# Patient Record
Sex: Male | Born: 1935 | Race: White | Hispanic: No | Smoking: Former smoker
Health system: Southern US, Community
[De-identification: ages and names within clinical notes are randomized; demographics above are authoritative.]

## PROBLEM LIST (undated history)

## (undated) DIAGNOSIS — R310 Gross hematuria: Secondary | ICD-10-CM

## (undated) DIAGNOSIS — G7000 Myasthenia gravis without (acute) exacerbation: Secondary | ICD-10-CM

## (undated) DIAGNOSIS — E7800 Pure hypercholesterolemia, unspecified: Secondary | ICD-10-CM

## (undated) DIAGNOSIS — R42 Dizziness and giddiness: Secondary | ICD-10-CM

## (undated) DIAGNOSIS — R9389 Abnormal findings on diagnostic imaging of other specified body structures: Secondary | ICD-10-CM

## (undated) DIAGNOSIS — I10 Essential (primary) hypertension: Secondary | ICD-10-CM

## (undated) DIAGNOSIS — B019 Varicella without complication: Secondary | ICD-10-CM

## (undated) DIAGNOSIS — C679 Malignant neoplasm of bladder, unspecified: Secondary | ICD-10-CM

## (undated) DIAGNOSIS — I719 Aortic aneurysm of unspecified site, without rupture: Secondary | ICD-10-CM

## (undated) DIAGNOSIS — B029 Zoster without complications: Secondary | ICD-10-CM

## (undated) DIAGNOSIS — G7080 Lambert-Eaton syndrome, unspecified: Secondary | ICD-10-CM

## (undated) DIAGNOSIS — I959 Hypotension, unspecified: Secondary | ICD-10-CM

## (undated) DIAGNOSIS — I340 Nonrheumatic mitral (valve) insufficiency: Secondary | ICD-10-CM

## (undated) DIAGNOSIS — N411 Chronic prostatitis: Secondary | ICD-10-CM

## (undated) DIAGNOSIS — I509 Heart failure, unspecified: Secondary | ICD-10-CM

## (undated) DIAGNOSIS — I071 Rheumatic tricuspid insufficiency: Secondary | ICD-10-CM

## (undated) DIAGNOSIS — I6523 Occlusion and stenosis of bilateral carotid arteries: Secondary | ICD-10-CM

## (undated) DIAGNOSIS — D1722 Benign lipomatous neoplasm of skin and subcutaneous tissue of left arm: Secondary | ICD-10-CM

## (undated) DIAGNOSIS — T7840XA Allergy, unspecified, initial encounter: Secondary | ICD-10-CM

## (undated) DIAGNOSIS — I209 Angina pectoris, unspecified: Secondary | ICD-10-CM

## (undated) DIAGNOSIS — K635 Polyp of colon: Secondary | ICD-10-CM

## (undated) DIAGNOSIS — R972 Elevated prostate specific antigen [PSA]: Secondary | ICD-10-CM

## (undated) DIAGNOSIS — C801 Malignant (primary) neoplasm, unspecified: Secondary | ICD-10-CM

## (undated) DIAGNOSIS — I2510 Atherosclerotic heart disease of native coronary artery without angina pectoris: Secondary | ICD-10-CM

## (undated) DIAGNOSIS — D649 Anemia, unspecified: Secondary | ICD-10-CM

## (undated) HISTORY — DX: Aortic aneurysm of unspecified site, without rupture: I71.9

## (undated) HISTORY — DX: Angina pectoris, unspecified: I20.9

## (undated) HISTORY — DX: Zoster without complications: B02.9

## (undated) HISTORY — DX: Myasthenia gravis without (acute) exacerbation: G70.00

## (undated) HISTORY — DX: Malignant (primary) neoplasm, unspecified: C80.1

## (undated) HISTORY — DX: Malignant neoplasm of bladder, unspecified: C67.9

## (undated) HISTORY — DX: Allergy, unspecified, initial encounter: T78.40XA

## (undated) HISTORY — DX: Elevated prostate specific antigen (PSA): R97.20

## (undated) HISTORY — DX: Varicella without complication: B01.9

## (undated) HISTORY — DX: Polyp of colon: K63.5

## (undated) HISTORY — DX: Essential (primary) hypertension: I10

## (undated) HISTORY — DX: Chronic prostatitis: N41.1

## (undated) HISTORY — DX: Lambert-Eaton syndrome, unspecified: G70.80

## (undated) HISTORY — DX: Pure hypercholesterolemia, unspecified: E78.00

## (undated) HISTORY — DX: Benign lipomatous neoplasm of skin and subcutaneous tissue of left arm: D17.22

## (undated) HISTORY — DX: Hypotension, unspecified: I95.9

## (undated) HISTORY — DX: Rheumatic tricuspid insufficiency: I07.1

## (undated) HISTORY — DX: Dizziness and giddiness: R42

## (undated) HISTORY — DX: Occlusion and stenosis of bilateral carotid arteries: I65.23

## (undated) HISTORY — DX: Atherosclerotic heart disease of native coronary artery without angina pectoris: I25.10

## (undated) HISTORY — DX: Nonrheumatic mitral (valve) insufficiency: I34.0

## (undated) HISTORY — DX: Anemia, unspecified: D64.9

## (undated) HISTORY — DX: Heart failure, unspecified: I50.9

## (undated) HISTORY — DX: Gross hematuria: R31.0

## (undated) HISTORY — DX: Abnormal findings on diagnostic imaging of other specified body structures: R93.89

---
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
## 1968-09-29 HISTORY — PX: HERNIA REPAIR: SHX51

## 1977-09-29 HISTORY — PX: CHOLECYSTECTOMY: SHX55

## 2004-12-10 ENCOUNTER — Ambulatory Visit: Payer: Self-pay | Admitting: Internal Medicine

## 2005-06-13 ENCOUNTER — Ambulatory Visit: Payer: Self-pay | Admitting: Internal Medicine

## 2006-04-02 ENCOUNTER — Ambulatory Visit: Payer: Self-pay | Admitting: Internal Medicine

## 2006-04-06 ENCOUNTER — Ambulatory Visit: Payer: Self-pay | Admitting: Gastroenterology

## 2011-03-31 DIAGNOSIS — G7000 Myasthenia gravis without (acute) exacerbation: Secondary | ICD-10-CM

## 2011-03-31 DIAGNOSIS — G7080 Lambert-Eaton syndrome, unspecified: Secondary | ICD-10-CM | POA: Insufficient documentation

## 2011-03-31 HISTORY — DX: Myasthenia gravis without (acute) exacerbation: G70.00

## 2011-04-02 DIAGNOSIS — I209 Angina pectoris, unspecified: Secondary | ICD-10-CM

## 2011-04-02 DIAGNOSIS — I10 Essential (primary) hypertension: Secondary | ICD-10-CM | POA: Insufficient documentation

## 2011-04-02 HISTORY — DX: Angina pectoris, unspecified: I20.9

## 2011-04-02 HISTORY — DX: Essential (primary) hypertension: I10

## 2011-09-30 DIAGNOSIS — B029 Zoster without complications: Secondary | ICD-10-CM

## 2011-09-30 HISTORY — DX: Zoster without complications: B02.9

## 2011-11-04 ENCOUNTER — Ambulatory Visit: Payer: Self-pay | Admitting: Gastroenterology

## 2011-11-05 ENCOUNTER — Ambulatory Visit: Payer: Self-pay | Admitting: Gastroenterology

## 2011-11-28 DIAGNOSIS — B029 Zoster without complications: Secondary | ICD-10-CM

## 2011-11-28 HISTORY — DX: Zoster without complications: B02.9

## 2012-06-22 LAB — LIPID PANEL: Triglycerides: 153 mg/dL (ref 40–160)

## 2012-08-30 ENCOUNTER — Encounter: Payer: Self-pay | Admitting: Internal Medicine

## 2012-08-30 ENCOUNTER — Ambulatory Visit: Payer: Self-pay | Admitting: Internal Medicine

## 2012-08-30 ENCOUNTER — Ambulatory Visit (INDEPENDENT_AMBULATORY_CARE_PROVIDER_SITE_OTHER): Payer: Medicare Other | Admitting: Internal Medicine

## 2012-08-30 VITALS — BP 120/72 | HR 71 | Temp 98.0°F | Ht 68.5 in | Wt 199.8 lb

## 2012-08-30 DIAGNOSIS — R5381 Other malaise: Secondary | ICD-10-CM

## 2012-08-30 DIAGNOSIS — R300 Dysuria: Secondary | ICD-10-CM

## 2012-08-30 DIAGNOSIS — R5383 Other fatigue: Secondary | ICD-10-CM

## 2012-08-30 DIAGNOSIS — D649 Anemia, unspecified: Secondary | ICD-10-CM | POA: Insufficient documentation

## 2012-08-30 DIAGNOSIS — G7080 Lambert-Eaton syndrome, unspecified: Secondary | ICD-10-CM

## 2012-08-30 DIAGNOSIS — E7800 Pure hypercholesterolemia, unspecified: Secondary | ICD-10-CM | POA: Insufficient documentation

## 2012-08-30 HISTORY — DX: Lambert-Eaton syndrome, unspecified: G70.80

## 2012-08-30 LAB — COMPREHENSIVE METABOLIC PANEL
AST: 19 U/L (ref 0–37)
Albumin: 3.9 g/dL (ref 3.5–5.2)
BUN: 16 mg/dL (ref 6–23)
Calcium: 8.9 mg/dL (ref 8.4–10.5)
Chloride: 101 mEq/L (ref 96–112)
Creatinine, Ser: 0.9 mg/dL (ref 0.4–1.5)
GFR: 84.37 mL/min (ref >60.00)
Glucose, Bld: 79 mg/dL (ref 70–99)
Potassium: 4 mEq/L (ref 3.5–5.1)

## 2012-08-30 LAB — POCT URINALYSIS DIPSTICK
Bilirubin, UA: NEGATIVE
Glucose, UA: NEGATIVE
Nitrite, UA: NEGATIVE
Spec Grav, UA: 1.025

## 2012-08-30 LAB — CBC WITH DIFFERENTIAL/PLATELET
Basophils Relative: 0.8 % (ref 0.0–3.0)
Eosinophils Absolute: 0.1 10*3/uL (ref 0.0–0.7)
Eosinophils Relative: 1.5 % (ref 0.0–5.0)
Hemoglobin: 14 g/dL (ref 13.0–17.0)
Lymphocytes Relative: 15.1 % (ref 12.0–46.0)
MCHC: 32.3 g/dL (ref 30.0–36.0)
MCV: 90.8 fl (ref 78.0–100.0)
Neutro Abs: 4.3 10*3/uL (ref 1.4–7.7)
RBC: 4.76 Mil/uL (ref 4.22–5.81)
WBC: 5.7 10*3/uL (ref 4.5–10.5)

## 2012-08-30 MED ORDER — CIPROFLOXACIN HCL 500 MG PO TABS: 500.0000 mg | ORAL_TABLET | Freq: Two times a day (BID) | ORAL | Status: DC

## 2012-08-30 NOTE — Assessment & Plan Note (Signed)
Low cholesterol diet and exercise.  Not on cholesterol medication now.  Follow.     

## 2012-08-30 NOTE — Assessment & Plan Note (Signed)
Sees Dr Howard.  Continue Mestinon and Cellcept.  Follow cbc.  Currently stable.   

## 2012-08-30 NOTE — Progress Notes (Signed)
Subjective:    Patient ID: [name], male    DOB: March 14, 1926, 78 y.o.   MRN: [id number]  HPI 78 year old male with past history of hypercholesterolemia, Eaton-Lamberts with Myasthenia Gravis and anemia who comes in today to follow up on these issues as well as for a complete physical exam.  He states he is doing relatively well.  Still seeing Dr Dorcas Mcmurray.  Using prednisone eye drops.  Had shingles involving his eye.  Is gradually improving.  Has follow up with him 10/05/12.  States that over the last 2-3 weeks, he has noticed some lower abdominal discomfort and burning with urination.  No fever or chills.  No nausea or vomiting.  No bowel change.  The discomfort is better, but he is still having the dysuria.    Past Medical History  Diagnosis Date  . Hypercholesterolemia   . Anemia   . Essential hypertension, benign   . Eaton-Lambert syndrome   . Myasthenia gravis   . Chicken pox   . Allergy   . Arthritis   . Urine incontinence   . Colon polyp     Review of Systems Patient denies any headache, lightheadedness or dizziness.  No significant sinus or allergy symptoms.  No chest pain, tightness or palpitations.  No increased shortness of breath, cough or congestion.  No nausea or vomiting.  No significant abdominal pain or cramping currently.  No bowel change, such as diarrhea, constipation, BRBPR or melana.  Does report dysuria as outlined.  No hematuria.  No urinary hesitancy.          Objective:   Physical Exam Filed Vitals:   08/30/12 0908  BP: 120/72  Pulse: 71  Temp: 98 F (55.28 C)   78 year old male in no acute distress.  HEENT:  Nares - clear.  Oropharynx - without lesions. NECK:  Supple.  Nontender.  No audible carotid bruit.  HEART:  Appears to be regular.   LUNGS:  No crackles or wheezing audible.  Respirations even and unlabored.   RADIAL PULSE:  Equal bilaterally.  ABDOMEN:  Soft.  Nontender.  Bowel sounds present and normal.  No audible abdominal bruit.     RECTAL:  Could not appreciate any palpable prostate nodules.  Prostate enlarged, minimal tenderness.  Heme negative.   EXTREMITIES:  No increased edema present.  DP pulses palpable and equal bilaterally.          Assessment & Plan:  GU.  Sees Dr Achilles Dunk.  Had the previous episode of hematuria.  Occurred on one occasion.  Sees Dr Achilles Dunk regularly.  Last u/a negative.  Dr Achilles Dunk following PSA and prostate checks.  With the dysuria and discomfort and exam findings - concerned about possible prostatitis.  Will check u/a.  Treat with cipro 500mg  bid x 10 days.  Follow closely.  Probiotic while on the abx.    CARDIOVASCULAR.  Stress test 01/18/09 revealed no evidence of ischemia.  EF 48%.  Frequent PVCs.  ECHO 01/18/09 revealed EF 50% with mild biatrial enlargement and mitral and tricuspid insufficiency.  Currently asymptomatic.    PULMONARY.  01/09/09 CXR revealed no acute infiltrate.  Scar at the left base.  Breathing has been stable.  Have discussed w/up for sleep apnea.  He declines.  Discussed with him regarding follow up CXR.  He wants to hold at this point.  Follow.    OPTHALMOLOGY.  Had shingles that involved his eye.  Seeing Dr Dorcas Mcmurray.  Using steroid eye drops.  Has  improved.  Continue follow up with Dr Dorcas Mcmurray.   HEALTH MAINTENANCE.  Physical today.  GU and prostate checks are done through Dr Cope's office.  Colonoscopy 11/05/11 revealed two hyperplastic rectal polyps and diverticulosis.

## 2012-08-30 NOTE — Assessment & Plan Note (Signed)
Check cbc to confirm normal/stable.

## 2012-08-30 NOTE — Patient Instructions (Signed)
It was nice seeing you today.  I am going to check your urine and some labs today - given the increased fatigue.  I am also going to start an antibiotic (cipro) - take twice a day - for prostate infection.  Let me know if you have persistent problems.

## 2012-09-15 ENCOUNTER — Telehealth: Payer: Self-pay | Admitting: Internal Medicine

## 2012-09-15 NOTE — Telephone Encounter (Signed)
Patient Information:  Caller Name: Jerrye Beavers  Phone: [phone]  Patient: [name], [name]  Gender: Male  DOB: 1926/08/12  Age: 78 Years  PCP: Dale Braden  Office Follow Up:  Does the office need to follow up with this patient?: Yes  Instructions For The Office: wants to talk to MD  RN Note:  Denies any trauma.  He is outside working today.  Just concerned that after every urination he has a bloody spot on his underwear.  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  Symptoms  Reason For Call & Symptoms: Has blood in his underwear after urinating only.  None seen during or after urination or a bm.  Has fatigue.  He completed medication for a UTI and completed antibiotics for same.  Reviewed Health History In EMR: Yes  Reviewed Medications In EMR: Yes  Reviewed Allergies In EMR: Yes  Reviewed Surgeries / Procedures: Yes  Date of Onset of Symptoms: 09/13/2012  Guideline(s) Used:  Urination Pain - Male  Disposition Per Guideline:   See Today in Office  Reason For Disposition Reached:   All other males with painful urination, or patient wants to be seen  Advice Given:  Fluids  : Drink extra fluids (Reason: to produce a dilute, nonirritating urine).  Call Back If:  You become worse.  Patient Refused Recommendation:  Patient Will Follow Up With Office Later  Wants to speak with Dr. Lorin Picket personally.

## 2012-09-15 NOTE — Telephone Encounter (Signed)
Still having problems with prostate and also having pain with blood in underpants. Finished antibiotics last week. Willing to see urinologist. Told patient we would call he back.

## 2012-09-16 ENCOUNTER — Encounter: Payer: Self-pay | Admitting: Internal Medicine

## 2012-09-16 ENCOUNTER — Ambulatory Visit (INDEPENDENT_AMBULATORY_CARE_PROVIDER_SITE_OTHER): Payer: Medicare Other | Admitting: Internal Medicine

## 2012-09-16 VITALS — BP 130/70 | HR 57 | Ht 68.5 in | Wt 197.5 lb

## 2012-09-16 DIAGNOSIS — R109 Unspecified abdominal pain: Secondary | ICD-10-CM

## 2012-09-16 DIAGNOSIS — R319 Hematuria, unspecified: Secondary | ICD-10-CM

## 2012-09-16 LAB — URINALYSIS, ROUTINE W REFLEX MICROSCOPIC
Bilirubin Urine: NEGATIVE
Ketones, ur: NEGATIVE
Nitrite: NEGATIVE
Total Protein, Urine: NEGATIVE

## 2012-09-16 LAB — CBC WITH DIFFERENTIAL/PLATELET
Basophils Relative: 0.6 % (ref 0.0–3.0)
Eosinophils Absolute: 0.1 10*3/uL (ref 0.0–0.7)
Hemoglobin: 14.2 g/dL (ref 13.0–17.0)
Lymphocytes Relative: 23.8 % (ref 12.0–46.0)
MCHC: 32.3 g/dL (ref 30.0–36.0)
MCV: 89.6 fl (ref 78.0–100.0)
Monocytes Absolute: 0.4 10*3/uL (ref 0.1–1.0)
Neutro Abs: 2.8 10*3/uL (ref 1.4–7.7)
RBC: 4.9 Mil/uL (ref 4.22–5.81)

## 2012-09-16 LAB — BASIC METABOLIC PANEL
BUN: 15 mg/dL (ref 6–23)
Chloride: 102 mEq/L (ref 96–112)
Creatinine, Ser: 0.9 mg/dL (ref 0.4–1.5)
Glucose, Bld: 113 mg/dL — ABNORMAL HIGH (ref 70–99)

## 2012-09-16 MED ORDER — CIPROFLOXACIN HCL 500 MG PO TABS: 500.0000 mg | ORAL_TABLET | Freq: Two times a day (BID) | ORAL | Status: DC

## 2012-09-16 NOTE — Telephone Encounter (Signed)
See if [name] can come today at 9:30

## 2012-09-18 LAB — CULTURE, URINE COMPREHENSIVE
Colony Count: NO GROWTH
Organism ID, Bacteria: NO GROWTH

## 2012-09-19 ENCOUNTER — Other Ambulatory Visit: Payer: Self-pay | Admitting: Internal Medicine

## 2012-09-19 ENCOUNTER — Encounter: Payer: Self-pay | Admitting: Internal Medicine

## 2012-09-19 ENCOUNTER — Telehealth: Payer: Self-pay | Admitting: Internal Medicine

## 2012-09-19 DIAGNOSIS — D696 Thrombocytopenia, unspecified: Secondary | ICD-10-CM

## 2012-09-19 NOTE — Telephone Encounter (Signed)
Pt notified of labs and need for repeat cbc.  Is planning to come in 09/24/12 at 11:00.  Pt aware.  Please put on schedule.

## 2012-09-19 NOTE — Progress Notes (Signed)
Follow up cbc ordered.

## 2012-09-19 NOTE — Progress Notes (Signed)
Subjective:    Patient ID: [name], male    DOB: September 21, 1926, 78 y.o.   MRN: [id number]  HPI 78 year old male with past history of hypercholesterolemia, Eaton-Lamberts with Myasthenia Gravis and anemia who comes in today for a scheduled follow up.  Last visit, he was having some dysuria.  Some concerns regarding possible prostatitis.  He still describes some lower abdominal discomfort and increased urinary frequency.  Two days ago - noticed some blood in his underwear (3-4 spots) - in the am. No blood today.  No further dysuria.  Bowels ok.  No constipation or diarrhea.  No blood in the stool. Was on cipro.  Noticed some improvement on the cipro.   Past Medical History  Diagnosis Date  . Hypercholesterolemia   . Anemia   . Essential hypertension, benign   . Eaton-Lambert syndrome   . Myasthenia gravis   . Chicken pox   . Allergy   . Colon polyp     Current Outpatient Prescriptions on File Prior to Visit  Medication Sig Dispense Refill  . aspirin 81 MG tablet Take 81 mg by mouth daily.      . finasteride (PROSCAR) 5 MG tablet Take 5 mg by mouth daily.      . mycophenolate (CELLCEPT) 250 MG capsule Take 250 mg by mouth.      . pyridostigmine (MESTINON) 60 MG tablet Take 120 mg by mouth daily.        Review of Systems Patient denies any headache, lightheadedness or dizziness.  No significant sinus or allergy symptoms.  No chest pain, tightness or palpitations.  No increased shortness of breath, cough or congestion.  No nausea or vomiting.  No significant abdominal pain or cramping currently.  Does report the lower abdominal discomfort as outlined.  Eating and drinking normally.   No bowel change, such as diarrhea, constipation, BRBPR or melana.  No dysuria.  No urinary hesitancy.  Does report the blood as outlined.         Objective:   Physical Exam  Filed Vitals:   09/16/12 0943  BP: 130/70  Pulse: 9   78 year old male in no acute distress.  HEENT:  Nares - clear.   Oropharynx - without lesions. NECK:  Supple.  Nontender.  HEART:  Appears to be regular.   LUNGS:  No crackles or wheezing audible.  Respirations even and unlabored.   RADIAL PULSE:  Equal bilaterally.  ABDOMEN:  Soft.  Nontender.  Bowel sounds present and normal.  No audible abdominal bruit. GU: normal descended testicles.  Non tender.  No palpable nodules.  No penile lesions.    RECTAL:  Could not appreciate any palpable prostate nodules.  Prostate enlarged, minimal tenderness.  Heme negative.   EXTREMITIES:  No increased edema present.  DP pulses palpable and equal bilaterally.          Assessment & Plan:  GU.  Sees Dr Achilles Dunk.  Had the previous episode of hematuria.  Occurred on one occasion.  Sees Dr Achilles Dunk regularly.  Dr Achilles Dunk has been following PSA and prostate checks.  With the lower abdominal discomfort and now blood in his underwear, will recheck u/a and culture.  Extend out the Cipro.  Schedule a follow up appt with Dr Achilles Dunk for hematuria and lower abdominal discomfort.    CARDIOVASCULAR.  Stress test 01/18/09 revealed no evidence of ischemia.  EF 48%.  Frequent PVCs.  ECHO 01/18/09 revealed EF 50% with mild biatrial enlargement and mitral and tricuspid  insufficiency.  Currently asymptomatic.    PULMONARY.  01/09/09 CXR revealed no acute infiltrate.  Scar at the left base.  Breathing has been stable.  Have discussed w/up for sleep apnea.  He declines.  Discussed with him regarding follow up CXR.  He wants to hold at this point.  Follow.    OPTHALMOLOGY.  Had shingles that involved his eye.  Seeing Dr Dorcas Mcmurray.  Using steroid eye drops.  Has improved.  Continue follow up with Dr Dorcas Mcmurray.   HEALTH MAINTENANCE.  Physical last visit.  GU and prostate checks are done through Dr Cope's office.  Colonoscopy 11/05/11 revealed two hyperplastic rectal polyps and diverticulosis.

## 2012-09-20 ENCOUNTER — Telehealth: Payer: Self-pay | Admitting: Internal Medicine

## 2012-09-20 NOTE — Telephone Encounter (Signed)
scheduled

## 2012-09-20 NOTE — Telephone Encounter (Signed)
Pt called and wanted to let you know that he reschedule appointment with dr cope to 1/3 @ 9:15 in [place] office.  Dr cope office cancel appointment for today in the [facility] office

## 2012-09-24 ENCOUNTER — Other Ambulatory Visit (INDEPENDENT_AMBULATORY_CARE_PROVIDER_SITE_OTHER): Payer: Medicare Other

## 2012-09-24 DIAGNOSIS — D696 Thrombocytopenia, unspecified: Secondary | ICD-10-CM

## 2012-09-24 LAB — CBC WITH DIFFERENTIAL/PLATELET
Basophils Absolute: 0 10*3/uL (ref 0.0–0.1)
HCT: 44.1 % (ref 39.0–52.0)
Lymphs Abs: 0.9 10*3/uL (ref 0.7–4.0)
Monocytes Absolute: 0.4 10*3/uL (ref 0.1–1.0)
Monocytes Relative: 9.4 % (ref 3.0–12.0)
Neutrophils Relative %: 66.8 % (ref 43.0–77.0)
Platelets: 147 10*3/uL — ABNORMAL LOW (ref 150.0–400.0)
RDW: 14.6 % (ref 11.5–14.6)

## 2012-09-25 ENCOUNTER — Other Ambulatory Visit: Payer: Self-pay | Admitting: Internal Medicine

## 2012-09-25 DIAGNOSIS — D696 Thrombocytopenia, unspecified: Secondary | ICD-10-CM

## 2012-09-25 NOTE — Progress Notes (Signed)
Order placed for follow up labs 

## 2012-09-30 DIAGNOSIS — Z8042 Family history of malignant neoplasm of prostate: Secondary | ICD-10-CM | POA: Insufficient documentation

## 2012-09-30 DIAGNOSIS — N411 Chronic prostatitis: Secondary | ICD-10-CM | POA: Insufficient documentation

## 2012-09-30 DIAGNOSIS — R972 Elevated prostate specific antigen [PSA]: Secondary | ICD-10-CM | POA: Insufficient documentation

## 2012-09-30 DIAGNOSIS — R310 Gross hematuria: Secondary | ICD-10-CM | POA: Insufficient documentation

## 2012-09-30 DIAGNOSIS — R339 Retention of urine, unspecified: Secondary | ICD-10-CM | POA: Insufficient documentation

## 2012-09-30 HISTORY — DX: Chronic prostatitis: N41.1

## 2012-09-30 HISTORY — DX: Elevated prostate specific antigen (PSA): R97.20

## 2012-09-30 HISTORY — DX: Gross hematuria: R31.0

## 2012-10-21 ENCOUNTER — Telehealth: Payer: Self-pay | Admitting: Internal Medicine

## 2012-10-21 NOTE — Telephone Encounter (Signed)
Called and spoke to [employer].  He updated me on work up.

## 2012-10-21 NOTE — Telephone Encounter (Signed)
Pt went to see dr cop   Pre op 1/27   biopsy 11/03/11 Pt spouse stated dr cop thought [name] has bladder cancer psa very elevated

## 2012-10-25 ENCOUNTER — Ambulatory Visit: Payer: Self-pay | Admitting: Urology

## 2012-10-25 DIAGNOSIS — I10 Essential (primary) hypertension: Secondary | ICD-10-CM

## 2012-10-29 ENCOUNTER — Other Ambulatory Visit: Payer: Medicare Other

## 2012-11-02 ENCOUNTER — Ambulatory Visit: Payer: Self-pay | Admitting: Urology

## 2012-11-03 LAB — PATHOLOGY REPORT

## 2012-11-09 ENCOUNTER — Other Ambulatory Visit: Payer: Medicare Other

## 2012-11-18 ENCOUNTER — Telehealth: Payer: Self-pay | Admitting: Internal Medicine

## 2012-11-18 NOTE — Telephone Encounter (Signed)
What blood test is he wanting.  Does he mean the usual labs we do - (cbc) or is there something specific that Dr Dimas Aguas wants.

## 2012-11-18 NOTE — Telephone Encounter (Signed)
Dr. Achilles Dunk is treating pt for a Bladder Cancer. Dr. Dimas Aguas at [facility] in Neuro has had pt on a medication that counter acts with the treatments that pt will be getting. Pt wants to know if he could get a lab test sometime Tuesday or Wednesday to see how his blood is doing without being on Cellcept???

## 2012-11-19 NOTE — Telephone Encounter (Signed)
Left message for patient to return call.

## 2012-11-22 NOTE — Telephone Encounter (Signed)
Appointment scheduled. Patient notified.

## 2012-11-22 NOTE — Telephone Encounter (Signed)
The cbc is already scheduled and he can come in tomorrow to get drawn.  Please schedule him an appt time.  Let me know if he needs anything more.

## 2012-11-22 NOTE — Telephone Encounter (Signed)
Spoke to patient about labs. Patient stated that he wants the labs that he has missed the last to times. Patient would like to get them done tomorrow.

## 2012-11-23 ENCOUNTER — Other Ambulatory Visit (INDEPENDENT_AMBULATORY_CARE_PROVIDER_SITE_OTHER): Payer: Medicare Other

## 2012-11-23 ENCOUNTER — Encounter: Payer: Self-pay | Admitting: Internal Medicine

## 2012-11-23 DIAGNOSIS — D696 Thrombocytopenia, unspecified: Secondary | ICD-10-CM

## 2012-11-23 LAB — CBC WITH DIFFERENTIAL/PLATELET
Basophils Relative: 0.7 % (ref 0.0–3.0)
Eosinophils Relative: 1.9 % (ref 0.0–5.0)
HCT: 43 % (ref 39.0–52.0)
Monocytes Relative: 8.4 % (ref 3.0–12.0)
Neutrophils Relative %: 67.9 % (ref 43.0–77.0)
Platelets: 196 10*3/uL (ref 150.0–400.0)
RBC: 4.8 Mil/uL (ref 4.22–5.81)
WBC: 4.8 10*3/uL (ref 4.5–10.5)

## 2012-11-24 DIAGNOSIS — C801 Malignant (primary) neoplasm, unspecified: Secondary | ICD-10-CM

## 2012-11-24 HISTORY — DX: Malignant (primary) neoplasm, unspecified: C80.1

## 2012-12-27 ENCOUNTER — Encounter: Payer: Self-pay | Admitting: Internal Medicine

## 2013-03-01 ENCOUNTER — Encounter: Payer: Self-pay | Admitting: Internal Medicine

## 2013-03-01 ENCOUNTER — Ambulatory Visit (INDEPENDENT_AMBULATORY_CARE_PROVIDER_SITE_OTHER): Payer: Medicare Other | Admitting: Internal Medicine

## 2013-03-01 VITALS — BP 120/64 | HR 98 | Temp 97.9°F | Ht 68.5 in | Wt 192.0 lb

## 2013-03-01 DIAGNOSIS — E7800 Pure hypercholesterolemia, unspecified: Secondary | ICD-10-CM

## 2013-03-01 DIAGNOSIS — G7080 Lambert-Eaton syndrome, unspecified: Secondary | ICD-10-CM

## 2013-03-01 DIAGNOSIS — D649 Anemia, unspecified: Secondary | ICD-10-CM

## 2013-03-01 DIAGNOSIS — C679 Malignant neoplasm of bladder, unspecified: Secondary | ICD-10-CM

## 2013-03-05 ENCOUNTER — Encounter: Payer: Self-pay | Admitting: Internal Medicine

## 2013-03-05 DIAGNOSIS — C679 Malignant neoplasm of bladder, unspecified: Secondary | ICD-10-CM

## 2013-03-05 DIAGNOSIS — Z8551 Personal history of malignant neoplasm of bladder: Secondary | ICD-10-CM | POA: Insufficient documentation

## 2013-03-05 HISTORY — DX: Malignant neoplasm of bladder, unspecified: C67.9

## 2013-03-05 NOTE — Assessment & Plan Note (Signed)
Seeing Dr Achilles Dunk.  Treated.  Just had f/u cystoscopy.  Scar tissue.  Has f/u planned 9/14.

## 2013-03-05 NOTE — Assessment & Plan Note (Signed)
Follow cbc to confirm normal/stable.   

## 2013-03-05 NOTE — Progress Notes (Signed)
Subjective:    Patient ID: [name], male    DOB: 14-Jul-1926, 79 y.o.   MRN: [id number]  HPI 79 year old male with past history of hypercholesterolemia, Eaton-Lamberts with Myasthenia Gravis and anemia who comes in today for a scheduled follow up. Has been recently diagnosed with bladder cancer.  S/P treatment.  Had to be of his cellcept when he was undergoing treatment for his bladder.  Back on his regular meds now.  Doing better.  Seeing Dr Achilles Dunk.  Had a follow up cystoscopy last week.  Revealed some scar tissue.  PSA also obtained - 1.1.  Has follow up with Dr Achilles Dunk 9/14.  Saw Dr Dimas Aguas 3/14.  Doing better.  Breathing stable.  No swallowing problems.  Eating and drinking well.  Bowels stable.     Past Medical History  Diagnosis Date  . Hypercholesterolemia   . Anemia   . Essential hypertension, benign   . Eaton-Lambert syndrome   . Myasthenia gravis   . Chicken pox   . Allergy   . Colon polyp     Current Outpatient Prescriptions on File Prior to Visit  Medication Sig Dispense Refill  . aspirin 81 MG tablet Take 81 mg by mouth daily.      . finasteride (PROSCAR) 5 MG tablet Take 5 mg by mouth daily.      . mycophenolate (CELLCEPT) 250 MG capsule Take 250 mg by mouth.      . pyridostigmine (MESTINON) 60 MG tablet Take 120 mg by mouth daily.       No current facility-administered medications on file prior to visit.    Review of Systems Patient denies any headache, lightheadedness or dizziness.  No significant sinus or allergy symptoms.  No chest pain, tightness or palpitations.  No increased shortness of breath, cough or congestion.  Feels his breathing is stable.  No nausea or vomiting.  No significant abdominal pain or cramping.  Eating and drinking normally.   No bowel change, such as diarrhea, constipation, BRBPR or melana.  No dysuria.  No urinary hesitancy.  Off abx.  Back on his regular meds.        Objective:   Physical Exam  Filed Vitals:   03/01/13 1347  BP: 120/64   Pulse: 98  Temp: 97.9 F (65.22 C)   79 year old male in no acute distress.  HEENT:  Nares - clear.  Oropharynx - without lesions. NECK:  Supple.  Nontender.  HEART:  Appears to be regular.   LUNGS:  No crackles or wheezing audible.  Respirations even and unlabored.   RADIAL PULSE:  Equal bilaterally.  ABDOMEN:  Soft.  Nontender.  Bowel sounds present and normal.  No audible abdominal bruit.   EXTREMITIES:  No increased edema present.  DP pulses palpable and equal bilaterally.          Assessment & Plan:  GU.  Sees Dr Achilles Dunk.  Diagnosed with bladder cancer.  Treated.  Just had f/u cystoscopy.  Scar tissue present.  Due f/u 9/14.  Currently doing well.    CARDIOVASCULAR.  Stress test 01/18/09 revealed no evidence of ischemia.  EF 48%.  Frequent PVCs.  ECHO 01/18/09 revealed EF 50% with mild biatrial enlargement and mitral and tricuspid insufficiency.  Currently asymptomatic.    PULMONARY.  01/09/09 CXR revealed no acute infiltrate.  Scar at the left base.  Breathing has been stable.  Have discussed w/up for sleep apnea.  He declines.  Discussed with him regarding follow up CXR.  He wants to hold at this point.  Follow.    OPTHALMOLOGY.  Had shingles that involved his eye.  Seeing Dr Dorcas Mcmurray.  Using steroid eye drops.  Has improved.  Continue follow up with Dr Dorcas Mcmurray.   HEALTH MAINTENANCE.  Physical 08/30/12.  GU and prostate checks are done through Dr Cope's office.  Colonoscopy 11/05/11 revealed two hyperplastic rectal polyps and diverticulosis.

## 2013-03-05 NOTE — Assessment & Plan Note (Signed)
Sees Dr Dimas Aguas.  Continue Mestinon and Cellcept.  Follow cbc.  Currently stable.

## 2013-03-05 NOTE — Assessment & Plan Note (Signed)
Low cholesterol diet and exercise.  Not on cholesterol medication now.  Follow.

## 2013-06-20 ENCOUNTER — Telehealth: Payer: Self-pay | Admitting: *Deleted

## 2013-06-20 DIAGNOSIS — C679 Malignant neoplasm of bladder, unspecified: Secondary | ICD-10-CM

## 2013-06-20 DIAGNOSIS — G7080 Lambert-Eaton syndrome, unspecified: Secondary | ICD-10-CM

## 2013-06-20 DIAGNOSIS — D649 Anemia, unspecified: Secondary | ICD-10-CM

## 2013-06-20 DIAGNOSIS — E7800 Pure hypercholesterolemia, unspecified: Secondary | ICD-10-CM

## 2013-06-20 NOTE — Telephone Encounter (Signed)
Pt coming in for labs tomorrow. Needs order placed for: Free T4, TSH, & B12 (Send copy of results to Dr. Clarisa Kindred @ [facility])

## 2013-06-20 NOTE — Telephone Encounter (Signed)
Orders placed for labs

## 2013-06-21 ENCOUNTER — Other Ambulatory Visit (INDEPENDENT_AMBULATORY_CARE_PROVIDER_SITE_OTHER): Payer: Medicare Other

## 2013-06-21 DIAGNOSIS — D649 Anemia, unspecified: Secondary | ICD-10-CM

## 2013-06-21 DIAGNOSIS — G7080 Lambert-Eaton syndrome, unspecified: Secondary | ICD-10-CM

## 2013-06-21 LAB — CBC WITH DIFFERENTIAL/PLATELET
Basophils Absolute: 0 10*3/uL (ref 0.0–0.1)
Eosinophils Absolute: 0.1 10*3/uL (ref 0.0–0.7)
Hemoglobin: 13.7 g/dL (ref 13.0–17.0)
Lymphocytes Relative: 24.8 % (ref 12.0–46.0)
MCHC: 32.4 g/dL (ref 30.0–36.0)
MCV: 90.2 fl (ref 78.0–100.0)
Monocytes Absolute: 0.3 10*3/uL (ref 0.1–1.0)
Monocytes Relative: 7 % (ref 3.0–12.0)
Neutro Abs: 2.8 10*3/uL (ref 1.4–7.7)
Neutrophils Relative %: 66.5 % (ref 43.0–77.0)
Platelets: 157 10*3/uL (ref 150.0–400.0)
RBC: 4.67 Mil/uL (ref 4.22–5.81)
RDW: 15.2 % — ABNORMAL HIGH (ref 11.5–14.6)

## 2013-06-21 LAB — TSH: TSH: 1.81 u[IU]/mL (ref 0.35–5.50)

## 2013-06-21 LAB — T3, FREE: T3, Free: 2.8 pg/mL (ref 2.3–4.2)

## 2013-06-21 LAB — T4, FREE: Free T4: 0.87 ng/dL (ref 0.60–1.60)

## 2013-06-21 LAB — VITAMIN B12: Vitamin B-12: 424 pg/mL (ref 211–911)

## 2013-06-22 ENCOUNTER — Encounter: Payer: Self-pay | Admitting: *Deleted

## 2013-08-24 ENCOUNTER — Telehealth: Payer: Self-pay | Admitting: *Deleted

## 2013-08-24 DIAGNOSIS — D649 Anemia, unspecified: Secondary | ICD-10-CM

## 2013-08-24 DIAGNOSIS — E7800 Pure hypercholesterolemia, unspecified: Secondary | ICD-10-CM

## 2013-08-24 DIAGNOSIS — C679 Malignant neoplasm of bladder, unspecified: Secondary | ICD-10-CM

## 2013-08-24 NOTE — Telephone Encounter (Signed)
Order placed for labs.  Thanks   

## 2013-08-24 NOTE — Telephone Encounter (Signed)
Pt is coming in for labs Friday what labs and dx? 

## 2013-08-26 ENCOUNTER — Other Ambulatory Visit (INDEPENDENT_AMBULATORY_CARE_PROVIDER_SITE_OTHER): Payer: Medicare Other

## 2013-08-26 DIAGNOSIS — C679 Malignant neoplasm of bladder, unspecified: Secondary | ICD-10-CM

## 2013-08-26 DIAGNOSIS — D649 Anemia, unspecified: Secondary | ICD-10-CM

## 2013-08-26 DIAGNOSIS — E7800 Pure hypercholesterolemia, unspecified: Secondary | ICD-10-CM

## 2013-08-26 LAB — CBC WITH DIFFERENTIAL/PLATELET
Basophils Relative: 0.6 % (ref 0.0–3.0)
Eosinophils Absolute: 0.1 10*3/uL (ref 0.0–0.7)
Eosinophils Relative: 1.2 % (ref 0.0–5.0)
HCT: 42.8 % (ref 39.0–52.0)
Hemoglobin: 14.1 g/dL (ref 13.0–17.0)
MCHC: 33 g/dL (ref 30.0–36.0)
MCV: 90.1 fl (ref 78.0–100.0)
Monocytes Absolute: 0.4 10*3/uL (ref 0.1–1.0)
Neutro Abs: 3.6 10*3/uL (ref 1.4–7.7)
Neutrophils Relative %: 65.3 % (ref 43.0–77.0)
Platelets: 168 10*3/uL (ref 150.0–400.0)
RBC: 4.76 Mil/uL (ref 4.22–5.81)
WBC: 5.5 10*3/uL (ref 4.5–10.5)

## 2013-08-26 LAB — COMPREHENSIVE METABOLIC PANEL
AST: 14 U/L (ref 0–37)
Albumin: 3.9 g/dL (ref 3.5–5.2)
Alkaline Phosphatase: 55 U/L (ref 39–117)
BUN: 16 mg/dL (ref 6–23)
CO2: 32 mEq/L (ref 19–32)
Calcium: 9.1 mg/dL (ref 8.4–10.5)
GFR: 69.82 mL/min (ref >60.00)
Glucose, Bld: 94 mg/dL (ref 70–99)
Potassium: 4.5 mEq/L (ref 3.5–5.1)
Total Protein: 6.6 g/dL (ref 6.0–8.3)

## 2013-08-26 LAB — LDL CHOLESTEROL, DIRECT: Direct LDL: 143.5 mg/dL

## 2013-08-26 LAB — LIPID PANEL
Cholesterol: 213 mg/dL — ABNORMAL HIGH (ref 0–200)
HDL: 53.3 mg/dL (ref >39.00)

## 2013-08-31 ENCOUNTER — Encounter: Payer: Self-pay | Admitting: Internal Medicine

## 2013-08-31 ENCOUNTER — Ambulatory Visit (INDEPENDENT_AMBULATORY_CARE_PROVIDER_SITE_OTHER): Payer: Medicare Other | Admitting: Internal Medicine

## 2013-08-31 VITALS — BP 130/60 | HR 65 | Temp 97.7°F | Ht 68.5 in | Wt 197.2 lb

## 2013-08-31 DIAGNOSIS — Z8619 Personal history of other infectious and parasitic diseases: Secondary | ICD-10-CM

## 2013-08-31 DIAGNOSIS — D649 Anemia, unspecified: Secondary | ICD-10-CM

## 2013-08-31 DIAGNOSIS — L989 Disorder of the skin and subcutaneous tissue, unspecified: Secondary | ICD-10-CM

## 2013-08-31 DIAGNOSIS — G7080 Lambert-Eaton syndrome, unspecified: Secondary | ICD-10-CM

## 2013-08-31 DIAGNOSIS — E7800 Pure hypercholesterolemia, unspecified: Secondary | ICD-10-CM

## 2013-08-31 DIAGNOSIS — C679 Malignant neoplasm of bladder, unspecified: Secondary | ICD-10-CM

## 2013-08-31 NOTE — Progress Notes (Signed)
Pre-visit discussion using our clinic review tool. No additional management support is needed unless otherwise documented below in the visit note.  

## 2013-08-31 NOTE — Progress Notes (Signed)
Subjective:    Patient ID: [name], male    DOB: 12/27/1925, 79 y.o.   MRN: [id number]  HPI 79 year old male with past history of hypercholesterolemia, Eaton-Lamberts with Myasthenia Gravis and anemia who comes in today to follow up on these issues as well as for a complete physical exam.   Has bladder cancer.  S/P treatment.  Had to be off his cellcept when he was undergoing treatment for his bladder.  Back on his regular meds now.  Doing better.  Seeing Dr Achilles Dunk.  Had a follow up cystoscopy.  Revealed some scar tissue.  PSA also obtained - 1.1.  Last follow up with Dr Achilles Dunk 9/14.  Sees Dr Dimas Aguas.  Doing better.  Breathing stable.  No swallowing problems.  Eating and drinking well.  Bowels stable.  Still seeing opthalmology for his eye.  Had shingles.  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  Using drops.     Past Medical History  Diagnosis Date  . Hypercholesterolemia   . Anemia   . Essential hypertension, benign   . Eaton-Lambert syndrome   . Myasthenia gravis   . Chicken pox   . Allergy   . Colon polyp     Current Outpatient Prescriptions on File Prior to Visit  Medication Sig Dispense Refill  . aspirin 81 MG tablet Take 81 mg by mouth daily.      . finasteride (PROSCAR) 5 MG tablet Take 5 mg by mouth daily.      . mycophenolate (CELLCEPT) 250 MG capsule Take 250 mg by mouth.      . prednisoLONE acetate (PRED FORTE) 1 % ophthalmic suspension Place 1 drop into the right eye every other day.      . pyridostigmine (MESTINON) 60 MG tablet Take 120 mg by mouth daily.      . tamsulosin (FLOMAX) 0.4 MG CAPS Take 0.4 mg by mouth daily.       No current facility-administered medications on file prior to visit.    Review of Systems Patient denies any headache, lightheadedness or dizziness.  No significant sinus or allergy symptoms.  No chest pain, tightness or palpitations.  No increased shortness of breath, cough or congestion.  Feels his breathing is stable.  No nausea or vomiting.  No  significant abdominal pain or cramping.  Eating and drinking normally.   No bowel change, such as diarrhea, constipation, BRBPR or melana.  No dysuria.  No urinary hesitancy.  Has noticed a raised lesion left arm.  Non tender.  Not growing.       Objective:   Physical Exam  Filed Vitals:   08/31/13 0959  BP: 130/60  Pulse: 65  Temp: 97.7 F (87.30 C)   79 year old male in no acute distress.  HEENT:  Nares - clear.  Oropharynx - without lesions. NECK:  Supple.  Nontender.  No audible carotid bruit.  HEART:  Appears to be regular.   LUNGS:  No crackles or wheezing audible.  Respirations even and unlabored.   RADIAL PULSE:  Equal bilaterally.  ABDOMEN:  Soft.  Nontender.  Bowel sounds present and normal.  No audible abdominal bruit.  GU:  Performed by Dr Achilles Dunk.    EXTREMITIES:  No increased edema present.  DP pulses palpable and equal bilaterally.         Assessment & Plan:  GU.  Sees Dr Achilles Dunk.  Diagnosed with bladder cancer.  Treated.  Had f/u cystoscopy.  Scar tissue present.   Currently doing  well.    CARDIOVASCULAR.  Stress test 01/18/09 revealed no evidence of ischemia.  EF 48%.  Frequent PVCs.  ECHO 01/18/09 revealed EF 50% with mild biatrial enlargement and mitral and tricuspid insufficiency.  Currently asymptomatic.    PULMONARY.  01/09/09 CXR revealed no acute infiltrate.  Scar at the left base.  Breathing has been stable.  Have discussed w/up for sleep apnea.  He declines.  Discussed with him regarding follow up CXR.  He wants to hold at this point.  States Dr Dimas Aguas has performed cxr's previously.   Wants to discuss the need for cxr with Dr Dimas Aguas.    OPTHALMOLOGY.  Had shingles that involved his eye.  Seeing Dr [name].  Using steroid eye drops.  Has improved.  Continue follow up with Dr [name].   HEALTH MAINTENANCE.  Physical today.  GU and prostate checks are done through Dr Cope's office.  Colonoscopy 11/05/11 revealed two hyperplastic rectal polyps and diverticulosis.

## 2013-09-04 ENCOUNTER — Encounter: Payer: Self-pay | Admitting: Internal Medicine

## 2013-09-04 DIAGNOSIS — L989 Disorder of the skin and subcutaneous tissue, unspecified: Secondary | ICD-10-CM | POA: Insufficient documentation

## 2013-09-04 DIAGNOSIS — Z8619 Personal history of other infectious and parasitic diseases: Secondary | ICD-10-CM | POA: Insufficient documentation

## 2013-09-04 NOTE — Assessment & Plan Note (Signed)
Had eye involvement.  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  Seeing Dr Dingledein.       

## 2013-09-04 NOTE — Assessment & Plan Note (Signed)
Sees Dr Dimas Aguas.  Continue Mestinon and Cellcept.  Follow cbc.  Currently stable.

## 2013-09-04 NOTE — Assessment & Plan Note (Addendum)
Raised soft tissue lesion.  Superficial.  Mobile.  Non tender.  Offered further evaluation with possible biopsy to determine the etiology.  He agreed.  Schedule appt with general surgery for further evaluation.

## 2013-09-04 NOTE — Assessment & Plan Note (Signed)
Seeing Dr Achilles Dunk.  Treated.  Had f/u cystoscopy.  Scar tissue.  Stable.

## 2013-09-04 NOTE — Assessment & Plan Note (Signed)
Low cholesterol diet and exercise.  Not on cholesterol medication now.  Follow.

## 2013-09-04 NOTE — Assessment & Plan Note (Signed)
Follow cbc to confirm normal/stable.   

## 2013-10-04 ENCOUNTER — Ambulatory Visit (INDEPENDENT_AMBULATORY_CARE_PROVIDER_SITE_OTHER): Payer: Medicare Other | Admitting: General Surgery

## 2013-10-04 ENCOUNTER — Encounter: Payer: Self-pay | Admitting: General Surgery

## 2013-10-04 VITALS — BP 138/74 | HR 62 | Resp 16 | Ht 69.0 in | Wt 202.0 lb

## 2013-10-04 DIAGNOSIS — D1739 Benign lipomatous neoplasm of skin and subcutaneous tissue of other sites: Secondary | ICD-10-CM

## 2013-10-04 DIAGNOSIS — D1722 Benign lipomatous neoplasm of skin and subcutaneous tissue of left arm: Secondary | ICD-10-CM | POA: Insufficient documentation

## 2013-10-04 DIAGNOSIS — M799 Soft tissue disorder, unspecified: Secondary | ICD-10-CM

## 2013-10-04 DIAGNOSIS — M7989 Other specified soft tissue disorders: Secondary | ICD-10-CM

## 2013-10-04 HISTORY — DX: Benign lipomatous neoplasm of skin and subcutaneous tissue of left arm: D17.22

## 2013-10-04 NOTE — Progress Notes (Signed)
Patient ID: [name], male   DOB: [birth year], 80 y.o.   MRN: [id number]  Chief Complaint  Patient presents with  . Other    left arm mass    HPI [name] is a 80 y.o. male.  Here today for evaluation of left arm mass. States it has been there for about 2 months. States it has not changed in size and there is no pain. Left upper arm posterior side.  HPI  Past Medical History  Diagnosis Date  . Hypercholesterolemia   . Anemia   . Essential hypertension, benign   . Eaton-Lambert syndrome   . Myasthenia gravis   . Chicken pox   . Allergy   . Colon polyp   . Shingles 2013  . Cancer 11-24-12    bladder. BCG treatments by Dr Jacqlyn Larsen.    Past Surgical History  Procedure Laterality Date  . Appendectomy  1958  . Cholecystectomy  1979  . Tonsillectomy  1958  . Hernia repair  1970    Family History  Problem Relation Age of Onset  . Lung cancer Sister   . Prostate cancer Brother   . Colon cancer Neg Hx   . Lung cancer Brother   . Arthritis      parent    Social History History  Substance Use Topics  . Smoking status: Former Smoker    Quit date: 09/29/1958  . Smokeless tobacco: Never Used  . Alcohol Use: Yes     Comment: occasionally    Allergies  Allergen Reactions  . Sulfa Antibiotics Rash    Current Outpatient Prescriptions  Medication Sig Dispense Refill  . aspirin 81 MG tablet Take 81 mg by mouth daily.      . finasteride (PROSCAR) 5 MG tablet Take 5 mg by mouth daily.      . mycophenolate (CELLCEPT) 250 MG capsule Take 250 mg by mouth.      . pyridostigmine (MESTINON) 60 MG tablet Take 120 mg by mouth daily.      . tamsulosin (FLOMAX) 0.4 MG CAPS Take 0.4 mg by mouth daily.       No current facility-administered medications for this visit.    Review of Systems Review of Systems  Constitutional: Negative.   Respiratory: Negative.   Cardiovascular: Negative.     Blood pressure 138/74, pulse 62, resp. rate 16, height 5\' 9"  (1.753 m), weight  202 lb (91.627 kg).  Physical Exam Physical Exam  Constitutional: He is oriented to person, place, and time. He appears well-developed and well-nourished.  Musculoskeletal:       Arms: Neurological: He is alert and oriented to person, place, and time.  Skin: Skin is warm, dry and intact.  2.5 cm mobile soft tissue mass posterior medial left arm    Data Reviewed PCP Notes.  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 No fixation of the overall line skin or underlying muscle. No tenderness, erythema or induration.     Plan    Unless the area becomes symptomatic, enlarges or as otherwise troublesome, observation alone is warranted at this time.         Robert Bellow 10/04/2013, 9:30 PM

## 2013-10-04 NOTE — Patient Instructions (Signed)
The patient is aware to call back for any questions or concerns with changes in the left arm mass.

## 2014-01-20 ENCOUNTER — Telehealth: Payer: Self-pay | Admitting: Internal Medicine

## 2014-01-20 ENCOUNTER — Encounter: Payer: Self-pay | Admitting: Internal Medicine

## 2014-01-20 NOTE — Telephone Encounter (Signed)
Reviewed

## 2014-01-20 NOTE — Telephone Encounter (Signed)
Pt came in to office, dropped off records from [facility].  Placed in Dr. Bary Leriche box.   Pt also asked that previous labs be sent to Dr. Irven Easterly. Nadara Mustard at [facility].  Pt signed med release; labs faxed [phone]

## 2014-01-20 NOTE — Telephone Encounter (Signed)
Records placed in folder & copy of labs sent via epic to Dr. Nadara Mustard @ [facility]

## 2014-03-03 ENCOUNTER — Ambulatory Visit: Payer: Medicare Other | Admitting: Internal Medicine

## 2014-03-09 ENCOUNTER — Encounter: Payer: Self-pay | Admitting: Internal Medicine

## 2014-03-09 ENCOUNTER — Ambulatory Visit (INDEPENDENT_AMBULATORY_CARE_PROVIDER_SITE_OTHER): Payer: Medicare Other | Admitting: Internal Medicine

## 2014-03-09 VITALS — BP 120/58 | HR 69 | Temp 98.3°F | Ht 68.5 in | Wt 201.5 lb

## 2014-03-09 DIAGNOSIS — E7800 Pure hypercholesterolemia, unspecified: Secondary | ICD-10-CM

## 2014-03-09 DIAGNOSIS — D649 Anemia, unspecified: Secondary | ICD-10-CM

## 2014-03-09 DIAGNOSIS — C679 Malignant neoplasm of bladder, unspecified: Secondary | ICD-10-CM

## 2014-03-09 DIAGNOSIS — R9431 Abnormal electrocardiogram [ECG] [EKG]: Secondary | ICD-10-CM

## 2014-03-09 DIAGNOSIS — G7080 Lambert-Eaton syndrome, unspecified: Secondary | ICD-10-CM

## 2014-03-09 DIAGNOSIS — R0602 Shortness of breath: Secondary | ICD-10-CM

## 2014-03-09 NOTE — Progress Notes (Signed)
Pre visit review using our clinic review tool, if applicable. No additional management support is needed unless otherwise documented below in the visit note. 

## 2014-03-10 ENCOUNTER — Encounter: Payer: Self-pay | Admitting: Internal Medicine

## 2014-03-10 DIAGNOSIS — R9431 Abnormal electrocardiogram [ECG] [EKG]: Secondary | ICD-10-CM | POA: Insufficient documentation

## 2014-03-10 DIAGNOSIS — R0602 Shortness of breath: Secondary | ICD-10-CM | POA: Insufficient documentation

## 2014-03-10 DIAGNOSIS — E782 Mixed hyperlipidemia: Secondary | ICD-10-CM | POA: Insufficient documentation

## 2014-03-10 NOTE — Assessment & Plan Note (Signed)
Seeing Dr Jacqlyn Larsen.  Treated.

## 2014-03-10 NOTE — Progress Notes (Signed)
Subjective:    Patient ID: [name], male    DOB: 1925-10-14, 80 y.o.   MRN: [id number]  Breathing Problem He complains of difficulty breathing.  80 year old male with past history of hypercholesterolemia, Eaton-Lamberts with Myasthenia Gravis and anemia who comes in today as a work in with concerns regarding a change in his breathing.  Has bladder cancer.  S/P treatment.  Had to be off his cellcept when he was undergoing treatment for his bladder.  Back on his regular meds now.  States that for the past two weeks he has been waking up at night, feeling as if he can't get a good breath.  States that last week, he was on a fishing trip and did not sleep well - secondary to his breathing.  Reports his problem is mostly at night.  If he sleeps in a chair, symptoms improve.  He has been sleeping on two pillows and this helps.  No chest congestion or increased cough.  No increased nasal congestion.  No acid reflux.  Eating and drinking ok.  Bowels stable.  No chest pain or tightness with increased activity or exertion.  Some gradual worsening dyspnea on exertion.  Feels better this week then he did last week.     Past Medical History  Diagnosis Date  . Hypercholesterolemia   . Anemia   . Essential hypertension, benign   . Eaton-Lambert syndrome   . Myasthenia gravis   . Chicken pox   . Allergy   . Colon polyp   . Shingles 2013  . Cancer 11-24-12    bladder. BCG treatments by Dr Jacqlyn Larsen.    Current Outpatient Prescriptions on File Prior to Visit  Medication Sig Dispense Refill  . aspirin 81 MG tablet Take 81 mg by mouth daily.      . finasteride (PROSCAR) 5 MG tablet Take 5 mg by mouth daily.      . mycophenolate (CELLCEPT) 250 MG capsule Take 250 mg by mouth.      . pyridostigmine (MESTINON) 60 MG tablet Take 120 mg by mouth daily.      . tamsulosin (FLOMAX) 0.4 MG CAPS Take 0.4 mg by mouth daily.       No current facility-administered medications on file prior to visit.    Review of  Systems Patient denies any headache, lightheadedness or dizziness.  No significant sinus or allergy symptoms.  No chest pain, tightness or palpitations.  No increased cough or congestion.  Some gradual worsening dyspnea on exertion.  No nausea or vomiting.  No acid reflux.  Eating and drinking normally.   No bowel change, such as diarrhea, constipation, BRBPR or melana.  No dysuria.  No urinary hesitancy.  Trouble sleeping as outlined.   He is accompanied by his wife and history obtained from both of them.       Objective:   Physical Exam  Filed Vitals:   03/09/14 1502  BP: 120/58  Pulse: 69  Temp: 98.3 F (22.12 C)   80 year old male in no acute distress.  HEENT:  Nares - clear.  Oropharynx - without lesions. NECK:  Supple.  Nontender.  No audible carotid bruit.  HEART:  Appears to be regular.   LUNGS:  No crackles or wheezing audible.  Respirations even and unlabored.   RADIAL PULSE:  Equal bilaterally.  ABDOMEN:  Soft.  Nontender.  Bowel sounds present and normal.  No audible abdominal bruit.   EXTREMITIES:  No increased edema present.  Stable.  Assessment & Plan:  GU.  Sees Dr Jacqlyn Larsen.  Diagnosed with bladder cancer.    CARDIOVASCULAR.  Stress test 01/18/09 revealed no evidence of ischemia.  EF 48%.  Frequent PVCs.  ECHO 01/18/09 revealed EF 50% with mild biatrial enlargement and mitral and tricuspid insufficiency.  Symptoms as outlined.  Workup planned as outlined.     PULMONARY.  01/09/09 CXR revealed no acute infiltrate.  Scar at the left base.   Have previously discussed w/up for sleep apnea.  He previously declined.  Also previously discussed with him regarding follow up CXR.  He had declined.  Given symptoms, I do feel repeat cxr is warranted.  Also, needs sleep study.  Will arrange.    OPTHALMOLOGY.  Had shingles that involved his eye.  Seeing Dr Thomasene Ripple.  Using steroid eye drops.  Has improved.  Continue follow up with Dr Thomasene Ripple.   HEALTH MAINTENANCE.  Physical  08/31/13.  GU and prostate checks are done through Dr Cope's office.  Colonoscopy 11/05/11 revealed two hyperplastic rectal polyps and diverticulosis.    I spent 40 minutes with the patient and more than 50% of the time was spent in consultation regarding the above (specifically discussing the sob and doe and plan for further evaluation and w/up).

## 2014-03-10 NOTE — Assessment & Plan Note (Signed)
Follow cbc to confirm normal/stable.

## 2014-03-10 NOTE — Assessment & Plan Note (Signed)
See above.  Cardiac w/up as outlined.

## 2014-03-10 NOTE — Assessment & Plan Note (Signed)
Symptoms as outlined.  Symptoms appear to occur at night.  Better with sitting in his chair or sleeping on two pillows.  Some gradual worsening doe.  This has been over a long period of time.  No chest pain or tightness.  Discussed further w/up.  EKG revealed SR with TWI in I and aVL and in v2-v5 with flattening in v6.  I discussed these change with the patient and his wife.  We discussed ER evaluation this pm.  He reports feeling better this week when compared to last week.  Prefers not to go to ER this pm.  No acute symptoms currently.  Appears to be comfortable and in no acute distress.  Called cardiology.  Will see Dr Nehemiah Massed tomorrow.  Pt in agreement.  Will be evaluated this pm if any symptom change or problem.  Will obtain split night sleep study.  Avoid sedating medications.  Needs f/u cxr.

## 2014-03-10 NOTE — Assessment & Plan Note (Signed)
Sees Dr Nadara Mustard.  Continue Mestinon and Cellcept.  Follow cbc.  Unsure if breathing related.  Need to pursue sleep study and cardiac w/up as outlined.

## 2014-03-10 NOTE — Assessment & Plan Note (Signed)
Low cholesterol diet and exercise.  Not on cholesterol medication now.  He had previously declined to restart.  Follow.

## 2014-03-28 ENCOUNTER — Encounter: Payer: Self-pay | Admitting: Internal Medicine

## 2014-03-28 ENCOUNTER — Ambulatory Visit (INDEPENDENT_AMBULATORY_CARE_PROVIDER_SITE_OTHER): Payer: Medicare Other | Admitting: Internal Medicine

## 2014-03-28 VITALS — BP 110/60 | HR 67 | Temp 97.7°F | Ht 68.5 in | Wt 198.5 lb

## 2014-03-28 DIAGNOSIS — E7800 Pure hypercholesterolemia, unspecified: Secondary | ICD-10-CM

## 2014-03-28 DIAGNOSIS — Z8619 Personal history of other infectious and parasitic diseases: Secondary | ICD-10-CM

## 2014-03-28 DIAGNOSIS — G7080 Lambert-Eaton syndrome, unspecified: Secondary | ICD-10-CM

## 2014-03-28 DIAGNOSIS — D649 Anemia, unspecified: Secondary | ICD-10-CM

## 2014-03-28 DIAGNOSIS — C679 Malignant neoplasm of bladder, unspecified: Secondary | ICD-10-CM

## 2014-03-28 DIAGNOSIS — R0602 Shortness of breath: Secondary | ICD-10-CM

## 2014-04-01 ENCOUNTER — Encounter: Payer: Self-pay | Admitting: Internal Medicine

## 2014-04-01 NOTE — Assessment & Plan Note (Signed)
Had eye involvement.  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  Seeing Dr Thomasene Ripple.  Due f/u 04/06/14.

## 2014-04-01 NOTE — Assessment & Plan Note (Signed)
Low cholesterol diet and exercise.  Not on cholesterol medication now.  He had previously declined to restart.  Follow.

## 2014-04-01 NOTE — Progress Notes (Signed)
Subjective:    Patient ID: [name], male    DOB: May 28, 1926, 80 y.o.   MRN: [id number]  HPI 80 year old male with past history of hypercholesterolemia, Eaton-Lamberts with Myasthenia Gravis and anemia who comes in today for a scheduled follow up.  Has bladder cancer.  S/P treatment.  Had to be off his cellcept when he was undergoing treatment for his bladder.  Back on his regular meds now.  Seeing Dr Jacqlyn Larsen.  Last visit, noticed increased trouble breathing.  See last note for details.  Saw cardiology.  Was placed on lasix and isosorbide. States breathing is better now.  Due to f/u soon with cardiology.  No swallowing problems.  Eating and drinking well.  Bowels stable.  Still seeing opthalmology for his eye.  Had shingles.  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  Using drops.  Due to f/u with Dr Thomasene Ripple 04/06/14.     Past Medical History  Diagnosis Date  . Hypercholesterolemia   . Anemia   . Essential hypertension, benign   . Eaton-Lambert syndrome   . Myasthenia gravis   . Chicken pox   . Allergy   . Colon polyp   . Shingles 2013  . Cancer 11-24-12    bladder. BCG treatments by Dr Jacqlyn Larsen.    Current Outpatient Prescriptions on File Prior to Visit  Medication Sig Dispense Refill  . aspirin 81 MG tablet Take 81 mg by mouth daily.      . finasteride (PROSCAR) 5 MG tablet Take 5 mg by mouth daily.      . mycophenolate (CELLCEPT) 250 MG capsule Take 250 mg by mouth.      . pyridostigmine (MESTINON) 60 MG tablet Take 120 mg by mouth daily.      . tamsulosin (FLOMAX) 0.4 MG CAPS Take 0.4 mg by mouth daily.       No current facility-administered medications on file prior to visit.    Review of Systems Patient denies any headache, lightheadedness or dizziness.  No significant sinus or allergy symptoms.  No chest pain, tightness or palpitations.  Breathing is better.  No increased cough or congestion.   No nausea or vomiting.  No abdominal pain or cramping.  Eating and drinking normally.    No bowel change, such as diarrhea, constipation, BRBPR or melana.  No dysuria.  No urinary hesitancy.        Objective:   Physical Exam  Filed Vitals:   03/28/14 1508  BP: 110/60  Pulse: 67  Temp: 97.7 F (60.3 C)   80 year old male in no acute distress.  HEENT:  Nares - clear.  Oropharynx - without lesions. NECK:  Supple.  Nontender.  No audible carotid bruit.  HEART:  Appears to be regular.   LUNGS:  No wheezing audible.  Respirations even and unlabored.  Some dry crackles present.  RADIAL PULSE:  Equal bilaterally.  ABDOMEN:  Soft.  Nontender.  Bowel sounds present and normal.  No audible abdominal bruit.    EXTREMITIES:  No increased edema present.  DP pulses palpable and equal bilaterally.         Assessment & Plan:  GU.  Sees Dr Jacqlyn Larsen.  Diagnosed with bladder cancer.  Treated.  Had f/u cystoscopy.  Scar tissue present.     CARDIOVASCULAR.  Stress test 01/18/09 revealed no evidence of ischemia.  EF 48%.  Frequent PVCs.  ECHO 01/18/09 revealed EF 50% with mild biatrial enlargement and mitral and tricuspid insufficiency.  Just reevaluated  by cardiology.  Obtain most recent test results.  Was placed on lasix and isosorbide.    PULMONARY.  01/09/09 CXR revealed no acute infiltrate.  Scar at the left base.  Have discussed f/u cxr.  He has previously declined.  Agreed today.  Due to see cardiology tomorrow.  Will have them repeat cxr.     OPTHALMOLOGY.  Had shingles that involved his eye.  Seeing Dr Thomasene Ripple.  Using steroid eye drops.  Has improved.  Continue follow up with Dr Thomasene Ripple.   HEALTH MAINTENANCE.  Physical 08/31/13.  GU and prostate checks are done through Dr Cope's office.  Colonoscopy 11/05/11 revealed two hyperplastic rectal polyps and diverticulosis.

## 2014-04-01 NOTE — Assessment & Plan Note (Signed)
Following with Dr Jacqlyn Larsen.

## 2014-04-01 NOTE — Assessment & Plan Note (Signed)
Follow cbc to confirm normal/stable.

## 2014-04-01 NOTE — Assessment & Plan Note (Signed)
Sees Dr Nadara Mustard.  Continue Mestinon and Cellcept.  Follow cbc.

## 2014-04-01 NOTE — Assessment & Plan Note (Addendum)
Just recently evaluated by cardiology.  Had stress testing and echo.  Due to f/u tomorrow.  Will obtain results then.  On lasix and isosorbide.  Breathing better.  Follow.  Will have cardiology check cxr tomorrow.

## 2014-04-06 ENCOUNTER — Ambulatory Visit: Payer: Self-pay | Admitting: Internal Medicine

## 2014-04-20 DIAGNOSIS — I509 Heart failure, unspecified: Secondary | ICD-10-CM | POA: Insufficient documentation

## 2014-04-20 HISTORY — DX: Heart failure, unspecified: I50.9

## 2014-06-16 ENCOUNTER — Ambulatory Visit (INDEPENDENT_AMBULATORY_CARE_PROVIDER_SITE_OTHER): Payer: Medicare Other | Admitting: Internal Medicine

## 2014-06-16 ENCOUNTER — Ambulatory Visit: Payer: Self-pay | Admitting: Internal Medicine

## 2014-06-16 ENCOUNTER — Encounter: Payer: Self-pay | Admitting: Internal Medicine

## 2014-06-16 VITALS — BP 110/70 | HR 67 | Resp 18 | Ht 68.5 in | Wt 198.5 lb

## 2014-06-16 DIAGNOSIS — I2510 Atherosclerotic heart disease of native coronary artery without angina pectoris: Secondary | ICD-10-CM

## 2014-06-16 DIAGNOSIS — Z8619 Personal history of other infectious and parasitic diseases: Secondary | ICD-10-CM

## 2014-06-16 DIAGNOSIS — G7080 Lambert-Eaton syndrome, unspecified: Secondary | ICD-10-CM

## 2014-06-16 DIAGNOSIS — I255 Ischemic cardiomyopathy: Secondary | ICD-10-CM

## 2014-06-16 DIAGNOSIS — I2589 Other forms of chronic ischemic heart disease: Secondary | ICD-10-CM

## 2014-06-16 DIAGNOSIS — E7800 Pure hypercholesterolemia, unspecified: Secondary | ICD-10-CM

## 2014-06-16 DIAGNOSIS — D649 Anemia, unspecified: Secondary | ICD-10-CM

## 2014-06-16 DIAGNOSIS — R0602 Shortness of breath: Secondary | ICD-10-CM

## 2014-06-16 DIAGNOSIS — C679 Malignant neoplasm of bladder, unspecified: Secondary | ICD-10-CM

## 2014-06-16 LAB — HEPATIC FUNCTION PANEL
ALK PHOS: 57 U/L (ref 39–117)
ALT: 11 U/L (ref 0–53)
AST: 18 U/L (ref 0–37)
Albumin: 4 g/dL (ref 3.5–5.2)
BILIRUBIN DIRECT: 0.1 mg/dL (ref 0.0–0.3)
BILIRUBIN TOTAL: 0.7 mg/dL (ref 0.2–1.2)
Total Protein: 6.9 g/dL (ref 6.0–8.3)

## 2014-06-16 LAB — CBC WITH DIFFERENTIAL/PLATELET
Basophils Absolute: 0 10*3/uL (ref 0.0–0.1)
Basophils Relative: 0.6 % (ref 0.0–3.0)
EOS ABS: 0.1 10*3/uL (ref 0.0–0.7)
EOS PCT: 1.1 % (ref 0.0–5.0)
HEMATOCRIT: 38.4 % — AB (ref 39.0–52.0)
Hemoglobin: 12.7 g/dL — ABNORMAL LOW (ref 13.0–17.0)
LYMPHS ABS: 0.9 10*3/uL (ref 0.7–4.0)
Lymphocytes Relative: 18.6 % (ref 12.0–46.0)
MCHC: 33.1 g/dL (ref 30.0–36.0)
MCV: 89.7 fl (ref 78.0–100.0)
MONO ABS: 0.5 10*3/uL (ref 0.1–1.0)
Monocytes Relative: 9 % (ref 3.0–12.0)
Neutro Abs: 3.6 10*3/uL (ref 1.4–7.7)
Neutrophils Relative %: 70.7 % (ref 43.0–77.0)
PLATELETS: 168 10*3/uL (ref 150.0–400.0)
RBC: 4.28 Mil/uL (ref 4.22–5.81)
RDW: 14.7 % (ref 11.5–15.5)
WBC: 5 10*3/uL (ref 4.0–10.5)

## 2014-06-16 LAB — BASIC METABOLIC PANEL
BUN: 20 mg/dL (ref 6–23)
CHLORIDE: 102 meq/L (ref 96–112)
CO2: 30 mEq/L (ref 19–32)
Calcium: 9.2 mg/dL (ref 8.4–10.5)
Creatinine, Ser: 1.1 mg/dL (ref 0.4–1.5)
GFR: 68.21 mL/min (ref >60.00)
Glucose, Bld: 97 mg/dL (ref 70–99)
Potassium: 4.6 mEq/L (ref 3.5–5.1)
SODIUM: 140 meq/L (ref 135–145)

## 2014-06-16 LAB — TSH: TSH: 0.88 u[IU]/mL (ref 0.35–4.50)

## 2014-06-16 NOTE — Progress Notes (Signed)
Pre visit review using our clinic review tool, if applicable. No additional management support is needed unless otherwise documented below in the visit note. 

## 2014-06-16 NOTE — Progress Notes (Signed)
Subjective:    Patient ID: [name], male    DOB: 1926-01-18, 80 y.o.   MRN: [id number]  HPI 80 year old male with past history of hypercholesterolemia, Eaton-Lamberts with Myasthenia Gravis and anemia who comes in today for a scheduled follow up.  Has bladder cancer.  S/P treatment.  Had to be off his cellcept when he was undergoing treatment for his bladder.  Back on his regular meds now.  Seeing Dr Jacqlyn Larsen.  Previous visit, noticed increased trouble breathing.  See previous notes for details.  Saw cardiology.  Had heart catheterization.  Found to have coronary artery disease and cardiomyopathy.    Being medically managed.  On metoprolol.  Feels better.  No swallowing problems.  Eating and drinking well.  Bowels stable.  Still seeing opthalmology for his eye.  Had shingles.  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  Using drops.     Past Medical History  Diagnosis Date  . Hypercholesterolemia   . Anemia   . Essential hypertension, benign   . Eaton-Lambert syndrome   . Myasthenia gravis   . Chicken pox   . Allergy   . Colon polyp   . Shingles 2013  . Cancer 11-24-12    bladder. BCG treatments by Dr Jacqlyn Larsen.    Current Outpatient Prescriptions on File Prior to Visit  Medication Sig Dispense Refill  . aspirin 81 MG tablet Take 81 mg by mouth daily.      . finasteride (PROSCAR) 5 MG tablet Take 5 mg by mouth daily.      . furosemide (LASIX) 20 MG tablet Take 20 mg by mouth daily.      . isosorbide mononitrate (IMDUR) 30 MG 24 hr tablet Take 30 mg by mouth daily.      . mycophenolate (CELLCEPT) 250 MG capsule Take 250 mg by mouth.      . pyridostigmine (MESTINON) 60 MG tablet Take 120 mg by mouth daily.      . tamsulosin (FLOMAX) 0.4 MG CAPS Take 0.4 mg by mouth daily.       No current facility-administered medications on file prior to visit.    Review of Systems Patient denies any headache, lightheadedness or dizziness.  No significant sinus or allergy symptoms.  No chest pain, tightness  or palpitations.  Breathing is better.  No increased cough or congestion.   No nausea or vomiting.  No abdominal pain or cramping.  Eating and drinking normally.   No bowel change, such as diarrhea, constipation, BRBPR or melana.  No dysuria.  No urinary hesitancy.   Overall feels better.       Objective:   Physical Exam  Filed Vitals:   06/16/14 1010  BP: 110/70  Pulse: 67  Resp: 38   80 year old male in no acute distress.  HEENT:  Nares - clear.  Oropharynx - without lesions. NECK:  Supple.  Nontender.  No audible carotid bruit.  HEART:  Appears to be regular.   LUNGS:  No wheezing audible.  Respirations even and unlabored.  Some dry crackles present.  RADIAL PULSE:  Equal bilaterally.  ABDOMEN:  Soft.  Nontender.  Bowel sounds present and normal.  No audible abdominal bruit.    EXTREMITIES:  No increased edema present.  DP pulses palpable and equal bilaterally.         Assessment & Plan:  GU.  Sees Dr Jacqlyn Larsen.  Diagnosed with bladder cancer.  Treated.  Had f/u cystoscopy.  Scar tissue present.  PULMONARY.  01/09/09 CXR revealed no acute infiltrate.  Scar at the left base.  Have discussed f/u cxr.  He has previously declined.  Agreed today. Check cxr in the hospital.    OPTHALMOLOGY.  Had shingles that involved his eye.  Seeing Dr Thomasene Ripple.  Using steroid eye drops.  Has improved.  Continue follow up with Dr Thomasene Ripple.   HEALTH MAINTENANCE.  GU and prostate checks are done through Dr Cope's office.  Colonoscopy 11/05/11 revealed two hyperplastic rectal polyps and diverticulosis.

## 2014-06-18 ENCOUNTER — Encounter: Payer: Self-pay | Admitting: Internal Medicine

## 2014-06-18 DIAGNOSIS — I2510 Atherosclerotic heart disease of native coronary artery without angina pectoris: Secondary | ICD-10-CM | POA: Insufficient documentation

## 2014-06-18 HISTORY — DX: Atherosclerotic heart disease of native coronary artery without angina pectoris: I25.10

## 2014-06-18 NOTE — Assessment & Plan Note (Signed)
Had eye involvement.  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  Seeing Dr Thomasene Ripple.

## 2014-06-18 NOTE — Assessment & Plan Note (Signed)
Had recent heart catheterization.  Found to have known cad and cardiomyopathy.  Elected medication management.  Doing better.  Breathing better.  Follow.

## 2014-06-18 NOTE — Assessment & Plan Note (Signed)
Low cholesterol diet and exercise.  Back on cholesterol medication now.  Follow.

## 2014-06-18 NOTE — Assessment & Plan Note (Signed)
Sees Dr Nadara Mustard.  Continue Mestinon and Cellcept.  Follow cbc.

## 2014-06-18 NOTE — Assessment & Plan Note (Signed)
Breathing better now on current med regimen.  Follow.

## 2014-06-18 NOTE — Assessment & Plan Note (Signed)
Follow cbc to confirm normal/stable.

## 2014-06-18 NOTE — Assessment & Plan Note (Signed)
Following with Dr Jacqlyn Larsen.

## 2014-06-19 ENCOUNTER — Other Ambulatory Visit: Payer: Self-pay | Admitting: Internal Medicine

## 2014-06-19 DIAGNOSIS — D649 Anemia, unspecified: Secondary | ICD-10-CM

## 2014-06-19 NOTE — Progress Notes (Signed)
Orders placed for f/u labs.  

## 2014-06-20 ENCOUNTER — Telehealth: Payer: Self-pay | Admitting: Internal Medicine

## 2014-06-20 NOTE — Telephone Encounter (Signed)
Pt.notified

## 2014-06-20 NOTE — Telephone Encounter (Signed)
Notify pt that cxr reveals no acute abnormality.  Stable.

## 2014-06-26 ENCOUNTER — Other Ambulatory Visit (INDEPENDENT_AMBULATORY_CARE_PROVIDER_SITE_OTHER): Payer: Medicare Other

## 2014-06-26 DIAGNOSIS — D649 Anemia, unspecified: Secondary | ICD-10-CM

## 2014-06-26 LAB — CBC WITH DIFFERENTIAL/PLATELET
Basophils Absolute: 0 K/uL (ref 0.0–0.1)
Basophils Relative: 0.5 % (ref 0.0–3.0)
Eosinophils Absolute: 0.1 K/uL (ref 0.0–0.7)
Eosinophils Relative: 1.4 % (ref 0.0–5.0)
HCT: 39.6 % (ref 39.0–52.0)
Hemoglobin: 12.8 g/dL — ABNORMAL LOW (ref 13.0–17.0)
Lymphocytes Relative: 22.4 % (ref 12.0–46.0)
Lymphs Abs: 1.2 K/uL (ref 0.7–4.0)
MCHC: 32.3 g/dL (ref 30.0–36.0)
MCV: 92.3 fl (ref 78.0–100.0)
Monocytes Absolute: 0.5 K/uL (ref 0.1–1.0)
Monocytes Relative: 10.3 % (ref 3.0–12.0)
Neutro Abs: 3.5 K/uL (ref 1.4–7.7)
Neutrophils Relative %: 65.4 % (ref 43.0–77.0)
Platelets: 166 K/uL (ref 150.0–400.0)
RBC: 4.29 Mil/uL (ref 4.22–5.81)
RDW: 14.7 % (ref 11.5–15.5)
WBC: 5.3 K/uL (ref 4.0–10.5)

## 2014-06-26 LAB — FERRITIN: Ferritin: 147.2 ng/mL (ref 22.0–322.0)

## 2014-06-26 LAB — IBC PANEL
Iron: 82 ug/dL (ref 42–165)
Saturation Ratios: 25.3 % (ref 20.0–50.0)
Transferrin: 231.1 mg/dL (ref 212.0–360.0)

## 2014-06-26 LAB — VITAMIN B12: Vitamin B-12: 469 pg/mL (ref 211–911)

## 2014-06-27 ENCOUNTER — Other Ambulatory Visit: Payer: Self-pay | Admitting: Internal Medicine

## 2014-06-27 ENCOUNTER — Encounter: Payer: Self-pay | Admitting: *Deleted

## 2014-06-27 DIAGNOSIS — E7800 Pure hypercholesterolemia, unspecified: Secondary | ICD-10-CM

## 2014-06-27 DIAGNOSIS — D649 Anemia, unspecified: Secondary | ICD-10-CM

## 2014-06-27 NOTE — Progress Notes (Signed)
Orders placed for labs

## 2014-07-17 DIAGNOSIS — I6523 Occlusion and stenosis of bilateral carotid arteries: Secondary | ICD-10-CM

## 2014-07-17 DIAGNOSIS — I5022 Chronic systolic (congestive) heart failure: Secondary | ICD-10-CM | POA: Insufficient documentation

## 2014-07-17 HISTORY — DX: Occlusion and stenosis of bilateral carotid arteries: I65.23

## 2014-07-26 ENCOUNTER — Other Ambulatory Visit (INDEPENDENT_AMBULATORY_CARE_PROVIDER_SITE_OTHER): Payer: Medicare Other

## 2014-07-26 ENCOUNTER — Ambulatory Visit (INDEPENDENT_AMBULATORY_CARE_PROVIDER_SITE_OTHER): Payer: Medicare Other

## 2014-07-26 DIAGNOSIS — E7800 Pure hypercholesterolemia, unspecified: Secondary | ICD-10-CM

## 2014-07-26 DIAGNOSIS — Z23 Encounter for immunization: Secondary | ICD-10-CM

## 2014-07-26 DIAGNOSIS — D649 Anemia, unspecified: Secondary | ICD-10-CM

## 2014-07-26 LAB — CBC WITH DIFFERENTIAL/PLATELET
Basophils Absolute: 0 10*3/uL (ref 0.0–0.1)
Basophils Relative: 0.4 % (ref 0.0–3.0)
Eosinophils Absolute: 0.1 10*3/uL (ref 0.0–0.7)
Eosinophils Relative: 0.9 % (ref 0.0–5.0)
HCT: 42.5 % (ref 39.0–52.0)
Hemoglobin: 13.6 g/dL (ref 13.0–17.0)
Lymphocytes Relative: 22 % (ref 12.0–46.0)
Lymphs Abs: 1.2 10*3/uL (ref 0.7–4.0)
MCHC: 32 g/dL (ref 30.0–36.0)
MCV: 93 fl (ref 78.0–100.0)
Monocytes Absolute: 0.5 10*3/uL (ref 0.1–1.0)
Monocytes Relative: 9.2 % (ref 3.0–12.0)
Neutro Abs: 3.8 10*3/uL (ref 1.4–7.7)
Neutrophils Relative %: 67.5 % (ref 43.0–77.0)
Platelets: 168 10*3/uL (ref 150.0–400.0)
RBC: 4.57 Mil/uL (ref 4.22–5.81)
RDW: 14.9 % (ref 11.5–15.5)
WBC: 5.6 10*3/uL (ref 4.0–10.5)

## 2014-07-26 LAB — LIPID PANEL
Cholesterol: 174 mg/dL (ref 0–200)
HDL: 54.4 mg/dL
LDL Cholesterol: 106 mg/dL — ABNORMAL HIGH (ref 0–99)
NonHDL: 119.6
Total CHOL/HDL Ratio: 3
Triglycerides: 66 mg/dL (ref 0.0–149.0)
VLDL: 13.2 mg/dL (ref 0.0–40.0)

## 2014-07-27 ENCOUNTER — Other Ambulatory Visit: Payer: Self-pay | Admitting: *Deleted

## 2014-07-27 MED ORDER — PRAVASTATIN SODIUM 20 MG PO TABS: 20.0000 mg | ORAL_TABLET | Freq: Every day | ORAL | Status: DC

## 2014-08-11 ENCOUNTER — Encounter: Payer: Self-pay | Admitting: Internal Medicine

## 2014-08-29 ENCOUNTER — Ambulatory Visit (INDEPENDENT_AMBULATORY_CARE_PROVIDER_SITE_OTHER): Payer: Medicare Other | Admitting: Internal Medicine

## 2014-08-29 ENCOUNTER — Encounter: Payer: Self-pay | Admitting: Internal Medicine

## 2014-08-29 VITALS — BP 110/60 | HR 61 | Temp 98.3°F | Ht 68.5 in | Wt 201.2 lb

## 2014-08-29 DIAGNOSIS — J019 Acute sinusitis, unspecified: Secondary | ICD-10-CM

## 2014-08-29 MED ORDER — AMOXICILLIN 875 MG PO TABS: 875.0000 mg | ORAL_TABLET | Freq: Two times a day (BID) | ORAL | Status: DC

## 2014-08-29 NOTE — Patient Instructions (Signed)
Saline nasal spray - flush nose at least 2-3x/day  nasacort nasal spray - 2 sprays each nostril one time per day.  Do this in the evening.    Robitussin DM twice a day.

## 2014-08-29 NOTE — Progress Notes (Signed)
Pre visit review using our clinic review tool, if applicable. No additional management support is needed unless otherwise documented below in the visit note. 

## 2014-08-30 ENCOUNTER — Encounter: Payer: Self-pay | Admitting: Internal Medicine

## 2014-08-30 DIAGNOSIS — I6523 Occlusion and stenosis of bilateral carotid arteries: Secondary | ICD-10-CM | POA: Insufficient documentation

## 2014-08-30 DIAGNOSIS — J329 Chronic sinusitis, unspecified: Secondary | ICD-10-CM | POA: Insufficient documentation

## 2014-08-30 HISTORY — DX: Occlusion and stenosis of bilateral carotid arteries: I65.23

## 2014-08-30 NOTE — Progress Notes (Signed)
  Subjective:    Patient ID: [name], male    DOB: [birth year], 80 y.o.   MRN: [id number]  Sinusitis  80 year old male with past history of hypercholesterolemia, Eaton-Lamberts with Myasthenia Gravis and anemia who comes in today as a work in with concerns regarding a possible sinus infection.   States symptoms started one week ago.  Describes increased sinus pressure and nasal congestion.  Increased drainage.  Increased cough.  No chest congestion.  No chest tightness or increased sob.  Some colored mucus production.  Eating and drinking well.   No nausea or vomiting.  Bowels stable.      Past Medical History  Diagnosis Date  . Hypercholesterolemia   . Anemia   . Essential hypertension, benign   . Eaton-Lambert syndrome   . Myasthenia gravis   . Chicken pox   . Allergy   . Colon polyp   . Shingles 2013  . Cancer 11-24-12    bladder. BCG treatments by Dr Jacqlyn Larsen.    Current Outpatient Prescriptions on File Prior to Visit  Medication Sig Dispense Refill  . aspirin 81 MG tablet Take 81 mg by mouth daily.    . finasteride (PROSCAR) 5 MG tablet Take 5 mg by mouth daily.    . furosemide (LASIX) 20 MG tablet Take 20 mg by mouth daily.    . isosorbide mononitrate (IMDUR) 30 MG 24 hr tablet Take 30 mg by mouth daily.    Marland Kitchen lisinopril (PRINIVIL,ZESTRIL) 5 MG tablet Take 5 mg by mouth daily.    . metoprolol tartrate (LOPRESSOR) 25 MG tablet Take 12.5 mg by mouth 2 (two) times daily.    . mycophenolate (CELLCEPT) 250 MG capsule Take 250 mg by mouth.    . pravastatin (PRAVACHOL) 20 MG tablet Take 1 tablet (20 mg total) by mouth daily. 90 tablet 1  . pyridostigmine (MESTINON) 60 MG tablet Take 120 mg by mouth daily.    . tamsulosin (FLOMAX) 0.4 MG CAPS Take 0.4 mg by mouth daily.     No current facility-administered medications on file prior to visit.    Review of Systems Patient denies any headache, lightheadedness or dizziness.  Increased sinus pressure and nasal congestion.  No sore  throat.  Increased cough.  No chest congestion.   No chest pain or tightness.  No sob.  Breathing is better.   No nausea or vomiting.   Eating and drinking normally.   No bowel change, such as diarrhea.       Objective:   Physical Exam  Filed Vitals:   08/29/14 1550  BP: 110/60  Pulse: 61  Temp: 98.3 F (59.50 C)   80 year old male in no acute distress.  HEENT:  Nares - slightly erythematous turbinates.  Oropharynx - without lesions.  Minimal tenderness to palpation over the sinuses.   NECK:  Supple.  Nontender.   HEART:  Appears to be regular.   LUNGS:  No wheezing audible.  Respirations even and unlabored.  Some dry crackles present - stable.        Assessment & Plan:  Acute sinusitis, recurrence not specified, unspecified location Symptoms as outlined.  Treat with amoxicillin 875mg  bid x 10 days.  Saline nasal spray and nasacort nasal spray as outlined.  Robitussin DM as directed.  Rest.  Fluids.  Explained to him if symptoms change, worsen or do not resolve - needs to be reevaluated.

## 2014-09-08 ENCOUNTER — Encounter: Payer: Self-pay | Admitting: Internal Medicine

## 2014-09-08 ENCOUNTER — Ambulatory Visit (INDEPENDENT_AMBULATORY_CARE_PROVIDER_SITE_OTHER): Payer: Medicare Other | Admitting: Internal Medicine

## 2014-09-08 VITALS — BP 110/60 | HR 61 | Temp 98.2°F | Ht 68.5 in | Wt 199.5 lb

## 2014-09-08 DIAGNOSIS — E7800 Pure hypercholesterolemia, unspecified: Secondary | ICD-10-CM

## 2014-09-08 DIAGNOSIS — C679 Malignant neoplasm of bladder, unspecified: Secondary | ICD-10-CM

## 2014-09-08 DIAGNOSIS — G7080 Lambert-Eaton syndrome, unspecified: Secondary | ICD-10-CM

## 2014-09-08 DIAGNOSIS — I2510 Atherosclerotic heart disease of native coronary artery without angina pectoris: Secondary | ICD-10-CM

## 2014-09-08 DIAGNOSIS — D649 Anemia, unspecified: Secondary | ICD-10-CM

## 2014-09-08 NOTE — Progress Notes (Signed)
Subjective:    Patient ID: [name], male    DOB: 16-Feb-1926, 80 y.o.   MRN: [id number]  HPI 80 year old male with past history of hypercholesterolemia, Eaton-Lamberts with Myasthenia Gravis and anemia who comes in today to follow up on these issues as well as for a complete physical exam.  Has bladder cancer.  S/P treatment.  Had to be off his cellcept when he was undergoing treatment for his bladder.  Back on his regular meds now.  Seeing Dr Jacqlyn Larsen.  Previous visit, noticed increased trouble breathing.  See previous notes for details.  Saw cardiology.  Had heart catheterization.  Found to have coronary artery disease and cardiomyopathy.    Being medically managed.  On metoprolol.  Feels better.  Feels breathing is stable.  No increased sob.  No swallowing problems.  Eating and drinking well.  Bowels stable.  Still seeing opthalmology for his eye.  Had shingles.  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  Using drops.  Due to f/u with Dr Jacqlyn Larsen in 12/2014.  Just recently evaluated for a sinus infection.  Treated.  Better.     Past Medical History  Diagnosis Date  . Hypercholesterolemia   . Anemia   . Essential hypertension, benign   . Eaton-Lambert syndrome   . Myasthenia gravis   . Chicken pox   . Allergy   . Colon polyp   . Shingles 2013  . Cancer 11-24-12    bladder. BCG treatments by Dr Jacqlyn Larsen.    Current Outpatient Prescriptions on File Prior to Visit  Medication Sig Dispense Refill  . amoxicillin (AMOXIL) 875 MG tablet Take 1 tablet (875 mg total) by mouth 2 (two) times daily. 20 tablet 0  . aspirin 81 MG tablet Take 81 mg by mouth daily.    . bimatoprost (LUMIGAN) 0.01 % SOLN Place 1 drop into the right eye at bedtime.    Marland Kitchen dextromethorphan (DELSYM) 30 MG/5ML liquid Take by mouth as needed for cough.    . finasteride (PROSCAR) 5 MG tablet Take 5 mg by mouth daily.    . furosemide (LASIX) 20 MG tablet Take 20 mg by mouth daily.    . isosorbide mononitrate (IMDUR) 30 MG 24 hr tablet Take  30 mg by mouth daily.    Marland Kitchen lisinopril (PRINIVIL,ZESTRIL) 5 MG tablet Take 5 mg by mouth daily.    . metoprolol tartrate (LOPRESSOR) 25 MG tablet Take 12.5 mg by mouth 2 (two) times daily.    . mycophenolate (CELLCEPT) 250 MG capsule Take 250 mg by mouth.    . pravastatin (PRAVACHOL) 20 MG tablet Take 1 tablet (20 mg total) by mouth daily. 90 tablet 1  . pyridostigmine (MESTINON) 60 MG tablet Take 120 mg by mouth daily.    . tamsulosin (FLOMAX) 0.4 MG CAPS Take 0.4 mg by mouth daily.     No current facility-administered medications on file prior to visit.    Review of Systems Patient denies any headache, lightheadedness or dizziness.  No significant sinus or allergy symptoms now.  Better.   No chest pain, tightness or palpitations.  Breathing is stable.  No increased cough or congestion.   No nausea or vomiting.  No abdominal pain or cramping.  Eating and drinking normally.   No bowel change, such as diarrhea, constipation, BRBPR or melana.  No dysuria.  No urinary hesitancy.   Overall feels better.       Objective:   Physical Exam  Filed Vitals:   09/08/14  0828  BP: 110/60  Pulse: 61  Temp: 98.2 F (59.34 C)   80 year old male in no acute distress.  HEENT:  Nares - clear.  Oropharynx - without lesions. NECK:  Supple.  Nontender.  No audible carotid bruit.  HEART:  Appears to be regular.   LUNGS:  No wheezing audible.  Respirations even and unlabored.  Some dry crackles present.  RADIAL PULSE:  Equal bilaterally.  ABDOMEN:  Soft.  Nontender.  Bowel sounds present and normal.  No audible abdominal bruit.   GU:  Performed by urology.    EXTREMITIES:  No increased edema present.  DP pulses palpable and equal bilaterally.         Assessment & Plan:  Coronary artery disease involving native coronary artery of native heart without angina pectoris Had heart cath.  Found to have known CAD and cardiomyopathy.  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  Doing better.  Breathing better.  Continue f/u with  cardiology.   Eaton-Lambert myasthenic syndrome Following with Dr Nadara Mustard.  On mestinon and cellcept.    Malignant neoplasm of urinary bladder, unspecified site Following with Dr Jacqlyn Larsen.  Due f/u in 12/2014.  States stable.    Anemia, unspecified anemia type Last hgb wnl.  Follow.    Hypercholesterolemia Low cholesterol diet and exercise.  On pravastatin.  Follow lipid panel and liver function tests.    GU.  Sees Dr Jacqlyn Larsen.  Diagnosed with bladder cancer.  Treated.  Had f/u cystoscopy.  Scar tissue present.     PULMONARY.  01/09/09 CXR revealed no acute infiltrate.  Scar at the left base.  CXR 06/16/14 - stable chronic bibasilar atelectasis.      OPTHALMOLOGY.  Had shingles that involved his eye.  Seeing Dr Thomasene Ripple.  Using steroid eye drops.  Has improved.  Continue follow up with Dr Thomasene Ripple.   HEALTH MAINTENANCE.  GU and prostate checks are done through Dr Cope's office.  Colonoscopy 11/05/11 revealed two hyperplastic rectal polyps and diverticulosis.

## 2014-09-08 NOTE — Progress Notes (Signed)
Pre visit review using our clinic review tool, if applicable. No additional management support is needed unless otherwise documented below in the visit note. 

## 2014-09-10 ENCOUNTER — Encounter: Payer: Self-pay | Admitting: Internal Medicine

## 2014-11-10 ENCOUNTER — Other Ambulatory Visit (INDEPENDENT_AMBULATORY_CARE_PROVIDER_SITE_OTHER): Payer: Medicare Other

## 2014-11-10 DIAGNOSIS — C679 Malignant neoplasm of bladder, unspecified: Secondary | ICD-10-CM

## 2014-11-10 DIAGNOSIS — E7800 Pure hypercholesterolemia, unspecified: Secondary | ICD-10-CM

## 2014-11-10 DIAGNOSIS — G7080 Lambert-Eaton syndrome, unspecified: Secondary | ICD-10-CM

## 2014-11-10 LAB — CBC WITH DIFFERENTIAL/PLATELET
BASOS PCT: 0.5 % (ref 0.0–3.0)
Basophils Absolute: 0 10*3/uL (ref 0.0–0.1)
EOS ABS: 0.1 10*3/uL (ref 0.0–0.7)
Eosinophils Relative: 1.5 % (ref 0.0–5.0)
HCT: 41.8 % (ref 39.0–52.0)
HEMOGLOBIN: 13.6 g/dL (ref 13.0–17.0)
LYMPHS PCT: 23.9 % (ref 12.0–46.0)
Lymphs Abs: 1.2 10*3/uL (ref 0.7–4.0)
MCHC: 32.5 g/dL (ref 30.0–36.0)
MCV: 90.1 fl (ref 78.0–100.0)
MONO ABS: 0.4 10*3/uL (ref 0.1–1.0)
Monocytes Relative: 8.7 % (ref 3.0–12.0)
Neutro Abs: 3.3 10*3/uL (ref 1.4–7.7)
Neutrophils Relative %: 65.4 % (ref 43.0–77.0)
Platelets: 161 10*3/uL (ref 150.0–400.0)
RBC: 4.64 Mil/uL (ref 4.22–5.81)
RDW: 15.1 % (ref 11.5–15.5)
WBC: 5.1 10*3/uL (ref 4.0–10.5)

## 2014-11-10 LAB — LIPID PANEL
Cholesterol: 147 mg/dL (ref 0–200)
HDL: 51 mg/dL (ref >39.00)
LDL CALC: 78 mg/dL (ref 0–99)
NonHDL: 96
Total CHOL/HDL Ratio: 3
Triglycerides: 89 mg/dL (ref 0.0–149.0)
VLDL: 17.8 mg/dL (ref 0.0–40.0)

## 2014-11-10 LAB — BASIC METABOLIC PANEL
BUN: 20 mg/dL (ref 6–23)
CHLORIDE: 104 meq/L (ref 96–112)
CO2: 32 mEq/L (ref 19–32)
CREATININE: 1.1 mg/dL (ref 0.40–1.50)
Calcium: 9.1 mg/dL (ref 8.4–10.5)
GFR: 67.43 mL/min (ref >60.00)
Glucose, Bld: 104 mg/dL — ABNORMAL HIGH (ref 70–99)
Potassium: 4.6 mEq/L (ref 3.5–5.1)
Sodium: 142 mEq/L (ref 135–145)

## 2014-11-10 LAB — HEPATIC FUNCTION PANEL
ALT: 11 U/L (ref 0–53)
AST: 15 U/L (ref 0–37)
Albumin: 4.1 g/dL (ref 3.5–5.2)
Alkaline Phosphatase: 51 U/L (ref 39–117)
BILIRUBIN DIRECT: 0.1 mg/dL (ref 0.0–0.3)
Total Bilirubin: 0.7 mg/dL (ref 0.2–1.2)
Total Protein: 6.7 g/dL (ref 6.0–8.3)

## 2014-11-13 ENCOUNTER — Encounter: Payer: Self-pay | Admitting: *Deleted

## 2014-12-18 ENCOUNTER — Ambulatory Visit: Payer: Self-pay | Admitting: Ophthalmology

## 2014-12-18 DIAGNOSIS — I2510 Atherosclerotic heart disease of native coronary artery without angina pectoris: Secondary | ICD-10-CM

## 2014-12-25 ENCOUNTER — Ambulatory Visit: Payer: Self-pay | Admitting: Ophthalmology

## 2015-01-19 NOTE — Op Note (Signed)
PATIENT NAME:  [name], SYME MR#:  [id number] DATE OF BIRTH:  02/22/1926  DATE OF PROCEDURE:  11/02/2012  PREOPERATIVE DIAGNOSIS: Bladder tumor.   POSTOPERATIVE DIAGNOSIS: Bladder tumor.  PROCEDURES: Cystoscopy, bladder biopsy, mitomycin bladder instillation.   SURGEON: Nicholaus Bloom, M.D.   ANESTHESIA: Laryngeal mask airway anesthesia.   INDICATION: The patient is an 81 year old gentleman who recently presented for evaluation of voiding difficulty. Cystoscopy demonstrated a large median lobe prostate as well as an approximate 2.5 to 3 cm area of raised erythematous irregular mucosa, with early papillary findings, on the left lateral wall. He presents for bladder biopsy.   DESCRIPTION OF PROCEDURE: After informed consent was obtained, the patient was taken to the operating room and placed in the dorsal lithotomy position. Under laryngeal mask airway anesthesia, he was then prepped and draped in the usual standard fashion. Cystoscopy was performed with the 22 French rigid cystoscope. No significant urethral abnormalities were noted. Upon entering the prostatic fossa, prominent bilobar hypertrophy was noted with complete visual obstruction. Upon entering the bladder neck complete visual obstruction of the bladder neck was noted from a large median lobe portion of the prostate with significant hypervascularity and inflammatory changes. The scope was advanced into the urinary bladder. The mucosa was then inspected in its entirety. An approximate 2.5 to 3 cm area of raised erythematous irregular mucosa was noted in the left lateral bladder wall with early papillary features. Bilateral ureteral orifices were well visualized with no lesions noted. No other appreciable lesions were present within the bladder. Cold cup biopsy forceps were utilized to biopsy multiple areas with the irregular tissue changes and papillary features. The bulk of the tumor was removed with the large biopsy forceps. The area was then  extensively cauterized utilizing a Bugbee electrode. No significant bleeding was encountered. The bladder was reinspected utilizing the MBI feature with no additional abnormalities noted. The bladder was drained. The cystoscope was removed. An 65 French red rubber catheter was then inserted without difficulty. 20 mg of mitomycin in 50 mL of water was instilled into the urinary bladder utilizing standard technique. Chemotherapy protocol and precautions were followed. The catheter was then removed. The patient was returned to the supine position and awakened from laryngeal mask airway anesthesia. He was taken to the recovery room in stable condition. There were no problems or complications. The patient tolerated the procedure well. ____________________________ Denice Bors. Jacqlyn Larsen, MD bsc:sb D: 11/02/2012 21:17:30 ET T: 11/03/2012 07:04:53 ET JOB#: [id number]  cc: Denice Bors. Jacqlyn Larsen, MD, <Dictator> Denice Bors Epic Tribbett MD ELECTRONICALLY SIGNED 11/04/2012 7:44

## 2015-01-28 NOTE — Op Note (Signed)
PATIENT NAME:  [name], [name] MR#:  [id number] DATE OF BIRTH:  1926/02/15  DATE OF PROCEDURE:  12/25/2014  PREOPERATIVE DIAGNOSIS:  Nuclear sclerotic cataract, right eye.     POSTOPERATIVE DIAGNOSIS:  Nuclear sclerotic cataract, right eye.    PROCEDURE PERFORMED:  Extracapsular cataract extraction using phacoemulsification with placement of an Alcon SN60WS, 22.0-diopter posterior chamber lens, serial number [id number].    SURGEON:  Loura Back. Anniemae Haberkorn, MD  ASSISTANT:  None.  ANESTHESIA:  4% lidocaine and 0.75% Marcaine in a 50/50 mixture with 10 units per mL of Hylenex added, given as a peribulbar.  ANESTHESIOLOGIST:  Dr. Marcello Moores.    COMPLICATIONS:  None.  ESTIMATED BLOOD LOSS:  Less than 1 ml.  DESCRIPTION OF PROCEDURE:  The patient was brought to the operating room and given a peribulbar block.  The patient was then prepped and draped in the usual fashion.  The vertical rectus muscles were imbricated using 5-0 silk sutures.  These sutures were then clamped to the sterile drapes as bridle sutures.  A limbal peritomy was performed extending two clock hours and hemostasis was obtained with cautery.  A partial thickness scleral groove was made at the surgical limbus and dissected anteriorly in a lamellar dissection using an Alcon crescent knife.  The anterior chamber was entered superonasally with a Superblade and through the lamellar dissection with a 2.6 mm keratome.  DisCoVisc was used to replace the aqueous and a continuous tear capsulorrhexis was carried out.  Hydrodissection and hydrodelineation were carried out with balanced salt and a 27 gauge canula.  The nucleus was rotated to confirm the effectiveness of the hydrodissection.  Phacoemulsification was carried out using a divide-and-conquer technique.  Total ultrasound time was 2 minutes and 20 seconds with an average power of 25.7 percent.  CDE of 66.08.   Irrigation/aspiration was used to remove the residual cortex.  DisCoVisc  was used to inflate the capsule and the internal incision was enlarged to 3 mm with the crescent knife.  The intraocular lens was folded and inserted into the capsular bag using an AcrySert delivery system.  Irrigation/aspiration was used to remove the residual DisCoVisc.  Miostat was injected into the anterior chamber through the paracentesis track to inflate the anterior chamber and induce miosis.  0.1 mL of cefuroxime containing 1 mg of drug was injected via the paracentesis track.  The wound was checked for leaks and none were found. The conjunctiva was closed with cautery and the bridle sutures were removed.  Two drops of 0.3% Vigamox were placed on the eye.   An eye shield was placed on the eye.  The patient was discharged to the recovery room in good condition.   ____________________________ Loura Back Alekzander Cardell, MD sad:bu D: 12/25/2014 11:02:14 ET T: 12/25/2014 20:12:58 ET JOB#: [id number]  cc: Remo Lipps A. Leevi Cullars, MD, <Dictator> Martie Lee MD ELECTRONICALLY SIGNED 01/01/2015 9:49

## 2015-03-09 ENCOUNTER — Ambulatory Visit (INDEPENDENT_AMBULATORY_CARE_PROVIDER_SITE_OTHER): Payer: Medicare Other | Admitting: Internal Medicine

## 2015-03-09 ENCOUNTER — Encounter: Payer: Self-pay | Admitting: Internal Medicine

## 2015-03-09 VITALS — BP 100/58 | HR 61 | Temp 97.7°F | Resp 12 | Ht 69.0 in | Wt 200.0 lb

## 2015-03-09 DIAGNOSIS — C679 Malignant neoplasm of bladder, unspecified: Secondary | ICD-10-CM

## 2015-03-09 DIAGNOSIS — D649 Anemia, unspecified: Secondary | ICD-10-CM | POA: Diagnosis not present

## 2015-03-09 DIAGNOSIS — G7080 Lambert-Eaton syndrome, unspecified: Secondary | ICD-10-CM | POA: Diagnosis not present

## 2015-03-09 DIAGNOSIS — E7800 Pure hypercholesterolemia, unspecified: Secondary | ICD-10-CM

## 2015-03-09 DIAGNOSIS — Z0000 Encounter for general adult medical examination without abnormal findings: Secondary | ICD-10-CM

## 2015-03-09 DIAGNOSIS — I2510 Atherosclerotic heart disease of native coronary artery without angina pectoris: Secondary | ICD-10-CM | POA: Diagnosis not present

## 2015-03-09 DIAGNOSIS — R21 Rash and other nonspecific skin eruption: Secondary | ICD-10-CM

## 2015-03-09 LAB — CBC WITH DIFFERENTIAL/PLATELET
BASOS ABS: 0 10*3/uL (ref 0.0–0.1)
Basophils Relative: 0.5 % (ref 0.0–3.0)
EOS PCT: 1.1 % (ref 0.0–5.0)
Eosinophils Absolute: 0.1 10*3/uL (ref 0.0–0.7)
HCT: 42.5 % (ref 39.0–52.0)
Hemoglobin: 14.1 g/dL (ref 13.0–17.0)
LYMPHS PCT: 21.3 % (ref 12.0–46.0)
Lymphs Abs: 1.1 10*3/uL (ref 0.7–4.0)
MCHC: 33 g/dL (ref 30.0–36.0)
MCV: 90.2 fl (ref 78.0–100.0)
Monocytes Absolute: 0.4 10*3/uL (ref 0.1–1.0)
Monocytes Relative: 8.5 % (ref 3.0–12.0)
NEUTROS ABS: 3.4 10*3/uL (ref 1.4–7.7)
NEUTROS PCT: 68.6 % (ref 43.0–77.0)
Platelets: 156 10*3/uL (ref 150.0–400.0)
RBC: 4.72 Mil/uL (ref 4.22–5.81)
RDW: 14.4 % (ref 11.5–15.5)
WBC: 5 10*3/uL (ref 4.0–10.5)

## 2015-03-09 LAB — LIPID PANEL
Cholesterol: 157 mg/dL (ref 0–200)
HDL: 45.9 mg/dL (ref >39.00)
LDL CALC: 91 mg/dL (ref 0–99)
NonHDL: 111.1
Total CHOL/HDL Ratio: 3
Triglycerides: 102 mg/dL (ref 0.0–149.0)
VLDL: 20.4 mg/dL (ref 0.0–40.0)

## 2015-03-09 LAB — BASIC METABOLIC PANEL
BUN: 21 mg/dL (ref 6–23)
CHLORIDE: 102 meq/L (ref 96–112)
CO2: 29 meq/L (ref 19–32)
CREATININE: 1.09 mg/dL (ref 0.40–1.50)
Calcium: 9.4 mg/dL (ref 8.4–10.5)
GFR: 68.09 mL/min (ref >60.00)
GLUCOSE: 99 mg/dL (ref 70–99)
POTASSIUM: 4.7 meq/L (ref 3.5–5.1)
Sodium: 139 mEq/L (ref 135–145)

## 2015-03-09 LAB — HEPATIC FUNCTION PANEL
ALK PHOS: 55 U/L (ref 39–117)
ALT: 10 U/L (ref 0–53)
AST: 16 U/L (ref 0–37)
Albumin: 4.3 g/dL (ref 3.5–5.2)
BILIRUBIN TOTAL: 0.8 mg/dL (ref 0.2–1.2)
Bilirubin, Direct: 0.1 mg/dL (ref 0.0–0.3)
Total Protein: 6.7 g/dL (ref 6.0–8.3)

## 2015-03-09 MED ORDER — NYSTATIN 100000 UNIT/GM EX CREA: 1.0000 "application " | TOPICAL_CREAM | Freq: Two times a day (BID) | CUTANEOUS | Status: DC

## 2015-03-09 NOTE — Progress Notes (Signed)
Pre-visit discussion using our clinic review tool. No additional management support is needed unless otherwise documented below in the visit note.  

## 2015-03-09 NOTE — Progress Notes (Signed)
Patient ID: [name], male   DOB: 06-04-1926, 81 y.o.   MRN: [id number]   Subjective:    Patient ID: [name], male    DOB: Feb 19, 1926, 81 y.o.   MRN: [id number]  HPI  Patient here for a scheduled follow up.  Reports he has noticed a rash under his left arm (left axilla).  Previous itching.  Used some cortisone cream x 1.  States this made it sting more.  Has stopped his deodorant.  States his breathing is stable.  No increased cough or congestion.  No increased cough.  Bowels stable.  No cardiac symptoms with increased activity or exertion.    Past Medical History  Diagnosis Date  . Hypercholesterolemia   . Anemia   . Essential hypertension, benign   . Eaton-Lambert syndrome   . Myasthenia gravis   . Chicken pox   . Allergy   . Colon polyp   . Shingles 2013  . Cancer 11-24-12    bladder. BCG treatments by Dr Jacqlyn Larsen.    Current Outpatient Prescriptions on File Prior to Visit  Medication Sig Dispense Refill  . aspirin 81 MG tablet Take 81 mg by mouth daily.    . finasteride (PROSCAR) 5 MG tablet Take 5 mg by mouth daily.    . furosemide (LASIX) 20 MG tablet Take 20 mg by mouth daily.    . isosorbide mononitrate (IMDUR) 30 MG 24 hr tablet Take 30 mg by mouth daily.    Marland Kitchen lisinopril (PRINIVIL,ZESTRIL) 5 MG tablet Take 5 mg by mouth daily.    . metoprolol tartrate (LOPRESSOR) 25 MG tablet Take 12.5 mg by mouth 2 (two) times daily.    . mycophenolate (CELLCEPT) 250 MG capsule Take 250 mg by mouth.    . pravastatin (PRAVACHOL) 20 MG tablet Take 1 tablet (20 mg total) by mouth daily. 90 tablet 1  . pyridostigmine (MESTINON) 60 MG tablet Take 120 mg by mouth daily.    . tamsulosin (FLOMAX) 0.4 MG CAPS Take 0.4 mg by mouth daily.    Marland Kitchen amoxicillin (AMOXIL) 875 MG tablet Take 1 tablet (875 mg total) by mouth 2 (two) times daily. (Patient not taking: Reported on 03/09/2015) 20 tablet 0  . bimatoprost (LUMIGAN) 0.01 % SOLN Place 1 drop into the right eye at bedtime.    Marland Kitchen  dextromethorphan (DELSYM) 30 MG/5ML liquid Take by mouth as needed for cough.     No current facility-administered medications on file prior to visit.    Review of Systems  Constitutional: Negative for appetite change and unexpected weight change.  HENT: Negative for congestion and sinus pressure.   Respiratory: Negative for cough, chest tightness and shortness of breath.   Cardiovascular: Negative for chest pain, palpitations and leg swelling.  Gastrointestinal: Negative for nausea, vomiting, abdominal pain and diarrhea.  Skin: Positive for rash (localized left axilla).  Neurological: Negative for dizziness, light-headedness and headaches.  Psychiatric/Behavioral: Negative for dysphoric mood and agitation.       Objective:     Blood pressure recheck:  118/68, pulse 64  Physical Exam  Constitutional: He appears well-developed and well-nourished. No distress.  HENT:  Nose: Nose normal.  Mouth/Throat: Oropharynx is clear and moist.  Neck: Neck supple.  Cardiovascular: Normal rate and regular rhythm.   Pulmonary/Chest: Effort normal and breath sounds normal. No respiratory distress.  Dry crackles at base - unchanged/stable   Abdominal: Soft. Bowel sounds are normal. There is no tenderness.  Musculoskeletal: He exhibits no edema or tenderness.  Lymphadenopathy:    He has no cervical adenopathy.  Skin: Rash noted. There is erythema.  Psychiatric: He has a normal mood and affect. His behavior is normal.    BP 100/58 mmHg  Pulse 61  Temp(Src) 97.7 F (36.5 C) (Oral)  Resp 12  Ht 5\' 9"  (1.753 m)  Wt 200 lb (90.719 kg)  BMI 29.52 kg/m2  SpO2 95% Wt Readings from Last 3 Encounters:  03/09/15 200 lb (90.719 kg)  09/08/14 199 lb 8 oz (90.493 kg)  08/29/14 201 lb 4 oz (91.286 kg)     Lab Results  Component Value Date   WBC 5.0 03/09/2015   HGB 14.1 03/09/2015   HCT 42.5 03/09/2015   PLT 156.0 03/09/2015   GLUCOSE 99 03/09/2015   CHOL 157 03/09/2015   TRIG 102.0  03/09/2015   HDL 45.90 03/09/2015   LDLDIRECT 143.5 08/26/2013   LDLCALC 91 03/09/2015   ALT 10 03/09/2015   AST 16 03/09/2015   NA 139 03/09/2015   K 4.7 03/09/2015   CL 102 03/09/2015   CREATININE 1.09 03/09/2015   BUN 21 03/09/2015   CO2 29 03/09/2015   TSH 0.88 06/16/2014       Assessment & Plan:   Problem List Items Addressed This Visit    Anemia    Follow cbc.  Recheck today.       Relevant Orders   CBC with Differential/Platelet (Completed)   Bladder cancer    Seeing Dr Jacqlyn Larsen.  Just evaluated.  See his note for details.  No evidence of recurrent tumor.  Recommended f/u in one year.       CAD (coronary artery disease)    Recent heart cath revealed known CAD and cardiomyopathy.  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  Currently doing well.  Followed by cardiology.       Eaton-Lambert myasthenic syndrome    Continue mestinon and cellcept.  Check cbc today.  Continue f/u with Dr Nadara Mustard.       Relevant Orders   Basic metabolic panel (Completed)   Health care maintenance    Sees Dr Jacqlyn Larsen for his prostate checks.  10/2011 - colonoscopy - two hyperplastic rectal polyps and diverticulosis.        Hypercholesterolemia - Primary    Low cholesterol diet and exercise.  Follow lipid panel and liver function tests.  On pravastatin.       Relevant Orders   Lipid panel (Completed)   Hepatic function panel (Completed)   Rash    Erythematous rash under left axilla.  Try nystatin cream as directed.  Follow.            Joel Pheasant, MD

## 2015-03-10 ENCOUNTER — Encounter: Payer: Self-pay | Admitting: Internal Medicine

## 2015-03-10 DIAGNOSIS — R21 Rash and other nonspecific skin eruption: Secondary | ICD-10-CM | POA: Insufficient documentation

## 2015-03-10 DIAGNOSIS — Z0000 Encounter for general adult medical examination without abnormal findings: Secondary | ICD-10-CM | POA: Insufficient documentation

## 2015-03-10 NOTE — Assessment & Plan Note (Signed)
Seeing Dr Jacqlyn Larsen.  Just evaluated.  See his note for details.  No evidence of recurrent tumor.  Recommended f/u in one year.

## 2015-03-10 NOTE — Assessment & Plan Note (Signed)
Follow cbc.  Recheck today.   

## 2015-03-10 NOTE — Assessment & Plan Note (Signed)
Recent heart cath revealed known CAD and cardiomyopathy.  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  Currently doing well.  Followed by cardiology.

## 2015-03-10 NOTE — Assessment & Plan Note (Signed)
Erythematous rash under left axilla.  Try nystatin cream as directed.  Follow.

## 2015-03-10 NOTE — Assessment & Plan Note (Signed)
Sees Dr Jacqlyn Larsen for his prostate checks.  10/2011 - colonoscopy - two hyperplastic rectal polyps and diverticulosis.

## 2015-03-10 NOTE — Assessment & Plan Note (Signed)
Continue mestinon and cellcept.  Check cbc today.  Continue f/u with Dr Nadara Mustard.

## 2015-03-10 NOTE — Assessment & Plan Note (Signed)
Low cholesterol diet and exercise.  Follow lipid panel and liver function tests.  On pravastatin.   

## 2015-03-12 ENCOUNTER — Encounter: Payer: Self-pay | Admitting: *Deleted

## 2015-04-25 DIAGNOSIS — I10 Essential (primary) hypertension: Secondary | ICD-10-CM | POA: Insufficient documentation

## 2015-05-18 DIAGNOSIS — I071 Rheumatic tricuspid insufficiency: Secondary | ICD-10-CM

## 2015-05-18 DIAGNOSIS — I340 Nonrheumatic mitral (valve) insufficiency: Secondary | ICD-10-CM | POA: Insufficient documentation

## 2015-05-18 HISTORY — DX: Nonrheumatic mitral (valve) insufficiency: I34.0

## 2015-05-18 HISTORY — DX: Rheumatic tricuspid insufficiency: I07.1

## 2015-09-10 ENCOUNTER — Encounter: Payer: Self-pay | Admitting: Internal Medicine

## 2015-09-10 ENCOUNTER — Ambulatory Visit (INDEPENDENT_AMBULATORY_CARE_PROVIDER_SITE_OTHER): Payer: Medicare Other | Admitting: Internal Medicine

## 2015-09-10 VITALS — BP 120/60 | HR 61 | Temp 97.7°F | Resp 18 | Ht 68.0 in | Wt 207.5 lb

## 2015-09-10 DIAGNOSIS — Z0000 Encounter for general adult medical examination without abnormal findings: Secondary | ICD-10-CM | POA: Diagnosis not present

## 2015-09-10 DIAGNOSIS — L989 Disorder of the skin and subcutaneous tissue, unspecified: Secondary | ICD-10-CM

## 2015-09-10 DIAGNOSIS — G7080 Lambert-Eaton syndrome, unspecified: Secondary | ICD-10-CM

## 2015-09-10 DIAGNOSIS — E7800 Pure hypercholesterolemia, unspecified: Secondary | ICD-10-CM

## 2015-09-10 DIAGNOSIS — I2510 Atherosclerotic heart disease of native coronary artery without angina pectoris: Secondary | ICD-10-CM | POA: Diagnosis not present

## 2015-09-10 DIAGNOSIS — C679 Malignant neoplasm of bladder, unspecified: Secondary | ICD-10-CM

## 2015-09-10 DIAGNOSIS — D649 Anemia, unspecified: Secondary | ICD-10-CM

## 2015-09-10 NOTE — Progress Notes (Signed)
Patient ID: [name], male   DOB: 1926-03-13, 81 y.o.   MRN: [id number]   Subjective:    Patient ID: [name], male    DOB: 01-22-1926, 81 y.o.   MRN: [id number]  HPI  Patient with past history of hypercholesterolemia, anemia, hypertension, Eaton-Lamber Syndrome and myastenia gravis and bladder cancer.  He comes in today to follow up on these issues as well as for a complete physical exam.  He states he feels he is doing well.  No chest pain or tightness.  Breathing stable.  Saw Dr Nehemiah Massed.  Stable.  Has f/u 09/21/15.  Seeing dermatology.  Has a new lesion on his left cheek.  No nausea or vomiting.  Bowels stable.  Tries to stay active.  Discussed diet and staying active.     Past Medical History  Diagnosis Date  . Hypercholesterolemia   . Anemia   . Essential hypertension, benign   . Eaton-Lambert syndrome ([place])   . Myasthenia gravis ([place])   . Chicken pox   . Allergy   . Colon polyp   . Shingles 2013  . Cancer ([place]) 11-24-12    bladder. BCG treatments by Dr Jacqlyn Larsen.   Past Surgical History  Procedure Laterality Date  . Appendectomy  1958  . Cholecystectomy  1979  . Tonsillectomy  1958  . Hernia repair  1970   Family History  Problem Relation Age of Onset  . Lung cancer Sister   . Prostate cancer Brother   . Colon cancer Neg Hx   . Lung cancer Brother   . Arthritis      parent   Social History   Social History  . Marital Status: Married    Spouse Name: N/A  . Number of Children: N/A  . Years of Education: N/A   Social History Main Topics  . Smoking status: Former Smoker    Quit date: 09/29/1958  . Smokeless tobacco: Never Used  . Alcohol Use: 0.0 oz/week    0 Standard drinks or equivalent per week     Comment: occasionally  . Drug Use: No  . Sexual Activity: No   Other Topics Concern  . None   Social History Narrative   Pt is married with 2 children - 1 son, 1 daughter. Previously self-employed in [employer]    Outpatient Encounter  Prescriptions as of 09/10/2015  Medication Sig  . aspirin 81 MG tablet Take 81 mg by mouth daily.  . bimatoprost (LUMIGAN) 0.01 % SOLN Place 1 drop into the right eye every other day.   Marland Kitchen dextromethorphan (DELSYM) 30 MG/5ML liquid Take by mouth as needed for cough.  . finasteride (PROSCAR) 5 MG tablet Take 5 mg by mouth daily.  . furosemide (LASIX) 20 MG tablet Take 20 mg by mouth daily.  . isosorbide mononitrate (IMDUR) 30 MG 24 hr tablet Take 30 mg by mouth daily.  Marland Kitchen lisinopril (PRINIVIL,ZESTRIL) 5 MG tablet Take 5 mg by mouth daily.  . metoprolol tartrate (LOPRESSOR) 25 MG tablet Take 12.5 mg by mouth 2 (two) times daily.  . mycophenolate (CELLCEPT) 250 MG capsule Take 1,000 mg by mouth 2 (two) times daily.   . pravastatin (PRAVACHOL) 20 MG tablet Take 1 tablet (20 mg total) by mouth daily.  Marland Kitchen pyridostigmine (MESTINON) 60 MG tablet Take 120 mg by mouth daily.  . tamsulosin (FLOMAX) 0.4 MG CAPS Take 0.4 mg by mouth daily.  . [DISCONTINUED] amoxicillin (AMOXIL) 875 MG tablet Take 1 tablet (875 mg total) by  mouth 2 (two) times daily. (Patient not taking: Reported on 03/09/2015)  . [DISCONTINUED] nystatin cream (MYCOSTATIN) Apply 1 application topically 2 (two) times daily.   No facility-administered encounter medications on file as of 09/10/2015.    Review of Systems  Constitutional: Negative for appetite change and unexpected weight change.  HENT: Negative for congestion and sinus pressure.   Eyes: Negative for pain and visual disturbance.  Respiratory: Negative for cough, chest tightness and shortness of breath.   Cardiovascular: Negative for chest pain, palpitations and leg swelling.  Gastrointestinal: Negative for nausea, vomiting, abdominal pain and diarrhea.  Genitourinary: Negative for dysuria and difficulty urinating.       Occasionally will noticed some increased urination, but this is intermittent.  Not a significant issue for him.   Musculoskeletal: Negative for back pain and  joint swelling.  Skin: Negative for color change and rash.  Neurological: Negative for dizziness, light-headedness and headaches.  Hematological: Negative for adenopathy. Does not bruise/bleed easily.  Psychiatric/Behavioral: Negative for dysphoric mood and agitation.       Objective:     Blood pressure rechecked by me:  132/72  Physical Exam  Constitutional: He is oriented to person, place, and time. He appears well-developed and well-nourished. No distress.  HENT:  Head: Normocephalic and atraumatic.  Nose: Nose normal.  Mouth/Throat: Oropharynx is clear and moist. No oropharyngeal exudate.  Eyes: Conjunctivae are normal. Right eye exhibits no discharge. Left eye exhibits no discharge.  Neck: Neck supple. No thyromegaly present.  Cardiovascular: Normal rate and regular rhythm.   Pulmonary/Chest: Breath sounds normal. No respiratory distress. He has no wheezes.  Abdominal: Soft. Bowel sounds are normal. There is no tenderness.  Genitourinary:  Followed by Dr Jacqlyn Larsen  Musculoskeletal: He exhibits no edema or tenderness.  Lymphadenopathy:    He has no cervical adenopathy.  Neurological: He is alert and oriented to person, place, and time.  Skin: Skin is warm and dry. No rash noted.  Psychiatric: He has a normal mood and affect. His behavior is normal.    BP 120/60 mmHg  Pulse 61  Temp(Src) 97.7 F (36.5 C) (Oral)  Resp 18  Ht 5\' 8"  (1.727 m)  Wt 207 lb 8 oz (94.121 kg)  BMI 31.56 kg/m2  SpO2 97% Wt Readings from Last 3 Encounters:  09/10/15 207 lb 8 oz (94.121 kg)  03/09/15 200 lb (90.719 kg)  09/08/14 199 lb 8 oz (90.493 kg)     Lab Results  Component Value Date   WBC 5.0 03/09/2015   HGB 14.1 03/09/2015   HCT 42.5 03/09/2015   PLT 156.0 03/09/2015   GLUCOSE 99 03/09/2015   CHOL 157 03/09/2015   TRIG 102.0 03/09/2015   HDL 45.90 03/09/2015   LDLDIRECT 143.5 08/26/2013   LDLCALC 91 03/09/2015   ALT 10 03/09/2015   AST 16 03/09/2015   NA 139 03/09/2015   K  4.7 03/09/2015   CL 102 03/09/2015   CREATININE 1.09 03/09/2015   BUN 21 03/09/2015   CO2 29 03/09/2015   TSH 0.88 06/16/2014       Assessment & Plan:   Problem List Items Addressed This Visit    Anemia    Follow cbc.       Bladder cancer [facility])    Seeing Dr Jacqlyn Larsen.  Stable.       CAD (coronary artery disease)    Elected medical management.  Followed by cardiology.  Stable.       Relevant Orders   TSH  Basic metabolic panel   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[facility])    On mestinon and cellcept.  Follow cbc.  Followed by Dr Nadara Mustard.  Stable.        Relevant Orders   CBC with Differential/Platelet   Health care maintenance    Followed by Dr Jacqlyn Larsen for his prostate checks.  Physical today 09/10/15.  Colonoscopy 10/2011 - two hyperplastic rectal polyps and diverticulosis.        Hypercholesterolemia    Low cholesterol diet and exercise.  Follow lipid panel and liver function tests.  On pravastatin.        Relevant Orders   Lipid panel   Hepatic function panel   Skin lesion of cheek    Present for a couple of months now.  Refer back to Dr Nehemiah Massed.         Other Visit Diagnoses    Facial skin lesion    -  Primary    Relevant Orders    Ambulatory referral to Dermatology        [name], MD

## 2015-09-10 NOTE — Progress Notes (Addendum)
Subjective:   [name] is a 81 y.o. male who presents for an Initial Medicare Annual Wellness Visit.  Review of Systems  No ROS.  Medicare Wellness Visit. Cardiac Risk Factors include: advanced age (>37men, >37 women);male gender    Objective:    Today's Vitals   09/10/15 1039  BP: 120/60  Pulse: 61  Temp: 97.7 F (36.5 C)  TempSrc: Oral  Resp: 18  Height: 5\' 8"  (1.727 m)  Weight: 207 lb 8 oz (94.121 kg)  SpO2: 97%    Current Medications (verified) Outpatient Encounter Prescriptions as of 09/10/2015  Medication Sig  . aspirin 81 MG tablet Take 81 mg by mouth daily.  . bimatoprost (LUMIGAN) 0.01 % SOLN Place 1 drop into the right eye every other day.   Marland Kitchen dextromethorphan (DELSYM) 30 MG/5ML liquid Take by mouth as needed for cough.  . finasteride (PROSCAR) 5 MG tablet Take 5 mg by mouth daily.  . furosemide (LASIX) 20 MG tablet Take 20 mg by mouth daily.  . isosorbide mononitrate (IMDUR) 30 MG 24 hr tablet Take 30 mg by mouth daily.  Marland Kitchen lisinopril (PRINIVIL,ZESTRIL) 5 MG tablet Take 5 mg by mouth daily.  . metoprolol tartrate (LOPRESSOR) 25 MG tablet Take 12.5 mg by mouth 2 (two) times daily.  . mycophenolate (CELLCEPT) 250 MG capsule Take 1,000 mg by mouth 2 (two) times daily.   . pravastatin (PRAVACHOL) 20 MG tablet Take 1 tablet (20 mg total) by mouth daily.  Marland Kitchen pyridostigmine (MESTINON) 60 MG tablet Take 120 mg by mouth daily.  . tamsulosin (FLOMAX) 0.4 MG CAPS Take 0.4 mg by mouth daily.  . [DISCONTINUED] amoxicillin (AMOXIL) 875 MG tablet Take 1 tablet (875 mg total) by mouth 2 (two) times daily. (Patient not taking: Reported on 03/09/2015)  . [DISCONTINUED] nystatin cream (MYCOSTATIN) Apply 1 application topically 2 (two) times daily.   No facility-administered encounter medications on file as of 09/10/2015.    Allergies (verified) Other; Ciprofloxacin; and Sulfa antibiotics   History: Past Medical History  Diagnosis Date  . Hypercholesterolemia   .  Anemia   . Essential hypertension, benign   . 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 Myasthenia gravis ([place])   . Chicken pox   . Allergy   . Colon polyp   . Shingles 2013  . 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 BCG treatments by Dr Jacqlyn Larsen.   Past Surgical History  Procedure Laterality Date  . Appendectomy  1958  . Cholecystectomy  1979  . Tonsillectomy  1958  . Hernia repair  1970   Family History  Problem Relation Age of Onset  . Lung cancer Sister   . Prostate cancer Brother   . Colon cancer Neg Hx   . Lung cancer Brother   . Arthritis      parent   Social History   Occupational History  . Not on file.   Social History Main Topics  . Smoking status: Former Smoker    Quit date: 09/29/1958  . Smokeless tobacco: Never Used  . Alcohol Use: 0.0 oz/week    0 Standard drinks or equivalent per week     Comment: occasionally  . Drug Use: No  . Sexual Activity: No   Tobacco Counseling Counseling given: Not Answered   Activities of Daily Living In your present state of health, do you have any difficulty performing the following activities: 09/10/2015  Hearing? N  Vision? Y  Difficulty concentrating or making decisions? N  Walking or climbing stairs?  N  Dressing or bathing? N  Doing errands, shopping? N  Preparing Food and eating ? N  Using the Toilet? N  In the past six months, have you accidently leaked urine? Y  Do you have problems with loss of bowel control? N  Managing your Medications? N  Managing your Finances? N  Housekeeping or managing your Housekeeping? N    Immunizations and Health Maintenance Immunization History  Administered Date(s) Administered  . Influenza Split 06/29/2013  . Influenza,inj,Quad PF,36+ Mos 07/26/2014  . Influenza-Unspecified 07/06/2015   There are no preventive care reminders to display for this patient.  Patient Care Team: [name], MD as PCP - General (Internal Medicine) [name], MD (Internal Medicine) Robert Bellow, MD (General Surgery)  Indicate any recent Medical Services you may have received from other than [facility] providers in the past year (date may be approximate).    Assessment:   This is a routine wellness examination for [place]. The goal of the wellness visit is to assist the patient how to close the gaps in care and create a preventative care plan for the patient.   Osteoporosis risk reviewed.  Medications reviewed; taking without issues or barriers.  Safety issues reviewed; smoke detectors in the home. No firearms in the home. Wears seatbelts when driving or riding with others. No violence in the home.  No identified risk were noted; The patient was oriented x 3; appropriate in dress and manner and no objective failures at ADL's or IADL's.   TDAP postponed per patient request.  Not currently a candidate for Zoster vaccination.  ZOSTAVAX postponed.  Patient Concerns: None at this time.  Follow up as needed.   Hearing/Vision screen Hearing Screening Comments: Passes the whisper test Vision Screening Comments: Followed by [facility], Dr. Sandra Cockayne Frequent office visits due to Shingles in the R eye R cataract removed Wears glasses  Dietary issues and exercise activities discussed: Current Exercise Habits:: Home exercise routine (Cuts wood and does yard work.), Intensity: Mild  Goals    . Increase physical activity     Incorporate chair exercises as demonstrated, as tolerated.  Education provided.      Depression Screen PHQ 2/9 Scores 09/10/2015 09/10/2015 03/28/2014 03/05/2013  PHQ - 2 Score 0 0 0 0    Fall Risk Fall Risk  09/10/2015 09/10/2015 03/28/2014 03/05/2013 08/30/2012  Falls in the past year? No No No No No    Cognitive Function: MMSE - Mini Mental State Exam 09/10/2015  Orientation to time 5  Orientation to Place 5  Registration 3  Attention/ Calculation 5  Recall 3  Language- name 2 objects 2  Language- repeat 1  Language- follow 3 step  command 3  Language- read & follow direction 1  Write a sentence 1  Copy design 1  Total score 30    Screening Tests Health Maintenance  Topic Date Due  . PNA vac Low Risk Adult (1 of 2 - PCV13) 11/11/2015 (Originally 09/04/1993)  . TETANUS/TDAP  03/10/2016 (Originally 09[birth year]05/1947)  . ZOSTAVAX  09/09/2016 (Originally 09/04/1988)  . INFLUENZA VACCINE  04/29/2016        Plan:   End of life planning; Advance aging; Advanced directives discussed. HCPOA complete. Copy requested.   During the course of the visit Jeramy was educated and counseled about the following appropriate screening and preventive services:   Vaccines to include Pneumoccal, Influenza, Hepatitis B, Td, Zostavax, HCV  Electrocardiogram  Colorectal cancer screening  Cardiovascular disease screening  Diabetes screening  Glaucoma screening  Nutrition counseling  Prostate cancer screening  Smoking cessation counseling  Patient Instructions (the written plan) were given to the patient.   OBrien-Blaney, Shandon Burlingame L, LPN   [id number]     Pt not a candidate for shingles vaccine.  Pt notified.  Reviewed above information.    Dr Nicki Reaper

## 2015-09-10 NOTE — Progress Notes (Signed)
Pre-visit discussion using our clinic review tool. No additional management support is needed unless otherwise documented below in the visit note.  

## 2015-09-10 NOTE — Patient Instructions (Addendum)
[name],  Thank you for taking time to come for your Medicare Wellness Visit.  I appreciate your ongoing commitment to your health goals. Please review the following plan we discussed and let me know if I can assist you in the future.  Happy Holidays!   Health Maintenance, Male A healthy lifestyle and preventative care can promote health and wellness.  Maintain regular health, dental, and eye exams.  Eat a healthy diet. Foods like vegetables, fruits, whole grains, low-fat dairy products, and lean protein foods contain the nutrients you need and are low in calories. Decrease your intake of foods high in solid fats, added sugars, and salt. Get information about a proper diet from your health care provider, if necessary.  Regular physical exercise is one of the most important things you can do for your health. Most adults should get at least 150 minutes of moderate-intensity exercise (any activity that increases your heart rate and causes you to sweat) each week. In addition, most adults need muscle-strengthening exercises on 2 or more days a week.   Maintain a healthy weight. The body mass index (BMI) is a screening tool to identify possible weight problems. It provides an estimate of body fat based on height and weight. Your health care provider can find your BMI and can help you achieve or maintain a healthy weight. For males 20 years and older:  A BMI below 18.5 is considered underweight.  A BMI of 18.5 to 24.9 is normal.  A BMI of 25 to 29.9 is considered overweight.  A BMI of 30 and above is considered obese.  Maintain normal blood lipids and cholesterol by exercising and minimizing your intake of saturated fat. Eat a balanced diet with plenty of fruits and vegetables. Blood tests for lipids and cholesterol should begin at age 26 and be repeated every 5 years. If your lipid or cholesterol levels are high, you are over age 13, or you are at high risk for heart disease, you may need  your cholesterol levels checked more frequently.Ongoing high lipid and cholesterol levels should be treated with medicines if diet and exercise are not working.  If you smoke, find out from your health care provider how to quit. If you do not use tobacco, do not start.  Lung cancer screening is recommended for adults aged 8-80 years who are at high risk for developing lung cancer because of a history of smoking. A yearly low-dose CT scan of the lungs is recommended for people who have at least a 30-pack-year history of smoking and are current smokers or have quit within the past 15 years. A pack year of smoking is smoking an average of 1 pack of cigarettes a day for 1 year (for example, a 30-pack-year history of smoking could mean smoking 1 pack a day for 30 years or 2 packs a day for 15 years). Yearly screening should continue until the smoker has stopped smoking for at least 15 years. Yearly screening should be stopped for people who develop a health problem that would prevent them from having lung cancer treatment.  If you choose to drink alcohol, do not have more than 2 drinks per day. One drink is considered to be 12 oz (360 mL) of beer, 5 oz (150 mL) of wine, or 1.5 oz (45 mL) of liquor.  Avoid the use of street drugs. Do not share needles with anyone. Ask for help if you need support or instructions about stopping the use of drugs.  High blood  pressure causes heart disease and increases the risk of stroke. High blood pressure is more likely to develop in:  People who have blood pressure in the end of the normal range (100-139/85-89 mm Hg).  People who are overweight or obese.  People who are African American.  If you are 56-9 years of age, have your blood pressure checked every 3-5 years. If you are 89 years of age or older, have your blood pressure checked every year. You should have your blood pressure measured twice--once when you are at a hospital or clinic, and once when you are not  at a hospital or clinic. Record the average of the two measurements. To check your blood pressure when you are not at a hospital or clinic, you can use:  An automated blood pressure machine at a pharmacy.  A home blood pressure monitor.  If you are 74-60 years old, ask your health care provider if you should take aspirin to prevent heart disease.  Diabetes screening involves taking a blood sample to check your fasting blood sugar level. This should be done once every 3 years after age 34 if you are at a normal weight and without risk factors for diabetes. Testing should be considered at a younger age or be carried out more frequently if you are overweight and have at least 1 risk factor for diabetes.  Colorectal cancer can be detected and often prevented. Most routine colorectal cancer screening begins at the age of 15 and continues through age 32. However, your health care provider may recommend screening at an earlier age if you have risk factors for colon cancer. On a yearly basis, your health care provider may provide home test kits to check for hidden blood in the stool. A small camera at the end of a tube may be used to directly examine the colon (sigmoidoscopy or colonoscopy) to detect the earliest forms of colorectal cancer. Talk to your health care provider about this at age 66 when routine screening begins. A direct exam of the colon should be repeated every 5-10 years through age 14, unless early forms of precancerous polyps or small growths are found.  People who are at an increased risk for hepatitis B should be screened for this virus. You are considered at high risk for hepatitis B if:  You were born in a country where hepatitis B occurs often. Talk with your health care provider about which countries are considered high risk.  Your parents were born in a high-risk country and you have not received a shot to protect against hepatitis B (hepatitis B vaccine).  You have HIV or  AIDS.  You use needles to inject street drugs.  You live with, or have sex with, someone who has hepatitis B.  You are a man who has sex with other men (MSM).  You get hemodialysis treatment.  You take certain medicines for conditions like cancer, organ transplantation, and autoimmune conditions.  Hepatitis C blood testing is recommended for all people born from [birth year] through 1963 and any individual with known risk factors for hepatitis C.  Healthy men should no longer receive prostate-specific antigen (PSA) blood tests as part of routine cancer screening. Talk to your health care provider about prostate cancer screening.  Testicular cancer screening is not recommended for adolescents or adult males who have no symptoms. Screening includes self-exam, a health care provider exam, and other screening tests. Consult with your health care provider about any symptoms you have or any concerns you  have about testicular cancer.  Practice safe sex. Use condoms and avoid high-risk sexual practices to reduce the spread of sexually transmitted infections (STIs).  You should be screened for STIs, including gonorrhea and chlamydia if:  You are sexually active and are younger than 24 years.  You are older than 24 years, and your health care provider tells you that you are at risk for this type of infection.  Your sexual activity has changed since you were last screened, and you are at an increased risk for chlamydia or gonorrhea. Ask your health care provider if you are at risk.  If you are at risk of being infected with HIV, it is recommended that you take a prescription medicine daily to prevent HIV infection. This is called pre-exposure prophylaxis (PrEP). You are considered at risk if:  You are a man who has sex with other men (MSM).  You are a heterosexual man who is sexually active with multiple partners.  You take drugs by injection.  You are sexually active with a partner who has  HIV.  Talk with your health care provider about whether you are at high risk of being infected with HIV. If you choose to begin PrEP, you should first be tested for HIV. You should then be tested every 3 months for as long as you are taking PrEP.  Use sunscreen. Apply sunscreen liberally and repeatedly throughout the day. You should seek shade when your shadow is shorter than you. Protect yourself by wearing long sleeves, pants, a wide-brimmed hat, and sunglasses year round whenever you are outdoors.  Tell your health care provider of new moles or changes in moles, especially if there is a change in shape or color. Also, tell your health care provider if a mole is larger than the size of a pencil eraser.  A one-time screening for abdominal aortic aneurysm (AAA) and surgical repair of large AAAs by ultrasound is recommended for men aged 80-75 years who are current or former smokers.  Stay current with your vaccines (immunizations).   This information is not intended to replace advice given to you by your health care provider. Make sure you discuss any questions you have with your health care provider.   Document Released: 03/13/2008 Document Revised: 10/06/2014 Document Reviewed: 02/10/2011 Elsevier Interactive Patient Education [employer].

## 2015-09-11 ENCOUNTER — Encounter: Payer: Self-pay | Admitting: Internal Medicine

## 2015-09-11 DIAGNOSIS — L989 Disorder of the skin and subcutaneous tissue, unspecified: Secondary | ICD-10-CM | POA: Insufficient documentation

## 2015-09-11 NOTE — Assessment & Plan Note (Signed)
Present for a couple of months now.  Refer back to Dr Nehemiah Massed.

## 2015-09-11 NOTE — Assessment & Plan Note (Signed)
Followed by Dr Jacqlyn Larsen for his prostate checks.  Physical today 09/10/15.  Colonoscopy 10/2011 - two hyperplastic rectal polyps and diverticulosis.

## 2015-09-11 NOTE — Assessment & Plan Note (Signed)
On mestinon and cellcept.  Follow cbc.  Followed by Dr Nadara Mustard.  Stable.

## 2015-09-11 NOTE — Assessment & Plan Note (Signed)
Low cholesterol diet and exercise.  Follow lipid panel and liver function tests.  On pravastatin.   

## 2015-09-11 NOTE — Assessment & Plan Note (Signed)
Seeing Dr Cope.  Stable.  

## 2015-09-11 NOTE — Assessment & Plan Note (Signed)
Follow cbc.  

## 2015-09-11 NOTE — Assessment & Plan Note (Signed)
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  Followed by cardiology.  Stable.

## 2015-09-12 ENCOUNTER — Telehealth: Payer: Self-pay | Admitting: *Deleted

## 2015-09-12 NOTE — Telephone Encounter (Signed)
Pt states that he checked with his urologist & he is okay to have the pneumonia vaccine.

## 2015-09-12 NOTE — Telephone Encounter (Signed)
Please make sure he cleared it with Dr Nadara Mustard also.  He is the one treating his Myasthenia Gravis and his [name].  Will need to be cleared by him as well.  (not just Dr Jacqlyn Larsen)  Thanks

## 2015-09-12 NOTE — Telephone Encounter (Signed)
Left patient a detailed voicemail.

## 2015-09-14 ENCOUNTER — Other Ambulatory Visit: Payer: Self-pay | Admitting: *Deleted

## 2015-09-14 MED ORDER — NYSTATIN 100000 UNIT/GM EX CREA: 1.0000 "application " | TOPICAL_CREAM | Freq: Two times a day (BID) | CUTANEOUS | Status: DC

## 2015-09-28 ENCOUNTER — Other Ambulatory Visit (INDEPENDENT_AMBULATORY_CARE_PROVIDER_SITE_OTHER): Payer: Medicare Other

## 2015-09-28 DIAGNOSIS — E7800 Pure hypercholesterolemia, unspecified: Secondary | ICD-10-CM | POA: Diagnosis not present

## 2015-09-28 DIAGNOSIS — G7080 Lambert-Eaton syndrome, unspecified: Secondary | ICD-10-CM | POA: Diagnosis not present

## 2015-09-28 DIAGNOSIS — I2510 Atherosclerotic heart disease of native coronary artery without angina pectoris: Secondary | ICD-10-CM

## 2015-09-28 LAB — CBC WITH DIFFERENTIAL/PLATELET
BASOS ABS: 0 10*3/uL (ref 0.0–0.1)
Basophils Relative: 0.6 % (ref 0.0–3.0)
EOS ABS: 0.1 10*3/uL (ref 0.0–0.7)
Eosinophils Relative: 1.3 % (ref 0.0–5.0)
HCT: 42.7 % (ref 39.0–52.0)
Hemoglobin: 13.6 g/dL (ref 13.0–17.0)
LYMPHS ABS: 1 10*3/uL (ref 0.7–4.0)
Lymphocytes Relative: 21.7 % (ref 12.0–46.0)
MCHC: 31.9 g/dL (ref 30.0–36.0)
MCV: 90.8 fl (ref 78.0–100.0)
MONO ABS: 0.4 10*3/uL (ref 0.1–1.0)
Monocytes Relative: 9.5 % (ref 3.0–12.0)
NEUTROS PCT: 66.9 % (ref 43.0–77.0)
Neutro Abs: 3.1 10*3/uL (ref 1.4–7.7)
Platelets: 163 10*3/uL (ref 150.0–400.0)
RBC: 4.7 Mil/uL (ref 4.22–5.81)
RDW: 14.3 % (ref 11.5–15.5)
WBC: 4.7 10*3/uL (ref 4.0–10.5)

## 2015-09-28 LAB — LIPID PANEL
CHOLESTEROL: 147 mg/dL (ref 0–200)
HDL: 43.4 mg/dL (ref >39.00)
LDL Cholesterol: 89 mg/dL (ref 0–99)
NonHDL: 103.75
TRIGLYCERIDES: 76 mg/dL (ref 0.0–149.0)
Total CHOL/HDL Ratio: 3
VLDL: 15.2 mg/dL (ref 0.0–40.0)

## 2015-09-28 LAB — BASIC METABOLIC PANEL
BUN: 19 mg/dL (ref 6–23)
CALCIUM: 9.2 mg/dL (ref 8.4–10.5)
CO2: 32 meq/L (ref 19–32)
CREATININE: 0.99 mg/dL (ref 0.40–1.50)
Chloride: 104 mEq/L (ref 96–112)
GFR: 75.99 mL/min (ref >60.00)
GLUCOSE: 106 mg/dL — AB (ref 70–99)
Potassium: 5 mEq/L (ref 3.5–5.1)
Sodium: 140 mEq/L (ref 135–145)

## 2015-09-28 LAB — TSH: TSH: 1.84 u[IU]/mL (ref 0.35–4.50)

## 2015-09-28 LAB — HEPATIC FUNCTION PANEL
ALBUMIN: 3.9 g/dL (ref 3.5–5.2)
ALK PHOS: 56 U/L (ref 39–117)
ALT: 10 U/L (ref 0–53)
AST: 13 U/L (ref 0–37)
Bilirubin, Direct: 0.1 mg/dL (ref 0.0–0.3)
Total Bilirubin: 0.5 mg/dL (ref 0.2–1.2)
Total Protein: 6.2 g/dL (ref 6.0–8.3)

## 2015-10-02 ENCOUNTER — Encounter: Payer: Self-pay | Admitting: *Deleted

## 2015-10-02 ENCOUNTER — Ambulatory Visit (INDEPENDENT_AMBULATORY_CARE_PROVIDER_SITE_OTHER): Payer: Medicare Other | Admitting: *Deleted

## 2015-10-02 DIAGNOSIS — Z23 Encounter for immunization: Secondary | ICD-10-CM

## 2015-11-08 DIAGNOSIS — B0233 Zoster keratitis: Secondary | ICD-10-CM | POA: Diagnosis not present

## 2015-11-09 DIAGNOSIS — Z79899 Other long term (current) drug therapy: Secondary | ICD-10-CM | POA: Diagnosis not present

## 2015-11-09 DIAGNOSIS — Z5181 Encounter for therapeutic drug level monitoring: Secondary | ICD-10-CM | POA: Diagnosis not present

## 2015-11-09 DIAGNOSIS — G7000 Myasthenia gravis without (acute) exacerbation: Secondary | ICD-10-CM | POA: Diagnosis not present

## 2015-11-22 DIAGNOSIS — C44329 Squamous cell carcinoma of skin of other parts of face: Secondary | ICD-10-CM | POA: Diagnosis not present

## 2015-11-22 DIAGNOSIS — L578 Other skin changes due to chronic exposure to nonionizing radiation: Secondary | ICD-10-CM | POA: Diagnosis not present

## 2015-11-22 DIAGNOSIS — L821 Other seborrheic keratosis: Secondary | ICD-10-CM | POA: Diagnosis not present

## 2015-11-22 DIAGNOSIS — C4492 Squamous cell carcinoma of skin, unspecified: Secondary | ICD-10-CM

## 2015-11-22 DIAGNOSIS — L820 Inflamed seborrheic keratosis: Secondary | ICD-10-CM | POA: Diagnosis not present

## 2015-11-22 DIAGNOSIS — D485 Neoplasm of uncertain behavior of skin: Secondary | ICD-10-CM | POA: Diagnosis not present

## 2015-11-22 HISTORY — DX: Squamous cell carcinoma of skin, unspecified: C44.92

## 2016-01-14 DIAGNOSIS — L578 Other skin changes due to chronic exposure to nonionizing radiation: Secondary | ICD-10-CM | POA: Diagnosis not present

## 2016-01-14 DIAGNOSIS — C44329 Squamous cell carcinoma of skin of other parts of face: Secondary | ICD-10-CM | POA: Diagnosis not present

## 2016-01-14 DIAGNOSIS — L821 Other seborrheic keratosis: Secondary | ICD-10-CM | POA: Diagnosis not present

## 2016-01-14 DIAGNOSIS — L820 Inflamed seborrheic keratosis: Secondary | ICD-10-CM | POA: Diagnosis not present

## 2016-01-14 DIAGNOSIS — D485 Neoplasm of uncertain behavior of skin: Secondary | ICD-10-CM | POA: Diagnosis not present

## 2016-01-29 DIAGNOSIS — N3941 Urge incontinence: Secondary | ICD-10-CM | POA: Diagnosis not present

## 2016-01-29 DIAGNOSIS — N401 Enlarged prostate with lower urinary tract symptoms: Secondary | ICD-10-CM | POA: Diagnosis not present

## 2016-01-29 DIAGNOSIS — C672 Malignant neoplasm of lateral wall of bladder: Secondary | ICD-10-CM | POA: Diagnosis not present

## 2016-02-05 DIAGNOSIS — C44329 Squamous cell carcinoma of skin of other parts of face: Secondary | ICD-10-CM | POA: Diagnosis not present

## 2016-02-07 DIAGNOSIS — B0233 Zoster keratitis: Secondary | ICD-10-CM | POA: Diagnosis not present

## 2016-02-11 DIAGNOSIS — I5022 Chronic systolic (congestive) heart failure: Secondary | ICD-10-CM | POA: Diagnosis not present

## 2016-02-11 DIAGNOSIS — R001 Bradycardia, unspecified: Secondary | ICD-10-CM | POA: Diagnosis not present

## 2016-02-11 DIAGNOSIS — I2510 Atherosclerotic heart disease of native coronary artery without angina pectoris: Secondary | ICD-10-CM | POA: Diagnosis not present

## 2016-02-11 DIAGNOSIS — I6523 Occlusion and stenosis of bilateral carotid arteries: Secondary | ICD-10-CM | POA: Diagnosis not present

## 2016-02-11 DIAGNOSIS — R0602 Shortness of breath: Secondary | ICD-10-CM | POA: Diagnosis not present

## 2016-02-18 DIAGNOSIS — R0602 Shortness of breath: Secondary | ICD-10-CM | POA: Diagnosis not present

## 2016-02-18 DIAGNOSIS — I5022 Chronic systolic (congestive) heart failure: Secondary | ICD-10-CM | POA: Diagnosis not present

## 2016-02-26 DIAGNOSIS — D494 Neoplasm of unspecified behavior of bladder: Secondary | ICD-10-CM | POA: Diagnosis not present

## 2016-02-26 DIAGNOSIS — C672 Malignant neoplasm of lateral wall of bladder: Secondary | ICD-10-CM | POA: Diagnosis not present

## 2016-02-28 DIAGNOSIS — I2510 Atherosclerotic heart disease of native coronary artery without angina pectoris: Secondary | ICD-10-CM | POA: Diagnosis not present

## 2016-02-28 DIAGNOSIS — R001 Bradycardia, unspecified: Secondary | ICD-10-CM | POA: Diagnosis not present

## 2016-02-28 DIAGNOSIS — I5022 Chronic systolic (congestive) heart failure: Secondary | ICD-10-CM | POA: Diagnosis not present

## 2016-03-10 ENCOUNTER — Encounter: Payer: Self-pay | Admitting: Internal Medicine

## 2016-03-10 ENCOUNTER — Ambulatory Visit (INDEPENDENT_AMBULATORY_CARE_PROVIDER_SITE_OTHER): Payer: Medicare Other | Admitting: Internal Medicine

## 2016-03-10 VITALS — BP 110/60 | HR 66 | Temp 97.8°F | Resp 18 | Ht 68.0 in | Wt 205.0 lb

## 2016-03-10 DIAGNOSIS — E7800 Pure hypercholesterolemia, unspecified: Secondary | ICD-10-CM | POA: Diagnosis not present

## 2016-03-10 DIAGNOSIS — C679 Malignant neoplasm of bladder, unspecified: Secondary | ICD-10-CM

## 2016-03-10 DIAGNOSIS — R29898 Other symptoms and signs involving the musculoskeletal system: Secondary | ICD-10-CM

## 2016-03-10 DIAGNOSIS — G7080 Lambert-Eaton syndrome, unspecified: Secondary | ICD-10-CM | POA: Diagnosis not present

## 2016-03-10 DIAGNOSIS — I2510 Atherosclerotic heart disease of native coronary artery without angina pectoris: Secondary | ICD-10-CM

## 2016-03-10 NOTE — Progress Notes (Signed)
Pre-visit discussion using our clinic review tool. No additional management support is needed unless otherwise documented below in the visit note.  

## 2016-03-10 NOTE — Progress Notes (Signed)
Patient ID: [name], male   DOB: [birth year], 82 y.o.   MRN: [id number]   Subjective:    Patient ID: [name], male    DOB: Aug 02, 1926, 82 y.o.   MRN: [id number]  HPI  Patient here for a scheduled follow up.  Recently evaluated by cardiology.  Recent lexiscan - negative for ischemia, EF 37%.  He stopped beta blocker.  States was having problems with leg weakness.  He would walk and felt his legs would give out.  No pain.  Just weakness.  Discussed with cardiology.  Had negative stress test as outlined.  Beta blocker stopped.  Feels better.  Weakness - improved.  No chest pain.  Feels breathing stable.  Swallowing ok.  Due to see Dr Nadara Mustard in 04/2016.  No nausea or vomiting.  Bowels stable.    Past Medical History  Diagnosis Date  . Hypercholesterolemia   . Anemia   . Essential hypertension, benign   . 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 Myasthenia gravis ([place])   . Chicken pox   . Allergy   . Colon polyp   . Shingles 2013  . 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 BCG treatments by Dr Jacqlyn Larsen.   Past Surgical History  Procedure Laterality Date  . Appendectomy  1958  . Cholecystectomy  1979  . Tonsillectomy  1958  . Hernia repair  1970   Family History  Problem Relation Age of Onset  . Lung cancer Sister   . Prostate cancer Brother   . Colon cancer Neg Hx   . Lung cancer Brother   . Arthritis      parent   Social History   Social History  . Marital Status: Married    Spouse Name: N/A  . Number of Children: N/A  . Years of Education: N/A   Social History Main Topics  . Smoking status: Former Smoker    Quit date: 09/29/1958  . Smokeless tobacco: Never Used  . Alcohol Use: 0.0 oz/week    0 Standard drinks or equivalent per week     Comment: occasionally  . Drug Use: No  . Sexual Activity: No   Other Topics Concern  . None   Social History Narrative   Pt is married with 2 children - 1 son, 1 daughter. Previously self-employed in [employer]    Outpatient  Encounter Prescriptions as of 03/10/2016  Medication Sig  . aspirin 81 MG tablet Take 81 mg by mouth daily.  . bimatoprost (LUMIGAN) 0.01 % SOLN Place 1 drop into the right eye every other day.   Marland Kitchen dextromethorphan (DELSYM) 30 MG/5ML liquid Take by mouth as needed for cough.  . finasteride (PROSCAR) 5 MG tablet Take 5 mg by mouth daily.  . furosemide (LASIX) 20 MG tablet Take 20 mg by mouth daily.  . isosorbide mononitrate (IMDUR) 30 MG 24 hr tablet Take 30 mg by mouth daily.  Marland Kitchen lisinopril (PRINIVIL,ZESTRIL) 5 MG tablet Take 5 mg by mouth daily.  . mycophenolate (CELLCEPT) 250 MG capsule Take 1,000 mg by mouth 2 (two) times daily.   Marland Kitchen nystatin cream (MYCOSTATIN) Apply 1 application topically 2 (two) times daily.  . pravastatin (PRAVACHOL) 20 MG tablet Take 1 tablet (20 mg total) by mouth daily.  Marland Kitchen pyridostigmine (MESTINON) 60 MG tablet Take 120 mg by mouth daily.  . tamsulosin (FLOMAX) 0.4 MG CAPS Take 0.4 mg by mouth daily.  . [DISCONTINUED] metoprolol tartrate (LOPRESSOR) 25 MG tablet Take 12.5 mg by mouth  2 (two) times daily.   No facility-administered encounter medications on file as of 03/10/2016.    Review of Systems  Constitutional: Negative for appetite change and unexpected weight change.  HENT: Negative for congestion and sinus pressure.   Respiratory: Negative for cough, chest tightness and shortness of breath.   Cardiovascular: Negative for chest pain, palpitations and leg swelling.  Gastrointestinal: Negative for nausea, vomiting, abdominal pain and diarrhea.  Genitourinary: Negative for dysuria and difficulty urinating.  Musculoskeletal: Negative for myalgias and joint swelling.       Leg weakness improved as outlined.   Skin: Negative for color change and rash.  Neurological: Negative for dizziness, light-headedness and headaches.  Psychiatric/Behavioral: Negative for dysphoric mood and agitation.       Objective:    Physical Exam  Constitutional: He appears  well-developed and well-nourished. No distress.  HENT:  Nose: Nose normal.  Mouth/Throat: Oropharynx is clear and moist.  Neck: Neck supple. No thyromegaly present.  Cardiovascular: Normal rate and regular rhythm.   Pulmonary/Chest: Effort normal and breath sounds normal. No respiratory distress.  Abdominal: Soft. Bowel sounds are normal. There is no tenderness.  Musculoskeletal: He exhibits no edema or tenderness.  Lymphadenopathy:    He has no cervical adenopathy.  Skin: No rash noted. No erythema.  Psychiatric: He has a normal mood and affect. His behavior is normal.    BP 110/60 mmHg  Pulse 66  Temp(Src) 97.8 F (36.6 C) (Oral)  Resp 18  Ht 5\' 8"  (1.727 m)  Wt 205 lb (92.987 kg)  BMI 31.18 kg/m2  SpO2 93% Wt Readings from Last 3 Encounters:  03/10/16 205 lb (92.987 kg)  09/10/15 207 lb 8 oz (94.121 kg)  03/09/15 200 lb (90.719 kg)     Lab Results  Component Value Date   WBC 4.7 09/28/2015   HGB 13.6 09/28/2015   HCT 42.7 09/28/2015   PLT 163.0 09/28/2015   GLUCOSE 106* 09/28/2015   CHOL 147 09/28/2015   TRIG 76.0 09/28/2015   HDL 43.40 09/28/2015   LDLDIRECT 143.5 08/26/2013   LDLCALC 89 09/28/2015   ALT 10 09/28/2015   AST 13 09/28/2015   NA 140 09/28/2015   K 5.0 09/28/2015   CL 104 09/28/2015   CREATININE 0.99 09/28/2015   BUN 19 09/28/2015   CO2 32 09/28/2015   TSH 1.84 09/28/2015       Assessment & Plan:   Problem List Items Addressed This Visit    Bladder cancer ([place])    Seeing Dr Jacqlyn Larsen.  Just evaluated.  Planning for f/u cystoscopy next visit.       Relevant Orders   Basic metabolic panel   CAD (coronary artery disease)    Followed by cardiology.  Recent lexi-scan - negative.  See cardiology note.  Continue medical management.        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  Recheck cbc.  Followed by Dr Nadara Mustard.  Stable.       Relevant Orders   CBC with Differential/Platelet   Hypercholesterolemia     On pravastatin.  Low cholesterol diet and exercise.  Follow lipid panel and liver function tests.       Relevant Orders   Lipid panel   Hepatic function panel   Leg weakness    Better since stopping beta blocker.            [name], MD

## 2016-03-11 ENCOUNTER — Encounter: Payer: Self-pay | Admitting: Internal Medicine

## 2016-03-11 DIAGNOSIS — R29898 Other symptoms and signs involving the musculoskeletal system: Secondary | ICD-10-CM | POA: Insufficient documentation

## 2016-03-11 NOTE — Assessment & Plan Note (Signed)
On pravastatin.  Low cholesterol diet and exercise.  Follow lipid panel and liver function tests.   

## 2016-03-11 NOTE — Assessment & Plan Note (Signed)
On mestinon and cellcept.  Recheck cbc.  Followed by Dr Nadara Mustard.  Stable.

## 2016-03-11 NOTE — Assessment & Plan Note (Signed)
Better since stopping beta blocker.

## 2016-03-11 NOTE — Assessment & Plan Note (Signed)
Seeing Dr Jacqlyn Larsen.  Just evaluated.  Planning for f/u cystoscopy next visit.

## 2016-03-11 NOTE — Assessment & Plan Note (Signed)
Followed by cardiology.  Recent lexi-scan - negative.  See cardiology note.  Continue medical management.

## 2016-03-25 ENCOUNTER — Other Ambulatory Visit: Payer: Self-pay | Admitting: Internal Medicine

## 2016-03-25 ENCOUNTER — Other Ambulatory Visit (INDEPENDENT_AMBULATORY_CARE_PROVIDER_SITE_OTHER): Payer: Medicare Other

## 2016-03-25 DIAGNOSIS — R739 Hyperglycemia, unspecified: Secondary | ICD-10-CM

## 2016-03-25 DIAGNOSIS — E7800 Pure hypercholesterolemia, unspecified: Secondary | ICD-10-CM

## 2016-03-25 DIAGNOSIS — C679 Malignant neoplasm of bladder, unspecified: Secondary | ICD-10-CM | POA: Diagnosis not present

## 2016-03-25 DIAGNOSIS — G7080 Lambert-Eaton syndrome, unspecified: Secondary | ICD-10-CM | POA: Diagnosis not present

## 2016-03-25 LAB — HEPATIC FUNCTION PANEL
ALBUMIN: 4.1 g/dL (ref 3.5–5.2)
ALT: 9 U/L (ref 0–53)
AST: 13 U/L (ref 0–37)
Alkaline Phosphatase: 56 U/L (ref 39–117)
Bilirubin, Direct: 0.1 mg/dL (ref 0.0–0.3)
TOTAL PROTEIN: 6.5 g/dL (ref 6.0–8.3)
Total Bilirubin: 0.7 mg/dL (ref 0.2–1.2)

## 2016-03-25 LAB — BASIC METABOLIC PANEL
BUN: 16 mg/dL (ref 6–23)
CALCIUM: 9.2 mg/dL (ref 8.4–10.5)
CO2: 33 meq/L — AB (ref 19–32)
CREATININE: 0.99 mg/dL (ref 0.40–1.50)
Chloride: 104 mEq/L (ref 96–112)
GFR: 75.9 mL/min (ref >60.00)
Glucose, Bld: 106 mg/dL — ABNORMAL HIGH (ref 70–99)
Potassium: 4.7 mEq/L (ref 3.5–5.1)
SODIUM: 140 meq/L (ref 135–145)

## 2016-03-25 LAB — CBC WITH DIFFERENTIAL/PLATELET
BASOS ABS: 0 10*3/uL (ref 0.0–0.1)
Basophils Relative: 0.6 % (ref 0.0–3.0)
EOS ABS: 0.1 10*3/uL (ref 0.0–0.7)
EOS PCT: 1.6 % (ref 0.0–5.0)
HCT: 41.8 % (ref 39.0–52.0)
Hemoglobin: 13.6 g/dL (ref 13.0–17.0)
LYMPHS ABS: 1.2 10*3/uL (ref 0.7–4.0)
Lymphocytes Relative: 22.6 % (ref 12.0–46.0)
MCHC: 32.5 g/dL (ref 30.0–36.0)
MCV: 89.8 fl (ref 78.0–100.0)
Monocytes Absolute: 0.4 10*3/uL (ref 0.1–1.0)
Monocytes Relative: 7.4 % (ref 3.0–12.0)
NEUTROS PCT: 67.8 % (ref 43.0–77.0)
Neutro Abs: 3.5 10*3/uL (ref 1.4–7.7)
Platelets: 169 10*3/uL (ref 150.0–400.0)
RBC: 4.66 Mil/uL (ref 4.22–5.81)
RDW: 14.7 % (ref 11.5–15.5)
WBC: 5.1 10*3/uL (ref 4.0–10.5)

## 2016-03-25 LAB — LIPID PANEL
Cholesterol: 142 mg/dL (ref 0–200)
HDL: 47.8 mg/dL (ref >39.00)
LDL Cholesterol: 79 mg/dL (ref 0–99)
NONHDL: 94.08
TRIGLYCERIDES: 75 mg/dL (ref 0.0–149.0)
Total CHOL/HDL Ratio: 3
VLDL: 15 mg/dL (ref 0.0–40.0)

## 2016-03-25 NOTE — Progress Notes (Signed)
Order placed for f/u labs.  

## 2016-03-27 ENCOUNTER — Encounter: Payer: Self-pay | Admitting: *Deleted

## 2016-03-27 DIAGNOSIS — I2510 Atherosclerotic heart disease of native coronary artery without angina pectoris: Secondary | ICD-10-CM | POA: Diagnosis not present

## 2016-03-27 DIAGNOSIS — R001 Bradycardia, unspecified: Secondary | ICD-10-CM | POA: Diagnosis not present

## 2016-04-24 ENCOUNTER — Other Ambulatory Visit (INDEPENDENT_AMBULATORY_CARE_PROVIDER_SITE_OTHER): Payer: Medicare Other

## 2016-04-24 DIAGNOSIS — R739 Hyperglycemia, unspecified: Secondary | ICD-10-CM | POA: Diagnosis not present

## 2016-04-24 LAB — HEMOGLOBIN A1C: HEMOGLOBIN A1C: 6 % (ref 4.6–6.5)

## 2016-04-25 ENCOUNTER — Encounter: Payer: Self-pay | Admitting: *Deleted

## 2016-04-26 LAB — GLUCOSE, FASTING: GLUCOSE, FASTING: 104 mg/dL — AB (ref 65–99)

## 2016-05-15 DIAGNOSIS — L812 Freckles: Secondary | ICD-10-CM | POA: Diagnosis not present

## 2016-05-15 DIAGNOSIS — L570 Actinic keratosis: Secondary | ICD-10-CM | POA: Diagnosis not present

## 2016-05-15 DIAGNOSIS — L820 Inflamed seborrheic keratosis: Secondary | ICD-10-CM | POA: Diagnosis not present

## 2016-05-15 DIAGNOSIS — D485 Neoplasm of uncertain behavior of skin: Secondary | ICD-10-CM | POA: Diagnosis not present

## 2016-05-15 DIAGNOSIS — B079 Viral wart, unspecified: Secondary | ICD-10-CM | POA: Diagnosis not present

## 2016-05-15 DIAGNOSIS — D229 Melanocytic nevi, unspecified: Secondary | ICD-10-CM | POA: Diagnosis not present

## 2016-05-15 DIAGNOSIS — Z85828 Personal history of other malignant neoplasm of skin: Secondary | ICD-10-CM | POA: Diagnosis not present

## 2016-05-15 DIAGNOSIS — D1800 Hemangioma unspecified site: Secondary | ICD-10-CM | POA: Diagnosis not present

## 2016-05-15 DIAGNOSIS — L821 Other seborrheic keratosis: Secondary | ICD-10-CM | POA: Diagnosis not present

## 2016-05-15 DIAGNOSIS — Z1283 Encounter for screening for malignant neoplasm of skin: Secondary | ICD-10-CM | POA: Diagnosis not present

## 2016-05-22 DIAGNOSIS — N3941 Urge incontinence: Secondary | ICD-10-CM | POA: Diagnosis not present

## 2016-05-22 DIAGNOSIS — G7000 Myasthenia gravis without (acute) exacerbation: Secondary | ICD-10-CM | POA: Diagnosis not present

## 2016-05-22 DIAGNOSIS — N138 Other obstructive and reflux uropathy: Secondary | ICD-10-CM | POA: Diagnosis not present

## 2016-05-22 DIAGNOSIS — C672 Malignant neoplasm of lateral wall of bladder: Secondary | ICD-10-CM | POA: Diagnosis not present

## 2016-05-22 DIAGNOSIS — N401 Enlarged prostate with lower urinary tract symptoms: Secondary | ICD-10-CM | POA: Diagnosis not present

## 2016-05-22 DIAGNOSIS — Z8551 Personal history of malignant neoplasm of bladder: Secondary | ICD-10-CM | POA: Diagnosis not present

## 2016-05-26 DIAGNOSIS — Z79899 Other long term (current) drug therapy: Secondary | ICD-10-CM | POA: Diagnosis not present

## 2016-05-26 DIAGNOSIS — Z5181 Encounter for therapeutic drug level monitoring: Secondary | ICD-10-CM | POA: Diagnosis not present

## 2016-05-26 DIAGNOSIS — G7080 Lambert-Eaton syndrome, unspecified: Secondary | ICD-10-CM | POA: Diagnosis not present

## 2016-05-26 DIAGNOSIS — G7000 Myasthenia gravis without (acute) exacerbation: Secondary | ICD-10-CM | POA: Diagnosis not present

## 2016-06-09 DIAGNOSIS — B0233 Zoster keratitis: Secondary | ICD-10-CM | POA: Diagnosis not present

## 2016-06-24 ENCOUNTER — Ambulatory Visit (INDEPENDENT_AMBULATORY_CARE_PROVIDER_SITE_OTHER): Payer: Medicare Other

## 2016-06-24 DIAGNOSIS — Z23 Encounter for immunization: Secondary | ICD-10-CM | POA: Diagnosis not present

## 2016-06-24 NOTE — Progress Notes (Signed)
Patient received flu shot 

## 2016-07-28 DIAGNOSIS — I340 Nonrheumatic mitral (valve) insufficiency: Secondary | ICD-10-CM | POA: Diagnosis not present

## 2016-07-28 DIAGNOSIS — I6523 Occlusion and stenosis of bilateral carotid arteries: Secondary | ICD-10-CM | POA: Diagnosis not present

## 2016-07-28 DIAGNOSIS — I10 Essential (primary) hypertension: Secondary | ICD-10-CM | POA: Diagnosis not present

## 2016-07-28 DIAGNOSIS — I2510 Atherosclerotic heart disease of native coronary artery without angina pectoris: Secondary | ICD-10-CM | POA: Diagnosis not present

## 2016-07-28 DIAGNOSIS — E782 Mixed hyperlipidemia: Secondary | ICD-10-CM | POA: Diagnosis not present

## 2016-07-28 DIAGNOSIS — I071 Rheumatic tricuspid insufficiency: Secondary | ICD-10-CM | POA: Diagnosis not present

## 2016-07-28 DIAGNOSIS — I5022 Chronic systolic (congestive) heart failure: Secondary | ICD-10-CM | POA: Diagnosis not present

## 2016-09-09 ENCOUNTER — Encounter: Payer: Self-pay | Admitting: Internal Medicine

## 2016-09-09 ENCOUNTER — Ambulatory Visit (INDEPENDENT_AMBULATORY_CARE_PROVIDER_SITE_OTHER): Payer: Medicare Other | Admitting: Internal Medicine

## 2016-09-09 VITALS — BP 130/80 | HR 62 | Ht 68.0 in | Wt 202.0 lb

## 2016-09-09 DIAGNOSIS — R739 Hyperglycemia, unspecified: Secondary | ICD-10-CM | POA: Diagnosis not present

## 2016-09-09 DIAGNOSIS — I2510 Atherosclerotic heart disease of native coronary artery without angina pectoris: Secondary | ICD-10-CM

## 2016-09-09 DIAGNOSIS — G7080 Lambert-Eaton syndrome, unspecified: Secondary | ICD-10-CM | POA: Diagnosis not present

## 2016-09-09 DIAGNOSIS — Z0000 Encounter for general adult medical examination without abnormal findings: Secondary | ICD-10-CM | POA: Diagnosis not present

## 2016-09-09 DIAGNOSIS — E7800 Pure hypercholesterolemia, unspecified: Secondary | ICD-10-CM

## 2016-09-09 DIAGNOSIS — C679 Malignant neoplasm of bladder, unspecified: Secondary | ICD-10-CM

## 2016-09-09 LAB — LIPID PANEL
Cholesterol: 177 mg/dL (ref 0–200)
HDL: 56.1 mg/dL (ref >39.00)
LDL Cholesterol: 98 mg/dL (ref 0–99)
NONHDL: 121.04
Total CHOL/HDL Ratio: 3
Triglycerides: 116 mg/dL (ref 0.0–149.0)
VLDL: 23.2 mg/dL (ref 0.0–40.0)

## 2016-09-09 LAB — BASIC METABOLIC PANEL
BUN: 17 mg/dL (ref 6–23)
CO2: 32 mEq/L (ref 19–32)
CREATININE: 1.02 mg/dL (ref 0.40–1.50)
Calcium: 9.7 mg/dL (ref 8.4–10.5)
Chloride: 101 mEq/L (ref 96–112)
GFR: 73.26 mL/min (ref >60.00)
Glucose, Bld: 109 mg/dL — ABNORMAL HIGH (ref 70–99)
POTASSIUM: 5.1 meq/L (ref 3.5–5.1)
Sodium: 141 mEq/L (ref 135–145)

## 2016-09-09 LAB — HEPATIC FUNCTION PANEL
ALBUMIN: 4.5 g/dL (ref 3.5–5.2)
ALK PHOS: 57 U/L (ref 39–117)
ALT: 10 U/L (ref 0–53)
AST: 13 U/L (ref 0–37)
Bilirubin, Direct: 0.2 mg/dL (ref 0.0–0.3)
Total Bilirubin: 0.8 mg/dL (ref 0.2–1.2)
Total Protein: 6.7 g/dL (ref 6.0–8.3)

## 2016-09-09 LAB — CBC WITH DIFFERENTIAL/PLATELET
BASOS ABS: 0 10*3/uL (ref 0.0–0.1)
Basophils Relative: 0.6 % (ref 0.0–3.0)
Eosinophils Absolute: 0.1 10*3/uL (ref 0.0–0.7)
Eosinophils Relative: 1.1 % (ref 0.0–5.0)
HEMATOCRIT: 43.8 % (ref 39.0–52.0)
HEMOGLOBIN: 14.3 g/dL (ref 13.0–17.0)
LYMPHS ABS: 1.2 10*3/uL (ref 0.7–4.0)
LYMPHS PCT: 22.4 % (ref 12.0–46.0)
MCHC: 32.5 g/dL (ref 30.0–36.0)
MCV: 89.7 fl (ref 78.0–100.0)
MONOS PCT: 9 % (ref 3.0–12.0)
Monocytes Absolute: 0.5 10*3/uL (ref 0.1–1.0)
NEUTROS PCT: 66.9 % (ref 43.0–77.0)
Neutro Abs: 3.5 10*3/uL (ref 1.4–7.7)
Platelets: 168 10*3/uL (ref 150.0–400.0)
RBC: 4.88 Mil/uL (ref 4.22–5.81)
RDW: 14.8 % (ref 11.5–15.5)
WBC: 5.2 10*3/uL (ref 4.0–10.5)

## 2016-09-09 LAB — TSH: TSH: 2.26 u[IU]/mL (ref 0.35–4.50)

## 2016-09-09 LAB — HEMOGLOBIN A1C: Hgb A1c MFr Bld: 5.9 % (ref 4.6–6.5)

## 2016-09-09 NOTE — Progress Notes (Signed)
Patient ID: [name], male   DOB: 02-02-1926, 82 y.o.   MRN: [id number]   Subjective:    Patient ID: [name], male    DOB: 1925/10/22, 82 y.o.   MRN: [id number]  HPI  Patient here for his physical.  He has a history of myasteni gravis/eaton lambert.  Followed by Dr Nadara Mustard.  Overall doing well.  Just evaluated by Dr Nadara Mustard 06/2016. Doing better off beta blocker.  Just saw cardiology 07/28/16.  Stable.   He tries to stay active.  No chest pain.  No sob.  No acid reflux.  No abdominal pain or cramping.  Bowels stable.  Has f/u in 10/2016 for hearing aids.  Saw eye doctor 04/2016.  Also being followed by Dr Jacqlyn Larsen for history of bladder cancer.  Felt stable and recommended f/u in 6 months.     Past Medical History:  Diagnosis Date  . Allergy   . Anemia   . Cancer ([place]) 11-24-12   bladder. BCG treatments by Dr Jacqlyn Larsen.  . Chicken pox   . Colon polyp   . 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 Essential hypertension, benign   . Hypercholesterolemia   . Myasthenia gravis ([place])   . Shingles 2013   Past Surgical History:  Procedure Laterality Date  . APPENDECTOMY  1958  . CHOLECYSTECTOMY  1979  . HERNIA REPAIR  1970  . TONSILLECTOMY  1958   Family History  Problem Relation Age of Onset  . Lung cancer Sister   . Prostate cancer Brother   . Lung cancer Brother   . Arthritis      parent  . Colon cancer Neg Hx    Social History   Social History  . Marital status: Married    Spouse name: N/A  . Number of children: N/A  . Years of education: N/A   Social History Main Topics  . Smoking status: Former Smoker    Quit date: 09/29/1958  . Smokeless tobacco: Never Used  . Alcohol use 0.0 oz/week     Comment: occasionally  . Drug use: No  . Sexual activity: No   Other Topics Concern  . None   Social History Narrative   Pt is married with 2 children - 1 son, 1 daughter. Previously self-employed in [employer]    Outpatient Encounter Prescriptions as of 09/09/2016    Medication Sig  . aspirin 81 MG tablet Take 81 mg by mouth daily.  . bimatoprost (LUMIGAN) 0.01 % SOLN Place 1 drop into the right eye every other day.   Marland Kitchen dextromethorphan (DELSYM) 30 MG/5ML liquid Take by mouth as needed for cough.  . finasteride (PROSCAR) 5 MG tablet Take 5 mg by mouth daily.  . furosemide (LASIX) 20 MG tablet Take 20 mg by mouth daily.  . isosorbide mononitrate (IMDUR) 30 MG 24 hr tablet Take 30 mg by mouth daily.  Marland Kitchen lisinopril (PRINIVIL,ZESTRIL) 5 MG tablet Take 5 mg by mouth daily.  . mycophenolate (CELLCEPT) 250 MG capsule Take 1,000 mg by mouth 2 (two) times daily.   Marland Kitchen nystatin cream (MYCOSTATIN) Apply 1 application topically 2 (two) times daily.  . pravastatin (PRAVACHOL) 20 MG tablet Take 1 tablet (20 mg total) by mouth daily.  Marland Kitchen pyridostigmine (MESTINON) 60 MG tablet Take 120 mg by mouth daily.  . tamsulosin (FLOMAX) 0.4 MG CAPS Take 0.4 mg by mouth daily.   No facility-administered encounter medications on file as of 09/09/2016.     Review of Systems  Constitutional:  Negative for appetite change and unexpected weight change.  HENT: Negative for congestion and sinus pressure.   Eyes: Negative for pain and visual disturbance.  Respiratory: Negative for cough, chest tightness and shortness of breath.   Cardiovascular: Negative for chest pain, palpitations and leg swelling.  Gastrointestinal: Negative for abdominal pain, diarrhea, nausea and vomiting.  Genitourinary: Negative for difficulty urinating and dysuria.  Musculoskeletal: Negative for back pain and joint swelling.  Skin: Negative for color change and rash.  Neurological: Negative for dizziness, light-headedness and headaches.  Hematological: Negative for adenopathy. Does not bruise/bleed easily.  Psychiatric/Behavioral: Negative for agitation and dysphoric mood.       Objective:    Physical Exam  Constitutional: He is oriented to person, place, and time. He appears well-developed and  well-nourished. No distress.  HENT:  Head: Normocephalic and atraumatic.  Nose: Nose normal.  Mouth/Throat: Oropharynx is clear and moist. No oropharyngeal exudate.  Eyes: Conjunctivae are normal. Right eye exhibits no discharge. Left eye exhibits no discharge.  Neck: Neck supple. No thyromegaly present.  Cardiovascular: Normal rate and regular rhythm.   Pulmonary/Chest: Breath sounds normal. No respiratory distress. He has no wheezes.  Abdominal: Soft. Bowel sounds are normal. There is no tenderness.  Genitourinary:  Genitourinary Comments: Followed by urology.   Musculoskeletal: He exhibits no edema or tenderness.  Lymphadenopathy:    He has no cervical adenopathy.  Neurological: He is alert and oriented to person, place, and time.  Skin: Skin is warm and dry. No rash noted. No erythema.  Psychiatric: He has a normal mood and affect. His behavior is normal.    BP 130/80   Pulse 62   Ht 5\' 8"  (1.727 m)   Wt 202 lb (91.6 kg)   SpO2 92%   BMI 30.71 kg/m  Wt Readings from Last 3 Encounters:  09/09/16 202 lb (91.6 kg)  03/10/16 205 lb (93 kg)  09/10/15 207 lb 8 oz (94.1 kg)     Lab Results  Component Value Date   WBC 5.2 09/09/2016   HGB 14.3 09/09/2016   HCT 43.8 09/09/2016   PLT 168.0 09/09/2016   GLUCOSE 109 (H) 09/09/2016   CHOL 177 09/09/2016   TRIG 116.0 09/09/2016   HDL 56.10 09/09/2016   LDLDIRECT 143.5 08/26/2013   LDLCALC 98 09/09/2016   ALT 10 09/09/2016   AST 13 09/09/2016   NA 141 09/09/2016   K 5.1 09/09/2016   CL 101 09/09/2016   CREATININE 1.02 09/09/2016   BUN 17 09/09/2016   CO2 32 09/09/2016   TSH 2.26 09/09/2016   HGBA1C 5.9 09/09/2016       Assessment & Plan:   Problem List Items Addressed This Visit    Bladder cancer ([place])    Followed by Dr Jacqlyn Larsen.  Last check stable.  Recommended f/u 6 moths.  Note reviewed.        CAD (coronary artery disease)    Sees cardiology.  Stable..  Just evaluated.  Off beta blocker.        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  Stable.  See note.       Relevant Orders   CBC with Differential/Platelet (Completed)   TSH (Completed)   Health care maintenance    Followed by Dr Jacqlyn Larsen for his prostate checks.  Physical today 09/09/16.  Colonoscopy 10/2011.        Hypercholesterolemia - Primary  On pravastatin.  Low cholesterol diet and exercise.  Follow lipid panel and liver function tests.        Relevant Orders   Hepatic function panel (Completed)   Lipid panel (Completed)    Other Visit Diagnoses    Hyperglycemia       Relevant Orders   Hemoglobin A1c (Completed)   Medicare annual wellness visit, subsequent           [name], MD

## 2016-09-09 NOTE — Patient Instructions (Addendum)
  Mr. Sheeley , Thank you for taking time to come for your Medicare Wellness Visit. I appreciate your ongoing commitment to your health goals. Please review the following plan we discussed and let me know if I can assist you in the future.   Follow up with Dr. Nicki Reaper as needed.  These are the goals we discussed: Goals    . Increase physical activity          Stay active doing yard work, cutting and hauling wood. Incorporate chair exercises as demonstrated, as tolerated.  Education provided.       This is a list of the screening recommended for you and due dates:  Health Maintenance  Topic Date Due  . Shingles Vaccine  09/09/2016*  . Tetanus Vaccine  02/27/2017*  . Pneumonia vaccines (2 of 2 - PPSV23) 10/01/2016  . Flu Shot  Completed  *Topic was postponed. The date shown is not the original due date.

## 2016-09-09 NOTE — Progress Notes (Addendum)
Subjective:   [name] is a 82 y.o. male who presents for Medicare Annual/Subsequent preventive examination.  Review of Systems:  No ROS.  Medicare Wellness Visit.  Cardiac Risk Factors include: advanced age (>35men, >70 women);male gender;hypertension     Objective:    Vitals: BP 130/80   Pulse 62   Ht 5\' 8"  (1.727 m)   Wt 202 lb (91.6 kg)   SpO2 92%   BMI 30.71 kg/m   Body mass index is 30.71 kg/m.  Tobacco History  Smoking Status  . Former Smoker  . Quit date: 09/29/1958  Smokeless Tobacco  . Never Used     Counseling given: Not Answered   Past Medical History:  Diagnosis Date  . Allergy   . Anemia   . Cancer ([place]) 11-24-12   bladder. BCG treatments by Dr Jacqlyn Larsen.  . Chicken pox   . Colon polyp   . 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 Essential hypertension, benign   . Hypercholesterolemia   . Myasthenia gravis ([place])   . Shingles 2013   Past Surgical History:  Procedure Laterality Date  . APPENDECTOMY  1958  . CHOLECYSTECTOMY  1979  . HERNIA REPAIR  1970  . TONSILLECTOMY  1958   Family History  Problem Relation Age of Onset  . Lung cancer Sister   . Prostate cancer Brother   . Lung cancer Brother   . Arthritis      parent  . Colon cancer Neg Hx    History  Sexual Activity  . Sexual activity: No    Outpatient Encounter Prescriptions as of 09/09/2016  Medication Sig  . aspirin 81 MG tablet Take 81 mg by mouth daily.  . bimatoprost (LUMIGAN) 0.01 % SOLN Place 1 drop into the right eye every other day.   Marland Kitchen dextromethorphan (DELSYM) 30 MG/5ML liquid Take by mouth as needed for cough.  . finasteride (PROSCAR) 5 MG tablet Take 5 mg by mouth daily.  . furosemide (LASIX) 20 MG tablet Take 20 mg by mouth daily.  . isosorbide mononitrate (IMDUR) 30 MG 24 hr tablet Take 30 mg by mouth daily.  Marland Kitchen lisinopril (PRINIVIL,ZESTRIL) 5 MG tablet Take 5 mg by mouth daily.  . mycophenolate (CELLCEPT) 250 MG capsule Take 1,000 mg by mouth 2 (two) times daily.     Marland Kitchen nystatin cream (MYCOSTATIN) Apply 1 application topically 2 (two) times daily.  . pravastatin (PRAVACHOL) 20 MG tablet Take 1 tablet (20 mg total) by mouth daily.  Marland Kitchen pyridostigmine (MESTINON) 60 MG tablet Take 120 mg by mouth daily.  . tamsulosin (FLOMAX) 0.4 MG CAPS Take 0.4 mg by mouth daily.   No facility-administered encounter medications on file as of 09/09/2016.     Activities of Daily Living In your present state of health, do you have any difficulty performing the following activities: 09/09/2016 09/10/2015  Hearing? Y N  Vision? N Y  Difficulty concentrating or making decisions? N N  Walking or climbing stairs? N N  Dressing or bathing? N N  Doing errands, shopping? N N  Preparing Food and eating ? N N  Using the Toilet? N N  In the past six months, have you accidently leaked urine? Y Y  Do you have problems with loss of bowel control? N N  Managing your Medications? N N  Managing your Finances? N N  Housekeeping or managing your Housekeeping? N N  Some recent data might be hidden    Patient Care Team: [name], MD as PCP -  General (Internal Medicine) [name], MD (Internal Medicine) Robert Bellow, MD (General Surgery)   Assessment:    This is a routine wellness examination for [place]. The goal of the wellness visit is to assist the patient how to close the gaps in care and create a preventative care plan for the patient.   Osteoporosis reviewed.  Medications reviewed; taking without issues or barriers.  Safety issues reviewed; lives with wife.  Smoke detectors in the home. No firearms in the home. Wears seatbelts when driving or riding with others. No violence in the home.  No identified risk were noted; The patient was oriented x 3; appropriate in dress and manner and no objective failures at ADL's or IADL's.   BMI; discussed the importance of a healthy diet, water intake and exercise. Educational material provided.  TDAP postponed  per patient request.  Educational material provided.   Health maintenance gaps; closed.  Patient Concerns: None at this time. Follow up with PCP as needed.  Exercise Activities and Dietary recommendations Current Exercise Habits: Home exercise routine (Yard work), Time (Minutes): 20, Frequency (Times/Week): 4, Weekly Exercise (Minutes/Week): 80, Intensity: Mild  Goals    . Increase physical activity          Stay active doing yard work, cutting and hauling wood. Incorporate chair exercises as demonstrated, as tolerated.  Education provided.      Fall Risk Fall Risk  09/09/2016 09/10/2015 09/10/2015 03/28/2014 03/05/2013  Falls in the past year? No No No No No   Depression Screen PHQ 2/9 Scores 09/09/2016 09/10/2015 09/10/2015 03/28/2014  PHQ - 2 Score 0 0 0 0    Cognitive Function MMSE - Mini Mental State Exam 09/09/2016 09/10/2015  Orientation to time 5 5  Orientation to Place 5 5  Registration 3 3  Attention/ Calculation 5 5  Recall 3 3  Language- name 2 objects 2 2  Language- repeat 1 1  Language- follow 3 step command 3 3  Language- read & follow direction 1 1  Write a sentence 1 1  Copy design 1 1  Total score 30 30        Immunization History  Administered Date(s) Administered  . Influenza Split 06/29/2013  . Influenza, High Dose Seasonal PF 06/24/2016  . Influenza,inj,Quad PF,36+ Mos 07/26/2014  . Influenza-Unspecified 07/06/2015  . Pneumococcal Conjugate-13 10/02/2015   Screening Tests Health Maintenance  Topic Date Due  . ZOSTAVAX  09/09/2016 (Originally 09/04/1988)  . TETANUS/TDAP  02/27/2017 (Originally 09/05/1945)  . PNA vac Low Risk Adult (2 of 2 - PPSV23) 10/01/2016  . INFLUENZA VACCINE  Completed      Plan:    End of life planning; Advance aging; Advanced directives discussed. He is currently in the process of completing HCPOA/Living Will forms.  Copy requested upon completion.  Medicare Attestation I have personally reviewed: The  patient's medical and social history Their use of alcohol, tobacco or illicit drugs Their current medications and supplements The patient's functional ability including ADLs,fall risks, home safety risks, cognitive, and hearing and visual impairment Diet and physical activities Evidence for depression   The patient's weight, height, BMI, and visual acuity have been recorded in the chart.  I have made referrals and provided education to the patient based on review of the above and I have provided the patient with a written personalized care plan for preventive services.    During the course of the visit the patient was educated and counseled about the following appropriate screening and preventive services:  Vaccines to include Pneumoccal, Influenza, Hepatitis B, Td, Zostavax, HCV  Electrocardiogram  Cardiovascular Disease  Colorectal cancer screening  Diabetes screening  Prostate Cancer Screening  Glaucoma screening  Nutrition counseling   Smoking cessation counseling  Patient Instructions (the written plan) was given to the patient.    Varney Biles, LPN  [id number]   Reviewed above information.  Agree with plan.  Dr Nicki Reaper

## 2016-09-09 NOTE — Assessment & Plan Note (Signed)
Followed by Dr Jacqlyn Larsen for his prostate checks.  Physical today 09/09/16.  Colonoscopy 10/2011.

## 2016-09-11 ENCOUNTER — Encounter: Payer: Self-pay | Admitting: Internal Medicine

## 2016-09-11 NOTE — Assessment & Plan Note (Signed)
Followed by Dr Jacqlyn Larsen.  Last check stable.  Recommended f/u 6 moths.  Note reviewed.

## 2016-09-11 NOTE — Assessment & Plan Note (Signed)
Followed by Dr Nadara Mustard.  Stable.  See note.

## 2016-09-11 NOTE — Assessment & Plan Note (Signed)
On pravastatin.  Low cholesterol diet and exercise.  Follow lipid panel and liver function tests.   

## 2016-09-11 NOTE — Assessment & Plan Note (Signed)
Sees cardiology.  Stable..  Just evaluated.  Off beta blocker.

## 2016-11-05 DIAGNOSIS — B0233 Zoster keratitis: Secondary | ICD-10-CM | POA: Diagnosis not present

## 2016-11-20 DIAGNOSIS — N3080 Other cystitis without hematuria: Secondary | ICD-10-CM | POA: Diagnosis not present

## 2016-11-20 DIAGNOSIS — Z126 Encounter for screening for malignant neoplasm of bladder: Secondary | ICD-10-CM | POA: Diagnosis not present

## 2016-11-20 DIAGNOSIS — D414 Neoplasm of uncertain behavior of bladder: Secondary | ICD-10-CM | POA: Diagnosis not present

## 2016-11-20 DIAGNOSIS — C672 Malignant neoplasm of lateral wall of bladder: Secondary | ICD-10-CM | POA: Diagnosis not present

## 2016-11-26 DIAGNOSIS — I10 Essential (primary) hypertension: Secondary | ICD-10-CM | POA: Diagnosis not present

## 2016-11-26 DIAGNOSIS — I5022 Chronic systolic (congestive) heart failure: Secondary | ICD-10-CM | POA: Diagnosis not present

## 2016-11-26 DIAGNOSIS — R0602 Shortness of breath: Secondary | ICD-10-CM | POA: Diagnosis not present

## 2016-11-26 DIAGNOSIS — I2510 Atherosclerotic heart disease of native coronary artery without angina pectoris: Secondary | ICD-10-CM | POA: Diagnosis not present

## 2016-12-18 DIAGNOSIS — L578 Other skin changes due to chronic exposure to nonionizing radiation: Secondary | ICD-10-CM | POA: Diagnosis not present

## 2016-12-18 DIAGNOSIS — L821 Other seborrheic keratosis: Secondary | ICD-10-CM | POA: Diagnosis not present

## 2016-12-18 DIAGNOSIS — L570 Actinic keratosis: Secondary | ICD-10-CM | POA: Diagnosis not present

## 2016-12-18 DIAGNOSIS — L820 Inflamed seborrheic keratosis: Secondary | ICD-10-CM | POA: Diagnosis not present

## 2016-12-22 DIAGNOSIS — I2510 Atherosclerotic heart disease of native coronary artery without angina pectoris: Secondary | ICD-10-CM | POA: Diagnosis not present

## 2016-12-22 DIAGNOSIS — I5022 Chronic systolic (congestive) heart failure: Secondary | ICD-10-CM | POA: Diagnosis not present

## 2016-12-22 DIAGNOSIS — R0602 Shortness of breath: Secondary | ICD-10-CM | POA: Diagnosis not present

## 2016-12-22 DIAGNOSIS — R42 Dizziness and giddiness: Secondary | ICD-10-CM

## 2016-12-22 DIAGNOSIS — Z79899 Other long term (current) drug therapy: Secondary | ICD-10-CM | POA: Diagnosis not present

## 2016-12-22 DIAGNOSIS — G7080 Lambert-Eaton syndrome, unspecified: Secondary | ICD-10-CM | POA: Diagnosis not present

## 2016-12-22 DIAGNOSIS — Z5181 Encounter for therapeutic drug level monitoring: Secondary | ICD-10-CM | POA: Diagnosis not present

## 2016-12-22 DIAGNOSIS — G7000 Myasthenia gravis without (acute) exacerbation: Secondary | ICD-10-CM | POA: Diagnosis not present

## 2016-12-22 HISTORY — DX: Dizziness and giddiness: R42

## 2016-12-24 ENCOUNTER — Telehealth: Payer: Self-pay | Admitting: Internal Medicine

## 2016-12-24 NOTE — Telephone Encounter (Signed)
Pt called and c/o sinus infection. He is c/o head congestion, coughing, post nasal drip. He has had this since Saturday. Can Dr. Nicki Reaper send something in for him? Please advise, thank you!  Call pt @ [phone]

## 2016-12-24 NOTE — Telephone Encounter (Signed)
With his history and already using otc medication, do feel needs to be evaluated.  I am not here this pm and have a meeting tomorrow, so unable to work in.  Office not open on Friday.  I can work him in on Monday at 12:00, but sounds like he may need to be seen prior to that.

## 2016-12-24 NOTE — Telephone Encounter (Signed)
Patient advised of below and verbalized an understanding he will go to urgent care for evaluation

## 2016-12-24 NOTE — Telephone Encounter (Signed)
  Symptoms:coughing some dry,  keep awake at night,  sinus , head congestion, no fever  Duration since Saturday  Medications:  Afrin, nasal saline ,tussin dm  Please advise Urged to go to walk in clinic

## 2016-12-27 ENCOUNTER — Emergency Department: Payer: Medicare Other

## 2016-12-27 ENCOUNTER — Inpatient Hospital Stay
Admission: EM | Admit: 2016-12-27 | Discharge: 2017-01-02 | DRG: 208 | Disposition: A | Discharge status: Home / Self Care | Payer: Medicare Other | Attending: Internal Medicine | Admitting: Internal Medicine

## 2016-12-27 ENCOUNTER — Encounter: Payer: Self-pay | Admitting: Emergency Medicine

## 2016-12-27 DIAGNOSIS — J810 Acute pulmonary edema: Secondary | ICD-10-CM

## 2016-12-27 DIAGNOSIS — I959 Hypotension, unspecified: Secondary | ICD-10-CM | POA: Insufficient documentation

## 2016-12-27 DIAGNOSIS — Z79899 Other long term (current) drug therapy: Secondary | ICD-10-CM | POA: Diagnosis not present

## 2016-12-27 DIAGNOSIS — E7800 Pure hypercholesterolemia, unspecified: Secondary | ICD-10-CM | POA: Diagnosis present

## 2016-12-27 DIAGNOSIS — J181 Lobar pneumonia, unspecified organism: Secondary | ICD-10-CM

## 2016-12-27 DIAGNOSIS — R778 Other specified abnormalities of plasma proteins: Secondary | ICD-10-CM

## 2016-12-27 DIAGNOSIS — I248 Other forms of acute ischemic heart disease: Secondary | ICD-10-CM | POA: Diagnosis present

## 2016-12-27 DIAGNOSIS — J9601 Acute respiratory failure with hypoxia: Secondary | ICD-10-CM | POA: Diagnosis not present

## 2016-12-27 DIAGNOSIS — J9690 Respiratory failure, unspecified, unspecified whether with hypoxia or hypercapnia: Secondary | ICD-10-CM | POA: Diagnosis not present

## 2016-12-27 DIAGNOSIS — Z4682 Encounter for fitting and adjustment of non-vascular catheter: Secondary | ICD-10-CM | POA: Diagnosis not present

## 2016-12-27 DIAGNOSIS — Z8551 Personal history of malignant neoplasm of bladder: Secondary | ICD-10-CM

## 2016-12-27 DIAGNOSIS — Z87891 Personal history of nicotine dependence: Secondary | ICD-10-CM | POA: Diagnosis not present

## 2016-12-27 DIAGNOSIS — Z7982 Long term (current) use of aspirin: Secondary | ICD-10-CM | POA: Diagnosis not present

## 2016-12-27 DIAGNOSIS — I472 Ventricular tachycardia: Secondary | ICD-10-CM | POA: Diagnosis present

## 2016-12-27 DIAGNOSIS — R748 Abnormal levels of other serum enzymes: Secondary | ICD-10-CM | POA: Diagnosis not present

## 2016-12-27 DIAGNOSIS — R938 Abnormal findings on diagnostic imaging of other specified body structures: Secondary | ICD-10-CM | POA: Diagnosis not present

## 2016-12-27 DIAGNOSIS — G4700 Insomnia, unspecified: Secondary | ICD-10-CM | POA: Diagnosis present

## 2016-12-27 DIAGNOSIS — I10 Essential (primary) hypertension: Secondary | ICD-10-CM | POA: Diagnosis present

## 2016-12-27 DIAGNOSIS — R42 Dizziness and giddiness: Secondary | ICD-10-CM | POA: Diagnosis not present

## 2016-12-27 DIAGNOSIS — J9801 Acute bronchospasm: Secondary | ICD-10-CM | POA: Diagnosis present

## 2016-12-27 DIAGNOSIS — G702 Congenital and developmental myasthenia: Secondary | ICD-10-CM | POA: Diagnosis not present

## 2016-12-27 DIAGNOSIS — R531 Weakness: Secondary | ICD-10-CM | POA: Diagnosis not present

## 2016-12-27 DIAGNOSIS — I712 Thoracic aortic aneurysm, without rupture, unspecified: Secondary | ICD-10-CM

## 2016-12-27 DIAGNOSIS — R7989 Other specified abnormal findings of blood chemistry: Secondary | ICD-10-CM

## 2016-12-27 DIAGNOSIS — J9621 Acute and chronic respiratory failure with hypoxia: Secondary | ICD-10-CM | POA: Diagnosis present

## 2016-12-27 DIAGNOSIS — G7080 Lambert-Eaton syndrome, unspecified: Secondary | ICD-10-CM | POA: Diagnosis present

## 2016-12-27 DIAGNOSIS — R0902 Hypoxemia: Secondary | ICD-10-CM

## 2016-12-27 DIAGNOSIS — J9691 Respiratory failure, unspecified with hypoxia: Secondary | ICD-10-CM | POA: Diagnosis not present

## 2016-12-27 DIAGNOSIS — R404 Transient alteration of awareness: Secondary | ICD-10-CM | POA: Diagnosis not present

## 2016-12-27 DIAGNOSIS — R451 Restlessness and agitation: Secondary | ICD-10-CM | POA: Diagnosis not present

## 2016-12-27 DIAGNOSIS — R410 Disorientation, unspecified: Secondary | ICD-10-CM | POA: Diagnosis present

## 2016-12-27 DIAGNOSIS — G7000 Myasthenia gravis without (acute) exacerbation: Secondary | ICD-10-CM

## 2016-12-27 DIAGNOSIS — J9600 Acute respiratory failure, unspecified whether with hypoxia or hypercapnia: Secondary | ICD-10-CM | POA: Diagnosis present

## 2016-12-27 DIAGNOSIS — R05 Cough: Secondary | ICD-10-CM | POA: Diagnosis not present

## 2016-12-27 DIAGNOSIS — R0602 Shortness of breath: Secondary | ICD-10-CM | POA: Diagnosis present

## 2016-12-27 DIAGNOSIS — H409 Unspecified glaucoma: Secondary | ICD-10-CM | POA: Diagnosis present

## 2016-12-27 DIAGNOSIS — J9811 Atelectasis: Secondary | ICD-10-CM | POA: Diagnosis present

## 2016-12-27 DIAGNOSIS — J189 Pneumonia, unspecified organism: Secondary | ICD-10-CM

## 2016-12-27 DIAGNOSIS — I2510 Atherosclerotic heart disease of native coronary artery without angina pectoris: Secondary | ICD-10-CM | POA: Diagnosis present

## 2016-12-27 DIAGNOSIS — R739 Hyperglycemia, unspecified: Secondary | ICD-10-CM | POA: Diagnosis present

## 2016-12-27 DIAGNOSIS — J90 Pleural effusion, not elsewhere classified: Secondary | ICD-10-CM | POA: Diagnosis not present

## 2016-12-27 LAB — CBC WITH DIFFERENTIAL/PLATELET
BASOS ABS: 0 10*3/uL (ref 0–0.1)
BASOS PCT: 0 %
EOS PCT: 0 %
Eosinophils Absolute: 0 10*3/uL (ref 0–0.7)
HCT: 41.5 % (ref 40.0–52.0)
Hemoglobin: 13.4 g/dL (ref 13.0–18.0)
Lymphocytes Relative: 7 %
Lymphs Abs: 0.5 10*3/uL — ABNORMAL LOW (ref 1.0–3.6)
MCH: 29.9 pg (ref 26.0–34.0)
MCHC: 32.4 g/dL (ref 32.0–36.0)
MCV: 92.4 fL (ref 80.0–100.0)
MONO ABS: 0.6 10*3/uL (ref 0.2–1.0)
Monocytes Relative: 8 %
Neutro Abs: 6.1 10*3/uL (ref 1.4–6.5)
Neutrophils Relative %: 85 %
PLATELETS: 157 10*3/uL (ref 150–440)
RBC: 4.49 MIL/uL (ref 4.40–5.90)
RDW: 14.2 % (ref 11.5–14.5)
WBC: 7.2 10*3/uL (ref 3.8–10.6)

## 2016-12-27 LAB — URINALYSIS, COMPLETE (UACMP) WITH MICROSCOPIC
BILIRUBIN URINE: NEGATIVE
Glucose, UA: NEGATIVE mg/dL
Ketones, ur: NEGATIVE mg/dL
LEUKOCYTES UA: NEGATIVE
Nitrite: NEGATIVE
PH: 5 (ref 5.0–8.0)
Protein, ur: NEGATIVE mg/dL
SPECIFIC GRAVITY, URINE: 1.016 (ref 1.005–1.030)
SQUAMOUS EPITHELIAL / LPF: NONE SEEN

## 2016-12-27 LAB — TROPONIN I
TROPONIN I: 0.56 ng/mL — AB (ref <0.03)
TROPONIN I: 0.56 ng/mL — AB (ref <0.03)
Troponin I: 0.26 ng/mL (ref <0.03)

## 2016-12-27 LAB — COMPREHENSIVE METABOLIC PANEL
ALBUMIN: 3.9 g/dL (ref 3.5–5.0)
ALK PHOS: 54 U/L (ref 38–126)
ALT: 22 U/L (ref 17–63)
ANION GAP: 6 (ref 5–15)
AST: 27 U/L (ref 15–41)
BUN: 27 mg/dL — ABNORMAL HIGH (ref 6–20)
CALCIUM: 8.7 mg/dL — AB (ref 8.9–10.3)
CO2: 32 mmol/L (ref 22–32)
Chloride: 98 mmol/L — ABNORMAL LOW (ref 101–111)
Creatinine, Ser: 1.11 mg/dL (ref 0.61–1.24)
GFR calc non Af Amer: 57 mL/min — ABNORMAL LOW (ref >60)
Glucose, Bld: 138 mg/dL — ABNORMAL HIGH (ref 65–99)
Potassium: 4.5 mmol/L (ref 3.5–5.1)
SODIUM: 136 mmol/L (ref 135–145)
TOTAL PROTEIN: 7 g/dL (ref 6.5–8.1)
Total Bilirubin: 0.8 mg/dL (ref 0.3–1.2)

## 2016-12-27 LAB — MRSA PCR SCREENING: MRSA BY PCR: NEGATIVE

## 2016-12-27 LAB — GLUCOSE, CAPILLARY
GLUCOSE-CAPILLARY: 200 mg/dL — AB (ref 65–99)
GLUCOSE-CAPILLARY: 216 mg/dL — AB (ref 65–99)

## 2016-12-27 LAB — TRIGLYCERIDES: TRIGLYCERIDES: 62 mg/dL (ref <150)

## 2016-12-27 LAB — BRAIN NATRIURETIC PEPTIDE: B NATRIURETIC PEPTIDE 5: 301 pg/mL — AB (ref 0.0–100.0)

## 2016-12-27 LAB — STREP PNEUMONIAE URINARY ANTIGEN: STREP PNEUMO URINARY ANTIGEN: NEGATIVE

## 2016-12-27 LAB — PROCALCITONIN

## 2016-12-27 LAB — LACTIC ACID, PLASMA: LACTIC ACID, VENOUS: 1.1 mmol/L (ref 0.5–1.9)

## 2016-12-27 MED ORDER — VANCOMYCIN HCL IN DEXTROSE 1-5 GM/200ML-% IV SOLN
1000.0000 mg | Freq: Once | INTRAVENOUS | Status: AC
  Administered 2016-12-27: 1000 mg via INTRAVENOUS
  Filled 2016-12-27: qty 200

## 2016-12-27 MED ORDER — PROPOFOL 1000 MG/100ML IV EMUL
5.0000 ug/kg/min | Freq: Once | INTRAVENOUS | Status: AC
  Administered 2016-12-27: 10 ug/kg/min via INTRAVENOUS

## 2016-12-27 MED ORDER — SENNOSIDES 8.8 MG/5ML PO SYRP: 5.0000 mL | ORAL_SOLUTION | Freq: Two times a day (BID) | ORAL | Status: DC | PRN

## 2016-12-27 MED ORDER — SODIUM CHLORIDE 0.9 % IV BOLUS (SEPSIS)
1000.0000 mL | Freq: Once | INTRAVENOUS | Status: AC
  Administered 2016-12-27: 1000 mL via INTRAVENOUS

## 2016-12-27 MED ORDER — FENTANYL CITRATE (PF) 100 MCG/2ML IJ SOLN
50.0000 ug | INTRAMUSCULAR | Status: DC | PRN
Start: 2016-12-27 — End: 2016-12-28
  Administered 2016-12-27: 50 ug via INTRAVENOUS

## 2016-12-27 MED ORDER — FENTANYL CITRATE (PF) 100 MCG/2ML IJ SOLN
INTRAMUSCULAR | Status: AC | PRN
  Administered 2016-12-27: 100 ug via INTRAVENOUS

## 2016-12-27 MED ORDER — NOREPINEPHRINE BITARTRATE 1 MG/ML IV SOLN
0.0000 ug/min | INTRAVENOUS | Status: DC
  Filled 2016-12-27: qty 4

## 2016-12-27 MED ORDER — DEXTROSE 5 % IV SOLN
2.0000 g | Freq: Two times a day (BID) | INTRAVENOUS | Status: DC
  Administered 2016-12-27 – 2016-12-28 (×3): 2 g via INTRAVENOUS
  Filled 2016-12-27 (×5): qty 2

## 2016-12-27 MED ORDER — PYRIDOSTIGMINE BROMIDE 60 MG PO TABS
30.0000 mg | ORAL_TABLET | Freq: Every day | ORAL | Status: DC
  Administered 2016-12-27 – 2016-12-30 (×17): 30 mg
  Filled 2016-12-27: qty 2
  Filled 2016-12-27 (×16): qty 1

## 2016-12-27 MED ORDER — DEXTROSE 5 % IV SOLN
500.0000 mg | Freq: Once | INTRAVENOUS | Status: AC
  Administered 2016-12-27: 500 mg via INTRAVENOUS
  Filled 2016-12-27: qty 500

## 2016-12-27 MED ORDER — IPRATROPIUM-ALBUTEROL 0.5-2.5 (3) MG/3ML IN SOLN
3.0000 mL | Freq: Once | RESPIRATORY_TRACT | Status: AC
  Administered 2016-12-27: 3 mL via RESPIRATORY_TRACT

## 2016-12-27 MED ORDER — FUROSEMIDE 10 MG/ML IJ SOLN
20.0000 mg | Freq: Once | INTRAMUSCULAR | Status: AC
  Administered 2016-12-27: 20 mg via INTRAVENOUS
  Filled 2016-12-27: qty 4

## 2016-12-27 MED ORDER — ALBUTEROL SULFATE (2.5 MG/3ML) 0.083% IN NEBU: 2.5000 mg | INHALATION_SOLUTION | RESPIRATORY_TRACT | Status: DC | PRN

## 2016-12-27 MED ORDER — DEXTROSE 5 % IV SOLN
500.0000 mg | INTRAVENOUS | Status: AC
  Administered 2016-12-28 – 2016-12-31 (×4): 500 mg via INTRAVENOUS
  Filled 2016-12-27 (×5): qty 500

## 2016-12-27 MED ORDER — IPRATROPIUM-ALBUTEROL 0.5-2.5 (3) MG/3ML IN SOLN
RESPIRATORY_TRACT | Status: AC
  Administered 2016-12-27: 3 mL via RESPIRATORY_TRACT
  Filled 2016-12-27: qty 3

## 2016-12-27 MED ORDER — FENTANYL CITRATE (PF) 100 MCG/2ML IJ SOLN
50.0000 ug | Freq: Once | INTRAMUSCULAR | Status: AC
  Administered 2016-12-27: 50 ug via INTRAVENOUS

## 2016-12-27 MED ORDER — BISACODYL 10 MG RE SUPP: 10.0000 mg | Freq: Every day | RECTAL | Status: DC | PRN

## 2016-12-27 MED ORDER — BUDESONIDE 0.25 MG/2ML IN SUSP: 0.2500 mg | Freq: Four times a day (QID) | RESPIRATORY_TRACT | Status: DC

## 2016-12-27 MED ORDER — ACETAMINOPHEN 325 MG PO TABS: 650.0000 mg | ORAL_TABLET | ORAL | Status: DC | PRN

## 2016-12-27 MED ORDER — VECURONIUM BROMIDE 10 MG IV SOLR
10.0000 mg | Freq: Once | INTRAVENOUS | Status: AC
  Administered 2016-12-27: 10 mg via INTRAVENOUS
  Filled 2016-12-27: qty 10

## 2016-12-27 MED ORDER — SUCCINYLCHOLINE CHLORIDE 20 MG/ML IJ SOLN
INTRAMUSCULAR | Status: AC | PRN
  Administered 2016-12-27: 120 mg via INTRAVENOUS

## 2016-12-27 MED ORDER — ASPIRIN 81 MG PO CHEW
324.0000 mg | CHEWABLE_TABLET | Freq: Once | ORAL | Status: AC
  Administered 2016-12-27: 324 mg via ORAL
  Filled 2016-12-27: qty 4

## 2016-12-27 MED ORDER — PRAVASTATIN SODIUM 20 MG PO TABS
20.0000 mg | ORAL_TABLET | Freq: Every day | ORAL | Status: DC
  Administered 2016-12-28 – 2016-12-29 (×2): 20 mg
  Filled 2016-12-27 (×2): qty 1

## 2016-12-27 MED ORDER — SODIUM CHLORIDE 0.9 % IV SOLN
INTRAVENOUS | Status: DC
  Administered 2016-12-27: 10 mL/h via INTRAVENOUS
  Administered 2016-12-30: 05:00:00 via INTRAVENOUS

## 2016-12-27 MED ORDER — DEXTROSE 5 % IV SOLN: 1.0000 g | Freq: Once | INTRAVENOUS | Status: DC

## 2016-12-27 MED ORDER — SODIUM CHLORIDE 0.9 % IV SOLN: 250.0000 mL | INTRAVENOUS | Status: DC | PRN

## 2016-12-27 MED ORDER — FENTANYL CITRATE (PF) 100 MCG/2ML IJ SOLN
50.0000 ug | INTRAMUSCULAR | Status: DC | PRN
  Administered 2016-12-27 – 2016-12-28 (×4): 50 ug via INTRAVENOUS
  Filled 2016-12-27 (×5): qty 2

## 2016-12-27 MED ORDER — BUDESONIDE 0.25 MG/2ML IN SUSP
0.2500 mg | Freq: Four times a day (QID) | RESPIRATORY_TRACT | Status: DC
  Administered 2016-12-27 – 2016-12-28 (×3): 0.25 mg via RESPIRATORY_TRACT
  Filled 2016-12-27 (×3): qty 2

## 2016-12-27 MED ORDER — VANCOMYCIN HCL IN DEXTROSE 1-5 GM/200ML-% IV SOLN
1000.0000 mg | INTRAVENOUS | Status: DC
  Administered 2016-12-27: 1000 mg via INTRAVENOUS
  Filled 2016-12-27 (×2): qty 200

## 2016-12-27 MED ORDER — ETOMIDATE 2 MG/ML IV SOLN
INTRAVENOUS | Status: AC | PRN
  Administered 2016-12-27: 10 mg via INTRAVENOUS

## 2016-12-27 MED ORDER — MIDAZOLAM HCL 2 MG/2ML IJ SOLN
4.0000 mg | Freq: Once | INTRAMUSCULAR | Status: AC
  Administered 2016-12-27: 4 mg via INTRAVENOUS

## 2016-12-27 MED ORDER — METHYLPREDNISOLONE SODIUM SUCC 125 MG IJ SOLR
125.0000 mg | Freq: Once | INTRAMUSCULAR | Status: AC
  Administered 2016-12-27: 125 mg via INTRAVENOUS
  Filled 2016-12-27: qty 2

## 2016-12-27 MED ORDER — ENOXAPARIN SODIUM 40 MG/0.4ML ~~LOC~~ SOLN: 40.0000 mg | SUBCUTANEOUS | Status: DC

## 2016-12-27 MED ORDER — CEFTRIAXONE SODIUM-DEXTROSE 1-3.74 GM-% IV SOLR
1.0000 g | Freq: Once | INTRAVENOUS | Status: AC
  Administered 2016-12-27: 1 g via INTRAVENOUS
  Filled 2016-12-27: qty 50

## 2016-12-27 MED ORDER — MIDAZOLAM HCL 5 MG/5ML IJ SOLN
INTRAMUSCULAR | Status: AC | PRN
  Administered 2016-12-27: 2 mg via INTRAVENOUS

## 2016-12-27 MED ORDER — LATANOPROST 0.005 % OP SOLN
1.0000 [drp] | Freq: Every day | OPHTHALMIC | Status: DC
Start: 2016-12-27 — End: 2017-01-02
  Administered 2016-12-27 – 2017-01-01 (×6): 1 [drp] via OPHTHALMIC
  Filled 2016-12-27: qty 2.5

## 2016-12-27 MED ORDER — NOREPINEPHRINE BITARTRATE 1 MG/ML IV SOLN
0.0000 ug/min | INTRAVENOUS | Status: DC
  Administered 2016-12-27: 5 ug/min via INTRAVENOUS
  Filled 2016-12-27: qty 16

## 2016-12-27 MED ORDER — IOPAMIDOL (ISOVUE-300) INJECTION 61%
75.0000 mL | Freq: Once | INTRAVENOUS | Status: DC | PRN
Start: 2016-12-27 — End: 2016-12-27

## 2016-12-27 MED ORDER — PROPOFOL 1000 MG/100ML IV EMUL
0.0000 ug/kg/min | INTRAVENOUS | Status: DC
  Administered 2016-12-27: 5 ug/kg/min via INTRAVENOUS
  Administered 2016-12-28: 30 ug/kg/min via INTRAVENOUS
  Administered 2016-12-28: 20 ug/kg/min via INTRAVENOUS
  Filled 2016-12-27 (×3): qty 100

## 2016-12-27 MED ORDER — PANTOPRAZOLE SODIUM 40 MG IV SOLR
40.0000 mg | Freq: Every day | INTRAVENOUS | Status: DC
  Administered 2016-12-27: 40 mg via INTRAVENOUS
  Filled 2016-12-27: qty 40

## 2016-12-27 MED ORDER — IPRATROPIUM-ALBUTEROL 0.5-2.5 (3) MG/3ML IN SOLN
3.0000 mL | Freq: Four times a day (QID) | RESPIRATORY_TRACT | Status: DC
  Administered 2016-12-27 – 2016-12-28 (×3): 3 mL via RESPIRATORY_TRACT
  Filled 2016-12-27 (×3): qty 3

## 2016-12-27 MED ORDER — IOPAMIDOL (ISOVUE-370) INJECTION 76%
75.0000 mL | Freq: Once | INTRAVENOUS | Status: AC | PRN
Start: 2016-12-27 — End: 2016-12-27
  Administered 2016-12-27: 75 mL via INTRAVENOUS

## 2016-12-27 MED ORDER — ONDANSETRON HCL 4 MG/2ML IJ SOLN: 4.0000 mg | Freq: Four times a day (QID) | INTRAMUSCULAR | Status: DC | PRN

## 2016-12-27 MED ORDER — ORAL CARE MOUTH RINSE
15.0000 mL | Freq: Four times a day (QID) | OROMUCOSAL | Status: DC
  Administered 2016-12-27 – 2016-12-29 (×5): 15 mL via OROMUCOSAL

## 2016-12-27 MED ORDER — PROPOFOL 1000 MG/100ML IV EMUL
INTRAVENOUS | Status: AC
  Administered 2016-12-27: 10 ug/kg/min via INTRAVENOUS
  Filled 2016-12-27: qty 100

## 2016-12-27 MED ORDER — CHLORHEXIDINE GLUCONATE 0.12% ORAL RINSE (MEDLINE KIT)
15.0000 mL | Freq: Two times a day (BID) | OROMUCOSAL | Status: DC
  Administered 2016-12-27 – 2017-01-01 (×7): 15 mL via OROMUCOSAL

## 2016-12-27 MED ORDER — INSULIN ASPART 100 UNIT/ML ~~LOC~~ SOLN
0.0000 [IU] | SUBCUTANEOUS | Status: DC
  Administered 2016-12-27: 3 [IU] via SUBCUTANEOUS
  Administered 2016-12-27: 5 [IU] via SUBCUTANEOUS
  Administered 2016-12-28: 3 [IU] via SUBCUTANEOUS
  Administered 2016-12-28: 2 [IU] via SUBCUTANEOUS
  Administered 2016-12-28: 3 [IU] via SUBCUTANEOUS
  Administered 2016-12-29: 2 [IU] via SUBCUTANEOUS
  Administered 2016-12-29: 5 [IU] via SUBCUTANEOUS
  Administered 2016-12-30 (×3): 3 [IU] via SUBCUTANEOUS
  Administered 2016-12-30: 2 [IU] via SUBCUTANEOUS
  Administered 2016-12-30 – 2016-12-31 (×2): 3 [IU] via SUBCUTANEOUS
  Administered 2016-12-31: 2 [IU] via SUBCUTANEOUS
  Administered 2016-12-31: 5 [IU] via SUBCUTANEOUS
  Administered 2016-12-31: 2 [IU] via SUBCUTANEOUS
  Administered 2017-01-01 (×2): 3 [IU] via SUBCUTANEOUS
  Administered 2017-01-01: 2 [IU] via SUBCUTANEOUS
  Filled 2016-12-27 (×2): qty 2
  Filled 2016-12-27: qty 3
  Filled 2016-12-27: qty 5
  Filled 2016-12-27 (×3): qty 3
  Filled 2016-12-27: qty 5
  Filled 2016-12-27: qty 2
  Filled 2016-12-27: qty 3
  Filled 2016-12-27: qty 2
  Filled 2016-12-27: qty 3
  Filled 2016-12-27: qty 5
  Filled 2016-12-27: qty 3
  Filled 2016-12-27 (×2): qty 2
  Filled 2016-12-27 (×2): qty 3
  Filled 2016-12-27: qty 2

## 2016-12-27 MED ORDER — MYCOPHENOLATE 200 MG/ML ORAL SUSPENSION
1000.0000 mg | Freq: Two times a day (BID) | ORAL | Status: DC
  Administered 2016-12-27 – 2016-12-28 (×3): 1000 mg
  Filled 2016-12-27 (×5): qty 20

## 2016-12-27 NOTE — ED Notes (Signed)
All of pt belongings have been given to family to go home. 2 bags of clothes, pt billfold and watch.

## 2016-12-27 NOTE — Procedures (Signed)
PROCEDURE NOTE: CVL PLACEMENT  INDICATION:    Monitoring of central venous pressures and/or administration of medications optimally administered in central vein  CONSENT:   Risks of procedure as well as the alternatives were explained to the patient or surrogate. Consent for procedure obtained. A time out was performed.   PROCEDURE  Sterile technique was used including antiseptics, cap, gloves, gown, hand hygiene, mask and full body sheet.  Skin prep: Chlorhexidine; local anesthetic administered  A triple lumen catheter was placed in the L [place] vein using the Seldinger technique.   EVALUATION:  Blood flow good  Complications: No apparent complications  Patient tolerated the procedure well.  Chest X-ray ordered to verify placement and is pending   Merton Border, MD PCCM service Mobile [phone] 12/27/2016

## 2016-12-27 NOTE — Code Documentation (Signed)
Per Dr. Reita Cliche do not start Levophed, give full 1 liter normal saline instead of 500 cc that was previously ordered. CCM doc coming down to evaluate pt.

## 2016-12-27 NOTE — ED Triage Notes (Signed)
Arrives with one week history of congested, non productive cough, shortness of breath, and intermittent periods of confusion.

## 2016-12-27 NOTE — ED Notes (Signed)
Pt returned from CT °

## 2016-12-27 NOTE — ED Notes (Signed)
Dr. Alva Garnet at bedside inserting central line

## 2016-12-27 NOTE — ED Notes (Signed)
Pt wife states that pt has been more confused since last night. Pt has been sick with cough, congestion, and shortness of breath for the past 2 weeks. Pt reports that 3 nights this week he slept in the recliner due to shortness of breath.  Pt A & O x3 at this time, color WNL, Pt currently on 4 liter via [place]

## 2016-12-27 NOTE — Progress Notes (Signed)
Pharmacy Antibiotic Note  [name] is a 83 y.o. male admitted on 12/27/2016 with sepsis.  Pharmacy has been consulted for vancomycin dosing.  Plan: Vancomycin 1000mg  IV x1 given. Will order vancomycin 1000mg  IV Q18hrs. Goal trough 15-69mcg/mL. Will order trough prior to the fifth dose.   Stacked 6hrs  Height: 5\' 10"  (177.8 cm) Weight: 209 lb 10.5 oz (95.1 kg) IBW/kg (Calculated) : 73  Temp (24hrs), Avg:98 F (36.7 C), Min:98 F (36.7 C), Max:98 F (36.7 C)   Recent Labs Lab 12/27/16 1053  WBC 7.2  CREATININE 1.11  LATICACIDVEN 1.1    Estimated Creatinine Clearance: 53.2 mL/min (by C-G formula based on SCr of 1.11 mg/dL).    Allergies  Allergen Reactions  . 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 Ciprofloxacin Other (See Comments)    Last resort due to [place].   . Sulfa Antibiotics Rash    Antimicrobials this admission: Azithromycin 3/31 >> Ceftriaxone 3/31 >> 3/31 Cefepime 3/31 >> Vancomycin 3/31 >>  Microbiology results: 3/31 BCx: sent  3/31 Sputum: sent   Thank you for allowing pharmacy to be a part of this patient's care.  Loree Fee, PharmD 12/27/2016 3:31 PM

## 2016-12-27 NOTE — ED Provider Notes (Addendum)
[facility] Emergency Department Provider Note ____________________________________________   I have reviewed the triage vital signs and the triage nursing note.  HISTORY  Chief Complaint Shortness of Breath and Cough   Historian Patient, wife and sister-in-law  HPI RIPKEN REKOWSKI is a 83 y.o. male with a history of myasthenia gravis, coronary artery disease, presents with several days of cough, and then confused this morning. Denies fevers, nausea, vomiting, or diarrhea. No abdominal pain. No headache. No focal weakness or numbness. Symptoms moderate to severe. Wife and sister-in-law were concerned that he be evaluated today. He is barely able walk without dyspnea. No history of COPD or asthma or congestive heart failure.  In review of epic notes, lasix listed as a home med.   Past Medical History:  Diagnosis Date  . Allergy   . Anemia   . Cancer ([place]) 11-24-12   bladder. BCG treatments by Dr Jacqlyn Larsen.  . Chicken pox   . Colon polyp   . 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 Essential hypertension, benign   . Hypercholesterolemia   . Myasthenia gravis ([place])   . Shingles 2013    Patient Active Problem List   Diagnosis Date Noted  . Leg weakness 03/11/2016  . Skin lesion of cheek 09/11/2015  . Rash 03/10/2015  . Health care maintenance 03/10/2015  . Sinusitis 08/30/2014  . CAD (coronary artery disease) 06/18/2014  . SOB (shortness of breath) 03/10/2014  . Abnormal EKG 03/10/2014  . Benign lipomatous neoplasm of skin and subcutaneous tissue of left arm 10/04/2013  . Arm skin lesion, left 09/04/2013  . History of shingles 09/04/2013  . Bladder cancer ([place]) 03/05/2013  . Anemia 08/30/2012  . 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 Hypercholesterolemia 08/30/2012    Past Surgical History:  Procedure Laterality Date  . APPENDECTOMY  1958  . CHOLECYSTECTOMY  1979  . HERNIA REPAIR  1970  . TONSILLECTOMY  1958    Prior to Admission  medications   Medication Sig Start Date End Date Taking? Authorizing Provider  aspirin 81 MG tablet Take 81 mg by mouth daily.   Yes Historical Provider, MD  bimatoprost (LUMIGAN) 0.01 % SOLN Place 1 drop into the right eye every other day.    Yes Historical Provider, MD  dextromethorphan (DELSYM) 30 MG/5ML liquid Take by mouth as needed for cough.   Yes Historical Provider, MD  finasteride (PROSCAR) 5 MG tablet Take 5 mg by mouth daily.   Yes Historical Provider, MD  furosemide (LASIX) 20 MG tablet Take 20 mg by mouth daily.   Yes Corey Skains, MD  isosorbide mononitrate (IMDUR) 30 MG 24 hr tablet Take 30 mg by mouth daily.   Yes Corey Skains, MD  lisinopril (PRINIVIL,ZESTRIL) 5 MG tablet Take 5 mg by mouth daily.   Yes Historical Provider, MD  mycophenolate (CELLCEPT) 250 MG capsule Take 1,000 mg by mouth 2 (two) times daily.    Yes Historical Provider, MD  nystatin cream (MYCOSTATIN) Apply 1 application topically 2 (two) times daily. 09/14/15  Yes [name], MD  pravastatin (PRAVACHOL) 20 MG tablet Take 1 tablet (20 mg total) by mouth daily. 07/27/14  Yes [name], MD  pyridostigmine (MESTINON) 60 MG tablet Take 120 mg by mouth daily.   Yes Historical Provider, MD  tamsulosin (FLOMAX) 0.4 MG CAPS Take 0.4 mg by mouth daily.   Yes Historical Provider, MD    Allergies  Allergen Reactions  . Other Other (See Comments)    Calcium Channel  Blocker - pre-synaptic blockade will precipitate acute weakness in Lambert-Eaton Syndrome Beta Blocker - pre- and post- synaptic blockade will precipitate acute weakness in Lambert-Eaton Syndrome and Myasthenia Gravis SEE [email or web address]  . Ciprofloxacin Other (See Comments)    Last resort due to [place].   . Sulfa Antibiotics Rash    Family History  Problem Relation Age of Onset  . Lung cancer Sister   . Prostate cancer Brother   . Lung cancer Brother   . Arthritis      parent  . Colon cancer Neg Hx     Social  History Social History  Substance Use Topics  . Smoking status: Former Smoker    Quit date: 09/29/1958  . Smokeless tobacco: Never Used  . Alcohol use 0.0 oz/week     Comment: occasionally    Review of Systems  Constitutional: Negative for fever. Eyes: Negative for visual changes. ENT: Negative for sore throat. Cardiovascular: Negative for chest pain. Respiratory: Positive for shortness of breath. Gastrointestinal: Negative for abdominal pain, vomiting and diarrhea. Genitourinary: Negative for dysuria. Musculoskeletal: Negative for back pain. Skin: Negative for rash. Neurological: Negative for headache. 10 point Review of Systems otherwise negative ____________________________________________   PHYSICAL EXAM:  VITAL SIGNS: ED Triage Vitals  Enc Vitals Group     BP 12/27/16 1043 (!) 140/3     Pulse Rate 12/27/16 1043 81     Resp 12/27/16 1043 (!) 28     Temp 12/27/16 1043 98 F (36.7 C)     Temp Source 12/27/16 1043 Oral     SpO2 12/27/16 1043 (!) 40 %     Weight 12/27/16 1046 205 lb (93 kg)     Height 12/27/16 1046 5\' 10"  (1.778 m)     Head Circumference --      Peak Flow --      Pain Score --      Pain Loc --      Pain Edu? --      Excl. in [place]? --      Constitutional: Alert and cooperative, somewhat poor historian. Well appearing overall and in no distress. HEENT   Head: Normocephalic and atraumatic.      Eyes: Conjunctivae are normal. PERRL. Normal extraocular movements.      Ears:         Nose: No congestion/rhinnorhea.   Mouth/Throat: Mucous membranes are moist.   Neck: No stridor. Cardiovascular/Chest: Normal rate, regular rhythm.  No murmurs, rubs, or gallops. Respiratory: Normal respiratory effort without tachypnea nor retractions. Breath sounds are clear and equal bilaterally. Some decreased air movement.  Severe ronchi, especially left posterior.  Mild wheezing. Gastrointestinal: Soft. No distention, no guarding, no rebound. Nontender.    Genitourinary/rectal:Deferred Musculoskeletal: Nontender with normal range of motion in all extremities. No joint effusions.  No lower extremity tenderness.  No edema. Neurologic: No facial droop. Normal speech and language. No gross or focal neurologic deficits are appreciated. Skin:  Skin is warm, dry and intact. No rash noted. Psychiatric: Mood and affect are normal. Speech and behavior are normal. Patient exhibits appropriate insight and judgment.   ____________________________________________  LABS (pertinent positives/negatives)  Labs Reviewed  COMPREHENSIVE METABOLIC PANEL - Abnormal; Notable for the following:       Result Value   Chloride 98 (*)    Glucose, Bld 138 (*)    BUN 27 (*)    Calcium 8.7 (*)    GFR calc non Af Amer 57 (*)    All other components within normal  limits  BRAIN NATRIURETIC PEPTIDE - Abnormal; Notable for the following:    B Natriuretic Peptide 301.0 (*)    All other components within normal limits  TROPONIN I - Abnormal; Notable for the following:    Troponin I 0.56 (*)    All other components within normal limits  CBC WITH DIFFERENTIAL/PLATELET - Abnormal; Notable for the following:    Lymphs Abs 0.5 (*)    All other components within normal limits  CULTURE, BLOOD (ROUTINE X 2)  CULTURE, BLOOD (ROUTINE X 2)  LACTIC ACID, PLASMA  LACTIC ACID, PLASMA  URINALYSIS, COMPLETE (UACMP) WITH MICROSCOPIC    ____________________________________________    EKG I, Lisa Roca, MD, the attending physician have personally viewed and interpreted all ECGs.  80 bpm. normal sinus rhythm. Normal axis. Nonspecific ST and T-wave ____________________________________________  RADIOLOGY All Xrays were viewed by me. Imaging interpreted by Radiologist.  CXR:  FINDINGS: Stable borderline enlarged cardiac silhouette. Aortic arch calcifications. Mildly elevated left hemidiaphragm. Resolved bibasilar atelectasis with interval minimal linear density laterally.  Minimally prominent interstitial markings. Diffuse osteopenia.  IMPRESSION: 1. Minimal interstitial pneumonitis or edema. 2. Minimal left basilar linear atelectasis or scarring. 3. Aortic atherosclerosis.  CT chest with contrast for pe:  IMPRESSION: No evidence of pulmonary embolism.  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  Coronary arterial calcification.  Scattered atelectasis in both lungs with minimal interstitial prominence which could represent minimal edema or infiltrate.  Recommend semi-annual imaging of the thoracic aorta followup by chest CTA or MRA and referral to cardiothoracic surgery if not already obtained. This recommendation follows 2010 ACCF/AHA/AATS/ACR/ASA/SCA/SCAI/SIR/STS/SVM Guidelines for the Diagnosis and Management of Patients With Thoracic Aortic Disease. Circulation. 2010; 121: e266-e36 __________________________________________  PROCEDURES  Procedure(s) performed: INTUBATION Performed by: Lisa Roca  Required items: required blood products, implants, devices, and special equipment available Patient identity confirmed: provided demographic data and hospital-assigned identification number Time out: Immediately prior to procedure a "time out" was called to verify the correct patient, procedure, equipment, support staff and site/side marked as required.  Indications: hypoxic respiratory failure  Intubation method: Glidescope Laryngoscopy   Preoxygenation: BVM  Sedatives: Etomidate Paralytic: Succinylcholine  Tube Size: 8.0 cuffed  Post-procedure assessment: chest rise and ETCO2 monitor Breath sounds: equal and absent over the epigastrium Tube secured with: ETT holder Chest x-ray interpreted by radiologist and me.  Chest x-ray findings: endotracheal tube in appropriate position  Patient tolerated the procedure well with no immediate  complications.     Critical Care performed: CRITICAL CARE Performed by: Lisa Roca   Total critical care time: 60 minutes  Critical care time was exclusive of separately billable procedures and treating other patients.  Critical care was necessary to treat or prevent imminent or life-threatening deterioration.  Critical care was time spent personally by me on the following activities: development of treatment plan with patient and/or surrogate as well as nursing, discussions with consultants, evaluation of patient's response to treatment, examination of patient, obtaining history from patient or surrogate, ordering and performing treatments and interventions, ordering and review of laboratory studies, ordering and review of radiographic studies, pulse oximetry and re-evaluation of patient's condition.   ____________________________________________   ED COURSE / ASSESSMENT AND PLAN  Pertinent labs & imaging results that were available during my care of the patient were reviewed by me and considered in my medical decision making (see chart for details).   Patient arrived private vehicle, we will do in by wheelchair and room air  O2 sat was 40%. Patient was placed on 4 L nasal cannula O2 sat up to 87%, on 5 L nasal cannula O2 sat is 90%.  Somewhat difficult historians, but it sounds like he is denying a history of known asthma, COPD, or CHF.  On his medication list, Lasix was listed as a home medication.  On exam, significant rhonchi asymmetrically on the left. Patient's spitting planing about a cough and does most concerning for clinical pneumonia.  Chest x-ray with left lower lobe infiltrate, read by radiologist as atelectasis versus scarring and some interstitial edema versus pneumonitis. Clinically with the coughing and I am going to cover him for community acquired pneumonia with Rocephin and azithromycin. Alternatively, he has not had a fever and his white cell count is  normal. Given the interstitial edema and the fact he is on history of Lasix in the past, I am going to treat with a dose of Lasix.  History troponin came back elevated 0.5. I'm giving him aspirin. I am most suspicious of ischemic strain especially in setting of significant hypoxia.  Patient became more wheezy, and was given DuoNeb treatment here in the emergency department as well as Solu-Medrol for bronchospasm contribution.   CONSULTATIONS:   Hospitalist, Dr. Judeen Hammans for admission.   Patient / Family / Caregiver informed of clinical course, medical decision-making process, and agree with plan.   Addended: I was called into patients room by nurse for good wave form, 02 sat 80, down to 70 on 4l [place].  No respiratory distress.  No falling asleep.  Not exerting himself.  O2 up to 5l [place] and patient breathed deeply and o2 sat back to 96%.  I am giving the lasix now.  I am also adding ct chest for further differentiation and ro pe.  CT without pe, no lobar pneumonia, possible edema vs. Interstitial pneumonia.  I was called to bedside for patient with acutely altered mental status, trouble following commands, and O2 sat dropped to 70s on 5 L nasal cannula. Patient moved to a nonrebreather, O2 sat back up to 90s, but no recovery of mental status.  Initially patient did not present with chronic ongoing confusion, I was not concerned initially for hypercarbia. At this point, given patient's rapid decline, and it is not quite back to baseline after improvement hypoxia, he does not really appear to be breathing adequately, tachypnea and small breaths, I discussed with family about intubation.  I am still clinically concerned about chf vs. Pneumonia.  I don't think bipap will ultimately prevent intubation.  Chose to proceed with family.   Intubated without complication.  O2 sat 96% on 40%.    Started to have trouble with low bp after propofol sedation started.  Propofol was stopped, blood pressure  remained 73-22 systolic.  Given 500 cc bolus. Discussed with intensivist Dr. Leonidas Romberg, who came to the bedside. Patient getting fentanyl and Versed intermittently for sedation. Intensivist recommended vecuronium.  Intensivist planning for central line placement.  ___________________________________________   FINAL CLINICAL IMPRESSION(S) / ED DIAGNOSES   Final diagnoses:  Hypoxia  Cough  Troponin I above reference range  Community acquired pneumonia of left lower lobe of lung ([place])  Acute pulmonary edema (HCC)  Bronchospasm, acute              Note: This dictation was prepared with Dragon dictation. Any transcriptional errors that result from this process are unintentional    Lisa Roca, MD 12/27/16 [place], MD 12/27/16 1501

## 2016-12-27 NOTE — H&P (Signed)
PULMONARY / CRITICAL CARE MEDICINE   Name: [name] MRN: [id number] DOB: 14-Sep-1926    ADMISSION DATE:  12/27/2016   PT PROFILE: 81M with decades long history of myasthenia gravis, well controlled for many years admitted via ED to ICU/PCCM service with 5 days of nasal/sinus congestion and 1-2 days of abnormal behavior and progressive dyspnea. In ED was episodically severe hypoxemia. Also, hypotension.   MAJOR EVENTS/TEST RESULTS: 03/31 Admission as above 03/31 CT chest: No PE. Mild bilateral atelectasis. Mild interstitial prominence c/w pneumonitis vs edema  INDWELLING DEVICES:: ETT 03/31 >>  L [place] CVL 03/31 >>   MICRO DATA: MRSA PCR 03/31 >>  Urine 03/31 >>  Resp 03/31 >>  Blood 03/31  >>  Strep AG 03/31 >>  Legionella Ag 03/31 >>   PCT algorithm 03/31-04/02:    ANTIMICROBIALS:  Vanc 03/31  >>  Azithromycin 03/31 >> Cefepime 03/31 >>    HISTORY OF PRESENT ILLNESS:   As above. History provided by daughter. She indicates that he is highly functional @ baseline. He is followed by Neurology @ [facility] for myasthenia gravis (and Eaton-Lambert?!). He is maintained on pyridostigmine and mycophenolate. He was last seen on 03/26 and was in [place]. He is also followed by [facility] for CAD - last seen 03/26 and again noted to be in [place].   5 days PTA he developed nasal and sinus congestion. Over 1-2 days prior to admission he developed mild, intermittent alteration in cognition and progressive dyspnea. He was brought to ED by family and was found to have episodes of profound hypoxemia (SpO2 as low as mid 40% initially). He ultimately required intubation for hypoxemia and dyspnea. He received a dose of furosemide, then subsequently developed hypotension and received NS bolus. At the time of my evaluation, he was somewhat agitated and biting on ETT. He was not able to follow commands and was dyssynchronous with vent requiring sedation and a dose of vecuronium.   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  PAST SURGICAL HISTORY: He  has a past surgical history that includes Appendectomy (1958); Cholecystectomy (1979); Tonsillectomy (1958); and Hernia repair (1970).  Allergies  Allergen Reactions  . 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 Ciprofloxacin Other (See Comments)    Last resort due to [place].   . Sulfa Antibiotics Rash    No current facility-administered medications on file prior to encounter.    Current Outpatient Prescriptions on File Prior to Encounter  Medication Sig  . aspirin 81 MG tablet Take 81 mg by mouth daily.  . bimatoprost (LUMIGAN) 0.01 % SOLN Place 1 drop into the right eye every other day.   Marland Kitchen dextromethorphan (DELSYM) 30 MG/5ML liquid Take by mouth as needed for cough.  . finasteride (PROSCAR) 5 MG tablet Take 5 mg by mouth daily.  . furosemide (LASIX) 20 MG tablet Take 20 mg by mouth daily.  . isosorbide mononitrate (IMDUR) 30 MG 24 hr tablet Take 30 mg by mouth daily.  Marland Kitchen lisinopril (PRINIVIL,ZESTRIL) 5 MG tablet Take 5 mg by mouth daily.  . mycophenolate (CELLCEPT) 250 MG capsule Take 1,000 mg by mouth 2 (  two) times daily.   Marland Kitchen nystatin cream (MYCOSTATIN) Apply 1 application topically 2 (two) times daily.  . pravastatin (PRAVACHOL) 20 MG tablet Take 1 tablet (20 mg total) by mouth daily.  Marland Kitchen pyridostigmine (MESTINON) 60 MG tablet Take 30 mg by mouth 6 (six) times daily.   . tamsulosin (FLOMAX) 0.4 MG CAPS Take 0.4 mg by mouth daily.    FAMILY HISTORY:  His @FAMSTP (<SUBSCRIPT>  error)@  SOCIAL HISTORY: He  reports that he quit smoking about 58 years ago. He has never used smokeless tobacco. He reports that he drinks alcohol. He reports that he does not use drugs.  REVIEW OF SYSTEMS:   Level 5 caveat  SUBJECTIVE:    VITAL SIGNS: BP 131/72   Pulse (!) 59   Temp 98 F (36.7 C) (Oral)   Resp 20   Ht 5\' 10"  (1.778 m)   Wt 209 lb 10.5 oz (95.1 kg)   SpO2 100%   BMI 30.08 kg/m   HEMODYNAMICS:    VENTILATOR SETTINGS: Vent Mode: AC FiO2 (%):  [40 %] 40 % Set Rate:  [16 bmp] 16 bmp Vt Set:  [500 mL] 500 mL PEEP:  [5 cmH20] 5 cmH20  INTAKE / OUTPUT: No intake/output data recorded.  PHYSICAL EXAMINATION: General: WDWN, appears younger than true age, intubated and sedated Neuro: CNs intact, MAEs with normal appearing strength, DTRs symmetric HEENT: NCAT, sclerae white Cardiovascular: regular, no M noted Lungs: diffuse rhonchi, wheezes Abdomen: soft, mildly distended, + BS Ext: normal muscle bulk, no edema, warm Skin: no lesions noted  LABS:  BMET  Recent Labs Lab 12/27/16 1053  NA 136  K 4.5  CL 98*  CO2 32  BUN 27*  CREATININE 1.11  GLUCOSE 138*    Electrolytes  Recent Labs Lab 12/27/16 1053  CALCIUM 8.7*    CBC  Recent Labs Lab 12/27/16 1053  WBC 7.2  HGB 13.4  HCT 41.5  PLT 157    Coag's No results for input(s): APTT, INR in the last 168 hours.  Sepsis Markers  Recent Labs Lab 12/27/16 1053  LATICACIDVEN 1.1    ABG  Recent Labs Lab 12/27/16 1340  PHART 7.31*  PCO2ART 60*  PO2ART 88    Liver Enzymes  Recent Labs Lab 12/27/16 1053  AST 27  ALT 22  ALKPHOS 54  BILITOT 0.8  ALBUMIN 3.9    Cardiac Enzymes  Recent Labs Lab 12/27/16 1053  TROPONINI 0.56*    Glucose No results for input(s): GLUCAP in the last 168 hours.  CXR: diffuse interstitial prominence, LLL ATX/effusion/infiltrate, prominent pulmonary arteries   ASSESSMENT / PLAN:  PULMONARY A: Acute hypoxic respiratory  failure Former (remote) smoker - no prior history of COPD Wheezing Pulmonary infiltrates - pneumonitis vs edema LLL Atx Hypoxemia out of proportion to radiographic findings P:   Vent settings established Vent bundle implemented Daily SBT as indicated Scheduled nebulized steroids/bronchodilators  CARDIOVASCULAR A:  H/O CAD Hypotension - unclear etiology P:  Norepinephrine to maintain MAP goal > 65 mmHg Cycle cardiac markers Echocardiogram with bubble study  RENAL A:   No acute issues P:   Monitor BMET intermittently Monitor I/Os Correct electrolytes as indicated  GASTROINTESTINAL A:   No issues P:   SUP: IV pantoprazole Consider TFs 04/01  HEMATOLOGIC A:   No acute issues P:  DVT px: enoxaparin Monitor CBC intermittently Transfuse per usual guidelines   INFECTIOUS A:   Possible PNA, NOS Immunocompromised ([place]) P:   Monitor temp, WBC count Micro and abx  as above  ENDOCRINE A:   Mild hyperglycemia without history of DM P:   Monitor glu on chem panels Consider SSI for glu > 180  NEUROLOGIC A:   Myasthenia Acute delirium ICU/associated discomfort P:   RASS goal: -2, -3 PAD protocol - propofol, PRN fentnayl   FAMILY  - Updates: Daughter updated @ bedside and care plan discussed   CCM time: 45 mins The above time includes time spent in consultation with patient and/or family members and reviewing care plan on multidisciplinary rounds  Merton Border, MD PCCM service Mobile [phone] Pager [phone] 12/27/2016, 3:52 PM

## 2016-12-27 NOTE — ED Notes (Addendum)
During breathing treatment pt with involuntary muscle twitching of the lower extremities. Pt having trouble holding nebulizer and Pt had episode of unresponsiveness, Dr. Reita Cliche paged to bedside.  Pt O2 sats dropped to upper 80s.  Dr. Reita Cliche at bedside preparing to intube Pt placed on zoll pads

## 2016-12-27 NOTE — Code Documentation (Signed)
Dr. Reita Cliche aware of blood pressure, verbal order to start Levophed

## 2016-12-27 NOTE — ED Notes (Signed)
RN in room with pt, pt o2 sat dropped to 76% on 4 liters with good wave form, RN instructed pt to take deep breaths, pts O2 sats increased to 85%, Pt O2 increased to 5 liters. Dr. Reita Cliche paged to bedside. Pt O2 sat improved to 95% on 5 liters.

## 2016-12-27 NOTE — Code Documentation (Signed)
Verbal order given for 500 ml bolus

## 2016-12-27 NOTE — ED Notes (Signed)
Pt O2 sat dropped to 89% on 5 liters, Pt with rhonchi and expiratory wheezing, verbal order given from Dr. Reita Cliche to give Duo neb treatment.

## 2016-12-28 ENCOUNTER — Inpatient Hospital Stay: Payer: Medicare Other

## 2016-12-28 DIAGNOSIS — R938 Abnormal findings on diagnostic imaging of other specified body structures: Secondary | ICD-10-CM

## 2016-12-28 DIAGNOSIS — G7080 Lambert-Eaton syndrome, unspecified: Secondary | ICD-10-CM

## 2016-12-28 DIAGNOSIS — R404 Transient alteration of awareness: Secondary | ICD-10-CM

## 2016-12-28 DIAGNOSIS — G702 Congenital and developmental myasthenia: Secondary | ICD-10-CM

## 2016-12-28 LAB — BASIC METABOLIC PANEL
Anion gap: 7 (ref 5–15)
BUN: 28 mg/dL — ABNORMAL HIGH (ref 6–20)
CHLORIDE: 101 mmol/L (ref 101–111)
CO2: 29 mmol/L (ref 22–32)
CREATININE: 1.01 mg/dL (ref 0.61–1.24)
Calcium: 8.3 mg/dL — ABNORMAL LOW (ref 8.9–10.3)
GFR calc Af Amer: >60 mL/min (ref >60)
GFR calc non Af Amer: >60 mL/min (ref >60)
Glucose, Bld: 152 mg/dL — ABNORMAL HIGH (ref 65–99)
Potassium: 4.4 mmol/L (ref 3.5–5.1)
Sodium: 137 mmol/L (ref 135–145)

## 2016-12-28 LAB — CBC WITH DIFFERENTIAL/PLATELET
BASOS ABS: 0 10*3/uL (ref 0–0.1)
BASOS PCT: 0 %
EOS ABS: 0 10*3/uL (ref 0–0.7)
EOS PCT: 0 %
HCT: 38.4 % — ABNORMAL LOW (ref 40.0–52.0)
HEMOGLOBIN: 12.5 g/dL — AB (ref 13.0–18.0)
Lymphocytes Relative: 5 %
Lymphs Abs: 0.5 10*3/uL — ABNORMAL LOW (ref 1.0–3.6)
MCH: 29.4 pg (ref 26.0–34.0)
MCHC: 32.7 g/dL (ref 32.0–36.0)
MCV: 90 fL (ref 80.0–100.0)
Monocytes Absolute: 1.1 10*3/uL — ABNORMAL HIGH (ref 0.2–1.0)
Monocytes Relative: 10 %
NEUTROS PCT: 85 %
Neutro Abs: 8.8 10*3/uL — ABNORMAL HIGH (ref 1.4–6.5)
PLATELETS: 190 10*3/uL (ref 150–440)
RBC: 4.26 MIL/uL — AB (ref 4.40–5.90)
RDW: 14.3 % (ref 11.5–14.5)
WBC: 10.5 10*3/uL (ref 3.8–10.6)

## 2016-12-28 LAB — GLUCOSE, CAPILLARY
GLUCOSE-CAPILLARY: 107 mg/dL — AB (ref 65–99)
GLUCOSE-CAPILLARY: 139 mg/dL — AB (ref 65–99)
Glucose-Capillary: 157 mg/dL — ABNORMAL HIGH (ref 65–99)
Glucose-Capillary: 88 mg/dL (ref 65–99)
Glucose-Capillary: 116 mg/dL — ABNORMAL HIGH (ref 65–99)
Glucose-Capillary: 100 mg/dL — ABNORMAL HIGH (ref 65–99)

## 2016-12-28 LAB — PROCALCITONIN

## 2016-12-28 LAB — TROPONIN I: Troponin I: 0.55 ng/mL (ref <0.03)

## 2016-12-28 MED ORDER — ENOXAPARIN SODIUM 40 MG/0.4ML ~~LOC~~ SOLN
40.0000 mg | SUBCUTANEOUS | Status: DC
  Administered 2016-12-28 – 2017-01-01 (×5): 40 mg via SUBCUTANEOUS
  Filled 2016-12-28 (×5): qty 0.4

## 2016-12-28 MED ORDER — IPRATROPIUM-ALBUTEROL 0.5-2.5 (3) MG/3ML IN SOLN
3.0000 mL | RESPIRATORY_TRACT | Status: DC | PRN
  Administered 2016-12-28: 3 mL via RESPIRATORY_TRACT
  Filled 2016-12-28: qty 3

## 2016-12-28 MED ORDER — FENTANYL CITRATE (PF) 100 MCG/2ML IJ SOLN: 12.5000 ug | INTRAMUSCULAR | Status: DC | PRN

## 2016-12-28 MED ORDER — METOPROLOL TARTRATE 5 MG/5ML IV SOLN: 2.5000 mg | INTRAVENOUS | Status: DC | PRN

## 2016-12-28 MED ORDER — HYDRALAZINE HCL 20 MG/ML IJ SOLN: 10.0000 mg | INTRAMUSCULAR | Status: DC | PRN

## 2016-12-28 MED ORDER — ALBUTEROL SULFATE (2.5 MG/3ML) 0.083% IN NEBU
2.5000 mg | INHALATION_SOLUTION | RESPIRATORY_TRACT | Status: DC | PRN
  Administered 2016-12-29: 2.5 mg via RESPIRATORY_TRACT
  Filled 2016-12-28: qty 3

## 2016-12-28 MED ORDER — FUROSEMIDE 10 MG/ML IJ SOLN
40.0000 mg | Freq: Once | INTRAMUSCULAR | Status: AC
  Administered 2016-12-28: 40 mg via INTRAVENOUS
  Filled 2016-12-28: qty 4

## 2016-12-28 NOTE — Progress Notes (Addendum)
PULMONARY / CRITICAL CARE MEDICINE   Name: [name] MRN: [id number] DOB: Oct 07, 1925    ADMISSION DATE:  12/27/2016   PT PROFILE: 20M with decades long history of myasthenia gravis, well controlled for many years admitted via ED to ICU/PCCM service with 5 days of nasal/sinus congestion and 1-2 days of abnormal behavior and progressive dyspnea. In ED was episodically severe hypoxemia. Also, hypotension.   MAJOR EVENTS/TEST RESULTS: 03/31 Admission as above 03/31 CT chest: No PE. Mild bilateral atelectasis. Mild interstitial prominence c/w pneumonitis vs edema 04/01 Neurology consultation: illness not deemed to be due to myasthenia. No changes in therapy planned 04/01 Cardiology consultation: likely demand ischemia. No intervention planned. Echocardiogram ordered 04/02 Echocardiogram:   INDWELLING DEVICES:: ETT 03/31 >> 04/01 L [place] CVL 03/31 >>   MICRO DATA: MRSA PCR 03/31 >> NEG Urine 03/31 >> UA negative Resp 03/31 >>  Blood 03/31  >>  Strep AG 03/31 >> NEG Legionella Ag 03/31 >>   PCT algorithm 03/31-04/02:  < 0.10, < 0.10,   ANTIMICROBIALS:  Vanc 03/31 >> 04/01 Azithromycin 03/31 >> Cefepime 03/31 >>   SUBJECTIVE:  Passed SBT, + F/C. Extubated and tolerating. Strong cough  VITAL SIGNS: BP 109/61   Pulse 71   Temp 98.6 F (37 C) (Axillary)   Resp 19   Ht 5\' 11"  (1.803 m)   Wt 211 lb 3.2 oz (95.8 kg)   SpO2 94%   BMI 29.46 kg/m   HEMODYNAMICS:    VENTILATOR SETTINGS: Vent Mode: PSV;CPAP FiO2 (%):  [35 %-50 %] 35 % Set Rate:  [16 bmp] 16 bmp Vt Set:  [500 mL] 500 mL PEEP:  [5 cmH20] 5 cmH20 Pressure Support:  [5 cmH20] 5 cmH20 Plateau Pressure:  [16 cmH20] 16 cmH20  INTAKE / OUTPUT: I/O last 3 completed shifts: In: 110 [I.V.:110] Out: 1900 [Urine:1900]  PHYSICAL EXAMINATION: General: WDWN, appears younger than true age, NAD after extubation Neuro: CNs intact, MAEs, normal strength, patellar reflexes 0/4 symmetrically HEENT: NCAT, sclerae  white Cardiovascular: regular, no M noted Lungs: diffuse rhonchi, wheezes Abdomen: soft, mildly distended, + BS Ext: normal muscle bulk, no edema, warm  LABS:  BMET  Recent Labs Lab 12/27/16 1053 12/28/16 0559  NA 136 137  K 4.5 4.4  CL 98* 101  CO2 32 29  BUN 27* 28*  CREATININE 1.11 1.01  GLUCOSE 138* 152*    Electrolytes  Recent Labs Lab 12/27/16 1053 12/28/16 0559  CALCIUM 8.7* 8.3*    CBC  Recent Labs Lab 12/27/16 1053 12/28/16 0559  WBC 7.2 10.5  HGB 13.4 12.5*  HCT 41.5 38.4*  PLT 157 190    Coag's No results for input(s): APTT, INR in the last 168 hours.  Sepsis Markers  Recent Labs Lab 12/27/16 1053 12/27/16 1510 12/28/16 0559  LATICACIDVEN 1.1  --   --   PROCALCITON  --  <0.10 <0.10    ABG  Recent Labs Lab 12/27/16 1340  PHART 7.31*  PCO2ART 60*  PO2ART 88    Liver Enzymes  Recent Labs Lab 12/27/16 1053  AST 27  ALT 22  ALKPHOS 54  BILITOT 0.8  ALBUMIN 3.9    Cardiac Enzymes  Recent Labs Lab 12/27/16 1510 12/27/16 2042 12/28/16 0559  TROPONINI 0.56* 0.26* 0.55*    Glucose  Recent Labs Lab 12/27/16 1958 12/27/16 2348 12/28/16 0415 12/28/16 0723  GLUCAP 216* 200* 157* 139*    CXR: NSC   ASSESSMENT / PLAN:  PULMONARY A: Acute hypoxic respiratory failure -  etiology remains unclear Former (remote) smoker - no prior history of COPD Wheezing, resolved Minimal pulmonary infiltrates - pneumonitis vs edema LLL Atx Hypoxemia out of proportion to radiographic findings P:   Monitor in ICU post extubation Supplemental O2 to maintain SpO2 > 90% Change bronchodilators to PRN only Furosemide X 1 04/01  CARDIOVASCULAR A:  H/O CAD Hypotension, resolved - off vasopressors Mildly elevated trop I - deemed demand ischemia P:  Recheck trop I in AM 04/02 Echocardiogram with bubble study ordered - will be performed 04/02  RENAL A:   No acute issues P:   Monitor BMET intermittently Monitor  I/Os Correct electrolytes as indicated  GASTROINTESTINAL A:   No issues P:   SUP: IV pantoprazole NPO post extubation Consider diet later in day today NGT to remain in place for meds until taking POs  HEMATOLOGIC A:   No acute issues P:  DVT px: enoxaparin Monitor CBC intermittently Transfuse per usual guidelines  INFECTIOUS A:   Possible PNA - little evidence to support  Immunocompromised (Cellcept) P:   Monitor temp, WBC count Micro and abx as above Consider DC of abx 04/02 if PCT remains normal  ENDOCRINE A:   Mild hyperglycemia without history of DM P:   Monitor glu on chem panels Consider SSI for glu > 180  NEUROLOGIC A:   Myasthenia Acute delirium ICU/associated discomfort P:   RASS goal: 0 Avoid sedating meds   FAMILY  Updates: Daughter and son updated @ bedside and care plan discussed   CCM time: 35 mins The above time includes time spent in consultation with patient and/or family members and rechecks post extubation  Merton Border, MD PCCM service Mobile [phone] Pager [phone] 12/28/2016, 12:01 PM

## 2016-12-28 NOTE — Consult Note (Signed)
[facility] Cardiology  CARDIOLOGY CONSULT NOTE  Patient ID: ANAV LAMMERT MRN: [id number] DOB/AGE: [birth year] 80 y.o.  Admit date: 12/27/2016 Referring Physician Shawna Orleans Primary Physician C Scott Primary Cardiologist Nehemiah Massed Reason for Consultation Elevated troponin  HPI: 83 year old gentleman referred for evaluation of elevated troponin. Patient has known history of myasthenia gravis, since with 5 day history of increased mental status, progressive exertional dyspnea with severe hypoxemia. She was admitted to the ICU with respiratory failure and hypotension. Patient denies chest pain. ECG revealed sinus rhythm without acute ischemic ST-T wave changes. Admission labs were notable for borderline elevated troponin of 0.55.  Review of systems complete and found to be negative unless listed above     Past Medical History:  Diagnosis Date  . Allergy   . Anemia   . 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 BCG treatments by Dr Jacqlyn Larsen.  . Chicken pox   . Colon polyp   . 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 Essential hypertension, benign   . Hypercholesterolemia   . Myasthenia gravis ([place])   . Shingles 2013    Past Surgical History:  Procedure Laterality Date  . APPENDECTOMY  1958  . CHOLECYSTECTOMY  1979  . HERNIA REPAIR  1970  . TONSILLECTOMY  1958    Prescriptions Prior to Admission  Medication Sig Dispense Refill Last Dose  . aspirin 81 MG tablet Take 81 mg by mouth daily.   unknown at unknown  . bimatoprost (LUMIGAN) 0.01 % SOLN Place 1 drop into the right eye every other day.    unknown at unknown  . dextromethorphan (DELSYM) 30 MG/5ML liquid Take by mouth as needed for cough.   unknown at unknown  . finasteride (PROSCAR) 5 MG tablet Take 5 mg by mouth daily.   unknown at unknown  . furosemide (LASIX) 20 MG tablet Take 20 mg by mouth daily.   unknown at unknown  . isosorbide mononitrate (IMDUR) 30 MG 24 hr tablet Take 30 mg by mouth daily.   unknown at unknown  . lisinopril (PRINIVIL,ZESTRIL) 5  MG tablet Take 5 mg by mouth daily.   unknown at unknown  . mycophenolate (CELLCEPT) 250 MG capsule Take 1,000 mg by mouth 2 (two) times daily.    unknown at unknown  . nystatin cream (MYCOSTATIN) Apply 1 application topically 2 (two) times daily. 30 g 0 unknown at unknown  . pravastatin (PRAVACHOL) 20 MG tablet Take 1 tablet (20 mg total) by mouth daily. 90 tablet 1 unknown at unknown  . pyridostigmine (MESTINON) 60 MG tablet Take 30 mg by mouth 6 (six) times daily.    unknown at unknown  . tamsulosin (FLOMAX) 0.4 MG CAPS Take 0.4 mg by mouth daily.   unknown at unknown   Social History   Social History  . Marital status: Married    Spouse name: N/A  . Number of children: N/A  . Years of education: N/A   Occupational History  . Not on file.   Social History Main Topics  . Smoking status: Former Smoker    Quit date: 09/29/1958  . Smokeless tobacco: Never Used  . Alcohol use 0.0 oz/week     Comment: occasionally  . Drug use: No  . Sexual activity: No   Other Topics Concern  . Not on file   Social History Narrative   Pt is married with 2 children - 1 son, 1 daughter. Previously self-employed in [employer]    Family History  Problem Relation Age of Onset  .  Lung cancer Sister   . Prostate cancer Brother   . Lung cancer Brother   . Arthritis      parent  . Colon cancer Neg Hx       Review of systems complete and found to be negative unless listed above      PHYSICAL EXAM  General: Well developed, well nourished, in no acute distress HEENT:  Normocephalic and atramatic Neck:  No JVD.  Lungs: Clear bilaterally to auscultation and percussion. Heart: HRRR . Normal S1 and S2 without gallops or murmurs.  Abdomen: Bowel sounds are positive, abdomen soft and non-tender  Msk:  Back normal, normal gait. Normal strength and tone for age. Extremities: No clubbing, cyanosis or edema.   Neuro: Alert and oriented X 3. Psych:  Good affect, responds appropriately  Labs:    Lab Results  Component Value Date   WBC 10.5 12/28/2016   HGB 12.5 (L) 12/28/2016   HCT 38.4 (L) 12/28/2016   MCV 90.0 12/28/2016   PLT 190 12/28/2016    Recent Labs Lab 12/27/16 1053 12/28/16 0559  NA 136 137  K 4.5 4.4  CL 98* 101  CO2 32 29  BUN 27* 28*  CREATININE 1.11 1.01  CALCIUM 8.7* 8.3*  PROT 7.0  --   BILITOT 0.8  --   ALKPHOS 54  --   ALT 22  --   AST 27  --   GLUCOSE 138* 152*   Lab Results  Component Value Date   TROPONINI 0.55 (HH) 12/28/2016    Lab Results  Component Value Date   CHOL 177 09/09/2016   CHOL 142 03/25/2016   CHOL 147 09/28/2015   Lab Results  Component Value Date   HDL 56.10 09/09/2016   HDL 47.80 03/25/2016   HDL 43.40 09/28/2015   Lab Results  Component Value Date   LDLCALC 98 09/09/2016   LDLCALC 79 03/25/2016   LDLCALC 89 09/28/2015   Lab Results  Component Value Date   TRIG 62 12/27/2016   TRIG 116.0 09/09/2016   TRIG 75.0 03/25/2016   Lab Results  Component Value Date   CHOLHDL 3 09/09/2016   CHOLHDL 3 03/25/2016   CHOLHDL 3 09/28/2015   Lab Results  Component Value Date   LDLDIRECT 143.5 08/26/2013      Radiology: Dg Abdomen 1 View  Result Date: 12/27/2016 CLINICAL DATA:  NG tube placement. EXAM: ABDOMEN - 1 VIEW COMPARISON:  None. FINDINGS: Enteric tube terminates in the left upper abdomen, likely in the proximal to mid gastric body with side hole projecting over the distal esophagus. Gas is visualized in nondilated colon in the upper abdomen. The lower abdomen was not imaged. Lumbar spondylosis is noted. The lung bases are more fully evaluated on concurrent chest radiograph. IMPRESSION: Enteric tube tip in the stomach with side hole in the distal esophagus. Electronically Signed   By: Logan Bores M.D.   On: 12/27/2016 14:18   Ct Angio Chest Pe W/cm &/or Wo Cm  Result Date: 12/27/2016 CLINICAL DATA:  Dizziness and increased shortness of breath over last 3 weeks EXAM: CT ANGIOGRAPHY CHEST WITH CONTRAST  TECHNIQUE: Multidetector CT imaging of the chest was performed using the standard protocol during bolus administration of intravenous contrast. Multiplanar CT image reconstructions and MIPs were obtained to evaluate the vascular anatomy. CONTRAST:  75 cc Isovue 370 IV COMPARISON:  CT chest 06/13/2016 FINDINGS: Cardiovascular: Atherosclerotic calcifications aorta, great vessels and coronary arteries. Aneurysmal dilatation of the distal aortic arch 4.2 cm transverse.  Ascending aorta measures up to 3.6 cm transverse. No evidence of aortic dissection. Pulmonary arteries well opacified and patent. No evidence of pulmonary embolism. Mediastinum/Nodes: Esophagus unremarkable. Base of cervical region normal appearance. Few normal sized mediastinal and RIGHT hilar nodes without definite thoracic adenopathy. Lungs/Pleura: Scattered atelectasis in both lungs with consolidation in LEFT lower lobe. Minimal scattered interstitial prominence which could represent minimal edema or infiltrate. No pleural effusion or pneumothorax. Upper Abdomen: Unremarkable Musculoskeletal: Mild superior endplate compression deformity of a mid thoracic vertebra appears old. Views osseous demineralization. Schmorl's node at superior endplate of W25. Review of the MIP images confirms the above findings. IMPRESSION: No evidence of pulmonary embolism. 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 Coronary arterial calcification. Scattered atelectasis in both lungs with minimal interstitial prominence which could represent minimal edema or infiltrate. Recommend semi-annual imaging of the thoracic aorta followup by chest CTA or MRA and referral to cardiothoracic surgery if not already obtained. This recommendation follows 2010 ACCF/AHA/AATS/ACR/ASA/SCA/SCAI/SIR/STS/SVM Guidelines for the Diagnosis and Management of Patients With Thoracic Aortic Disease. Circulation. 2010; 121: E527-P82  Electronically Signed   By: Lavonia Dana M.D.   On: 12/27/2016 13:19   Dg Chest Port 1 View  Result Date: 12/28/2016 CLINICAL DATA:  Respiratory failure. EXAM: PORTABLE CHEST 1 VIEW COMPARISON:  Yesterday. FINDINGS: Endotracheal tube in satisfactory position. Nasogastric tube tip in the mid stomach. The side hole is not well visualized, most likely in the proximal stomach. Left subclavian catheter tip most likely at the lateral aspect of the origin of the superior vena cava. Stable enlarged cardiac silhouette. No significant change in left lower lobe airspace opacity. Small amount of linear density in the right lower lung zone. Stable mild prominence of the interstitial markings. Aortic arch calcification. IMPRESSION: 1. Stable left lower lobe atelectasis or pneumonia. 2. Minimal right basilar atelectasis. 3. Stable cardiomegaly, mild chronic interstitial lung disease and aortic atherosclerosis. Electronically Signed   By: Claudie Revering M.D.   On: 12/28/2016 07:16   Dg Chest Portable 1 View  Result Date: 12/27/2016 CLINICAL DATA:  Central line placement EXAM: PORTABLE CHEST 1 VIEW COMPARISON:  Portable exam 1457 hours compared to 1355 hours FINDINGS: Tip of endotracheal tube projects 6.3 cm above carina, tip directed toward RIGHT lateral wall. Nasogastric tube extends into stomach. LEFT subclavian central venous catheter tip projecting over SVC. External pacing leads present. Enlargement of cardiac silhouette. Atherosclerotic calcification aorta. Bibasilar atelectasis with questionable perihilar infiltrate versus edema. No pleural effusion or pneumothorax. IMPRESSION: No pneumothorax following central line placement. Bibasilar atelectasis with question mild perihilar infiltrate or edema. Electronically Signed   By: Lavonia Dana M.D.   On: 12/27/2016 15:15   Dg Chest Port 1 View  Result Date: 12/27/2016 CLINICAL DATA:  Status post intubation. EXAM: PORTABLE CHEST 1 VIEW COMPARISON:  12/27/2016 at 10:42 a.m.  FINDINGS: Endotracheal tube tip abuts the right lateral aspect of the thoracic trachea, 3 cm above the carina. Nasogastric tube passes below the diaphragm and into the stomach. Persistent lung base opacity is noted consistent with atelectasis. No new lung abnormalities. No pneumothorax. IMPRESSION: 1. Endotracheal tube tip projects 3 cm above the carina. 2. Nasogastric tube is well positioned passing below the diaphragm well into the stomach. Electronically Signed   By: Lajean Manes M.D.   On: 12/27/2016 14:21   Dg Chest Port 1 View  Result Date: 12/27/2016 CLINICAL DATA:  Chest congestion, nonproductive cough and shortness of breath. EXAM: PORTABLE CHEST 1  VIEW COMPARISON:  06/16/2014. FINDINGS: Stable borderline enlarged cardiac silhouette. Aortic arch calcifications. Mildly elevated left hemidiaphragm. Resolved bibasilar atelectasis with interval minimal linear density laterally. Minimally prominent interstitial markings. Diffuse osteopenia. IMPRESSION: 1. Minimal interstitial pneumonitis or edema. 2. Minimal left basilar linear atelectasis or scarring. 3. Aortic atherosclerosis. Electronically Signed   By: Claudie Revering M.D.   On: 12/27/2016 11:08    EKG: Normal sinus rhythm  ASSESSMENT AND PLAN:   1. Borderline elevated troponin, in the absence of chest pain, with normal ECG, in the setting of respiratory failure and hypotension, likely demand supply ischemia 2. Respiratory failure, pneumonitis, slowly improving on nebulized steroid/bronchodilators  Recommendations  1. Continue current medications 2. Defer full dose anticoagulation 3. Continue to cycle cardiac enzymes 4. Review 2-D echocardiogram   Signed: Isaias Cowman MD,PhD, [facility] 12/28/2016, 10:01 AM

## 2016-12-28 NOTE — Progress Notes (Signed)
Pt extubated without complications, no stridor, sats 95% on 2lpm [place], strong cough, respiratory rate 18/min

## 2016-12-28 NOTE — Consult Note (Signed)
Reason for Consult:hx of MG and lambert eaton syndrome.   Referring Physician: Dr. Alva Garnet  CC: hx of MG and lambert eaton syndrome  HPI: [name] is an 83 y.o. 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 He is maintained on pyridostigmine and mycophenolate. He was last seen on 03/26 and was in [facility] with no changes in medications.    Presented with nasal and sinus congestion. Over 1-2 days prior to admission he developed mild, intermittent alteration in cognition and progressive dyspnea. He was brought to ED by family and was found to have episodes of profound hypoxemia (SpO2 as low as mid 40% initially). He ultimately required intubation for hypoxemia and dyspnea.   This AM pt was extubated and following commands.  He was able to provide his history to me.    Past Medical History:  Diagnosis Date  . Allergy   . Anemia   . 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 BCG treatments by Dr Jacqlyn Larsen.  . Chicken pox   . Colon polyp   . 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 Essential hypertension, benign   . Hypercholesterolemia   . Myasthenia gravis ([place])   . Shingles 2013    Past Surgical History:  Procedure Laterality Date  . APPENDECTOMY  1958  . CHOLECYSTECTOMY  1979  . HERNIA REPAIR  1970  . TONSILLECTOMY  1958    Family History  Problem Relation Age of Onset  . Lung cancer Sister   . Prostate cancer Brother   . Lung cancer Brother   . Arthritis      parent  . Colon cancer Neg Hx     Social History:  reports that he quit smoking about 58 years ago. He has never used smokeless tobacco. He reports that he drinks alcohol. He reports that he does not use drugs.  Allergies  Allergen Reactions  . Other Other (See Comments)    Calcium Channel Blocker - pre-synaptic blockade will precipitate acute weakness in Lambert-Eaton Syndrome Beta Blocker - pre-  and post- synaptic blockade will precipitate acute weakness in Lambert-Eaton Syndrome and Myasthenia Gravis SEE [email or web address]  . Ciprofloxacin Other (See Comments)    Last resort due to [place].   . Sulfa Antibiotics Rash    Medications: I have reviewed the patient's current medications.  ROS: History obtained from chart review and the patient  General ROS: negative for - chills, fatigue, fever, night sweats, weight gain or weight loss Psychological ROS: negative for - behavioral disorder, hallucinations, memory difficulties, mood swings or suicidal ideation Ophthalmic ROS: negative for - blurry vision, double vision, eye pain or loss of vision ENT ROS: negative for -mucous and nasal discharge  Allergy and Immunology ROS: negative for - hives or itchy/watery eyes Hematological and Lymphatic ROS: negative for - bleeding problems, bruising or swollen lymph nodes Endocrine ROS: negative for - galactorrhea, hair pattern changes, polydipsia/polyuria or temperature intolerance Respiratory ROS: negative for - cough, hemoptysis, shortness of breath or wheezing Cardiovascular ROS: negative for - chest pain, dyspnea on exertion, edema or irregular heartbeat Gastrointestinal ROS: negative for - abdominal pain, diarrhea, hematemesis, nausea/vomiting or stool incontinence Genito-Urinary ROS: negative for - dysuria, hematuria, incontinence or urinary frequency/urgency Musculoskeletal ROS: negative for - joint swelling or muscular weakness Neurological ROS: as noted in HPI Dermatological ROS: negative for rash and skin lesion changes  Physical Examination: Blood pressure 109/61, pulse 71, temperature 98.6 F (37 C), temperature source Axillary, resp. rate 19, height 5\' 11"  (1.803 m), weight 95.8 kg (211 lb 3.2 oz), SpO2 94 %.  HEENT-  Normocephalic, no lesions, without obvious abnormality.  Normal external eye and conjunctiva.  Normal TM's bilaterally.  Normal auditory canals and external  ears. Normal external nose, mucus membranes and septum.  Normal pharynx. Cardiovascular- regular rate and rhythm, S1, S2 normal, no murmur, click, rub or gallop, pulses palpable throughout   Lungs- Heart exam - S1, S2 normal, no murmur, no gallop, rate regular Abdomen- normal findings: no masses palpable Extremities- no joint deformities, effusion, or inflammation Lymph-no adenopathy palpable Musculoskeletal-no joint tenderness, deformity or swelling, full range of motion without pain Skin-warm and dry, no hyperpigmentation, vitiligo, or suspicious lesions  Neurological Examination   Mental Status: Alert, oriented,to name and place  Cranial Nerves: II: Discs flat bilaterally; Visual fields grossly normal, pupils equal, round, reactive to light and accommodation III,IV, VI: ptosis not present, extra-ocular motions intact bilaterally V,VII: smile symmetric, facial light touch sensation normal bilaterally VIII: hearing normal bilaterally IX,X: gag reflex present XI: bilateral shoulder shrug XII: midline tongue extension Motor: Pt has strong neck flexion and extension Right : Upper extremity   4+/5    Left:     Upper extremity   4+/5  Lower extremity   4/5     Lower extremity   4/5 Tone and bulk:normal tone throughout; no atrophy noted Sensory: Pinprick and light touch intact throughout, bilaterally Deep Tendon Reflexes: 1+ and symmetric throughout Plantars: Right: downgoing   Left: downgoing Cerebellar: normal finger-to-nose, normal rapid alternating movements and normal heel-to-shin test Gait: not tested.        Laboratory Studies:   Basic Metabolic Panel:  Recent Labs Lab 12/27/16 1053 12/28/16 0559  NA 136 137  K 4.5 4.4  CL 98* 101  CO2 32 29  GLUCOSE 138* 152*  BUN 27* 28*  CREATININE 1.11 1.01  CALCIUM 8.7* 8.3*    Liver Function Tests:  Recent Labs Lab 12/27/16 1053  AST 27  ALT 22  ALKPHOS 54  BILITOT 0.8  PROT 7.0  ALBUMIN 3.9   No results for  input(s): LIPASE, AMYLASE in the last 168 hours. No results for input(s): AMMONIA in the last 168 hours.  CBC:  Recent Labs Lab 12/27/16 1053 12/28/16 0559  WBC 7.2 10.5  NEUTROABS 6.1 8.8*  HGB 13.4 12.5*  HCT 41.5 38.4*  MCV 92.4 90.0  PLT 157 190    Cardiac Enzymes:  Recent Labs Lab 12/27/16 1053 12/27/16 1510 12/27/16 2042 12/28/16 0559  TROPONINI 0.56* 0.56* 0.26* 0.55*    BNP: Invalid input(s): POCBNP  CBG:  Recent Labs Lab 12/27/16 1958 12/27/16 2348 12/28/16 0415 12/28/16 0723  GLUCAP 216* 200* 157* 139*    Microbiology: Results for orders placed or performed during the hospital encounter of 12/27/16  Culture, blood (routine x 2)     Status: None (Preliminary result)   Collection Time: 12/27/16 10:53 AM  Result Value Ref Range Status   Specimen Description BLOOD RIGHT FOREARM  Final   Special Requests BOTTLES DRAWN AEROBIC AND ANAEROBIC BCAV  Final   Culture NO GROWTH < 24 HOURS  Final   Report Status PENDING  Incomplete  Culture, blood (routine x 2)     Status: None (Preliminary result)   Collection Time: 12/27/16 10:53 AM  Result Value Ref Range Status   Specimen Description BLOOD RIGHT ARM  Final  Special Requests BOTTLES DRAWN AEROBIC AND ANAEROBIC [place]  Final   Culture NO GROWTH < 24 HOURS  Final   Report Status PENDING  Incomplete  MRSA PCR Screening     Status: None   Collection Time: 12/27/16  4:30 PM  Result Value Ref Range Status   MRSA by PCR NEGATIVE NEGATIVE Final    Comment:        The GeneXpert MRSA Assay (FDA approved for NASAL specimens only), is one component of a comprehensive MRSA colonization surveillance program. It is not intended to diagnose MRSA infection nor to guide or monitor treatment for MRSA infections.     Coagulation Studies: No results for input(s): LABPROT, INR in the last 72 hours.  Urinalysis:  Recent Labs Lab 12/27/16 1515  COLORURINE YELLOW*  LABSPEC 1.016  PHURINE 5.0  GLUCOSEU  NEGATIVE  HGBUR SMALL*  BILIRUBINUR NEGATIVE  KETONESUR NEGATIVE  PROTEINUR NEGATIVE  NITRITE NEGATIVE  LEUKOCYTESUR NEGATIVE    Lipid Panel:     Component Value Date/Time   CHOL 177 09/09/2016 1039   TRIG 62 12/27/2016 1510   HDL 56.10 09/09/2016 1039   CHOLHDL 3 09/09/2016 1039   VLDL 23.2 09/09/2016 1039   LDLCALC 98 09/09/2016 1039    HgbA1C:  Lab Results  Component Value Date   HGBA1C 5.9 09/09/2016    Urine Drug Screen:  No results found for: LABOPIA, COCAINSCRNUR, LABBENZ, AMPHETMU, THCU, LABBARB  Alcohol Level: No results for input(s): ETH in the last 168 hours.  Other results: EKG: normal EKG, normal sinus rhythm, unchanged from previous tracings.  Imaging: Dg Abdomen 1 View  Result Date: 12/27/2016 CLINICAL DATA:  NG tube placement. EXAM: ABDOMEN - 1 VIEW COMPARISON:  None. FINDINGS: Enteric tube terminates in the left upper abdomen, likely in the proximal to mid gastric body with side hole projecting over the distal esophagus. Gas is visualized in nondilated colon in the upper abdomen. The lower abdomen was not imaged. Lumbar spondylosis is noted. The lung bases are more fully evaluated on concurrent chest radiograph. IMPRESSION: Enteric tube tip in the stomach with side hole in the distal esophagus. Electronically Signed   By: Logan Bores M.D.   On: 12/27/2016 14:18   Ct Angio Chest Pe W/cm &/or Wo Cm  Result Date: 12/27/2016 CLINICAL DATA:  Dizziness and increased shortness of breath over last 3 weeks EXAM: CT ANGIOGRAPHY CHEST WITH CONTRAST TECHNIQUE: Multidetector CT imaging of the chest was performed using the standard protocol during bolus administration of intravenous contrast. Multiplanar CT image reconstructions and MIPs were obtained to evaluate the vascular anatomy. CONTRAST:  75 cc Isovue 370 IV COMPARISON:  CT chest 06/13/2016 FINDINGS: Cardiovascular: Atherosclerotic calcifications aorta, great vessels and coronary arteries. Aneurysmal dilatation of  the distal aortic arch 4.2 cm transverse. Ascending aorta measures up to 3.6 cm transverse. No evidence of aortic dissection. Pulmonary arteries well opacified and patent. No evidence of pulmonary embolism. Mediastinum/Nodes: Esophagus unremarkable. Base of cervical region normal appearance. Few normal sized mediastinal and RIGHT hilar nodes without definite thoracic adenopathy. Lungs/Pleura: Scattered atelectasis in both lungs with consolidation in LEFT lower lobe. Minimal scattered interstitial prominence which could represent minimal edema or infiltrate. No pleural effusion or pneumothorax. Upper Abdomen: Unremarkable Musculoskeletal: Mild superior endplate compression deformity of a mid thoracic vertebra appears old. Views osseous demineralization. Schmorl's node at superior endplate of W11. Review of the MIP images confirms the above findings. IMPRESSION: No evidence of pulmonary embolism. 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 Coronary arterial calcification. Scattered atelectasis in both lungs with minimal interstitial prominence which could represent minimal edema or infiltrate. Recommend semi-annual imaging of the thoracic aorta followup by chest CTA or MRA and referral to cardiothoracic surgery if not already obtained. This recommendation follows 2010 ACCF/AHA/AATS/ACR/ASA/SCA/SCAI/SIR/STS/SVM Guidelines for the Diagnosis and Management of Patients With Thoracic Aortic Disease. Circulation. 2010; 121: K599-J57 Electronically Signed   By: Lavonia Dana M.D.   On: 12/27/2016 13:19   Dg Chest Port 1 View  Result Date: 12/28/2016 CLINICAL DATA:  Respiratory failure. EXAM: PORTABLE CHEST 1 VIEW COMPARISON:  Yesterday. FINDINGS: Endotracheal tube in satisfactory position. Nasogastric tube tip in the mid stomach. The side hole is not well visualized, most likely in the proximal stomach. Left subclavian catheter tip most likely at the  lateral aspect of the origin of the superior vena cava. Stable enlarged cardiac silhouette. No significant change in left lower lobe airspace opacity. Small amount of linear density in the right lower lung zone. Stable mild prominence of the interstitial markings. Aortic arch calcification. IMPRESSION: 1. Stable left lower lobe atelectasis or pneumonia. 2. Minimal right basilar atelectasis. 3. Stable cardiomegaly, mild chronic interstitial lung disease and aortic atherosclerosis. Electronically Signed   By: Claudie Revering M.D.   On: 12/28/2016 07:16   Dg Chest Portable 1 View  Result Date: 12/27/2016 CLINICAL DATA:  Central line placement EXAM: PORTABLE CHEST 1 VIEW COMPARISON:  Portable exam 1457 hours compared to 1355 hours FINDINGS: Tip of endotracheal tube projects 6.3 cm above carina, tip directed toward RIGHT lateral wall. Nasogastric tube extends into stomach. LEFT subclavian central venous catheter tip projecting over SVC. External pacing leads present. Enlargement of cardiac silhouette. Atherosclerotic calcification aorta. Bibasilar atelectasis with questionable perihilar infiltrate versus edema. No pleural effusion or pneumothorax. IMPRESSION: No pneumothorax following central line placement. Bibasilar atelectasis with question mild perihilar infiltrate or edema. Electronically Signed   By: Lavonia Dana M.D.   On: 12/27/2016 15:15   Dg Chest Port 1 View  Result Date: 12/27/2016 CLINICAL DATA:  Status post intubation. EXAM: PORTABLE CHEST 1 VIEW COMPARISON:  12/27/2016 at 10:42 a.m. FINDINGS: Endotracheal tube tip abuts the right lateral aspect of the thoracic trachea, 3 cm above the carina. Nasogastric tube passes below the diaphragm and into the stomach. Persistent lung base opacity is noted consistent with atelectasis. No new lung abnormalities. No pneumothorax. IMPRESSION: 1. Endotracheal tube tip projects 3 cm above the carina. 2. Nasogastric tube is well positioned passing below the diaphragm  well into the stomach. Electronically Signed   By: Lajean Manes M.D.   On: 12/27/2016 14:21   Dg Chest Port 1 View  Result Date: 12/27/2016 CLINICAL DATA:  Chest congestion, nonproductive cough and shortness of breath. EXAM: PORTABLE CHEST 1 VIEW COMPARISON:  06/16/2014. FINDINGS: Stable borderline enlarged cardiac silhouette. Aortic arch calcifications. Mildly elevated left hemidiaphragm. Resolved bibasilar atelectasis with interval minimal linear density laterally. Minimally prominent interstitial markings. Diffuse osteopenia. IMPRESSION: 1. Minimal interstitial pneumonitis or edema. 2. Minimal left basilar linear atelectasis or scarring. 3. Aortic atherosclerosis. Electronically Signed   By: Claudie Revering M.D.   On: 12/27/2016 11:08     Assessment/Plan:  83 y.o. 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 He is maintained on pyridostigmine and mycophenolate. Presented with nasal and sinus congestion. Over 1-2 days prior to admission  he developed mild, intermittent alteration in cognition and progressive dyspnea.He ultimately required intubation for hypoxemia and dyspnea. s/p extubation this AM   - On exam pt has strong flexion and extension  - No history of prior exacerbations of MG or LEMG (lambert eaton). No hx of IVIG of plasmaheresis - Able to count to 15 on one breath.   Please obtain NIFF and VC Not convinced this is MG or LEMG exacerbation as very rapid and rapid recovery.  Con't cont dose of CellCept and mestinon  No further imaging from neuro [place].    12/28/2016, 12:25 PM

## 2016-12-29 ENCOUNTER — Inpatient Hospital Stay: Admit: 2016-12-29 | Payer: Medicare Other

## 2016-12-29 ENCOUNTER — Inpatient Hospital Stay: Payer: Medicare Other

## 2016-12-29 LAB — GLUCOSE, CAPILLARY
GLUCOSE-CAPILLARY: 107 mg/dL — AB (ref 65–99)
GLUCOSE-CAPILLARY: 98 mg/dL (ref 65–99)
GLUCOSE-CAPILLARY: 133 mg/dL — AB (ref 65–99)
GLUCOSE-CAPILLARY: 136 mg/dL — AB (ref 65–99)
Glucose-Capillary: 110 mg/dL — ABNORMAL HIGH (ref 65–99)
Glucose-Capillary: 157 mg/dL — ABNORMAL HIGH (ref 65–99)
Glucose-Capillary: 179 mg/dL — ABNORMAL HIGH (ref 65–99)
Glucose-Capillary: 226 mg/dL — ABNORMAL HIGH (ref 65–99)

## 2016-12-29 LAB — EXPECTORATED SPUTUM ASSESSMENT W REFEX TO RESP CULTURE

## 2016-12-29 LAB — CBC
HEMATOCRIT: 40.5 % (ref 40.0–52.0)
Hemoglobin: 13 g/dL (ref 13.0–18.0)
MCH: 29.6 pg (ref 26.0–34.0)
MCHC: 32.1 g/dL (ref 32.0–36.0)
MCV: 92.2 fL (ref 80.0–100.0)
Platelets: 175 10*3/uL (ref 150–440)
RBC: 4.4 MIL/uL (ref 4.40–5.90)
RDW: 14.3 % (ref 11.5–14.5)
WBC: 8 10*3/uL (ref 3.8–10.6)

## 2016-12-29 LAB — BASIC METABOLIC PANEL
Anion gap: 9 (ref 5–15)
BUN: 27 mg/dL — ABNORMAL HIGH (ref 6–20)
CALCIUM: 8.5 mg/dL — AB (ref 8.9–10.3)
CO2: 33 mmol/L — AB (ref 22–32)
Chloride: 99 mmol/L — ABNORMAL LOW (ref 101–111)
Creatinine, Ser: 1 mg/dL (ref 0.61–1.24)
GLUCOSE: 105 mg/dL — AB (ref 65–99)
POTASSIUM: 4 mmol/L (ref 3.5–5.1)
Sodium: 141 mmol/L (ref 135–145)

## 2016-12-29 LAB — EXPECTORATED SPUTUM ASSESSMENT W GRAM STAIN, RFLX TO RESP C

## 2016-12-29 LAB — PROCALCITONIN

## 2016-12-29 LAB — TROPONIN I: Troponin I: 0.43 ng/mL (ref <0.03)

## 2016-12-29 LAB — LEGIONELLA PNEUMOPHILA SEROGP 1 UR AG: L. pneumophila Serogp 1 Ur Ag: NEGATIVE

## 2016-12-29 MED ORDER — MYCOPHENOLATE MOFETIL 200 MG/ML PO SUSR
1000.0000 mg | Freq: Two times a day (BID) | ORAL | Status: DC
  Administered 2016-12-30 – 2017-01-02 (×7): 1000 mg via ORAL
  Filled 2016-12-29 (×7): qty 5

## 2016-12-29 MED ORDER — METHYLPREDNISOLONE SODIUM SUCC 125 MG IJ SOLR
60.0000 mg | Freq: Three times a day (TID) | INTRAMUSCULAR | Status: DC
  Administered 2016-12-29 – 2016-12-30 (×3): 60 mg via INTRAVENOUS
  Filled 2016-12-29 (×3): qty 2

## 2016-12-29 MED ORDER — IPRATROPIUM-ALBUTEROL 0.5-2.5 (3) MG/3ML IN SOLN
3.0000 mL | Freq: Four times a day (QID) | RESPIRATORY_TRACT | Status: DC
  Administered 2016-12-29 – 2017-01-02 (×15): 3 mL via RESPIRATORY_TRACT
  Filled 2016-12-29 (×15): qty 3

## 2016-12-29 MED ORDER — DEXTROSE 5 % IV SOLN
1.0000 g | INTRAVENOUS | Status: AC
  Administered 2016-12-29 – 2016-12-31 (×3): 1 g via INTRAVENOUS
  Filled 2016-12-29 (×3): qty 10

## 2016-12-29 MED ORDER — FUROSEMIDE 10 MG/ML IJ SOLN
40.0000 mg | Freq: Two times a day (BID) | INTRAMUSCULAR | Status: AC
  Administered 2016-12-29 – 2016-12-30 (×2): 40 mg via INTRAVENOUS
  Filled 2016-12-29 (×2): qty 4

## 2016-12-29 MED ORDER — DEXTROSE 5 % IV SOLN
2.0000 g | INTRAVENOUS | Status: DC
  Filled 2016-12-29: qty 2

## 2016-12-29 MED ORDER — DIPHENHYDRAMINE HCL 50 MG/ML IJ SOLN
25.0000 mg | Freq: Once | INTRAMUSCULAR | Status: AC
  Administered 2016-12-30: 25 mg via INTRAVENOUS
  Filled 2016-12-29: qty 1

## 2016-12-29 MED ORDER — GUAIFENESIN-CODEINE 100-10 MG/5ML PO SOLN
10.0000 mL | ORAL | Status: DC | PRN
  Administered 2016-12-29 – 2016-12-30 (×2): 10 mL via ORAL
  Filled 2016-12-29 (×2): qty 10

## 2016-12-29 MED ORDER — MYCOPHENOLATE MOFETIL 200 MG/ML PO SUSR: 1000.0000 mg | Freq: Two times a day (BID) | ORAL | Status: DC

## 2016-12-29 NOTE — Progress Notes (Signed)
Called to bedside x 3 as daughter wanted to discuss pt's status. I entered the room, pt noted to be in no distress on 2L [place] @95 %. Daughter not in room, 2 family members sleeping at bedside.  Lungs: scattered wheezing, similar to am.  CXR reviewed again, bibasilar atelectasis, mild pulm vasc congestion.   Daughter came in upset, she noted that I came out of previous room without foaming, I explained that I did foam when arriving in the room, she was not present.  She was very upset and wanted to discuss patient transfer and also patient's condition.  I explained that we would try to do better in terms of hand hygiene, and explained that the patient has some wheezing. I would try starting the patient on steroids, and diuresis to see if this helps with the wheezing. I explained that there are other things that we could try but those would likely be more invasive.  She expressed that she wanted patient to be tested to make sure that the patient had not "caught something" from the unit, I spoke with her that I would recheck a MRSA screen, and asked if there were other things that would make her feel more comfortable. She expressed agreement with the plan, and the necessary orders were placed.   Ashby Dawes, M.D.  12/29/2016

## 2016-12-29 NOTE — Care Management (Signed)
Presented from home with shortness of breath. Was intubated in the ED and extubated 4.1.  Hx of myasthenia gravis. Advancing diet

## 2016-12-29 NOTE — Progress Notes (Signed)
PULMONARY / CRITICAL CARE MEDICINE   Name: [name] MRN: [id number] DOB: [birth year]    ADMISSION DATE:  12/27/2016   PT PROFILE: 3M with decades long history of myasthenia gravis, well controlled for many years admitted via ED to ICU/PCCM service with 5 days of nasal/sinus congestion and 1-2 days of abnormal behavior and progressive dyspnea. In ED was episodically severe hypoxemia. Also, hypotension.   MAJOR EVENTS/TEST RESULTS: 03/31 Admission as above 03/31 CT chest: No PE. Mild bilateral atelectasis. Mild interstitial prominence c/w pneumonitis vs edema 04/01 Neurology consultation: illness not deemed to be due to myasthenia. No changes in therapy planned 04/01 Cardiology consultation: likely demand ischemia. No intervention planned. Echocardiogram ordered; 04/02 Echocardiogram: Results pending  INDWELLING DEVICES:: ETT 03/31 >> 04/01 [place] CVL 03/31 >>   MICRO DATA: MRSA PCR 03/31 >> NEG Urine 03/31 >> UA negative Resp 03/31 >>  Blood 03/31  >>  Strep AG 03/31 >> NEG Legionella Ag 03/31 >>   PCT algorithm 03/31-04/02:  < 0.10, < 0.10,   ANTIMICROBIALS:  Vanc 03/31 >> 04/01 Azithromycin 03/31 >> Cefepime 03/31 >>   SUBJECTIVE:  Patient extubated yesterday, appears to be doing well, no new complaints today.  VITAL SIGNS: BP 137/69   Pulse 71   Temp 98.3 F (36.8 C) (Oral)   Resp (!) 21   Ht 5\' 11"  (1.803 m)   Wt 193 lb 9 oz (87.8 kg)   SpO2 93%   BMI 27.00 kg/m   HEMODYNAMICS:    VENTILATOR SETTINGS: Vent Mode: PSV;CPAP FiO2 (%):  [35 %] 35 % PEEP:  [5 cmH20] 5 cmH20 Pressure Support:  [5 cmH20] 5 cmH20  INTAKE / OUTPUT: I/O last 3 completed shifts: In: 2616.5 [I.V.:596.5; Other:1550; NG/GT:120; IV Piggyback:350] Out: 3800 [Urine:3800]  PHYSICAL EXAMINATION: General: WDWN, appears younger than true age, NAD after extubation Neuro: CNs intact, MAEs, normal strength, patellar reflexes 0/4 symmetrically HEENT: NCAT, sclerae  white Cardiovascular: regular, no M noted Lungs:Bilateral wheezing noted.  Abdomen: soft, mildly distended, + BS Ext: normal muscle bulk, no edema, warm  LABS:  BMET  Recent Labs Lab 12/27/16 1053 12/28/16 0559 12/29/16 0510  NA 136 137 141  K 4.5 4.4 4.0  CL 98* 101 99*  CO2 32 29 33*  BUN 27* 28* 27*  CREATININE 1.11 1.01 1.00  GLUCOSE 138* 152* 105*    Electrolytes  Recent Labs Lab 12/27/16 1053 12/28/16 0559 12/29/16 0510  CALCIUM 8.7* 8.3* 8.5*    CBC  Recent Labs Lab 12/27/16 1053 12/28/16 0559 12/29/16 0510  WBC 7.2 10.5 8.0  HGB 13.4 12.5* 13.0  HCT 41.5 38.4* 40.5  PLT 157 190 175    Coag's No results for input(s): APTT, INR in the last 168 hours.  Sepsis Markers  Recent Labs Lab 12/27/16 1053 12/27/16 1510 12/28/16 0559 12/29/16 0510  LATICACIDVEN 1.1  --   --   --   PROCALCITON  --  <0.10 <0.10 <0.10    ABG  Recent Labs Lab 12/27/16 1340  PHART 7.31*  PCO2ART 60*  PO2ART 88    Liver Enzymes  Recent Labs Lab 12/27/16 1053  AST 27  ALT 22  ALKPHOS 54  BILITOT 0.8  ALBUMIN 3.9    Cardiac Enzymes  Recent Labs Lab 12/27/16 2042 12/28/16 0559 12/29/16 0510  TROPONINI 0.26* 0.55* 0.43*    Glucose  Recent Labs Lab 12/28/16 1255 12/28/16 1602 12/28/16 1955 12/28/16 2352 12/29/16 0356 12/29/16 0713  GLUCAP 88 116* 100* 107* 107* 98  CXR: [place]   ASSESSMENT / PLAN:  PULMONARY A: Acute hypoxic respiratory failure - etiology remains unclear Former (remote) smoker - no prior history of COPD Wheezing, resolved Minimal pulmonary infiltrates - pneumonitis vs edema. Chest x-ray 4/2 reviewed, mild bibasilar atelectasis, otherwise unremarkable, minimal changes from previous. LLL Atx Hypoxemia out of proportion to radiographic findings P:   Monitor in ICU post extubation Supplemental O2 to maintain SpO2 > 90% Change tobronchodilators scheduled Furosemide X 1 04/01  CARDIOVASCULAR A:  H/O  CAD Hypotension, resolved - off vasopressors Mildly elevated trop I - deemed demand ischemia P:  Recheck trop I in AM 04/02 Echocardiogram with bubble study ordered - will be performed 04/02  RENAL A:   No acute issues P:   Monitor BMET intermittently Monitor I/Os Correct electrolytes as indicated  GASTROINTESTINAL A:   No issues P:   SUP: IV pantoprazole Consider diet later in day today NGT to remain in place for meds until taking POs  HEMATOLOGIC A:   No acute issues P:  DVT px: enoxaparin Monitor CBC intermittently Transfuse per usual guidelines  INFECTIOUS A:   Possible PNA - little evidence to support  Immunocompromised (Cellcept) P:   Monitor temp, WBC count Micro and abx as above Consider DC of abx 04/02 if PCT remains normal  ENDOCRINE A:   Mild hyperglycemia without history of DM P:   Monitor glu on chem panels Consider SSI for glu > 180  NEUROLOGIC A:   Myasthenia Acute delirium ICU/associated discomfort P:   RASS goal: 0 Avoid sedating meds   Marda Stalker, MD PCCM service Pager [phone] 12/29/2016, 8:34 AM

## 2016-12-29 NOTE — Evaluation (Signed)
Clinical/Bedside Swallow Evaluation Patient Details  Name: [name] MRN: [id number] Date of Birth: 03-16-1926  Today's Date: 12/29/2016 Time: SLP Start Time (ACUTE ONLY): 8144 SLP Stop Time (ACUTE ONLY): 1305 SLP Time Calculation (min) (ACUTE ONLY): 30 min  Past Medical History:  Past Medical History:  Diagnosis Date  . Allergy   . Anemia   . 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 BCG treatments by Dr Jacqlyn Larsen.  . Chicken pox   . Colon polyp   . 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 Essential hypertension, benign   . Hypercholesterolemia   . Myasthenia gravis ([place])   . Shingles 2013   Past Surgical History:  Past Surgical History:  Procedure Laterality Date  . APPENDECTOMY  1958  . CHOLECYSTECTOMY  1979  . HERNIA REPAIR  1970  . TONSILLECTOMY  1958   HPI:      Assessment / Plan / Recommendation Clinical Impression  pt presents with a moderate oral pharyngeal dysphagia characterized by increased a - p transit time, impaired mastication and bolus fomation and immediate cough with thin liquids. Daughter present in room for evaluation and was educated on findings. Daughter concerned for pt safety with foods. SLP educated on ability to swallow and cough related to pna vs cough for swallow safety. SLP will continue to monitor for safety with current recommended diet.  SLP Visit Diagnosis: Dysphagia, oropharyngeal phase (R13.12)    Aspiration Risk  Moderate aspiration risk    Diet Recommendation Dysphagia 2 (Fine chop);Nectar-thick liquid   Liquid Administration via: Cup;No straw Medication Administration: Crushed with puree Supervision: Patient able to self feed Compensations: Slow rate;Small sips/bites;Follow solids with liquid Postural Changes: Seated upright at 90 degrees    Other  Recommendations Other Recommendations: Remove water pitcher   Follow up Recommendations        Frequency and Duration min 3x week  2 weeks       Prognosis Prognosis for Safe Diet Advancement:  Good      Swallow Study   General Date of Onset: 12/29/16 Type of Study: Bedside Swallow Evaluation Diet Prior to this Study: NPO Temperature Spikes Noted: No Respiratory Status: Non-rebreather History of Recent Intubation: Yes Behavior/Cognition: Alert;Cooperative;Pleasant mood Oral Cavity Assessment: Within Functional Limits Oral Care Completed by SLP: No Oral Cavity - Dentition: Adequate natural dentition Vision: Functional for self-feeding Self-Feeding Abilities: Able to feed self Patient Positioning: Upright in bed Baseline Vocal Quality: Normal Volitional Cough: Strong Volitional Swallow: Able to elicit    Oral/Motor/Sensory Function Overall Oral Motor/Sensory Function: Within functional limits   Ice Chips Ice chips: Within functional limits Presentation: Spoon   Thin Liquid Thin Liquid: Impaired Presentation: Cup;Spoon Oral Phase Functional Implications: Right anterior spillage;Prolonged oral transit Pharyngeal  Phase Impairments: Suspected delayed Swallow;Throat Clearing - Immediate;Cough - Immediate    Nectar Thick Nectar Thick Liquid: Within functional limits Presentation: Spoon;Cup   Honey Thick Honey Thick Liquid: Not tested   Puree Puree: Within functional limits Presentation: Self Fed;Spoon   Solid   GO   Solid: Impaired Oral Phase Impairments: Impaired mastication Oral Phase Functional Implications: Oral residue;Prolonged oral transit Pharyngeal Phase Impairments: Cough - Immediate;Throat Clearing - Immediate;Wet Vocal Quality;Suspected delayed Swallow        [place] Sauber 12/29/2016,2:34 PM

## 2016-12-29 NOTE — Progress Notes (Deleted)
D5 infusion is being paused until Lactated Ringer with Potassium is finished due to concerns of fluid volume.

## 2016-12-29 NOTE — Progress Notes (Signed)
Mr Levitz daughter expressed that she wanted to speak with Dr Rockne Menghini regarding patients treatment plan. I called Dr Rockne Menghini and he stated that he would be up here to speak with her between 2 pm to 3 pm.

## 2016-12-29 NOTE — Progress Notes (Signed)
Patients daughter stated that she did not want Mr Obst to be tested with MRSA swab at this point. She stated she would let us know otherwise.

## 2016-12-30 ENCOUNTER — Inpatient Hospital Stay: Payer: Medicare Other

## 2016-12-30 LAB — BASIC METABOLIC PANEL
ANION GAP: 9 (ref 5–15)
BUN: 29 mg/dL — ABNORMAL HIGH (ref 6–20)
CALCIUM: 8.7 mg/dL — AB (ref 8.9–10.3)
CO2: 35 mmol/L — AB (ref 22–32)
CREATININE: 0.94 mg/dL (ref 0.61–1.24)
Chloride: 95 mmol/L — ABNORMAL LOW (ref 101–111)
GFR calc non Af Amer: >60 mL/min (ref >60)
GLUCOSE: 167 mg/dL — AB (ref 65–99)
Potassium: 4.1 mmol/L (ref 3.5–5.1)
Sodium: 139 mmol/L (ref 135–145)

## 2016-12-30 LAB — CBC WITH DIFFERENTIAL/PLATELET
Basophils Absolute: 0 10*3/uL (ref 0–0.1)
Basophils Relative: 0 %
EOS ABS: 0 10*3/uL (ref 0–0.7)
EOS PCT: 0 %
HCT: 43.1 % (ref 40.0–52.0)
Hemoglobin: 14.2 g/dL (ref 13.0–18.0)
LYMPHS ABS: 0.2 10*3/uL — AB (ref 1.0–3.6)
Lymphocytes Relative: 3 %
MCH: 29.4 pg (ref 26.0–34.0)
MCHC: 32.9 g/dL (ref 32.0–36.0)
MCV: 89.5 fL (ref 80.0–100.0)
MONOS PCT: 2 %
Monocytes Absolute: 0.1 10*3/uL — ABNORMAL LOW (ref 0.2–1.0)
Neutro Abs: 6.4 10*3/uL (ref 1.4–6.5)
Neutrophils Relative %: 95 %
PLATELETS: 207 10*3/uL (ref 150–440)
RBC: 4.82 MIL/uL (ref 4.40–5.90)
RDW: 14.3 % (ref 11.5–14.5)
WBC: 6.8 10*3/uL (ref 3.8–10.6)

## 2016-12-30 LAB — EXPECTORATED SPUTUM ASSESSMENT W REFEX TO RESP CULTURE

## 2016-12-30 LAB — GLUCOSE, CAPILLARY
GLUCOSE-CAPILLARY: 156 mg/dL — AB (ref 65–99)
GLUCOSE-CAPILLARY: 159 mg/dL — AB (ref 65–99)
Glucose-Capillary: 176 mg/dL — ABNORMAL HIGH (ref 65–99)
Glucose-Capillary: 120 mg/dL — ABNORMAL HIGH (ref 65–99)
Glucose-Capillary: 132 mg/dL — ABNORMAL HIGH (ref 65–99)
Glucose-Capillary: 125 mg/dL — ABNORMAL HIGH (ref 65–99)

## 2016-12-30 LAB — EXPECTORATED SPUTUM ASSESSMENT W GRAM STAIN, RFLX TO RESP C

## 2016-12-30 LAB — MAGNESIUM: Magnesium: 2.3 mg/dL (ref 1.7–2.4)

## 2016-12-30 MED ORDER — ACETAMINOPHEN 325 MG PO TABS: 650.0000 mg | ORAL_TABLET | ORAL | Status: DC | PRN

## 2016-12-30 MED ORDER — METHYLPREDNISOLONE SODIUM SUCC 40 MG IJ SOLR
40.0000 mg | INTRAMUSCULAR | Status: DC
  Administered 2016-12-31 – 2017-01-01 (×2): 40 mg via INTRAVENOUS
  Filled 2016-12-30 (×2): qty 1

## 2016-12-30 MED ORDER — SENNOSIDES 8.8 MG/5ML PO SYRP
5.0000 mL | ORAL_SOLUTION | Freq: Two times a day (BID) | ORAL | Status: DC | PRN
  Filled 2016-12-30: qty 5

## 2016-12-30 MED ORDER — PRAVASTATIN SODIUM 20 MG PO TABS
20.0000 mg | ORAL_TABLET | Freq: Every day | ORAL | Status: DC
  Administered 2016-12-30 – 2017-01-01 (×3): 20 mg via ORAL
  Filled 2016-12-30 (×5): qty 1

## 2016-12-30 MED ORDER — PYRIDOSTIGMINE BROMIDE 60 MG PO TABS
30.0000 mg | ORAL_TABLET | Freq: Every day | ORAL | Status: DC
  Administered 2016-12-30 – 2017-01-02 (×17): 30 mg via ORAL
  Filled 2016-12-30: qty 0.5
  Filled 2016-12-30 (×3): qty 1
  Filled 2016-12-30: qty 0.5
  Filled 2016-12-30 (×3): qty 1
  Filled 2016-12-30: qty 0.5
  Filled 2016-12-30 (×3): qty 1
  Filled 2016-12-30: qty 2
  Filled 2016-12-30: qty 0.5
  Filled 2016-12-30 (×5): qty 1

## 2016-12-30 NOTE — Care Management Note (Signed)
Case Management Note  Patient Details  Name: [name] MRN: [id number] Date of Birth: 05-19-1926  Subjective/Objective:                  Spoke with patient's daughter [name]. He lives with his wife and has no stairs to enter home. He has a walk-in shower.  His PCP is Dr. [name]. He is independent with mobility and drives (his wife does not drive). His daughter [name] will provide transportation to follow up appointment which she states she will arrange herself. She states he has access to a walker if needed. She does not feel he will need home health PT at discharge but will monitor and then decide based on patient request. Patient states he can afford his medications.   Action/Plan:  Currently no CM needs.   Expected Discharge Date:                  Expected Discharge Plan:     In-House Referral:     Discharge planning Services     Post Acute Care Choice:  Durable Medical Equipment, Home Health Choice offered to:  Adult Children  DME Arranged:    DME Agency:     HH Arranged:    HH Agency:     Status of Service:  In process, will continue to follow  If discussed at Long Length of Stay Meetings, dates discussed:    Additional Comments:  Marshell Garfinkel, RN 12/30/2016, 2:01 PM

## 2016-12-30 NOTE — Progress Notes (Signed)
Patient has chronic history of myasthenia gravis and admitted to intensive the service for acute hypoxic respiratory failure secondary to healthcare associated pneumonia. Intubated and extubated on April 1. Still wheezing secondary to  pneumonitis and volume overload. On Lasix and steroids

## 2016-12-30 NOTE — Evaluation (Signed)
Physical Therapy Evaluation Patient Details Name: [name] MRN: [id number] DOB: [birth year] Today's Date: 12/30/2016   History of Present Illness  Patient is an 83 yo M with a chronic history of myasthenia gravis and admitted to intensive care for acute hypoxic respiratory failure secondary to healthcare associated pneumonia. Intubated and extubated on April 1. Still wheezing secondary to pneumonitis and volume overload.    Clinical Impression  Pt presents with min deficits in strength, transfers, mobility, gait, balance, and activity tolerance.  Pt was SBA with bed mobility with extra time and effort required for tasks.  Pt required CGA with transfers with min instability upon initial stand but improved quickly during the session.  Pt able to ambulate around 10' at EOB with RW and CGA with SpO2 dropping to 88% briefly but mostly remaining 90-91% during the session. Pt tolerated session well with no c/o SOB or other symptoms.  Pt will benefit from PT services to address above deficits for decreased caregiver assistance upon discharge and will benefit from HHPT upon discharge to continue to progress towards return to prior level of function.       Follow Up Recommendations Home health PT    Equipment Recommendations  Rolling walker with 5" wheels (Depending on progress)    Recommendations for Other Services       Precautions / Restrictions Precautions Precautions: Fall Restrictions Weight Bearing Restrictions: No      Mobility  Bed Mobility Overal bed mobility: Needs Assistance Bed Mobility: Sit to Supine;Supine to Sit     Supine to sit: Supervision Sit to supine: Supervision   General bed mobility comments: Extra time and effort with bed mobility tasks but no physical assistance required  Transfers Overall transfer level: Needs assistance Equipment used: Rolling walker (2 wheeled) Transfers: Sit to/from Stand Sit to Stand: Min guard         General transfer  comment: Min instability upon initial stand but improved during session  Ambulation/Gait Ambulation/Gait assistance: Min guard Ambulation Distance (Feet): 10 Feet Assistive device: Rolling walker (2 wheeled)     Gait velocity interpretation: Below normal speed for age/gender General Gait Details: Ambulation at EOB forward/backwards and L/R, steady without LOB with SpO2 dropping to 88% at the lowest but mostly remaining 90-91% during session.  Stairs Stairs:  (Deferred)          Wheelchair Mobility    Modified Rankin (Stroke Patients Only)       Balance Overall balance assessment: Needs assistance Sitting-balance support: No upper extremity supported Sitting balance-Leahy Scale: Good     Standing balance support: No upper extremity supported Standing balance-Leahy Scale: Fair                               Pertinent Vitals/Pain Pain Assessment: No/denies pain    Home Living Family/patient expects to be discharged to:: Private residence Living Arrangements: Spouse/significant other Available Help at Discharge: Family;Available 24 hours/day Type of Home: House Home Access: Stairs to enter Entrance Stairs-Rails: Right;Left (Can not reach both) Entrance Stairs-Number of Steps: 4 Home Layout: One level Home Equipment: None      Prior Function Level of Independence: Independent         Comments: Pt Ind with amb community distances without AD with no fall history, pt active outdoors and continues to fish and work in the yard, Ind with ADLs     Hand Dominance   Dominant Hand: Right  Extremity/Trunk Assessment        Lower Extremity Assessment Lower Extremity Assessment: Generalized weakness       Communication   Communication: No difficulties  Cognition Arousal/Alertness: Awake/alert Behavior During Therapy: WFL for tasks assessed/performed Overall Cognitive Status: Within Functional Limits for tasks assessed                                         General Comments      Exercises Total Joint Exercises Ankle Circles/Pumps: AROM;Both;5 reps;10 reps Quad Sets: Strengthening;Both;5 reps;10 reps Gluteal Sets: Strengthening;Both;10 reps;5 reps Hip ABduction/ADduction: AROM;Both;5 reps Straight Leg Raises: AROM;Both;5 reps Long Arc Quad: AROM;Both;10 reps Knee Flexion: AROM;Both;10 reps Other Exercises Other Exercises: HEP education with pt and family with BLE APs, QS, and GS x 10 each 5-10x/day   Assessment/Plan    PT Assessment Patient needs continued PT services  PT Problem List Decreased strength;Decreased activity tolerance;Decreased balance;Decreased knowledge of use of DME       PT Treatment Interventions DME instruction;Gait training;Stair training;Functional mobility training;Neuromuscular re-education;Balance training;Therapeutic exercise;Therapeutic activities;Patient/family education    PT Goals (Current goals can be found in the Care Plan section)  Acute Rehab PT Goals Patient Stated Goal: "To get my strength back" PT Goal Formulation: With patient Time For Goal Achievement: 01/12/17 Potential to Achieve Goals: Good    Frequency Min 2X/week   Barriers to discharge        Co-evaluation               End of Session Equipment Utilized During Treatment: Gait belt;Oxygen Activity Tolerance: Patient tolerated treatment well Patient left: in bed;with call bell/phone within reach;with family/visitor present (Per pt/family SCDs recently removed by nursing) Nurse Communication: Mobility status PT Visit Diagnosis: Difficulty in walking, not elsewhere classified (R26.2);Muscle weakness (generalized) (M62.81)    Time: 9381-0175 PT Time Calculation (min) (ACUTE ONLY): 33 min   Charges:   PT Evaluation $PT Eval Low Complexity: 1 Procedure PT Treatments $Therapeutic Exercise: 8-22 mins   PT G Codes:        DRoyetta Asal PT, DPT 12/30/16, 1:14 PM

## 2016-12-30 NOTE — Progress Notes (Signed)
Instructed pt on how to use Flutter valve and encouraged pt to use 10 x's q1-q2 while awake. Pt had good effort.   12/30/16 0032  Chest Physiotherapy Tx  CPT Delivery Source Flutter valve  CPT Chest Site Full range  Post-Treatment Respirations 20  Cough Productive;Strong;Congested  Sputum Amount Small  Sputum Color Clear  Sputum Consistency Thick

## 2016-12-30 NOTE — Progress Notes (Signed)
[facility] Cardiology  SUBJECTIVE: Patient states he feels somewhat better this morning, breathing better, denies chest pain.   Vitals:   12/30/16 0448 12/30/16 0500 12/30/16 0700 12/30/16 0759  BP:  (!) 121/92 (!) 146/78   Pulse:  70 74   Resp:  (!) 23 (!) 27   Temp:   97.5 F (36.4 C)   TempSrc:      SpO2:  93% 91% 91%  Weight: 84.4 kg (186 lb 1.1 oz)     Height:         Intake/Output Summary (Last 24 hours) at 12/30/16 7939 Last data filed at 12/30/16 0500  Gross per 24 hour  Intake              750 ml  Output             2575 ml  Net            -1825 ml      PHYSICAL EXAM  General: Well developed, well nourished, in no acute distress HEENT:  Normocephalic and atramatic Neck:  No JVD.  Lungs: Normal effort of breathing, nebulizer treatment Heart: HRRR . Normal S1 and S2 without gallops or murmurs.  Abdomen: Bowel sounds are positive, abdomen soft and non-tender  Msk:  Patient lying in bed Extremities: No clubbing, cyanosis or edema.   Neuro: Alert and oriented X 3. Psych:  Good affect, responds appropriately   LABS: Basic Metabolic Panel:  Recent Labs  12/29/16 0510 12/30/16 0420  NA 141 139  K 4.0 4.1  CL 99* 95*  CO2 33* 35*  GLUCOSE 105* 167*  BUN 27* 29*  CREATININE 1.00 0.94  CALCIUM 8.5* 8.7*  MG  --  2.3   Liver Function Tests:  Recent Labs  12/27/16 1053  AST 27  ALT 22  ALKPHOS 54  BILITOT 0.8  PROT 7.0  ALBUMIN 3.9   No results for input(s): LIPASE, AMYLASE in the last 72 hours. CBC:  Recent Labs  12/28/16 0559 12/29/16 0510 12/30/16 0420  WBC 10.5 8.0 6.8  NEUTROABS 8.8*  --  6.4  HGB 12.5* 13.0 14.2  HCT 38.4* 40.5 43.1  MCV 90.0 92.2 89.5  PLT 190 175 207   Cardiac Enzymes:  Recent Labs  12/27/16 2042 12/28/16 0559 12/29/16 0510  TROPONINI 0.26* 0.55* 0.43*   BNP: Invalid input(s): POCBNP D-Dimer: No results for input(s): DDIMER in the last 72 hours. Hemoglobin A1C: No results for input(s): HGBA1C in the last  72 hours. Fasting Lipid Panel:  Recent Labs  12/27/16 1510  TRIG 62   Thyroid Function Tests: No results for input(s): TSH, T4TOTAL, T3FREE, THYROIDAB in the last 72 hours.  Invalid input(s): FREET3 Anemia Panel: No results for input(s): VITAMINB12, FOLATE, FERRITIN, TIBC, IRON, RETICCTPCT in the last 72 hours.  Dg Chest Port 1 View  Result Date: 12/29/2016 CLINICAL DATA:  Respiratory failure. EXAM: PORTABLE CHEST 1 VIEW COMPARISON:  12/28/2016 . FINDINGS: Interim extubation. Left subclavian line stable position. NG tube in stable position. Persistent bibasilar atelectasis. Heart size stable. Mild infiltrate right lung base cannot be excluded. No pleural effusion or pneumothorax . IMPRESSION: 1. Interim extubation. Left subclavian line and NG tube in stable position. 2. Persistent bibasilar atelectasis. Mild infiltrate right lung base cannot be excluded . Electronically Signed   By: Marcello Moores  Register   On: 12/29/2016 06:35     Echo: EF 50%, mild AS, mild AR, mild MR  TELEMETRY: NSR, 67 bpm  ASSESSMENT AND PLAN:  Active  Problems:   Acute respiratory failure ([place])    1. Borderline elevated troponin, trending down to 0.43, in the absence of chest pain, ECG without acute ST-T changes, in the setting of respiratory failure, likely demand ischemia 2. Respiratory failure with pneumonitis, slowly improving on current therapy  Recommendations: 1. Agree with current therapy 2. Defer full dose anticoagulation 2. No further cardiac diagnostics at this time.   Clabe Seal, PA-C 12/30/2016 8:29 AM

## 2016-12-30 NOTE — Progress Notes (Signed)
PULMONARY / CRITICAL CARE MEDICINE   Name: [name] MRN: [id number] DOB: Sep 25, 1926    ADMISSION DATE:  12/27/2016   PT PROFILE: 27M with decades long history of myasthenia gravis, well controlled for many years admitted via ED to ICU/PCCM service with 5 days of nasal/sinus congestion and 1-2 days of abnormal behavior and progressive dyspnea. In ED was episodically severe hypoxemia. Also, hypotension.   MAJOR EVENTS/TEST RESULTS: 03/31 Admission as above 03/31 CT chest: No PE. Mild bilateral atelectasis. Mild interstitial prominence c/w pneumonitis vs edema 04/01 Neurology consultation: illness not deemed to be due to myasthenia. No changes in therapy planned 04/01 Cardiology consultation: likely demand ischemia. No intervention planned. Echocardiogram ordered; extubated.  04/02 Echocardiogram: Results pending; Mild bilateral wheezing, started on steroids.   INDWELLING DEVICES:: ETT 03/31 >> 04/01 L [place] CVL 03/31 >>   MICRO DATA: MRSA PCR 03/31 >> NEG Urine 03/31 >> UA negative Resp 03/31 >>  Blood 03/31  >>  Strep AG 03/31 >> NEG Legionella Ag 03/31 >>  Resp 4/2>> negative.   PCT algorithm 03/31-04/02:  < 0.10, < 0.10,   ANTIMICROBIALS:  Vanc 03/31 >> 04/01 Azithromycin 03/31 >> Cefepime 03/31 >>   SUBJECTIVE:  Patient looking and feeling better today.   VITAL SIGNS: BP (!) 146/78   Pulse 74   Temp 97.5 F (36.4 C)   Resp (!) 27   Ht 5\' 11"  (1.803 m)   Wt 186 lb 1.1 oz (84.4 kg)   SpO2 91%   BMI 25.95 kg/m   HEMODYNAMICS:    VENTILATOR SETTINGS:    INTAKE / OUTPUT: I/O last 3 completed shifts: In: 1010 [P.O.:240; I.V.:350; NG/GT:120; IV Piggyback:300] Out: 3850 [Urine:3850]  PHYSICAL EXAMINATION: General: WDWN, appears younger than true age, NAD after extubation Neuro: CNs intact, MAEs, normal strength, patellar reflexes 0/4 symmetrically HEENT: NCAT, sclerae white Cardiovascular: regular, no M noted Lungs:CTA bilaterally.  Abdomen: soft,  mildly distended, + BS Ext: normal muscle bulk, no edema, warm  LABS:  BMET  Recent Labs Lab 12/28/16 0559 12/29/16 0510 12/30/16 0420  NA 137 141 139  K 4.4 4.0 4.1  CL 101 99* 95*  CO2 29 33* 35*  BUN 28* 27* 29*  CREATININE 1.01 1.00 0.94  GLUCOSE 152* 105* 167*    Electrolytes  Recent Labs Lab 12/28/16 0559 12/29/16 0510 12/30/16 0420  CALCIUM 8.3* 8.5* 8.7*  MG  --   --  2.3    CBC  Recent Labs Lab 12/28/16 0559 12/29/16 0510 12/30/16 0420  WBC 10.5 8.0 6.8  HGB 12.5* 13.0 14.2  HCT 38.4* 40.5 43.1  PLT 190 175 207    Coag's No results for input(s): APTT, INR in the last 168 hours.  Sepsis Markers  Recent Labs Lab 12/27/16 1053 12/27/16 1510 12/28/16 0559 12/29/16 0510  LATICACIDVEN 1.1  --   --   --   PROCALCITON  --  <0.10 <0.10 <0.10    ABG  Recent Labs Lab 12/27/16 1340  PHART 7.31*  PCO2ART 60*  PO2ART 88    Liver Enzymes  Recent Labs Lab 12/27/16 1053  AST 27  ALT 22  ALKPHOS 54  BILITOT 0.8  ALBUMIN 3.9    Cardiac Enzymes  Recent Labs Lab 12/27/16 2042 12/28/16 0559 12/29/16 0510  TROPONINI 0.26* 0.55* 0.43*    Glucose  Recent Labs Lab 12/29/16 1158 12/29/16 1608 12/29/16 1931 12/29/16 2355 12/30/16 0352 12/30/16 0723  GLUCAP 110* 136* 226* 179* 156* 176*    CXR: NSC  ASSESSMENT / PLAN:  PULMONARY A: Acute hypoxic respiratory failure - likely some degree of aspiration pneumonitis, now doing better.  Wheezing, resolved Pt had significant wheezing on 4/2 Started on steroids and lasix 4/2 with resolution of wheezing.   P:   Wean down steroids.  Supplemental O2 to maintain SpO2 > 90%   CARDIOVASCULAR A:  H/O CAD Hypotension, resolved - off vasopressors Mildly elevated trop I - deemed demand ischemia P:  Echocardiogram with bubble study ordered - results pending.   RENAL A:   No acute issues P:   Monitor BMET intermittently Monitor I/Os Correct electrolytes as  indicated  GASTROINTESTINAL A:   No issues P:   SUP: IV pantoprazole Consider diet later in day today NGT to remain in place for meds until taking POs  HEMATOLOGIC A:   No acute issues P:  DVT px: enoxaparin Monitor CBC intermittently   INFECTIOUS A:   Suspected PNA - little evidence to support  Immunocompromised ([place]) P:   Monitor temp, WBC count Micro and abx as above   ENDOCRINE A:   Mild hyperglycemia without history of DM P:   Monitor glu on chem panels Consider SSI for glu > 180  NEUROLOGIC A:   Myasthenia Acute delirium ICU/associated discomfort P:   RASS goal: 0 Avoid sedating meds   Marda Stalker, MD PCCM service Pager [phone] 12/30/2016, 9:17 AM

## 2016-12-30 NOTE — Progress Notes (Signed)
Made aware by ccmd patient had 3 run vtach, upon entering room, patient receiving chest pt. No distress per patient

## 2016-12-30 NOTE — Progress Notes (Signed)
Patient alert, no complaints of pain or shortness of breath. Patient is on 2l oxygenating 90%. Tolerating diet, foley removed and condom catheter placed. Worked with physical therapy. Family at bedside. Patient being tx.

## 2016-12-30 NOTE — Progress Notes (Signed)
  Speech Language Pathology Treatment: Dysphagia  Patient Details Name: [name] MRN: [id number] DOB: 31-Jan-1926 Today's Date: 12/30/2016 Time: 1224-8250 SLP Time Calculation (min) (ACUTE ONLY): 50 min  Assessment / Plan / Recommendation Clinical Impression  Pt appeared to adequately tolerate trials of thin liquids VIA CUP using general aspiration precautions and only 1-2 small sips at a time when drinking liquids. No immediate, overt s/s of aspiration noted w/ trials; one congested, mild cough noted while talking w/ pt b/t trials but this did not appear related to the po trials. Pt was recently extubated post ~2 days of oral intubation and has some increased tracheal phlegm/congestion. Pt does have a baseline of MG and Eaton-Lambert and does fatigue easily but has managed well w/ swallowing over the years per Daughter. She reports she has only witnessed inconsistent throat clearing; pt has not had a dx of pneumonia per Daughter.  Education given on aspiration precautions; strategies to support swallowing during meals; awareness of food/liquid consistencies. Recommend upgrading diet to Dysphagia level 3 w/ thin liquids - NO Straws. ST services will f/u w/ toleration of diet next 1-3 days. Pt/Daughter, wife agreed.   HPI HPI: Pt is an 83 y/o male who had nasal and sinus congestion over 1-2 days prior to admission. Then he developed mild, intermittent alteration in cognition and progressive dyspnea. He was brought to ED by family and was found to have episodes of profound hypoxemia (SpO2 as low as mid 40% initially). He ultimately required intubation for ~2 days for hypoxemia and dyspnea. Pt is highly functional at baseline on his own and is followed by [facility]rology at [facility] for myasthenia gravis since early 2000s and Eaton-Lambert for about 30 yrs as per family. He is maintained on pyridostigmine and mycophenolate. Currently, pt has been tolerating the dysphagia diet as ordered but would like to upgrade  his diet if able. Pt A/O x3; verbally conversive and following commands.       SLP Plan  Continue with current plan of care; NSG updated       Recommendations  Diet recommendations: Dysphagia 3 (mechanical soft);Thin liquid Liquids provided via: Cup;No straw Medication Administration: Whole meds with puree Supervision: Patient able to self feed;Intermittent supervision to cue for compensatory strategies (tray setup as needed) Compensations: Minimize environmental distractions;Slow rate;Small sips/bites;Lingual sweep for clearance of pocketing;Multiple dry swallows after each bite/sip;Follow solids with liquid (rest breaks; no talking when eating/drinking) Postural Changes and/or Swallow Maneuvers: Seated upright 90 degrees;Upright 30-60 min after meal                General recommendations:  (none at this time) Oral Care Recommendations: Oral care BID;Staff/trained caregiver to provide oral care;Patient independent with oral care Follow up Recommendations: None (TBD) SLP Visit Diagnosis: Dysphagia, oropharyngeal phase (R13.12) Plan: Continue with current plan of care       [place], [place], CCC-SLP Watson,Katherine 12/30/2016, 4:55 PM

## 2016-12-31 LAB — GLUCOSE, CAPILLARY
GLUCOSE-CAPILLARY: 144 mg/dL — AB (ref 65–99)
GLUCOSE-CAPILLARY: 78 mg/dL (ref 65–99)
Glucose-Capillary: 117 mg/dL — ABNORMAL HIGH (ref 65–99)
Glucose-Capillary: 158 mg/dL — ABNORMAL HIGH (ref 65–99)
Glucose-Capillary: 227 mg/dL — ABNORMAL HIGH (ref 65–99)

## 2016-12-31 LAB — CBC
HEMATOCRIT: 41.7 % (ref 40.0–52.0)
HEMOGLOBIN: 13.8 g/dL (ref 13.0–18.0)
MCH: 29.6 pg (ref 26.0–34.0)
MCHC: 33.1 g/dL (ref 32.0–36.0)
MCV: 89.3 fL (ref 80.0–100.0)
Platelets: 231 10*3/uL (ref 150–440)
RBC: 4.67 MIL/uL (ref 4.40–5.90)
RDW: 14.3 % (ref 11.5–14.5)
WBC: 10.1 10*3/uL (ref 3.8–10.6)

## 2016-12-31 LAB — BASIC METABOLIC PANEL
Anion gap: 7 (ref 5–15)
BUN: 39 mg/dL — AB (ref 6–20)
CHLORIDE: 96 mmol/L — AB (ref 101–111)
CO2: 38 mmol/L — AB (ref 22–32)
CREATININE: 0.86 mg/dL (ref 0.61–1.24)
Calcium: 8.7 mg/dL — ABNORMAL LOW (ref 8.9–10.3)
GFR calc non Af Amer: >60 mL/min (ref >60)
Glucose, Bld: 114 mg/dL — ABNORMAL HIGH (ref 65–99)
Potassium: 3.9 mmol/L (ref 3.5–5.1)
Sodium: 141 mmol/L (ref 135–145)

## 2016-12-31 LAB — MRSA PCR SCREENING: MRSA BY PCR: NEGATIVE

## 2016-12-31 MED ORDER — ZOLPIDEM TARTRATE 5 MG PO TABS
5.0000 mg | ORAL_TABLET | Freq: Every evening | ORAL | Status: DC | PRN
  Administered 2016-12-31 – 2017-01-02 (×2): 5 mg via ORAL
  Filled 2016-12-31 (×2): qty 1

## 2016-12-31 NOTE — Care Management (Signed)
Patient continues to require supplemental 02 and have discussed having patient assessed for home oxygen.  Spoke with patient and his daughter who is at bedside. She  verbalizes it is not possible to determine what patient may or may not need.  he has been intubated and in bed for 5 day and has not been out of the room to walk. CM spoke with PT and asked that patient be ambulated outside his room today if it is not contraindicated. Again discussed home health disciplines.  He is not sureDiscussed if he chose to have the service after discharge, he could call his doctor to get order.  Daughter says was informed that CM will set patient up with his pcp post discharge.  patient has established pcp. Discussed if needed, the unit secretary can set up a follow up appt with pcp.

## 2016-12-31 NOTE — Care Management Important Message (Signed)
Important Message  Patient Details  Name: [name] MRN: [id number] Date of Birth: [birth year]   Medicare Important Message Given:  Yes Signed notice given   Katrina Stack, RN 12/31/2016, 4:11 PM

## 2016-12-31 NOTE — Progress Notes (Signed)
qPhysical Therapy Treatment Patient Details Name: [name] MRN: [id number] DOB: October 03, 1925 Today's Date: 12/31/2016    History of Present Illness Patient is an 83 yo M with a chronic history of myasthenia gravis and admitted to intensive care for acute hypoxic respiratory failure secondary to healthcare associated pneumonia. Intubated and extubated on April 1. Still wheezing secondary to pneumonitis and volume overload.    PT Comments    Pt in chair, ready to ambulate.  Stood for exercises as described below.  Pt was then able to ambulate around nursing unit x 1 with walker and O2 support on 2 lpm with min guard and wc follow for safety.  Gait generally steady.  Sats did dip to 87% at end of gait but increased to 92% upon seated rest.  Overall did well.  Pt's daughter requesting daily PT if able. HR remained stable.   Follow Up Recommendations  Home health PT;Supervision for mobility/OOB     Equipment Recommendations  Rolling walker with 5" wheels    Recommendations for Other Services       Precautions / Restrictions Precautions Precautions: Fall Restrictions Weight Bearing Restrictions: No    Mobility  Bed Mobility               General bed mobility comments: in chair upon arrival  Transfers Overall transfer level: Needs assistance Equipment used: Rolling walker (2 wheeled) Transfers: Sit to/from Stand Sit to Stand: Min guard         General transfer comment: Min instability upon initial stand but improved during session  Ambulation/Gait Ambulation/Gait assistance: Min guard Ambulation Distance (Feet): 200 Feet Assistive device: Rolling walker (2 wheeled) Gait Pattern/deviations: Step-through pattern   Gait velocity interpretation: Below normal speed for age/gender     Stairs            Wheelchair Mobility    Modified Rankin (Stroke Patients Only)       Balance Overall balance assessment: Needs assistance Sitting-balance support:  Feet supported Sitting balance-Leahy Scale: Good     Standing balance support: Bilateral upper extremity supported Standing balance-Leahy Scale: Fair                              Cognition Arousal/Alertness: Awake/alert Behavior During Therapy: WFL for tasks assessed/performed Overall Cognitive Status: Within Functional Limits for tasks assessed                                        Exercises Other Exercises Other Exercises: standing exercises with walker for toe raises, marches, and SLR x 10    General Comments        Pertinent Vitals/Pain Pain Assessment: No/denies pain    Home Living                      Prior Function            PT Goals (current goals can now be found in the care plan section) Progress towards PT goals: Progressing toward goals    Frequency    Min 2X/week      PT Plan Current plan remains appropriate    Co-evaluation             End of Session Equipment Utilized During Treatment: Gait belt;Oxygen Activity Tolerance: Patient tolerated treatment well Patient left: in chair;with chair alarm set;with  call bell/phone within reach;with family/visitor present Nurse Communication: Mobility status       Time: 1017-5102 PT Time Calculation (min) (ACUTE ONLY): 25 min  Charges:  $Gait Training: 8-22 mins $Therapeutic Exercise: 8-22 mins                    G Codes:       {Kamla Skilton, PTA 12/31/16, 12:26 PM

## 2016-12-31 NOTE — Progress Notes (Signed)
Picc line removed as per order, site dry and intact no active bleeding noted

## 2016-12-31 NOTE — Progress Notes (Signed)
[facility] [place] NAME: [name]    MR#:  [id number]  DATE OF BIRTH:  11/22/1925  SUBJECTIVE:  Patient is out of bed to chair breathing is better but still coughing. Daughter at bedside. Transferred from ICU Requesting sleep aid  REVIEW OF SYSTEMS:  CONSTITUTIONAL: No fever, fatigue or weakness.  EYES: No blurred or double vision.  EARS, NOSE, AND THROAT: No tinnitus or ear pain.  RESPIRATORY: Reporting some cough, denies shortness of breath, wheezing or hemoptysis.  CARDIOVASCULAR: No chest pain, orthopnea, edema.  GASTROINTESTINAL: No nausea, vomiting, diarrhea or abdominal pain.  GENITOURINARY: No dysuria, hematuria.  ENDOCRINE: No polyuria, nocturia,  HEMATOLOGY: No anemia, easy bruising or bleeding SKIN: No rash or lesion. MUSCULOSKELETAL: No joint pain or arthritis.   NEUROLOGIC: No tingling, numbness, weakness.  PSYCHIATRY: No anxiety or depression.   DRUG ALLERGIES:   Allergies  Allergen Reactions  . 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 Ciprofloxacin Other (See Comments)    Last resort due to [place].   . Sulfa Antibiotics Rash    VITALS:  Blood pressure 119/66, pulse 80, temperature 97.5 F (36.4 C), temperature source Oral, resp. rate 16, height 5\' 11"  (1.803 m), weight 86.4 kg (190 lb 7.6 oz), SpO2 96 %.  PHYSICAL EXAMINATION:  GENERAL:  83 y.o.-year-old patient lying in the bed with no acute distress.  EYES: Pupils equal, round, reactive to light and accommodation. No scleral icterus. Extraocular muscles intact.  HEENT: Head atraumatic, normocephalic. Oropharynx and nasopharynx clear.  NECK:  Supple, no jugular venous distention. No thyroid  enlargement, no tenderness.  LUNGS:Moderate breath sounds bilaterally, no wheezing, rales,rhonchi or crepitation. No use of accessory muscles of respiration.  CARDIOVASCULAR: S1, S2 normal. No murmurs, rubs, or gallops.  ABDOMEN: Soft, nontender, nondistended. Bowel sounds present. No organomegaly or mass.  EXTREMITIES: No pedal edema, cyanosis, or clubbing.  NEUROLOGIC: Cranial nerves II through XII are intact. Muscle strength 5/5 in all extremities. Sensation intact. Gait not checked.  PSYCHIATRIC: The patient is alert and oriented x 3.  SKIN: No obvious rash, lesion, or ulcer.    LABORATORY PANEL:   CBC  Recent Labs Lab 12/31/16 0500  WBC 10.1  HGB 13.8  HCT 41.7  PLT 231   ------------------------------------------------------------------------------------------------------------------  Chemistries   Recent Labs Lab 12/27/16 1053  12/30/16 0420 12/31/16 0500  NA 136  < > 139 141  K 4.5  < > 4.1 3.9  CL 98*  < > 95* 96*  CO2 32  < > 35* 38*  GLUCOSE 138*  < > 167* 114*  BUN 27*  < > 29* 39*  CREATININE 1.11  < > 0.94 0.86  CALCIUM 8.7*  < > 8.7* 8.7*  MG  --   --  2.3  --   AST 27  --   --   --   ALT 22  --   --   --   ALKPHOS 54  --   --   --   BILITOT 0.8  --   --   --   < > = values in this interval not displayed. ------------------------------------------------------------------------------------------------------------------  Cardiac Enzymes  Recent Labs Lab 12/29/16 0510  TROPONINI 0.43*   ------------------------------------------------------------------------------------------------------------------  RADIOLOGY:  Dg Chest Port 1  View  Result Date: 12/30/2016 CLINICAL DATA:  Respiratory failure EXAM: PORTABLE CHEST 1 VIEW COMPARISON:  December 29, 2016 FINDINGS: Central catheter tip is in the left innominate vein. Nasogastric tube has been removed. No pneumothorax. There is persistent atelectasis in the left base with minimal left pleural effusion.  Lungs elsewhere are clear. Heart is borderline enlarged with pulmonary vascularity within normal limits. No adenopathy. There is atherosclerotic calcification aorta. Bones are osteoporotic. There is calcification in the left carotid artery. IMPRESSION: No pneumothorax. Mild left base atelectasis with small left pleural effusion. Lungs elsewhere clear. Stable cardiac silhouette. There is aortic atherosclerosis. Bones are osteoporotic. There is left carotid artery calcification. Electronically Signed   By: Lowella Grip III M.D.   On: 12/30/2016 13:47    EKG:   Orders placed or performed during the hospital encounter of 12/27/16  . EKG 12-Lead  . EKG 12-Lead  . EKG 12-Lead  . EKG 12-Lead    ASSESSMENT AND PLAN:   Patient has chronic history of myasthenia gravis and admitted to intensive the service for acute hypoxic respiratory failure secondary to healthcare associated pneumonia. Intubated and extubated on April 1. Still wheezing secondary to  pneumonitis and volume overload. On Lasix and steroids   #Acute hypoxic respiratory failure secondary to healthcare associated pneumonia and fluid overload  Patient was intubated and extubated by intensivist on April 1  Transfer to floor on April 3  with chronic history of myasthenia gravis Continue oxygen via nasal cannula Follow up with pulmonology Clinically feeling better   #Healthcare associated pneumonia Antibiotics were changed to Rocephin and azithromycin by pulmonology Nebulizer treatments   #Pneumonitis on Lasix and taper steroids Follow up with pulmonology  #Myasthenia gravis continue pyridostigmine, CellCept  #Insomnia Ambien as needed  PT is recommending home health PT      All the records are reviewed and case discussed with Care Management/Social Workerr. Management plans discussed with the patient, daughter and they are in agreement.  CODE STATUS: fc   TOTAL TIME TAKING CARE OF THIS PATIENT: 36 minutes.    POSSIBLE D/C IN 2 DAYS, DEPENDING ON CLINICAL CONDITION.  Note: This dictation was prepared with Dragon dictation along with smaller phrase technology. Any transcriptional errors that result from this process are unintentional.   Nicholes Mango M.D on 12/31/2016 at 5:26 PM  Between 7am to 6pm - Pager - [phone] After 6pm go to [email or web address] - password [email or web address] [facility]  Office  [phone]  CC: Primary care physician; [name], MD

## 2017-01-01 LAB — CULTURE, BLOOD (ROUTINE X 2)
CULTURE: NO GROWTH
Culture: NO GROWTH

## 2017-01-01 LAB — CBC WITH DIFFERENTIAL/PLATELET
BASOS ABS: 0 10*3/uL (ref 0–0.1)
Basophils Relative: 0 %
Eosinophils Absolute: 0 10*3/uL (ref 0–0.7)
Eosinophils Relative: 0 %
HEMATOCRIT: 42.6 % (ref 40.0–52.0)
HEMOGLOBIN: 14 g/dL (ref 13.0–18.0)
Lymphocytes Relative: 14 %
Lymphs Abs: 1.1 10*3/uL (ref 1.0–3.6)
MCH: 29.8 pg (ref 26.0–34.0)
MCHC: 32.8 g/dL (ref 32.0–36.0)
MCV: 91 fL (ref 80.0–100.0)
Monocytes Absolute: 0.5 10*3/uL (ref 0.2–1.0)
Monocytes Relative: 6 %
NEUTROS ABS: 6.7 10*3/uL — AB (ref 1.4–6.5)
NEUTROS PCT: 80 %
Platelets: 227 10*3/uL (ref 150–440)
RBC: 4.68 MIL/uL (ref 4.40–5.90)
RDW: 14.3 % (ref 11.5–14.5)
WBC: 8.4 10*3/uL (ref 3.8–10.6)

## 2017-01-01 LAB — BASIC METABOLIC PANEL
ANION GAP: 4 — AB (ref 5–15)
BUN: 34 mg/dL — ABNORMAL HIGH (ref 6–20)
CALCIUM: 8.6 mg/dL — AB (ref 8.9–10.3)
CHLORIDE: 96 mmol/L — AB (ref 101–111)
CO2: 39 mmol/L — AB (ref 22–32)
Creatinine, Ser: 0.93 mg/dL (ref 0.61–1.24)
GFR calc non Af Amer: >60 mL/min (ref >60)
Glucose, Bld: 106 mg/dL — ABNORMAL HIGH (ref 65–99)
Potassium: 4.1 mmol/L (ref 3.5–5.1)
Sodium: 139 mmol/L (ref 135–145)

## 2017-01-01 LAB — GLUCOSE, CAPILLARY
GLUCOSE-CAPILLARY: 94 mg/dL (ref 65–99)
GLUCOSE-CAPILLARY: 172 mg/dL — AB (ref 65–99)
GLUCOSE-CAPILLARY: 111 mg/dL — AB (ref 65–99)
GLUCOSE-CAPILLARY: 193 mg/dL — AB (ref 65–99)
Glucose-Capillary: 105 mg/dL — ABNORMAL HIGH (ref 65–99)
Glucose-Capillary: 143 mg/dL — ABNORMAL HIGH (ref 65–99)

## 2017-01-01 LAB — CULTURE, RESPIRATORY

## 2017-01-01 LAB — CULTURE, RESPIRATORY W GRAM STAIN: Culture: NORMAL

## 2017-01-01 MED ORDER — MAGNESIUM SULFATE 2 GM/50ML IV SOLN
2.0000 g | Freq: Once | INTRAVENOUS | Status: AC
  Administered 2017-01-01: 2 g via INTRAVENOUS
  Filled 2017-01-01: qty 50

## 2017-01-01 MED ORDER — PREDNISONE 20 MG PO TABS
30.0000 mg | ORAL_TABLET | Freq: Every day | ORAL | Status: DC
  Administered 2017-01-02: 30 mg via ORAL
  Filled 2017-01-01: qty 1

## 2017-01-01 MED ORDER — DEXTROSE 5 % IV SOLN
1.0000 g | INTRAVENOUS | Status: DC
  Administered 2017-01-01: 1 g via INTRAVENOUS
  Filled 2017-01-01 (×2): qty 10

## 2017-01-01 NOTE — Progress Notes (Signed)
Speech Therapy Note: reviewed chart notes; consulted NSG and met w/ pt and Daughter. Pt/Daughter denied any swallowing issues stating he was eating/drinking well - no reports of overt s/s of aspiration (coughing, throat clearing) w/ oral intake by pt/staff. Recent CXR revealed no apparent decline. Pt stated he would like a "BLT" stating he could "chew it just fine". SLP will modify the diet consistency to regular; Daughter to monitor as needed. NSG and pt/family will reconsult ST services if any need for further assessment or education. ST will sign off at this time.   Orinda Kenner, [place], CCC-SLP

## 2017-01-01 NOTE — Progress Notes (Signed)
[facility] Cardiology  SUBJECTIVE: Patient denies chest pain, palpitations, worsening shortness of breath, or lightheadedness. 25 beats of idioventricular rhythm at a rate of about 100 bpm noted on telemetry.    Vitals:   01/01/17 0450 01/01/17 0722 01/01/17 0737 01/01/17 0739  BP: (!) 110/54  128/60   Pulse: 73  68 70  Resp: 16  16   Temp: 98.1 F (36.7 C)   98 F (36.7 C)  TempSrc: Oral   Oral  SpO2: 94% 94% 93% 90%  Weight: 87.1 kg (192 lb)     Height:         Intake/Output Summary (Last 24 hours) at 01/01/17 1327 Last data filed at 01/01/17 0957  Gross per 24 hour  Intake              480 ml  Output              845 ml  Net             -365 ml      PHYSICAL EXAM  General: Well developed, in no acute distress HEENT:  Normocephalic and atramatic Neck:  No JVD.  Lungs: Normal effort of breathing. No wheezing, rales.  Heart: HRRR . Normal S1 and S2 without gallops or murmurs.  Abdomen: Bowel sounds are positive, abdomen soft and non-tender  Msk:  Back normal, sitting upright in chair Extremities: No clubbing, cyanosis or edema.   Neuro: Alert and oriented X 3. Psych:  Good affect, responds appropriately   LABS: Basic Metabolic Panel:  Recent Labs  12/30/16 0420 12/31/16 0500 01/01/17 0520  NA 139 141 139  K 4.1 3.9 4.1  CL 95* 96* 96*  CO2 35* 38* 39*  GLUCOSE 167* 114* 106*  BUN 29* 39* 34*  CREATININE 0.94 0.86 0.93  CALCIUM 8.7* 8.7* 8.6*  MG 2.3  --   --    Liver Function Tests: No results for input(s): AST, ALT, ALKPHOS, BILITOT, PROT, ALBUMIN in the last 72 hours. No results for input(s): LIPASE, AMYLASE in the last 72 hours. CBC:  Recent Labs  12/30/16 0420 12/31/16 0500 01/01/17 0520  WBC 6.8 10.1 8.4  NEUTROABS 6.4  --  6.7*  HGB 14.2 13.8 14.0  HCT 43.1 41.7 42.6  MCV 89.5 89.3 91.0  PLT 207 231 227   Cardiac Enzymes: No results for input(s): CKTOTAL, CKMB, CKMBINDEX, TROPONINI in the last 72 hours. BNP: Invalid input(s):  POCBNP D-Dimer: No results for input(s): DDIMER in the last 72 hours. Hemoglobin A1C: No results for input(s): HGBA1C in the last 72 hours. Fasting Lipid Panel: No results for input(s): CHOL, HDL, LDLCALC, TRIG, CHOLHDL, LDLDIRECT in the last 72 hours. Thyroid Function Tests: No results for input(s): TSH, T4TOTAL, T3FREE, THYROIDAB in the last 72 hours.  Invalid input(s): FREET3 Anemia Panel: No results for input(s): VITAMINB12, FOLATE, FERRITIN, TIBC, IRON, RETICCTPCT in the last 72 hours.  No results found.   Echo: 12/22/16 EF 50%, moderate LVH  TELEMETRY: Sinus rhythm, rate 81 bpm  ASSESSMENT AND PLAN:  Active Problems:   Acute respiratory failure ([place])    1. Idioventricular rhythm at a rate of 408 bpm of uncertain clinical significance without hemodynamic instabliity in asymptomatic patient with normal LV function 2. Acute hypoxic respiratory failure secondary to healthcare associated pneumonia, clinically improving  Recommendations: 1. Defer antiarrhythmic at this time 2. Continue to observe 3. No further cardiac diagnostics at this time.  Clabe Seal, PA-C 01/01/2017 1:27 PM

## 2017-01-01 NOTE — Progress Notes (Signed)
Dr. Josefa Half paged to make aware of 25 beats of v. Tach on tele/ pt asymptomatic/ MD stated to monitor pt . Closely/ no further orders at this time

## 2017-01-01 NOTE — Progress Notes (Signed)
Patient ID: [name], male   DOB: 20-Jun-1926, 83 y.o.   MRN: [id number]  [employer] PROGRESS NOTE  BAO BAZEN XBL:[id number] DOB: 1926/01/13 DOA: 12/27/2016 PCP: [name], MD  HPI/Subjective: Patient feeling better. Still with a lot of nasal congestion and some cough. Cough is now clear. Hoping to go home soon.  Objective: Vitals:   01/01/17 0737 01/01/17 0739  BP: 128/60   Pulse: 68 70  Resp: 16   Temp:  98 F (36.7 C)    Filed Weights   12/30/16 0448 12/31/16 0411 01/01/17 0450  Weight: 84.4 kg (186 lb 1.1 oz) 86.4 kg (190 lb 7.6 oz) 87.1 kg (192 lb)    ROS: Review of Systems  Constitutional: Negative for chills and fever.  HENT: Positive for congestion.   Eyes: Negative for blurred vision.  Respiratory: Positive for cough. Negative for shortness of breath.   Cardiovascular: Negative for chest pain.  Gastrointestinal: Negative for abdominal pain, constipation, diarrhea, nausea and vomiting.  Genitourinary: Negative for dysuria.  Musculoskeletal: Negative for joint pain.  Neurological: Negative for dizziness and headaches.   Exam: Physical Exam  Constitutional: He is oriented to person, place, and time.  HENT:  Nose: No mucosal edema.  Mouth/Throat: No oropharyngeal exudate or posterior oropharyngeal edema.  Eyes: Conjunctivae, EOM and lids are normal. Pupils are equal, round, and reactive to light.  Neck: No JVD present. Carotid bruit is not present. No edema present. No thyroid mass and no thyromegaly present.  Cardiovascular: S1 normal and S2 normal.  Exam reveals no gallop.   No murmur heard. Pulses:      Dorsalis pedis pulses are 2+ on the right side, and 2+ on the left side.  Respiratory: No respiratory distress. He has decreased breath sounds in the right lower field and the left lower field. He has no wheezes. He has no rhonchi. He has no rales.  GI: Soft. Bowel sounds are normal. There is no tenderness.  Musculoskeletal:       Right  ankle: He exhibits swelling.       Left ankle: He exhibits swelling.  Lymphadenopathy:    He has no cervical adenopathy.  Neurological: He is alert and oriented to person, place, and time. No cranial nerve deficit.  Skin: Skin is warm. No rash noted. Nails show no clubbing.  Psychiatric: He has a normal mood and affect.      Data Reviewed: Basic Metabolic Panel:  Recent Labs Lab 12/28/16 0559 12/29/16 0510 12/30/16 0420 12/31/16 0500 01/01/17 0520  NA 137 141 139 141 139  K 4.4 4.0 4.1 3.9 4.1  CL 101 99* 95* 96* 96*  CO2 29 33* 35* 38* 39*  GLUCOSE 152* 105* 167* 114* 106*  BUN 28* 27* 29* 39* 34*  CREATININE 1.01 1.00 0.94 0.86 0.93  CALCIUM 8.3* 8.5* 8.7* 8.7* 8.6*  MG  --   --  2.3  --   --    Liver Function Tests:  Recent Labs Lab 12/27/16 1053  AST 27  ALT 22  ALKPHOS 54  BILITOT 0.8  PROT 7.0  ALBUMIN 3.9   CBC:  Recent Labs Lab 12/27/16 1053 12/28/16 0559 12/29/16 0510 12/30/16 0420 12/31/16 0500 01/01/17 0520  WBC 7.2 10.5 8.0 6.8 10.1 8.4  NEUTROABS 6.1 8.8*  --  6.4  --  6.7*  HGB 13.4 12.5* 13.0 14.2 13.8 14.0  HCT 41.5 38.4* 40.5 43.1 41.7 42.6  MCV 92.4 90.0 92.2 89.5 89.3 91.0  PLT 157 190  175 207 231 227   Cardiac Enzymes:  Recent Labs Lab 12/27/16 1053 12/27/16 1510 12/27/16 2042 12/28/16 0559 12/29/16 0510  TROPONINI 0.56* 0.56* 0.26* 0.55* 0.43*   BNP (last 3 results)  Recent Labs  12/27/16 1053  BNP 301.0*     CBG:  Recent Labs Lab 12/31/16 2104 01/01/17 0024 01/01/17 0413 01/01/17 0907 01/01/17 1159  GLUCAP 78 105* 94 172* 143*    Recent Results (from the past 240 hour(s))  Culture, blood (routine x 2)     Status: None   Collection Time: 12/27/16 10:53 AM  Result Value Ref Range Status   Specimen Description BLOOD RIGHT FOREARM  Final   Special Requests BOTTLES DRAWN AEROBIC AND ANAEROBIC BCAV  Final   Culture NO GROWTH 5 DAYS  Final   Report Status 01/01/2017 FINAL  Final  Culture, blood (routine  x 2)     Status: None   Collection Time: 12/27/16 10:53 AM  Result Value Ref Range Status   Specimen Description BLOOD RIGHT ARM  Final   Special Requests BOTTLES DRAWN AEROBIC AND ANAEROBIC BCHV  Final   Culture NO GROWTH 5 DAYS  Final   Report Status 01/01/2017 FINAL  Final  MRSA PCR Screening     Status: None   Collection Time: 12/27/16  4:30 PM  Result Value Ref Range Status   MRSA by PCR NEGATIVE NEGATIVE Final    Comment:        The GeneXpert MRSA Assay (FDA approved for NASAL specimens only), is one component of a comprehensive MRSA colonization surveillance program. It is not intended to diagnose MRSA infection nor to guide or monitor treatment for MRSA infections.   Culture, expectorated sputum-assessment     Status: None   Collection Time: 12/29/16  6:39 AM  Result Value Ref Range Status   Specimen Description EXPECTORATED SPUTUM  Final   Special Requests NONE  Final   Sputum evaluation   Final    Sputum specimen not acceptable for testing.  Please recollect.     Report Status 12/29/2016 FINAL  Final  Culture, expectorated sputum-assessment     Status: None   Collection Time: 12/29/16  1:55 PM  Result Value Ref Range Status   Specimen Description EXPECTORATED SPUTUM  Final   Special Requests NONE  Final   Sputum evaluation   Final    Sputum specimen not acceptable for testing.  Please recollect.   SPOKE TO NICK PEARSE 12/29/16 @ 1502  MLK    Report Status 12/29/2016 FINAL  Final  Culture, expectorated sputum-assessment     Status: None   Collection Time: 12/29/16  5:46 PM  Result Value Ref Range Status   Specimen Description EXPECTORATED SPUTUM  Final   Special Requests NONE  Final   Sputum evaluation THIS SPECIMEN IS ACCEPTABLE FOR SPUTUM CULTURE  Final   Report Status 12/30/2016 FINAL  Final  Culture, respiratory (NON-Expectorated)     Status: None   Collection Time: 12/29/16  5:46 PM  Result Value Ref Range Status   Specimen Description EXPECTORATED SPUTUM   Final   Special Requests NONE Reflexed from [id number]  Final   Gram Stain   Final    RARE WBC PRESENT,BOTH PMN AND MONONUCLEAR RARE GRAM POSITIVE COCCI IN PAIRS    Culture   Final    Consistent with normal respiratory flora. Performed at [facility], [address] [place], [place] [address]    Report Status 01/01/2017 FINAL  Final  MRSA PCR Screening  Status: None   Collection Time: 12/31/16 10:12 AM  Result Value Ref Range Status   MRSA by PCR NEGATIVE NEGATIVE Final    Comment:        The GeneXpert MRSA Assay (FDA approved for NASAL specimens only), is one component of a comprehensive MRSA colonization surveillance program. It is not intended to diagnose MRSA infection nor to guide or monitor treatment for MRSA infections.       Scheduled Meds: . cefTRIAXone (ROCEPHIN)  IV  1 g Intravenous Q24H  . chlorhexidine gluconate (MEDLINE KIT)  15 mL Mouth Rinse BID  . enoxaparin (LOVENOX) injection  40 mg Subcutaneous Q24H  . insulin aspart  0-15 Units Subcutaneous Q4H  . ipratropium-albuterol  3 mL Nebulization Q6H  . latanoprost  1 drop Right Eye QHS  . mycophenolate  1,000 mg Oral BID  . pravastatin  20 mg Oral Daily  . [START ON 01/02/2017] predniSONE  30 mg Oral Q breakfast  . pyridostigmine  30 mg Oral 6 X Daily    Assessment/Plan:  1. Acute hypoxic respiratory failure. Patient now on room air at rest. Will check pulse ox with ambulation tomorrow morning. 2. Healthcare associated pneumonia. Patient was initially on aggressive antibiotics which over to Rocephin and Zithromax by pulmonology. We'll give 2 more days of Rocephin. Taper steroids to prednisone daily. 3. Elevated troponin secondary to acute hypoxic respiratory failure 4. History of myasthenia gravis and Eaton-Lambert syndrome. Patient on CellCept and pyridostigmine 5. Hyperlipidemia unspecified on pravastatin 6. Glock, unspecified on latanoprost 7. Called with 25 beats of ventricular tachycardia. I  gave IV magnesium. Last echocardiogram showed an EF of 50%. Appreciate cardiology reconsult.  Code Status:     Code Status Orders        Start     Ordered   12/27/16 1510  Full code  Continuous     12/27/16 1511    Code Status History    Date Active Date Inactive Code Status Order ID Comments User Context   This patient has a current code status but no historical code status.     Family Communication: Daughter [name] at the bedside Disposition Plan: Potentially home tomorrow  Consultants:  Critical care specialist  Cardiology  Antibiotics:  Rocephin  Time spent: 28 minutes  [place], [place]

## 2017-01-01 NOTE — Progress Notes (Signed)
Dr. Earleen Newport made aware of 25 beats of v.tach / pt asymptomatic

## 2017-01-01 NOTE — Progress Notes (Signed)
qPhysical Therapy Treatment Patient Details Name: [name] MRN: [id number] DOB: 09/03/1926 Today's Date: 01/01/2017    History of Present Illness Patient is an 83 yo M with a chronic history of myasthenia gravis and admitted to intensive care for acute hypoxic respiratory failure secondary to healthcare associated pneumonia. Intubated and extubated on April 1. Still wheezing secondary to pneumonitis and volume overload.    PT Comments    Pt with varied O2 sats on room air at rest 87-94% with cues for proper breathing.  Participated in exercises as described below.  After standing exercises Sats 94%.  Pt was able to ambulate 4' with walker and min guard.  Sats 86% on room air.  Pt sat and O2 support at 2 lpm was given to complete ambulation around nursing unit.  Sats remained in mid 90's during gait.  Returned to low 90's at rest once O2 was removed.    Pt overall does well but continues to need O2 support for mobility skills to maintain O2 sats.  Discussed with nursing and care manager.     Follow Up Recommendations  Home health PT;Supervision for mobility/OOB     Equipment Recommendations  Rolling walker with 5" wheels    Recommendations for Other Services       Precautions / Restrictions Precautions Precautions: Fall Restrictions Weight Bearing Restrictions: No    Mobility  Bed Mobility                  Transfers Overall transfer level: Needs assistance Equipment used: Rolling walker (2 wheeled) Transfers: Sit to/from Stand Sit to Stand: Min guard            Ambulation/Gait Ambulation/Gait assistance: Min guard Ambulation Distance (Feet): 200 Feet Assistive device: Rolling walker (2 wheeled) Gait Pattern/deviations: Step-through pattern   Gait velocity interpretation: Below normal speed for age/gender General Gait Details: 61' on room air , 140' with O2 at 2 lpm   Stairs            Wheelchair Mobility    Modified Rankin (Stroke  Patients Only)       Balance Overall balance assessment: Needs assistance Sitting-balance support: Feet supported Sitting balance-Leahy Scale: Good     Standing balance support: Bilateral upper extremity supported Standing balance-Leahy Scale: Fair                              Cognition Arousal/Alertness: Awake/alert Behavior During Therapy: WFL for tasks assessed/performed Overall Cognitive Status: Within Functional Limits for tasks assessed                                        Exercises Other Exercises Other Exercises: standing exercises with walker for toe raises, marches, and SLR x 10    General Comments        Pertinent Vitals/Pain Pain Assessment: No/denies pain    Home Living                      Prior Function            PT Goals (current goals can now be found in the care plan section) Progress towards PT goals: Progressing toward goals    Frequency    Min 2X/week      PT Plan Current plan remains appropriate    Co-evaluation  End of Session Equipment Utilized During Treatment: Gait belt;Oxygen Activity Tolerance: Patient tolerated treatment well Patient left: in chair;with chair alarm set;with call bell/phone within reach;with family/visitor present         Time: 9741-6384 PT Time Calculation (min) (ACUTE ONLY): 23 min  Charges:  $Gait Training: 8-22 mins $Therapeutic Exercise: 8-22 mins                    G Codes:       Chesley Noon, PTA 01/01/17, 11:53 AM

## 2017-01-01 NOTE — Progress Notes (Signed)
SATURATION QUALIFICATIONS: (This note is used to comply with regulatory documentation for home oxygen)  Patient Saturations on Room Air at Rest = 92%  Patient Saturations on Room Air while Ambulating =86%  Patient Saturations on 2 Liters of oxygen while Ambulating = 91%  Please briefly explain why patient needs home oxygen:

## 2017-01-01 NOTE — Care Management (Signed)
Discussed the need for home 02 assessment during progression.  Spoke with patient's daughter about anticipated need for home 02 and home health.  Provided her with written list of agency for agency choice

## 2017-01-02 LAB — GLUCOSE, CAPILLARY
GLUCOSE-CAPILLARY: 98 mg/dL (ref 65–99)
Glucose-Capillary: 102 mg/dL — ABNORMAL HIGH (ref 65–99)
Glucose-Capillary: 89 mg/dL (ref 65–99)

## 2017-01-02 LAB — POTASSIUM: Potassium: 4.1 mmol/L (ref 3.5–5.1)

## 2017-01-02 LAB — MAGNESIUM: Magnesium: 2.6 mg/dL — ABNORMAL HIGH (ref 1.7–2.4)

## 2017-01-02 MED ORDER — PREDNISONE 5 MG PO TABS: ORAL_TABLET | ORAL | 0 refills | Status: DC

## 2017-01-02 MED ORDER — IPRATROPIUM BROMIDE 0.06 % NA SOLN: 2.0000 | Freq: Two times a day (BID) | NASAL | 0 refills | Status: DC

## 2017-01-02 MED ORDER — TRAZODONE HCL 50 MG PO TABS: 50.0000 mg | ORAL_TABLET | Freq: Every evening | ORAL | 0 refills | Status: DC | PRN

## 2017-01-02 MED ORDER — AMOXICILLIN-POT CLAVULANATE 875-125 MG PO TABS: 1.0000 | ORAL_TABLET | Freq: Two times a day (BID) | ORAL | 0 refills | Status: DC

## 2017-01-02 MED ORDER — ALBUTEROL SULFATE HFA 108 (90 BASE) MCG/ACT IN AERS: 2.0000 | INHALATION_SPRAY | Freq: Four times a day (QID) | RESPIRATORY_TRACT | 0 refills | Status: DC | PRN

## 2017-01-02 MED ORDER — IPRATROPIUM BROMIDE 0.06 % NA SOLN
2.0000 | Freq: Two times a day (BID) | NASAL | Status: DC
  Filled 2017-01-02: qty 15

## 2017-01-02 MED ORDER — AMOXICILLIN-POT CLAVULANATE 875-125 MG PO TABS: 1.0000 | ORAL_TABLET | Freq: Two times a day (BID) | ORAL | Status: DC

## 2017-01-02 MED ORDER — TRAZODONE HCL 50 MG PO TABS: 50.0000 mg | ORAL_TABLET | Freq: Every evening | ORAL | Status: DC | PRN

## 2017-01-02 NOTE — Progress Notes (Signed)
Dr. Leslye Peer aware of runs of SVT per CCMD. Patient asymptomatic, eating breakfast. No new orders.

## 2017-01-02 NOTE — Progress Notes (Signed)
SATURATION QUALIFICATIONS: (This note is used to comply with regulatory documentation for home oxygen)  Patient Saturations on Room Air at Rest = 96%  Patient Saturations on Room Air while Ambulating = 92%  Patient Saturations on n/a Liters of oxygen while Ambulating = n/a%  Please briefly explain why patient needs home oxygen: not needed

## 2017-01-02 NOTE — Discharge Summary (Signed)
[place] [facility] [place] NAME: [name]    MR#:  [id number]  DATE OF BIRTH:  04/16/1926  DATE OF ADMISSION:  12/27/2016 ADMITTING PHYSICIAN: Wilhelmina Mcardle, MD  DATE OF DISCHARGE: 01/02/2017 11:21 AM  PRIMARY CARE PHYSICIAN: [name], MD    ADMISSION DIAGNOSIS:  Acute pulmonary edema ([place]) [J81.0] Hypoxia [R09.02] Troponin I above reference range [R74.8] Bronchospasm, acute [J98.01] Thoracic aortic aneurysm without rupture ([place]) [I71.2] Community acquired pneumonia of left lower lobe of lung ([place]) [J18.1]  DISCHARGE DIAGNOSIS:  Active Problems:   Acute respiratory failure ([place])   SECONDARY DIAGNOSIS:   Past Medical History:  Diagnosis Date  . Allergy   . Anemia   . 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  . Chicken pox   . Colon polyp   . 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 Essential hypertension, benign   . Hypercholesterolemia   . Myasthenia gravis ([place])   . Shingles 2013    HOSPITAL COURSE:   1. Acute hypoxic respiratory failure. Patient was admitted on 12/27/16 by the critical care specialist. Patient was intubated at that time. Patient was extubated by the critical care service and transferred to the medicine service for follow-up on 12/31/2016. The patient was on nasal cannula at that time and doing better. On 01/02/2017 the patient was tapered off nasal cannula and maintain his saturations with ambulation. Patient is not a candidate for oxygen at home. 2. Pneumonia. The patient was initially started on aggressive antibiotics. Switched over to Rocephin and Zithromax. Since the patient likely also has a sinusitis I switched over to Augmentin for a longer course. Albuterol inhaler prescribed. Ipratropium nasal spray prescribed. Quick prednisone taper prescribed. 3. Elevated troponin secondary to acute hypoxic respiratory failure 4. History of myasthenia gravis and Eaton-Lambert syndrome. Patient on  CellCept and pyridostigmine 5. Hyperlipidemia unspecified on pravastatin 6. Glaucoma unspecified on latanoprost 7. Idioventricular rhythm 25 beats 2 days ago. Seen again by cardiology and no further workup. I did give IV magnesium  DISCHARGE CONDITIONS:   Satisfactory  CONSULTS OBTAINED:  Treatment Team:  Isaias Cowman, MD Leotis Pain, MD  DRUG ALLERGIES:   Allergies  Allergen Reactions  . 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 Ciprofloxacin Other (See Comments)    Last resort due to [place].   . Sulfa Antibiotics Rash    DISCHARGE MEDICATIONS:   Discharge Medication List as of 01/02/2017 10:44 AM    START taking these medications   Details  albuterol (PROVENTIL HFA;VENTOLIN HFA) 108 (90 Base) MCG/ACT inhaler Inhale 2 puffs into the lungs every 6 (six) hours as needed for wheezing or shortness of breath., Starting Fri 01/02/2017, Print    amoxicillin-clavulanate (AUGMENTIN) 875-125 MG tablet Take 1 tablet by mouth every 12 (twelve) hours., Starting Fri 01/02/2017, Print    ipratropium (ATROVENT) 0.06 % nasal spray Place 2 sprays into both nostrils 2 (two) times daily., Starting Fri 01/02/2017, Print    predniSONE (DELTASONE) 5 MG tablet 4 tabs po day1; 3 tabs po day2; 2 tabs po day3; 1 tab po day4, Print    traZODone (DESYREL) 50 MG tablet Take 1 tablet (50 mg total) by mouth at bedtime as needed for sleep., Starting Fri 01/02/2017, Print  CONTINUE these medications which have NOT CHANGED   Details  aspirin 81 MG tablet Take 81 mg by mouth daily., Historical Med    bimatoprost (LUMIGAN) 0.01 % SOLN Place 1 drop into the right eye every other day. , Historical Med    finasteride (PROSCAR) 5 MG tablet Take 5 mg  by mouth daily., Historical Med    furosemide (LASIX) 20 MG tablet Take 20 mg by mouth daily., Historical Med    isosorbide mononitrate (IMDUR) 30 MG 24 hr tablet Take 30 mg by mouth daily., Historical Med    lisinopril (PRINIVIL,ZESTRIL) 5 MG tablet Take 5 mg by mouth daily., Historical Med    mycophenolate (CELLCEPT) 250 MG capsule Take 1,000 mg by mouth 2 (two) times daily. , Historical Med    nystatin cream (MYCOSTATIN) Apply 1 application topically 2 (two) times daily., Starting Fri 09/14/2015, Normal    pravastatin (PRAVACHOL) 20 MG tablet Take 1 tablet (20 mg total) by mouth daily., Starting Thu 07/27/2014, Normal    pyridostigmine (MESTINON) 60 MG tablet Take 30 mg by mouth 6 (six) times daily. , Historical Med    tamsulosin (FLOMAX) 0.4 MG CAPS Take 0.4 mg by mouth daily., Historical Med      STOP taking these medications     dextromethorphan (DELSYM) 30 MG/5ML liquid          DISCHARGE INSTRUCTIONS:   Follow-up PMD one week  If you experience worsening of your admission symptoms, develop shortness of breath, life threatening emergency, suicidal or homicidal thoughts you must seek medical attention immediately by calling 911 or calling your MD immediately  if symptoms less severe.  You Must read complete instructions/literature along with all the possible adverse reactions/side effects for all the Medicines you take and that have been prescribed to you. Take any new Medicines after you have completely understood and accept all the possible adverse reactions/side effects.   Please note  You were cared for by a hospitalist during your hospital stay. If you have any questions about your discharge medications or the care you received while you were in the hospital after you are discharged, you can call the unit and asked to speak with the hospitalist on call if the hospitalist that took care of you is not available. Once you are discharged, your primary care physician will  handle any further medical issues. Please note that NO REFILLS for any discharge medications will be authorized once you are discharged, as it is imperative that you return to your primary care physician (or establish a relationship with a primary care physician if you do not have one) for your aftercare needs so that they can reassess your need for medications and monitor your lab values.    Today   CHIEF COMPLAINT:   Chief Complaint  Patient presents with  . Shortness of Breath  . Cough    HISTORY OF PRESENT ILLNESS:  Enos Muhl  is a 83 y.o. male presented with shortness of breath cough and hypoxia   VITAL SIGNS:  Blood pressure (!) 141/60, pulse 78, temperature 97.5 F (36.4 C), resp. rate 18, height 5\' 11"  (1.803 m), weight 87.6 kg (193 lb 1.6 oz), SpO2 92 %.    PHYSICAL EXAMINATION:  GENERAL:  83 y.o.-year-old patient lying in the bed with no acute distress.  EYES: Pupils equal, round, reactive to light and accommodation. No scleral icterus. Extraocular muscles intact.  HEENT: Head atraumatic, normocephalic. Oropharynx and nasopharynx clear.  NECK:  Supple, no jugular venous distention.  No thyroid enlargement, no tenderness.  LUNGS: Decreased breath sounds bilaterally, no wheezing, rales,rhonchi or crepitation. No use of accessory muscles of respiration.  CARDIOVASCULAR: S1, S2 normal. No murmurs, rubs, or gallops.  ABDOMEN: Soft, non-tender, non-distended. Bowel sounds present. No organomegaly or mass.  EXTREMITIES: No pedal edema, cyanosis, or clubbing.  NEUROLOGIC: Cranial nerves II through XII are intact. Muscle strength 5/5 in all extremities. Sensation intact. Gait not checked.  PSYCHIATRIC: The patient is alert and oriented x 3.  SKIN: No obvious rash, lesion, or ulcer.   DATA REVIEW:   CBC  Recent Labs Lab 01/01/17 0520  WBC 8.4  HGB 14.0  HCT 42.6  PLT 227    Chemistries   Recent Labs Lab 12/27/16 1053  01/01/17 0520 01/02/17 0339  NA 136  <  > 139  --   K 4.5  < > 4.1 4.1  CL 98*  < > 96*  --   CO2 32  < > 39*  --   GLUCOSE 138*  < > 106*  --   BUN 27*  < > 34*  --   CREATININE 1.11  < > 0.93  --   CALCIUM 8.7*  < > 8.6*  --   MG  --   < >  --  2.6*  AST 27  --   --   --   ALT 22  --   --   --   ALKPHOS 54  --   --   --   BILITOT 0.8  --   --   --   < > = values in this interval not displayed.  Cardiac Enzymes  Recent Labs Lab 12/29/16 0510  TROPONINI 0.43*    Microbiology Results  Results for orders placed or performed during the hospital encounter of 12/27/16  Culture, blood (routine x 2)     Status: None   Collection Time: 12/27/16 10:53 AM  Result Value Ref Range Status   Specimen Description BLOOD RIGHT FOREARM  Final   Special Requests BOTTLES DRAWN AEROBIC AND ANAEROBIC BCAV  Final   Culture NO GROWTH 5 DAYS  Final   Report Status 01/01/2017 FINAL  Final  Culture, blood (routine x 2)     Status: None   Collection Time: 12/27/16 10:53 AM  Result Value Ref Range Status   Specimen Description BLOOD RIGHT ARM  Final   Special Requests BOTTLES DRAWN AEROBIC AND ANAEROBIC [place]  Final   Culture NO GROWTH 5 DAYS  Final   Report Status 01/01/2017 FINAL  Final  MRSA PCR Screening     Status: None   Collection Time: 12/27/16  4:30 PM  Result Value Ref Range Status   MRSA by PCR NEGATIVE NEGATIVE Final    Comment:        The GeneXpert MRSA Assay (FDA approved for NASAL specimens only), is one component of a comprehensive MRSA colonization surveillance program. It is not intended to diagnose MRSA infection nor to guide or monitor treatment for MRSA infections.   Culture, expectorated sputum-assessment     Status: None   Collection Time: 12/29/16  6:39 AM  Result Value Ref Range Status   Specimen Description EXPECTORATED SPUTUM  Final   Special Requests NONE  Final   Sputum evaluation   Final    Sputum specimen not acceptable for testing.  Please recollect.     Report Status 12/29/2016 FINAL  Final   Culture, expectorated sputum-assessment     Status: None   Collection  Time: 12/29/16  1:55 PM  Result Value Ref Range Status   Specimen Description EXPECTORATED SPUTUM  Final   Special Requests NONE  Final   Sputum evaluation   Final    Sputum specimen not acceptable for testing.  Please recollect.   SPOKE TO NICK PEARSE 12/29/16 @ 1502  [place]    Report Status 12/29/2016 FINAL  Final  Culture, expectorated sputum-assessment     Status: None   Collection Time: 12/29/16  5:46 PM  Result Value Ref Range Status   Specimen Description EXPECTORATED SPUTUM  Final   Special Requests NONE  Final   Sputum evaluation THIS SPECIMEN IS ACCEPTABLE FOR SPUTUM CULTURE  Final   Report Status 12/30/2016 FINAL  Final  Culture, respiratory (NON-Expectorated)     Status: None   Collection Time: 12/29/16  5:46 PM  Result Value Ref Range Status   Specimen Description EXPECTORATED SPUTUM  Final   Special Requests NONE Reflexed from [id number]  Final   Gram Stain   Final    RARE WBC PRESENT,BOTH PMN AND MONONUCLEAR RARE GRAM POSITIVE COCCI IN PAIRS    Culture   Final    Consistent with normal respiratory flora. Performed at [facility], [place] [address], [place], [place] [address]    Report Status 01/01/2017 FINAL  Final  MRSA PCR Screening     Status: None   Collection Time: 12/31/16 10:12 AM  Result Value Ref Range Status   MRSA by PCR NEGATIVE NEGATIVE Final    Comment:        The GeneXpert MRSA Assay (FDA approved for NASAL specimens only), is one component of a comprehensive MRSA colonization surveillance program. It is not intended to diagnose MRSA infection nor to guide or monitor treatment for MRSA infections.     Management plans discussed with the patient, family and they are in agreement.  CODE STATUS:  Code Status History    Date Active Date Inactive Code Status Order ID Comments User Context   12/27/2016  3:12 PM 01/02/2017  2:27 PM Full Code [id number]  Wilhelmina Mcardle, MD ED       TOTAL TIME TAKING CARE OF THIS PATIENT: 35 minutes.    Loletha Grayer M.D on 01/02/2017 at 5:55 PM  Between 7am to 6pm - Pager - [phone]  After 6pm go to [email or web address] - password [email or web address] [facility]  [phone]  CC: Primary care physician; [name], MD

## 2017-01-02 NOTE — Progress Notes (Signed)
Patient given discharge teaching and paperwork regarding medications, diet, follow-up appointments and activity. Patient understanding verbalized. No complaints at this time. IV and telemetry discontinued prior to leaving. Skin assessment as previously charted and vitals are stable; on room air - did not qualify for home O. Patient being discharged to home. Caregiver/family present during discharge teaching. No further needs by Care Management -patient refused home health. Prescriptions handed to pateint

## 2017-01-02 NOTE — Care Management (Signed)
Attending informed caremanager that today patient did not qualify for home 02 and patient/family very happy.  Also informed that family and patient declined home health nurse and physical therapy.

## 2017-01-05 ENCOUNTER — Telehealth: Payer: Self-pay | Admitting: *Deleted

## 2017-01-05 NOTE — Telephone Encounter (Signed)
Need HFU patient transportation is limited to Friday and Thursdays , please advise.

## 2017-01-05 NOTE — Telephone Encounter (Addendum)
Pt discharged from [facility] on 01/02/17 Pt will need a HFU scheduled. Thursdays or Fridays are best for daughter . Please contact [name] [phone]

## 2017-01-06 LAB — BLOOD GAS, ARTERIAL
Acid-Base Excess: 2.5 mmol/L — ABNORMAL HIGH (ref 0.0–2.0)
Bicarbonate: 30.2 mmol/L — ABNORMAL HIGH (ref 20.0–28.0)
FIO2: 0.5
LHR: 16 {breaths}/min
MECHVT: 500 mL
O2 SAT: 95.8 %
PATIENT TEMPERATURE: 37
PCO2 ART: 60 mmHg — AB (ref 32.0–48.0)
PEEP/CPAP: 5 cmH2O
PH ART: 7.31 — AB (ref 7.350–7.450)
PO2 ART: 88 mmHg (ref 83.0–108.0)

## 2017-01-06 NOTE — Telephone Encounter (Signed)
I can see him at 3:30 on 01/15/17.  I assume this will not be a TCM since discharged 01/02/17.  Just let me know.  Thanks

## 2017-01-06 NOTE — Telephone Encounter (Signed)
This is a TCM due to D/C on Friday I have Monday and Tuesday to reach and schedule patient.

## 2017-01-06 NOTE — Telephone Encounter (Signed)
Pt daughter called back looking for a status update. Daughter is available until 10 am and then from 12-1. Please advise, thank you!  Call Lisa @ [phone] Call pt @ [phone].

## 2017-01-06 NOTE — Telephone Encounter (Signed)
Transition Care Management Follow-up Telephone Call  How have you been since you were released from the hospital? Patient stated she feels much better but still has congestion.   Do you understand why you were in the hospital? Weakness and Dizziness due to Hypoxia. On arrival to ER patient 02 sat @ 40% on room air.   Do you understand the discharge instrcutions? Yes  Items Reviewed:  Medications reviewed:yes  Allergies reviewed: Yes  Dietary changes reviewed: Yes, regular diet.  Referrals reviewed: Yes,    Functional Questionnaire:   Activities of Daily Living (ADLs):   He states they are independent in the following:   Patient is independent in all ADL's States they require assistance with the following:   No assist at this time.   Any transportation issues/concerns?: No   Any patient concerns? NO    Confirmed importance and date/time of follow-up visits scheduled: Yes   Confirmed with patient if condition begins to worsen call PCP or go to the ER.  Patient was given the Call-a-Nurse line [phone]: Yes

## 2017-01-06 NOTE — Telephone Encounter (Signed)
Ok.  appt time given in previous message.  Let me know if I need to do anything.

## 2017-01-15 ENCOUNTER — Encounter: Payer: Self-pay | Admitting: Internal Medicine

## 2017-01-15 ENCOUNTER — Ambulatory Visit (INDEPENDENT_AMBULATORY_CARE_PROVIDER_SITE_OTHER): Payer: Medicare Other | Admitting: Internal Medicine

## 2017-01-15 DIAGNOSIS — J9601 Acute respiratory failure with hypoxia: Secondary | ICD-10-CM | POA: Diagnosis not present

## 2017-01-15 DIAGNOSIS — C679 Malignant neoplasm of bladder, unspecified: Secondary | ICD-10-CM | POA: Diagnosis not present

## 2017-01-15 DIAGNOSIS — G7080 Lambert-Eaton syndrome, unspecified: Secondary | ICD-10-CM

## 2017-01-15 DIAGNOSIS — I2510 Atherosclerotic heart disease of native coronary artery without angina pectoris: Secondary | ICD-10-CM

## 2017-01-15 NOTE — Progress Notes (Signed)
Pre-visit discussion using our clinic review tool. No additional management support is needed unless otherwise documented below in the visit note.  

## 2017-01-15 NOTE — Progress Notes (Signed)
Patient ID: [name], male   DOB: Jun 20, 1926, 83 y.o.   MRN: [id number]   Subjective:    Patient ID: [name], male    DOB: Feb 06, 1926, 83 y.o.   MRN: [id number]  HPI  Patient here for hospital follow up.  He is accompanied by his wife and daughter.  History obtained from all three of them.  He was admitted with acute hypoxic respiratory failure.  Was intubated.  Was able to be extubated and weaned off oxygen.  Was found to have pneumonia.  Treated with abx.  Also placed on prednisone taper.  Found to have elevated troponin.  This was felt to be related to his hypoxic respiratory failure.  Cardiology felt no further w/up warranted.  Since being home he feels better.  Breathing much better.  Eating.  No nausea or vomiting.  Bowels stable.     Past Medical History:  Diagnosis Date  . Allergy   . Anemia   . Cancer ([place]) 11-24-12   bladder. BCG treatments by Dr Jacqlyn Larsen.  . Chicken pox   . Colon polyp   . 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 Essential hypertension, benign   . Hypercholesterolemia   . Myasthenia gravis ([place])   . Shingles 2013   Past Surgical History:  Procedure Laterality Date  . APPENDECTOMY  1958  . CHOLECYSTECTOMY  1979  . HERNIA REPAIR  1970  . TONSILLECTOMY  1958   Family History  Problem Relation Age of Onset  . Lung cancer Sister   . Prostate cancer Brother   . Lung cancer Brother   . Arthritis      parent  . Colon cancer Neg Hx    Social History   Social History  . Marital status: Married    Spouse name: N/A  . Number of children: N/A  . Years of education: N/A   Social History Main Topics  . Smoking status: Former Smoker    Quit date: 09/29/1958  . Smokeless tobacco: Never Used  . Alcohol use 0.0 oz/week     Comment: occasionally  . Drug use: No  . Sexual activity: No   Other Topics Concern  . None   Social History Narrative   Pt is married with 2 children - 1 son, 1 daughter. Previously self-employed in [employer]     Outpatient Encounter Prescriptions as of 01/15/2017  Medication Sig  . acetaminophen (TYLENOL) 500 MG tablet Take 500 mg by mouth every 6 (six) hours as needed.  Marland Kitchen albuterol (PROVENTIL HFA;VENTOLIN HFA) 108 (90 Base) MCG/ACT inhaler Inhale 2 puffs into the lungs every 6 (six) hours as needed for wheezing or shortness of breath.  Marland Kitchen aspirin 81 MG tablet Take 81 mg by mouth daily.  . finasteride (PROSCAR) 5 MG tablet Take 5 mg by mouth daily.  . furosemide (LASIX) 20 MG tablet Take 20 mg by mouth daily.  . hydroxypropyl methylcellulose / hypromellose (ISOPTO TEARS / GONIOVISC) 2.5 % ophthalmic solution Place 1 drop into both eyes daily.  Marland Kitchen ipratropium (ATROVENT) 0.06 % nasal spray Place 2 sprays into both nostrils 2 (two) times daily.  . isosorbide mononitrate (IMDUR) 30 MG 24 hr tablet Take 30 mg by mouth daily.  Marland Kitchen lisinopril (PRINIVIL,ZESTRIL) 5 MG tablet Take 5 mg by mouth daily.  . mycophenolate (CELLCEPT) 250 MG capsule Take 1,000 mg by mouth 2 (two) times daily.   . pravastatin (PRAVACHOL) 20 MG tablet Take 1 tablet (20 mg total) by mouth daily.  Marland Kitchen  prednisoLONE acetate (PRED FORTE) 1 % ophthalmic suspension Place 1 drop into the right eye 4 (four) times daily.  Marland Kitchen pyridostigmine (MESTINON) 60 MG tablet Take 30 mg by mouth 6 (six) times daily.   . tamsulosin (FLOMAX) 0.4 MG CAPS Take 0.4 mg by mouth daily.  . traZODone (DESYREL) 50 MG tablet Take 1 tablet (50 mg total) by mouth at bedtime as needed for sleep.  . [DISCONTINUED] amoxicillin-clavulanate (AUGMENTIN) 875-125 MG tablet Take 1 tablet by mouth every 12 (twelve) hours. (Patient not taking: Reported on 01/15/2017)  . [DISCONTINUED] bimatoprost (LUMIGAN) 0.01 % SOLN Place 1 drop into the right eye every other day.   . [DISCONTINUED] nystatin cream (MYCOSTATIN) Apply 1 application topically 2 (two) times daily. (Patient not taking: Reported on 01/15/2017)  . [DISCONTINUED] predniSONE (DELTASONE) 5 MG tablet 4 tabs po day1; 3 tabs po  day2; 2 tabs po day3; 1 tab po day4 (Patient not taking: Reported on 01/15/2017)   No facility-administered encounter medications on file as of 01/15/2017.     Review of Systems  Constitutional: Positive for fatigue. Negative for appetite change and unexpected weight change.  HENT: Negative for congestion and sinus pressure.   Respiratory: Negative for cough and chest tightness.        Breathing much improved.    Cardiovascular: Negative for chest pain, palpitations and leg swelling.  Gastrointestinal: Negative for abdominal pain, diarrhea, nausea and vomiting.  Genitourinary: Negative for difficulty urinating and dysuria.  Musculoskeletal: Negative for back pain and joint swelling.  Skin: Negative for color change and rash.  Neurological: Negative for dizziness, light-headedness and headaches.  Psychiatric/Behavioral: Negative for agitation and dysphoric mood.       Objective:     Blood pressure rechecked by me:  120/62  Physical Exam  Constitutional: He appears well-developed and well-nourished. No distress.  HENT:  Nose: Nose normal.  Mouth/Throat: Oropharynx is clear and moist.  Neck: Neck supple. No thyromegaly present.  Cardiovascular: Normal rate and regular rhythm.   Pulmonary/Chest: Effort normal and breath sounds normal. No respiratory distress.  Abdominal: Soft. Bowel sounds are normal. There is no tenderness.  Musculoskeletal: He exhibits no edema or tenderness.  Lymphadenopathy:    He has no cervical adenopathy.  Skin: No rash noted. No erythema.  Psychiatric: He has a normal mood and affect. His behavior is normal.    BP 126/84 (BP Location: Left Arm, Patient Position: Sitting, Cuff Size: Normal)   Pulse 67   Temp 98.6 F (37 C) (Oral)   Resp 14   Ht 5\' 11"  (1.803 m)   Wt 196 lb 6.4 oz (89.1 kg)   SpO2 95%   BMI 27.39 kg/m  Wt Readings from Last 3 Encounters:  01/15/17 196 lb 6.4 oz (89.1 kg)  01/02/17 193 lb 1.6 oz (87.6 kg)  09/09/16 202 lb (91.6 kg)      Lab Results  Component Value Date   WBC 8.4 01/01/2017   HGB 14.0 01/01/2017   HCT 42.6 01/01/2017   PLT 227 01/01/2017   GLUCOSE 106 (H) 01/01/2017   CHOL 177 09/09/2016   TRIG 62 12/27/2016   HDL 56.10 09/09/2016   LDLDIRECT 143.5 08/26/2013   LDLCALC 98 09/09/2016   ALT 22 12/27/2016   AST 27 12/27/2016   NA 139 01/01/2017   K 4.1 01/02/2017   CL 96 (L) 01/01/2017   CREATININE 0.93 01/01/2017   BUN 34 (H) 01/01/2017   CO2 39 (H) 01/01/2017   TSH 2.26 09/09/2016   HGBA1C  5.9 09/09/2016    Dg Abdomen 1 View  Result Date: 12/27/2016 CLINICAL DATA:  NG tube placement. EXAM: ABDOMEN - 1 VIEW COMPARISON:  None. FINDINGS: Enteric tube terminates in the left upper abdomen, likely in the proximal to mid gastric body with side hole projecting over the distal esophagus. Gas is visualized in nondilated colon in the upper abdomen. The lower abdomen was not imaged. Lumbar spondylosis is noted. The lung bases are more fully evaluated on concurrent chest radiograph. IMPRESSION: Enteric tube tip in the stomach with side hole in the distal esophagus. Electronically Signed   By: Logan Bores M.D.   On: 12/27/2016 14:18   Ct Angio Chest Pe W/cm &/or Wo Cm  Result Date: 12/27/2016 CLINICAL DATA:  Dizziness and increased shortness of breath over last 3 weeks EXAM: CT ANGIOGRAPHY CHEST WITH CONTRAST TECHNIQUE: Multidetector CT imaging of the chest was performed using the standard protocol during bolus administration of intravenous contrast. Multiplanar CT image reconstructions and MIPs were obtained to evaluate the vascular anatomy. CONTRAST:  75 cc Isovue 370 IV COMPARISON:  CT chest 06/13/2016 FINDINGS: Cardiovascular: Atherosclerotic calcifications aorta, great vessels and coronary arteries. Aneurysmal dilatation of the distal aortic arch 4.2 cm transverse. Ascending aorta measures up to 3.6 cm transverse. No evidence of aortic dissection. Pulmonary arteries well opacified and patent. No  evidence of pulmonary embolism. Mediastinum/Nodes: Esophagus unremarkable. Base of cervical region normal appearance. Few normal sized mediastinal and RIGHT hilar nodes without definite thoracic adenopathy. Lungs/Pleura: Scattered atelectasis in both lungs with consolidation in LEFT lower lobe. Minimal scattered interstitial prominence which could represent minimal edema or infiltrate. No pleural effusion or pneumothorax. Upper Abdomen: Unremarkable Musculoskeletal: Mild superior endplate compression deformity of a mid thoracic vertebra appears old. Views osseous demineralization. Schmorl's node at superior endplate of W10. Review of the MIP images confirms the above findings. IMPRESSION: No evidence of pulmonary embolism. 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 Coronary arterial calcification. Scattered atelectasis in both lungs with minimal interstitial prominence which could represent minimal edema or infiltrate. Recommend semi-annual imaging of the thoracic aorta followup by chest CTA or MRA and referral to cardiothoracic surgery if not already obtained. This recommendation follows 2010 ACCF/AHA/AATS/ACR/ASA/SCA/SCAI/SIR/STS/SVM Guidelines for the Diagnosis and Management of Patients With Thoracic Aortic Disease. Circulation. 2010; 121: X323-F57 Electronically Signed   By: Lavonia Dana M.D.   On: 12/27/2016 13:19   Dg Chest Port 1 View  Result Date: 12/28/2016 CLINICAL DATA:  Respiratory failure. EXAM: PORTABLE CHEST 1 VIEW COMPARISON:  Yesterday. FINDINGS: Endotracheal tube in satisfactory position. Nasogastric tube tip in the mid stomach. The side hole is not well visualized, most likely in the proximal stomach. Left subclavian catheter tip most likely at the lateral aspect of the origin of the superior vena cava. Stable enlarged cardiac silhouette. No significant change in left lower lobe airspace opacity. Small amount of linear  density in the right lower lung zone. Stable mild prominence of the interstitial markings. Aortic arch calcification. IMPRESSION: 1. Stable left lower lobe atelectasis or pneumonia. 2. Minimal right basilar atelectasis. 3. Stable cardiomegaly, mild chronic interstitial lung disease and aortic atherosclerosis. Electronically Signed   By: Claudie Revering M.D.   On: 12/28/2016 07:16   Dg Chest Portable 1 View  Result Date: 12/27/2016 CLINICAL DATA:  Central line placement EXAM: PORTABLE CHEST 1 VIEW COMPARISON:  Portable exam 1457 hours compared to 1355 hours FINDINGS: Tip of endotracheal tube projects 6.3 cm above carina, tip directed  toward RIGHT lateral wall. Nasogastric tube extends into stomach. LEFT subclavian central venous catheter tip projecting over SVC. External pacing leads present. Enlargement of cardiac silhouette. Atherosclerotic calcification aorta. Bibasilar atelectasis with questionable perihilar infiltrate versus edema. No pleural effusion or pneumothorax. IMPRESSION: No pneumothorax following central line placement. Bibasilar atelectasis with question mild perihilar infiltrate or edema. Electronically Signed   By: Lavonia Dana M.D.   On: 12/27/2016 15:15   Dg Chest Port 1 View  Result Date: 12/27/2016 CLINICAL DATA:  Status post intubation. EXAM: PORTABLE CHEST 1 VIEW COMPARISON:  12/27/2016 at 10:42 a.m. FINDINGS: Endotracheal tube tip abuts the right lateral aspect of the thoracic trachea, 3 cm above the carina. Nasogastric tube passes below the diaphragm and into the stomach. Persistent lung base opacity is noted consistent with atelectasis. No new lung abnormalities. No pneumothorax. IMPRESSION: 1. Endotracheal tube tip projects 3 cm above the carina. 2. Nasogastric tube is well positioned passing below the diaphragm well into the stomach. Electronically Signed   By: Lajean Manes M.D.   On: 12/27/2016 14:21   Dg Chest Port 1 View  Result Date: 12/27/2016 CLINICAL DATA:  Chest  congestion, nonproductive cough and shortness of breath. EXAM: PORTABLE CHEST 1 VIEW COMPARISON:  06/16/2014. FINDINGS: Stable borderline enlarged cardiac silhouette. Aortic arch calcifications. Mildly elevated left hemidiaphragm. Resolved bibasilar atelectasis with interval minimal linear density laterally. Minimally prominent interstitial markings. Diffuse osteopenia. IMPRESSION: 1. Minimal interstitial pneumonitis or edema. 2. Minimal left basilar linear atelectasis or scarring. 3. Aortic atherosclerosis. Electronically Signed   By: Claudie Revering M.D.   On: 12/27/2016 11:08       Assessment & Plan:   Problem List Items Addressed This Visit    Acute respiratory failure ([place])    Admitted with acute hypoxic respiratory failure.  Weaned off oxygen.  Breathing better.  Admitted with pneumonia.  Will need f/u cxr to confirm clearance.        Bladder cancer ([place])    Followed by Dr Jacqlyn Larsen.        CAD (coronary artery disease)    Followed by cardiology.  Cardiology evaluated in the hospital.  Felt no further w/up warranted.        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  Stable.            [name], MD

## 2017-01-26 ENCOUNTER — Encounter: Payer: Self-pay | Admitting: Internal Medicine

## 2017-01-26 NOTE — Assessment & Plan Note (Signed)
Admitted with acute hypoxic respiratory failure.  Weaned off oxygen.  Breathing better.  Admitted with pneumonia.  Will need f/u cxr to confirm clearance.

## 2017-01-26 NOTE — Assessment & Plan Note (Signed)
Followed by Dr Cope.  

## 2017-01-26 NOTE — Assessment & Plan Note (Signed)
Followed by Dr Nadara Mustard.  Stable.

## 2017-01-26 NOTE — Assessment & Plan Note (Signed)
Followed by cardiology.  Cardiology evaluated in the hospital.  Felt no further w/up warranted.

## 2017-01-29 DIAGNOSIS — L570 Actinic keratosis: Secondary | ICD-10-CM | POA: Diagnosis not present

## 2017-01-29 DIAGNOSIS — L578 Other skin changes due to chronic exposure to nonionizing radiation: Secondary | ICD-10-CM | POA: Diagnosis not present

## 2017-01-29 DIAGNOSIS — L219 Seborrheic dermatitis, unspecified: Secondary | ICD-10-CM | POA: Diagnosis not present

## 2017-01-29 DIAGNOSIS — L820 Inflamed seborrheic keratosis: Secondary | ICD-10-CM | POA: Diagnosis not present

## 2017-03-25 ENCOUNTER — Encounter: Payer: Self-pay | Admitting: Internal Medicine

## 2017-03-25 ENCOUNTER — Ambulatory Visit (INDEPENDENT_AMBULATORY_CARE_PROVIDER_SITE_OTHER): Payer: Medicare Other | Admitting: Internal Medicine

## 2017-03-25 VITALS — BP 108/52 | HR 62 | Temp 98.2°F | Resp 14 | Ht 71.0 in | Wt 200.4 lb

## 2017-03-25 DIAGNOSIS — C679 Malignant neoplasm of bladder, unspecified: Secondary | ICD-10-CM

## 2017-03-25 DIAGNOSIS — E7800 Pure hypercholesterolemia, unspecified: Secondary | ICD-10-CM | POA: Diagnosis not present

## 2017-03-25 DIAGNOSIS — I712 Thoracic aortic aneurysm, without rupture, unspecified: Secondary | ICD-10-CM

## 2017-03-25 DIAGNOSIS — I2510 Atherosclerotic heart disease of native coronary artery without angina pectoris: Secondary | ICD-10-CM

## 2017-03-25 DIAGNOSIS — G7080 Lambert-Eaton syndrome, unspecified: Secondary | ICD-10-CM

## 2017-03-25 DIAGNOSIS — R9389 Abnormal findings on diagnostic imaging of other specified body structures: Secondary | ICD-10-CM

## 2017-03-25 DIAGNOSIS — R938 Abnormal findings on diagnostic imaging of other specified body structures: Secondary | ICD-10-CM

## 2017-03-25 DIAGNOSIS — R739 Hyperglycemia, unspecified: Secondary | ICD-10-CM | POA: Diagnosis not present

## 2017-03-25 NOTE — Progress Notes (Signed)
Pre-visit discussion using our clinic review tool. No additional management support is needed unless otherwise documented below in the visit note.  

## 2017-03-25 NOTE — Progress Notes (Signed)
Patient ID: NAFTALI CARCHI, male   DOB: Jul 27, 1926, 83 y.o.   MRN: [id number]   Subjective:    Patient ID: PENIEL HASS, male    DOB: Mar 15, 1926, 83 y.o.   MRN: [id number]  HPI  Patient here for a scheduled follow up.  He is accompanied by his wife.  History obtained from both of them.  Seeing Dr Jacqlyn Larsen for bladder cancer.  S/p cystoscopy 10/2016.  Also sees Dr Nadara Mustard for myasthenia gravis.  Stable.  Last evaluated 11/2016.  Recommended f/u in 6-8 months.  Recommended to monitor tremor.  Note reviewed.  He was admitted 12/27/16 with acute hypoxic respiratory failure.  Diagnosed with pneumonia.  Treated with abx and prednisone.  See last note for details.  Since being home has done well.  Breathing at his baseline.  Denies any sob.  No increased cough or congestion.  No acid reflux.  No chest pain.  Tries to stay active.  No abdominal pain.  Bowels moving.  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  Discussed today.  Discussed referral for f/u.  He is in agreement.     Past Medical History:  Diagnosis Date  . Allergy   . Anemia   . Cancer ([place]) 11-24-12   bladder. BCG treatments by Dr Jacqlyn Larsen.  . Chicken pox   . Colon polyp   . 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 Essential hypertension, benign   . Hypercholesterolemia   . Myasthenia gravis ([place])   . Shingles 2013   Past Surgical History:  Procedure Laterality Date  . APPENDECTOMY  1958  . CHOLECYSTECTOMY  1979  . HERNIA REPAIR  1970  . TONSILLECTOMY  1958   Family History  Problem Relation Age of Onset  . Lung cancer Sister   . Prostate cancer Brother   . Lung cancer Brother   . Arthritis Unknown        parent  . Colon cancer Neg Hx    Social History   Social History  . Marital status: Married    Spouse name: N/A  . Number of children: N/A  . Years of education: N/A   Social History Main Topics  . Smoking status: Former Smoker    Quit date: 09/29/1958  . Smokeless tobacco: Never  Used  . Alcohol use 0.0 oz/week     Comment: occasionally  . Drug use: No  . Sexual activity: No   Other Topics Concern  . None   Social History Narrative   Pt is married with 2 children - 1 son, 1 daughter. Previously self-employed in [employer]    Outpatient Encounter Prescriptions as of 03/25/2017  Medication Sig  . acetaminophen (TYLENOL) 500 MG tablet Take 500 mg by mouth every 6 (six) hours as needed.  Marland Kitchen albuterol (PROVENTIL HFA;VENTOLIN HFA) 108 (90 Base) MCG/ACT inhaler Inhale 2 puffs into the lungs every 6 (six) hours as needed for wheezing or shortness of breath.  Marland Kitchen aspirin 81 MG tablet Take 81 mg by mouth daily.  . finasteride (PROSCAR) 5 MG tablet Take 5 mg by mouth daily.  . furosemide (LASIX) 20 MG tablet Take 20 mg by mouth daily.  . hydroxypropyl methylcellulose / hypromellose (ISOPTO TEARS / GONIOVISC) 2.5 % ophthalmic solution Place 1 drop into both eyes daily.  Marland Kitchen ipratropium (ATROVENT) 0.06 % nasal spray Place 2 sprays into both nostrils 2 (two) times daily.  . isosorbide mononitrate (IMDUR) 30 MG 24 hr tablet  Take 30 mg by mouth daily.  Marland Kitchen lisinopril (PRINIVIL,ZESTRIL) 5 MG tablet Take 5 mg by mouth daily.  . mycophenolate (CELLCEPT) 250 MG capsule Take 1,000 mg by mouth 2 (two) times daily.   . pravastatin (PRAVACHOL) 20 MG tablet Take 1 tablet (20 mg total) by mouth daily.  . prednisoLONE acetate (PRED FORTE) 1 % ophthalmic suspension Place 1 drop into the right eye 4 (four) times daily.  Marland Kitchen pyridostigmine (MESTINON) 60 MG tablet Take 30 mg by mouth 6 (six) times daily.   . tamsulosin (FLOMAX) 0.4 MG CAPS Take 0.4 mg by mouth daily.  . traZODone (DESYREL) 50 MG tablet Take 1 tablet (50 mg total) by mouth at bedtime as needed for sleep.   No facility-administered encounter medications on file as of 03/25/2017.     Review of Systems  Constitutional: Negative for appetite change and unexpected weight change.  HENT: Negative for congestion and sinus pressure.     Respiratory: Negative for cough, chest tightness and shortness of breath.   Cardiovascular: Negative for chest pain, palpitations and leg swelling.  Gastrointestinal: Negative for abdominal pain, diarrhea, nausea and vomiting.  Genitourinary: Negative for difficulty urinating and dysuria.  Musculoskeletal: Negative for back pain and joint swelling.  Skin: Negative for color change and rash.  Neurological: Negative for dizziness, light-headedness and headaches.  Psychiatric/Behavioral: Negative for agitation and dysphoric mood.       Objective:     Blood pressure rechecked by me:  122/78  Physical Exam  Constitutional: He appears well-developed and well-nourished. No distress.  HENT:  Nose: Nose normal.  Mouth/Throat: Oropharynx is clear and moist.  Neck: Neck supple. No thyromegaly present.  Cardiovascular: Normal rate and regular rhythm.   Pulmonary/Chest: Effort normal and breath sounds normal. No respiratory distress.  Abdominal: Soft. Bowel sounds are normal. There is no tenderness.  Musculoskeletal: He exhibits no edema or tenderness.  Lymphadenopathy:    He has no cervical adenopathy.  Skin: No rash noted. No erythema.  Psychiatric: He has a normal mood and affect. His behavior is normal.    BP (!) 108/52 (BP Location: Right Arm, Patient Position: Sitting, Cuff Size: Normal)   Pulse 62   Temp 98.2 F (36.8 C) (Oral)   Resp 14   Ht 5\' 11"  (1.803 m)   Wt 200 lb 6.4 oz (90.9 kg)   BMI 27.95 kg/m  Wt Readings from Last 3 Encounters:  03/25/17 200 lb 6.4 oz (90.9 kg)  01/15/17 196 lb 6.4 oz (89.1 kg)  01/02/17 193 lb 1.6 oz (87.6 kg)     Lab Results  Component Value Date   WBC 8.4 01/01/2017   HGB 14.0 01/01/2017   HCT 42.6 01/01/2017   PLT 227 01/01/2017   GLUCOSE 106 (H) 01/01/2017   CHOL 177 09/09/2016   TRIG 62 12/27/2016   HDL 56.10 09/09/2016   LDLDIRECT 143.5 08/26/2013   LDLCALC 98 09/09/2016   ALT 22 12/27/2016   AST 27 12/27/2016   NA 139  01/01/2017   K 4.1 01/02/2017   CL 96 (L) 01/01/2017   CREATININE 0.93 01/01/2017   BUN 34 (H) 01/01/2017   CO2 39 (H) 01/01/2017   TSH 2.26 09/09/2016   HGBA1C 5.9 09/09/2016    Dg Abdomen 1 View  Result Date: 12/27/2016 CLINICAL DATA:  NG tube placement. EXAM: ABDOMEN - 1 VIEW COMPARISON:  None. FINDINGS: Enteric tube terminates in the left upper abdomen, likely in the proximal to mid gastric body with side hole projecting over  the distal esophagus. Gas is visualized in nondilated colon in the upper abdomen. The lower abdomen was not imaged. Lumbar spondylosis is noted. The lung bases are more fully evaluated on concurrent chest radiograph. IMPRESSION: Enteric tube tip in the stomach with side hole in the distal esophagus. Electronically Signed   By: Logan Bores M.D.   On: 12/27/2016 14:18   Ct Angio Chest Pe W/cm &/or Wo Cm  Result Date: 12/27/2016 CLINICAL DATA:  Dizziness and increased shortness of breath over last 3 weeks EXAM: CT ANGIOGRAPHY CHEST WITH CONTRAST TECHNIQUE: Multidetector CT imaging of the chest was performed using the standard protocol during bolus administration of intravenous contrast. Multiplanar CT image reconstructions and MIPs were obtained to evaluate the vascular anatomy. CONTRAST:  75 cc Isovue 370 IV COMPARISON:  CT chest 06/13/2016 FINDINGS: Cardiovascular: Atherosclerotic calcifications aorta, great vessels and coronary arteries. Aneurysmal dilatation of the distal aortic arch 4.2 cm transverse. Ascending aorta measures up to 3.6 cm transverse. No evidence of aortic dissection. Pulmonary arteries well opacified and patent. No evidence of pulmonary embolism. Mediastinum/Nodes: Esophagus unremarkable. Base of cervical region normal appearance. Few normal sized mediastinal and RIGHT hilar nodes without definite thoracic adenopathy. Lungs/Pleura: Scattered atelectasis in both lungs with consolidation in LEFT lower lobe. Minimal scattered interstitial prominence which  could represent minimal edema or infiltrate. No pleural effusion or pneumothorax. Upper Abdomen: Unremarkable Musculoskeletal: Mild superior endplate compression deformity of a mid thoracic vertebra appears old. Views osseous demineralization. Schmorl's node at superior endplate of E99. Review of the MIP images confirms the above findings. IMPRESSION: No evidence of pulmonary embolism. 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 Coronary arterial calcification. Scattered atelectasis in both lungs with minimal interstitial prominence which could represent minimal edema or infiltrate. Recommend semi-annual imaging of the thoracic aorta followup by chest CTA or MRA and referral to cardiothoracic surgery if not already obtained. This recommendation follows 2010 ACCF/AHA/AATS/ACR/ASA/SCA/SCAI/SIR/STS/SVM Guidelines for the Diagnosis and Management of Patients With Thoracic Aortic Disease. Circulation. 2010; 121: B716-R67 Electronically Signed   By: Lavonia Dana M.D.   On: 12/27/2016 13:19   Dg Chest Port 1 View  Result Date: 12/28/2016 CLINICAL DATA:  Respiratory failure. EXAM: PORTABLE CHEST 1 VIEW COMPARISON:  Yesterday. FINDINGS: Endotracheal tube in satisfactory position. Nasogastric tube tip in the mid stomach. The side hole is not well visualized, most likely in the proximal stomach. Left subclavian catheter tip most likely at the lateral aspect of the origin of the superior vena cava. Stable enlarged cardiac silhouette. No significant change in left lower lobe airspace opacity. Small amount of linear density in the right lower lung zone. Stable mild prominence of the interstitial markings. Aortic arch calcification. IMPRESSION: 1. Stable left lower lobe atelectasis or pneumonia. 2. Minimal right basilar atelectasis. 3. Stable cardiomegaly, mild chronic interstitial lung disease and aortic atherosclerosis. Electronically Signed   By: Claudie Revering M.D.   On: 12/28/2016 07:16   Dg Chest Portable 1 View  Result Date: 12/27/2016 CLINICAL DATA:  Central line placement EXAM: PORTABLE CHEST 1 VIEW COMPARISON:  Portable exam 1457 hours compared to 1355 hours FINDINGS: Tip of endotracheal tube projects 6.3 cm above carina, tip directed toward RIGHT lateral wall. Nasogastric tube extends into stomach. LEFT subclavian central venous catheter tip projecting over SVC. External pacing leads present. Enlargement of cardiac silhouette. Atherosclerotic calcification aorta. Bibasilar atelectasis with questionable perihilar infiltrate versus edema. No pleural effusion or pneumothorax. IMPRESSION: No pneumothorax following central line placement.  Bibasilar atelectasis with question mild perihilar infiltrate or edema. Electronically Signed   By: Lavonia Dana M.D.   On: 12/27/2016 15:15   Dg Chest Port 1 View  Result Date: 12/27/2016 CLINICAL DATA:  Status post intubation. EXAM: PORTABLE CHEST 1 VIEW COMPARISON:  12/27/2016 at 10:42 a.m. FINDINGS: Endotracheal tube tip abuts the right lateral aspect of the thoracic trachea, 3 cm above the carina. Nasogastric tube passes below the diaphragm and into the stomach. Persistent lung base opacity is noted consistent with atelectasis. No new lung abnormalities. No pneumothorax. IMPRESSION: 1. Endotracheal tube tip projects 3 cm above the carina. 2. Nasogastric tube is well positioned passing below the diaphragm well into the stomach. Electronically Signed   By: Lajean Manes M.D.   On: 12/27/2016 14:21   Dg Chest Port 1 View  Result Date: 12/27/2016 CLINICAL DATA:  Chest congestion, nonproductive cough and shortness of breath. EXAM: PORTABLE CHEST 1 VIEW COMPARISON:  06/16/2014. FINDINGS: Stable borderline enlarged cardiac silhouette. Aortic arch calcifications. Mildly elevated left hemidiaphragm. Resolved bibasilar atelectasis with interval minimal linear density laterally. Minimally prominent interstitial markings.  Diffuse osteopenia. IMPRESSION: 1. Minimal interstitial pneumonitis or edema. 2. Minimal left basilar linear atelectasis or scarring. 3. Aortic atherosclerosis. Electronically Signed   By: Claudie Revering M.D.   On: 12/27/2016 11:08       Assessment & Plan:   Problem List Items Addressed This Visit    Abnormal chest CT    With abnormal chest CT as outlined.  Admitted and treated for pneumonia.  Found to have 4.2 cm distal aortic arch aneurysm.  Will refer to Dr Genevive Bi.  Will hold on f/u cxr secondary to need for f/u CT.  Refer as outlined.        Relevant Orders   Ambulatory referral to Cardiothoracic Surgery   Aortic aneurysm [facility])    Had CTA/MRA 11/2016.  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  Discussed results and plans for further f/u and evaluation.  Agreeable to referral to Dr Genevive Bi.        Relevant Orders   Ambulatory referral to Cardiothoracic Surgery   Bladder cancer [facility])    Followed by Dr Jacqlyn Larsen.  Stable.  Note reviewed.       CAD (coronary artery disease)    Followed by cardiology.  Stable.  Doing well.        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  Stable.  Last evaluated 11/2016.        Relevant Orders   CBC with Differential/Platelet   Hypercholesterolemia    On pravastatin.  Low cholesterol diet and exercise.  Follow lipid panel and liver function tests.        Relevant Orders   Hepatic function panel   Lipid panel    Other Visit Diagnoses    Hyperglycemia    -  Primary   Relevant Orders   Hemoglobin V9T   Basic metabolic panel       [name], MD

## 2017-03-28 DIAGNOSIS — I719 Aortic aneurysm of unspecified site, without rupture: Secondary | ICD-10-CM

## 2017-03-28 DIAGNOSIS — R9389 Abnormal findings on diagnostic imaging of other specified body structures: Secondary | ICD-10-CM | POA: Insufficient documentation

## 2017-03-28 HISTORY — DX: Aortic aneurysm of unspecified site, without rupture: I71.9

## 2017-03-28 HISTORY — DX: Abnormal findings on diagnostic imaging of other specified body structures: R93.89

## 2017-03-28 NOTE — Assessment & Plan Note (Signed)
Followed by Dr Nadara Mustard.  Stable.  Last evaluated 11/2016.

## 2017-03-28 NOTE — Assessment & Plan Note (Signed)
Had CTA/MRA 11/2016.  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  Discussed results and plans for further f/u and evaluation.  Agreeable to referral to Dr Genevive Bi.

## 2017-03-28 NOTE — Assessment & Plan Note (Signed)
Followed by Dr Jacqlyn Larsen.  Stable.  Note reviewed.

## 2017-03-28 NOTE — Assessment & Plan Note (Signed)
On pravastatin.  Low cholesterol diet and exercise.  Follow lipid panel and liver function tests.   

## 2017-03-28 NOTE — Assessment & Plan Note (Signed)
Followed by cardiology.  Stable.  Doing well.   

## 2017-03-28 NOTE — Assessment & Plan Note (Signed)
With abnormal chest CT as outlined.  Admitted and treated for pneumonia.  Found to have 4.2 cm distal aortic arch aneurysm.  Will refer to Dr Genevive Bi.  Will hold on f/u cxr secondary to need for f/u CT.  Refer as outlined.

## 2017-04-02 ENCOUNTER — Telehealth: Payer: Self-pay | Admitting: Cardiothoracic Surgery

## 2017-04-02 NOTE — Telephone Encounter (Signed)
I have called patient to make an appointment, per referral. No answer. I have left a message for patient to call back. 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

## 2017-04-14 ENCOUNTER — Other Ambulatory Visit: Payer: Self-pay

## 2017-04-14 DIAGNOSIS — R9389 Abnormal findings on diagnostic imaging of other specified body structures: Secondary | ICD-10-CM

## 2017-04-16 ENCOUNTER — Telehealth: Payer: Self-pay

## 2017-04-16 ENCOUNTER — Encounter: Payer: Self-pay | Admitting: Cardiothoracic Surgery

## 2017-04-16 ENCOUNTER — Ambulatory Visit (INDEPENDENT_AMBULATORY_CARE_PROVIDER_SITE_OTHER): Payer: Medicare Other | Admitting: Cardiothoracic Surgery

## 2017-04-16 VITALS — BP 134/69 | HR 62 | Temp 97.8°F | Resp 18 | Ht 71.0 in | Wt 200.0 lb

## 2017-04-16 DIAGNOSIS — I712 Thoracic aortic aneurysm, without rupture, unspecified: Secondary | ICD-10-CM

## 2017-04-16 NOTE — Progress Notes (Signed)
Patient ID: [name], male   DOB: 1926/07/29, 83 y.o.   MRN: [id number]  Chief Complaint  Patient presents with  . New Patient (Initial Visit)    aortic Arch Dilation    Referred By Dr. Nicki Reaper Reason for Referral Aortic Aneurysm  HPI Location, Quality, Duration, Severity, Timing, Context, Modifying Factors, Associated Signs and Symptoms.  [name] is a 83 y.o. male.  In late March or early April of this year he was admitted to [facility] with chief complaints of shortness of breath. At that time he underwent an extensive evaluation which included a CT scan of the chest to rule out pulmonary emboli. That scan did not reveal any evidence of pulmonary embolism but did show a distal aortic arch aneurysm measuring about 4.5 cm. After his therapy in the hospital he was discharged to home where he states he's made almost a complete recovery. Despite his advanced age of 72 years he continues to be very active and has no significant limitations regarding activities of daily living. He does have a prior smoking history but he quit 30 years ago. He has a history of prior myocardial infarction but he denied any recent history of atherosclerosis hypercholesterolemia or hypertension. However it is noted that he does take Prinivil and Pravachol. He denied any recent weight loss or weight gain. He denied any chest pain, shortness of breath or wheezing. He denied any hemoptysis. Of note is that he did have a chest CT scan performed in 2006 which revealed the aneurysm to be approximately 3.9 cm.  He does have a family history of aortic aneurysm. He states that his brother underwent replacement of his aortic aneurysm about 10 years ago at [facility] and is doing well with that.   Past Medical History:  Diagnosis Date  . Allergy   . Anemia   . Cancer ([place]) 11-24-12   bladder. BCG treatments by Dr Jacqlyn Larsen.  . Chicken pox   . Colon polyp   . 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 Essential  hypertension, benign   . Hypercholesterolemia   . Myasthenia gravis ([place])   . Shingles 2013    Past Surgical History:  Procedure Laterality Date  . APPENDECTOMY  1958  . CHOLECYSTECTOMY  1979  . HERNIA REPAIR  1970  . TONSILLECTOMY  1958    Family History  Problem Relation Age of Onset  . Lung cancer Sister   . Prostate cancer Brother   . Lung cancer Brother   . Arthritis Unknown        parent  . Colon cancer Neg Hx     Social History Social History  Substance Use Topics  . Smoking status: Former Smoker    Quit date: 09/29/1958  . Smokeless tobacco: Never Used  . Alcohol use 0.0 oz/week     Comment: occasionally    Allergies  Allergen Reactions  . 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 Ciprofloxacin Other (See Comments)    Last resort due to [place].   . Sulfa Antibiotics Rash    Current Outpatient Prescriptions  Medication Sig Dispense Refill  . acetaminophen (TYLENOL) 500 MG tablet Take 500 mg by mouth every 6 (six) hours as needed.    Marland Kitchen aspirin 81 MG tablet Take 81 mg by  mouth daily.    . finasteride (PROSCAR) 5 MG tablet Take 5 mg by mouth daily.    . furosemide (LASIX) 20 MG tablet Take 20 mg by mouth daily.    . hydroxypropyl methylcellulose / hypromellose (ISOPTO TEARS / GONIOVISC) 2.5 % ophthalmic solution Place 1 drop into both eyes daily.    Marland Kitchen ipratropium (ATROVENT) 0.06 % nasal spray Place 2 sprays into both nostrils 2 (two) times daily. 15 mL 0  . isosorbide mononitrate (IMDUR) 30 MG 24 hr tablet Take 30 mg by mouth daily.    Marland Kitchen lisinopril (PRINIVIL,ZESTRIL) 5 MG tablet Take 5 mg by mouth daily.    . mycophenolate (CELLCEPT) 250 MG capsule Take 1,000 mg by mouth 2 (two) times daily.     .  pravastatin (PRAVACHOL) 20 MG tablet Take 1 tablet (20 mg total) by mouth daily. 90 tablet 1  . prednisoLONE acetate (PRED FORTE) 1 % ophthalmic suspension Place 1 drop into the right eye 4 (four) times daily.    Marland Kitchen pyridostigmine (MESTINON) 60 MG tablet Take 30 mg by mouth 6 (six) times daily.     . tamsulosin (FLOMAX) 0.4 MG CAPS Take 0.4 mg by mouth daily.    . traZODone (DESYREL) 50 MG tablet Take 1 tablet (50 mg total) by mouth at bedtime as needed for sleep. 10 tablet 0   No current facility-administered medications for this visit.       Review of Systems A complete review of systems was asked and was negative except for the following positive findingsNone  Blood pressure 134/69, pulse 62, temperature 97.8 F (36.6 C), temperature source Oral, resp. rate 18, height 5\' 11"  (1.803 m), weight 200 lb (90.7 kg), SpO2 94 %.  Physical Exam CONSTITUTIONAL:  Pleasant, well-developed, well-nourished, and in no acute distress. EYES: Pupils equal and reactive to light, Sclera non-icteric EARS, NOSE, MOUTH AND THROAT:  The oropharynx was clear.  Dentition is Present and in good repair.  Oral mucosa pink and moist. LYMPH NODES:  Lymph nodes in the neck and axillae were normal RESPIRATORY:  Lungs were clear with the exception of some dry crackles at the left base..  Normal respiratory effort without pathologic use of accessory muscles of respiration CARDIOVASCULAR: Heart was regular with a short systolic murmur heard best at the upper left sternal border. There was no radiation into the carotids.  There were no carotid bruits. GI: The abdomen was soft, nontender, and nondistended. There were no palpable masses. There was no hepatosplenomegaly. There were normal bowel sounds in all quadrants. GU:  Rectal deferred.   MUSCULOSKELETAL:  Normal muscle strength and tone.  No clubbing or cyanosis.   SKIN:  There were no pathologic skin lesions.  There were no nodules on palpation. NEUROLOGIC:  Sensation  is normal.  Cranial nerves are grossly intact. PSYCH:  Oriented to person, place and time.  Mood and affect are normal.  Data Reviewed CT scans from 2018 in 2006  I have personally reviewed the patient's imaging, laboratory findings and medical records.    Assessment    Distal aortic arch aneurysm measuring less than 5 cm. The current recommendation is to undergo semiannual imaging. Despite his advanced age he would be a candidate for endovascular repair if needed.  He also has a history of myasthenia gravis and is on prednisone and CellCept. The interaction of prednisone with his aneurysm is unclear. He also takes Pravachol and lisinopril although he denies any history of hypercholesterolemia or hypertension.    Plan  I have asked him to come back in 3 months time with a non-infused chest CT. I discussed with him the option of performing an MRI as a way to follow this but that would be difficult for him to live for such a length of time. Therefore we will go ahead and get the CT scan without contrast. I will see him back again in 3 months.       Nestor Lewandowsky, MD 04/16/2017, 11:35 AM

## 2017-04-16 NOTE — Telephone Encounter (Signed)
Left message for patient to call office regarding appointments.  07/24/17 @ 8:45 am @ [facility]-CT without contrast 07/24/17 @ 10:00 am Dr.Oaks.  Appointment reminders mailed today.

## 2017-04-16 NOTE — Telephone Encounter (Signed)
Patient is returning [facility] phone call. I have relayed the information documented previously to the patient. He understood and had no further questions.

## 2017-04-16 NOTE — Patient Instructions (Signed)
I will schedule the CT chest scan and make a follow up appointment with Dr.Oaks. I will call you tomorrow with the appointment information.

## 2017-04-23 DIAGNOSIS — I071 Rheumatic tricuspid insufficiency: Secondary | ICD-10-CM | POA: Diagnosis not present

## 2017-04-23 DIAGNOSIS — I5022 Chronic systolic (congestive) heart failure: Secondary | ICD-10-CM | POA: Diagnosis not present

## 2017-04-23 DIAGNOSIS — I6523 Occlusion and stenosis of bilateral carotid arteries: Secondary | ICD-10-CM | POA: Diagnosis not present

## 2017-04-23 DIAGNOSIS — I2510 Atherosclerotic heart disease of native coronary artery without angina pectoris: Secondary | ICD-10-CM | POA: Diagnosis not present

## 2017-04-29 ENCOUNTER — Telehealth: Payer: Self-pay | Admitting: Internal Medicine

## 2017-04-29 DIAGNOSIS — M25552 Pain in left hip: Secondary | ICD-10-CM

## 2017-04-29 NOTE — Telephone Encounter (Signed)
If having persistent hip pain, needs to be reevaluated.  I can refer him to ortho for evaluation.  May save him two trips.

## 2017-04-29 NOTE — Telephone Encounter (Signed)
Patient called saying that he has left hip pain that started about a month ago. He said that he spoke with you about this at his appointment on 03/25/17 but chose to treat with tylenol. Tylenol is no longer helping. He is now having trouble walking and hip still hurts even when laying down. He is requesting something stronger for pain.

## 2017-04-29 NOTE — Telephone Encounter (Signed)
Pt called and stated that he is still having pain in his left hip. He is having trouble walking and even when laying down. He would like to know if Dr. Nicki Reaper could call in something for the pain. Please advise, thank you!  Call pt @ [phone] (may leave msg)

## 2017-04-29 NOTE — Telephone Encounter (Signed)
Spoke with patient and he would like a referral to ortho to be evaluated.

## 2017-04-29 NOTE — Telephone Encounter (Signed)
Order placed for ortho referral.   

## 2017-05-04 DIAGNOSIS — M7062 Trochanteric bursitis, left hip: Secondary | ICD-10-CM | POA: Diagnosis not present

## 2017-05-04 DIAGNOSIS — B0233 Zoster keratitis: Secondary | ICD-10-CM | POA: Diagnosis not present

## 2017-06-29 ENCOUNTER — Ambulatory Visit (INDEPENDENT_AMBULATORY_CARE_PROVIDER_SITE_OTHER): Payer: Medicare Other

## 2017-06-29 DIAGNOSIS — Z23 Encounter for immunization: Secondary | ICD-10-CM | POA: Diagnosis not present

## 2017-07-02 DIAGNOSIS — M7062 Trochanteric bursitis, left hip: Secondary | ICD-10-CM | POA: Diagnosis not present

## 2017-07-06 ENCOUNTER — Other Ambulatory Visit (INDEPENDENT_AMBULATORY_CARE_PROVIDER_SITE_OTHER): Payer: Medicare Other

## 2017-07-06 DIAGNOSIS — E7800 Pure hypercholesterolemia, unspecified: Secondary | ICD-10-CM | POA: Diagnosis not present

## 2017-07-06 DIAGNOSIS — G7080 Lambert-Eaton syndrome, unspecified: Secondary | ICD-10-CM

## 2017-07-06 DIAGNOSIS — R739 Hyperglycemia, unspecified: Secondary | ICD-10-CM | POA: Diagnosis not present

## 2017-07-06 LAB — CBC WITH DIFFERENTIAL/PLATELET
BASOS ABS: 0 10*3/uL (ref 0.0–0.1)
Basophils Relative: 0.3 % (ref 0.0–3.0)
EOS ABS: 0 10*3/uL (ref 0.0–0.7)
Eosinophils Relative: 0.4 % (ref 0.0–5.0)
HCT: 39.3 % (ref 39.0–52.0)
Hemoglobin: 12.7 g/dL — ABNORMAL LOW (ref 13.0–17.0)
LYMPHS PCT: 20.7 % (ref 12.0–46.0)
Lymphs Abs: 1.2 10*3/uL (ref 0.7–4.0)
MCHC: 32.2 g/dL (ref 30.0–36.0)
MCV: 94.1 fl (ref 78.0–100.0)
MONOS PCT: 10 % (ref 3.0–12.0)
Monocytes Absolute: 0.6 10*3/uL (ref 0.1–1.0)
NEUTROS ABS: 4 10*3/uL (ref 1.4–7.7)
NEUTROS PCT: 68.6 % (ref 43.0–77.0)
PLATELETS: 173 10*3/uL (ref 150.0–400.0)
RBC: 4.18 Mil/uL — ABNORMAL LOW (ref 4.22–5.81)
RDW: 14.5 % (ref 11.5–15.5)
WBC: 5.9 10*3/uL (ref 4.0–10.5)

## 2017-07-06 LAB — BASIC METABOLIC PANEL
BUN: 24 mg/dL — AB (ref 6–23)
CHLORIDE: 106 meq/L (ref 96–112)
CO2: 29 meq/L (ref 19–32)
Calcium: 9.1 mg/dL (ref 8.4–10.5)
Creatinine, Ser: 1.05 mg/dL (ref 0.40–1.50)
GFR: 70.71 mL/min (ref >60.00)
GLUCOSE: 102 mg/dL — AB (ref 70–99)
POTASSIUM: 4.4 meq/L (ref 3.5–5.1)
Sodium: 141 mEq/L (ref 135–145)

## 2017-07-06 LAB — HEPATIC FUNCTION PANEL
ALT: 12 U/L (ref 0–53)
AST: 10 U/L (ref 0–37)
Albumin: 4 g/dL (ref 3.5–5.2)
Alkaline Phosphatase: 42 U/L (ref 39–117)
BILIRUBIN DIRECT: 0.1 mg/dL (ref 0.0–0.3)
BILIRUBIN TOTAL: 0.5 mg/dL (ref 0.2–1.2)
TOTAL PROTEIN: 6.3 g/dL (ref 6.0–8.3)

## 2017-07-06 LAB — LIPID PANEL
CHOLESTEROL: 166 mg/dL (ref 0–200)
HDL: 53.5 mg/dL (ref >39.00)
LDL Cholesterol: 90 mg/dL (ref 0–99)
NonHDL: 112.33
TRIGLYCERIDES: 113 mg/dL (ref 0.0–149.0)
Total CHOL/HDL Ratio: 3
VLDL: 22.6 mg/dL (ref 0.0–40.0)

## 2017-07-06 LAB — HEMOGLOBIN A1C: Hgb A1c MFr Bld: 6.2 % (ref 4.6–6.5)

## 2017-07-07 ENCOUNTER — Other Ambulatory Visit: Payer: Self-pay | Admitting: Internal Medicine

## 2017-07-07 DIAGNOSIS — D649 Anemia, unspecified: Secondary | ICD-10-CM

## 2017-07-07 NOTE — Progress Notes (Signed)
Orders placed for add on labs.  

## 2017-07-08 ENCOUNTER — Ambulatory Visit (INDEPENDENT_AMBULATORY_CARE_PROVIDER_SITE_OTHER): Payer: Medicare Other | Admitting: Internal Medicine

## 2017-07-08 ENCOUNTER — Other Ambulatory Visit (INDEPENDENT_AMBULATORY_CARE_PROVIDER_SITE_OTHER): Payer: Medicare Other

## 2017-07-08 ENCOUNTER — Encounter: Payer: Self-pay | Admitting: Internal Medicine

## 2017-07-08 VITALS — BP 130/64 | HR 64 | Temp 98.3°F | Resp 14 | Ht 71.0 in | Wt 199.2 lb

## 2017-07-08 DIAGNOSIS — I509 Heart failure, unspecified: Secondary | ICD-10-CM

## 2017-07-08 DIAGNOSIS — I712 Thoracic aortic aneurysm, without rupture, unspecified: Secondary | ICD-10-CM

## 2017-07-08 DIAGNOSIS — I2510 Atherosclerotic heart disease of native coronary artery without angina pectoris: Secondary | ICD-10-CM | POA: Diagnosis not present

## 2017-07-08 DIAGNOSIS — G7000 Myasthenia gravis without (acute) exacerbation: Secondary | ICD-10-CM | POA: Diagnosis not present

## 2017-07-08 DIAGNOSIS — D649 Anemia, unspecified: Secondary | ICD-10-CM

## 2017-07-08 DIAGNOSIS — E7800 Pure hypercholesterolemia, unspecified: Secondary | ICD-10-CM | POA: Diagnosis not present

## 2017-07-08 DIAGNOSIS — Z23 Encounter for immunization: Secondary | ICD-10-CM

## 2017-07-08 DIAGNOSIS — C679 Malignant neoplasm of bladder, unspecified: Secondary | ICD-10-CM

## 2017-07-08 DIAGNOSIS — G7080 Lambert-Eaton syndrome, unspecified: Secondary | ICD-10-CM

## 2017-07-08 DIAGNOSIS — I10 Essential (primary) hypertension: Secondary | ICD-10-CM | POA: Diagnosis not present

## 2017-07-08 LAB — IBC PANEL
IRON: 73 ug/dL (ref 42–165)
Saturation Ratios: 21.9 % (ref 20.0–50.0)
TRANSFERRIN: 238 mg/dL (ref 212.0–360.0)

## 2017-07-08 LAB — FERRITIN: FERRITIN: 133.9 ng/mL (ref 22.0–322.0)

## 2017-07-08 NOTE — Progress Notes (Signed)
Patient ID: SISTO GRANILLO, male   DOB: September 10, 1926, 83 y.o.   MRN: [id number]   Subjective:    Patient ID: DAREON NUNZIATO, male    DOB: Jun 01, 1926, 83 y.o.   MRN: [id number]  HPI  Patient here for a scheduled follow up.  He reports he is doing relatively well.  Feels breathing is stable.  No increased sob.  No chest pain.  Saw Dr Genevive Bi 04/16/17 for distal aortic arch aneurysm.  Recommended f/u CT in 3 months.  He also saw cardiology 04/23/17.  Stable.  Has f/u planned 07/2017.  Also due to see Dr Nadara Mustard 08/28/17.  No acid reflux.  No abdominal pain.  Bowels moving.  S/p injection for trochanteric bursitis.  Helped.  Seeing ortho.     Past Medical History:  Diagnosis Date  . Allergy   . Anemia   . Cancer ([place]) 11-24-12   bladder. BCG treatments by Dr Jacqlyn Larsen.  . Chicken pox   . Colon polyp   . 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 Essential hypertension, benign   . Hypercholesterolemia   . Myasthenia gravis ([place])   . Shingles 2013   Past Surgical History:  Procedure Laterality Date  . APPENDECTOMY  1958  . CHOLECYSTECTOMY  1979  . HERNIA REPAIR  1970  . TONSILLECTOMY  1958   Family History  Problem Relation Age of Onset  . Lung cancer Sister   . Prostate cancer Brother   . Lung cancer Brother   . Arthritis Unknown        parent  . Colon cancer Neg Hx    Social History   Social History  . Marital status: Married    Spouse name: N/A  . Number of children: N/A  . Years of education: N/A   Social History Main Topics  . Smoking status: Former Smoker    Quit date: 09/29/1958  . Smokeless tobacco: Never Used  . Alcohol use 0.0 oz/week     Comment: occasionally  . Drug use: No  . Sexual activity: No   Other Topics Concern  . None   Social History Narrative   Pt is married with 2 children - 1 son, 1 daughter. Previously self-employed in [employer]    Outpatient Encounter Prescriptions as of 07/08/2017  Medication Sig  . acetaminophen (TYLENOL) 500 MG tablet Take 500 mg  by mouth every 6 (six) hours as needed.  Marland Kitchen aspirin 81 MG tablet Take 81 mg by mouth daily.  . finasteride (PROSCAR) 5 MG tablet Take 5 mg by mouth daily.  . furosemide (LASIX) 20 MG tablet Take 20 mg by mouth daily.  . hydroxypropyl methylcellulose / hypromellose (ISOPTO TEARS / GONIOVISC) 2.5 % ophthalmic solution Place 1 drop into both eyes daily.  . isosorbide mononitrate (IMDUR) 30 MG 24 hr tablet Take 30 mg by mouth daily.  Marland Kitchen lisinopril (PRINIVIL,ZESTRIL) 5 MG tablet Take 5 mg by mouth daily.  . mycophenolate (CELLCEPT) 250 MG capsule Take 1,000 mg by mouth 2 (two) times daily.   . pravastatin (PRAVACHOL) 20 MG tablet Take 1 tablet (20 mg total) by mouth daily.  . prednisoLONE acetate (PRED FORTE) 1 % ophthalmic suspension Place 1 drop into the right eye 4 (four) times daily.  Marland Kitchen pyridostigmine (MESTINON) 60 MG tablet Take 30 mg by mouth 6 (six) times daily.   . tamsulosin (FLOMAX) 0.4 MG CAPS Take 0.4 mg by mouth daily.  . [DISCONTINUED] ipratropium (ATROVENT) 0.06 % nasal spray Place 2 sprays into both  nostrils 2 (two) times daily.  . [DISCONTINUED] traZODone (DESYREL) 50 MG tablet Take 1 tablet (50 mg total) by mouth at bedtime as needed for sleep.   No facility-administered encounter medications on file as of 07/08/2017.     Review of Systems  Constitutional: Negative for appetite change and unexpected weight change.  HENT: Negative for congestion and sinus pressure.   Respiratory: Negative for cough, chest tightness and shortness of breath.   Cardiovascular: Negative for chest pain, palpitations and leg swelling.  Gastrointestinal: Negative for abdominal pain, diarrhea, nausea and vomiting.  Genitourinary: Negative for difficulty urinating and dysuria.  Musculoskeletal: Negative for joint swelling and myalgias.  Skin: Negative for color change and rash.  Neurological: Negative for dizziness, light-headedness and headaches.  Psychiatric/Behavioral: Negative for agitation and  dysphoric mood.       Objective:     Blood pressure rechecked by me:  132/72  Physical Exam  Constitutional: He appears well-developed and well-nourished. No distress.  HENT:  Nose: Nose normal.  Mouth/Throat: Oropharynx is clear and moist.  Neck: Neck supple. No thyromegaly present.  Cardiovascular: Normal rate and regular rhythm.   Pulmonary/Chest: Effort normal and breath sounds normal. No respiratory distress.  Abdominal: Soft. Bowel sounds are normal. There is no tenderness.  Musculoskeletal: He exhibits no edema.  Lymphadenopathy:    He has no cervical adenopathy.  Skin: No rash noted. No erythema.  Psychiatric: He has a normal mood and affect. His behavior is normal.    BP 130/64 (BP Location: Left Arm, Patient Position: Sitting, Cuff Size: Normal)   Pulse 64   Temp 98.3 F (36.8 C) (Oral)   Resp 14   Ht 5\' 11"  (1.803 m)   Wt 199 lb 3.2 oz (90.4 kg)   SpO2 95%   BMI 27.78 kg/m  Wt Readings from Last 3 Encounters:  07/08/17 199 lb 3.2 oz (90.4 kg)  04/16/17 200 lb (90.7 kg)  03/25/17 200 lb 6.4 oz (90.9 kg)     Lab Results  Component Value Date   WBC 5.9 07/06/2017   HGB 12.7 (L) 07/06/2017   HCT 39.3 07/06/2017   PLT 173.0 07/06/2017   GLUCOSE 102 (H) 07/06/2017   CHOL 166 07/06/2017   TRIG 113.0 07/06/2017   HDL 53.50 07/06/2017   LDLDIRECT 143.5 08/26/2013   LDLCALC 90 07/06/2017   ALT 12 07/06/2017   AST 10 07/06/2017   NA 141 07/06/2017   K 4.4 07/06/2017   CL 106 07/06/2017   CREATININE 1.05 07/06/2017   BUN 24 (H) 07/06/2017   CO2 29 07/06/2017   TSH 2.26 09/09/2016   HGBA1C 6.2 07/06/2017    Dg Abdomen 1 View  Result Date: 12/27/2016 CLINICAL DATA:  NG tube placement. EXAM: ABDOMEN - 1 VIEW COMPARISON:  None. FINDINGS: Enteric tube terminates in the left upper abdomen, likely in the proximal to mid gastric body with side hole projecting over the distal esophagus. Gas is visualized in nondilated colon in the upper abdomen. The lower  abdomen was not imaged. Lumbar spondylosis is noted. The lung bases are more fully evaluated on concurrent chest radiograph. IMPRESSION: Enteric tube tip in the stomach with side hole in the distal esophagus. Electronically Signed   By: Logan Bores M.D.   On: 12/27/2016 14:18   Ct Angio Chest Pe W/cm &/or Wo Cm  Result Date: 12/27/2016 CLINICAL DATA:  Dizziness and increased shortness of breath over last 3 weeks EXAM: CT ANGIOGRAPHY CHEST WITH CONTRAST TECHNIQUE: Multidetector CT imaging of the  chest was performed using the standard protocol during bolus administration of intravenous contrast. Multiplanar CT image reconstructions and MIPs were obtained to evaluate the vascular anatomy. CONTRAST:  75 cc Isovue 370 IV COMPARISON:  CT chest 06/13/2016 FINDINGS: Cardiovascular: Atherosclerotic calcifications aorta, great vessels and coronary arteries. Aneurysmal dilatation of the distal aortic arch 4.2 cm transverse. Ascending aorta measures up to 3.6 cm transverse. No evidence of aortic dissection. Pulmonary arteries well opacified and patent. No evidence of pulmonary embolism. Mediastinum/Nodes: Esophagus unremarkable. Base of cervical region normal appearance. Few normal sized mediastinal and RIGHT hilar nodes without definite thoracic adenopathy. Lungs/Pleura: Scattered atelectasis in both lungs with consolidation in LEFT lower lobe. Minimal scattered interstitial prominence which could represent minimal edema or infiltrate. No pleural effusion or pneumothorax. Upper Abdomen: Unremarkable Musculoskeletal: Mild superior endplate compression deformity of a mid thoracic vertebra appears old. Views osseous demineralization. Schmorl's node at superior endplate of Z61. Review of the MIP images confirms the above findings. IMPRESSION: No evidence of pulmonary embolism. 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 Coronary arterial calcification.  Scattered atelectasis in both lungs with minimal interstitial prominence which could represent minimal edema or infiltrate. Recommend semi-annual imaging of the thoracic aorta followup by chest CTA or MRA and referral to cardiothoracic surgery if not already obtained. This recommendation follows 2010 ACCF/AHA/AATS/ACR/ASA/SCA/SCAI/SIR/STS/SVM Guidelines for the Diagnosis and Management of Patients With Thoracic Aortic Disease. Circulation. 2010; 121: W960-A54 Electronically Signed   By: Lavonia Dana M.D.   On: 12/27/2016 13:19   Dg Chest Port 1 View  Result Date: 12/28/2016 CLINICAL DATA:  Respiratory failure. EXAM: PORTABLE CHEST 1 VIEW COMPARISON:  Yesterday. FINDINGS: Endotracheal tube in satisfactory position. Nasogastric tube tip in the mid stomach. The side hole is not well visualized, most likely in the proximal stomach. Left subclavian catheter tip most likely at the lateral aspect of the origin of the superior vena cava. Stable enlarged cardiac silhouette. No significant change in left lower lobe airspace opacity. Small amount of linear density in the right lower lung zone. Stable mild prominence of the interstitial markings. Aortic arch calcification. IMPRESSION: 1. Stable left lower lobe atelectasis or pneumonia. 2. Minimal right basilar atelectasis. 3. Stable cardiomegaly, mild chronic interstitial lung disease and aortic atherosclerosis. Electronically Signed   By: Claudie Revering M.D.   On: 12/28/2016 07:16   Dg Chest Portable 1 View  Result Date: 12/27/2016 CLINICAL DATA:  Central line placement EXAM: PORTABLE CHEST 1 VIEW COMPARISON:  Portable exam 1457 hours compared to 1355 hours FINDINGS: Tip of endotracheal tube projects 6.3 cm above carina, tip directed toward RIGHT lateral wall. Nasogastric tube extends into stomach. LEFT subclavian central venous catheter tip projecting over SVC. External pacing leads present. Enlargement of cardiac silhouette. Atherosclerotic calcification aorta. Bibasilar  atelectasis with questionable perihilar infiltrate versus edema. No pleural effusion or pneumothorax. IMPRESSION: No pneumothorax following central line placement. Bibasilar atelectasis with question mild perihilar infiltrate or edema. Electronically Signed   By: Lavonia Dana M.D.   On: 12/27/2016 15:15   Dg Chest Port 1 View  Result Date: 12/27/2016 CLINICAL DATA:  Status post intubation. EXAM: PORTABLE CHEST 1 VIEW COMPARISON:  12/27/2016 at 10:42 a.m. FINDINGS: Endotracheal tube tip abuts the right lateral aspect of the thoracic trachea, 3 cm above the carina. Nasogastric tube passes below the diaphragm and into the stomach. Persistent lung base opacity is noted consistent with atelectasis. No new lung abnormalities. No pneumothorax. IMPRESSION: 1. Endotracheal tube tip projects 3  cm above the carina. 2. Nasogastric tube is well positioned passing below the diaphragm well into the stomach. Electronically Signed   By: Lajean Manes M.D.   On: 12/27/2016 14:21   Dg Chest Port 1 View  Result Date: 12/27/2016 CLINICAL DATA:  Chest congestion, nonproductive cough and shortness of breath. EXAM: PORTABLE CHEST 1 VIEW COMPARISON:  06/16/2014. FINDINGS: Stable borderline enlarged cardiac silhouette. Aortic arch calcifications. Mildly elevated left hemidiaphragm. Resolved bibasilar atelectasis with interval minimal linear density laterally. Minimally prominent interstitial markings. Diffuse osteopenia. IMPRESSION: 1. Minimal interstitial pneumonitis or edema. 2. Minimal left basilar linear atelectasis or scarring. 3. Aortic atherosclerosis. Electronically Signed   By: Claudie Revering M.D.   On: 12/27/2016 11:08       Assessment & Plan:   Problem List Items Addressed This Visit    Anemia    Most recent cbc revealed slightly decreased hgb. Follow cbc.        Relevant Orders   CBC with Differential/Platelet   Vitamin B12   Aortic aneurysm (HCC)    Saw Dr Genevive Bi 04/16/17.  Recommended f/u CT scan in 3 months.          Bladder cancer ([place])    Followed by Dr Jacqlyn Larsen.  Stable.        CAD (coronary artery disease)    Continue risk factor modification.  Followed by cardiology.  Stable.        CHF (congestive heart failure) (HCC)    No evidence of volume overload on exam.  Same medication regimen.  Followed by cardiology.        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  Follow cbc.  Recent hgb slightly decreased.  Recheck over the next several weeks.  White count wnl.        Hypercholesterolemia    On pravastatin.  Low cholesterol diet and exercise.  Follow lipid panel and liver function tests.        Hypertension, benign    Blood pressure under good control.  Continue same medication regimen.  Follow pressures.  Follow metabolic panel.        Myasthenia gravis without exacerbation ([place])    Followed by Dr Nadara Mustard.  Stable.  Has f/u planned 08/28/17.         Other Visit Diagnoses    Need for pneumococcal vaccination    -  Primary       [name], MD

## 2017-07-11 ENCOUNTER — Encounter: Payer: Self-pay | Admitting: Internal Medicine

## 2017-07-11 NOTE — Assessment & Plan Note (Signed)
Followed by Dr Cope.  Stable.  

## 2017-07-11 NOTE — Assessment & Plan Note (Signed)
Saw Dr Genevive Bi 04/16/17.  Recommended f/u CT scan in 3 months.

## 2017-07-11 NOTE — Assessment & Plan Note (Signed)
Followed by Dr Nadara Mustard.  Follow cbc.  Recent hgb slightly decreased.  Recheck over the next several weeks.  White count wnl.

## 2017-07-11 NOTE — Assessment & Plan Note (Signed)
On pravastatin.  Low cholesterol diet and exercise.  Follow lipid panel and liver function tests.   

## 2017-07-11 NOTE — Assessment & Plan Note (Signed)
Blood pressure under good control.  Continue same medication regimen.  Follow pressures.  Follow metabolic panel.   

## 2017-07-11 NOTE — Assessment & Plan Note (Signed)
Followed by Dr Nadara Mustard.  Stable.  Has f/u planned 08/28/17.

## 2017-07-11 NOTE — Assessment & Plan Note (Signed)
Most recent cbc revealed slightly decreased hgb. Follow cbc.

## 2017-07-11 NOTE — Assessment & Plan Note (Signed)
No evidence of volume overload on exam.  Same medication regimen.  Followed by cardiology.

## 2017-07-11 NOTE — Assessment & Plan Note (Signed)
Continue risk factor modification.  Followed by cardiology.  Stable.   

## 2017-07-24 ENCOUNTER — Ambulatory Visit: Payer: Medicare Other | Admitting: Cardiothoracic Surgery

## 2017-07-24 ENCOUNTER — Ambulatory Visit: Payer: Medicare Other

## 2017-07-27 DIAGNOSIS — H34231 Retinal artery branch occlusion, right eye: Secondary | ICD-10-CM | POA: Diagnosis not present

## 2017-07-30 ENCOUNTER — Ambulatory Visit
Admission: RE | Admit: 2017-07-30 | Discharge: 2017-07-30 | Disposition: A | Discharge status: Home / Self Care | Payer: Medicare Other | Source: Ambulatory Visit | Attending: Cardiothoracic Surgery | Admitting: Cardiothoracic Surgery

## 2017-07-30 DIAGNOSIS — R911 Solitary pulmonary nodule: Secondary | ICD-10-CM | POA: Diagnosis not present

## 2017-07-30 DIAGNOSIS — I712 Thoracic aortic aneurysm, without rupture, unspecified: Secondary | ICD-10-CM

## 2017-07-30 DIAGNOSIS — I700 Atherosclerosis of aorta: Secondary | ICD-10-CM | POA: Insufficient documentation

## 2017-07-30 DIAGNOSIS — J479 Bronchiectasis, uncomplicated: Secondary | ICD-10-CM | POA: Insufficient documentation

## 2017-07-30 DIAGNOSIS — I2510 Atherosclerotic heart disease of native coronary artery without angina pectoris: Secondary | ICD-10-CM | POA: Insufficient documentation

## 2017-07-31 ENCOUNTER — Encounter: Payer: Self-pay | Admitting: Cardiothoracic Surgery

## 2017-07-31 ENCOUNTER — Ambulatory Visit (INDEPENDENT_AMBULATORY_CARE_PROVIDER_SITE_OTHER): Payer: Medicare Other | Admitting: Cardiothoracic Surgery

## 2017-07-31 VITALS — BP 118/61 | HR 61 | Temp 98.1°F

## 2017-07-31 DIAGNOSIS — R911 Solitary pulmonary nodule: Secondary | ICD-10-CM | POA: Diagnosis not present

## 2017-07-31 NOTE — Patient Instructions (Signed)
We will call you back in a year to make sure that you have your CT Scan Chest done and then follow up with Dr. Genevive Bi.

## 2017-07-31 NOTE — Progress Notes (Signed)
  Patient ID: [name], male   DOB: 06/25/1926, 83 y.o.   MRN: [id number]  HISTORY: He returns today in follow-up.  On his last CT he had a small pulmonary nodule as well as his dilated aorta.  He has no complaints today.  He denies any fevers or chills.  He denies any cough.  His only complaint is that he has some issues regarding the vision in his right eye and he is going to see someone for possible corneal transplant next week.   Vitals:   07/31/17 1015  BP: 118/61  Pulse: 61  Temp: 98.1 F (36.7 C)     EXAM:    Resp: Lungs are clear bilaterally.  No respiratory distress, normal effort. Heart: Irregular egular with loud systolic murmur Abd:  Abdomen is soft, non distended and non tender. No masses are palpable.  There is no rebound and no guarding.  Neurological: Alert and oriented to person, place, and time. Coordination normal.  Skin: Skin is warm and dry. No rash noted. No diaphoretic. No erythema. No pallor.  Psychiatric: Normal mood and affect. Normal behavior. Judgment and thought content normal.    ASSESSMENT: I have independently reviewed his chest CT.  There is no change in the aorta.  There is no change in the small pulmonary nodule.   PLAN:   I would like to see him back again in 1 year.  We will obtain a noncontrast chest CT at that time.    Nestor Lewandowsky, MD

## 2017-08-03 DIAGNOSIS — H18421 Band keratopathy, right eye: Secondary | ICD-10-CM | POA: Diagnosis not present

## 2017-08-05 ENCOUNTER — Other Ambulatory Visit (INDEPENDENT_AMBULATORY_CARE_PROVIDER_SITE_OTHER): Payer: Medicare Other

## 2017-08-05 DIAGNOSIS — D649 Anemia, unspecified: Secondary | ICD-10-CM | POA: Diagnosis not present

## 2017-08-05 LAB — CBC WITH DIFFERENTIAL/PLATELET
BASOS ABS: 0 10*3/uL (ref 0.0–0.1)
Basophils Relative: 0.4 % (ref 0.0–3.0)
EOS PCT: 1.7 % (ref 0.0–5.0)
Eosinophils Absolute: 0.1 10*3/uL (ref 0.0–0.7)
HEMATOCRIT: 40 % (ref 39.0–52.0)
Hemoglobin: 12.7 g/dL — ABNORMAL LOW (ref 13.0–17.0)
LYMPHS PCT: 19.7 % (ref 12.0–46.0)
Lymphs Abs: 1.1 10*3/uL (ref 0.7–4.0)
MCHC: 31.7 g/dL (ref 30.0–36.0)
MCV: 95.6 fl (ref 78.0–100.0)
MONOS PCT: 7.6 % (ref 3.0–12.0)
Monocytes Absolute: 0.4 10*3/uL (ref 0.1–1.0)
NEUTROS ABS: 3.8 10*3/uL (ref 1.4–7.7)
NEUTROS PCT: 70.6 % (ref 43.0–77.0)
PLATELETS: 200 10*3/uL (ref 150.0–400.0)
RBC: 4.19 Mil/uL — AB (ref 4.22–5.81)
RDW: 14.9 % (ref 11.5–15.5)
WBC: 5.4 10*3/uL (ref 4.0–10.5)

## 2017-08-05 LAB — VITAMIN B12: Vitamin B-12: 549 pg/mL (ref 211–911)

## 2017-08-10 ENCOUNTER — Other Ambulatory Visit: Payer: Self-pay

## 2017-08-10 ENCOUNTER — Telehealth: Payer: Self-pay | Admitting: Internal Medicine

## 2017-08-10 ENCOUNTER — Ambulatory Visit: Payer: Self-pay | Admitting: *Deleted

## 2017-08-10 ENCOUNTER — Emergency Department: Payer: Medicare Other

## 2017-08-10 ENCOUNTER — Emergency Department
Admission: EM | Admit: 2017-08-10 | Discharge: 2017-08-10 | Disposition: A | Discharge status: Home / Self Care | Payer: Medicare Other | Attending: Emergency Medicine | Admitting: Emergency Medicine

## 2017-08-10 DIAGNOSIS — I5022 Chronic systolic (congestive) heart failure: Secondary | ICD-10-CM | POA: Insufficient documentation

## 2017-08-10 DIAGNOSIS — N50811 Right testicular pain: Secondary | ICD-10-CM

## 2017-08-10 DIAGNOSIS — I2510 Atherosclerotic heart disease of native coronary artery without angina pectoris: Secondary | ICD-10-CM | POA: Insufficient documentation

## 2017-08-10 DIAGNOSIS — Z87891 Personal history of nicotine dependence: Secondary | ICD-10-CM | POA: Diagnosis not present

## 2017-08-10 DIAGNOSIS — Z7982 Long term (current) use of aspirin: Secondary | ICD-10-CM | POA: Insufficient documentation

## 2017-08-10 DIAGNOSIS — I110 Hypertensive heart disease with heart failure: Secondary | ICD-10-CM | POA: Insufficient documentation

## 2017-08-10 DIAGNOSIS — Z9049 Acquired absence of other specified parts of digestive tract: Secondary | ICD-10-CM | POA: Insufficient documentation

## 2017-08-10 DIAGNOSIS — N3001 Acute cystitis with hematuria: Secondary | ICD-10-CM | POA: Insufficient documentation

## 2017-08-10 DIAGNOSIS — Z8551 Personal history of malignant neoplasm of bladder: Secondary | ICD-10-CM | POA: Insufficient documentation

## 2017-08-10 DIAGNOSIS — Z79899 Other long term (current) drug therapy: Secondary | ICD-10-CM | POA: Insufficient documentation

## 2017-08-10 DIAGNOSIS — N433 Hydrocele, unspecified: Secondary | ICD-10-CM | POA: Diagnosis not present

## 2017-08-10 DIAGNOSIS — N492 Inflammatory disorders of scrotum: Secondary | ICD-10-CM | POA: Insufficient documentation

## 2017-08-10 LAB — URINALYSIS, COMPLETE (UACMP) WITH MICROSCOPIC
BILIRUBIN URINE: NEGATIVE
Glucose, UA: NEGATIVE mg/dL
Ketones, ur: 5 mg/dL — AB
Nitrite: POSITIVE — AB
PH: 5 (ref 5.0–8.0)
Protein, ur: 30 mg/dL — AB
SPECIFIC GRAVITY, URINE: 1.023 (ref 1.005–1.030)
SQUAMOUS EPITHELIAL / LPF: NONE SEEN

## 2017-08-10 LAB — BASIC METABOLIC PANEL
Anion gap: 12 (ref 5–15)
BUN: 20 mg/dL (ref 6–20)
CO2: 26 mmol/L (ref 22–32)
Calcium: 9.1 mg/dL (ref 8.9–10.3)
Chloride: 101 mmol/L (ref 101–111)
Creatinine, Ser: 0.91 mg/dL (ref 0.61–1.24)
GFR calc Af Amer: >60 mL/min (ref >60)
Glucose, Bld: 117 mg/dL — ABNORMAL HIGH (ref 65–99)
Potassium: 4.5 mmol/L (ref 3.5–5.1)
SODIUM: 139 mmol/L (ref 135–145)

## 2017-08-10 LAB — CBC
HCT: 39.8 % — ABNORMAL LOW (ref 40.0–52.0)
Hemoglobin: 13 g/dL (ref 13.0–18.0)
MCH: 30.5 pg (ref 26.0–34.0)
MCHC: 32.6 g/dL (ref 32.0–36.0)
MCV: 93.5 fL (ref 80.0–100.0)
PLATELETS: 181 10*3/uL (ref 150–440)
RBC: 4.25 MIL/uL — ABNORMAL LOW (ref 4.40–5.90)
RDW: 14.3 % (ref 11.5–14.5)
WBC: 11 10*3/uL — ABNORMAL HIGH (ref 3.8–10.6)

## 2017-08-10 MED ORDER — CEFTRIAXONE SODIUM IN DEXTROSE 20 MG/ML IV SOLN
INTRAVENOUS | Status: AC
  Filled 2017-08-10: qty 50

## 2017-08-10 MED ORDER — DEXTROSE 5 % IV SOLN
1.0000 g | Freq: Once | INTRAVENOUS | Status: AC
  Administered 2017-08-10: 1 g via INTRAVENOUS
  Filled 2017-08-10: qty 10

## 2017-08-10 MED ORDER — VANCOMYCIN HCL 10 G IV SOLR
1750.0000 mg | Freq: Once | INTRAVENOUS | Status: AC
  Administered 2017-08-10: 1750 mg via INTRAVENOUS
  Filled 2017-08-10: qty 1750

## 2017-08-10 MED ORDER — SODIUM CHLORIDE 0.9 % IV BOLUS (SEPSIS)
1000.0000 mL | Freq: Once | INTRAVENOUS | Status: AC
  Administered 2017-08-10: 1000 mL via INTRAVENOUS

## 2017-08-10 MED ORDER — CEPHALEXIN 500 MG PO CAPS: 500.0000 mg | ORAL_CAPSULE | Freq: Four times a day (QID) | ORAL | 0 refills | Status: AC

## 2017-08-10 MED ORDER — OXYCODONE HCL 5 MG PO TABS: 5.0000 mg | ORAL_TABLET | Freq: Four times a day (QID) | ORAL | 0 refills | Status: DC | PRN

## 2017-08-10 MED ORDER — FENTANYL CITRATE (PF) 100 MCG/2ML IJ SOLN
75.0000 ug | Freq: Once | INTRAMUSCULAR | Status: AC
  Administered 2017-08-10: 75 ug via INTRAVENOUS
  Filled 2017-08-10: qty 2

## 2017-08-10 NOTE — Telephone Encounter (Signed)
FYI

## 2017-08-10 NOTE — Discharge Instructions (Signed)
Please drink plenty of fluids stay well-hydrated and to help with your urine infection.  Please take the entire course of antibiotics, which should treat your urine infection and your skin infection, even if you are feeling better.  Please keep your appointment to see Dr. Jacqlyn Larsen tomorrow.  For mild to moderate pain, you may take Tylenol or Motrin.  For severe pain, you may take oxycodone.  Do not drive within 8 hours of taking oxycodone, and please be careful when you are walking around as this medication can make you sleepy and unstable.  Return to the emergency department if you develop fever, lightheadedness or fainting, severe pain, or any other symptoms concerning to you.

## 2017-08-10 NOTE — Telephone Encounter (Signed)
Have re faxed and l/m for beth

## 2017-08-10 NOTE — ED Notes (Signed)
ED Provider at bedside. 

## 2017-08-10 NOTE — Telephone Encounter (Signed)
Copied from [place] [phone] [id number]. Topic: Quick Communication - See Telephone Encounter >> Aug 10, 2017  1:03 PM Vernona Rieger wrote: CRM for notification. See Telephone encounter for:   Patient called back regarding the blood in his urine, his daughter got on the phone to see what the update was for him going to see the urologist bc his PCP was not in the office. The office called the urologist to see if they could get him & they told me to tell the patient that they would call him back with the answers & if not they needed to go to urgent care. Rip Harbour our nurse in our triage advised him of this if he couldn't be seen at urologist. Rip Harbour triaged him this am. See Triage in encounters 08/10/17.

## 2017-08-10 NOTE — ED Notes (Signed)
Skin pen used to mark erythema over pubic bone

## 2017-08-10 NOTE — Telephone Encounter (Signed)
Patient is going to [facility] clinic now and will keep appointment scheduled with dr. Jacqlyn Larsen for tomorrow morning at 10 am.

## 2017-08-10 NOTE — ED Triage Notes (Addendum)
Pt c/o right testicle pain that began last night. States pain and swelling to right testicle only. Pt alert and oriented X4, active, cooperative, pt in NAD. RR even and unlabored, color WNL.   Daughter notes pt complained of bloody urine last night, burning with urination and hx of bladder cancer.

## 2017-08-10 NOTE — Telephone Encounter (Signed)
  Reason for Disposition . [1] Unable to urinate (or only a few drops) > 4 hours AND [2] bladder feels very full (e.g., palpable bladder or strong urge to urinate)  Answer Assessment - Initial Assessment Questions 1. COLOR of URINE: "Describe the color of the urine."  (e.g., tea-colored, pink, red, blood clots, bloody)     Dark red with clots, today lighter colored with less blood 2. ONSET: "When did the bleeding start?"      yesterday 3. EPISODES: "How many times has there been blood in the urine?" or "How many times today?"     Urge to go every 30 minutes but has enlarged prostate 4. PAIN with URINATION: "Is there any pain with passing your urine?" If so, ask: "How bad is the pain?"  (Scale 1-10; or mild, moderate, severe)    - MILD - complains slightly about urination hurting    - MODERATE - interferes with normal activities      - SEVERE - excruciating, unwilling or unable to urinate because of the pain      No pain with passing urine . Rt testicle swollen and sore to touch 5. FEVER: "Do you have a fever?" If so, ask: "What is your temperature, how was it measured, and when did it start?"    no 6. ASSOCIATED SYMPTOMS: "Are you passing urine more frequently than usual?"     Yes  7. OTHER SYMPTOMS: "Do you have any other symptoms?" (e.g., back/flank pain, abdominal pain, vomiting)    Pain in the back below the area 8. PREGNANCY: "Is there any chance you are pregnant?" "When was your last menstrual period?"     na  Protocols used: URINE - BLOOD IN-A-AH

## 2017-08-10 NOTE — Telephone Encounter (Signed)
Copied from [place] [id number]. Topic: Quick Communication - See Telephone Encounter >> Aug 10, 2017  3:01 PM Cleaster Corin, [place] wrote: CRM for notification. See Telephone encounter for:   08/10/17. Beth from [facility] at Dr. Michel Bickers office called to let Dr. Nicki Reaper know that they did not receive fax with pts. Lab work  any questions please call Beth at [phone]

## 2017-08-10 NOTE — Telephone Encounter (Signed)
Called Dr. Jacqlyn Larsen Office was advised patient can be seen tomorrow at 10 am , but no physicians available today in office advised patient in pain unable to pass water needs to be evaluated today patient agreed and is going to [facility] and will keep appointment for tomorrow with Dr. Jacqlyn Larsen also.

## 2017-08-10 NOTE — ED Notes (Signed)

## 2017-08-10 NOTE — Telephone Encounter (Signed)
Agree.  Looks like patient went to ED per your instructions

## 2017-08-10 NOTE — ED Provider Notes (Signed)
[facility] Emergency Department Provider Note  ____________________________________________  Time seen: Approximately 5:18 PM  I have reviewed the triage vital signs and the nursing notes.   HISTORY  Chief Complaint Testicle Pain    HPI [name] is a 83 y.o. male a history of remote bladder cancer followed at [facility], multiple chronic illnesses, presenting w/ hematuria, dysuria, R testicular swelling and pain.  The patient reports that for the past 2-3 days, he has had hematuria, which was originally definitely bloody and has almost completely resolved at this point.  He has had associated dysuria, mild urinary dribbling, and urinary frequency without any abdominal pain, flank pain, nausea or vomiting, fever or chills.  The patient has also noted right testicular swelling and pain, without any penile discharge.  No reported trauma.  Past Medical History:  Diagnosis Date  . Abnormal chest CT 03/28/2017  . Allergy   . Anemia   . Angina pectoris ([place]) 04/02/2011  . Aortic aneurysm ([place]) 03/28/2017   Saw Dr Genevive Bi 04/16/17 - recommended f/u CT in 3 months.    . Atherosclerosis of both carotid arteries 07/17/2014  . Benign lipomatous neoplasm of skin and subcutaneous tissue of left arm 10/04/2013  . Bilateral carotid artery stenosis 08/30/2014   Overview:  Less than 50% 2015  . Bladder cancer ([place]) 03/05/2013  . CAD (coronary artery disease) 06/18/2014  . Cancer ([place]) 11-24-12   bladder. BCG treatments by Dr Jacqlyn Larsen.  . Chicken pox   . Chronic prostatitis 09/30/2012  . Colon polyp   . Congestive heart failure ([place]) 04/20/2014   Overview:  Overview:  With anterior mi and moderate lv dyfunction ef 35%  . Dizziness 12/22/2016  . 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 Elevated prostate specific antigen (PSA) 09/30/2012  . Essential hypertension, benign   . Gross hematuria 09/30/2012  . Herpes zoster 11/28/2011   Overview:  with  ocular involvement OD  . Hypercholesterolemia   . Hypertension, benign 04/02/2011  . Hypotension   . Moderate mitral insufficiency 05/18/2015  . Moderate tricuspid insufficiency 05/18/2015  . Myasthenia gravis ([place])   . Myasthenia gravis without exacerbation ([place]) 03/31/2011  . Shingles 2013    Patient Active Problem List   Diagnosis Date Noted  . Aortic aneurysm ([place]) 03/28/2017  . Abnormal chest CT 03/28/2017  . Hypotension   . Dizziness 12/22/2016  . Leg weakness 03/11/2016  . Skin lesion of cheek 09/11/2015  . Moderate mitral insufficiency 05/18/2015  . Moderate tricuspid insufficiency 05/18/2015  . Benign essential hypertension 04/25/2015  . Rash 03/10/2015  . Health care maintenance 03/10/2015  . Sinusitis 08/30/2014  . Bilateral carotid artery stenosis 08/30/2014  . Atherosclerosis of both carotid arteries 07/17/2014  . Chronic systolic CHF (congestive heart failure), NYHA class 3 ([place]) 07/17/2014  . CAD (coronary artery disease) 06/18/2014  . CHF (congestive heart failure) ([place]) 04/20/2014  . Congestive heart failure ([place]) 04/20/2014  . SOB (shortness of breath) 03/10/2014  . Abnormal EKG 03/10/2014  . Mixed hyperlipidemia 03/10/2014  . Benign lipomatous neoplasm of skin and subcutaneous tissue of left arm 10/04/2013  . Arm skin lesion, left 09/04/2013  . History of shingles 09/04/2013  . Bladder cancer ([place]) 03/05/2013  . Chronic prostatitis 09/30/2012  . Elevated prostate specific antigen (PSA) 09/30/2012  . Family history of malignant neoplasm of prostate 09/30/2012  . Gross hematuria 09/30/2012  . Incomplete emptying of bladder 09/30/2012  . Anemia 08/30/2012  . 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  [place]) 08/30/2012  . Hypercholesterolemia 08/30/2012  . Herpes zoster 11/28/2011  . Angina pectoris ([place]) 04/02/2011  . Hypertension, benign 04/02/2011  . Myasthenia gravis without exacerbation ([place]) 03/31/2011    Past Surgical History:  Procedure Laterality Date  .  APPENDECTOMY  1958  . CHOLECYSTECTOMY  1979  . HERNIA REPAIR  1970  . TONSILLECTOMY  1958    Current Outpatient Rx  . Order #: [id number] Class: Historical Med  . Order #: [id number] Class: Historical Med  . Order #: [id number] Class: Print  . Order #: [id number] Class: Historical Med  . Order #: [id number] Class: Historical Med  . Order #: [id number] Class: Historical Med  . Order #: [id number] Class: Historical Med  . Order #: [id number] Class: Historical Med  . Order #: [id number] Class: Historical Med  . Order #: [id number] Class: Print  . Order #: [id number] Class: Normal  . Order #: [id number] Class: Historical Med  . Order #: [id number] Class: Historical Med  . Order #: [id number] Class: Historical Med    Allergies Beta adrenergic blockers; Calcium channel blockers; Ciprofloxacin; and Sulfa antibiotics  Family History  Problem Relation Age of Onset  . Lung cancer Sister   . Prostate cancer Brother   . Lung cancer Brother   . Arthritis Unknown        parent  . Colon cancer Neg Hx     Social History Social History   Tobacco Use  . Smoking status: Former Smoker    Last attempt to quit: 09/29/1958    Years since quitting: 58.9  . Smokeless tobacco: Never Used  Substance Use Topics  . Alcohol use: Yes    Alcohol/week: 0.0 oz    Comment: occasionally  . Drug use: No    Review of Systems Constitutional: No fever/chills. No lightheadedness or syncope. ENT: No sore throat. No congestion or rhinorrhea. Cardiovascular:  Denies palpitations. Respiratory: Denies shortness of breath.  No cough. Gastrointestinal: No abdominal pain.  No nausea, no vomiting.  No diarrhea.  No constipation. Genitourinary: + for hematuria, dysuria, R testicular pain and swelling. Musculoskeletal: Negative for back pain. Skin: Negative for rash. Neurological: Negative for headaches. No focal numbness, tingling or weakness.     ____________________________________________   PHYSICAL EXAM:  VITAL SIGNS: ED Triage  Vitals  Enc Vitals Group     BP 08/10/17 1429 (!) 122/51     Pulse Rate 08/10/17 1429 79     Resp 08/10/17 1429 16     Temp 08/10/17 1429 98.7 F (37.1 C)     Temp Source 08/10/17 1429 Oral     SpO2 08/10/17 1429 96 %     Weight 08/10/17 1430 198 lb (89.8 kg)     Height 08/10/17 1430 5\' 10"  (1.778 m)     Head Circumference --      Peak Flow --      Pain Score 08/10/17 1429 8     Pain Loc --      Pain Edu? --      Excl. in [place]? --     Constitutional: Alert and oriented. Chronically ill appearing and in no acute distress. Answers questions appropriately. Eyes: Conjunctivae are normal.  EOMI. No scleral icterus. Head: Atraumatic. Nose: No congestion/rhinnorhea. Mouth/Throat: Mucous membranes are moist.  Neck: No stridor.  Supple.  No JVD. Cardiovascular: Normal rate, regular rhythm. No murmurs, rubs or gallops.  Respiratory: Normal respiratory effort.  No accessory muscle use or retractions. Lungs CTAB.  No wheezes, rales or ronchi. Gastrointestinal: Soft, nontender and nondistended.  No guarding or rebound.  No peritoneal signs.  GU: Normal appearing uncircumcised penis w/o any lesions or discharge.  The L testicle has a normal lie and is w/o palpable mass or tenderness to palpation.  The right testicle has a normal lie but is more full and mildly indurated w/o fluctuance; there is a palpable asymmetric mass posteriorly.  The pt has no L inguinal hernia but is unable to tolerate R inguinal hernia eval.  There is overlying erythema and warmth on the skin just above the base of the shaft of the penis, approximately 2cm surrounding area.  No crepitus. Musculoskeletal: No LE edema. No ttp in the calves or palpable cords.  Negative Homan's sign. Neurologic:  A&Ox3.  Speech is clear.  Face and smile are symmetric.  EOMI.  Moves all extremities well. Skin:  Skin is warm, dry. Psychiatric: Mood and affect are normal. Speech and behavior are normal.  Normal  judgement.  ____________________________________________   LABS (all labs ordered are listed, but only abnormal results are displayed)  Labs Reviewed  URINALYSIS, COMPLETE (UACMP) WITH MICROSCOPIC - Abnormal; Notable for the following components:      Result Value   Color, Urine YELLOW (*)    APPearance CLOUDY (*)    Hgb urine dipstick MODERATE (*)    Ketones, ur 5 (*)    Protein, ur 30 (*)    Nitrite POSITIVE (*)    Leukocytes, UA LARGE (*)    Bacteria, UA MANY (*)    All other components within normal limits  CBC - Abnormal; Notable for the following components:   WBC 11.0 (*)    RBC 4.25 (*)    HCT 39.8 (*)    All other components within normal limits  BASIC METABOLIC PANEL - Abnormal; Notable for the following components:   Glucose, Bld 117 (*)    All other components within normal limits  URINE CULTURE   ____________________________________________  EKG  Not indicated ____________________________________________  RADIOLOGY  US Scrotum  Result Date: 08/10/2017 CLINICAL DATA:  Right testicular pain for the past day. History of bladder malignancy EXAM: SCROTAL ULTRASOUND DOPPLER ULTRASOUND OF THE TESTICLES TECHNIQUE: Complete ultrasound examination of the testicles, epididymis, and other scrotal structures was performed. Color and spectral Doppler ultrasound were also utilized to evaluate blood flow to the testicles. COMPARISON:  None in PACs FINDINGS: Right testicle Measurements: 3.7 x 2.7 x 2.5 cm. No mass or microlithiasis visualized. Left testicle Measurements: 3.3 x 1.9 x 3.0 cm. No mass or microlithiasis visualized. Right epididymis: There is an epididymal cyst demonstrating internal echoes measuring 6 x 5 x 64mm. There are internal echoes within it. It is not hypervascular along its margins. Left epididymis: There is a simple appearing epididymal cyst measuring 8 x 5 x 7 mm. Hydrocele: There bilateral hydroceles. On the right the hydrocele is complex in appearance and  is larger than that on the left. Varicocele:  There is a probable varicocele on the right. Pulsed Doppler interrogation of both testes demonstrates normal low resistance arterial and venous waveforms bilaterally. IMPRESSION: There is no evidence of orchitis,testicular torsion, or acute epididymitis. There is a complex appearing epididymal cyst on the right. There are bilateral hydroceles greatest on the right. Internal echoes are present within the right-sided hydrocele. Probable varicocele on the right. Electronically Signed   By: David  Martinique M.D.   On: 08/10/2017 15:38   [place] Scrotom Doppler  Result Date: 08/10/2017 CLINICAL DATA:  Right testicular pain for the past day. History of bladder malignancy EXAM: SCROTAL ULTRASOUND DOPPLER ULTRASOUND OF THE TESTICLES  TECHNIQUE: Complete ultrasound examination of the testicles, epididymis, and other scrotal structures was performed. Color and spectral Doppler ultrasound were also utilized to evaluate blood flow to the testicles. COMPARISON:  None in PACs FINDINGS: Right testicle Measurements: 3.7 x 2.7 x 2.5 cm. No mass or microlithiasis visualized. Left testicle Measurements: 3.3 x 1.9 x 3.0 cm. No mass or microlithiasis visualized. Right epididymis: There is an epididymal cyst demonstrating internal echoes measuring 6 x 5 x 74mm. There are internal echoes within it. It is not hypervascular along its margins. Left epididymis: There is a simple appearing epididymal cyst measuring 8 x 5 x 7 mm. Hydrocele: There bilateral hydroceles. On the right the hydrocele is complex in appearance and is larger than that on the left. Varicocele:  There is a probable varicocele on the right. Pulsed Doppler interrogation of both testes demonstrates normal low resistance arterial and venous waveforms bilaterally. IMPRESSION: There is no evidence of orchitis,testicular torsion, or acute epididymitis. There is a complex appearing epididymal cyst on the right. There are bilateral  hydroceles greatest on the right. Internal echoes are present within the right-sided hydrocele. Probable varicocele on the right. Electronically Signed   By: David  Martinique M.D.   On: 08/10/2017 15:38    ____________________________________________   PROCEDURES  Procedure(s) performed: None  Procedures  Critical Care performed: No ____________________________________________   INITIAL IMPRESSION / ASSESSMENT AND PLAN / ED COURSE  Pertinent labs & imaging results that were available during my care of the patient were reviewed by me and considered in my medical decision making (see chart for details).  83 y.o. w/ hx of remote bladder ca presenting w/ hematuria, now improving, dysuria, mild urinary dribbling, and right testicular pain.  Overall, the patient is hemodynamically stable and afebrile.  His laboratory studies do show a UTI, with many bacteria, blood, white blood cells and nitrites that are positive.  We will plan to treat him intravenously with Rocephin and intravenous fluids.  His basic laboratory studies are pending at this time.  He has an ultrasound which shows: There is no evidence of orchitis,testicular torsion, or acute epididymitis. There is a complex appearing epididymal cyst on the right.  There are bilateral hydroceles greatest on the right. Internal echoes are present within the right-sided hydrocele.  Probable varicocele on the right.  The patient's cyst is palpable on examination, but I am also concerned about the induration of the scrotal sac and the erythema that I see above the base of the penis.  It is possible that he has cellulitis.  Necrotizing fasciitis is much less likely given he is afebrile, nontoxic.  I will plan to use an antibiotic that would cover UTI and skin infection; he will receive a dose of vancomycin and rocephin here and be discharged on oral antibiotics if the remainder of his labs and clinical course are reassuring.  I have attempted to  page Dr. Jacqlyn Larsen, the patient's primary urologist at [facility], and him awaiting a return call at this time.  ----------------------------------------- 6:58 PM on 08/10/2017 -----------------------------------------  The patient continues to improve clinically, and his pain is better controlled than upon arrival.  He is hemodynamically stable and remains afebrile.  I have been unable to talk with his primary urologist, Dr. Jacqlyn Larsen, but the patient has an appointment with him tomorrow.  The patient's laboratory studies are reassuring with a white blood cell count of 11, which is expected given his clinical course, and a reassuring creatinine of 0.91.  At this time, the patient  is stable for discharge.  Return precautions as well as follow-up instructions have been discussed. ____________________________________________  FINAL CLINICAL IMPRESSION(S) / ED DIAGNOSES  Final diagnoses:  Acute cystitis with hematuria  Cellulitis, scrotum         NEW MEDICATIONS STARTED DURING THIS VISIT:  This SmartLink is deprecated. Use AVSMEDLIST instead to display the medication list for a patient.    Eula Listen, MD 08/10/17 1859

## 2017-08-11 DIAGNOSIS — N401 Enlarged prostate with lower urinary tract symptoms: Secondary | ICD-10-CM | POA: Diagnosis not present

## 2017-08-11 DIAGNOSIS — N433 Hydrocele, unspecified: Secondary | ICD-10-CM | POA: Diagnosis not present

## 2017-08-11 DIAGNOSIS — N3001 Acute cystitis with hematuria: Secondary | ICD-10-CM | POA: Insufficient documentation

## 2017-08-11 DIAGNOSIS — R339 Retention of urine, unspecified: Secondary | ICD-10-CM | POA: Diagnosis not present

## 2017-08-12 LAB — URINE CULTURE

## 2017-08-13 DIAGNOSIS — Z961 Presence of intraocular lens: Secondary | ICD-10-CM | POA: Diagnosis not present

## 2017-08-24 DIAGNOSIS — H18421 Band keratopathy, right eye: Secondary | ICD-10-CM | POA: Diagnosis not present

## 2017-08-27 DIAGNOSIS — I5022 Chronic systolic (congestive) heart failure: Secondary | ICD-10-CM | POA: Diagnosis not present

## 2017-08-27 DIAGNOSIS — I2510 Atherosclerotic heart disease of native coronary artery without angina pectoris: Secondary | ICD-10-CM | POA: Diagnosis not present

## 2017-08-27 DIAGNOSIS — I6523 Occlusion and stenosis of bilateral carotid arteries: Secondary | ICD-10-CM | POA: Diagnosis not present

## 2017-08-27 DIAGNOSIS — I10 Essential (primary) hypertension: Secondary | ICD-10-CM | POA: Diagnosis not present

## 2017-08-28 DIAGNOSIS — R339 Retention of urine, unspecified: Secondary | ICD-10-CM | POA: Diagnosis not present

## 2017-08-28 DIAGNOSIS — N3001 Acute cystitis with hematuria: Secondary | ICD-10-CM | POA: Diagnosis not present

## 2017-08-28 DIAGNOSIS — N453 Epididymo-orchitis: Secondary | ICD-10-CM | POA: Diagnosis not present

## 2017-08-28 DIAGNOSIS — Z5181 Encounter for therapeutic drug level monitoring: Secondary | ICD-10-CM | POA: Diagnosis not present

## 2017-08-28 DIAGNOSIS — G7000 Myasthenia gravis without (acute) exacerbation: Secondary | ICD-10-CM | POA: Diagnosis not present

## 2017-08-28 DIAGNOSIS — N433 Hydrocele, unspecified: Secondary | ICD-10-CM | POA: Diagnosis not present

## 2017-08-28 DIAGNOSIS — Z79899 Other long term (current) drug therapy: Secondary | ICD-10-CM | POA: Diagnosis not present

## 2017-08-28 DIAGNOSIS — G7080 Lambert-Eaton syndrome, unspecified: Secondary | ICD-10-CM | POA: Diagnosis not present

## 2017-09-02 DIAGNOSIS — N453 Epididymo-orchitis: Secondary | ICD-10-CM | POA: Insufficient documentation

## 2017-09-10 ENCOUNTER — Encounter: Payer: Medicare Other | Admitting: Internal Medicine

## 2017-09-14 DIAGNOSIS — H18421 Band keratopathy, right eye: Secondary | ICD-10-CM | POA: Diagnosis not present

## 2017-09-23 DIAGNOSIS — H18421 Band keratopathy, right eye: Secondary | ICD-10-CM | POA: Diagnosis not present

## 2017-10-28 DIAGNOSIS — N401 Enlarged prostate with lower urinary tract symptoms: Secondary | ICD-10-CM | POA: Diagnosis not present

## 2017-10-28 DIAGNOSIS — Z8551 Personal history of malignant neoplasm of bladder: Secondary | ICD-10-CM | POA: Diagnosis not present

## 2017-10-28 DIAGNOSIS — N139 Obstructive and reflux uropathy, unspecified: Secondary | ICD-10-CM | POA: Diagnosis not present

## 2017-10-28 DIAGNOSIS — N400 Enlarged prostate without lower urinary tract symptoms: Secondary | ICD-10-CM | POA: Diagnosis not present

## 2017-10-28 DIAGNOSIS — N138 Other obstructive and reflux uropathy: Secondary | ICD-10-CM | POA: Diagnosis not present

## 2017-11-02 DIAGNOSIS — H18421 Band keratopathy, right eye: Secondary | ICD-10-CM | POA: Diagnosis not present

## 2017-11-19 ENCOUNTER — Encounter: Payer: Self-pay | Admitting: Internal Medicine

## 2017-11-19 ENCOUNTER — Ambulatory Visit (INDEPENDENT_AMBULATORY_CARE_PROVIDER_SITE_OTHER): Payer: Medicare Other | Admitting: Internal Medicine

## 2017-11-19 VITALS — BP 132/72 | HR 65 | Temp 97.5°F | Resp 18 | Wt 199.0 lb

## 2017-11-19 DIAGNOSIS — G7000 Myasthenia gravis without (acute) exacerbation: Secondary | ICD-10-CM

## 2017-11-19 DIAGNOSIS — I509 Heart failure, unspecified: Secondary | ICD-10-CM | POA: Diagnosis not present

## 2017-11-19 DIAGNOSIS — I712 Thoracic aortic aneurysm, without rupture, unspecified: Secondary | ICD-10-CM

## 2017-11-19 DIAGNOSIS — E7800 Pure hypercholesterolemia, unspecified: Secondary | ICD-10-CM

## 2017-11-19 DIAGNOSIS — Z0000 Encounter for general adult medical examination without abnormal findings: Secondary | ICD-10-CM | POA: Diagnosis not present

## 2017-11-19 DIAGNOSIS — S81811A Laceration without foreign body, right lower leg, initial encounter: Secondary | ICD-10-CM

## 2017-11-19 DIAGNOSIS — I10 Essential (primary) hypertension: Secondary | ICD-10-CM

## 2017-11-19 DIAGNOSIS — D649 Anemia, unspecified: Secondary | ICD-10-CM

## 2017-11-19 DIAGNOSIS — Z8551 Personal history of malignant neoplasm of bladder: Secondary | ICD-10-CM

## 2017-11-19 DIAGNOSIS — G7080 Lambert-Eaton syndrome, unspecified: Secondary | ICD-10-CM | POA: Diagnosis not present

## 2017-11-19 DIAGNOSIS — I2510 Atherosclerotic heart disease of native coronary artery without angina pectoris: Secondary | ICD-10-CM

## 2017-11-19 NOTE — Assessment & Plan Note (Signed)
Physical today 11/19/17.  Followed by Dr Jacqlyn Larsen for prostate checks.  colonosocpy 10/2011

## 2017-11-19 NOTE — Progress Notes (Signed)
Patient ID: [name], male   DOB: 1925-12-06, 84 y.o.   MRN: [id number]   Subjective:    Patient ID: [name], male    DOB: March 18, 1926, 84 y.o.   MRN: [id number]  HPI  Patient here for his physical exam.  He was recently evaluated by urology 10/28/17.  Cystoscopy.  No recurrence of his bladder cancer.  Recommended f/u in one year.  Problems with left hip bursitis.  S/p injection.  Doing better.  If followed by Dr Nadara Mustard for his myasthenia and [name].  Last evaluated 07/2017.  Stable.  States overall he feels he is doing relatively well.  No chest pain.  Breathing stable.  No acid reflux.  No abdominal pain.  Bowels moving.  No urine change.  A piece of wood hit his leg.  Residual laceration.  No increased warmth or redness.     Past Medical History:  Diagnosis Date  . Abnormal chest CT 03/28/2017  . Allergy   . Anemia   . Angina pectoris ([place]) 04/02/2011  . Aortic aneurysm ([place]) 03/28/2017   Saw Dr Genevive Bi 04/16/17 - recommended f/u CT in 3 months.    . Atherosclerosis of both carotid arteries 07/17/2014  . Benign lipomatous neoplasm of skin and subcutaneous tissue of left arm 10/04/2013  . Bilateral carotid artery stenosis 08/30/2014   Overview:  Less than 50% 2015  . Bladder cancer ([place]) 03/05/2013  . CAD (coronary artery disease) 06/18/2014  . Cancer ([place]) 11-24-12   bladder. BCG treatments by Dr Jacqlyn Larsen.  . Chicken pox   . Chronic prostatitis 09/30/2012  . Colon polyp   . Congestive heart failure ([place]) 04/20/2014   Overview:  Overview:  With anterior mi and moderate lv dyfunction ef 35%  . Dizziness 12/22/2016  . 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 Eaton-Lambert syndrome ([place])   . Elevated prostate specific antigen (PSA) 09/30/2012  . Essential hypertension, benign   . Gross hematuria 09/30/2012  . Herpes zoster 11/28/2011   Overview:  with ocular involvement OD  . Hypercholesterolemia   . Hypertension, benign 04/02/2011  . Hypotension   . Moderate mitral  insufficiency 05/18/2015  . Moderate tricuspid insufficiency 05/18/2015  . Myasthenia gravis ([place])   . Myasthenia gravis without exacerbation ([place]) 03/31/2011  . Shingles 2013   Past Surgical History:  Procedure Laterality Date  . APPENDECTOMY  1958  . CHOLECYSTECTOMY  1979  . HERNIA REPAIR  1970  . TONSILLECTOMY  1958   Family History  Problem Relation Age of Onset  . Lung cancer Sister   . Prostate cancer Brother   . Lung cancer Brother   . Arthritis Unknown        parent  . Colon cancer Neg Hx    Social History   Socioeconomic History  . Marital status: Married    Spouse name: None  . Number of children: None  . Years of education: None  . Highest education level: None  Social Needs  . Financial resource strain: None  . Food insecurity - worry: None  . Food insecurity - inability: None  . Transportation needs - medical: None  . Transportation needs - non-medical: None  Occupational History  . None  Tobacco Use  . Smoking status: Former Smoker    Last attempt to quit: 09/29/1958    Years since quitting: 59.1  . Smokeless tobacco: Never Used  Substance and Sexual Activity  . Alcohol use: Yes    Alcohol/week: 0.0 oz  Comment: occasionally  . Drug use: No  . Sexual activity: No  Other Topics Concern  . None  Social History Narrative   Pt is married with 2 children - 1 son, 1 daughter. Previously self-employed in [employer]    Outpatient Encounter Medications as of 11/19/2017  Medication Sig  . acetaminophen (TYLENOL) 500 MG tablet Take 500 mg by mouth every 6 (six) hours as needed.  Marland Kitchen aspirin 81 MG tablet Take 81 mg by mouth daily.  . finasteride (PROSCAR) 5 MG tablet Take 5 mg by mouth daily.  . furosemide (LASIX) 20 MG tablet Take 20 mg by mouth daily.  . hydroxypropyl methylcellulose / hypromellose (ISOPTO TEARS / GONIOVISC) 2.5 % ophthalmic solution Place 1 drop into both eyes daily.  . isosorbide mononitrate (IMDUR) 30 MG 24 hr tablet Take 30 mg by  mouth daily.  Marland Kitchen lisinopril (PRINIVIL,ZESTRIL) 5 MG tablet Take 5 mg by mouth daily.  . mycophenolate (CELLCEPT) 250 MG capsule Take 1,000 mg by mouth 2 (two) times daily.   Marland Kitchen oxyCODONE (ROXICODONE) 5 MG immediate release tablet Take 1 tablet (5 mg total) every 6 (six) hours as needed by mouth for severe pain.  . pravastatin (PRAVACHOL) 20 MG tablet Take 1 tablet (20 mg total) by mouth daily.  . prednisoLONE acetate (PRED FORTE) 1 % ophthalmic suspension Place 1 drop into the right eye 4 (four) times daily.  Marland Kitchen pyridostigmine (MESTINON) 60 MG tablet Take 30 mg by mouth 6 (six) times daily.   . tamsulosin (FLOMAX) 0.4 MG CAPS Take 0.4 mg by mouth daily.   No facility-administered encounter medications on file as of 11/19/2017.     Review of Systems  Constitutional: Negative for appetite change and unexpected weight change.  HENT: Negative for congestion and sinus pressure.   Eyes: Negative for pain and visual disturbance.  Respiratory: Negative for cough, chest tightness and shortness of breath.   Cardiovascular: Negative for chest pain, palpitations and leg swelling.  Gastrointestinal: Negative for abdominal pain, diarrhea, nausea and vomiting.  Genitourinary: Negative for difficulty urinating and dysuria.  Musculoskeletal: Negative for joint swelling and myalgias.  Skin: Negative for color change and rash.       Skin laceration - leg.    Neurological: Negative for dizziness, light-headedness and headaches.  Hematological: Negative for adenopathy. Does not bruise/bleed easily.  Psychiatric/Behavioral: Negative for agitation and dysphoric mood.       Objective:     Blood pressure rechecked by me:  132/72  Physical Exam  Constitutional: He is oriented to person, place, and time. He appears well-developed and well-nourished. No distress.  HENT:  Head: Normocephalic and atraumatic.  Nose: Nose normal.  Mouth/Throat: Oropharynx is clear and moist. No oropharyngeal exudate.  Eyes:  Conjunctivae are normal. Right eye exhibits no discharge. Left eye exhibits no discharge.  Neck: Neck supple. No thyromegaly present.  Cardiovascular: Normal rate and regular rhythm.  Pulmonary/Chest: Breath sounds normal. No respiratory distress. He has no wheezes.  Abdominal: Soft. Bowel sounds are normal. There is no tenderness.  Genitourinary:  Genitourinary Comments: Not performed.    Musculoskeletal: He exhibits no edema or tenderness.  Lymphadenopathy:    He has no cervical adenopathy.  Neurological: He is alert and oriented to person, place, and time.  Skin: Skin is warm and dry. No rash noted.  Right leg laceration.  No increased erythema.  No increased warmth.  No sign of infection.   Psychiatric: He has a normal mood and affect. His behavior is  normal.    BP 132/72   Pulse 65   Temp (!) 97.5 F (36.4 C) (Oral)   Resp 18   Wt 199 lb (90.3 kg)   SpO2 97%   BMI 28.55 kg/m  Wt Readings from Last 3 Encounters:  11/19/17 199 lb (90.3 kg)  08/10/17 198 lb (89.8 kg)  07/08/17 199 lb 3.2 oz (90.4 kg)     Lab Results  Component Value Date   WBC 11.0 (H) 08/10/2017   HGB 13.0 08/10/2017   HCT 39.8 (L) 08/10/2017   PLT 181 08/10/2017   GLUCOSE 117 (H) 08/10/2017   CHOL 166 07/06/2017   TRIG 113.0 07/06/2017   HDL 53.50 07/06/2017   LDLDIRECT 143.5 08/26/2013   LDLCALC 90 07/06/2017   ALT 12 07/06/2017   AST 10 07/06/2017   NA 139 08/10/2017   K 4.5 08/10/2017   CL 101 08/10/2017   CREATININE 0.91 08/10/2017   BUN 20 08/10/2017   CO2 26 08/10/2017   TSH 2.26 09/09/2016   HGBA1C 6.2 07/06/2017    US Scrotum  Result Date: 08/10/2017 CLINICAL DATA:  Right testicular pain for the past day. History of bladder malignancy EXAM: SCROTAL ULTRASOUND DOPPLER ULTRASOUND OF THE TESTICLES TECHNIQUE: Complete ultrasound examination of the testicles, epididymis, and other scrotal structures was performed. Color and spectral Doppler ultrasound were also utilized to evaluate  blood flow to the testicles. COMPARISON:  None in PACs FINDINGS: Right testicle Measurements: 3.7 x 2.7 x 2.5 cm. No mass or microlithiasis visualized. Left testicle Measurements: 3.3 x 1.9 x 3.0 cm. No mass or microlithiasis visualized. Right epididymis: There is an epididymal cyst demonstrating internal echoes measuring 6 x 5 x 49mm. There are internal echoes within it. It is not hypervascular along its margins. Left epididymis: There is a simple appearing epididymal cyst measuring 8 x 5 x 7 mm. Hydrocele: There bilateral hydroceles. On the right the hydrocele is complex in appearance and is larger than that on the left. Varicocele:  There is a probable varicocele on the right. Pulsed Doppler interrogation of both testes demonstrates normal low resistance arterial and venous waveforms bilaterally. IMPRESSION: There is no evidence of orchitis,testicular torsion, or acute epididymitis. There is a complex appearing epididymal cyst on the right. There are bilateral hydroceles greatest on the right. Internal echoes are present within the right-sided hydrocele. Probable varicocele on the right. Electronically Signed   By: David  Martinique M.D.   On: 08/10/2017 15:38   [place] Scrotom Doppler  Result Date: 08/10/2017 CLINICAL DATA:  Right testicular pain for the past day. History of bladder malignancy EXAM: SCROTAL ULTRASOUND DOPPLER ULTRASOUND OF THE TESTICLES TECHNIQUE: Complete ultrasound examination of the testicles, epididymis, and other scrotal structures was performed. Color and spectral Doppler ultrasound were also utilized to evaluate blood flow to the testicles. COMPARISON:  None in PACs FINDINGS: Right testicle Measurements: 3.7 x 2.7 x 2.5 cm. No mass or microlithiasis visualized. Left testicle Measurements: 3.3 x 1.9 x 3.0 cm. No mass or microlithiasis visualized. Right epididymis: There is an epididymal cyst demonstrating internal echoes measuring 6 x 5 x 59mm. There are internal echoes within it. It is not  hypervascular along its margins. Left epididymis: There is a simple appearing epididymal cyst measuring 8 x 5 x 7 mm. Hydrocele: There bilateral hydroceles. On the right the hydrocele is complex in appearance and is larger than that on the left. Varicocele:  There is a probable varicocele on the right. Pulsed Doppler interrogation of both testes demonstrates normal  low resistance arterial and venous waveforms bilaterally. IMPRESSION: There is no evidence of orchitis,testicular torsion, or acute epididymitis. There is a complex appearing epididymal cyst on the right. There are bilateral hydroceles greatest on the right. Internal echoes are present within the right-sided hydrocele. Probable varicocele on the right. Electronically Signed   By: David  Martinique M.D.   On: 08/10/2017 15:38       Assessment & Plan:   Problem List Items Addressed This Visit    Anemia    Follow cbc.       Relevant Orders   CBC with Differential/Platelet   Aortic aneurysm (HCC)    Saw Dr Genevive Bi 04/16/17.  Recommended f/u CT scan in 3 months.  Had f/u in 07/2017.  Stable.  Per note, recommended f/u in one year.        CAD (coronary artery disease)    Followed by cardiology.  Continue risk factor modification.        CHF (congestive heart failure) ([place])    Followed by cardiology. No evidence of volume overload on exam.  Same medication regimen.  Follow.       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  Last evaluated 07/2017. Stable.       Health care maintenance    Physical today 11/19/17.  Followed by Dr Jacqlyn Larsen for prostate checks.  colonosocpy 10/2011      History of bladder cancer    Followed by Dr Jacqlyn Larsen.  Just had cystoscopy.  No recurrence.        Hypercholesterolemia    On pravastatin.  Low cholesterol diet and exercise.  Follow lipid panel and liver function tests.        Relevant Orders   Lipid panel   Hepatic function panel   Hypertension, benign    Blood pressure under good  control.  Continue same medication regimen.  Follow pressures.  Follow metabolic panel.        Relevant Orders   TSH   Basic metabolic panel   Myasthenia gravis without exacerbation ([place])    Followed by Dr Nadara Mustard.  Stable.  Last evaluated 07/2017.        Other Visit Diagnoses    Routine general medical examination at a health care facility    -  Primary   Leg laceration, right, initial encounter       no evidence of infection.  healing. keep clean.  follow.        [name], MD

## 2017-11-22 ENCOUNTER — Encounter: Payer: Self-pay | Admitting: Internal Medicine

## 2017-11-22 NOTE — Assessment & Plan Note (Signed)
Blood pressure under good control.  Continue same medication regimen.  Follow pressures.  Follow metabolic panel.   

## 2017-11-22 NOTE — Assessment & Plan Note (Signed)
Followed by cardiology. No evidence of volume overload on exam.  Same medication regimen.  Follow.

## 2017-11-22 NOTE — Assessment & Plan Note (Signed)
Followed by cardiology.  Continue risk factor modification.   

## 2017-11-22 NOTE — Assessment & Plan Note (Signed)
Followed by Dr Nadara Mustard.  Stable.  Last evaluated 07/2017.

## 2017-11-22 NOTE — Assessment & Plan Note (Signed)
Followed by Dr Nadara Mustard.  Last evaluated 07/2017. Stable.

## 2017-11-22 NOTE — Assessment & Plan Note (Signed)
Saw Dr Genevive Bi 04/16/17.  Recommended f/u CT scan in 3 months.  Had f/u in 07/2017.  Stable.  Per note, recommended f/u in one year.

## 2017-11-22 NOTE — Assessment & Plan Note (Signed)
On pravastatin.  Low cholesterol diet and exercise.  Follow lipid panel and liver function tests.   

## 2017-11-22 NOTE — Assessment & Plan Note (Signed)
Follow cbc.  

## 2017-11-22 NOTE — Assessment & Plan Note (Signed)
Followed by Dr Jacqlyn Larsen.  Just had cystoscopy.  No recurrence.

## 2017-12-03 DIAGNOSIS — I350 Nonrheumatic aortic (valve) stenosis: Secondary | ICD-10-CM | POA: Insufficient documentation

## 2017-12-03 DIAGNOSIS — E782 Mixed hyperlipidemia: Secondary | ICD-10-CM | POA: Diagnosis not present

## 2017-12-03 DIAGNOSIS — I6523 Occlusion and stenosis of bilateral carotid arteries: Secondary | ICD-10-CM | POA: Diagnosis not present

## 2017-12-03 DIAGNOSIS — I2510 Atherosclerotic heart disease of native coronary artery without angina pectoris: Secondary | ICD-10-CM | POA: Diagnosis not present

## 2017-12-03 DIAGNOSIS — I10 Essential (primary) hypertension: Secondary | ICD-10-CM | POA: Diagnosis not present

## 2017-12-14 DIAGNOSIS — H209 Unspecified iridocyclitis: Secondary | ICD-10-CM | POA: Diagnosis not present

## 2017-12-15 DIAGNOSIS — H43391 Other vitreous opacities, right eye: Secondary | ICD-10-CM | POA: Diagnosis not present

## 2017-12-15 DIAGNOSIS — H18421 Band keratopathy, right eye: Secondary | ICD-10-CM | POA: Diagnosis not present

## 2017-12-18 DIAGNOSIS — H209 Unspecified iridocyclitis: Secondary | ICD-10-CM | POA: Diagnosis not present

## 2017-12-25 ENCOUNTER — Other Ambulatory Visit
Admission: RE | Admit: 2017-12-25 | Discharge: 2017-12-25 | Disposition: A | Discharge status: Home / Self Care | Payer: Medicare Other | Source: Ambulatory Visit | Attending: Ophthalmology | Admitting: Ophthalmology

## 2017-12-25 DIAGNOSIS — H16001 Unspecified corneal ulcer, right eye: Secondary | ICD-10-CM | POA: Insufficient documentation

## 2017-12-26 DIAGNOSIS — H16001 Unspecified corneal ulcer, right eye: Secondary | ICD-10-CM | POA: Diagnosis not present

## 2017-12-29 DIAGNOSIS — H16001 Unspecified corneal ulcer, right eye: Secondary | ICD-10-CM | POA: Diagnosis not present

## 2017-12-30 LAB — EYE CULTURE

## 2017-12-31 DIAGNOSIS — H16001 Unspecified corneal ulcer, right eye: Secondary | ICD-10-CM | POA: Diagnosis not present

## 2018-01-06 DIAGNOSIS — H16001 Unspecified corneal ulcer, right eye: Secondary | ICD-10-CM | POA: Diagnosis not present

## 2018-01-14 DIAGNOSIS — H16001 Unspecified corneal ulcer, right eye: Secondary | ICD-10-CM | POA: Diagnosis not present

## 2018-03-22 DIAGNOSIS — G7080 Lambert-Eaton syndrome, unspecified: Secondary | ICD-10-CM | POA: Diagnosis not present

## 2018-03-22 DIAGNOSIS — G7000 Myasthenia gravis without (acute) exacerbation: Secondary | ICD-10-CM | POA: Diagnosis not present

## 2018-03-22 DIAGNOSIS — Z5181 Encounter for therapeutic drug level monitoring: Secondary | ICD-10-CM | POA: Diagnosis not present

## 2018-03-22 DIAGNOSIS — Z79899 Other long term (current) drug therapy: Secondary | ICD-10-CM | POA: Diagnosis not present

## 2018-03-29 ENCOUNTER — Other Ambulatory Visit: Payer: Self-pay | Admitting: Internal Medicine

## 2018-03-29 ENCOUNTER — Telehealth: Payer: Self-pay | Admitting: Internal Medicine

## 2018-03-29 DIAGNOSIS — Z5181 Encounter for therapeutic drug level monitoring: Secondary | ICD-10-CM

## 2018-03-29 NOTE — Telephone Encounter (Signed)
Gave paper for labs to you.

## 2018-03-29 NOTE — Telephone Encounter (Signed)
Pt dropped off lab order from [facility].. Pt would like labs done here.. Placed order in Dr.Scotts color folder upfront

## 2018-03-29 NOTE — Telephone Encounter (Signed)
Orders placed for labs.  He can come in at his convenience for fasting labs.  Make sure lab knows that I want the ones previously ordered and the ones I added today.

## 2018-03-30 NOTE — Telephone Encounter (Signed)
Patient aware and scheduled for labs tomorrow morning

## 2018-03-31 ENCOUNTER — Other Ambulatory Visit (INDEPENDENT_AMBULATORY_CARE_PROVIDER_SITE_OTHER): Payer: Medicare Other

## 2018-03-31 DIAGNOSIS — D649 Anemia, unspecified: Secondary | ICD-10-CM

## 2018-03-31 DIAGNOSIS — Z5181 Encounter for therapeutic drug level monitoring: Secondary | ICD-10-CM | POA: Diagnosis not present

## 2018-03-31 DIAGNOSIS — E7800 Pure hypercholesterolemia, unspecified: Secondary | ICD-10-CM

## 2018-03-31 DIAGNOSIS — I10 Essential (primary) hypertension: Secondary | ICD-10-CM | POA: Diagnosis not present

## 2018-03-31 LAB — CBC WITH DIFFERENTIAL/PLATELET
BASOS PCT: 0.8 % (ref 0.0–3.0)
Basophils Absolute: 0 10*3/uL (ref 0.0–0.1)
EOS PCT: 1.9 % (ref 0.0–5.0)
Eosinophils Absolute: 0.1 10*3/uL (ref 0.0–0.7)
HEMATOCRIT: 39.8 % (ref 39.0–52.0)
Hemoglobin: 13 g/dL (ref 13.0–17.0)
LYMPHS PCT: 26.5 % (ref 12.0–46.0)
Lymphs Abs: 1 10*3/uL (ref 0.7–4.0)
MCHC: 32.7 g/dL (ref 30.0–36.0)
MCV: 91.9 fl (ref 78.0–100.0)
Monocytes Absolute: 0.4 10*3/uL (ref 0.1–1.0)
Monocytes Relative: 9.2 % (ref 3.0–12.0)
Neutro Abs: 2.4 10*3/uL (ref 1.4–7.7)
Neutrophils Relative %: 61.6 % (ref 43.0–77.0)
Platelets: 151 10*3/uL (ref 150.0–400.0)
RBC: 4.33 Mil/uL (ref 4.22–5.81)
RDW: 14.4 % (ref 11.5–15.5)
WBC: 4 10*3/uL (ref 4.0–10.5)

## 2018-03-31 LAB — HEPATIC FUNCTION PANEL
ALK PHOS: 50 U/L (ref 39–117)
ALT: 8 U/L (ref 0–53)
AST: 11 U/L (ref 0–37)
Albumin: 4.1 g/dL (ref 3.5–5.2)
BILIRUBIN DIRECT: 0.1 mg/dL (ref 0.0–0.3)
BILIRUBIN TOTAL: 0.7 mg/dL (ref 0.2–1.2)
Total Protein: 6.3 g/dL (ref 6.0–8.3)

## 2018-03-31 LAB — T4, FREE: FREE T4: 0.79 ng/dL (ref 0.60–1.60)

## 2018-03-31 LAB — LIPID PANEL
CHOL/HDL RATIO: 3
Cholesterol: 149 mg/dL (ref 0–200)
HDL: 51 mg/dL (ref >39.00)
LDL CALC: 81 mg/dL (ref 0–99)
NONHDL: 98.11
TRIGLYCERIDES: 88 mg/dL (ref 0.0–149.0)
VLDL: 17.6 mg/dL (ref 0.0–40.0)

## 2018-03-31 LAB — BASIC METABOLIC PANEL
BUN: 23 mg/dL (ref 6–23)
CHLORIDE: 105 meq/L (ref 96–112)
CO2: 30 mEq/L (ref 19–32)
Calcium: 9.3 mg/dL (ref 8.4–10.5)
Creatinine, Ser: 0.99 mg/dL (ref 0.40–1.50)
GFR: 75.55 mL/min (ref >60.00)
Glucose, Bld: 108 mg/dL — ABNORMAL HIGH (ref 70–99)
POTASSIUM: 4.6 meq/L (ref 3.5–5.1)
SODIUM: 142 meq/L (ref 135–145)

## 2018-03-31 LAB — VITAMIN B12: Vitamin B-12: 417 pg/mL (ref 211–911)

## 2018-03-31 LAB — TSH: TSH: 2.55 u[IU]/mL (ref 0.35–4.50)

## 2018-04-07 DIAGNOSIS — I5022 Chronic systolic (congestive) heart failure: Secondary | ICD-10-CM | POA: Diagnosis not present

## 2018-04-07 DIAGNOSIS — I2510 Atherosclerotic heart disease of native coronary artery without angina pectoris: Secondary | ICD-10-CM | POA: Diagnosis not present

## 2018-04-07 DIAGNOSIS — I6523 Occlusion and stenosis of bilateral carotid arteries: Secondary | ICD-10-CM | POA: Diagnosis not present

## 2018-04-07 DIAGNOSIS — I10 Essential (primary) hypertension: Secondary | ICD-10-CM | POA: Diagnosis not present

## 2018-05-05 DIAGNOSIS — H18421 Band keratopathy, right eye: Secondary | ICD-10-CM | POA: Diagnosis not present

## 2018-05-26 ENCOUNTER — Ambulatory Visit: Payer: Medicare Other | Admitting: Internal Medicine

## 2018-05-26 ENCOUNTER — Encounter: Payer: Self-pay | Admitting: Internal Medicine

## 2018-05-26 DIAGNOSIS — I10 Essential (primary) hypertension: Secondary | ICD-10-CM

## 2018-05-26 DIAGNOSIS — I712 Thoracic aortic aneurysm, without rupture, unspecified: Secondary | ICD-10-CM

## 2018-05-26 DIAGNOSIS — Z8619 Personal history of other infectious and parasitic diseases: Secondary | ICD-10-CM

## 2018-05-26 DIAGNOSIS — D649 Anemia, unspecified: Secondary | ICD-10-CM | POA: Diagnosis not present

## 2018-05-26 DIAGNOSIS — H5711 Ocular pain, right eye: Secondary | ICD-10-CM

## 2018-05-26 DIAGNOSIS — R9389 Abnormal findings on diagnostic imaging of other specified body structures: Secondary | ICD-10-CM | POA: Diagnosis not present

## 2018-05-26 DIAGNOSIS — G7080 Lambert-Eaton syndrome, unspecified: Secondary | ICD-10-CM

## 2018-05-26 DIAGNOSIS — E7800 Pure hypercholesterolemia, unspecified: Secondary | ICD-10-CM

## 2018-05-26 DIAGNOSIS — I2510 Atherosclerotic heart disease of native coronary artery without angina pectoris: Secondary | ICD-10-CM

## 2018-05-26 DIAGNOSIS — H16001 Unspecified corneal ulcer, right eye: Secondary | ICD-10-CM | POA: Diagnosis not present

## 2018-05-26 DIAGNOSIS — Z8551 Personal history of malignant neoplasm of bladder: Secondary | ICD-10-CM

## 2018-05-26 NOTE — Progress Notes (Signed)
Patient ID: [name], male   DOB: Mar 31, 1926, 84 y.o.   MRN: [id number]   Subjective:    Patient ID: [name], male    DOB: 31-May-1926, 84 y.o.   MRN: [id number]  HPI  Patient here for a scheduled follow up.  Has a history of shingles (occular).  Saw ophthalmology.  States that 6 months ago, he developed aching - eye.  Saw Dr Thomasene Ripple.  Was given abx and steroid eye drops.  Pain improved.  Unable to see out of right eye.  States was stable until two days ago and developed pain again.  Persistent pain.  Using steroid drops, but has decreased to one drop in the eye.  No headache.  No chest pain.  Breathing stable.  No acid reflux.  No abdominal pain.  Bowels moving.     Past Medical History:  Diagnosis Date  . Abnormal chest CT 03/28/2017  . Allergy   . Anemia   . Angina pectoris ([place]) 04/02/2011  . Aortic aneurysm ([place]) 03/28/2017   Saw Dr Genevive Bi 04/16/17 - recommended f/u CT in 3 months.    . Atherosclerosis of both carotid arteries 07/17/2014  . Benign lipomatous neoplasm of skin and subcutaneous tissue of left arm 10/04/2013  . Bilateral carotid artery stenosis 08/30/2014   Overview:  Less than 50% 2015  . Bladder cancer ([place]) 03/05/2013  . CAD (coronary artery disease) 06/18/2014  . Cancer ([place]) 11-24-12   bladder. BCG treatments by Dr Jacqlyn Larsen.  . Chicken pox   . Chronic prostatitis 09/30/2012  . Colon polyp   . Congestive heart failure ([place]) 04/20/2014   Overview:  Overview:  With anterior mi and moderate lv dyfunction ef 35%  . Dizziness 12/22/2016  . 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 Eaton-Lambert syndrome ([place])   . Elevated prostate specific antigen (PSA) 09/30/2012  . Essential hypertension, benign   . Gross hematuria 09/30/2012  . Herpes zoster 11/28/2011   Overview:  with ocular involvement OD  . Hypercholesterolemia   . Hypertension, benign 04/02/2011  . Hypotension   . Moderate mitral insufficiency 05/18/2015  . Moderate tricuspid insufficiency 05/18/2015  .  Myasthenia gravis ([place])   . Myasthenia gravis without exacerbation ([place]) 03/31/2011  . Shingles 2013   Past Surgical History:  Procedure Laterality Date  . APPENDECTOMY  1958  . CHOLECYSTECTOMY  1979  . HERNIA REPAIR  1970  . TONSILLECTOMY  1958   Family History  Problem Relation Age of Onset  . Lung cancer Sister   . Prostate cancer Brother   . Lung cancer Brother   . Arthritis Unknown        parent  . Colon cancer Neg Hx    Social History   Socioeconomic History  . Marital status: Married    Spouse name: Not on file  . Number of children: Not on file  . Years of education: Not on file  . Highest education level: Not on file  Occupational History  . Not on file  Social Needs  . Financial resource strain: Not on file  . Food insecurity:    Worry: Not on file    Inability: Not on file  . Transportation needs:    Medical: Not on file    Non-medical: Not on file  Tobacco Use  . Smoking status: Former Smoker    Last attempt to quit: 09/29/1958    Years since quitting: 59.7  . Smokeless tobacco: Never Used  Substance and Sexual Activity  .  Alcohol use: Yes    Alcohol/week: 0.0 standard drinks    Comment: occasionally  . Drug use: No  . Sexual activity: Never  Lifestyle  . Physical activity:    Days per week: Not on file    Minutes per session: Not on file  . Stress: Not on file  Relationships  . Social connections:    Talks on phone: Not on file    Gets together: Not on file    Attends religious service: Not on file    Active member of club or organization: Not on file    Attends meetings of clubs or organizations: Not on file    Relationship status: Not on file  Other Topics Concern  . Not on file  Social History Narrative   Pt is married with 2 children - 1 son, 1 daughter. Previously self-employed in [employer]    Outpatient Encounter Medications as of 05/26/2018  Medication Sig  . acetaminophen (TYLENOL) 500 MG tablet Take 500 mg by mouth every 6  (six) hours as needed.  Marland Kitchen aspirin 81 MG tablet Take 81 mg by mouth daily.  . finasteride (PROSCAR) 5 MG tablet Take 5 mg by mouth daily.  . furosemide (LASIX) 20 MG tablet Take 20 mg by mouth daily.  . hydroxypropyl methylcellulose / hypromellose (ISOPTO TEARS / GONIOVISC) 2.5 % ophthalmic solution Place 1 drop into both eyes daily.  . isosorbide mononitrate (IMDUR) 30 MG 24 hr tablet Take 30 mg by mouth daily.  Marland Kitchen lisinopril (PRINIVIL,ZESTRIL) 5 MG tablet Take 5 mg by mouth daily.  . mycophenolate (CELLCEPT) 250 MG capsule Take 1,000 mg by mouth 2 (two) times daily.   . pravastatin (PRAVACHOL) 20 MG tablet Take 1 tablet (20 mg total) by mouth daily.  . prednisoLONE acetate (PRED FORTE) 1 % ophthalmic suspension Place 1 drop into the right eye 4 (four) times daily.  Marland Kitchen pyridostigmine (MESTINON) 60 MG tablet Take 30 mg by mouth 6 (six) times daily.   . tamsulosin (FLOMAX) 0.4 MG CAPS Take 0.4 mg by mouth daily.  . [DISCONTINUED] oxyCODONE (ROXICODONE) 5 MG immediate release tablet Take 1 tablet (5 mg total) every 6 (six) hours as needed by mouth for severe pain.   No facility-administered encounter medications on file as of 05/26/2018.     Review of Systems  Constitutional: Negative for appetite change and unexpected weight change.  HENT: Negative for congestion and sinus pressure.   Eyes: Negative for visual disturbance.       Eye pain.   Respiratory: Negative for cough and chest tightness.        Breathing stable.    Cardiovascular: Negative for chest pain and palpitations.  Gastrointestinal: Negative for abdominal pain, diarrhea, nausea and vomiting.  Genitourinary: Negative for difficulty urinating and dysuria.  Musculoskeletal: Negative for joint swelling and myalgias.  Skin: Negative for color change and rash.  Neurological: Negative for dizziness, light-headedness and headaches.  Psychiatric/Behavioral: Negative for agitation and dysphoric mood.       Objective:    Physical  Exam  Constitutional: He appears well-developed and well-nourished. No distress.  HENT:  Nose: Nose normal.  Mouth/Throat: Oropharynx is clear and moist.  Eyes:  Erythema - right eye.  Unchanged.    Neck: Neck supple. No thyromegaly present.  Cardiovascular: Normal rate and regular rhythm.  Pulmonary/Chest: Effort normal and breath sounds normal. No respiratory distress.  Abdominal: Soft. Bowel sounds are normal. There is no tenderness.  Musculoskeletal: He exhibits no edema or tenderness.  Lymphadenopathy:    He has no cervical adenopathy.  Skin: No rash noted. No erythema.  Psychiatric: He has a normal mood and affect. His behavior is normal.    BP 130/64 (BP Location: Left Arm, Patient Position: Sitting, Cuff Size: Normal)   Pulse (!) 59   Temp 97.9 F (36.6 C) (Oral)   Resp 18   Wt 195 lb 12.8 oz (88.8 kg)   SpO2 96%   BMI 28.09 kg/m  Wt Readings from Last 3 Encounters:  05/26/18 195 lb 12.8 oz (88.8 kg)  11/19/17 199 lb (90.3 kg)  08/10/17 198 lb (89.8 kg)     Lab Results  Component Value Date   WBC 4.0 03/31/2018   HGB 13.0 03/31/2018   HCT 39.8 03/31/2018   PLT 151.0 03/31/2018   GLUCOSE 108 (H) 03/31/2018   CHOL 149 03/31/2018   TRIG 88.0 03/31/2018   HDL 51.00 03/31/2018   LDLDIRECT 143.5 08/26/2013   LDLCALC 81 03/31/2018   ALT 8 03/31/2018   AST 11 03/31/2018   NA 142 03/31/2018   K 4.6 03/31/2018   CL 105 03/31/2018   CREATININE 0.99 03/31/2018   BUN 23 03/31/2018   CO2 30 03/31/2018   TSH 2.55 03/31/2018   HGBA1C 6.2 07/06/2017       Assessment & Plan:   Problem List Items Addressed This Visit    Abnormal chest CT    Saw Dr Genevive Bi.  Had CT chest - 07/2017.  Recommended f/u CT chest in one year.        Anemia    Follow cbc.       Aortic aneurysm (HCC)    Saw Dr Genevive Bi.  Last CT - 07/2017.  Stable.  Recommended f/u in one year.        CAD (coronary artery disease)    Followed by cardiology.  Continue risk factor modification.         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  Stable.  Continue current treatment regimen.        Relevant Orders   CBC with Differential/Platelet   Eye pain    Eye pain as outlined.  Previous flare 6 moths ago.  Treated with abx eye drops and steroid drops.  Now with pain over the last two days.  Has decreased dose of steroid drop. May need to increase.  Ophthalmology notified.  Pt to be seen today for further treatment and evaluation.        History of bladder cancer    Followed by urology.       History of shingles    Had eye involvement.  Saw ophthalmology.        Hypercholesterolemia    On pravastatin.  Low cholesterol diet and exercise.  Follow lipid panel and liver function tests.        Relevant Orders   Hepatic function panel   Lipid panel   Hypertension, benign    Blood pressure under good control.  Continue same medication regimen.  Follow pressures.  Follow metabolic panel.        Relevant Orders   Basic metabolic panel       [name], MD

## 2018-05-27 DIAGNOSIS — H16001 Unspecified corneal ulcer, right eye: Secondary | ICD-10-CM | POA: Diagnosis not present

## 2018-05-28 DIAGNOSIS — H16001 Unspecified corneal ulcer, right eye: Secondary | ICD-10-CM | POA: Diagnosis not present

## 2018-05-31 ENCOUNTER — Encounter: Payer: Self-pay | Admitting: Internal Medicine

## 2018-05-31 DIAGNOSIS — H5710 Ocular pain, unspecified eye: Secondary | ICD-10-CM | POA: Insufficient documentation

## 2018-05-31 NOTE — Assessment & Plan Note (Signed)
Saw Dr Genevive Bi.  Had CT chest - 07/2017.  Recommended f/u CT chest in one year.

## 2018-05-31 NOTE — Assessment & Plan Note (Signed)
Followed by cardiology.  Continue risk factor modification.   

## 2018-05-31 NOTE — Assessment & Plan Note (Signed)
Followed by Dr Nadara Mustard.  Stable.  Continue current treatment regimen.

## 2018-05-31 NOTE — Assessment & Plan Note (Signed)
Had eye involvement.  Saw ophthalmology.

## 2018-05-31 NOTE — Assessment & Plan Note (Signed)
Blood pressure under good control.  Continue same medication regimen.  Follow pressures.  Follow metabolic panel.   

## 2018-05-31 NOTE — Assessment & Plan Note (Signed)
Followed by urology.   

## 2018-05-31 NOTE — Assessment & Plan Note (Signed)
Saw Dr Genevive Bi.  Last CT - 07/2017.  Stable.  Recommended f/u in one year.

## 2018-05-31 NOTE — Assessment & Plan Note (Signed)
Eye pain as outlined.  Previous flare 6 moths ago.  Treated with abx eye drops and steroid drops.  Now with pain over the last two days.  Has decreased dose of steroid drop. May need to increase.  Ophthalmology notified.  Pt to be seen today for further treatment and evaluation.

## 2018-05-31 NOTE — Assessment & Plan Note (Signed)
On pravastatin.  Low cholesterol diet and exercise.  Follow lipid panel and liver function tests.   

## 2018-05-31 NOTE — Assessment & Plan Note (Signed)
Follow cbc.  

## 2018-06-01 DIAGNOSIS — H16001 Unspecified corneal ulcer, right eye: Secondary | ICD-10-CM | POA: Diagnosis not present

## 2018-06-02 DIAGNOSIS — H16001 Unspecified corneal ulcer, right eye: Secondary | ICD-10-CM | POA: Diagnosis not present

## 2018-06-03 DIAGNOSIS — H16001 Unspecified corneal ulcer, right eye: Secondary | ICD-10-CM | POA: Diagnosis not present

## 2018-06-07 DIAGNOSIS — H16001 Unspecified corneal ulcer, right eye: Secondary | ICD-10-CM | POA: Diagnosis not present

## 2018-06-14 DIAGNOSIS — H16001 Unspecified corneal ulcer, right eye: Secondary | ICD-10-CM | POA: Diagnosis not present

## 2018-06-28 DIAGNOSIS — H16001 Unspecified corneal ulcer, right eye: Secondary | ICD-10-CM | POA: Diagnosis not present

## 2018-07-02 ENCOUNTER — Other Ambulatory Visit: Payer: Self-pay

## 2018-07-02 ENCOUNTER — Telehealth: Payer: Self-pay

## 2018-07-02 DIAGNOSIS — R911 Solitary pulmonary nodule: Secondary | ICD-10-CM

## 2018-07-02 NOTE — Telephone Encounter (Signed)
Left message for the below appointments.  CT scan 08/02/18 @ 11 am @ [address]- 08/09/18 @ 11 am

## 2018-07-07 ENCOUNTER — Ambulatory Visit (INDEPENDENT_AMBULATORY_CARE_PROVIDER_SITE_OTHER): Payer: Medicare Other

## 2018-07-07 DIAGNOSIS — Z23 Encounter for immunization: Secondary | ICD-10-CM | POA: Diagnosis not present

## 2018-07-16 DIAGNOSIS — H18891 Other specified disorders of cornea, right eye: Secondary | ICD-10-CM | POA: Diagnosis not present

## 2018-07-16 DIAGNOSIS — Z961 Presence of intraocular lens: Secondary | ICD-10-CM | POA: Diagnosis not present

## 2018-07-16 DIAGNOSIS — H2512 Age-related nuclear cataract, left eye: Secondary | ICD-10-CM | POA: Diagnosis not present

## 2018-07-17 DIAGNOSIS — H18891 Other specified disorders of cornea, right eye: Secondary | ICD-10-CM | POA: Insufficient documentation

## 2018-08-02 ENCOUNTER — Ambulatory Visit
Admission: RE | Admit: 2018-08-02 | Discharge: 2018-08-02 | Disposition: A | Discharge status: Home / Self Care | Payer: Medicare Other | Source: Ambulatory Visit | Attending: Cardiothoracic Surgery | Admitting: Cardiothoracic Surgery

## 2018-08-02 DIAGNOSIS — I77810 Thoracic aortic ectasia: Secondary | ICD-10-CM | POA: Diagnosis not present

## 2018-08-02 DIAGNOSIS — J479 Bronchiectasis, uncomplicated: Secondary | ICD-10-CM | POA: Diagnosis not present

## 2018-08-02 DIAGNOSIS — R918 Other nonspecific abnormal finding of lung field: Secondary | ICD-10-CM | POA: Diagnosis not present

## 2018-08-02 DIAGNOSIS — I2510 Atherosclerotic heart disease of native coronary artery without angina pectoris: Secondary | ICD-10-CM | POA: Insufficient documentation

## 2018-08-02 DIAGNOSIS — R911 Solitary pulmonary nodule: Secondary | ICD-10-CM | POA: Diagnosis not present

## 2018-08-02 DIAGNOSIS — I700 Atherosclerosis of aorta: Secondary | ICD-10-CM | POA: Insufficient documentation

## 2018-08-09 ENCOUNTER — Other Ambulatory Visit: Payer: Self-pay

## 2018-08-09 ENCOUNTER — Encounter: Payer: Self-pay | Admitting: Cardiothoracic Surgery

## 2018-08-09 ENCOUNTER — Ambulatory Visit: Payer: Medicare Other | Admitting: Cardiothoracic Surgery

## 2018-08-09 VITALS — BP 124/68 | HR 64 | Temp 97.8°F | Ht 70.0 in | Wt 195.0 lb

## 2018-08-09 DIAGNOSIS — R911 Solitary pulmonary nodule: Secondary | ICD-10-CM

## 2018-08-09 NOTE — Patient Instructions (Signed)
Return as needed.The patient is aware to call back for any questions or concerns.  

## 2018-08-09 NOTE — Progress Notes (Signed)
  Patient ID: [name], male   DOB: November 24, 1925, 84 y.o.   MRN: [id number]  HISTORY: He returns today in follow-up.  He has had no chest pain or shortness of breath.  He has had no fevers or chills.  He states that he is getting along very well and has no medical complaints today.   Vitals:   08/09/18 1052  BP: 124/68  Pulse: 64  Temp: 97.8 F (36.6 C)  SpO2: 97%     EXAM:    Resp: Lungs are clear bilaterally.  No respiratory distress, normal effort. Heart:  Regular with loud systolic murmur Abd:  Abdomen is soft, non distended and non tender. No masses are palpable.  There is no rebound and no guarding.  Neurological: Alert and oriented to person, place, and time. Coordination normal.  Skin: Skin is warm and dry. No rash noted. No diaphoretic. No erythema. No pallor.  Psychiatric: Normal mood and affect. Normal behavior. Judgment and thought content normal.    ASSESSMENT: I have independently reviewed his CT scan.  The small pulmonary nodules are either stable or regressed.  The aorta is unchanged.   PLAN:   The official reading suggests that these are benign etiology and no further follow-up is required.  Therefore we will see him back on an as-needed basis.    Nestor Lewandowsky, MD

## 2018-08-19 DIAGNOSIS — H18891 Other specified disorders of cornea, right eye: Secondary | ICD-10-CM | POA: Diagnosis not present

## 2018-09-13 DIAGNOSIS — Z961 Presence of intraocular lens: Secondary | ICD-10-CM | POA: Diagnosis not present

## 2018-09-13 DIAGNOSIS — H18891 Other specified disorders of cornea, right eye: Secondary | ICD-10-CM | POA: Diagnosis not present

## 2018-09-13 DIAGNOSIS — H18421 Band keratopathy, right eye: Secondary | ICD-10-CM | POA: Diagnosis not present

## 2018-09-13 DIAGNOSIS — H2512 Age-related nuclear cataract, left eye: Secondary | ICD-10-CM | POA: Diagnosis not present

## 2018-10-05 ENCOUNTER — Other Ambulatory Visit (INDEPENDENT_AMBULATORY_CARE_PROVIDER_SITE_OTHER): Payer: Medicare Other

## 2018-10-05 DIAGNOSIS — G7080 Lambert-Eaton syndrome, unspecified: Secondary | ICD-10-CM | POA: Diagnosis not present

## 2018-10-05 DIAGNOSIS — I10 Essential (primary) hypertension: Secondary | ICD-10-CM

## 2018-10-05 DIAGNOSIS — E7800 Pure hypercholesterolemia, unspecified: Secondary | ICD-10-CM | POA: Diagnosis not present

## 2018-10-05 LAB — CBC WITH DIFFERENTIAL/PLATELET
BASOS ABS: 0 10*3/uL (ref 0.0–0.1)
Basophils Relative: 1 % (ref 0.0–3.0)
Eosinophils Absolute: 0.1 10*3/uL (ref 0.0–0.7)
Eosinophils Relative: 2 % (ref 0.0–5.0)
HCT: 39.9 % (ref 39.0–52.0)
Hemoglobin: 12.8 g/dL — ABNORMAL LOW (ref 13.0–17.0)
LYMPHS ABS: 1.1 10*3/uL (ref 0.7–4.0)
Lymphocytes Relative: 25.3 % (ref 12.0–46.0)
MCHC: 32 g/dL (ref 30.0–36.0)
MCV: 92.5 fl (ref 78.0–100.0)
MONO ABS: 0.5 10*3/uL (ref 0.1–1.0)
Monocytes Relative: 12.2 % — ABNORMAL HIGH (ref 3.0–12.0)
Neutro Abs: 2.6 10*3/uL (ref 1.4–7.7)
Neutrophils Relative %: 59.5 % (ref 43.0–77.0)
PLATELETS: 160 10*3/uL (ref 150.0–400.0)
RBC: 4.31 Mil/uL (ref 4.22–5.81)
RDW: 14.7 % (ref 11.5–15.5)
WBC: 4.4 10*3/uL (ref 4.0–10.5)

## 2018-10-05 LAB — BASIC METABOLIC PANEL
BUN: 20 mg/dL (ref 6–23)
CALCIUM: 9.4 mg/dL (ref 8.4–10.5)
CO2: 31 mEq/L (ref 19–32)
Chloride: 103 mEq/L (ref 96–112)
Creatinine, Ser: 1.13 mg/dL (ref 0.40–1.50)
GFR: 64.78 mL/min (ref >60.00)
Glucose, Bld: 102 mg/dL — ABNORMAL HIGH (ref 70–99)
Potassium: 5 mEq/L (ref 3.5–5.1)
Sodium: 139 mEq/L (ref 135–145)

## 2018-10-05 LAB — LIPID PANEL
CHOL/HDL RATIO: 3
Cholesterol: 138 mg/dL (ref 0–200)
HDL: 52.9 mg/dL (ref >39.00)
LDL Cholesterol: 70 mg/dL (ref 0–99)
NONHDL: 85.44
Triglycerides: 79 mg/dL (ref 0.0–149.0)
VLDL: 15.8 mg/dL (ref 0.0–40.0)

## 2018-10-05 LAB — HEPATIC FUNCTION PANEL
ALBUMIN: 4.1 g/dL (ref 3.5–5.2)
ALK PHOS: 55 U/L (ref 39–117)
ALT: 7 U/L (ref 0–53)
AST: 11 U/L (ref 0–37)
Bilirubin, Direct: 0.1 mg/dL (ref 0.0–0.3)
Total Bilirubin: 0.7 mg/dL (ref 0.2–1.2)
Total Protein: 6.3 g/dL (ref 6.0–8.3)

## 2018-10-06 ENCOUNTER — Other Ambulatory Visit: Payer: Self-pay | Admitting: Internal Medicine

## 2018-10-06 ENCOUNTER — Other Ambulatory Visit (INDEPENDENT_AMBULATORY_CARE_PROVIDER_SITE_OTHER): Payer: Medicare Other

## 2018-10-06 DIAGNOSIS — D649 Anemia, unspecified: Secondary | ICD-10-CM | POA: Diagnosis not present

## 2018-10-06 LAB — FERRITIN: Ferritin: 93.9 ng/mL (ref 22.0–322.0)

## 2018-10-06 LAB — IBC PANEL
IRON: 75 ug/dL (ref 42–165)
Saturation Ratios: 23 % (ref 20.0–50.0)
TRANSFERRIN: 233 mg/dL (ref 212.0–360.0)

## 2018-10-06 NOTE — Progress Notes (Signed)
Order placed for add on labs (iron studies)

## 2018-10-07 ENCOUNTER — Encounter: Payer: Self-pay | Admitting: Internal Medicine

## 2018-10-07 ENCOUNTER — Ambulatory Visit: Payer: Medicare Other | Admitting: Internal Medicine

## 2018-10-07 DIAGNOSIS — D649 Anemia, unspecified: Secondary | ICD-10-CM

## 2018-10-07 DIAGNOSIS — G7000 Myasthenia gravis without (acute) exacerbation: Secondary | ICD-10-CM

## 2018-10-07 DIAGNOSIS — I509 Heart failure, unspecified: Secondary | ICD-10-CM

## 2018-10-07 DIAGNOSIS — I712 Thoracic aortic aneurysm, without rupture, unspecified: Secondary | ICD-10-CM

## 2018-10-07 DIAGNOSIS — R9389 Abnormal findings on diagnostic imaging of other specified body structures: Secondary | ICD-10-CM

## 2018-10-07 DIAGNOSIS — E875 Hyperkalemia: Secondary | ICD-10-CM | POA: Diagnosis not present

## 2018-10-07 DIAGNOSIS — G7080 Lambert-Eaton syndrome, unspecified: Secondary | ICD-10-CM

## 2018-10-07 DIAGNOSIS — R600 Localized edema: Secondary | ICD-10-CM

## 2018-10-07 DIAGNOSIS — I10 Essential (primary) hypertension: Secondary | ICD-10-CM

## 2018-10-07 DIAGNOSIS — E7800 Pure hypercholesterolemia, unspecified: Secondary | ICD-10-CM

## 2018-10-07 DIAGNOSIS — R0989 Other specified symptoms and signs involving the circulatory and respiratory systems: Secondary | ICD-10-CM

## 2018-10-07 DIAGNOSIS — I2510 Atherosclerotic heart disease of native coronary artery without angina pectoris: Secondary | ICD-10-CM

## 2018-10-07 DIAGNOSIS — Z8551 Personal history of malignant neoplasm of bladder: Secondary | ICD-10-CM

## 2018-10-07 LAB — POTASSIUM: Potassium: 4.6 mEq/L (ref 3.5–5.1)

## 2018-10-07 NOTE — Progress Notes (Signed)
Patient ID: [name], male   DOB: 17-Feb-1926, 85 y.o.   MRN: [id number]   Subjective:    Patient ID: [name], male    DOB: 10/13/1925, 85 y.o.   MRN: [id number]  HPI  Patient here for a scheduled follow up.  He reports he is doing relatively well.  Wife having a lot of medical issues.  He feels he is handling things well.  Saw Dr Genevive Bi 08/09/18 for f/u pulmonary nodules.  CT chest - stable or regressed.  No further w/up warranted.  Has noticed increased bilateral lower extremity swelling.  Worsened recently.  A little more sedentary now.  No significant change with diet.  No chest pain.  Breathing stable.  No acid reflux.  No abdominal pain.  Bowels moving.  Due to f/u with urology at the end of this month.      Past Medical History:  Diagnosis Date  . Abnormal chest CT 03/28/2017  . Allergy   . Anemia   . Angina pectoris ([place]) 04/02/2011  . Aortic aneurysm ([place]) 03/28/2017   Saw Dr Genevive Bi 04/16/17 - recommended f/u CT in 3 months.    . Atherosclerosis of both carotid arteries 07/17/2014  . Benign lipomatous neoplasm of skin and subcutaneous tissue of left arm 10/04/2013  . Bilateral carotid artery stenosis 08/30/2014   Overview:  Less than 50% 2015  . Bladder cancer ([place]) 03/05/2013  . CAD (coronary artery disease) 06/18/2014  . Cancer ([place]) 11-24-12   bladder. BCG treatments by Dr Jacqlyn Larsen.  . Chicken pox   . Chronic prostatitis 09/30/2012  . Colon polyp   . Congestive heart failure ([place]) 04/20/2014   Overview:  Overview:  With anterior mi and moderate lv dyfunction ef 35%  . Dizziness 12/22/2016  . 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 Eaton-Lambert syndrome ([place])   . Elevated prostate specific antigen (PSA) 09/30/2012  . Essential hypertension, benign   . Gross hematuria 09/30/2012  . Herpes zoster 11/28/2011   Overview:  with ocular involvement OD  . Hypercholesterolemia   . Hypertension, benign 04/02/2011  . Hypotension   . Moderate mitral insufficiency 05/18/2015  .  Moderate tricuspid insufficiency 05/18/2015  . Myasthenia gravis ([place])   . Myasthenia gravis without exacerbation ([place]) 03/31/2011  . Shingles 2013   Past Surgical History:  Procedure Laterality Date  . APPENDECTOMY  1958  . CHOLECYSTECTOMY  1979  . HERNIA REPAIR  1970  . TONSILLECTOMY  1958   Family History  Problem Relation Age of Onset  . Lung cancer Sister   . Prostate cancer Brother   . Lung cancer Brother   . Arthritis Other        parent  . Colon cancer Neg Hx    Social History   Socioeconomic History  . Marital status: Married    Spouse name: Not on file  . Number of children: Not on file  . Years of education: Not on file  . Highest education level: Not on file  Occupational History  . Not on file  Social Needs  . Financial resource strain: Not on file  . Food insecurity:    Worry: Not on file    Inability: Not on file  . Transportation needs:    Medical: Not on file    Non-medical: Not on file  Tobacco Use  . Smoking status: Former Smoker    Last attempt to quit: 09/29/1958    Years since quitting: 60.0  . Smokeless tobacco: Never Used  Substance and Sexual Activity  . Alcohol use: Yes    Alcohol/week: 0.0 standard drinks    Comment: occasionally  . Drug use: No  . Sexual activity: Never  Lifestyle  . Physical activity:    Days per week: Not on file    Minutes per session: Not on file  . Stress: Not on file  Relationships  . Social connections:    Talks on phone: Not on file    Gets together: Not on file    Attends religious service: Not on file    Active member of club or organization: Not on file    Attends meetings of clubs or organizations: Not on file    Relationship status: Not on file  Other Topics Concern  . Not on file  Social History Narrative   Pt is married with 2 children - 1 son, 1 daughter. Previously self-employed in [employer]    Outpatient Encounter Medications as of 10/07/2018  Medication Sig  . acetaminophen (TYLENOL)  500 MG tablet Take 500 mg by mouth every 6 (six) hours as needed.  Marland Kitchen aspirin 81 MG tablet Take 81 mg by mouth daily.  . finasteride (PROSCAR) 5 MG tablet Take 5 mg by mouth daily.  . furosemide (LASIX) 20 MG tablet Take 20 mg by mouth daily.  . hydroxypropyl methylcellulose / hypromellose (ISOPTO TEARS / GONIOVISC) 2.5 % ophthalmic solution Place 1 drop into both eyes daily.  . isosorbide mononitrate (IMDUR) 30 MG 24 hr tablet Take 30 mg by mouth daily.  Marland Kitchen lisinopril (PRINIVIL,ZESTRIL) 5 MG tablet Take 5 mg by mouth daily.  . mycophenolate (CELLCEPT) 250 MG capsule Take 1,000 mg by mouth 2 (two) times daily.   . pravastatin (PRAVACHOL) 20 MG tablet Take 1 tablet (20 mg total) by mouth daily.  . prednisoLONE acetate (PRED FORTE) 1 % ophthalmic suspension Place 1 drop into the right eye 4 (four) times daily.  Marland Kitchen pyridostigmine (MESTINON) 60 MG tablet Take 30 mg by mouth 6 (six) times daily.   . tamsulosin (FLOMAX) 0.4 MG CAPS Take 0.4 mg by mouth daily.   No facility-administered encounter medications on file as of 10/07/2018.     Review of Systems  Constitutional: Negative for appetite change and unexpected weight change.  HENT: Negative for congestion and sinus pressure.   Respiratory: Negative for cough and chest tightness.        Breathing stable.    Cardiovascular: Positive for leg swelling. Negative for chest pain and palpitations.  Gastrointestinal: Negative for abdominal pain, diarrhea, nausea and vomiting.  Genitourinary: Negative for difficulty urinating and dysuria.  Musculoskeletal: Negative for joint swelling and myalgias.  Skin: Negative for color change.       Some erythema noted lower extremity - appears to be c/w stasis dermatitis.    Neurological: Negative for dizziness, light-headedness and headaches.  Psychiatric/Behavioral: Negative for agitation and dysphoric mood.       Objective:    Physical Exam Constitutional:      General: He is not in acute distress.     Appearance: Normal appearance. He is well-developed.  HENT:     Nose: Nose normal. No congestion.     Mouth/Throat:     Pharynx: No oropharyngeal exudate or posterior oropharyngeal erythema.  Cardiovascular:     Rate and Rhythm: Normal rate and regular rhythm.  Pulmonary:     Effort: Pulmonary effort is normal. No respiratory distress.     Breath sounds: Normal breath sounds.  Abdominal:  General: Bowel sounds are normal.     Palpations: Abdomen is soft.     Tenderness: There is no abdominal tenderness.  Musculoskeletal:        General: Swelling present.     Comments: Some pitting edema.  Unable to appreciate good DP pulses on exam.    Skin:    Comments: Erythema - lower extremities - appears to be c/w stasis changes.    Neurological:     Mental Status: He is alert.  Psychiatric:        Mood and Affect: Mood normal.        Behavior: Behavior normal.     BP 112/60 (BP Location: Left Arm, Patient Position: Sitting, Cuff Size: Normal)   Pulse 67   Temp 97.8 F (36.6 C) (Oral)   Resp 18   Wt 203 lb (92.1 kg)   SpO2 96%   BMI 29.13 kg/m  Wt Readings from Last 3 Encounters:  10/07/18 203 lb (92.1 kg)  08/09/18 195 lb (88.5 kg)  05/26/18 195 lb 12.8 oz (88.8 kg)     Lab Results  Component Value Date   WBC 4.4 10/05/2018   HGB 12.8 (L) 10/05/2018   HCT 39.9 10/05/2018   PLT 160.0 10/05/2018   GLUCOSE 102 (H) 10/05/2018   CHOL 138 10/05/2018   TRIG 79.0 10/05/2018   HDL 52.90 10/05/2018   LDLDIRECT 143.5 08/26/2013   LDLCALC 70 10/05/2018   ALT 7 10/05/2018   AST 11 10/05/2018   NA 139 10/05/2018   K 4.6 10/07/2018   CL 103 10/05/2018   CREATININE 1.13 10/05/2018   BUN 20 10/05/2018   CO2 31 10/05/2018   TSH 2.55 03/31/2018   HGBA1C 6.2 07/06/2017    Ct Chest Wo Contrast  Result Date: 08/02/2018 CLINICAL DATA:  85 year old male former smoker (quit 40 years ago). History of bladder cancer. Follow-up evaluation for lung nodule. EXAM: CT CHEST WITHOUT  CONTRAST TECHNIQUE: Multidetector CT imaging of the chest was performed following the standard protocol without IV contrast. COMPARISON:  Chest CT 07/30/2017. FINDINGS: Cardiovascular: Heart size is normal. There is no significant pericardial fluid, thickening or pericardial calcification. There is aortic atherosclerosis, as well as atherosclerosis of the great vessels of the mediastinum and the coronary arteries, including calcified atherosclerotic plaque in the left main, left anterior descending, left circumflex and right coronary arteries. Calcifications of the aortic valve. Ectasia of the isthmus of the thoracic aorta which measures up to 4.2 cm in diameter. Mediastinum/Nodes: No pathologically enlarged mediastinal or hilar lymph nodes. Please note that accurate exclusion of hilar adenopathy is limited on noncontrast CT scans. Esophagus is unremarkable in appearance. No axillary lymphadenopathy. Lungs/Pleura: Previously noted 4 mm ground-glass attenuation nodule in the left upper lobe has resolved, indicative of a benign lesion. There are other 2-4 mm pulmonary nodules in lungs bilaterally, stable compared to the prior study in retrospect, with the largest of these measuring 4 mm in the right lower lobe. No other larger more suspicious appearing pulmonary nodules or masses are noted. Scattered areas of mild cylindrical bronchiectasis, most evident in the left lower lobe and right upper lobe. No acute consolidative airspace disease. No pleural effusions. Upper Abdomen: Incompletely imaged exophytic high attenuation lesion measuring at least 1 cm in the upper pole of the left kidney anteriorly is not characterized on today's noncontrast CT examination, but is similar to prior studies, presumably a small proteinaceous/hemorrhagic cyst. Aortic atherosclerosis. Musculoskeletal: There are no aggressive appearing lytic or blastic lesions noted in  the visualized portions of the skeleton. IMPRESSION: 1. Tiny pulmonary  nodules have either resolved or are stable compared to the prior study, as above, measuring 4 mm or less in size, considered definitively benign requiring no future imaging follow-up. 2. Scattered areas of mild cylindrical bronchiectasis again noted, likely secondary to prior infections. 3. Aortic atherosclerosis, in addition to left main and 3 vessel coronary artery disease. 4. Ectasia of the isthmus of the thoracic aorta measuring up to 4.2 cm in diameter, unchanged compared to prior studies. 5. There are calcifications of the aortic valve. Echocardiographic correlation for evaluation of potential valvular dysfunction may be warranted if clinically indicated. Aortic Atherosclerosis (ICD10-I70.0). Electronically Signed   By: Vinnie Langton M.D.   On: 08/02/2018 12:47       Assessment & Plan:   Problem List Items Addressed This Visit    Abnormal chest CT    Saw Dr Genevive Bi 08/09/18 for f/u pulmonary nodules.  Stable or regressed.  No further f/u warranted.        Absent pulse in lower extremity    Unable to appreciate DP pulses on exam.  Will have vascular surgery evaluate.        Relevant Orders   Ambulatory referral to Vascular Surgery   Anemia    Follow cbc.        Aortic aneurysm [facility])    Just saw Dr Genevive Bi.        Bilateral lower extremity edema    Lower extremity swelling as outlined.  Discussed elevating legs.  Discussed compression hose. Unable to appreciate DP pulses.  Will have vascular surgery evaluate.        Relevant Orders   Ambulatory referral to Vascular Surgery   CAD (coronary artery disease)    Followed by cardiology.  Continue risk factor modification.        CHF (congestive heart failure) ([place])    Followed by cardiology.  Lower extremity swelling. No increased sob.  Feel lower extremity swelling more related to dependent edema and venostasis.  Hold on adding more diuretic.  Elevate legs.  Discussed compression stockings.  Have vascular evaluate.         Eaton-Lambert myasthenic syndrome [facility])    Has been followed by Dr Nadara Mustard.  Stable.  Continue current treatment regimen.        History of bladder cancer    Due to f/u with urology at the end of this month.  Planning to see dr Tamala Fothergill.        Hypercholesterolemia    On pravastatin.  Low cholesterol diet and exercise.  Follow lipid panel and liver function tests.        Hypertension, benign    Blood pressure under good control.   Follow pressures.  Follow metabolic panel.        Myasthenia gravis without exacerbation ([place])    Has been followed by Dr Nadara Mustard.  Stable.         Other Visit Diagnoses    Hyperkalemia       Recheck potassium.     Relevant Orders   Potassium (Completed)       [name], MD

## 2018-10-10 ENCOUNTER — Encounter: Payer: Self-pay | Admitting: Internal Medicine

## 2018-10-10 DIAGNOSIS — R600 Localized edema: Secondary | ICD-10-CM | POA: Insufficient documentation

## 2018-10-10 DIAGNOSIS — R0989 Other specified symptoms and signs involving the circulatory and respiratory systems: Secondary | ICD-10-CM | POA: Insufficient documentation

## 2018-10-10 NOTE — Assessment & Plan Note (Signed)
Blood pressure under good control.  Follow pressures.  Follow metabolic panel.   

## 2018-10-10 NOTE — Assessment & Plan Note (Signed)
Followed by cardiology.  Continue risk factor modification.   

## 2018-10-10 NOTE — Assessment & Plan Note (Signed)
Has been followed by Dr Nadara Mustard.  Stable.  Continue current treatment regimen.

## 2018-10-10 NOTE — Assessment & Plan Note (Signed)
Lower extremity swelling as outlined.  Discussed elevating legs.  Discussed compression hose. Unable to appreciate DP pulses.  Will have vascular surgery evaluate.

## 2018-10-10 NOTE — Assessment & Plan Note (Signed)
Just saw Dr Genevive Bi.

## 2018-10-10 NOTE — Assessment & Plan Note (Signed)
Followed by cardiology.  Lower extremity swelling. No increased sob.  Feel lower extremity swelling more related to dependent edema and venostasis.  Hold on adding more diuretic.  Elevate legs.  Discussed compression stockings.  Have vascular evaluate.

## 2018-10-10 NOTE — Assessment & Plan Note (Signed)
Saw Dr Genevive Bi 08/09/18 for f/u pulmonary nodules.  Stable or regressed.  No further f/u warranted.

## 2018-10-10 NOTE — Assessment & Plan Note (Signed)
On pravastatin.  Low cholesterol diet and exercise.  Follow lipid panel and liver function tests.   

## 2018-10-10 NOTE — Assessment & Plan Note (Signed)
Due to f/u with urology at the end of this month.  Planning to see dr Tamala Fothergill.

## 2018-10-10 NOTE — Assessment & Plan Note (Signed)
Unable to appreciate DP pulses on exam.  Will have vascular surgery evaluate.

## 2018-10-10 NOTE — Assessment & Plan Note (Signed)
Follow cbc.  

## 2018-10-10 NOTE — Assessment & Plan Note (Signed)
Has been followed by Dr Nadara Mustard.  Stable.

## 2018-10-11 ENCOUNTER — Other Ambulatory Visit (INDEPENDENT_AMBULATORY_CARE_PROVIDER_SITE_OTHER): Payer: Self-pay | Admitting: Vascular Surgery

## 2018-10-11 DIAGNOSIS — R0989 Other specified symptoms and signs involving the circulatory and respiratory systems: Secondary | ICD-10-CM

## 2018-10-12 ENCOUNTER — Ambulatory Visit (INDEPENDENT_AMBULATORY_CARE_PROVIDER_SITE_OTHER): Payer: Medicare Other

## 2018-10-12 ENCOUNTER — Encounter (INDEPENDENT_AMBULATORY_CARE_PROVIDER_SITE_OTHER): Payer: Self-pay | Admitting: Vascular Surgery

## 2018-10-12 ENCOUNTER — Ambulatory Visit (INDEPENDENT_AMBULATORY_CARE_PROVIDER_SITE_OTHER): Payer: Medicare Other | Admitting: Vascular Surgery

## 2018-10-12 VITALS — BP 130/65 | HR 64 | Resp 16 | Ht 70.0 in | Wt 199.6 lb

## 2018-10-12 DIAGNOSIS — I10 Essential (primary) hypertension: Secondary | ICD-10-CM | POA: Diagnosis not present

## 2018-10-12 DIAGNOSIS — E7800 Pure hypercholesterolemia, unspecified: Secondary | ICD-10-CM | POA: Diagnosis not present

## 2018-10-12 DIAGNOSIS — R0989 Other specified symptoms and signs involving the circulatory and respiratory systems: Secondary | ICD-10-CM

## 2018-10-12 DIAGNOSIS — M7989 Other specified soft tissue disorders: Secondary | ICD-10-CM | POA: Diagnosis not present

## 2018-10-12 NOTE — Assessment & Plan Note (Signed)
The patient swelling is improving but it is still present.  I have discussed that we can perform a venous reflux study at his convenience to evaluate the swelling. I would recommend elevation and compression stockings.  He is doing well with avoiding salt.  He would like to hold on the venous duplex at this time which is certainly reasonable as long as he is improving.  I have asked him to give it 1 month of conservative therapy and if it has not basically resolved at that point to call us and we will set him up for a venous duplex.

## 2018-10-12 NOTE — Assessment & Plan Note (Signed)
ABIs were performed today which were 1.2 bilaterally with triphasic waveforms consistent with no arterial insufficiency.  Lack of pulses were likely secondary to significant swelling but he does not appear to have worrisome arterial insufficiency.

## 2018-10-12 NOTE — Progress Notes (Signed)
Patient ID: [name], male   DOB: [birth year], 85 y.o.   MRN: [id number]  Chief Complaint  Patient presents with  . Establish Care    HPI [name] is a 85 y.o. male.  I am asked to see the patient by Dr. Nicki Reaper for evaluation of decreased pulses in the lower extremities.  The patient reports no significant pain in the legs but he had had some heaviness and leg swelling over the past several months.  He says the swelling is actually gone down after seeing Dr. Nicki Reaper.  He has been trying to avoid salt and elevate his legs.  He was recommended to start using compression stockings but had not yet started using those.  The left leg is the more swollen of the 2 legs.  No ulceration or infection.  His primary care physician was not able to feel adequate pulses and given his age and comorbidities concern for PAD was present.  ABIs were performed today which were 1.2 bilaterally with triphasic waveforms consistent with no arterial insufficiency.   Past Medical History:  Diagnosis Date  . Abnormal chest CT 03/28/2017  . Allergy   . Anemia   . Angina pectoris ([place]) 04/02/2011  . Aortic aneurysm ([place]) 03/28/2017   Saw Dr Genevive Bi 04/16/17 - recommended f/u CT in 3 months.    . Atherosclerosis of both carotid arteries 07/17/2014  . Benign lipomatous neoplasm of skin and subcutaneous tissue of left arm 10/04/2013  . Bilateral carotid artery stenosis 08/30/2014   Overview:  Less than 50% 2015  . Bladder cancer ([place]) 03/05/2013  . CAD (coronary artery disease) 06/18/2014  . Cancer ([place]) 11-24-12   bladder. BCG treatments by Dr Jacqlyn Larsen.  . Chicken pox   . Chronic prostatitis 09/30/2012  . Colon polyp   . Congestive heart failure ([place]) 04/20/2014   Overview:  Overview:  With anterior mi and moderate lv dyfunction ef 35%  . Dizziness 12/22/2016  . 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 Elevated prostate specific antigen (PSA) 09/30/2012  . Essential hypertension,  benign   . Gross hematuria 09/30/2012  . Herpes zoster 11/28/2011   Overview:  with ocular involvement OD  . Hypercholesterolemia   . Hypertension, benign 04/02/2011  . Hypotension   . Moderate mitral insufficiency 05/18/2015  . Moderate tricuspid insufficiency 05/18/2015  . Myasthenia gravis ([place])   . Myasthenia gravis without exacerbation ([place]) 03/31/2011  . Shingles 2013    Past Surgical History:  Procedure Laterality Date  . APPENDECTOMY  1958  . CHOLECYSTECTOMY  1979  . HERNIA REPAIR  1970  . TONSILLECTOMY  1958    Family History  Problem Relation Age of Onset  . Lung cancer Sister   . Prostate cancer Brother   . Lung cancer Brother   . Arthritis Other        parent  . Colon cancer Neg Hx     Social History Social History   Tobacco Use  . Smoking status: Former Smoker    Last attempt to quit: 09/29/1958    Years since quitting: 60.0  . Smokeless tobacco: Never Used  Substance Use Topics  . Alcohol use: Yes    Alcohol/week: 0.0 standard drinks    Comment: occasionally  . Drug use: No    Allergies  Allergen Reactions  . Beta Adrenergic Blockers Other (See Comments)    Beta Blocker will precipitate acute weakness in Lambert-Eaton Syndrome or Myasthenia Gravis  .  Calcium Channel Blockers Other (See Comments)    Calcium Channel Blocker- will precipitate acute weakness in Lambert-Eaton Syndrome.  . Ciprofloxacin Other (See Comments)    Last resort due to Myasthia Gravis  . Sulfa Antibiotics Rash    Current Outpatient Medications  Medication Sig Dispense Refill  . acetaminophen (TYLENOL) 500 MG tablet Take 500 mg by mouth every 6 (six) hours as needed.    Marland Kitchen aspirin 81 MG tablet Take 81 mg by mouth daily.    . finasteride (PROSCAR) 5 MG tablet Take 5 mg by mouth daily.    . furosemide (LASIX) 20 MG tablet Take 20 mg by mouth daily.    . hydroxypropyl methylcellulose / hypromellose (ISOPTO TEARS / GONIOVISC) 2.5 % ophthalmic solution Place 1 drop into both eyes daily.     . isosorbide mononitrate (IMDUR) 30 MG 24 hr tablet Take 30 mg by mouth daily.    Marland Kitchen lisinopril (PRINIVIL,ZESTRIL) 5 MG tablet Take 5 mg by mouth daily.    . mycophenolate (CELLCEPT) 250 MG capsule Take 1,000 mg by mouth 2 (two) times daily.     . pravastatin (PRAVACHOL) 20 MG tablet Take 1 tablet (20 mg total) by mouth daily. 90 tablet 1  . prednisoLONE acetate (PRED FORTE) 1 % ophthalmic suspension Place 1 drop into the right eye 4 (four) times daily.    Marland Kitchen pyridostigmine (MESTINON) 60 MG tablet Take 30 mg by mouth 6 (six) times daily.     . tamsulosin (FLOMAX) 0.4 MG CAPS Take 0.4 mg by mouth daily.     No current facility-administered medications for this visit.       REVIEW OF SYSTEMS (Negative unless checked)  Constitutional: [] Weight loss  [] Fever  [] Chills Cardiac: [] Chest pain   [] Chest pressure   [] Palpitations   [] Shortness of breath when laying flat   [] Shortness of breath at rest   [] Shortness of breath with exertion. Vascular:  [] Pain in legs with walking   [] Pain in legs at rest   [] Pain in legs when laying flat   [] Claudication   [] Pain in feet when walking  [] Pain in feet at rest  [] Pain in feet when laying flat   [] History of DVT   [] Phlebitis   [x] Swelling in legs   [] Varicose veins   [] Non-healing ulcers Pulmonary:   [] Uses home oxygen   [] Productive cough   [] Hemoptysis   [] Wheeze  [] COPD   [] Asthma Neurologic:  [] Dizziness  [] Blackouts   [] Seizures   [] History of stroke   [] History of TIA  [] Aphasia   [] Temporary blindness   [] Dysphagia   [] Weakness or numbness in arms   [] Weakness or numbness in legs Musculoskeletal:  [x] Arthritis   [] Joint swelling   [] Joint pain   [] Low back pain Hematologic:  [] Easy bruising  [] Easy bleeding   [] Hypercoagulable state   [x] Anemic  [] Hepatitis Gastrointestinal:  [] Blood in stool   [] Vomiting blood  [] Gastroesophageal reflux/heartburn   [] Abdominal pain Genitourinary:  [] Chronic kidney disease   [] Difficult urination  [] Frequent  urination  [] Burning with urination   [x] Hematuria Skin:  [] Rashes   [] Ulcers   [] Wounds Psychological:  [] History of anxiety   []  History of major depression.    Physical Exam BP 130/65 (BP Location: Left Arm, Patient Position: Sitting, Cuff Size: Normal)   Pulse 64   Resp 16   Ht 5\' 10"  (1.778 m)   Wt 199 lb 9.6 oz (90.5 kg)   BMI 28.64 kg/m  Gen:  WD/WN, NAD. Appears younger than stated age.  Head: [place]/AT, No temporalis wasting Ear/Nose/Throat: Hearing grossly intact, nares w/o erythema or drainage, oropharynx w/o Erythema/Exudate Eyes: Conjunctiva clear, sclera non-icteric  Neck: trachea midline.  Pulmonary:  Good air movement, respirations not labored, no use of accessory muscles Cardiac: RRR, no JVD Vascular:  Vessel Right Left  Radial Palpable Palpable                          PT Trace Palpable Not Palpable  DP 1+ Palpable 1+ Palpable   Gastrointestinal: soft, non-tender/non-distended.  Musculoskeletal: M/S 5/5 throughout.  Extremities without ischemic changes.  No deformity or atrophy. 1+ RLE edema, 1-2+ LLE edema. Neurologic: Sensation grossly intact in extremities.  Symmetrical.  Speech is fluent. Motor exam as listed above. Psychiatric: Judgment intact, Mood & affect appropriate for pt's clinical situation. Dermatologic: No rashes or ulcers noted.  No cellulitis or open wounds.   Radiology No results found.  Labs Recent Results (from the past 2160 hour(s))  Basic metabolic panel     Status: Abnormal   Collection Time: 10/05/18  8:31 AM  Result Value Ref Range   Sodium 139 135 - 145 mEq/L   Potassium 5.0 3.5 - 5.1 mEq/L   Chloride 103 96 - 112 mEq/L   CO2 31 19 - 32 mEq/L   Glucose, Bld 102 (H) 70 - 99 mg/dL   BUN 20 6 - 23 mg/dL   Creatinine, Ser 1.13 0.40 - 1.50 mg/dL   Calcium 9.4 8.4 - 10.5 mg/dL   GFR 64.78 >60.00 mL/min  CBC with Differential/Platelet     Status: Abnormal   Collection Time: 10/05/18  8:31 AM  Result Value Ref Range   WBC 4.4  4.0 - 10.5 K/uL   RBC 4.31 4.22 - 5.81 Mil/uL   Hemoglobin 12.8 (L) 13.0 - 17.0 g/dL   HCT 39.9 39.0 - 52.0 %   MCV 92.5 78.0 - 100.0 fl   MCHC 32.0 30.0 - 36.0 g/dL   RDW 14.7 11.5 - 15.5 %   Platelets 160.0 150.0 - 400.0 K/uL   Neutrophils Relative % 59.5 43.0 - 77.0 %   Lymphocytes Relative 25.3 12.0 - 46.0 %   Monocytes Relative 12.2 (H) 3.0 - 12.0 %   Eosinophils Relative 2.0 0.0 - 5.0 %   Basophils Relative 1.0 0.0 - 3.0 %   Neutro Abs 2.6 1.4 - 7.7 K/uL   Lymphs Abs 1.1 0.7 - 4.0 K/uL   Monocytes Absolute 0.5 0.1 - 1.0 K/uL   Eosinophils Absolute 0.1 0.0 - 0.7 K/uL   Basophils Absolute 0.0 0.0 - 0.1 K/uL  Hepatic function panel     Status: None   Collection Time: 10/05/18  8:31 AM  Result Value Ref Range   Total Bilirubin 0.7 0.2 - 1.2 mg/dL   Bilirubin, Direct 0.1 0.0 - 0.3 mg/dL   Alkaline Phosphatase 55 39 - 117 U/L   AST 11 0 - 37 U/L   ALT 7 0 - 53 U/L   Total Protein 6.3 6.0 - 8.3 g/dL   Albumin 4.1 3.5 - 5.2 g/dL  Lipid panel     Status: None   Collection Time: 10/05/18  8:31 AM  Result Value Ref Range   Cholesterol 138 0 - 200 mg/dL    Comment: ATP III Classification       Desirable:  < 200 mg/dL               Borderline High:  200 - 239 mg/dL  High:  > = 240 mg/dL   Triglycerides 79.0 0.0 - 149.0 mg/dL    Comment: Normal:  <150 mg/dLBorderline High:  150 - 199 mg/dL   HDL 52.90 >39.00 mg/dL   VLDL 15.8 0.0 - 40.0 mg/dL   LDL Cholesterol 70 0 - 99 mg/dL   Total CHOL/HDL Ratio 3     Comment:                Men          Women1/2 Average Risk     3.4          3.3Average Risk          5.0          4.42X Average Risk          9.6          7.13X Average Risk          15.0          11.0                       NonHDL 85.44     Comment: NOTE:  Non-HDL goal should be 30 mg/dL higher than patient's LDL goal (i.e. LDL goal of < 70 mg/dL, would have non-HDL goal of < 100 mg/dL)  IBC panel     Status: None   Collection Time: 10/06/18  1:06 PM  Result Value Ref  Range   Iron 75 42 - 165 ug/dL   Transferrin 233.0 212.0 - 360.0 mg/dL   Saturation Ratios 23.0 20.0 - 50.0 %  Ferritin     Status: None   Collection Time: 10/06/18  1:06 PM  Result Value Ref Range   Ferritin 93.9 22.0 - 322.0 ng/mL  Potassium     Status: None   Collection Time: 10/07/18 11:29 AM  Result Value Ref Range   Potassium 4.6 3.5 - 5.1 mEq/L    Assessment/Plan:  Hypercholesterolemia lipid control important in reducing the progression of atherosclerotic disease. Continue statin therapy   Benign essential hypertension blood pressure control important in reducing the progression of atherosclerotic disease. On appropriate oral medications.   Absent pulse in lower extremity  ABIs were performed today which were 1.2 bilaterally with triphasic waveforms consistent with no arterial insufficiency.  Lack of pulses were likely secondary to significant swelling but he does not appear to have worrisome arterial insufficiency.  Swelling of limb The patient swelling is improving but it is still present.  I have discussed that we can perform a venous reflux study at his convenience to evaluate the swelling. I would recommend elevation and compression stockings.  He is doing well with avoiding salt.  He would like to hold on the venous duplex at this time which is certainly reasonable as long as he is improving.  I have asked him to give it 1 month of conservative therapy and if it has not basically resolved at that point to call us and we will set him up for a venous duplex.      Leotis Pain 10/12/2018, 3:45 PM   This note was created with Dragon medical transcription system.  Any errors from dictation are unintentional.

## 2018-10-12 NOTE — Patient Instructions (Signed)

## 2018-10-12 NOTE — Assessment & Plan Note (Signed)
blood pressure control important in reducing the progression of atherosclerotic disease. On appropriate oral medications.  

## 2018-10-12 NOTE — Assessment & Plan Note (Signed)
lipid control important in reducing the progression of atherosclerotic disease. Continue statin therapy  

## 2018-10-13 DIAGNOSIS — I2510 Atherosclerotic heart disease of native coronary artery without angina pectoris: Secondary | ICD-10-CM | POA: Diagnosis not present

## 2018-10-13 DIAGNOSIS — I10 Essential (primary) hypertension: Secondary | ICD-10-CM | POA: Diagnosis not present

## 2018-10-13 DIAGNOSIS — I5022 Chronic systolic (congestive) heart failure: Secondary | ICD-10-CM | POA: Diagnosis not present

## 2018-10-13 DIAGNOSIS — E782 Mixed hyperlipidemia: Secondary | ICD-10-CM | POA: Diagnosis not present

## 2018-10-13 DIAGNOSIS — I872 Venous insufficiency (chronic) (peripheral): Secondary | ICD-10-CM | POA: Insufficient documentation

## 2018-10-26 DIAGNOSIS — I5022 Chronic systolic (congestive) heart failure: Secondary | ICD-10-CM | POA: Diagnosis not present

## 2018-10-26 DIAGNOSIS — I2510 Atherosclerotic heart disease of native coronary artery without angina pectoris: Secondary | ICD-10-CM | POA: Diagnosis not present

## 2018-10-28 DIAGNOSIS — Z8551 Personal history of malignant neoplasm of bladder: Secondary | ICD-10-CM | POA: Diagnosis not present

## 2018-11-03 DIAGNOSIS — L820 Inflamed seborrheic keratosis: Secondary | ICD-10-CM | POA: Diagnosis not present

## 2018-11-03 DIAGNOSIS — L821 Other seborrheic keratosis: Secondary | ICD-10-CM | POA: Diagnosis not present

## 2018-11-03 DIAGNOSIS — L578 Other skin changes due to chronic exposure to nonionizing radiation: Secondary | ICD-10-CM | POA: Diagnosis not present

## 2018-11-03 DIAGNOSIS — C44329 Squamous cell carcinoma of skin of other parts of face: Secondary | ICD-10-CM | POA: Diagnosis not present

## 2018-11-05 DIAGNOSIS — I10 Essential (primary) hypertension: Secondary | ICD-10-CM | POA: Diagnosis not present

## 2018-11-05 DIAGNOSIS — I340 Nonrheumatic mitral (valve) insufficiency: Secondary | ICD-10-CM | POA: Diagnosis not present

## 2018-11-05 DIAGNOSIS — I2510 Atherosclerotic heart disease of native coronary artery without angina pectoris: Secondary | ICD-10-CM | POA: Diagnosis not present

## 2018-11-05 DIAGNOSIS — I5022 Chronic systolic (congestive) heart failure: Secondary | ICD-10-CM | POA: Diagnosis not present

## 2018-11-11 DIAGNOSIS — E785 Hyperlipidemia, unspecified: Secondary | ICD-10-CM | POA: Diagnosis not present

## 2018-11-11 DIAGNOSIS — G7000 Myasthenia gravis without (acute) exacerbation: Secondary | ICD-10-CM | POA: Diagnosis not present

## 2018-11-11 DIAGNOSIS — I509 Heart failure, unspecified: Secondary | ICD-10-CM | POA: Diagnosis not present

## 2018-11-11 DIAGNOSIS — I10 Essential (primary) hypertension: Secondary | ICD-10-CM | POA: Diagnosis not present

## 2018-11-25 DIAGNOSIS — Z87891 Personal history of nicotine dependence: Secondary | ICD-10-CM | POA: Diagnosis not present

## 2018-11-25 DIAGNOSIS — I2510 Atherosclerotic heart disease of native coronary artery without angina pectoris: Secondary | ICD-10-CM | POA: Diagnosis not present

## 2018-11-25 DIAGNOSIS — I252 Old myocardial infarction: Secondary | ICD-10-CM | POA: Diagnosis not present

## 2018-11-25 DIAGNOSIS — Z7982 Long term (current) use of aspirin: Secondary | ICD-10-CM | POA: Diagnosis not present

## 2018-11-25 DIAGNOSIS — I10 Essential (primary) hypertension: Secondary | ICD-10-CM | POA: Diagnosis not present

## 2018-11-25 DIAGNOSIS — H5703 Miosis: Secondary | ICD-10-CM | POA: Diagnosis not present

## 2018-11-25 DIAGNOSIS — H2512 Age-related nuclear cataract, left eye: Secondary | ICD-10-CM | POA: Diagnosis not present

## 2018-11-25 DIAGNOSIS — Z882 Allergy status to sulfonamides status: Secondary | ICD-10-CM | POA: Diagnosis not present

## 2018-11-25 DIAGNOSIS — E785 Hyperlipidemia, unspecified: Secondary | ICD-10-CM | POA: Diagnosis not present

## 2018-11-26 DIAGNOSIS — Z961 Presence of intraocular lens: Secondary | ICD-10-CM | POA: Insufficient documentation

## 2019-01-18 ENCOUNTER — Telehealth: Payer: Self-pay | Admitting: *Deleted

## 2019-01-18 DIAGNOSIS — R739 Hyperglycemia, unspecified: Secondary | ICD-10-CM

## 2019-01-18 DIAGNOSIS — D649 Anemia, unspecified: Secondary | ICD-10-CM

## 2019-01-18 DIAGNOSIS — I10 Essential (primary) hypertension: Secondary | ICD-10-CM

## 2019-01-18 DIAGNOSIS — E782 Mixed hyperlipidemia: Secondary | ICD-10-CM

## 2019-01-18 DIAGNOSIS — E7800 Pure hypercholesterolemia, unspecified: Secondary | ICD-10-CM

## 2019-01-18 NOTE — Telephone Encounter (Signed)
Pt has lab appt on 4/22. Please place future orders.

## 2019-01-18 NOTE — Telephone Encounter (Signed)
Orders placed for f/u labs.  

## 2019-01-19 ENCOUNTER — Other Ambulatory Visit: Payer: Self-pay

## 2019-01-19 ENCOUNTER — Other Ambulatory Visit (INDEPENDENT_AMBULATORY_CARE_PROVIDER_SITE_OTHER): Payer: Medicare Other

## 2019-01-19 DIAGNOSIS — R739 Hyperglycemia, unspecified: Secondary | ICD-10-CM

## 2019-01-19 DIAGNOSIS — E7800 Pure hypercholesterolemia, unspecified: Secondary | ICD-10-CM

## 2019-01-19 DIAGNOSIS — I10 Essential (primary) hypertension: Secondary | ICD-10-CM | POA: Diagnosis not present

## 2019-01-19 DIAGNOSIS — D649 Anemia, unspecified: Secondary | ICD-10-CM | POA: Diagnosis not present

## 2019-01-19 LAB — LIPID PANEL
Cholesterol: 139 mg/dL (ref 0–200)
HDL: 52.6 mg/dL (ref >39.00)
LDL Cholesterol: 72 mg/dL (ref 0–99)
NonHDL: 86.86
Total CHOL/HDL Ratio: 3
Triglycerides: 74 mg/dL (ref 0.0–149.0)
VLDL: 14.8 mg/dL (ref 0.0–40.0)

## 2019-01-19 LAB — CBC WITH DIFFERENTIAL/PLATELET
Basophils Absolute: 0 10*3/uL (ref 0.0–0.1)
Basophils Relative: 0.7 % (ref 0.0–3.0)
Eosinophils Absolute: 0.1 10*3/uL (ref 0.0–0.7)
Eosinophils Relative: 1.7 % (ref 0.0–5.0)
HCT: 40.1 % (ref 39.0–52.0)
Hemoglobin: 12.9 g/dL — ABNORMAL LOW (ref 13.0–17.0)
Lymphocytes Relative: 18.5 % (ref 12.0–46.0)
Lymphs Abs: 1.1 10*3/uL (ref 0.7–4.0)
MCHC: 32.2 g/dL (ref 30.0–36.0)
MCV: 91.2 fl (ref 78.0–100.0)
Monocytes Absolute: 0.6 10*3/uL (ref 0.1–1.0)
Monocytes Relative: 9.8 % (ref 3.0–12.0)
Neutro Abs: 4.1 10*3/uL (ref 1.4–7.7)
Neutrophils Relative %: 69.3 % (ref 43.0–77.0)
Platelets: 155 10*3/uL (ref 150.0–400.0)
RBC: 4.4 Mil/uL (ref 4.22–5.81)
RDW: 14.4 % (ref 11.5–15.5)
WBC: 6 10*3/uL (ref 4.0–10.5)

## 2019-01-19 LAB — HEPATIC FUNCTION PANEL
ALT: 8 U/L (ref 0–53)
AST: 10 U/L (ref 0–37)
Albumin: 4.2 g/dL (ref 3.5–5.2)
Alkaline Phosphatase: 56 U/L (ref 39–117)
Bilirubin, Direct: 0.2 mg/dL (ref 0.0–0.3)
Total Bilirubin: 0.8 mg/dL (ref 0.2–1.2)
Total Protein: 6.2 g/dL (ref 6.0–8.3)

## 2019-01-19 LAB — HEMOGLOBIN A1C: Hgb A1c MFr Bld: 6.1 % (ref 4.6–6.5)

## 2019-01-19 LAB — BASIC METABOLIC PANEL
BUN: 26 mg/dL — ABNORMAL HIGH (ref 6–23)
CO2: 30 mEq/L (ref 19–32)
Calcium: 9.1 mg/dL (ref 8.4–10.5)
Chloride: 103 mEq/L (ref 96–112)
Creatinine, Ser: 1.07 mg/dL (ref 0.40–1.50)
GFR: 64.87 mL/min (ref >60.00)
Glucose, Bld: 96 mg/dL (ref 70–99)
Potassium: 4.9 mEq/L (ref 3.5–5.1)
Sodium: 140 mEq/L (ref 135–145)

## 2019-01-19 LAB — FERRITIN: Ferritin: 78.3 ng/mL (ref 22.0–322.0)

## 2019-01-20 ENCOUNTER — Ambulatory Visit (INDEPENDENT_AMBULATORY_CARE_PROVIDER_SITE_OTHER): Payer: Medicare Other | Admitting: Internal Medicine

## 2019-01-20 ENCOUNTER — Encounter: Payer: Self-pay | Admitting: Internal Medicine

## 2019-01-20 DIAGNOSIS — I10 Essential (primary) hypertension: Secondary | ICD-10-CM | POA: Diagnosis not present

## 2019-01-20 DIAGNOSIS — R600 Localized edema: Secondary | ICD-10-CM | POA: Diagnosis not present

## 2019-01-20 DIAGNOSIS — D649 Anemia, unspecified: Secondary | ICD-10-CM | POA: Diagnosis not present

## 2019-01-20 DIAGNOSIS — E7800 Pure hypercholesterolemia, unspecified: Secondary | ICD-10-CM

## 2019-01-20 DIAGNOSIS — I712 Thoracic aortic aneurysm, without rupture, unspecified: Secondary | ICD-10-CM

## 2019-01-20 DIAGNOSIS — Z8551 Personal history of malignant neoplasm of bladder: Secondary | ICD-10-CM

## 2019-01-20 DIAGNOSIS — G7080 Lambert-Eaton syndrome, unspecified: Secondary | ICD-10-CM

## 2019-01-20 DIAGNOSIS — I2510 Atherosclerotic heart disease of native coronary artery without angina pectoris: Secondary | ICD-10-CM

## 2019-01-20 NOTE — Progress Notes (Addendum)
Patient ID: [name], male   DOB: November 30, 1925, 85 y.o.   MRN: [id number] Virtual Visit via Telephone Note  This visit type was conducted due to national recommendations for restrictions regarding the COVID-19 pandemic (e.g. social distancing).  This format is felt to be most appropriate for this patient at this time.  All issues noted in this document were discussed and addressed.  No physical exam was performed (except for noted visual exam findings with Video Visits).   I connected with Debera Lat by telephone and verified that I am speaking with the correct person using two identifiers. Location patient: home Location provider: work  Persons participating in the telephone visit: patient, provider  I discussed the limitations, risks, security and privacy concerns of performing an evaluation and management service by telephone and the availability of in person appointments. The patient expressed understanding and agreed to proceed.   Reason for visit: scheduled follow up.    HPI: He reports he is doing well.  Breathing stable. No sob.  Trying to stay in.  No cough or congestion.  No chest pain.  No sob.  No known COVID exposure.  Saw cardiology 10/2018.  Felt stable.  No acid reflux.  Eating well.  No abdominal pain.  Bowels moving.  No urine change.  Blood pressure has been ok.  Discussed lab results.  Discussed continued diet and exercise.  Legs are better.  No significant swelling.  Handling stress.     ROS: See pertinent positives and negatives per HPI.  Past Medical History:  Diagnosis Date  . Abnormal chest CT 03/28/2017  . Allergy   . Anemia   . Angina pectoris ([place]) 04/02/2011  . Aortic aneurysm ([place]) 03/28/2017   Saw Dr Genevive Bi 04/16/17 - recommended f/u CT in 3 months.    . Atherosclerosis of both carotid arteries 07/17/2014  . Benign lipomatous neoplasm of skin and subcutaneous tissue of left arm 10/04/2013  . Bilateral carotid artery stenosis 08/30/2014   Overview:  Less than  50% 2015  . Bladder cancer ([place]) 03/05/2013  . CAD (coronary artery disease) 06/18/2014  . Cancer ([place]) 11-24-12   bladder. BCG treatments by Dr Jacqlyn Larsen.  . Chicken pox   . Chronic prostatitis 09/30/2012  . Colon polyp   . Congestive heart failure ([place]) 04/20/2014   Overview:  Overview:  With anterior mi and moderate lv dyfunction ef 35%  . Dizziness 12/22/2016  . 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 Eaton-Lambert syndrome ([place])   . Elevated prostate specific antigen (PSA) 09/30/2012  . Essential hypertension, benign   . Gross hematuria 09/30/2012  . Herpes zoster 11/28/2011   Overview:  with ocular involvement OD  . Hypercholesterolemia   . Hypertension, benign 04/02/2011  . Hypotension   . Moderate mitral insufficiency 05/18/2015  . Moderate tricuspid insufficiency 05/18/2015  . Myasthenia gravis ([place])   . Myasthenia gravis without exacerbation ([place]) 03/31/2011  . Shingles 2013    Past Surgical History:  Procedure Laterality Date  . APPENDECTOMY  1958  . CHOLECYSTECTOMY  1979  . HERNIA REPAIR  1970  . TONSILLECTOMY  1958    Family History  Problem Relation Age of Onset  . Lung cancer Sister   . Prostate cancer Brother   . Lung cancer Brother   . Arthritis Other        parent  . Colon cancer Neg Hx     SOCIAL HX: reviewed.    Current Outpatient Medications:  .  acetaminophen (TYLENOL) 500 MG  tablet, Take 500 mg by mouth every 6 (six) hours as needed., Disp: , Rfl:  .  aspirin 81 MG tablet, Take 81 mg by mouth daily., Disp: , Rfl:  .  finasteride (PROSCAR) 5 MG tablet, Take 5 mg by mouth daily., Disp: , Rfl:  .  furosemide (LASIX) 20 MG tablet, Take 20 mg by mouth daily., Disp: , Rfl:  .  hydroxypropyl methylcellulose / hypromellose (ISOPTO TEARS / GONIOVISC) 2.5 % ophthalmic solution, Place 1 drop into both eyes daily., Disp: , Rfl:  .  isosorbide mononitrate (IMDUR) 30 MG 24 hr tablet, Take 30 mg by mouth daily., Disp: , Rfl:  .  lisinopril (PRINIVIL,ZESTRIL) 5 MG  tablet, Take 5 mg by mouth daily., Disp: , Rfl:  .  mycophenolate (CELLCEPT) 250 MG capsule, Take 1,000 mg by mouth 2 (two) times daily. , Disp: , Rfl:  .  pravastatin (PRAVACHOL) 20 MG tablet, Take 1 tablet (20 mg total) by mouth daily., Disp: 90 tablet, Rfl: 1 .  prednisoLONE acetate (PRED FORTE) 1 % ophthalmic suspension, Place 1 drop into the right eye 4 (four) times daily., Disp: , Rfl:  .  pyridostigmine (MESTINON) 60 MG tablet, Take 30 mg by mouth 6 (six) times daily. , Disp: , Rfl:  .  tamsulosin (FLOMAX) 0.4 MG CAPS, Take 0.4 mg by mouth daily., Disp: , Rfl:   EXAM:  GENERAL: alert.  Answering questions appropriately.  Sounds to be in no acute distress.    PSYCH/NEURO: pleasant and cooperative, no obvious depression or anxiety, speech and thought processing grossly intact  ASSESSMENT AND PLAN:  Discussed the following assessment and plan:  Anemia, unspecified type  Thoracic aortic aneurysm without rupture (HCC)  Benign essential hypertension  Bilateral lower extremity edema  Coronary artery disease involving native coronary artery of native heart without angina pectoris  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HCC)  History of bladder cancer  Hypercholesterolemia  Hypertension, benign  Anemia hgb just checked 01/19/19 - 12.9.  Stable.  Follow.    Aortic aneurysm ([place]) Evaluated by Dr Genevive Bi.    Benign essential hypertension Blood pressure has been doing well.  Continue current medication regimen.  Follow pressures.  Follow metabolic panel.    Bilateral lower extremity edema Saw [facility].  Swelling better. Not a significant issue now.  Follow.    CAD (coronary artery disease) Saw cardiology 10/2018.  Stable.  Continue risk factor modification.    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  Stable.  Continue current medication regimen.  Follow cbc.   History of bladder cancer Followed by urology.    Hypercholesterolemia On pravastatin.  Low cholesterol  diet and exercise.  Follow lipid panel and liver function tests.    Hypertension, benign Blood pressure has been doing well.  Same medication regimen.  Follow pressures.  Follow metabolic panel.     I discussed the assessment and treatment plan with the patient. The patient was provided an opportunity to ask questions and all were answered. The patient agreed with the plan and demonstrated an understanding of the instructions.   The patient was advised to call back or seek an in-person evaluation if the symptoms worsen or if the condition fails to improve as anticipated.  I provided 15 minutes of non-face-to-face time during this encounter.   [name], MD

## 2019-01-23 ENCOUNTER — Encounter: Payer: Self-pay | Admitting: Internal Medicine

## 2019-01-23 NOTE — Assessment & Plan Note (Signed)
hgb just checked 01/19/19 - 12.9.  Stable.  Follow.

## 2019-01-23 NOTE — Assessment & Plan Note (Signed)
Followed by neurology.  Stable.  Continue current medication regimen.  Follow cbc.

## 2019-01-23 NOTE — Assessment & Plan Note (Signed)
Blood pressure has been doing well.  Same medication regimen.  Follow pressures.  Follow metabolic panel.   

## 2019-01-23 NOTE — Assessment & Plan Note (Signed)
Evaluated by Dr Genevive Bi.

## 2019-01-23 NOTE — Assessment & Plan Note (Signed)
Saw cardiology 10/2018.  Stable.  Continue risk factor modification.

## 2019-01-23 NOTE — Assessment & Plan Note (Signed)
On pravastatin.  Low cholesterol diet and exercise.  Follow lipid panel and liver function tests.   

## 2019-01-23 NOTE — Assessment & Plan Note (Signed)
Followed by urology.   

## 2019-01-23 NOTE — Assessment & Plan Note (Signed)
Saw [facility].  Swelling better. Not a significant issue now.  Follow.

## 2019-01-23 NOTE — Assessment & Plan Note (Signed)
Blood pressure has been doing well.  Continue current medication regimen.  Follow pressures.  Follow metabolic panel.  

## 2019-02-02 DIAGNOSIS — L578 Other skin changes due to chronic exposure to nonionizing radiation: Secondary | ICD-10-CM | POA: Diagnosis not present

## 2019-02-02 DIAGNOSIS — L570 Actinic keratosis: Secondary | ICD-10-CM | POA: Diagnosis not present

## 2019-02-02 DIAGNOSIS — L905 Scar conditions and fibrosis of skin: Secondary | ICD-10-CM | POA: Diagnosis not present

## 2019-02-02 DIAGNOSIS — L820 Inflamed seborrheic keratosis: Secondary | ICD-10-CM | POA: Diagnosis not present

## 2019-02-02 DIAGNOSIS — C44622 Squamous cell carcinoma of skin of right upper limb, including shoulder: Secondary | ICD-10-CM | POA: Diagnosis not present

## 2019-03-17 DIAGNOSIS — B028 Zoster with other complications: Secondary | ICD-10-CM | POA: Diagnosis not present

## 2019-03-22 DIAGNOSIS — S0501XA Injury of conjunctiva and corneal abrasion without foreign body, right eye, initial encounter: Secondary | ICD-10-CM | POA: Diagnosis not present

## 2019-03-23 DIAGNOSIS — S0501XD Injury of conjunctiva and corneal abrasion without foreign body, right eye, subsequent encounter: Secondary | ICD-10-CM | POA: Diagnosis not present

## 2019-03-24 DIAGNOSIS — H18421 Band keratopathy, right eye: Secondary | ICD-10-CM | POA: Diagnosis not present

## 2019-03-24 DIAGNOSIS — Z961 Presence of intraocular lens: Secondary | ICD-10-CM | POA: Diagnosis not present

## 2019-03-28 DIAGNOSIS — H18421 Band keratopathy, right eye: Secondary | ICD-10-CM | POA: Diagnosis not present

## 2019-04-06 DIAGNOSIS — H18421 Band keratopathy, right eye: Secondary | ICD-10-CM | POA: Diagnosis not present

## 2019-04-07 DIAGNOSIS — B028 Zoster with other complications: Secondary | ICD-10-CM | POA: Diagnosis not present

## 2019-04-12 DIAGNOSIS — H18421 Band keratopathy, right eye: Secondary | ICD-10-CM | POA: Diagnosis not present

## 2019-04-19 DIAGNOSIS — B028 Zoster with other complications: Secondary | ICD-10-CM | POA: Diagnosis not present

## 2019-05-04 DIAGNOSIS — B028 Zoster with other complications: Secondary | ICD-10-CM | POA: Diagnosis not present

## 2019-05-10 ENCOUNTER — Ambulatory Visit (INDEPENDENT_AMBULATORY_CARE_PROVIDER_SITE_OTHER): Payer: Medicare Other

## 2019-05-10 ENCOUNTER — Other Ambulatory Visit: Payer: Self-pay

## 2019-05-10 DIAGNOSIS — Z0000 Encounter for general adult medical examination without abnormal findings: Secondary | ICD-10-CM | POA: Diagnosis not present

## 2019-05-10 NOTE — Patient Instructions (Addendum)
  [name] , Thank you for taking time to come for your Medicare Wellness Visit. I appreciate your ongoing commitment to your health goals. Please review the following plan we discussed and let me know if I can assist you in the future.   These are the goals we discussed: Goals    . Follow up with Primary Care Provider     As needed       This is a list of the screening recommended for you and due dates:  Health Maintenance  Topic Date Due  . Tetanus Vaccine  09[birth year]05/1947  . Flu Shot  04/30/2019  . Pneumonia vaccines  Completed

## 2019-05-10 NOTE — Progress Notes (Signed)
Subjective:   [name] is a 85 y.o. male who presents for Medicare Annual/Subsequent preventive examination.  Review of Systems:  No ROS.  Medicare Wellness Virtual Visit.  Visual/audio telehealth visit, UTA vital signs.   See social history for additional risk factors.   Cardiac Risk Factors include: advanced age (>67men, >73 women);male gender;hypertension     Objective:    Vitals: There were no vitals taken for this visit.  There is no height or weight on file to calculate BMI.  Advanced Directives 05/10/2019 08/10/2017 12/27/2016 12/27/2016 09/09/2016 09/10/2015  Does Patient Have a Medical Advance Directive? Yes No No No No Yes  Type of Paramedic of [place];Living will - - - - Press photographer  Does patient want to make changes to medical advance directive? No - Patient declined - - - - No - Patient declined  Copy of [place] in Chart? No - copy requested - - - - No - copy requested  Would patient like information on creating a medical advance directive? - - Yes (ED - Information included in AVS) Yes (ED - Information included in AVS) No - Patient declined -    Tobacco Social History   Tobacco Use  Smoking Status Former Smoker  . Quit date: 09/29/1958  . Years since quitting: 60.6  Smokeless Tobacco Never Used     Counseling given: Not Answered   Clinical Intake:                       Past Medical History:  Diagnosis Date  . Abnormal chest CT 03/28/2017  . Allergy   . Anemia   . Angina pectoris ([place]) 04/02/2011  . Aortic aneurysm ([place]) 03/28/2017   Saw Dr Genevive Bi 04/16/17 - recommended f/u CT in 3 months.    . Atherosclerosis of both carotid arteries 07/17/2014  . Benign lipomatous neoplasm of skin and subcutaneous tissue of left arm 10/04/2013  . Bilateral carotid artery stenosis 08/30/2014   Overview:  Less than 50% 2015  . Bladder cancer ([place]) 03/05/2013  . CAD (coronary artery disease)  06/18/2014  . Cancer ([place]) 11-24-12   bladder. BCG treatments by Dr Jacqlyn Larsen.  . Chicken pox   . Chronic prostatitis 09/30/2012  . Colon polyp   . Congestive heart failure ([place]) 04/20/2014   Overview:  Overview:  With anterior mi and moderate lv dyfunction ef 35%  . Dizziness 12/22/2016  . 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 Elevated prostate specific antigen (PSA) 09/30/2012  . Essential hypertension, benign   . Gross hematuria 09/30/2012  . Herpes zoster 11/28/2011   Overview:  with ocular involvement OD  . Hypercholesterolemia   . Hypertension, benign 04/02/2011  . Hypotension   . Moderate mitral insufficiency 05/18/2015  . Moderate tricuspid insufficiency 05/18/2015  . Myasthenia gravis ([place])   . Myasthenia gravis without exacerbation ([place]) 03/31/2011  . Shingles 2013   Past Surgical History:  Procedure Laterality Date  . APPENDECTOMY  1958  . CHOLECYSTECTOMY  1979  . HERNIA REPAIR  1970  . TONSILLECTOMY  1958   Family History  Problem Relation Age of Onset  . Lung cancer Sister   . Prostate cancer Brother   . Lung cancer Brother   . Arthritis Other        parent  . Colon cancer Neg Hx    Social History   Socioeconomic History  . Marital status: Married  Spouse name: Not on file  . Number of children: Not on file  . Years of education: Not on file  . Highest education level: Not on file  Occupational History  . Not on file  Social Needs  . Financial resource strain: Not hard at all  . Food insecurity    Worry: Never true    Inability: Never true  . Transportation needs    Medical: No    Non-medical: No  Tobacco Use  . Smoking status: Former Smoker    Quit date: 09/29/1958    Years since quitting: 60.6  . Smokeless tobacco: Never Used  Substance and Sexual Activity  . Alcohol use: Yes    Alcohol/week: 0.0 standard drinks    Comment: occasionally  . Drug use: No  . Sexual activity: Never  Lifestyle  . Physical  activity    Days per week: Not on file    Minutes per session: Not on file  . Stress: Not at all  Relationships  . Social Herbalist on phone: Not on file    Gets together: Not on file    Attends religious service: Not on file    Active member of club or organization: Not on file    Attends meetings of clubs or organizations: Not on file    Relationship status: Not on file  Other Topics Concern  . Not on file  Social History Narrative   Pt is married with 2 children - 1 son, 1 daughter. Previously self-employed in [employer]    Outpatient Encounter Medications as of 05/10/2019  Medication Sig  . acetaminophen (TYLENOL) 500 MG tablet Take 500 mg by mouth every 6 (six) hours as needed.  Marland Kitchen aspirin 81 MG tablet Take 81 mg by mouth daily.  . finasteride (PROSCAR) 5 MG tablet Take 5 mg by mouth daily.  . furosemide (LASIX) 20 MG tablet Take 20 mg by mouth daily.  . hydroxypropyl methylcellulose / hypromellose (ISOPTO TEARS / GONIOVISC) 2.5 % ophthalmic solution Place 1 drop into both eyes daily.  . isosorbide mononitrate (IMDUR) 30 MG 24 hr tablet Take 30 mg by mouth daily.  Marland Kitchen lisinopril (PRINIVIL,ZESTRIL) 5 MG tablet Take 5 mg by mouth daily.  . mycophenolate (CELLCEPT) 250 MG capsule Take 1,000 mg by mouth 2 (two) times daily.   . pravastatin (PRAVACHOL) 20 MG tablet Take 1 tablet (20 mg total) by mouth daily.  . prednisoLONE acetate (PRED FORTE) 1 % ophthalmic suspension Place 1 drop into the right eye 4 (four) times daily.  Marland Kitchen pyridostigmine (MESTINON) 60 MG tablet Take 30 mg by mouth 6 (six) times daily.   . tamsulosin (FLOMAX) 0.4 MG CAPS Take 0.4 mg by mouth daily.   No facility-administered encounter medications on file as of 05/10/2019.     Activities of Daily Living In your present state of health, do you have any difficulty performing the following activities: 05/10/2019  Hearing? N  Vision? N  Difficulty concentrating or making decisions? N  Walking or  climbing stairs? N  Dressing or bathing? N  Doing errands, shopping? N  Preparing Food and eating ? N  Using the Toilet? N  In the past six months, have you accidently leaked urine? N  Do you have problems with loss of bowel control? N  Managing your Medications? N  Managing your Finances? N  Housekeeping or managing your Housekeeping? N  Some recent data might be hidden    Patient Care Team: [name],  MD as PCP - General (Internal Medicine) [name], MD (Internal Medicine) Bary Castilla Forest Gleason, MD (General Surgery)   Assessment:   This is a routine wellness examination for [place].  I connected with patient 05/10/19 at 10:00 AM EDT by an audio enabled telemedicine application and verified that I am speaking with the correct person using two identifiers. Patient stated full name and DOB. Patient gave permission to continue with virtual visit. Patient's location was at home and Nurse's location was at [place] office.   Health Screenings  Colonoscopy - 10/2011 Glaucoma -none Hearing -demonstrates normal hearing during visit. Hemoglobin A1C - 12/2018 (6.1) Cholesterol - 12/2018 Dental- visits every 6 months Vision- visits every 4 months  Social  Alcohol intake - yes, occ      Smoking history- former   Smokers in home? none Illicit drug use? none Exercise - gardening, yard work Diet - regular Sexually Active -never BMI- discussed the importance of a healthy diet, water intake and the benefits of aerobic exercise.  Educational material provided.   Safety  Patient feels safe at home- yes Patient does have smoke detectors at home- yes Patient does wear sunscreen or protective clothing when in direct sunlight -yes Patient does wear seat belt when in a moving vehicle -yes Patient drives- yes  [id number] precautions and sickness symptoms discussed. His son does the grocery shopping for the family.   Activities of Daily Living Patient denies needing assistance with:  driving, household chores, feeding themselves, getting from bed to chair, getting to the toilet, bathing/showering, dressing, managing money, or preparing meals.  No new identified risk were noted. He is the primary caretaker of the home.   Depression Screen Patient denies losing interest in daily life, feeling hopeless, or crying easily over simple problems.   Medication-taking as directed and without issues.   Fall Screen Patient denies being afraid of falling or falling in the last year.   Memory Screen Patient is alert.  Correctly identified the president of the [place], season and recall. Patient likes to read foe brain stimulation.   Immunizations The following Immunizations were discussed: Influenza, shingles, pneumonia, and tetanus.   Other Providers Patient Care Team: [name], MD as PCP - General (Internal Medicine) [name], MD (Internal Medicine) Bary Castilla Forest Gleason, MD (General Surgery)  Exercise Activities and Dietary recommendations Current Exercise Habits: Home exercise routine, Intensity: Mild  Goals    . Increase physical activity     Stay active doing yard work, cutting and hauling wood. Incorporate chair exercises as demonstrated, as tolerated.  Education provided.       Fall Risk Fall Risk  05/10/2019 08/09/2018 05/26/2018 09/09/2016 09/10/2015  Falls in the past year? 0 0 No No No  Is the patient's home free of loose throw rugs in walkways, pet beds, electrical cords, etc? yes      Grab bars in the bathroom? yes      Handrails on the stairs? yes         Adequate lighting? yes   Depression Screen PHQ 2/9 Scores 05/10/2019 09/09/2016 09/10/2015 09/10/2015  PHQ - 2 Score 0 0 0 0    Cognitive Function MMSE - Mini Mental State Exam 09/09/2016 09/10/2015  Orientation to time 5 5  Orientation to Place 5 5  Registration 3 3  Attention/ Calculation 5 5  Recall 3 3  Language- name 2 objects 2 2  Language- repeat 1 1  Language- follow 3 step  command 3 3  Language- read & follow  direction 1 1  Write a sentence 1 1  Copy design 1 1  Total score 30 30     6CIT Screen 05/10/2019  What Year? 0 points  What month? 0 points  What time? 0 points  Count back from 20 0 points  Months in reverse 0 points    Immunization History  Administered Date(s) Administered  . Influenza Split 06/29/2013  . Influenza, High Dose Seasonal PF 06/24/2016, 06/29/2017, 07/07/2018  . Influenza,inj,Quad PF,6+ Mos 07/26/2014  . Influenza-Unspecified 07/06/2015  . Pneumococcal Conjugate-13 10/02/2015  . Pneumococcal Polysaccharide-23 07/08/2017   Screening Tests Health Maintenance  Topic Date Due  . TETANUS/TDAP  09/05/1945  . INFLUENZA VACCINE  04/30/2019  . PNA vac Low Risk Adult  Completed       Plan:    End of life planning; Advance aging; Advanced directives discussed.  Copy of current HCPOA/Living Will requested.    I have personally reviewed and noted the following in the patient's chart:   . Medical and social history . Use of alcohol, tobacco or illicit drugs  . Current medications and supplements . Functional ability and status . Nutritional status . Physical activity . Advanced directives . List of other physicians . Hospitalizations, surgeries, and ER visits in previous 12 months . Vitals . Screenings to include cognitive, depression, and falls . Referrals and appointments  In addition, I have reviewed and discussed with patient certain preventive protocols, quality metrics, and best practice recommendations. A written personalized care plan for preventive services as well as general preventive health recommendations were provided to patient.     Varney Biles, LPN  1/[id number]/[id number]   Reviewed above information.  Agree with assessment and plan.    Dr Nicki Reaper

## 2019-05-26 DIAGNOSIS — I2510 Atherosclerotic heart disease of native coronary artery without angina pectoris: Secondary | ICD-10-CM | POA: Diagnosis not present

## 2019-05-26 DIAGNOSIS — I5022 Chronic systolic (congestive) heart failure: Secondary | ICD-10-CM | POA: Diagnosis not present

## 2019-05-26 DIAGNOSIS — I10 Essential (primary) hypertension: Secondary | ICD-10-CM | POA: Diagnosis not present

## 2019-05-26 DIAGNOSIS — I119 Hypertensive heart disease without heart failure: Secondary | ICD-10-CM | POA: Insufficient documentation

## 2019-05-26 DIAGNOSIS — I350 Nonrheumatic aortic (valve) stenosis: Secondary | ICD-10-CM | POA: Diagnosis not present

## 2019-06-23 DIAGNOSIS — B028 Zoster with other complications: Secondary | ICD-10-CM | POA: Diagnosis not present

## 2019-06-28 ENCOUNTER — Telehealth: Payer: Self-pay

## 2019-06-28 NOTE — Telephone Encounter (Signed)
Called pt.

## 2019-06-28 NOTE — Telephone Encounter (Signed)
Copied from [place] [id number]. Topic: General - Other >> Jun 28, 2019 11:44 AM Antonieta Iba C wrote: Reason for CRM: pt is returning Ashley's call for pre-screening for apt on 10/1, please assist pt.

## 2019-06-29 ENCOUNTER — Other Ambulatory Visit: Payer: Self-pay

## 2019-06-30 ENCOUNTER — Ambulatory Visit (INDEPENDENT_AMBULATORY_CARE_PROVIDER_SITE_OTHER): Payer: Medicare Other | Admitting: Internal Medicine

## 2019-06-30 ENCOUNTER — Other Ambulatory Visit: Payer: Self-pay

## 2019-06-30 VITALS — BP 126/70 | HR 61 | Temp 97.2°F | Resp 16 | Ht 70.0 in | Wt 202.0 lb

## 2019-06-30 DIAGNOSIS — I10 Essential (primary) hypertension: Secondary | ICD-10-CM | POA: Diagnosis not present

## 2019-06-30 DIAGNOSIS — Z8551 Personal history of malignant neoplasm of bladder: Secondary | ICD-10-CM

## 2019-06-30 DIAGNOSIS — Z23 Encounter for immunization: Secondary | ICD-10-CM

## 2019-06-30 DIAGNOSIS — R300 Dysuria: Secondary | ICD-10-CM | POA: Diagnosis not present

## 2019-06-30 DIAGNOSIS — R739 Hyperglycemia, unspecified: Secondary | ICD-10-CM | POA: Diagnosis not present

## 2019-06-30 DIAGNOSIS — D649 Anemia, unspecified: Secondary | ICD-10-CM | POA: Diagnosis not present

## 2019-06-30 DIAGNOSIS — E7800 Pure hypercholesterolemia, unspecified: Secondary | ICD-10-CM | POA: Diagnosis not present

## 2019-06-30 DIAGNOSIS — I712 Thoracic aortic aneurysm, without rupture, unspecified: Secondary | ICD-10-CM

## 2019-06-30 DIAGNOSIS — G7080 Lambert-Eaton syndrome, unspecified: Secondary | ICD-10-CM

## 2019-06-30 DIAGNOSIS — I509 Heart failure, unspecified: Secondary | ICD-10-CM

## 2019-06-30 DIAGNOSIS — R9389 Abnormal findings on diagnostic imaging of other specified body structures: Secondary | ICD-10-CM

## 2019-06-30 DIAGNOSIS — G7000 Myasthenia gravis without (acute) exacerbation: Secondary | ICD-10-CM

## 2019-06-30 DIAGNOSIS — Z0000 Encounter for general adult medical examination without abnormal findings: Secondary | ICD-10-CM

## 2019-06-30 DIAGNOSIS — R600 Localized edema: Secondary | ICD-10-CM

## 2019-06-30 DIAGNOSIS — I2510 Atherosclerotic heart disease of native coronary artery without angina pectoris: Secondary | ICD-10-CM

## 2019-06-30 LAB — CBC WITH DIFFERENTIAL/PLATELET
Basophils Absolute: 0.1 10*3/uL (ref 0.0–0.1)
Basophils Relative: 0.9 % (ref 0.0–3.0)
Eosinophils Absolute: 0.1 10*3/uL (ref 0.0–0.7)
Eosinophils Relative: 1.8 % (ref 0.0–5.0)
HCT: 38.4 % — ABNORMAL LOW (ref 39.0–52.0)
Hemoglobin: 12.3 g/dL — ABNORMAL LOW (ref 13.0–17.0)
Lymphocytes Relative: 17.6 % (ref 12.0–46.0)
Lymphs Abs: 1 10*3/uL (ref 0.7–4.0)
MCHC: 32.1 g/dL (ref 30.0–36.0)
MCV: 92.7 fl (ref 78.0–100.0)
Monocytes Absolute: 0.6 10*3/uL (ref 0.1–1.0)
Monocytes Relative: 10.2 % (ref 3.0–12.0)
Neutro Abs: 3.9 10*3/uL (ref 1.4–7.7)
Neutrophils Relative %: 69.5 % (ref 43.0–77.0)
Platelets: 163 10*3/uL (ref 150.0–400.0)
RBC: 4.14 Mil/uL — ABNORMAL LOW (ref 4.22–5.81)
RDW: 14.7 % (ref 11.5–15.5)
WBC: 5.6 10*3/uL (ref 4.0–10.5)

## 2019-06-30 LAB — URINALYSIS, ROUTINE W REFLEX MICROSCOPIC
Bilirubin Urine: NEGATIVE
Ketones, ur: NEGATIVE
Nitrite: NEGATIVE
RBC / HPF: NONE SEEN (ref 0–?)
Specific Gravity, Urine: 1.02 (ref 1.000–1.030)
Total Protein, Urine: NEGATIVE
Urine Glucose: NEGATIVE
Urobilinogen, UA: 0.2 (ref 0.0–1.0)
pH: 5.5 (ref 5.0–8.0)

## 2019-06-30 LAB — LIPID PANEL
Cholesterol: 143 mg/dL (ref 0–200)
HDL: 50.9 mg/dL (ref >39.00)
LDL Cholesterol: 71 mg/dL (ref 0–99)
NonHDL: 91.83
Total CHOL/HDL Ratio: 3
Triglycerides: 102 mg/dL (ref 0.0–149.0)
VLDL: 20.4 mg/dL (ref 0.0–40.0)

## 2019-06-30 LAB — BASIC METABOLIC PANEL
BUN: 20 mg/dL (ref 6–23)
CO2: 32 mEq/L (ref 19–32)
Calcium: 9.5 mg/dL (ref 8.4–10.5)
Chloride: 103 mEq/L (ref 96–112)
Creatinine, Ser: 0.95 mg/dL (ref 0.40–1.50)
GFR: 74.34 mL/min (ref >60.00)
Glucose, Bld: 105 mg/dL — ABNORMAL HIGH (ref 70–99)
Potassium: 4.8 mEq/L (ref 3.5–5.1)
Sodium: 142 mEq/L (ref 135–145)

## 2019-06-30 LAB — HEPATIC FUNCTION PANEL
ALT: 9 U/L (ref 0–53)
AST: 12 U/L (ref 0–37)
Albumin: 4.1 g/dL (ref 3.5–5.2)
Alkaline Phosphatase: 63 U/L (ref 39–117)
Bilirubin, Direct: 0.1 mg/dL (ref 0.0–0.3)
Total Bilirubin: 0.8 mg/dL (ref 0.2–1.2)
Total Protein: 6.2 g/dL (ref 6.0–8.3)

## 2019-06-30 LAB — TSH: TSH: 2.19 u[IU]/mL (ref 0.35–4.50)

## 2019-06-30 LAB — FERRITIN: Ferritin: 92 ng/mL (ref 22.0–322.0)

## 2019-06-30 LAB — HEMOGLOBIN A1C: Hgb A1c MFr Bld: 6.2 % (ref 4.6–6.5)

## 2019-06-30 MED ORDER — ALIGN 4 MG PO CAPS: ORAL_CAPSULE | ORAL | 0 refills | Status: DC

## 2019-06-30 MED ORDER — FUROSEMIDE 20 MG PO TABS: 20.0000 mg | ORAL_TABLET | Freq: Every day | ORAL | 1 refills | Status: DC

## 2019-06-30 MED ORDER — DOXYCYCLINE HYCLATE 100 MG PO TABS: 100.0000 mg | ORAL_TABLET | Freq: Two times a day (BID) | ORAL | 0 refills | Status: DC

## 2019-06-30 NOTE — Progress Notes (Signed)
Patient ID: [name], male   DOB: Dec 31, 1925, 85 y.o.   MRN: [id number]   Subjective:    Patient ID: [name], male    DOB: 06/20/1926, 85 y.o.   MRN: [id number]  HPI  Patient here for a scheduled physical exam.  He has noticed increased lower extremity swelling.  Worsened recently.  Has seen vascular surgery previously. Not wearing compression hose.  States recently ate differently and may have had more salt.  Some redness.  Some stasis changes.  No chest pain.  Breathing stable.  No acid reflux.  No abdominal pain.  Bowels moving.  Does report some burning with urination.  No hesitancy.  No fever.  Does not feel as well, but no acute symptoms.  No vomiting.  Eating.     Past Medical History:  Diagnosis Date   Abnormal chest CT 03/28/2017   Allergy    Anemia    Angina pectoris ([place]) 04/02/2011   Aortic aneurysm ([place]) 03/28/2017   Saw Dr Genevive Bi 04/16/17 - recommended f/u CT in 3 months.     Atherosclerosis of both carotid arteries 07/17/2014   Benign lipomatous neoplasm of skin and subcutaneous tissue of left arm 10/04/2013   Bilateral carotid artery stenosis 08/30/2014   Overview:  Less than 50% 2015   Bladder cancer ([place]) 03/05/2013   CAD (coronary artery disease) 06/18/2014   Cancer ([place]) 11-24-12   bladder. BCG treatments by Dr Jacqlyn Larsen.   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   Eaton-Lambert syndrome ([facility])    Elevated prostate specific antigen (PSA) 09/30/2012   Essential hypertension, benign    Gross hematuria 09/30/2012   Herpes zoster 11/28/2011   Overview:  with ocular involvement OD   Hypercholesterolemia    Hypertension, benign 04/02/2011   Hypotension    Moderate mitral insufficiency 05/18/2015   Moderate tricuspid insufficiency 05/18/2015     Myasthenia gravis ([place])    Myasthenia gravis without exacerbation ([place]) 03/31/2011   Shingles 2013   Past Surgical History:  Procedure Laterality Date   [place]   Family History  Problem Relation Age of Onset   Lung cancer Sister    Prostate cancer Brother    Lung cancer Brother    Arthritis Other        parent   Colon cancer Neg Hx    Social History   Socioeconomic History   Marital status: Married    Spouse name: Not on file   Number of children: Not on file   Years of education: Not on file   Highest education level: Not on file  Occupational History   Not on file  Social Needs   Financial resource strain: Not hard at all   Food insecurity    Worry: Never true    Inability: Never true   Transportation needs    Medical: No    Non-medical: No  Tobacco Use   Smoking status: Former Smoker    Quit date: 09/29/1958    Years since quitting: 60.8   Smokeless tobacco: Never Used  Substance and Sexual Activity   Alcohol use: Yes    Alcohol/week: 0.0 standard drinks    Comment:  occasionally   Drug use: No   Sexual activity: Never  Lifestyle   Physical activity    Days per week: Not on file    Minutes per session: Not on file   Stress: Not at all  Relationships   Social connections    Talks on phone: Not on file    Gets together: Not on file    Attends religious service: Not on file    Active member of club or organization: Not on file    Attends meetings of clubs or organizations: Not on file    Relationship status: Not on file  Other Topics Concern   Not on file  Social History Narrative   Pt is married with 2 children - 1 son, 1 daughter. Previously self-employed in [employer]    Outpatient Encounter Medications as of 06/30/2019  Medication Sig   acetaminophen (TYLENOL) 500 MG tablet Take 500 mg by mouth every 6 (six) hours as needed.    aspirin 81 MG tablet Take 81 mg by mouth daily.   doxycycline (VIBRA-TABS) 100 MG tablet Take 1 tablet (100 mg total) by mouth 2 (two) times daily.   finasteride (PROSCAR) 5 MG tablet Take 5 mg by mouth daily.   furosemide (LASIX) 20 MG tablet Take 1 tablet (20 mg total) by mouth daily.   hydroxypropyl methylcellulose / hypromellose (ISOPTO TEARS / GONIOVISC) 2.5 % ophthalmic solution Place 1 drop into both eyes daily.   isosorbide mononitrate (IMDUR) 30 MG 24 hr tablet Take 30 mg by mouth daily.   lisinopril (PRINIVIL,ZESTRIL) 5 MG tablet Take 5 mg by mouth daily.   mycophenolate (CELLCEPT) 250 MG capsule Take 1,000 mg by mouth 2 (two) times daily.    pravastatin (PRAVACHOL) 20 MG tablet Take 1 tablet (20 mg total) by mouth daily.   prednisoLONE acetate (PRED FORTE) 1 % ophthalmic suspension Place 1 drop into the right eye 4 (four) times daily.   Probiotic Product (ALIGN) 4 MG CAPS One capsule q day   pyridostigmine (MESTINON) 60 MG tablet Take 30 mg by mouth 6 (six) times daily.    tamsulosin (FLOMAX) 0.4 MG CAPS Take 0.4 mg by mouth daily.   [DISCONTINUED] furosemide (LASIX) 20 MG tablet Take 20 mg by mouth daily.   No facility-administered encounter medications on file as of 06/30/2019.     Review of Systems  Constitutional: Negative for appetite change and fever.  HENT: Negative for congestion and sinus pressure.   Eyes: Negative for pain and visual disturbance.  Respiratory: Negative for cough, chest tightness and shortness of breath.   Cardiovascular: Positive for leg swelling. Negative for chest pain and palpitations.  Gastrointestinal: Negative for abdominal pain, diarrhea, nausea and vomiting.  Genitourinary: Positive for dysuria. Negative for difficulty urinating.  Musculoskeletal: Negative for joint swelling and myalgias.  Skin: Negative for rash and wound.  Neurological: Negative for dizziness, light-headedness and headaches.  Hematological: Negative for  adenopathy. Does not bruise/bleed easily.  Psychiatric/Behavioral: Negative for agitation and dysphoric mood.       Objective:    Physical Exam Constitutional:      General: He is not in acute distress.    Appearance: Normal appearance. He is well-developed.  HENT:     Head: Normocephalic and atraumatic.     Right Ear: External ear normal.     Left Ear: External ear normal.     Mouth/Throat:     Pharynx: No oropharyngeal exudate.  Eyes:     General: No scleral icterus.  Right eye: No discharge.        Left eye: No discharge.     Conjunctiva/sclera: Conjunctivae normal.  Neck:     Musculoskeletal: Neck supple. No muscular tenderness.     Thyroid: No thyromegaly.  Cardiovascular:     Rate and Rhythm: Normal rate and regular rhythm.  Pulmonary:     Effort: No respiratory distress.     Breath sounds: Normal breath sounds. No wheezing.  Abdominal:     General: Bowel sounds are normal.     Palpations: Abdomen is soft.     Tenderness: There is no abdominal tenderness.  Genitourinary:    Comments: Not performed.  Musculoskeletal:        General: No tenderness.  Lymphadenopathy:     Cervical: No cervical adenopathy.  Skin:    Findings: No rash.     Comments: Stasis changes and erythema - lower extremities.    Neurological:     Mental Status: He is alert and oriented to person, place, and time.  Psychiatric:        Mood and Affect: Mood normal.        Behavior: Behavior normal.     BP 126/70    Pulse 61    Temp (!) 97.2 F (36.2 C)    Resp 16    Ht '5\' 10"'$  (1.778 m)    Wt 202 lb (91.6 kg)    SpO2 97%    BMI 28.98 kg/m  Wt Readings from Last 3 Encounters:  06/30/19 202 lb (91.6 kg)  10/12/18 199 lb 9.6 oz (90.5 kg)  10/07/18 203 lb (92.1 kg)     Lab Results  Component Value Date   WBC 5.6 06/30/2019   HGB 12.3 (L) 06/30/2019   HCT 38.4 (L) 06/30/2019   PLT 163.0 06/30/2019   GLUCOSE 105 (H) 06/30/2019   CHOL 143 06/30/2019   TRIG 102.0 06/30/2019   HDL  50.90 06/30/2019   LDLDIRECT 143.5 08/26/2013   LDLCALC 71 06/30/2019   ALT 9 06/30/2019   AST 12 06/30/2019   NA 142 06/30/2019   K 4.8 06/30/2019   CL 103 06/30/2019   CREATININE 0.95 06/30/2019   BUN 20 06/30/2019   CO2 32 06/30/2019   TSH 2.19 06/30/2019   HGBA1C 6.2 06/30/2019    Ct Chest Wo Contrast  Result Date: 08/02/2018 CLINICAL DATA:  85 year old male former smoker (quit 40 years ago). History of bladder cancer. Follow-up evaluation for lung nodule. EXAM: CT CHEST WITHOUT CONTRAST TECHNIQUE: Multidetector CT imaging of the chest was performed following the standard protocol without IV contrast. COMPARISON:  Chest CT 07/30/2017. FINDINGS: Cardiovascular: Heart size is normal. There is no significant pericardial fluid, thickening or pericardial calcification. There is aortic atherosclerosis, as well as atherosclerosis of the great vessels of the mediastinum and the coronary arteries, including calcified atherosclerotic plaque in the left main, left anterior descending, left circumflex and right coronary arteries. Calcifications of the aortic valve. Ectasia of the isthmus of the thoracic aorta which measures up to 4.2 cm in diameter. Mediastinum/Nodes: No pathologically enlarged mediastinal or hilar lymph nodes. Please note that accurate exclusion of hilar adenopathy is limited on noncontrast CT scans. Esophagus is unremarkable in appearance. No axillary lymphadenopathy. Lungs/Pleura: Previously noted 4 mm ground-glass attenuation nodule in the left upper lobe has resolved, indicative of a benign lesion. There are other 2-4 mm pulmonary nodules in lungs bilaterally, stable compared to the prior study in retrospect, with the largest of these measuring 4 mm in  the right lower lobe. No other larger more suspicious appearing pulmonary nodules or masses are noted. Scattered areas of mild cylindrical bronchiectasis, most evident in the left lower lobe and right upper lobe. No acute consolidative  airspace disease. No pleural effusions. Upper Abdomen: Incompletely imaged exophytic high attenuation lesion measuring at least 1 cm in the upper pole of the left kidney anteriorly is not characterized on today's noncontrast CT examination, but is similar to prior studies, presumably a small proteinaceous/hemorrhagic cyst. Aortic atherosclerosis. Musculoskeletal: There are no aggressive appearing lytic or blastic lesions noted in the visualized portions of the skeleton. IMPRESSION: 1. Tiny pulmonary nodules have either resolved or are stable compared to the prior study, as above, measuring 4 mm or less in size, considered definitively benign requiring no future imaging follow-up. 2. Scattered areas of mild cylindrical bronchiectasis again noted, likely secondary to prior infections. 3. Aortic atherosclerosis, in addition to left main and 3 vessel coronary artery disease. 4. Ectasia of the isthmus of the thoracic aorta measuring up to 4.2 cm in diameter, unchanged compared to prior studies. 5. There are calcifications of the aortic valve. Echocardiographic correlation for evaluation of potential valvular dysfunction may be warranted if clinically indicated. Aortic Atherosclerosis (ICD10-I70.0). Electronically Signed   By: Vinnie Langton M.D.   On: 08/02/2018 12:47       Assessment & Plan:   Problem List Items Addressed This Visit    Abnormal chest CT    Saw Dr Genevive Bi 08/09/18 for f/u pulmonary nodules.  Stable or regressed.  No further f/u warranted.  Will get Dr Genevive Bi opinion regarding f/u aorta.        Anemia - Primary    Follow cbc.       Relevant Orders   CBC with Differential/Platelet (Completed)   Ferritin (Completed)   Aortic aneurysm [facility])    Evaluated by Dr Genevive Bi 07/2018 - f/u chest CT.  D/w with him regarding any further f/u.        Relevant Medications   furosemide (LASIX) 20 MG tablet   Benign essential hypertension    Blood pressure under good control.  Continue same medication  regimen.  Follow pressures.  Follow metabolic panel.        Relevant Medications   furosemide (LASIX) 20 MG tablet   Bilateral lower extremity edema    Has seen [facility].  Swelling worsened recently.  Called pt at home to follow up.  He informed me he had not been taking lasix.  Restart.  Kidney function ok.  Call with update.  May need referral back to [facility].  Avoid increased sodium intake. Elevate legs.       CAD (coronary artery disease)    Seeing cardiology.  Stable.       Relevant Medications   furosemide (LASIX) 20 MG tablet   CHF (congestive heart failure) (HCC)    Lower extremity swelling.  Breathing stable.  Restart lasix.        Relevant Medications   furosemide (LASIX) 20 MG tablet   Dysuria    Burning with urination.  No hesitancy.  Feels is emptying bladder.  Unable to use floroquinalone given history of myasthenia.  Also avoid sulfa. (pt also allergic to sulfa).  Discussed various treatment options.  Doxycycline as directed.  Follow.  Send urine for culture.  May need referral to urology.       Relevant Orders   Urinalysis, Routine w reflex microscopic (Completed)   Urine Culture (Completed)   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  [place])    Followed by neurology.  Stable.  Continue current medication regimen.       Health care maintenance    Physical today 06/29/19.  Has been followed by urology.  Colonoscopy 2013.        History of bladder cancer    Followed by urology.        Hypercholesterolemia    On pravastatin.  Low cholesterol diet and exercise.  Follow lipid panel and liver function tests.        Relevant Medications   furosemide (LASIX) 20 MG tablet   Other Relevant Orders   Hepatic function panel (Completed)   Lipid panel (Completed)   Hyperglycemia    Low carb diet and exercise.  Follow met b and a1c.       Relevant Orders   Hemoglobin A1c (Completed)   Hypertension, benign    Blood pressure under good control.  Continue same medication regimen.   Follow pressures.  Follow metabolic panel.        Relevant Medications   furosemide (LASIX) 20 MG tablet   Other Relevant Orders   TSH (Completed)   Basic metabolic panel (Completed)   Myasthenia gravis without exacerbation ([place])    Has been followed by neurology.  Limited in abx choice given history of MG.         Other Visit Diagnoses    Need for immunization against influenza       Relevant Orders   Flu Vaccine QUAD High Dose(Fluad) (Completed)       [name], MD

## 2019-07-01 ENCOUNTER — Other Ambulatory Visit: Payer: Self-pay | Admitting: Internal Medicine

## 2019-07-01 DIAGNOSIS — D649 Anemia, unspecified: Secondary | ICD-10-CM

## 2019-07-01 DIAGNOSIS — R600 Localized edema: Secondary | ICD-10-CM

## 2019-07-01 NOTE — Progress Notes (Signed)
Order placed for f/u labs.  

## 2019-07-02 LAB — URINE CULTURE
MICRO NUMBER:: 943825
SPECIMEN QUALITY:: ADEQUATE

## 2019-07-03 ENCOUNTER — Encounter: Payer: Self-pay | Admitting: Internal Medicine

## 2019-07-03 NOTE — Assessment & Plan Note (Signed)
Follow cbc.  

## 2019-07-03 NOTE — Assessment & Plan Note (Signed)
Saw Dr Genevive Bi 08/09/18 for f/u pulmonary nodules.  Stable or regressed.  No further f/u warranted.  Will get Dr Genevive Bi opinion regarding f/u aorta.

## 2019-07-03 NOTE — Assessment & Plan Note (Signed)
Followed by urology.   

## 2019-07-03 NOTE — Assessment & Plan Note (Signed)
Evaluated by Dr Genevive Bi 07/2018 - f/u chest CT.  D/w with him regarding any further f/u.

## 2019-07-03 NOTE — Assessment & Plan Note (Signed)
Blood pressure under good control.  Continue same medication regimen.  Follow pressures.  Follow metabolic panel.   

## 2019-07-03 NOTE — Assessment & Plan Note (Signed)
Physical today 06/29/19.  Has been followed by urology.  Colonoscopy 2013.

## 2019-07-03 NOTE — Assessment & Plan Note (Signed)
Has seen [facility].  Swelling worsened recently.  Called pt at home to follow up.  He informed me he had not been taking lasix.  Restart.  Kidney function ok.  Call with update.  May need referral back to [facility].  Avoid increased sodium intake. Elevate legs.

## 2019-07-03 NOTE — Assessment & Plan Note (Signed)
Low carb diet and exercise.  Follow met b and a1c.  

## 2019-07-03 NOTE — Assessment & Plan Note (Signed)
Burning with urination.  No hesitancy.  Feels is emptying bladder.  Unable to use floroquinalone given history of myasthenia.  Also avoid sulfa. (pt also allergic to sulfa).  Discussed various treatment options.  Doxycycline as directed.  Follow.  Send urine for culture.  May need referral to urology.

## 2019-07-03 NOTE — Assessment & Plan Note (Signed)
Has been followed by neurology.  Limited in abx choice given history of MG.

## 2019-07-03 NOTE — Assessment & Plan Note (Signed)
On pravastatin.  Low cholesterol diet and exercise.  Follow lipid panel and liver function tests.   

## 2019-07-03 NOTE — Assessment & Plan Note (Signed)
Lower extremity swelling.  Breathing stable.  Restart lasix.

## 2019-07-03 NOTE — Assessment & Plan Note (Signed)
Followed by neurology.  Stable.  Continue current medication regimen.

## 2019-07-03 NOTE — Assessment & Plan Note (Signed)
Seeing cardiology.  Stable.

## 2019-07-19 ENCOUNTER — Other Ambulatory Visit (INDEPENDENT_AMBULATORY_CARE_PROVIDER_SITE_OTHER): Payer: Medicare Other

## 2019-07-19 ENCOUNTER — Other Ambulatory Visit: Payer: Self-pay

## 2019-07-19 DIAGNOSIS — R600 Localized edema: Secondary | ICD-10-CM

## 2019-07-19 DIAGNOSIS — D649 Anemia, unspecified: Secondary | ICD-10-CM

## 2019-07-19 LAB — BASIC METABOLIC PANEL
BUN: 19 mg/dL (ref 6–23)
CO2: 30 mEq/L (ref 19–32)
Calcium: 8.9 mg/dL (ref 8.4–10.5)
Chloride: 103 mEq/L (ref 96–112)
Creatinine, Ser: 1.06 mg/dL (ref 0.40–1.50)
GFR: 65.51 mL/min (ref >60.00)
Glucose, Bld: 107 mg/dL — ABNORMAL HIGH (ref 70–99)
Potassium: 4.5 mEq/L (ref 3.5–5.1)
Sodium: 141 mEq/L (ref 135–145)

## 2019-07-19 LAB — VITAMIN B12: Vitamin B-12: 321 pg/mL (ref 211–911)

## 2019-08-10 ENCOUNTER — Telehealth: Payer: Self-pay | Admitting: *Deleted

## 2019-08-10 NOTE — Telephone Encounter (Signed)
Copied from [place] [id number]. Topic: General - Other >> Aug 10, 2019 12:27 PM Berneta Levins wrote: Reason for CRM:   Pt's daughter, [name], calling.  States that she needs PCP to know that pt's wife has passed away (August 19, 2019).  Main concern for pt's daughter is swelling that she is seeing in legs.  Pt's daughter would like for pt to be really encouraged to take care of himself during this time. [name] can be reached at [phone]

## 2019-08-10 NOTE — Telephone Encounter (Signed)
Patients wife just passed away. Has appt on 12-Sep-2023.

## 2019-08-15 ENCOUNTER — Ambulatory Visit (INDEPENDENT_AMBULATORY_CARE_PROVIDER_SITE_OTHER): Payer: Medicare Other

## 2019-08-15 ENCOUNTER — Ambulatory Visit (INDEPENDENT_AMBULATORY_CARE_PROVIDER_SITE_OTHER): Payer: Medicare Other | Admitting: Internal Medicine

## 2019-08-15 ENCOUNTER — Other Ambulatory Visit: Payer: Self-pay

## 2019-08-15 DIAGNOSIS — Z8679 Personal history of other diseases of the circulatory system: Secondary | ICD-10-CM

## 2019-08-15 DIAGNOSIS — I509 Heart failure, unspecified: Secondary | ICD-10-CM

## 2019-08-15 DIAGNOSIS — I712 Thoracic aortic aneurysm, without rupture, unspecified: Secondary | ICD-10-CM

## 2019-08-15 DIAGNOSIS — I2510 Atherosclerotic heart disease of native coronary artery without angina pectoris: Secondary | ICD-10-CM

## 2019-08-15 DIAGNOSIS — R9389 Abnormal findings on diagnostic imaging of other specified body structures: Secondary | ICD-10-CM

## 2019-08-15 DIAGNOSIS — R600 Localized edema: Secondary | ICD-10-CM

## 2019-08-15 DIAGNOSIS — G7080 Lambert-Eaton syndrome, unspecified: Secondary | ICD-10-CM

## 2019-08-15 DIAGNOSIS — Z8551 Personal history of malignant neoplasm of bladder: Secondary | ICD-10-CM

## 2019-08-15 DIAGNOSIS — E7800 Pure hypercholesterolemia, unspecified: Secondary | ICD-10-CM

## 2019-08-15 DIAGNOSIS — I10 Essential (primary) hypertension: Secondary | ICD-10-CM

## 2019-08-15 DIAGNOSIS — R739 Hyperglycemia, unspecified: Secondary | ICD-10-CM

## 2019-08-15 DIAGNOSIS — G7000 Myasthenia gravis without (acute) exacerbation: Secondary | ICD-10-CM

## 2019-08-15 DIAGNOSIS — J9811 Atelectasis: Secondary | ICD-10-CM | POA: Diagnosis not present

## 2019-08-15 DIAGNOSIS — D649 Anemia, unspecified: Secondary | ICD-10-CM

## 2019-08-15 NOTE — Progress Notes (Signed)
Patient ID: [name], male   DOB: 1925/11/13, 85 y.o.   MRN: [id number]   Subjective:    Patient ID: [name], male    DOB: [birth year], 85 y.o.   MRN: [id number]  HPI   This visit occurred during the SARS-CoV-2 public health emergency.  Safety protocols were in place, including screening questions prior to the visit, additional usage of staff PPE, and extensive cleaning of exam room while observing appropriate contact time as indicated for disinfecting solutions.  Patient here for a scheduled follow up.  He is accompanied by his daughter.  History obtained from both them.  Increased stress.  His wife just recently passed.  Discussed with him today.  Has good support.  Does not feel needs any further intervention at this time.  He is sleeping.  Eating.  No nausea or vomiting.  No increased heart rate or palpitations.  No chest pain.  He feels his breathing is stable.  No abdominal pain.  Bowels stable.  Was started on lasix last visit for increased lower extremity swelling.  Swelling some better.  Still with lower extremity swelling.  Has seen vascular surgery.  Discussed f/u.  He is in agreement.     Past Medical History:  Diagnosis Date   Abnormal chest CT 03/28/2017   Allergy    Anemia    Angina pectoris ([place]) 04/02/2011   Aortic aneurysm ([place]) 03/28/2017   Saw Dr Genevive Bi 04/16/17 - recommended f/u CT in 3 months.     Atherosclerosis of both carotid arteries 07/17/2014   Benign lipomatous neoplasm of skin and subcutaneous tissue of left arm 10/04/2013   Bilateral carotid artery stenosis 08/30/2014   Overview:  Less than 50% 2015   Bladder cancer ([place]) 03/05/2013   CAD (coronary artery disease) 06/18/2014   Cancer ([place]) 11-24-12   bladder. BCG treatments by Dr Jacqlyn Larsen.   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   Eaton-Lambert syndrome (HCC)    Elevated prostate specific antigen (PSA) 09/30/2012   Essential hypertension, benign    Gross hematuria 09/30/2012   Herpes zoster 11/28/2011   Overview:  with ocular involvement OD   Hypercholesterolemia    Hypertension, benign 04/02/2011   Hypotension    Moderate mitral insufficiency 05/18/2015   Moderate tricuspid insufficiency 05/18/2015   Myasthenia gravis ([place])    Myasthenia gravis without exacerbation ([place]) 03/31/2011   Shingles 2013   Past Surgical History:  Procedure Laterality Date   [place]   Family History  Problem Relation Age of Onset   Lung cancer Sister    Prostate cancer Brother    Lung cancer Brother    Arthritis Other        parent   Colon cancer Neg Hx    Social History   Socioeconomic History   Marital status: Married    Spouse name: Not on file   Number of children: Not on file   Years of education: Not on file   Highest education level: Not on file  Occupational History   Not on file  Social Needs   Financial resource strain: Not hard at all   Food insecurity  Worry: Never true    Inability: Never true   Transportation needs    Medical: No    Non-medical: No  Tobacco Use   Smoking status: Former Smoker    Quit date: 09/29/1958    Years since quitting: 60.9   Smokeless tobacco: Never Used  Substance and Sexual Activity   Alcohol use: Yes    Alcohol/week: 0.0 standard drinks    Comment: occasionally   Drug use: No   Sexual activity: Never  Lifestyle   Physical activity    Days per week: Not on file    Minutes per session: Not on file   Stress: Not at all  Relationships   Social connections    Talks on phone: Not on file    Gets together: Not on file    Attends religious service: Not on file    Active member of club or organization: Not on  file    Attends meetings of clubs or organizations: Not on file    Relationship status: Not on file  Other Topics Concern   Not on file  Social History Narrative   Pt is married with 2 children - 1 son, 1 daughter. Previously self-employed in [employer]    Outpatient Encounter Medications as of 08/15/2019  Medication Sig   acetaminophen (TYLENOL) 500 MG tablet Take 500 mg by mouth every 6 (six) hours as needed.   aspirin 81 MG tablet Take 81 mg by mouth daily.   finasteride (PROSCAR) 5 MG tablet Take 5 mg by mouth daily.   furosemide (LASIX) 20 MG tablet Take 1 tablet (20 mg total) by mouth daily.   hydroxypropyl methylcellulose / hypromellose (ISOPTO TEARS / GONIOVISC) 2.5 % ophthalmic solution Place 1 drop into both eyes daily.   isosorbide mononitrate (IMDUR) 30 MG 24 hr tablet Take 30 mg by mouth daily.   lisinopril (PRINIVIL,ZESTRIL) 5 MG tablet Take 5 mg by mouth daily.   mycophenolate (CELLCEPT) 250 MG capsule Take 1,000 mg by mouth 2 (two) times daily.    pravastatin (PRAVACHOL) 20 MG tablet Take 1 tablet (20 mg total) by mouth daily.   prednisoLONE acetate (PRED FORTE) 1 % ophthalmic suspension Place 1 drop into the right eye 4 (four) times daily.   Probiotic Product (ALIGN) 4 MG CAPS One capsule q day   pyridostigmine (MESTINON) 60 MG tablet Take 30 mg by mouth 6 (six) times daily.    tamsulosin (FLOMAX) 0.4 MG CAPS Take 0.4 mg by mouth daily.   [DISCONTINUED] doxycycline (VIBRA-TABS) 100 MG tablet Take 1 tablet (100 mg total) by mouth 2 (two) times daily.   No facility-administered encounter medications on file as of 08/15/2019.    Review of Systems  Constitutional: Negative for appetite change.       Some weight gain.  He reports is from eating more.   HENT: Negative for congestion and sinus pressure.   Respiratory: Negative for cough and chest tightness.        Breathing stable.    Cardiovascular: Positive for leg swelling. Negative for chest pain  and palpitations.  Gastrointestinal: Negative for abdominal pain, diarrhea, nausea and vomiting.  Genitourinary: Negative for difficulty urinating and dysuria.  Musculoskeletal: Negative for joint swelling and myalgias.  Skin: Negative for color change and rash.  Neurological: Negative for dizziness, light-headedness and headaches.  Psychiatric/Behavioral: Negative for agitation and dysphoric mood.       Objective:    Physical Exam Constitutional:      General: He is not  in acute distress.    Appearance: Normal appearance. He is well-developed.  HENT:     Head: Normocephalic and atraumatic.     Right Ear: External ear normal.     Left Ear: External ear normal.  Eyes:     General: No scleral icterus.       Right eye: No discharge.        Left eye: No discharge.     Conjunctiva/sclera: Conjunctivae normal.  Neck:     Musculoskeletal: Neck supple. No muscular tenderness.  Cardiovascular:     Rate and Rhythm: Normal rate and regular rhythm.  Pulmonary:     Effort: Pulmonary effort is normal. No respiratory distress.  Abdominal:     General: Bowel sounds are normal.     Palpations: Abdomen is soft.     Tenderness: There is no abdominal tenderness.  Musculoskeletal:     Comments: Pedal and lower extremity swelling.  No increased erythema.  Some stasis changes present.   Lymphadenopathy:     Cervical: No cervical adenopathy.  Skin:    Findings: No erythema or rash.  Neurological:     Mental Status: He is alert.  Psychiatric:        Mood and Affect: Mood normal.        Behavior: Behavior normal.     BP 138/72    Pulse 73    Temp (!) 96.5 F (35.8 C)    Resp 16    Wt 204 lb 3.2 oz (92.6 kg)    SpO2 96%    BMI 29.30 kg/m  Wt Readings from Last 3 Encounters:  08/15/19 204 lb 3.2 oz (92.6 kg)  06/30/19 202 lb (91.6 kg)  10/12/18 199 lb 9.6 oz (90.5 kg)     Lab Results  Component Value Date   WBC 5.6 06/30/2019   HGB 12.3 (L) 06/30/2019   HCT 38.4 (L) 06/30/2019    PLT 163.0 06/30/2019   GLUCOSE 107 (H) 07/19/2019   CHOL 143 06/30/2019   TRIG 102.0 06/30/2019   HDL 50.90 06/30/2019   LDLDIRECT 143.5 08/26/2013   LDLCALC 71 06/30/2019   ALT 9 06/30/2019   AST 12 06/30/2019   NA 141 07/19/2019   K 4.5 07/19/2019   CL 103 07/19/2019   CREATININE 1.06 07/19/2019   BUN 19 07/19/2019   CO2 30 07/19/2019   TSH 2.19 06/30/2019   HGBA1C 6.2 06/30/2019    Ct Chest Wo Contrast  Result Date: 08/02/2018 CLINICAL DATA:  85 year old male former smoker (quit 40 years ago). History of bladder cancer. Follow-up evaluation for lung nodule. EXAM: CT CHEST WITHOUT CONTRAST TECHNIQUE: Multidetector CT imaging of the chest was performed following the standard protocol without IV contrast. COMPARISON:  Chest CT 07/30/2017. FINDINGS: Cardiovascular: Heart size is normal. There is no significant pericardial fluid, thickening or pericardial calcification. There is aortic atherosclerosis, as well as atherosclerosis of the great vessels of the mediastinum and the coronary arteries, including calcified atherosclerotic plaque in the left main, left anterior descending, left circumflex and right coronary arteries. Calcifications of the aortic valve. Ectasia of the isthmus of the thoracic aorta which measures up to 4.2 cm in diameter. Mediastinum/Nodes: No pathologically enlarged mediastinal or hilar lymph nodes. Please note that accurate exclusion of hilar adenopathy is limited on noncontrast CT scans. Esophagus is unremarkable in appearance. No axillary lymphadenopathy. Lungs/Pleura: Previously noted 4 mm ground-glass attenuation nodule in the left upper lobe has resolved, indicative of a benign lesion. There are other 2-4 mm pulmonary  nodules in lungs bilaterally, stable compared to the prior study in retrospect, with the largest of these measuring 4 mm in the right lower lobe. No other larger more suspicious appearing pulmonary nodules or masses are noted. Scattered areas of mild  cylindrical bronchiectasis, most evident in the left lower lobe and right upper lobe. No acute consolidative airspace disease. No pleural effusions. Upper Abdomen: Incompletely imaged exophytic high attenuation lesion measuring at least 1 cm in the upper pole of the left kidney anteriorly is not characterized on today's noncontrast CT examination, but is similar to prior studies, presumably a small proteinaceous/hemorrhagic cyst. Aortic atherosclerosis. Musculoskeletal: There are no aggressive appearing lytic or blastic lesions noted in the visualized portions of the skeleton. IMPRESSION: 1. Tiny pulmonary nodules have either resolved or are stable compared to the prior study, as above, measuring 4 mm or less in size, considered definitively benign requiring no future imaging follow-up. 2. Scattered areas of mild cylindrical bronchiectasis again noted, likely secondary to prior infections. 3. Aortic atherosclerosis, in addition to left main and 3 vessel coronary artery disease. 4. Ectasia of the isthmus of the thoracic aorta measuring up to 4.2 cm in diameter, unchanged compared to prior studies. 5. There are calcifications of the aortic valve. Echocardiographic correlation for evaluation of potential valvular dysfunction may be warranted if clinically indicated. Aortic Atherosclerosis (ICD10-I70.0). Electronically Signed   By: Vinnie Langton M.D.   On: 08/02/2018 12:47       Assessment & Plan:   Problem List Items Addressed This Visit    Abnormal chest CT    Saw Dr Genevive Bi 08/09/18 for f/u pulmonary nodules.  Stable or regressed.  No further f/u warranted.        Anemia    Follow cbc.       Aortic aneurysm ([place])    Evaluated 07/2018 by Dr Genevive Bi.        Benign essential hypertension    Blood pressure under good control.  Continue same medication regimen.  Follow pressures.  Follow metabolic panel.        Bilateral lower extremity edema    Started on lasix last visit.  Swelling has improved  some.  Still present.  Has seen [facility].  Will refer back for reevaluation.  Check cxr.        Relevant Orders   Ambulatory referral to Vascular Surgery   CAD (coronary artery disease)    Seeing cardiology.  Stable.       CHF (congestive heart failure) (HCC)    History of CHF.  Started on lasix last visit.  Breathing stable.        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  Has been stable. Continue current medication regimen.       History of bladder cancer    Has been followed by urology.       Hypercholesterolemia    On pravastatin.  Low cholesterol diet and exercise.  Follow lipid panel and liver function tests.        Hyperglycemia    Low carb diet and exercise.  Follow met b and a1c.       Hypertension, benign    Blood pressure under good control.  Continue same medication regimen.  Follow pressures.  Follow metabolic panel.        Myasthenia gravis without exacerbation ([place])    Followed by neurology.        Other Visit Diagnoses    History of CHF (congestive heart failure)  Relevant Orders   DG Chest 2 View (Completed)       [name], MD

## 2019-08-20 ENCOUNTER — Encounter: Payer: Self-pay | Admitting: Internal Medicine

## 2019-08-20 NOTE — Assessment & Plan Note (Signed)
Blood pressure under good control.  Continue same medication regimen.  Follow pressures.  Follow metabolic panel.   

## 2019-08-20 NOTE — Assessment & Plan Note (Signed)
Evaluated 07/2018 by Dr Genevive Bi.

## 2019-08-20 NOTE — Assessment & Plan Note (Signed)
Low carb diet and exercise.  Follow met b and a1c.  

## 2019-08-20 NOTE — Assessment & Plan Note (Signed)
Has been followed by urology.   

## 2019-08-20 NOTE — Assessment & Plan Note (Signed)
History of CHF.  Started on lasix last visit.  Breathing stable.

## 2019-08-20 NOTE — Assessment & Plan Note (Signed)
Followed by neurology.  Has been stable. Continue current medication regimen.

## 2019-08-20 NOTE — Assessment & Plan Note (Addendum)
Started on lasix last visit.  Swelling has improved some.  Still present.  Has seen [facility].  Will refer back for reevaluation.  Check cxr.

## 2019-08-20 NOTE — Assessment & Plan Note (Signed)
Followed by neurology.   

## 2019-08-20 NOTE — Assessment & Plan Note (Signed)
On pravastatin.  Low cholesterol diet and exercise.  Follow lipid panel and liver function tests.   

## 2019-08-20 NOTE — Assessment & Plan Note (Signed)
Follow cbc.  

## 2019-08-20 NOTE — Assessment & Plan Note (Signed)
Seeing cardiology.  Stable.

## 2019-08-20 NOTE — Assessment & Plan Note (Signed)
Saw Dr Genevive Bi 08/09/18 for f/u pulmonary nodules.  Stable or regressed.  No further f/u warranted.

## 2019-08-30 ENCOUNTER — Ambulatory Visit (INDEPENDENT_AMBULATORY_CARE_PROVIDER_SITE_OTHER): Payer: Medicare Other | Admitting: Nurse Practitioner

## 2019-09-06 ENCOUNTER — Telehealth: Payer: Self-pay | Admitting: *Deleted

## 2019-09-06 NOTE — Telephone Encounter (Signed)
F/u on his right leg. Daughter stated that leg is not really any better and he is seeing Vascular tomorrow.

## 2019-09-06 NOTE — Telephone Encounter (Signed)
Copied from [place] [phone]. Topic: General - Call Back - No Documentation >> Sep 06, 2019  9:48 AM Erick Blinks wrote: Reason for CRM: Pt's daughter [name] called to report on pt's right leg with Larena Glassman. Please advise  Best contact: [phone]

## 2019-09-07 ENCOUNTER — Encounter (INDEPENDENT_AMBULATORY_CARE_PROVIDER_SITE_OTHER): Payer: Self-pay | Admitting: Nurse Practitioner

## 2019-09-07 ENCOUNTER — Other Ambulatory Visit: Payer: Self-pay

## 2019-09-07 ENCOUNTER — Ambulatory Visit (INDEPENDENT_AMBULATORY_CARE_PROVIDER_SITE_OTHER): Payer: Medicare Other | Admitting: Nurse Practitioner

## 2019-09-07 VITALS — BP 113/60 | HR 72 | Resp 17 | Ht 70.0 in | Wt 202.0 lb

## 2019-09-07 DIAGNOSIS — I890 Lymphedema, not elsewhere classified: Secondary | ICD-10-CM

## 2019-09-07 DIAGNOSIS — I10 Essential (primary) hypertension: Secondary | ICD-10-CM

## 2019-09-07 DIAGNOSIS — E782 Mixed hyperlipidemia: Secondary | ICD-10-CM

## 2019-09-11 ENCOUNTER — Encounter (INDEPENDENT_AMBULATORY_CARE_PROVIDER_SITE_OTHER): Payer: Self-pay | Admitting: Nurse Practitioner

## 2019-09-11 NOTE — Progress Notes (Signed)
SUBJECTIVE:  Patient ID: [name], male    DOB: Feb 13, 1926, 85 y.o.   MRN: [id number] Chief Complaint  Patient presents with  . Follow-up    referral for edema    HPI  [name] is a 85 y.o. male Patient is seen for evaluation of leg swelling. The patient first noticed the swelling remotely but is now concerned because of a significant increase in the overall edema. The swelling is associated with pain and discoloration. The patient notes that in the morning the legs are significantly improved but they steadily worsened throughout the course of the day. Elevation makes the legs better, dependency makes them much worse.   There is no history of ulcerations associated with the swelling.   The patient denies any recent changes in their medications.  The patient has not been wearing graduated compression.  The patient has no had any past angiography, interventions or vascular surgery.  The patient denies a history of DVT or PE. There is no prior history of phlebitis. There is no history of primary lymphedema.  There is no history of radiation treatment to the groin or pelvis No history of malignancies. No history of trauma or groin or pelvic surgery. No history of foreign travel or parasitic infections area    Past Medical History:  Diagnosis Date  . Abnormal chest CT 03/28/2017  . Allergy   . Anemia   . Angina pectoris ([place]) 04/02/2011  . Aortic aneurysm ([place]) 03/28/2017   Saw Dr Genevive Bi 04/16/17 - recommended f/u CT in 3 months.    . Atherosclerosis of both carotid arteries 07/17/2014  . Benign lipomatous neoplasm of skin and subcutaneous tissue of left arm 10/04/2013  . Bilateral carotid artery stenosis 08/30/2014   Overview:  Less than 50% 2015  . Bladder cancer ([place]) 03/05/2013  . CAD (coronary artery disease) 06/18/2014  . Cancer ([place]) 11-24-12   bladder. BCG treatments by Dr Jacqlyn Larsen.  . Chicken pox   . Chronic prostatitis 09/30/2012  . Colon polyp   . Congestive heart  failure ([place]) 04/20/2014   Overview:  Overview:  With anterior mi and moderate lv dyfunction ef 35%  . Dizziness 12/22/2016  . 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 Elevated prostate specific antigen (PSA) 09/30/2012  . Essential hypertension, benign   . Gross hematuria 09/30/2012  . Herpes zoster 11/28/2011   Overview:  with ocular involvement OD  . Hypercholesterolemia   . Hypertension, benign 04/02/2011  . Hypotension   . Moderate mitral insufficiency 05/18/2015  . Moderate tricuspid insufficiency 05/18/2015  . Myasthenia gravis ([place])   . Myasthenia gravis without exacerbation ([place]) 03/31/2011  . Shingles 2013    Past Surgical History:  Procedure Laterality Date  . APPENDECTOMY  1958  . CHOLECYSTECTOMY  1979  . HERNIA REPAIR  1970  . TONSILLECTOMY  1958    Social History   Socioeconomic History  . Marital status: Married    Spouse name: Not on file  . Number of children: Not on file  . Years of education: Not on file  . Highest education level: Not on file  Occupational History  . Not on file  Tobacco Use  . Smoking status: Former Smoker    Quit date: 09/29/1958    Years since quitting: 60.9  . Smokeless tobacco: Never Used  Substance and Sexual Activity  . Alcohol use: Yes    Alcohol/week: 0.0 standard drinks    Comment: occasionally  .  Drug use: No  . Sexual activity: Never  Other Topics Concern  . Not on file  Social History Narrative   Pt is married with 2 children - 1 son, 1 daughter. Previously self-employed in [employer]   Social Determinants of Health   Financial Resource Strain: [place]   . Difficulty of Paying Living Expenses: Not hard at all  Food Insecurity: No Food Insecurity  . Worried About Charity fundraiser in the Last Year: Never true  . Ran Out of Food in the Last Year: Never true  Transportation Needs: No Transportation Needs  . Lack of Transportation (Medical): No  . Lack of Transportation  (Non-Medical): No  Physical Activity:   . Days of Exercise per Week: Not on file  . Minutes of Exercise per Session: Not on file  Stress: No Stress Concern Present  . Feeling of Stress : Not at all  Social Connections:   . Frequency of Communication with Friends and Family: Not on file  . Frequency of Social Gatherings with Friends and Family: Not on file  . Attends Religious Services: Not on file  . Active Member of Clubs or Organizations: Not on file  . Attends Archivist Meetings: Not on file  . Marital Status: Not on file  Intimate Partner Violence: Not At Risk  . Fear of Current or Ex-Partner: No  . Emotionally Abused: No  . Physically Abused: No  . Sexually Abused: No    Family History  Problem Relation Age of Onset  . Lung cancer Sister   . Prostate cancer Brother   . Lung cancer Brother   . Arthritis Other        parent  . Colon cancer Neg Hx     Allergies  Allergen Reactions  . Beta Adrenergic Blockers Other (See Comments)    Beta Blocker will precipitate acute weakness in Lambert-Eaton Syndrome or Myasthenia Gravis  . Calcium Channel Blockers Other (See Comments)    Calcium Channel Blocker- will precipitate acute weakness in Lambert-Eaton Syndrome.  . Ciprofloxacin Other (See Comments)    Last resort due to Myasthia Gravis  . Sulfa Antibiotics Rash     Review of Systems   Review of Systems: Negative Unless Checked Constitutional: [] Weight loss  [] Fever  [] Chills Cardiac: [] Chest pain   []  Atrial Fibrillation  [] Palpitations   [] Shortness of breath when laying flat   [] Shortness of breath with exertion. [] Shortness of breath at rest Vascular:  [] Pain in legs with walking   [] Pain in legs with standing [] Pain in legs when laying flat   [] Claudication    [] Pain in feet when laying flat    [] History of DVT   [] Phlebitis   [x] Swelling in legs   [] Varicose veins   [x] Non-healing ulcers Pulmonary:   [] Uses home oxygen   [] Productive cough   [] Hemoptysis    [] Wheeze  [] COPD   [] Asthma Neurologic:  [] Dizziness   [] Seizures  [] Blackouts [] History of stroke   [] History of TIA  [] Aphasia   [] Temporary Blindness   [] Weakness or numbness in arm   [x] Weakness or numbness in leg Musculoskeletal:   [] Joint swelling   [] Joint pain   [] Low back pain  []  History of Knee Replacement [] Arthritis [] back Surgeries  []  Spinal Stenosis    Hematologic:  [] Easy bruising  [] Easy bleeding   [] Hypercoagulable state   [] Anemic Gastrointestinal:  [] Diarrhea   [] Vomiting  [] Gastroesophageal reflux/heartburn   [] Difficulty swallowing. [] Abdominal pain Genitourinary:  [] Chronic kidney disease   []   Difficult urination  [] Anuric   [] Blood in urine [] Frequent urination  [] Burning with urination   [] Hematuria Skin:  [] Rashes   [] Ulcers [] Wounds Psychological:  [] History of anxiety   []  History of major depression  []  Memory Difficulties      OBJECTIVE:   Physical Exam  BP 113/60 (BP Location: Right Arm)   Pulse 72   Resp 17   Ht 5\' 10"  (1.778 m)   Wt 202 lb (91.6 kg)   BMI 28.98 kg/m   Gen: WD/WN, NAD Head: [place]/AT, No temporalis wasting.  Ear/Nose/Throat: Hearing grossly intact, nares w/o erythema or drainage Eyes: PER, EOMI, sclera nonicteric.  Neck: Supple, no masses.  No JVD.  Pulmonary:  Good air movement, no use of accessory muscles.  Cardiac: RRR Vascular: 3+ hard edema bilaterally Vessel Right Left  Radial Palpable Palpable   Gastrointestinal: soft, non-distended. No guarding/no peritoneal signs.  Musculoskeletal: M/S 5/5 throughout.  No deformity or atrophy.  Neurologic: Pain and light touch intact in extremities.  Symmetrical.  Speech is fluent. Motor exam as listed above. Psychiatric: Judgment intact, Mood & affect appropriate for pt's clinical situation. Dermatologic: No Venous rashes. No Ulcers Noted.  No changes consistent with cellulitis. Lymph : No Cervical lymphadenopathy, no lichenification or skin changes of chronic lymphedema.        ASSESSMENT AND PLAN:  1. Lymphedema No surgery or intervention at this point in time.     Patient will be placed in [employer] which will be changed weekly drainage permitting.  In addition, behavioral modification including several periods of elevation of the lower extremities during the day will be continued. Achieving a position with the ankles at heart level was stressed to the patient  The patient is instructed to begin routine exercise, especially walking on a daily basis    Following the review of the ultrasound the patient will follow up in four weeks  to reassess the degree of swelling and the control that [place] therapy is offering.   The patient can be assessed for graduated compression stockings or wraps as well as a Lymph Pump at follow up  2. Hypertension, benign Continue antihypertensive medications as already ordered, these medications have been reviewed and there are no changes at this time.   3. Mixed hyperlipidemia Continue statin as ordered and reviewed, no changes at this time    Current Outpatient Medications on File Prior to Visit  Medication Sig Dispense Refill  . acetaminophen (TYLENOL) 500 MG tablet Take 500 mg by mouth every 6 (six) hours as needed.    Marland Kitchen aspirin 81 MG tablet Take 81 mg by mouth daily.    . finasteride (PROSCAR) 5 MG tablet Take 5 mg by mouth daily.    . furosemide (LASIX) 20 MG tablet Take 1 tablet (20 mg total) by mouth daily. 30 tablet 1  . hydroxypropyl methylcellulose / hypromellose (ISOPTO TEARS / GONIOVISC) 2.5 % ophthalmic solution Place 1 drop into both eyes daily.    . isosorbide mononitrate (IMDUR) 30 MG 24 hr tablet Take 30 mg by mouth daily.    Marland Kitchen lisinopril (PRINIVIL,ZESTRIL) 5 MG tablet Take 5 mg by mouth daily.    . mycophenolate (CELLCEPT) 250 MG capsule Take 1,000 mg by mouth 2 (two) times daily.     . pravastatin (PRAVACHOL) 20 MG tablet Take 1 tablet (20 mg total) by mouth daily. 90 tablet 1  . prednisoLONE acetate  (PRED FORTE) 1 % ophthalmic suspension Place 1 drop into the right eye 4 (four)  times daily.    . Probiotic Product (ALIGN) 4 MG CAPS One capsule q day 30 capsule 0  . pyridostigmine (MESTINON) 60 MG tablet Take 30 mg by mouth 6 (six) times daily.     . tamsulosin (FLOMAX) 0.4 MG CAPS Take 0.4 mg by mouth daily.     No current facility-administered medications on file prior to visit.    There are no Patient Instructions on file for this visit. No follow-ups on file.   Kris Hartmann, NP  This note was completed with Sales executive.  Any errors are purely unintentional.

## 2019-09-14 ENCOUNTER — Ambulatory Visit (INDEPENDENT_AMBULATORY_CARE_PROVIDER_SITE_OTHER): Payer: Medicare Other | Admitting: Nurse Practitioner

## 2019-09-14 ENCOUNTER — Other Ambulatory Visit: Payer: Self-pay

## 2019-09-14 VITALS — BP 118/62 | HR 70 | Resp 14 | Ht 70.0 in | Wt 205.0 lb

## 2019-09-14 DIAGNOSIS — R600 Localized edema: Secondary | ICD-10-CM | POA: Diagnosis not present

## 2019-09-14 DIAGNOSIS — M7989 Other specified soft tissue disorders: Secondary | ICD-10-CM

## 2019-09-14 NOTE — Progress Notes (Signed)
History of Present Illness  There is no documented history at this time  Assessments & Plan   There are no diagnoses linked to this encounter.    Additional instructions  Subjective:  Patient presents with venous ulcer of the Bilateral lower extremity.    Procedure:  3 layer unna wrap was placed Bilateral lower extremity.   Plan:   Follow up in one week.  

## 2019-09-21 ENCOUNTER — Other Ambulatory Visit: Payer: Self-pay

## 2019-09-21 ENCOUNTER — Ambulatory Visit (INDEPENDENT_AMBULATORY_CARE_PROVIDER_SITE_OTHER): Payer: Medicare Other | Admitting: Nurse Practitioner

## 2019-09-21 VITALS — BP 146/71 | HR 75 | Resp 17 | Ht 70.0 in | Wt 203.0 lb

## 2019-09-21 DIAGNOSIS — R600 Localized edema: Secondary | ICD-10-CM

## 2019-09-21 NOTE — Progress Notes (Signed)
History of Present Illness  There is no documented history at this time  Assessments & Plan   There are no diagnoses linked to this encounter.    Additional instructions  Subjective:  Patient presents with venous ulcer of the Bilateral lower extremity.    Procedure:  3 layer unna wrap was placed Bilateral lower extremity.   Plan:   Follow up in one week.  

## 2019-09-26 ENCOUNTER — Encounter (INDEPENDENT_AMBULATORY_CARE_PROVIDER_SITE_OTHER): Payer: Self-pay | Admitting: Nurse Practitioner

## 2019-09-26 DIAGNOSIS — B028 Zoster with other complications: Secondary | ICD-10-CM | POA: Diagnosis not present

## 2019-09-27 ENCOUNTER — Ambulatory Visit (INDEPENDENT_AMBULATORY_CARE_PROVIDER_SITE_OTHER): Payer: Medicare Other | Admitting: Nurse Practitioner

## 2019-09-27 ENCOUNTER — Encounter (INDEPENDENT_AMBULATORY_CARE_PROVIDER_SITE_OTHER): Payer: Self-pay | Admitting: Nurse Practitioner

## 2019-09-27 ENCOUNTER — Other Ambulatory Visit: Payer: Self-pay

## 2019-09-27 VITALS — BP 162/77 | HR 16 | Ht 70.0 in | Wt 200.0 lb

## 2019-09-27 DIAGNOSIS — E7800 Pure hypercholesterolemia, unspecified: Secondary | ICD-10-CM

## 2019-09-27 DIAGNOSIS — I10 Essential (primary) hypertension: Secondary | ICD-10-CM | POA: Diagnosis not present

## 2019-09-27 DIAGNOSIS — R600 Localized edema: Secondary | ICD-10-CM

## 2019-10-02 ENCOUNTER — Encounter (INDEPENDENT_AMBULATORY_CARE_PROVIDER_SITE_OTHER): Payer: Self-pay | Admitting: Nurse Practitioner

## 2019-10-02 NOTE — Progress Notes (Signed)
SUBJECTIVE:  Patient ID: [name], male    DOB: January 26, 1926, 86 y.o.   MRN: [id number] Chief Complaint  Patient presents with  . Follow-up    unna check    HPI  [name] is a 86 y.o. male The patient returns to the office for followup evaluation regarding leg swelling.  The swelling has improved quite a bit and the pain associated with swelling has decreased substantially. There have not been any interval development of a ulcerations or wounds.  Since the previous visit the patient has been wearing Elby Showers and has noted improvement in the lymphedema. The patient has been using compression routinely morning until night.  The patient also states elevation during the day and exercise is being done too.     Past Medical History:  Diagnosis Date  . Abnormal chest CT 03/28/2017  . Allergy   . Anemia   . Angina pectoris ([place]) 04/02/2011  . Aortic aneurysm ([place]) 03/28/2017   Saw Dr Genevive Bi 04/16/17 - recommended f/u CT in 3 months.    . Atherosclerosis of both carotid arteries 07/17/2014  . Benign lipomatous neoplasm of skin and subcutaneous tissue of left arm 10/04/2013  . Bilateral carotid artery stenosis 08/30/2014   Overview:  Less than 50% 2015  . Bladder cancer ([place]) 03/05/2013  . CAD (coronary artery disease) 06/18/2014  . Cancer ([place]) 11-24-12   bladder. BCG treatments by Dr Jacqlyn Larsen.  . Chicken pox   . Chronic prostatitis 09/30/2012  . Colon polyp   . Congestive heart failure ([place]) 04/20/2014   Overview:  Overview:  With anterior mi and moderate lv dyfunction ef 35%  . Dizziness 12/22/2016  . 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 Eaton-Lambert syndrome ([place])   . Elevated prostate specific antigen (PSA) 09/30/2012  . Essential hypertension, benign   . Gross hematuria 09/30/2012  . Herpes zoster 11/28/2011   Overview:  with ocular involvement OD  . Hypercholesterolemia   . Hypertension, benign 04/02/2011  . Hypotension   . Moderate mitral insufficiency 05/18/2015    . Moderate tricuspid insufficiency 05/18/2015  . Myasthenia gravis ([place])   . Myasthenia gravis without exacerbation ([place]) 03/31/2011  . Shingles 2013    Past Surgical History:  Procedure Laterality Date  . APPENDECTOMY  1958  . CHOLECYSTECTOMY  1979  . HERNIA REPAIR  1970  . TONSILLECTOMY  1958    Social History   Socioeconomic History  . Marital status: Married    Spouse name: Not on file  . Number of children: Not on file  . Years of education: Not on file  . Highest education level: Not on file  Occupational History  . Not on file  Tobacco Use  . Smoking status: Former Smoker    Quit date: 09/29/1958    Years since quitting: 61.0  . Smokeless tobacco: Never Used  Substance and Sexual Activity  . Alcohol use: Yes    Alcohol/week: 0.0 standard drinks    Comment: occasionally  . Drug use: No  . Sexual activity: Never  Other Topics Concern  . Not on file  Social History Narrative   Pt is married with 2 children - 1 son, 1 daughter. Previously self-employed in [employer]   Social Determinants of Health   Financial Resource Strain: [place]   . Difficulty of Paying Living Expenses: Not hard at all  Food Insecurity: No Food Insecurity  . Worried About Charity fundraiser in the Last Year: Never true  .  Ran Out of Food in the Last Year: Never true  Transportation Needs: No Transportation Needs  . Lack of Transportation (Medical): No  . Lack of Transportation (Non-Medical): No  Physical Activity:   . Days of Exercise per Week: Not on file  . Minutes of Exercise per Session: Not on file  Stress: No Stress Concern Present  . Feeling of Stress : Not at all  Social Connections:   . Frequency of Communication with Friends and Family: Not on file  . Frequency of Social Gatherings with Friends and Family: Not on file  . Attends Religious Services: Not on file  . Active Member of Clubs or Organizations: Not on file  . Attends Archivist Meetings: Not on file   . Marital Status: Not on file  Intimate Partner Violence: Not At Risk  . Fear of Current or Ex-Partner: No  . Emotionally Abused: No  . Physically Abused: No  . Sexually Abused: No    Family History  Problem Relation Age of Onset  . Lung cancer Sister   . Prostate cancer Brother   . Lung cancer Brother   . Arthritis Other        parent  . Colon cancer Neg Hx     Allergies  Allergen Reactions  . Beta Adrenergic Blockers Other (See Comments)    Beta Blocker will precipitate acute weakness in Lambert-Eaton Syndrome or Myasthenia Gravis  . Calcium Channel Blockers Other (See Comments)    Calcium Channel Blocker- will precipitate acute weakness in Lambert-Eaton Syndrome.  . Ciprofloxacin Other (See Comments)    Last resort due to Myasthia Gravis  . Sulfa Antibiotics Rash     Review of Systems   Review of Systems: Negative Unless Checked Constitutional: [] Weight loss  [] Fever  [] Chills Cardiac: [] Chest pain   []  Atrial Fibrillation  [] Palpitations   [] Shortness of breath when laying flat   [] Shortness of breath with exertion. [] Shortness of breath at rest Vascular:  [] Pain in legs with walking   [] Pain in legs with standing [] Pain in legs when laying flat   [] Claudication    [] Pain in feet when laying flat    [] History of DVT   [] Phlebitis   [x] Swelling in legs   [] Varicose veins   [] Non-healing ulcers Pulmonary:   [] Uses home oxygen   [] Productive cough   [] Hemoptysis   [] Wheeze  [] COPD   [] Asthma Neurologic:  [] Dizziness   [] Seizures  [] Blackouts [] History of stroke   [] History of TIA  [] Aphasia   [] Temporary Blindness   [] Weakness or numbness in arm   [] Weakness or numbness in leg Musculoskeletal:   [] Joint swelling   [] Joint pain   [] Low back pain  []  History of Knee Replacement [] Arthritis [] back Surgeries  []  Spinal Stenosis    Hematologic:  [] Easy bruising  [] Easy bleeding   [] Hypercoagulable state   [] Anemic Gastrointestinal:  [] Diarrhea   [] Vomiting  [] Gastroesophageal  reflux/heartburn   [] Difficulty swallowing. [] Abdominal pain Genitourinary:  [] Chronic kidney disease   [] Difficult urination  [] Anuric   [] Blood in urine [] Frequent urination  [] Burning with urination   [] Hematuria Skin:  [] Rashes   [] Ulcers [] Wounds Psychological:  [] History of anxiety   []  History of major depression  []  Memory Difficulties      OBJECTIVE:   Physical Exam  BP (!) 162/77 (BP Location: Right Arm)   Pulse (!) 16   Ht 5\' 10"  (1.778 m)   Wt 200 lb (90.7 kg)   BMI 28.70 kg/m   Gen:  WD/WN, NAD Head: /AT, No temporalis wasting.  Ear/Nose/Throat: Hearing grossly intact, nares w/o erythema or drainage Eyes: PER, EOMI, sclera nonicteric.  Neck: Supple, no masses.  No JVD.  Pulmonary:  Good air movement, no use of accessory muscles.  Cardiac: RRR Vascular: 2+ edema bilaterally  Vessel Right Left  Radial Palpable Palpable  Dorsalis Pedis Palpable Palpable  Posterior Tibial Palpable Palpable   Gastrointestinal: soft, non-distended. No guarding/no peritoneal signs.  Musculoskeletal: M/S 5/5 throughout.  No deformity or atrophy.  Neurologic: Pain and light touch intact in extremities.  Symmetrical.  Speech is fluent. Motor exam as listed above. Psychiatric: Judgment intact, Mood & affect appropriate for pt's clinical situation. Dermatologic:Mild stasis dermatitis. No Ulcers Noted.  No changes consistent with cellulitis. Lymph : No Cervical lymphadenopathy, dermal thickening       ASSESSMENT AND PLAN:  1. Bilateral lower extremity edema I have had a long discussion with the patient regarding swelling and why it  causes symptoms.  Patient will begin wearing graduated compression stockings class 1 (20-30 mmHg) on a daily basis a prescription was given. The patient will  beginning wearing the stockings first thing in the morning and removing them in the evening. The patient is instructed specifically not to sleep in the stockings.   In addition, behavioral modification  will be initiated.  This will include frequent elevation, use of over the counter pain medications and exercise such as walking.  I have reviewed systemic causes for chronic edema such as liver, kidney and cardiac etiologies.  The patient denies problems with these organ systems.    Consideration for a lymph pump will also be made based upon the effectiveness of conservative therapy.  This would help to improve the edema control and prevent sequela such as ulcers and infections   Patient should undergo duplex ultrasound of the venous system to ensure that DVT or reflux is not present.  The patient will follow-up with me after the ultrasound.    2. Hypertension, benign Continue antihypertensive medications as already ordered, these medications have been reviewed and there are no changes at this time.   3. Hypercholesterolemia Continue statin as ordered and reviewed, no changes at this time    Current Outpatient Medications on File Prior to Visit  Medication Sig Dispense Refill  . acetaminophen (TYLENOL) 500 MG tablet Take 500 mg by mouth every 6 (six) hours as needed.    Marland Kitchen aspirin 81 MG tablet Take 81 mg by mouth daily.    . finasteride (PROSCAR) 5 MG tablet Take 5 mg by mouth daily.    . furosemide (LASIX) 20 MG tablet Take 1 tablet (20 mg total) by mouth daily. 30 tablet 1  . hydroxypropyl methylcellulose / hypromellose (ISOPTO TEARS / GONIOVISC) 2.5 % ophthalmic solution Place 1 drop into both eyes daily.    . isosorbide mononitrate (IMDUR) 30 MG 24 hr tablet Take 30 mg by mouth daily.    Marland Kitchen lisinopril (PRINIVIL,ZESTRIL) 5 MG tablet Take 5 mg by mouth daily.    . mycophenolate (CELLCEPT) 250 MG capsule Take 1,000 mg by mouth 2 (two) times daily.     . pravastatin (PRAVACHOL) 20 MG tablet Take 1 tablet (20 mg total) by mouth daily. 90 tablet 1  . prednisoLONE acetate (PRED FORTE) 1 % ophthalmic suspension Place 1 drop into the right eye 4 (four) times daily.    . Probiotic Product  (ALIGN) 4 MG CAPS One capsule q day 30 capsule 0  . pyridostigmine (MESTINON) 60 MG tablet  Take 30 mg by mouth 6 (six) times daily.     . tamsulosin (FLOMAX) 0.4 MG CAPS Take 0.4 mg by mouth daily.     No current facility-administered medications on file prior to visit.    There are no Patient Instructions on file for this visit. No follow-ups on file.   Kris Hartmann, NP  This note was completed with Sales executive.  Any errors are purely unintentional.

## 2019-10-20 ENCOUNTER — Ambulatory Visit (INDEPENDENT_AMBULATORY_CARE_PROVIDER_SITE_OTHER): Payer: Medicare Other | Admitting: Internal Medicine

## 2019-10-20 ENCOUNTER — Other Ambulatory Visit: Payer: Self-pay

## 2019-10-20 ENCOUNTER — Telehealth: Payer: Self-pay | Admitting: Internal Medicine

## 2019-10-20 VITALS — Ht 70.0 in | Wt 200.0 lb

## 2019-10-20 DIAGNOSIS — D649 Anemia, unspecified: Secondary | ICD-10-CM

## 2019-10-20 DIAGNOSIS — I509 Heart failure, unspecified: Secondary | ICD-10-CM

## 2019-10-20 DIAGNOSIS — E7800 Pure hypercholesterolemia, unspecified: Secondary | ICD-10-CM | POA: Diagnosis not present

## 2019-10-20 DIAGNOSIS — I10 Essential (primary) hypertension: Secondary | ICD-10-CM

## 2019-10-20 DIAGNOSIS — R739 Hyperglycemia, unspecified: Secondary | ICD-10-CM | POA: Diagnosis not present

## 2019-10-20 DIAGNOSIS — I712 Thoracic aortic aneurysm, without rupture, unspecified: Secondary | ICD-10-CM

## 2019-10-20 DIAGNOSIS — I2510 Atherosclerotic heart disease of native coronary artery without angina pectoris: Secondary | ICD-10-CM

## 2019-10-20 DIAGNOSIS — R600 Localized edema: Secondary | ICD-10-CM

## 2019-10-20 DIAGNOSIS — Z8551 Personal history of malignant neoplasm of bladder: Secondary | ICD-10-CM

## 2019-10-20 DIAGNOSIS — G7000 Myasthenia gravis without (acute) exacerbation: Secondary | ICD-10-CM

## 2019-10-20 DIAGNOSIS — G7080 Lambert-Eaton syndrome, unspecified: Secondary | ICD-10-CM

## 2019-10-20 NOTE — Telephone Encounter (Signed)
Unable to lm to schedule fasting labs in 1-2 weeks and a 69m follow up

## 2019-10-20 NOTE — Progress Notes (Signed)
Patient ID: [name], male   DOB: 27-Jun-1926, 86 y.o.   MRN: [id number]   Virtual Visit via telephone Note  This visit type was conducted due to national recommendations for restrictions regarding the COVID-19 pandemic (e.g. social distancing).  This format is felt to be most appropriate for this patient at this time.  All issues noted in this document were discussed and addressed.  No physical exam was performed (except for noted visual exam findings with Video Visits).   I connected with@ today at 11:30 AM EST by a video enabled telemedicine application or telephone and verified that I am speaking with the correct person using two identifiers. Location patient: home Location provider: work or home office Persons participating in the virtual visit: patient, provider  I discussed the limitations, risks, security and privacy concerns of performing an evaluation and management service by telephone and the availability of in person appointments. I also discussed with the patient that there may be a patient responsible charge related to this service. The patient expressed understanding and agreed to proceed.   Reason for visit:  Scheduled follow up.    HPI: He reports he is doing relatively well.  Staying active around his house.  Coping with his wife's death.  Has good family support.  No chest pain.  No sob.  No acid reflux.  No abdominal pain.  Bowels moving.  Some occasional cramps in arms legs.  Intermittent.  Feels is staying hydrated.  Leg swelling is better.  Saw [facility].  S/p - wraps.  Doing well. States skin looks good.  Has f/u with cardiology next month.  Some increased urination.  Sees urology.  They have him on flomax.  Has f/u planned soon to discuss.     ROS: See pertinent positives and negatives per HPI.  Past Medical History:  Diagnosis Date   Abnormal chest CT 03/28/2017   Allergy    Anemia    Angina pectoris ([place]) 04/02/2011   Aortic aneurysm ([place]) 03/28/2017   Saw Dr  Genevive Bi 04/16/17 - recommended f/u CT in 3 months.     Atherosclerosis of both carotid arteries 07/17/2014   Benign lipomatous neoplasm of skin and subcutaneous tissue of left arm 10/04/2013   Bilateral carotid artery stenosis 08/30/2014   Overview:  Less than 50% 2015   Bladder cancer ([place]) 03/05/2013   CAD (coronary artery disease) 06/18/2014   Cancer ([place]) 11-24-12   bladder. BCG treatments by Dr Jacqlyn Larsen.   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   Eaton-Lambert syndrome ([facility])    Elevated prostate specific antigen (PSA) 09/30/2012   Essential hypertension, benign    Gross hematuria 09/30/2012   Herpes zoster 11/28/2011   Overview:  with ocular involvement OD   Hypercholesterolemia    Hypertension, benign 04/02/2011   Hypotension    Moderate mitral insufficiency 05/18/2015   Moderate tricuspid insufficiency 05/18/2015   Myasthenia gravis ([place])    Myasthenia gravis without exacerbation ([place]) 03/31/2011   Shingles 2013    Past Surgical History:  Procedure Laterality Date   [place]    Family History  Problem Relation Age of Onset   Lung cancer Sister  Prostate cancer Brother    Lung cancer Brother    Arthritis Other        parent   Colon cancer Neg Hx     SOCIAL HX: reviewed.    Current Outpatient Medications:    acetaminophen (TYLENOL) 500 MG tablet, Take 500 mg by mouth every 6 (six) hours as needed., Disp: , Rfl:    aspirin 81 MG tablet, Take 81 mg by mouth daily., Disp: , Rfl:    finasteride (PROSCAR) 5 MG tablet, Take 5 mg by mouth daily., Disp: , Rfl:    furosemide (LASIX) 20 MG tablet, Take 1 tablet (20 mg total) by mouth daily., Disp: 30 tablet, Rfl: 1    hydroxypropyl methylcellulose / hypromellose (ISOPTO TEARS / GONIOVISC) 2.5 % ophthalmic solution, Place 1 drop into both eyes daily., Disp: , Rfl:    isosorbide mononitrate (IMDUR) 30 MG 24 hr tablet, Take 30 mg by mouth daily., Disp: , Rfl:    lisinopril (PRINIVIL,ZESTRIL) 5 MG tablet, Take 5 mg by mouth daily., Disp: , Rfl:    mycophenolate (CELLCEPT) 250 MG capsule, Take 1,000 mg by mouth 2 (two) times daily. , Disp: , Rfl:    pravastatin (PRAVACHOL) 20 MG tablet, Take 1 tablet (20 mg total) by mouth daily., Disp: 90 tablet, Rfl: 1   prednisoLONE acetate (PRED FORTE) 1 % ophthalmic suspension, Place 1 drop into the right eye 4 (four) times daily., Disp: , Rfl:    Probiotic Product (ALIGN) 4 MG CAPS, One capsule q day, Disp: 30 capsule, Rfl: 0   pyridostigmine (MESTINON) 60 MG tablet, Take 30 mg by mouth 6 (six) times daily. , Disp: , Rfl:    tamsulosin (FLOMAX) 0.4 MG CAPS, Take 0.4 mg by mouth daily., Disp: , Rfl:   EXAM:  GENERAL: alert.  Sounds to be in no acute distress.  Answering questions appropriately.    PSYCH/NEURO: pleasant and cooperative, no obvious depression or anxiety, speech and thought processing grossly intact  ASSESSMENT AND PLAN:  Discussed the following assessment and plan:  Anemia Follow cbc.   Aortic aneurysm [facility] Evaluated 07/2018 - Dr Genevive Bi.  Note reviewed.  Stable.  Per note, will see back on an as needed basis.    Benign essential hypertension Blood pressure under good control.  Continue same medication regimen.  Follow pressures.  Follow metabolic panel.    Bilateral lower extremity edema Saw [facility].  S/p wraps.  Swelling better.  Doing well.  Remains on lasix. Schedule f/u metabolic panel.   CAD (coronary artery disease) Has seen cardiology. No chest pain or sob.  Continue risk factor modification.  Follow.  Has f/u with cardiology next month.    CHF (congestive heart failure) ([place]) Has a history of CHF.  No evidence of volume overload per  report today.  On lasix.  Check metabolic panel.    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  Stable.    History of bladder cancer Has been followed by urology.    Hypercholesterolemia On pravastatin.  Low cholesterol diet and exercise.  Follow lipid panel and liver function tests.    Hyperglycemia Low carb diet and exercise.  Follow met b and a1c.    Hypertension, benign Blood pressure under good control.  Continue same medication regimen.  Follow pressures.  Follow metabolic panel.    Myasthenia gravis without exacerbation ([place]) Followed by neurology.  Stable.     Orders Placed This Encounter  Procedures   CBC with Differential/Platelet    Standing Status:  Future    Standing Expiration Date:   10/19/2020   Ferritin    Standing Status:   Future    Standing Expiration Date:   10/19/2020   Hepatic function panel    Standing Status:   Future    Standing Expiration Date:   10/19/2020   Lipid panel    Standing Status:   Future    Standing Expiration Date:   10/19/2020   TSH    Standing Status:   Future    Standing Expiration Date:   0/50/5678   Basic metabolic panel    Standing Status:   Future    Standing Expiration Date:   10/19/2020   Hemoglobin A1c    Standing Status:   Future    Standing Expiration Date:   10/19/2020     I discussed the assessment and treatment plan with the patient. The patient was provided an opportunity to ask questions and all were answered. The patient agreed with the plan and demonstrated an understanding of the instructions.   The patient was advised to call back or seek an in-person evaluation if the symptoms worsen or if the condition fails to improve as anticipated.  I provided 25 minutes of non-face-to-face time during this encounter.   [name], MD

## 2019-10-22 ENCOUNTER — Encounter: Payer: Self-pay | Admitting: Internal Medicine

## 2019-10-22 NOTE — Assessment & Plan Note (Addendum)
Has seen cardiology. No chest pain or sob.  Continue risk factor modification.  Follow.  Has f/u with cardiology next month.

## 2019-10-22 NOTE — Assessment & Plan Note (Signed)
Followed by neurology.  Stable  

## 2019-10-22 NOTE — Assessment & Plan Note (Signed)
Evaluated 07/2018 - Dr Genevive Bi.  Note reviewed.  Stable.  Per note, will see back on an as needed basis.

## 2019-10-22 NOTE — Assessment & Plan Note (Signed)
Follow cbc.  

## 2019-10-22 NOTE — Assessment & Plan Note (Signed)
Low carb diet and exercise.  Follow met b and a1c.   

## 2019-10-22 NOTE — Assessment & Plan Note (Signed)
On pravastatin.  Low cholesterol diet and exercise.  Follow lipid panel and liver function tests.   

## 2019-10-22 NOTE — Assessment & Plan Note (Signed)
Saw [facility].  S/p wraps.  Swelling better.  Doing well.  Remains on lasix. Schedule f/u metabolic panel.

## 2019-10-22 NOTE — Assessment & Plan Note (Signed)
Has been followed by urology.   

## 2019-10-22 NOTE — Assessment & Plan Note (Signed)
Has a history of CHF.  No evidence of volume overload per report today.  On lasix.  Check metabolic panel.

## 2019-10-22 NOTE — Assessment & Plan Note (Signed)
Blood pressure under good control.  Continue same medication regimen.  Follow pressures.  Follow metabolic panel.   

## 2019-11-16 DIAGNOSIS — I10 Essential (primary) hypertension: Secondary | ICD-10-CM | POA: Diagnosis not present

## 2019-11-16 DIAGNOSIS — I6523 Occlusion and stenosis of bilateral carotid arteries: Secondary | ICD-10-CM | POA: Diagnosis not present

## 2019-11-16 DIAGNOSIS — I5022 Chronic systolic (congestive) heart failure: Secondary | ICD-10-CM | POA: Diagnosis not present

## 2019-11-16 DIAGNOSIS — I2510 Atherosclerotic heart disease of native coronary artery without angina pectoris: Secondary | ICD-10-CM | POA: Diagnosis not present

## 2019-12-22 ENCOUNTER — Other Ambulatory Visit (INDEPENDENT_AMBULATORY_CARE_PROVIDER_SITE_OTHER): Payer: Self-pay | Admitting: Nurse Practitioner

## 2019-12-22 DIAGNOSIS — R600 Localized edema: Secondary | ICD-10-CM

## 2019-12-27 ENCOUNTER — Encounter (INDEPENDENT_AMBULATORY_CARE_PROVIDER_SITE_OTHER): Payer: Self-pay | Admitting: Vascular Surgery

## 2019-12-27 ENCOUNTER — Other Ambulatory Visit: Payer: Self-pay

## 2019-12-27 ENCOUNTER — Ambulatory Visit (INDEPENDENT_AMBULATORY_CARE_PROVIDER_SITE_OTHER): Payer: Medicare Other

## 2019-12-27 ENCOUNTER — Ambulatory Visit (INDEPENDENT_AMBULATORY_CARE_PROVIDER_SITE_OTHER): Payer: Medicare Other | Admitting: Vascular Surgery

## 2019-12-27 VITALS — BP 102/66 | HR 75 | Ht 70.0 in | Wt 210.0 lb

## 2019-12-27 DIAGNOSIS — I10 Essential (primary) hypertension: Secondary | ICD-10-CM | POA: Diagnosis not present

## 2019-12-27 DIAGNOSIS — M7989 Other specified soft tissue disorders: Secondary | ICD-10-CM

## 2019-12-27 DIAGNOSIS — R600 Localized edema: Secondary | ICD-10-CM

## 2019-12-27 DIAGNOSIS — L97221 Non-pressure chronic ulcer of left calf limited to breakdown of skin: Secondary | ICD-10-CM | POA: Insufficient documentation

## 2019-12-27 DIAGNOSIS — E7800 Pure hypercholesterolemia, unspecified: Secondary | ICD-10-CM | POA: Diagnosis not present

## 2019-12-27 DIAGNOSIS — R0989 Other specified symptoms and signs involving the circulatory and respiratory systems: Secondary | ICD-10-CM

## 2019-12-27 DIAGNOSIS — I872 Venous insufficiency (chronic) (peripheral): Secondary | ICD-10-CM

## 2019-12-27 NOTE — Assessment & Plan Note (Signed)
A 3 layer Unna boot was placed today and will be changed weekly.  Reassess in 3 to 4 weeks in the office.

## 2019-12-27 NOTE — Progress Notes (Signed)
MRN : [id number]  [name] is a 86 y.o. (May 28, 1926) male who presents with chief complaint of  Chief Complaint  Patient presents with  . Follow-up    U/S Follow up  .  History of Present Illness: Patient returns today in follow up of his lower extremity swelling.  Since his last visit, his swelling has gotten noticeably worse particular on the left leg.  He is now having weeping and drainage from his legs.  Last year, he spent several weeks in Unna boots getting a similar situation under control.  The right leg has some swelling but no weeping or ulceration currently.  The swelling is moderate on the right side.  No fevers or chills.  No recent trauma or injury.  Noninvasive studies today demonstrate only reflux at the saphenofemoral junction on the right and deep venous reflux on the left.  No DVT or superficial thrombophlebitis.  Current Outpatient Medications  Medication Sig Dispense Refill  . acetaminophen (TYLENOL) 500 MG tablet Take 500 mg by mouth every 6 (six) hours as needed.    Marland Kitchen aspirin 81 MG tablet Take 81 mg by mouth daily.    . finasteride (PROSCAR) 5 MG tablet Take 5 mg by mouth daily.    . furosemide (LASIX) 20 MG tablet Take 1 tablet (20 mg total) by mouth daily. 30 tablet 1  . hydroxypropyl methylcellulose / hypromellose (ISOPTO TEARS / GONIOVISC) 2.5 % ophthalmic solution Place 1 drop into both eyes daily.    . isosorbide mononitrate (IMDUR) 30 MG 24 hr tablet Take 30 mg by mouth daily.    Marland Kitchen lisinopril (PRINIVIL,ZESTRIL) 5 MG tablet Take 5 mg by mouth daily.    . mycophenolate (CELLCEPT) 250 MG capsule Take 1,000 mg by mouth 2 (two) times daily.     . pravastatin (PRAVACHOL) 20 MG tablet Take 1 tablet (20 mg total) by mouth daily. 90 tablet 1  . prednisoLONE acetate (PRED FORTE) 1 % ophthalmic suspension Place 1 drop into the right eye 4 (four) times daily.    . Probiotic Product (ALIGN) 4 MG CAPS One capsule q day 30 capsule 0  . pyridostigmine (MESTINON) 60 MG  tablet Take 30 mg by mouth 6 (six) times daily.     . tamsulosin (FLOMAX) 0.4 MG CAPS Take 0.4 mg by mouth daily.     No current facility-administered medications for this visit.    Past Medical History:  Diagnosis Date  . Abnormal chest CT 03/28/2017  . Allergy   . Anemia   . Angina pectoris ([place]) 04/02/2011  . Aortic aneurysm ([place]) 03/28/2017   Saw Dr Genevive Bi 04/16/17 - recommended f/u CT in 3 months.    . Atherosclerosis of both carotid arteries 07/17/2014  . Benign lipomatous neoplasm of skin and subcutaneous tissue of left arm 10/04/2013  . Bilateral carotid artery stenosis 08/30/2014   Overview:  Less than 50% 2015  . Bladder cancer ([place]) 03/05/2013  . CAD (coronary artery disease) 06/18/2014  . Cancer ([place]) 11-24-12   bladder. BCG treatments by Dr Jacqlyn Larsen.  . Chicken pox   . Chronic prostatitis 09/30/2012  . Colon polyp   . Congestive heart failure ([place]) 04/20/2014   Overview:  Overview:  With anterior mi and moderate lv dyfunction ef 35%  . Dizziness 12/22/2016  . 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 Elevated prostate specific antigen (PSA) 09/30/2012  . Essential hypertension, benign   . Gross hematuria 09/30/2012  . Herpes  zoster 11/28/2011   Overview:  with ocular involvement OD  . Hypercholesterolemia   . Hypertension, benign 04/02/2011  . Hypotension   . Moderate mitral insufficiency 05/18/2015  . Moderate tricuspid insufficiency 05/18/2015  . Myasthenia gravis ([place])   . Myasthenia gravis without exacerbation ([place]) 03/31/2011  . Shingles 2013    Past Surgical History:  Procedure Laterality Date  . APPENDECTOMY  1958  . CHOLECYSTECTOMY  1979  . HERNIA REPAIR  1970  . TONSILLECTOMY  1958     Social History   Tobacco Use  . Smoking status: Former Smoker    Quit date: 09/29/1958    Years since quitting: 61.2  . Smokeless tobacco: Never Used  Substance Use Topics  . Alcohol use: Yes    Alcohol/week: 0.0 standard drinks    Comment:  occasionally  . Drug use: No    Family History  Problem Relation Age of Onset  . Lung cancer Sister   . Prostate cancer Brother   . Lung cancer Brother   . Arthritis Other        parent  . Colon cancer Neg Hx      Allergies  Allergen Reactions  . Beta Adrenergic Blockers Other (See Comments)    Beta Blocker will precipitate acute weakness in Lambert-Eaton Syndrome or Myasthenia Gravis  . Calcium Channel Blockers Other (See Comments)    Calcium Channel Blocker- will precipitate acute weakness in Lambert-Eaton Syndrome.  . Ciprofloxacin Other (See Comments)    Last resort due to Myasthia Gravis  . Sulfa Antibiotics Rash    REVIEW OF SYSTEMS (Negative unless checked)  Constitutional: [] ?Weight loss  [] ?Fever  [] ?Chills Cardiac: [] ?Chest pain   [] ?Chest pressure   [] ?Palpitations   [] ?Shortness of breath when laying flat   [] ?Shortness of breath at rest   [] ?Shortness of breath with exertion. Vascular:  [] ?Pain in legs with walking   [] ?Pain in legs at rest   [] ?Pain in legs when laying flat   [] ?Claudication   [] ?Pain in feet when walking  [] ?Pain in feet at rest  [] ?Pain in feet when laying flat   [] ?History of DVT   [] ?Phlebitis   [x] ?Swelling in legs   [] ?Varicose veins   [x] ?Non-healing ulcers Pulmonary:   [] ?Uses home oxygen   [] ?Productive cough   [] ?Hemoptysis   [] ?Wheeze  [] ?COPD   [] ?Asthma Neurologic:  [] ?Dizziness  [] ?Blackouts   [] ?Seizures   [] ?History of stroke   [] ?History of TIA  [] ?Aphasia   [] ?Temporary blindness   [] ?Dysphagia   [] ?Weakness or numbness in arms   [] ?Weakness or numbness in legs Musculoskeletal:  [x] ?Arthritis   [] ?Joint swelling   [] ?Joint pain   [] ?Low back pain Hematologic:  [] ?Easy bruising  [] ?Easy bleeding   [] ?Hypercoagulable state   [x] ?Anemic  [] ?Hepatitis Gastrointestinal:  [] ?Blood in stool   [] ?Vomiting blood  [] ?Gastroesophageal reflux/heartburn   [] ?Abdominal pain Genitourinary:  [] ?Chronic kidney disease   [] ?Difficult urination   [] ?Frequent urination  [] ?Burning with urination   [x] ?Hematuria Skin:  [] ?Rashes   [x] ?Ulcers   [x] ?Wounds Psychological:  [] ?History of anxiety   [] ? History of major depression.  Physical Examination  BP 102/66   Pulse 75   Ht 5\' 10"  (1.778 m)   Wt 210 lb (95.3 kg)   BMI 30.13 kg/m  Gen:  WD/WN, NAD.  Appears younger than stated age Head: [place]/AT, No temporalis wasting. Ear/Nose/Throat: Hearing grossly intact, nares w/o erythema or drainage Eyes: Conjunctiva clear. Sclera non-icteric Neck: Supple.  Trachea midline Pulmonary:  Good air  movement, no use of accessory muscles.  Cardiac: RRR, no JVD Vascular:  Vessel Right Left  Radial Palpable Palpable                          PT Not Palpable Not Palpable  DP Not Palpable Not Palpable    Musculoskeletal: M/S 5/5 throughout.  No deformity or atrophy.  1-2+ right lower extremity edema, 3+ left lower extremity edema.  Several superficial ulcerations on the left leg as well as dry scaling skin.  There is weeping from the wounds on the left leg. Neurologic: Sensation grossly intact in extremities.  Symmetrical.  Speech is fluent.  Psychiatric: Judgment intact, Mood & affect appropriate for pt's clinical situation. Dermatologic: Left leg wounds as described above       Labs No results found for this or any previous visit (from the past 2160 hour(s)).  Radiology No results found.  Assessment/Plan Hypercholesterolemia lipid control important in reducing the progression of atherosclerotic disease. Continue statin therapy   Benign essential hypertension blood pressure control important in reducing the progression of atherosclerotic disease. On appropriate oral medications.   Absent pulse in lower extremity  ABIs were performed previously which were 1.2 bilaterally with triphasic waveforms consistent with no arterial insufficiency.  Lack of pulses were likely secondary to significant swelling but he does not appear to  have worrisome arterial insufficiency.  Should have plenty of arterial perfusion to heal his wounds.  Lower limb ulcer, calf, left, limited to breakdown of skin (HCC) A 3 layer Unna boot was placed today and will be changed weekly.  Reassess in 3 to 4 weeks in the office.  Venous insufficiency of both lower extremities Noninvasive studies today demonstrate only reflux at the saphenofemoral junction on the right and deep venous reflux on the left.  No DVT or superficial thrombophlebitis.  With this finding, no intervention is likely to be of benefit.  Would recommend compression stockings and elevation on the right leg.  Would recommend an Unna boot on the left leg.  I will plan to wrap the leg in an Unna boot for several weeks and reassess this in 3 to 4 weeks with a visit in the office.  I discussed the importance of activity and leg elevation.  I discussed the pathophysiology and natural history of venous disease and why we do not treat deep venous insufficiency with procedures.    Leotis Pain, MD  12/27/2019 3:41 PM    This note was created with Dragon medical transcription system.  Any errors from dictation are purely unintentional

## 2019-12-27 NOTE — Assessment & Plan Note (Signed)
Noninvasive studies today demonstrate only reflux at the saphenofemoral junction on the right and deep venous reflux on the left.  No DVT or superficial thrombophlebitis.  With this finding, no intervention is likely to be of benefit.  Would recommend compression stockings and elevation on the right leg.  Would recommend an Unna boot on the left leg.  I will plan to wrap the leg in an Unna boot for several weeks and reassess this in 3 to 4 weeks with a visit in the office.  I discussed the importance of activity and leg elevation.  I discussed the pathophysiology and natural history of venous disease and why we do not treat deep venous insufficiency with procedures.

## 2019-12-27 NOTE — Patient Instructions (Signed)

## 2019-12-29 DIAGNOSIS — C672 Malignant neoplasm of lateral wall of bladder: Secondary | ICD-10-CM | POA: Diagnosis not present

## 2019-12-29 DIAGNOSIS — D414 Neoplasm of uncertain behavior of bladder: Secondary | ICD-10-CM | POA: Diagnosis not present

## 2019-12-29 DIAGNOSIS — Z8551 Personal history of malignant neoplasm of bladder: Secondary | ICD-10-CM | POA: Diagnosis not present

## 2020-01-04 ENCOUNTER — Ambulatory Visit (INDEPENDENT_AMBULATORY_CARE_PROVIDER_SITE_OTHER): Payer: Medicare Other | Admitting: Nurse Practitioner

## 2020-01-04 ENCOUNTER — Other Ambulatory Visit: Payer: Self-pay

## 2020-01-04 VITALS — BP 118/71 | HR 69 | Ht 70.0 in | Wt 209.0 lb

## 2020-01-04 DIAGNOSIS — L97221 Non-pressure chronic ulcer of left calf limited to breakdown of skin: Secondary | ICD-10-CM | POA: Diagnosis not present

## 2020-01-04 NOTE — Progress Notes (Signed)
History of Present Illness  There is no documented history at this time  Assessments & Plan   There are no diagnoses linked to this encounter.    Additional instructions  Subjective:  Patient presents with venous ulcer of the Right lower extremity.    Procedure:  3 layer unna wrap was placed Right lower extremity.   Plan:   Follow up in one week.   

## 2020-01-09 ENCOUNTER — Encounter (INDEPENDENT_AMBULATORY_CARE_PROVIDER_SITE_OTHER): Payer: Self-pay | Admitting: Nurse Practitioner

## 2020-01-11 ENCOUNTER — Encounter (INDEPENDENT_AMBULATORY_CARE_PROVIDER_SITE_OTHER): Payer: Self-pay

## 2020-01-11 ENCOUNTER — Ambulatory Visit (INDEPENDENT_AMBULATORY_CARE_PROVIDER_SITE_OTHER): Payer: Medicare Other | Admitting: Nurse Practitioner

## 2020-01-11 ENCOUNTER — Other Ambulatory Visit: Payer: Self-pay

## 2020-01-11 VITALS — BP 102/56 | HR 70 | Resp 16 | Wt 211.8 lb

## 2020-01-11 DIAGNOSIS — L97221 Non-pressure chronic ulcer of left calf limited to breakdown of skin: Secondary | ICD-10-CM | POA: Diagnosis not present

## 2020-01-11 NOTE — Progress Notes (Signed)
History of Present Illness  There is no documented history at this time  Assessments & Plan   There are no diagnoses linked to this encounter.    Additional instructions  Subjective:  Patient presents with venous ulcer of the Left lower extremity.    Procedure:  3 layer unna wrap was placed Left lower extremity.   Plan:   Follow up in one week.  

## 2020-01-13 ENCOUNTER — Encounter: Payer: Self-pay | Admitting: Emergency Medicine

## 2020-01-13 ENCOUNTER — Other Ambulatory Visit: Payer: Self-pay

## 2020-01-13 ENCOUNTER — Telehealth: Payer: Self-pay | Admitting: Internal Medicine

## 2020-01-13 ENCOUNTER — Emergency Department: Payer: Medicare Other

## 2020-01-13 ENCOUNTER — Inpatient Hospital Stay
Admission: EM | Admit: 2020-01-13 | Discharge: 2020-01-15 | DRG: 291 | Disposition: A | Discharge status: Home / Self Care | Payer: Medicare Other | Attending: Hospitalist | Admitting: Hospitalist

## 2020-01-13 DIAGNOSIS — R0602 Shortness of breath: Secondary | ICD-10-CM

## 2020-01-13 DIAGNOSIS — G7000 Myasthenia gravis without (acute) exacerbation: Secondary | ICD-10-CM | POA: Diagnosis not present

## 2020-01-13 DIAGNOSIS — N400 Enlarged prostate without lower urinary tract symptoms: Secondary | ICD-10-CM | POA: Diagnosis present

## 2020-01-13 DIAGNOSIS — Z8042 Family history of malignant neoplasm of prostate: Secondary | ICD-10-CM

## 2020-01-13 DIAGNOSIS — I509 Heart failure, unspecified: Secondary | ICD-10-CM

## 2020-01-13 DIAGNOSIS — Z87891 Personal history of nicotine dependence: Secondary | ICD-10-CM

## 2020-01-13 DIAGNOSIS — Z882 Allergy status to sulfonamides status: Secondary | ICD-10-CM | POA: Diagnosis not present

## 2020-01-13 DIAGNOSIS — I248 Other forms of acute ischemic heart disease: Secondary | ICD-10-CM | POA: Diagnosis present

## 2020-01-13 DIAGNOSIS — R0600 Dyspnea, unspecified: Secondary | ICD-10-CM | POA: Diagnosis not present

## 2020-01-13 DIAGNOSIS — Z7982 Long term (current) use of aspirin: Secondary | ICD-10-CM | POA: Diagnosis not present

## 2020-01-13 DIAGNOSIS — G7080 Lambert-Eaton syndrome, unspecified: Secondary | ICD-10-CM | POA: Diagnosis present

## 2020-01-13 DIAGNOSIS — J9601 Acute respiratory failure with hypoxia: Secondary | ICD-10-CM | POA: Diagnosis present

## 2020-01-13 DIAGNOSIS — Z801 Family history of malignant neoplasm of trachea, bronchus and lung: Secondary | ICD-10-CM

## 2020-01-13 DIAGNOSIS — R0689 Other abnormalities of breathing: Secondary | ICD-10-CM | POA: Diagnosis not present

## 2020-01-13 DIAGNOSIS — R0902 Hypoxemia: Secondary | ICD-10-CM | POA: Diagnosis not present

## 2020-01-13 DIAGNOSIS — I2510 Atherosclerotic heart disease of native coronary artery without angina pectoris: Secondary | ICD-10-CM | POA: Diagnosis not present

## 2020-01-13 DIAGNOSIS — I5023 Acute on chronic systolic (congestive) heart failure: Secondary | ICD-10-CM | POA: Diagnosis not present

## 2020-01-13 DIAGNOSIS — I110 Hypertensive heart disease with heart failure: Principal | ICD-10-CM | POA: Diagnosis present

## 2020-01-13 DIAGNOSIS — Z79899 Other long term (current) drug therapy: Secondary | ICD-10-CM | POA: Diagnosis not present

## 2020-01-13 DIAGNOSIS — E785 Hyperlipidemia, unspecified: Secondary | ICD-10-CM | POA: Diagnosis present

## 2020-01-13 DIAGNOSIS — Z8551 Personal history of malignant neoplasm of bladder: Secondary | ICD-10-CM | POA: Diagnosis not present

## 2020-01-13 DIAGNOSIS — I10 Essential (primary) hypertension: Secondary | ICD-10-CM | POA: Diagnosis not present

## 2020-01-13 DIAGNOSIS — Z20822 Contact with and (suspected) exposure to covid-19: Secondary | ICD-10-CM | POA: Diagnosis not present

## 2020-01-13 LAB — BASIC METABOLIC PANEL
Anion gap: 7 (ref 5–15)
BUN: 16 mg/dL (ref 8–23)
CO2: 30 mmol/L (ref 22–32)
Calcium: 8.9 mg/dL (ref 8.9–10.3)
Chloride: 103 mmol/L (ref 98–111)
Creatinine, Ser: 1.06 mg/dL (ref 0.61–1.24)
GFR calc Af Amer: >60 mL/min (ref >60)
GFR calc non Af Amer: >60 mL/min (ref >60)
Glucose, Bld: 113 mg/dL — ABNORMAL HIGH (ref 70–99)
Potassium: 4.5 mmol/L (ref 3.5–5.1)
Sodium: 140 mmol/L (ref 135–145)

## 2020-01-13 LAB — CBC WITH DIFFERENTIAL/PLATELET
Abs Immature Granulocytes: 0.01 10*3/uL (ref 0.00–0.07)
Basophils Absolute: 0 10*3/uL (ref 0.0–0.1)
Basophils Relative: 1 %
Eosinophils Absolute: 0.1 10*3/uL (ref 0.0–0.5)
Eosinophils Relative: 2 %
HCT: 39.5 % (ref 39.0–52.0)
Hemoglobin: 12.2 g/dL — ABNORMAL LOW (ref 13.0–17.0)
Immature Granulocytes: 0 %
Lymphocytes Relative: 16 %
Lymphs Abs: 1 10*3/uL (ref 0.7–4.0)
MCH: 29.2 pg (ref 26.0–34.0)
MCHC: 30.9 g/dL (ref 30.0–36.0)
MCV: 94.5 fL (ref 80.0–100.0)
Monocytes Absolute: 0.5 10*3/uL (ref 0.1–1.0)
Monocytes Relative: 8 %
Neutro Abs: 4.6 10*3/uL (ref 1.7–7.7)
Neutrophils Relative %: 73 %
Platelets: 187 10*3/uL (ref 150–400)
RBC: 4.18 MIL/uL — ABNORMAL LOW (ref 4.22–5.81)
RDW: 14 % (ref 11.5–15.5)
WBC: 6.3 10*3/uL (ref 4.0–10.5)
nRBC: 0 % (ref 0.0–0.2)

## 2020-01-13 LAB — BRAIN NATRIURETIC PEPTIDE: B Natriuretic Peptide: 764 pg/mL — ABNORMAL HIGH (ref 0.0–100.0)

## 2020-01-13 LAB — TROPONIN I (HIGH SENSITIVITY)
Troponin I (High Sensitivity): 108 ng/L (ref <18)
Troponin I (High Sensitivity): 110 ng/L (ref <18)

## 2020-01-13 LAB — SARS CORONAVIRUS 2 (TAT 6-24 HRS): SARS Coronavirus 2: NEGATIVE

## 2020-01-13 MED ORDER — FUROSEMIDE 10 MG/ML IJ SOLN
40.0000 mg | Freq: Once | INTRAMUSCULAR | Status: AC
  Administered 2020-01-13: 40 mg via INTRAVENOUS
  Filled 2020-01-13: qty 4

## 2020-01-13 MED ORDER — ALUM & MAG HYDROXIDE-SIMETH 200-200-20 MG/5ML PO SUSP: 15.0000 mL | Freq: Four times a day (QID) | ORAL | Status: DC | PRN

## 2020-01-13 MED ORDER — PREDNISOLONE ACETATE 1 % OP SUSP
1.0000 [drp] | Freq: Every day | OPHTHALMIC | Status: DC
  Administered 2020-01-14 – 2020-01-15 (×2): 1 [drp] via OPHTHALMIC
  Filled 2020-01-13: qty 1

## 2020-01-13 MED ORDER — PRAVASTATIN SODIUM 20 MG PO TABS
20.0000 mg | ORAL_TABLET | Freq: Every day | ORAL | Status: DC
  Administered 2020-01-14 – 2020-01-15 (×2): 20 mg via ORAL
  Filled 2020-01-13 (×2): qty 1

## 2020-01-13 MED ORDER — ACETAMINOPHEN 500 MG PO TABS: 1000.0000 mg | ORAL_TABLET | Freq: Three times a day (TID) | ORAL | Status: DC | PRN

## 2020-01-13 MED ORDER — ONDANSETRON 4 MG PO TBDP: 4.0000 mg | ORAL_TABLET | Freq: Three times a day (TID) | ORAL | Status: DC | PRN

## 2020-01-13 MED ORDER — ONDANSETRON HCL 4 MG/2ML IJ SOLN: 4.0000 mg | Freq: Four times a day (QID) | INTRAMUSCULAR | Status: DC | PRN

## 2020-01-13 MED ORDER — TAMSULOSIN HCL 0.4 MG PO CAPS
0.4000 mg | ORAL_CAPSULE | Freq: Every day | ORAL | Status: DC
  Administered 2020-01-14 – 2020-01-15 (×2): 0.4 mg via ORAL
  Filled 2020-01-13 (×2): qty 1

## 2020-01-13 MED ORDER — PREDNISOLONE ACETATE 1 % OP SUSP
1.0000 [drp] | Freq: Four times a day (QID) | OPHTHALMIC | Status: DC
  Filled 2020-01-13: qty 1

## 2020-01-13 MED ORDER — ASPIRIN EC 81 MG PO TBEC
81.0000 mg | DELAYED_RELEASE_TABLET | Freq: Every day | ORAL | Status: DC
  Administered 2020-01-14 – 2020-01-15 (×2): 81 mg via ORAL
  Filled 2020-01-13 (×2): qty 1

## 2020-01-13 MED ORDER — POLYVINYL ALCOHOL 1.4 % OP SOLN
1.0000 [drp] | Freq: Every day | OPHTHALMIC | Status: DC
  Filled 2020-01-13: qty 15

## 2020-01-13 MED ORDER — CALCIUM CARBONATE ANTACID 500 MG PO CHEW: 1.0000 | CHEWABLE_TABLET | Freq: Three times a day (TID) | ORAL | Status: DC | PRN

## 2020-01-13 MED ORDER — FINASTERIDE 5 MG PO TABS
5.0000 mg | ORAL_TABLET | Freq: Every day | ORAL | Status: DC
  Administered 2020-01-14 – 2020-01-15 (×2): 5 mg via ORAL
  Filled 2020-01-13 (×2): qty 1

## 2020-01-13 MED ORDER — DOCUSATE SODIUM 100 MG PO CAPS: 100.0000 mg | ORAL_CAPSULE | Freq: Two times a day (BID) | ORAL | Status: DC | PRN

## 2020-01-13 MED ORDER — GUAIFENESIN-DM 100-10 MG/5ML PO SYRP
10.0000 mL | ORAL_SOLUTION | Freq: Four times a day (QID) | ORAL | Status: DC | PRN
  Filled 2020-01-13: qty 10

## 2020-01-13 MED ORDER — POLYVINYL ALCOHOL 1.4 % OP SOLN
1.0000 [drp] | OPHTHALMIC | Status: DC | PRN
  Filled 2020-01-13: qty 15

## 2020-01-13 MED ORDER — POLYETHYLENE GLYCOL 3350 17 G PO PACK: 17.0000 g | PACK | Freq: Two times a day (BID) | ORAL | Status: DC | PRN

## 2020-01-13 MED ORDER — ENOXAPARIN SODIUM 40 MG/0.4ML ~~LOC~~ SOLN
40.0000 mg | SUBCUTANEOUS | Status: DC
  Administered 2020-01-13 – 2020-01-14 (×2): 40 mg via SUBCUTANEOUS
  Filled 2020-01-13 (×2): qty 0.4

## 2020-01-13 MED ORDER — PYRIDOSTIGMINE BROMIDE 60 MG PO TABS
30.0000 mg | ORAL_TABLET | Freq: Every day | ORAL | Status: DC
  Administered 2020-01-13 – 2020-01-15 (×8): 30 mg via ORAL
  Filled 2020-01-13 (×14): qty 0.5

## 2020-01-13 NOTE — ED Provider Notes (Signed)
[facility] Emergency Department Provider Note   ____________________________________________   First MD Initiated Contact with Patient 01/13/20 1307     (approximate)  I have reviewed the triage vital signs and the nursing notes.   HISTORY  Chief Complaint Shortness of Breath    HPI [name] is a 86 y.o. male with possible history of CAD, CHF, hypertension, hyperlipidemia, Eaton-Lambert syndrome, and bladder cancer who presents to the ED for shortness of breath.  Patient reports that he has had increasing dyspnea on exertion for the past 3 days.  He reports being able to ambulate up to 1/2 mile without difficulty in the past, but can now only walk 10 or so steps until he has to stop and rest.  He has also noticed increased swelling in his legs with some weeping of fluid.  EMS was called to his house and found patient to be short of breath, placed on 2 L nasal cannula with for comfort.  He denies any recent fevers or chest pain, does endorse a nonproductive cough.  He states he has been intermittently compliant with his Lasix, has been trying to avoid salty foods.        Past Medical History:  Diagnosis Date  . Abnormal chest CT 03/28/2017  . Allergy   . Anemia   . Angina pectoris ([place]) 04/02/2011  . Aortic aneurysm ([place]) 03/28/2017   Saw Dr Genevive Bi 04/16/17 - recommended f/u CT in 3 months.    . Atherosclerosis of both carotid arteries 07/17/2014  . Benign lipomatous neoplasm of skin and subcutaneous tissue of left arm 10/04/2013  . Bilateral carotid artery stenosis 08/30/2014   Overview:  Less than 50% 2015  . Bladder cancer ([place]) 03/05/2013  . CAD (coronary artery disease) 06/18/2014  . Cancer ([place]) 11-24-12   bladder. BCG treatments by Dr Jacqlyn Larsen.  . Chicken pox   . Chronic prostatitis 09/30/2012  . Colon polyp   . Congestive heart failure ([place]) 04/20/2014   Overview:  Overview:  With anterior mi and moderate lv dyfunction ef 35%  . Dizziness 12/22/2016  .  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 Elevated prostate specific antigen (PSA) 09/30/2012  . Essential hypertension, benign   . Gross hematuria 09/30/2012  . Herpes zoster 11/28/2011   Overview:  with ocular involvement OD  . Hypercholesterolemia   . Hypertension, benign 04/02/2011  . Hypotension   . Moderate mitral insufficiency 05/18/2015  . Moderate tricuspid insufficiency 05/18/2015  . Myasthenia gravis ([place])   . Myasthenia gravis without exacerbation ([place]) 03/31/2011  . Shingles 2013    Patient Active Problem List   Diagnosis Date Noted  . Acute exacerbation of CHF (congestive heart failure) ([place]) 01/13/2020  . Lower limb ulcer, calf, left, limited to breakdown of skin ([place]) 12/27/2019  . Dysuria 06/30/2019  . Hyperglycemia 06/30/2019  . LVH (left ventricular hypertrophy) due to hypertensive disease, without heart failure 05/26/2019  . Pseudophakia, left eye 11/26/2018  . Venous insufficiency of both lower extremities 10/13/2018  . Swelling of limb 10/12/2018  . Bilateral lower extremity edema 10/10/2018  . Absent pulse in lower extremity 10/10/2018  . 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 Eye pain 05/31/2018  . Mild aortic valve stenosis 12/03/2017  . Epididymoorchitis 09/02/2017  . Acute cystitis with hematuria 08/11/2017  . Hydrocele, right 08/11/2017  . Aortic aneurysm ([place]) 03/28/2017  . Abnormal chest CT 03/28/2017  . Hypotension   . Dizziness 12/22/2016  .  Leg weakness 03/11/2016  . Skin lesion of cheek 09/11/2015  . Moderate mitral insufficiency 05/18/2015  . Moderate tricuspid insufficiency 05/18/2015  . Benign essential hypertension 04/25/2015  . Rash 03/10/2015  . Health care maintenance 03/10/2015  . Bilateral carotid artery stenosis 08/30/2014  . Atherosclerosis of both carotid arteries 07/17/2014  . Chronic systolic CHF (congestive heart failure), NYHA class 3 ([place]) 07/17/2014  . CAD (coronary artery  disease) 06/18/2014  . CHF (congestive heart failure) ([place]) 04/20/2014  . Congestive heart failure ([place]) 04/20/2014  . SOB (shortness of breath) 03/10/2014  . Abnormal EKG 03/10/2014  . Mixed hyperlipidemia 03/10/2014  . Benign lipomatous neoplasm of skin and subcutaneous tissue of left arm 10/04/2013  . Arm skin lesion, left 09/04/2013  . History of shingles 09/04/2013  . History of bladder cancer 03/05/2013  . Chronic prostatitis 09/30/2012  . Elevated prostate specific antigen (PSA) 09/30/2012  . Family history of malignant neoplasm of prostate 09/30/2012  . Gross hematuria 09/30/2012  . Incomplete emptying of bladder 09/30/2012  . Anemia 08/30/2012  . 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 Hypercholesterolemia 08/30/2012  . Herpes zoster 11/28/2011  . Hypertension, benign 04/02/2011  . Myasthenia gravis without exacerbation ([place]) 03/31/2011    Past Surgical History:  Procedure Laterality Date  . APPENDECTOMY  1958  . CHOLECYSTECTOMY  1979  . HERNIA REPAIR  1970  . TONSILLECTOMY  1958    Prior to Admission medications   Medication Sig Start Date End Date Taking? Authorizing Provider  acetaminophen (TYLENOL) 500 MG tablet Take 500 mg by mouth every 6 (six) hours as needed.    [provider]  aspirin 81 MG tablet Take 81 mg by mouth daily.    [provider]  finasteride (PROSCAR) 5 MG tablet Take 5 mg by mouth daily.    [provider]  furosemide (LASIX) 20 MG tablet Take 1 tablet (20 mg total) by mouth daily. 06/30/19   [name], MD  hydroxypropyl methylcellulose / hypromellose (ISOPTO TEARS / GONIOVISC) 2.5 % ophthalmic solution Place 1 drop into both eyes daily.    [provider]  isosorbide mononitrate (IMDUR) 30 MG 24 hr tablet Take 30 mg by mouth daily.    Corey Skains, MD  lisinopril (PRINIVIL,ZESTRIL) 5 MG tablet Take 5 mg by mouth daily.    [provider]  mycophenolate (CELLCEPT) 250 MG  capsule Take 1,000 mg by mouth 2 (two) times daily.     [provider]  pravastatin (PRAVACHOL) 20 MG tablet Take 1 tablet (20 mg total) by mouth daily. 07/27/14   [name], MD  prednisoLONE acetate (PRED FORTE) 1 % ophthalmic suspension Place 1 drop into the right eye 4 (four) times daily.    [provider]  Probiotic Product (ALIGN) 4 MG CAPS One capsule q day 06/30/19   [name], MD  pyridostigmine (MESTINON) 60 MG tablet Take 30 mg by mouth 6 (six) times daily.     [provider]  tamsulosin (FLOMAX) 0.4 MG CAPS Take 0.4 mg by mouth daily.    [provider]    Allergies Beta adrenergic blockers, Calcium channel blockers, Ciprofloxacin, and Sulfa antibiotics  Family History  Problem Relation Age of Onset  . Lung cancer Sister   . Prostate cancer Brother   . Lung cancer Brother   . Arthritis Other        parent  . Colon cancer Neg Hx     Social History Social History   Tobacco Use  .  Smoking status: Former Smoker    Quit date: 09/29/1958    Years since quitting: 61.3  . Smokeless tobacco: Never Used  Substance Use Topics  . Alcohol use: Yes    Alcohol/week: 0.0 standard drinks    Comment: occasionally  . Drug use: No    Review of Systems  Constitutional: No fever/chills Eyes: No visual changes. ENT: No sore throat. Cardiovascular: Denies chest pain. Respiratory: Positive for cough and shortness of breath. Gastrointestinal: No abdominal pain.  No nausea, no vomiting.  No diarrhea.  No constipation. Genitourinary: Negative for dysuria. Musculoskeletal: Negative for back pain.  Positive for leg swelling. Skin: Negative for rash. Neurological: Negative for headaches, focal weakness or numbness.  ____________________________________________   PHYSICAL EXAM:  VITAL SIGNS: ED Triage Vitals  Enc Vitals Group     BP      Pulse      Resp      Temp      Temp src      SpO2      Weight      Height      Head  Circumference      Peak Flow      Pain Score      Pain Loc      Pain Edu?      Excl. in [place]?     Constitutional: Alert and oriented. Eyes: Conjunctivae are normal. Head: Atraumatic. Nose: No congestion/rhinnorhea. Mouth/Throat: Mucous membranes are moist. Neck: Normal ROM Cardiovascular: Normal rate, regular rhythm. Grossly normal heart sounds. Respiratory: Normal respiratory effort.  No retractions.  Crackles to bilateral bases. Gastrointestinal: Soft and nontender. No distention. Genitourinary: deferred Musculoskeletal: No lower extremity tenderness.  2+ pitting edema to bilateral lower extremities. Neurologic:  Normal speech and language. No gross focal neurologic deficits are appreciated. Skin:  Skin is warm, dry and intact. No rash noted. Psychiatric: Mood and affect are normal. Speech and behavior are normal.  ____________________________________________   LABS (all labs ordered are listed, but only abnormal results are displayed)  Labs Reviewed  BASIC METABOLIC PANEL - Abnormal; Notable for the following components:      Result Value   Glucose, Bld 113 (*)    All other components within normal limits  CBC WITH DIFFERENTIAL/PLATELET - Abnormal; Notable for the following components:   RBC 4.18 (*)    Hemoglobin 12.2 (*)    All other components within normal limits  BRAIN NATRIURETIC PEPTIDE - Abnormal; Notable for the following components:   B Natriuretic Peptide 764.0 (*)    All other components within normal limits  TROPONIN I (HIGH SENSITIVITY) - Abnormal; Notable for the following components:   Troponin I (High Sensitivity) 108 (*)    All other components within normal limits  SARS CORONAVIRUS 2 (TAT 6-24 HRS)  TROPONIN I (HIGH SENSITIVITY)   ____________________________________________  EKG  ED ECG REPORT I, Blake Divine, the attending physician, personally viewed and interpreted this ECG.   Date: 01/13/2020  EKG Time: 13:17  Rate: 68  Rhythm: normal  sinus rhythm  Axis: Normal  Intervals:none  ST&T Change: Lateral T wave inversions   PROCEDURES  Procedure(s) performed (including Critical Care):  Procedures   ____________________________________________   INITIAL IMPRESSION / ASSESSMENT AND PLAN / ED COURSE       86 year old male with history of CAD, CHF, hypertension, bladder cancer, and Eaton-Lambert syndrome presents to the ED for increasing dyspnea on exertion over the past 3 days with lower extremity swelling.  He clinically appears fluid overloaded and  has crackles at bilateral bases, I suspect his symptoms are related to CHF exacerbation.  EKG does show lateral T wave inversions but patient denies any chest pain, we will screen troponin.  Plan to check chest x-ray and basic labs, he will likely benefit from diuresis.  Chest x-ray shows evidence of pulmonary vascular congestion, troponin elevated to just above 100, however patient denies any chest pain and I suspect this is due to CHF.  He was ambulated on room air and dropped O2 sats to 79%, subsequently improved with 2 L of supplemental oxygen.  Lasix was ordered for diuresis and case discussed with hospitalist for admission.      ____________________________________________   FINAL CLINICAL IMPRESSION(S) / ED DIAGNOSES  Final diagnoses:  Acute on chronic congestive heart failure, unspecified heart failure type ([place])  Shortness of breath     ED Discharge Orders    None       Note:  This document was prepared using Dragon voice recognition software and may include unintentional dictation errors.   Blake Divine, MD 01/13/20 1536

## 2020-01-13 NOTE — Telephone Encounter (Signed)
Pt called in and wanted to talk to Dr. Nicki Reaper. He is having shortness of breath and no energy. He said he took his oxygen yesterday and it was 93. Transferred to Access nurse.

## 2020-01-13 NOTE — Telephone Encounter (Signed)
EMS is in the home with the patient in route to [facility].

## 2020-01-13 NOTE — ED Notes (Signed)
Pt ambulated around the room. Pt breathing became labored and O2 sat decreased to 79% on RA. Pt placed on 2L [place] and O2 sat increased 97%. Dr. Charna Archer, MD made aware.

## 2020-01-13 NOTE — H&P (Signed)
History and Physical    [name] [id number] DOB: Oct 30, 1925 DOA: 01/13/2020  PCP: [name], MD  Patient coming from: home  I have personally briefly reviewed patient's old medical records in [place]  Chief Complaint: short of breath  HPI: [name] is a 86 y.o. Caucasian male with medical history significant of CAD, chronic systolic CHF (EF [id number] in Feb 2020), bilateral carotid stenosis, HTN, Myasthenia gravis, bladder cancer who presented with dyspnea.  Pt reported feeling more short of breath for the past several days.  Pt has chronic BLE swelling, L>R, which he said has not worsened, and he goes to clinic once a week to get wrapped.  Pt had no other complaints.  No fever, cough, chest pain, abdominal pain, N/V/D, dysuria, change in appetite, urination or bowel movements.  Pt has a prescription of Lasix 20 mg daily for his chronic LE edema that he only takes about half of the time and does urinate more when he takes it.  Pt has been eating out more recently.  At baseline, pt lives at home by himself and walks without assistance.  ED Course: initial vitals: afebrile, pulse 70, BP 134/76, sating 94% on room air.  When pt ambulated around the room, "breathing became labored and O2 sat decreased to 79% on RA. Pt placed on 2L [place] and O2 sat increased 97%."  Labs notable for normal WBC, BNP 764, trop 108.  EKG sinus without ACS-related changes.  CXR showed "Mildly prominent bilateral interstitial markings, which could reflect mild edema or atypical infection."  Pt received IV lasix 40 mg in the ED before admission.   Assessment/Plan Active Problems:   Acute exacerbation of CHF (congestive heart failure) (HCC)  # Acute hypoxic respiratory failure 2/2 # Acute on chronic systolic CHF exacerbation --Desat to 79% on RA, and needed 2 L on presentation.  BNP 764, CXR didn't show obvious pulm edema, however, exam revealed significant crackles.  Last TTE in Feb 202 showed EF  40%. --received IV lasix 40 mg in the ED PLAN: --Suppl O2 to maintain sat >=92%, and wean as able --continue IV diuresis based on response --TTE --Strict I/O and daily standing weight --Low-salt diet  # Mild trop elevation due to demand ischemia --trop 100's and flat.  No chest pain or ACS-related changes on EKG.  # Severe chronic BLE edema --diuresis as above --continue compression wrapping  # Myasthenia gravis --continue home pyridostigmine  # Hx of elevated PSA and BPH --continue home finasteride and Flomax  # HTN --Hold home Imdur and Lisinopril since BP soft and needing to be on IV lasix  # Hx of CAD --continue home ASA 81 and statin   DVT prophylaxis: Lovenox SQ Code Status: Full code  Family Communication: Son updated at the bedsides today  Disposition Plan: Home   Consults called: none Admission status: Inpatient   Review of Systems: As per HPI otherwise 10 point review of systems negative.   Past Medical History:  Diagnosis Date  . Abnormal chest CT 03/28/2017  . Allergy   . Anemia   . Angina pectoris ([place]) 04/02/2011  . Aortic aneurysm ([place]) 03/28/2017   Saw Dr Genevive Bi 04/16/17 - recommended f/u CT in 3 months.    . Atherosclerosis of both carotid arteries 07/17/2014  . Benign lipomatous neoplasm of skin and subcutaneous tissue of left arm 10/04/2013  . Bilateral carotid artery stenosis 08/30/2014   Overview:  Less than 50% 2015  . Bladder cancer ([place]) 03/05/2013  .  CAD (coronary artery disease) 06/18/2014  . Cancer ([place]) 11-24-12   bladder. BCG treatments by Dr Jacqlyn Larsen.  . Chicken pox   . Chronic prostatitis 09/30/2012  . Colon polyp   . Congestive heart failure ([place]) 04/20/2014   Overview:  Overview:  With anterior mi and moderate lv dyfunction ef 35%  . Dizziness 12/22/2016  . 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 Elevated prostate specific antigen (PSA) 09/30/2012  . Essential hypertension, benign   . Gross  hematuria 09/30/2012  . Herpes zoster 11/28/2011   Overview:  with ocular involvement OD  . Hypercholesterolemia   . Hypertension, benign 04/02/2011  . Hypotension   . Moderate mitral insufficiency 05/18/2015  . Moderate tricuspid insufficiency 05/18/2015  . Myasthenia gravis ([place])   . Myasthenia gravis without exacerbation ([place]) 03/31/2011  . Shingles 2013    Past Surgical History:  Procedure Laterality Date  . APPENDECTOMY  1958  . CHOLECYSTECTOMY  1979  . HERNIA REPAIR  1970  . TONSILLECTOMY  1958     reports that he quit smoking about 61 years ago. He has never used smokeless tobacco. He reports current alcohol use. He reports that he does not use drugs.  Allergies  Allergen Reactions  . Beta Adrenergic Blockers Other (See Comments)    Beta Blocker will precipitate acute weakness in Lambert-Eaton Syndrome or Myasthenia Gravis  . Calcium Channel Blockers Other (See Comments)    Calcium Channel Blocker- will precipitate acute weakness in Lambert-Eaton Syndrome.  . Ciprofloxacin Other (See Comments)    Last resort due to Myasthia Gravis  . Sulfa Antibiotics Rash    Family History  Problem Relation Age of Onset  . Lung cancer Sister   . Prostate cancer Brother   . Lung cancer Brother   . Arthritis Other        parent  . Colon cancer Neg Hx      Prior to Admission medications   Medication Sig Start Date End Date Taking? Authorizing Provider  acetaminophen (TYLENOL) 500 MG tablet Take 500 mg by mouth every 6 (six) hours as needed.    [provider]  aspirin 81 MG tablet Take 81 mg by mouth daily.    [provider]  finasteride (PROSCAR) 5 MG tablet Take 5 mg by mouth daily.    [provider]  furosemide (LASIX) 20 MG tablet Take 1 tablet (20 mg total) by mouth daily. 06/30/19   [name], MD  hydroxypropyl methylcellulose / hypromellose (ISOPTO TEARS / GONIOVISC) 2.5 % ophthalmic solution Place 1 drop into both eyes daily.    [provider]  isosorbide mononitrate (IMDUR) 30 MG 24 hr tablet Take 30 mg by mouth daily.    Corey Skains, MD  lisinopril (PRINIVIL,ZESTRIL) 5 MG tablet Take 5 mg by mouth daily.    [provider]  mycophenolate (CELLCEPT) 250 MG capsule Take 1,000 mg by mouth 2 (two) times daily.     [provider]  pravastatin (PRAVACHOL) 20 MG tablet Take 1 tablet (20 mg total) by mouth daily. 07/27/14   [name], MD  prednisoLONE acetate (PRED FORTE) 1 % ophthalmic suspension Place 1 drop into the right eye 4 (four) times daily.    [provider]  Probiotic Product (ALIGN) 4 MG CAPS One capsule q day 06/30/19   [name], MD  pyridostigmine (MESTINON) 60 MG tablet Take 30 mg by mouth 6 (six) times daily.     [provider]  tamsulosin (FLOMAX) 0.4 MG CAPS Take 0.4 mg by mouth daily.    [provider]    Physical Exam: Vitals:   01/13/20 1530 01/13/20 1600 01/13/20 1713 01/13/20 1726  BP: (!) 142/75 132/70 (!) 155/93   Pulse: 71 64 79   Resp: 17 20 (!) 25   Temp:   97.8 F (36.6 C)   TempSrc:   Oral   SpO2: 98% 98% 94%   Weight:    93.8 kg  Height:        Constitutional: NAD, AAOx3 HEENT: conjunctivae and lids normal, EOMI CV: RRR no M,R,G. Distal pulses +2.  No cyanosis.   RESP: crackles over mid to low posterior lung fields, normal respiratory effort, on 2L GI: +BS, NTND Extremities: Firm lumpy edema RLE.  LLE wrapped in ACE wrap. SKIN: warm, dry  Neuro: II - XII grossly intact.  Sensation intact Psych: Normal mood and affect.  Appropriate judgement and reason   Labs on Admission: I have personally reviewed following labs and imaging studies  CBC: Recent Labs  Lab 01/13/20 1315  WBC 6.3  NEUTROABS 4.6  HGB 12.2*  HCT 39.5  MCV 94.5  PLT [id number]   Basic Metabolic Panel: Recent Labs  Lab 01/13/20 1315  NA 140  K 4.5  CL 103  CO2 30  GLUCOSE 113*  BUN 16  CREATININE 1.06  CALCIUM 8.9   GFR: Estimated  Creatinine Clearance: 52.2 mL/min (by C-G formula based on SCr of 1.06 mg/dL). Liver Function Tests: No results for input(s): AST, ALT, ALKPHOS, BILITOT, PROT, ALBUMIN in the last 168 hours. No results for input(s): LIPASE, AMYLASE in the last 168 hours. No results for input(s): AMMONIA in the last 168 hours. Coagulation Profile: No results for input(s): INR, PROTIME in the last 168 hours. Cardiac Enzymes: No results for input(s): CKTOTAL, CKMB, CKMBINDEX, TROPONINI in the last 168 hours. BNP (last 3 results) No results for input(s): PROBNP in the last 8760 hours. HbA1C: No results for input(s): HGBA1C in the last 72 hours. CBG: No results for input(s): GLUCAP in the last 168 hours. Lipid Profile: No results for input(s): CHOL, HDL, LDLCALC, TRIG, CHOLHDL, LDLDIRECT in the last 72 hours. Thyroid Function Tests: No results for input(s): TSH, T4TOTAL, FREET4, T3FREE, THYROIDAB in the last 72 hours. Anemia Panel: No results for input(s): VITAMINB12, FOLATE, FERRITIN, TIBC, IRON, RETICCTPCT in the last 72 hours. Urine analysis:    Component Value Date/Time   COLORURINE YELLOW 06/30/2019 1022   APPEARANCEUR [place] Cloud[place] (A) 06/30/2019 1022   LABSPEC 1.020 06/30/2019 1022   PHURINE 5.5 06/30/2019 1022   GLUCOSEU NEGATIVE 06/30/2019 1022   HGBUR TRACE-LYSED (A) 06/30/2019 1022   BILIRUBINUR NEGATIVE 06/30/2019 1022   BILIRUBINUR n 08/30/2012 1006   KETONESUR NEGATIVE 06/30/2019 1022   PROTEINUR 30 (A) 08/10/2017 1439   UROBILINOGEN 0.2 06/30/2019 1022   NITRITE NEGATIVE 06/30/2019 1022   LEUKOCYTESUR SMALL (A) 06/30/2019 1022    Radiological Exams on Admission: DG Chest 2 View  Result Date: 01/13/2020 CLINICAL DATA:  Shortness of breath EXAM: CHEST - 2 VIEW COMPARISON:  08/15/2019 FINDINGS: Stable cardiomediastinal contours. Atherosclerotic calcification of the aortic knob. Low lung volumes. Mildly prominent interstitial markings bilaterally. No focal airspace consolidation. No  pleural effusion or pneumothorax. IMPRESSION: Mildly prominent bilateral interstitial markings, which could reflect mild edema or atypical infection. Electronically Signed   By: Davina Poke D.O.   On: 01/13/2020 14:06      Enzo Bi MD [facility]  If  7PM-7AM, please contact night-coverage 01/13/2020, 6:56 PM

## 2020-01-13 NOTE — ED Triage Notes (Signed)
Pt via EMS from home pt c/o SOB on exertion for the last 3 days. Pt has a hx of CHF and says he occasionally misses his fluid pill. EMS reported 92% on RA and place pt on 2L [place] that increased sat to 98%. On arrival, pt 94% on RA. Denies pain. Dr. Charna Archer at bedside. Pt A&Ox4 and NAD while sitting still.

## 2020-01-13 NOTE — ED Notes (Signed)
Date and time results received: 01/13/20 1404  Test: Troponin Critical Value: 108  Name of Provider Notified: Dr. Charna Archer, MD

## 2020-01-14 LAB — CBC
HCT: 40.2 % (ref 39.0–52.0)
Hemoglobin: 12 g/dL — ABNORMAL LOW (ref 13.0–17.0)
MCH: 28.7 pg (ref 26.0–34.0)
MCHC: 29.9 g/dL — ABNORMAL LOW (ref 30.0–36.0)
MCV: 96.2 fL (ref 80.0–100.0)
Platelets: 209 10*3/uL (ref 150–400)
RBC: 4.18 MIL/uL — ABNORMAL LOW (ref 4.22–5.81)
RDW: 14 % (ref 11.5–15.5)
WBC: 5.9 10*3/uL (ref 4.0–10.5)
nRBC: 0 % (ref 0.0–0.2)

## 2020-01-14 LAB — BASIC METABOLIC PANEL
Anion gap: 10 (ref 5–15)
BUN: 17 mg/dL (ref 8–23)
CO2: 31 mmol/L (ref 22–32)
Calcium: 8.7 mg/dL — ABNORMAL LOW (ref 8.9–10.3)
Chloride: 101 mmol/L (ref 98–111)
Creatinine, Ser: 1.09 mg/dL (ref 0.61–1.24)
GFR calc Af Amer: >60 mL/min (ref >60)
GFR calc non Af Amer: 59 mL/min — ABNORMAL LOW (ref >60)
Glucose, Bld: 108 mg/dL — ABNORMAL HIGH (ref 70–99)
Potassium: 4.4 mmol/L (ref 3.5–5.1)
Sodium: 142 mmol/L (ref 135–145)

## 2020-01-14 LAB — MAGNESIUM: Magnesium: 2.2 mg/dL (ref 1.7–2.4)

## 2020-01-14 MED ORDER — FUROSEMIDE 10 MG/ML IJ SOLN
40.0000 mg | Freq: Once | INTRAMUSCULAR | Status: AC
  Administered 2020-01-14: 40 mg via INTRAVENOUS
  Filled 2020-01-14: qty 4

## 2020-01-14 NOTE — Progress Notes (Signed)
PROGRESS NOTE    [name]  [id number] DOB: [birth year] DOA: 01/13/2020 PCP: [name], MD    Assessment & Plan:   Active Problems:   Acute exacerbation of CHF (congestive heart failure) ([place])    [name] is a 86 y.o. Caucasian male with medical history significant of CAD, chronic systolic CHF (EF [id number] in Feb 2020), bilateral carotid stenosis, HTN, Myasthenia gravis, bladder cancer who presented with dyspnea.   # Acute hypoxic respiratory failure 2/2 # Acute on chronic systolic CHF exacerbation --Desat to 79% on RA, and needed 2 L on presentation.  BNP 764, CXR didn't show obvious pulm edema, however, exam revealed significant crackles.  Last TTE in Feb 202 showed EF 40%.   --received IV lasix 40 mg in the ED PLAN: --Suppl O2 to maintain sat >=92%, and wean as able (down to 1L) --continue IV diuresis 40 mg daily --TTE --Strict I/O and daily standing weight --Low-salt diet  # Mild trop elevation due to demand ischemia --trop 100's and flat.  No chest pain or ACS-related changes on EKG.  # Severe chronic BLE edema --diuresis as above --continue compression wrapping  # Myasthenia gravis --continue home pyridostigmine  # Hx of elevated PSA and BPH --continue home finasteride and Flomax  # HTN --Hold home Imdur and Lisinopril since BP soft and needing to be on IV lasix  # Hx of CAD --continue home ASA 81 and statin   DVT prophylaxis: Lovenox SQ Code Status: Full code  Family Communication: Daughter updated at bedside today Disposition Plan: Home tomorrow if weaned down to RA.   Subjective and Interval History:  [name] reported breathing better.  Good urine output.  No fever, chest pain, abdominal pain, N/V/D, dysuria, increased swelling.   Objective: Vitals:   01/13/20 2025 01/14/20 0547 01/14/20 0950 01/14/20 1308  BP: 115/63 114/68  (!) 100/52  Pulse: 71 70  70  Resp: 20 16  18   Temp: 98 F (36.7 C) 98.4 F (36.9 C)  98.4 F (36.9  C)  TempSrc: Oral Oral    SpO2: 97% 94% 94% 99%  Weight:   92.2 kg   Height:   5\' 10"  (1.778 m)     Intake/Output Summary (Last 24 hours) at 01/14/2020 1734 Last data filed at 01/14/2020 1309 Gross per 24 hour  Intake 240 ml  Output 3450 ml  Net -3210 ml   Filed Weights   01/13/20 1310 01/13/20 1726 01/14/20 0950  Weight: 93 kg 93.8 kg 92.2 kg    Examination:   Constitutional: NAD, AAOx3 HEENT: conjunctivae and lids normal, EOMI CV: RRR no M,R,G. Distal pulses +2.  No cyanosis.   RESP: crackles over mid to low posterior lung fields, normal respiratory effort, on 1L GI: +BS, NTND Extremities: Firm lumpy edema RLE.  LLE wrapped in ACE wrap. SKIN: warm, dry  Neuro: II - XII grossly intact.  Sensation intact Psych: Normal mood and affect.  Appropriate judgement and reason   Data Reviewed: I have personally reviewed following labs and imaging studies  CBC: Recent Labs  Lab 01/13/20 1315 01/14/20 0336  WBC 6.3 5.9  NEUTROABS 4.6  --   HGB 12.2* 12.0*  HCT 39.5 40.2  MCV 94.5 96.2  PLT 187 [id number]   Basic Metabolic Panel: Recent Labs  Lab 01/13/20 1315 01/14/20 0336  NA 140 142  K 4.5 4.4  CL 103 101  CO2 30 31  GLUCOSE 113* 108*  BUN 16 17  CREATININE 1.06 1.09  CALCIUM  8.9 8.7*  MG  --  2.2   GFR: Estimated Creatinine Clearance: 50.4 mL/min (by C-G formula based on SCr of 1.09 mg/dL). Liver Function Tests: No results for input(s): AST, ALT, ALKPHOS, BILITOT, PROT, ALBUMIN in the last 168 hours. No results for input(s): LIPASE, AMYLASE in the last 168 hours. No results for input(s): AMMONIA in the last 168 hours. Coagulation Profile: No results for input(s): INR, PROTIME in the last 168 hours. Cardiac Enzymes: No results for input(s): CKTOTAL, CKMB, CKMBINDEX, TROPONINI in the last 168 hours. BNP (last 3 results) No results for input(s): PROBNP in the last 8760 hours. HbA1C: No results for input(s): HGBA1C in the last 72 hours. CBG: No results for  input(s): GLUCAP in the last 168 hours. Lipid Profile: No results for input(s): CHOL, HDL, LDLCALC, TRIG, CHOLHDL, LDLDIRECT in the last 72 hours. Thyroid Function Tests: No results for input(s): TSH, T4TOTAL, FREET4, T3FREE, THYROIDAB in the last 72 hours. Anemia Panel: No results for input(s): VITAMINB12, FOLATE, FERRITIN, TIBC, IRON, RETICCTPCT in the last 72 hours. Sepsis Labs: No results for input(s): PROCALCITON, LATICACIDVEN in the last 168 hours.  Recent Results (from the past 240 hour(s))  SARS CORONAVIRUS 2 (TAT 6-24 HRS) Nasopharyngeal Nasopharyngeal Swab     Status: None   Collection Time: 01/13/20  4:35 PM   Specimen: Nasopharyngeal Swab  Result Value Ref Range Status   SARS Coronavirus 2 NEGATIVE NEGATIVE Final    Comment: (NOTE) SARS-CoV-2 target nucleic acids are NOT DETECTED. The SARS-CoV-2 RNA is generally detectable in upper and lower respiratory specimens during the acute phase of infection. Negative results do not preclude SARS-CoV-2 infection, do not rule out co-infections with other pathogens, and should not be used as the sole basis for treatment or other patient management decisions. Negative results must be combined with clinical observations, patient history, and epidemiological information. The expected result is Negative. Fact Sheet for Patients: SugarRoll.be Fact Sheet for Healthcare Providers: https://www.woods-mathews.com/ This test is not yet approved or cleared by the [place] FDA and  has been authorized for detection and/or diagnosis of SARS-CoV-2 by FDA under an Emergency Use Authorization (EUA). This EUA will remain  in effect (meaning this test can be used) for the duration of the COVID-19 declaration under Section 56 4(b)(1) of the Act, 21 U.S.C. section 360bbb-3(b)(1), unless the authorization is terminated or revoked sooner. Performed at [facility], [place] [address] [place], [place]  [id number]       Radiology Studies: DG Chest 2 View  Result Date: 01/13/2020 CLINICAL DATA:  Shortness of breath EXAM: CHEST - 2 VIEW COMPARISON:  08/15/2019 FINDINGS: Stable cardiomediastinal contours. Atherosclerotic calcification of the aortic knob. Low lung volumes. Mildly prominent interstitial markings bilaterally. No focal airspace consolidation. No pleural effusion or pneumothorax. IMPRESSION: Mildly prominent bilateral interstitial markings, which could reflect mild edema or atypical infection. Electronically Signed   By: Davina Poke D.O.   On: 01/13/2020 14:06     Scheduled Meds: . aspirin EC  81 mg Oral Daily  . enoxaparin (LOVENOX) injection  40 mg Subcutaneous Q24H  . finasteride  5 mg Oral Daily  . pravastatin  20 mg Oral Daily  . prednisoLONE acetate  1 drop Right Eye Daily  . pyridostigmine  30 mg Oral 6 X Daily  . tamsulosin  0.4 mg Oral Daily   Continuous Infusions:   LOS: 1 day     Enzo Bi, MD [facility] If 7PM-7AM, please contact night-coverage 01/14/2020, 5:34 PM

## 2020-01-14 NOTE — Progress Notes (Signed)
Patient's O2 Sats at rest on 1L [place] 98%. Ambulated with patient 480 feet. Patient began to desaturate to 90% on 1L and began to feel weak. Increased O2 to 2L and saturations increased to 94%. Patient returned to room and tolerated ambulation well. Patient was placed back down to 1L [place] at saturations were holding at 94%.   Fuller Mandril, RN

## 2020-01-14 NOTE — Progress Notes (Signed)
Weaned patient's O2 down to 1L. Patient O2 Saturations at 94%.   Fuller Mandril, RN

## 2020-01-15 ENCOUNTER — Inpatient Hospital Stay
Admit: 2020-01-15 | Discharge: 2020-01-15 | Disposition: A | Discharge status: Home / Self Care | Payer: Medicare Other | Attending: Hospitalist | Admitting: Hospitalist

## 2020-01-15 LAB — CBC
HCT: 37.4 % — ABNORMAL LOW (ref 39.0–52.0)
Hemoglobin: 11.7 g/dL — ABNORMAL LOW (ref 13.0–17.0)
MCH: 29.2 pg (ref 26.0–34.0)
MCHC: 31.3 g/dL (ref 30.0–36.0)
MCV: 93.3 fL (ref 80.0–100.0)
Platelets: 175 10*3/uL (ref 150–400)
RBC: 4.01 MIL/uL — ABNORMAL LOW (ref 4.22–5.81)
RDW: 13.8 % (ref 11.5–15.5)
WBC: 4.9 10*3/uL (ref 4.0–10.5)
nRBC: 0 % (ref 0.0–0.2)

## 2020-01-15 LAB — BASIC METABOLIC PANEL
Anion gap: 8 (ref 5–15)
BUN: 24 mg/dL — ABNORMAL HIGH (ref 8–23)
CO2: 32 mmol/L (ref 22–32)
Calcium: 8.5 mg/dL — ABNORMAL LOW (ref 8.9–10.3)
Chloride: 99 mmol/L (ref 98–111)
Creatinine, Ser: 1.1 mg/dL (ref 0.61–1.24)
GFR calc Af Amer: >60 mL/min (ref >60)
GFR calc non Af Amer: 58 mL/min — ABNORMAL LOW (ref >60)
Glucose, Bld: 101 mg/dL — ABNORMAL HIGH (ref 70–99)
Potassium: 4.2 mmol/L (ref 3.5–5.1)
Sodium: 139 mmol/L (ref 135–145)

## 2020-01-15 LAB — MAGNESIUM: Magnesium: 2.3 mg/dL (ref 1.7–2.4)

## 2020-01-15 MED ORDER — LISINOPRIL 5 MG PO TABS: ORAL_TABLET | ORAL | Status: DC

## 2020-01-15 MED ORDER — PERFLUTREN LIPID MICROSPHERE
1.0000 mL | INTRAVENOUS | Status: AC | PRN
  Administered 2020-01-15: 4 mL via INTRAVENOUS
  Filled 2020-01-15: qty 10

## 2020-01-15 MED ORDER — FUROSEMIDE 20 MG PO TABS: 20.0000 mg | ORAL_TABLET | Freq: Every day | ORAL | 2 refills | Status: DC

## 2020-01-15 MED ORDER — FUROSEMIDE 10 MG/ML IJ SOLN
40.0000 mg | Freq: Once | INTRAMUSCULAR | Status: AC
  Administered 2020-01-15: 40 mg via INTRAVENOUS
  Filled 2020-01-15: qty 4

## 2020-01-15 MED ORDER — ISOSORBIDE MONONITRATE ER 30 MG PO TB24: ORAL_TABLET | ORAL | Status: DC

## 2020-01-15 MED ORDER — PREDNISOLONE ACETATE 1 % OP SUSP: 1.0000 [drp] | Freq: Every day | OPHTHALMIC | 0 refills | Status: DC

## 2020-01-15 MED ORDER — FUROSEMIDE 40 MG PO TABS: 40.0000 mg | ORAL_TABLET | Freq: Every day | ORAL | 2 refills | Status: DC

## 2020-01-15 NOTE — Discharge Summary (Addendum)
Physician Discharge Summary   [name]  male DOB: 1925-10-30  [id number]  PCP: [name], MD  Admit date: 01/13/2020 Discharge date: 01/15/2020  Admitted From: home Disposition:  Home Daughter updated on discharge plans prior to discharge. CODE STATUS: Full code  Discharge Instructions    Diet - low sodium heart healthy   Complete by: As directed    Discharge instructions   Complete by: As directed    You have received IV lasix to get extra fluid out of your lungs with improvement of your breathing.    I have increased your home oral Lasix to 40 mg daily.  And as we talked about, monitor your weight every morning, and if you have >3 lbs gain in 1-2 days, then call your doctor, or take an extra Lasix 40 mg in the afternoon.    Since we are increasing your Lasix, please have your outpatient doctor (either PCP or cardiologist) check your kidney function about 1 week after discharge.  Your creatinine on the day of discharge is 1.1, which is normal.   Because your blood pressure was on the low side in the hospital, systolic in [id number], I am holding your Imdur and Lisinopril until you follow up with your outpatient doctor.   Dr. Enzo Bi - -   Increase activity slowly   Complete by: As directed        Hospital Course:  For full details, please see H&P, progress notes, consult notes and ancillary notes.  Briefly,  [name] a 86 y.o.Caucasianmalewith medical history significant ofCAD, chronic systolic CHF (EF [id number] in Feb 2020), bilateral carotid stenosis, HTN, Myasthenia gravis, bladder cancer who presented with dyspnea.   # Acute hypoxic respiratory failure 2/2 # Acute on chronic systolic CHF exacerbation Desat to79% on RA, and needed 2 L on presentation. BNP 764, CXR didn't show obvious pulm edema, however, exam revealed significant crackles. Last TTE in Feb 202 showed EF 40%.  Pt has been on oral Lasix 20 mg daily for the past 5 years, and his  recent compliance was unclear.  During his hospitalization, Pt received IV lasix 40 mg daily with good urine output.  Could not be more aggressive with diuretics due to soft BP.  On the day of discharge, pt's dyspnea had improved, and was able to ambulate without breathing difficulty.  O2 sat temporarily decreased to high 80's but recovered quickly with rest.  Pt was discharged on PO Lasix 40 mg daily, and advised to follow up with PCP in about a week after discharge to check his kidney function.  Pt and daughter also advised on monitoring weight daily.  # Mild trop elevation due to demand ischemia Trop 100's and flat. No chest pain or ACS-related changes on EKG.  # Severe chronic BLE edema Diuresis as above.  Continued compression wrapping (pt reported going to a clinic weekly for compression wrapping).  # Myasthenia gravis continued home pyridostigmine  # Hx of elevated PSA and BPH continued home finasteride and Flomax  # HTN Hold home Imdur and Lisinopril since BP soft and needing to be on lasix.  # Hx of CAD continued home ASA 81 and statin   Discharge Diagnoses:  Active Problems:   Acute exacerbation of CHF (congestive heart failure) [facility])    Discharge Instructions:  Allergies as of 01/15/2020      Reactions   Beta Adrenergic Blockers Other (See Comments)   Beta Blocker will precipitate acute weakness in Lambert-Eaton Syndrome or  Myasthenia Gravis   Calcium Channel Blockers Other (See Comments)   Calcium Channel Blocker- will precipitate acute weakness in Lambert-Eaton Syndrome.   Ciprofloxacin Other (See Comments)   Last resort due to Myasthia Gravis   Sulfa Antibiotics Rash      Medication List    TAKE these medications   acetaminophen 500 MG tablet Commonly known as: TYLENOL Take 500 mg by mouth every 6 (six) hours as needed.   Align 4 MG Caps One capsule q day   aspirin 81 MG tablet Take 81 mg by mouth daily.   finasteride 5 MG tablet Commonly  known as: PROSCAR Take 5 mg by mouth daily.   furosemide 40 MG tablet Commonly known as: LASIX Take 1 tablet (40 mg total) by mouth daily. It's increased from your previous 40 mg daily. What changed:   medication strength  how much to take  additional instructions   hydroxypropyl methylcellulose / hypromellose 2.5 % ophthalmic solution Commonly known as: ISOPTO TEARS / GONIOVISC Place 1 drop into both eyes daily.   isosorbide mononitrate 30 MG 24 hr tablet Commonly known as: IMDUR Hold until outpatient followup since your blood pressure was on the low side in the hospital. What changed:   how much to take  how to take this  when to take this  additional instructions   lisinopril 5 MG tablet Commonly known as: ZESTRIL Hold until outpatient followup since your blood pressure was on the low side in the hospital. What changed:   how much to take  how to take this  when to take this  additional instructions   mycophenolate 250 MG capsule Commonly known as: CELLCEPT Take 1,000 mg by mouth 2 (two) times daily.   pravastatin 20 MG tablet Commonly known as: PRAVACHOL Take 1 tablet (20 mg total) by mouth daily.   prednisoLONE acetate 1 % ophthalmic suspension Commonly known as: PRED FORTE Place 1 drop into the right eye daily. Per patient. What changed:   when to take this  additional instructions   pyridostigmine 60 MG tablet Commonly known as: MESTINON Take 30 mg by mouth 6 (six) times daily.   tamsulosin 0.4 MG Caps capsule Commonly known as: FLOMAX Take 0.4 mg by mouth daily.       Follow-up Information    [facility] Follow up on 01/25/2020.   Specialty: Cardiology Why: at 1:00pm. Enter through the [facility] entrance Contact information: [place] [address] [place], [place], [place]. Schedule an appointment as soon as possible for a visit  in 1 week(s).   Specialty: Internal Medicine Contact information: [id number] [place] [address] [phone]           Allergies  Allergen Reactions  . Beta Adrenergic Blockers Other (See Comments)    Beta Blocker will precipitate acute weakness in Lambert-Eaton Syndrome or Myasthenia Gravis  . Calcium Channel Blockers Other (See Comments)    Calcium Channel Blocker- will precipitate acute weakness in Lambert-Eaton Syndrome.  . Ciprofloxacin Other (See Comments)    Last resort due to Myasthia Gravis  . Sulfa Antibiotics Rash     The results of significant diagnostics from this hospitalization (including imaging, microbiology, ancillary and laboratory) are listed below for reference.   Consultations:   Procedures/Studies: DG Chest 2 View  Result Date: 01/13/2020 CLINICAL DATA:  Shortness of breath EXAM: CHEST - 2 VIEW COMPARISON:  08/15/2019  FINDINGS: Stable cardiomediastinal contours. Atherosclerotic calcification of the aortic knob. Low lung volumes. Mildly prominent interstitial markings bilaterally. No focal airspace consolidation. No pleural effusion or pneumothorax. IMPRESSION: Mildly prominent bilateral interstitial markings, which could reflect mild edema or atypical infection. Electronically Signed   By: Davina Poke D.O.   On: 01/13/2020 14:06   VAS [place] LOWER EXTREMITY VENOUS REFLUX  Result Date: 12/27/2019  Lower Venous Reflux Study Indications: Swelling, and Edema.  Performing Technologist: Charlane Ferretti RT (R)(VS)  Examination Guidelines: A complete evaluation includes B-mode imaging, spectral Doppler, color Doppler, and power Doppler as needed of all accessible portions of each vessel. Bilateral testing is considered an integral part of a complete examination. Limited examinations for reoccurring indications may be performed as noted. The reflux portion of the exam is performed with the patient in reverse Trendelenburg. Significant  venous reflux is defined as >500 ms in the superficial venous system, and >1 second in the deep venous system.  Venous Reflux Times +----------+---------+----------+-----------+------------+--------+ RIGHT     Reflux NoReflux YesReflux TimeDiameter cmsComments +----------+---------+----------+-----------+------------+--------+ GSV at SFJ                     2222 ms      .60              +----------+---------+----------+-----------+------------+--------+  +----+---------+----------+-----------+------------+--------+ LEFTReflux NoReflux YesReflux TimeDiameter cmsComments +----+---------+----------+-----------+------------+--------+ CFV             yes      1782 ms                       +----+---------+----------+-----------+------------+--------+  Summary: Bilateral: - No evidence of deep vein thrombosis seen in the lower extremities, bilaterally, from the common femoral through the popliteal veins. - No evidence of superficial venous thrombosis in the lower extremities, bilaterally.  Right: - Venous reflux is noted in the right sapheno-femoral junction.  Left: - Venous reflux is noted in the left common femoral vein.  *See table(s) above for measurements and observations. Electronically signed by Leotis Pain MD on 12/27/2019 at 6:09:07 PM.    Final       Labs: BNP (last 3 results) Recent Labs    01/13/20 1315  BNP [id number]*   Basic Metabolic Panel: Recent Labs  Lab 01/13/20 1315 01/14/20 0336 01/15/20 0508  NA 140 142 139  K 4.5 4.4 4.2  CL 103 101 99  CO2 30 31 32  GLUCOSE 113* 108* 101*  BUN 16 17 24*  CREATININE 1.06 1.09 1.10  CALCIUM 8.9 8.7* 8.5*  MG  --  2.2 2.3   Liver Function Tests: No results for input(s): AST, ALT, ALKPHOS, BILITOT, PROT, ALBUMIN in the last 168 hours. No results for input(s): LIPASE, AMYLASE in the last 168 hours. No results for input(s): AMMONIA in the last 168 hours. CBC: Recent Labs  Lab 01/13/20 1315 01/14/20 0336  01/15/20 0508  WBC 6.3 5.9 4.9  NEUTROABS 4.6  --   --   HGB 12.2* 12.0* 11.7*  HCT 39.5 40.2 37.4*  MCV 94.5 96.2 93.3  PLT 187 209 175   Cardiac Enzymes: No results for input(s): CKTOTAL, CKMB, CKMBINDEX, TROPONINI in the last 168 hours. BNP: Invalid input(s): POCBNP CBG: No results for input(s): GLUCAP in the last 168 hours. D-Dimer No results for input(s): DDIMER in the last 72 hours. Hgb A1c No results for input(s): HGBA1C in the last 72 hours. Lipid Profile No results for input(s): CHOL, HDL, LDLCALC, TRIG, CHOLHDL, LDLDIRECT in  the last 72 hours. Thyroid function studies No results for input(s): TSH, T4TOTAL, T3FREE, THYROIDAB in the last 72 hours.  Invalid input(s): FREET3 Anemia work up No results for input(s): VITAMINB12, FOLATE, FERRITIN, TIBC, IRON, RETICCTPCT in the last 72 hours. Urinalysis    Component Value Date/Time   COLORURINE YELLOW 06/30/2019 1022   APPEARANCEUR Sl Cloudy (A) 06/30/2019 1022   LABSPEC 1.020 06/30/2019 1022   PHURINE 5.5 06/30/2019 1022   GLUCOSEU NEGATIVE 06/30/2019 1022   HGBUR TRACE-LYSED (A) 06/30/2019 1022   BILIRUBINUR NEGATIVE 06/30/2019 1022   BILIRUBINUR n 08/30/2012 1006   KETONESUR NEGATIVE 06/30/2019 1022   PROTEINUR 30 (A) 08/10/2017 1439   UROBILINOGEN 0.2 06/30/2019 1022   NITRITE NEGATIVE 06/30/2019 1022   LEUKOCYTESUR SMALL (A) 06/30/2019 1022   Sepsis Labs Invalid input(s): PROCALCITONIN,  WBC,  LACTICIDVEN Microbiology Recent Results (from the past 240 hour(s))  SARS CORONAVIRUS 2 (TAT 6-24 HRS) Nasopharyngeal Nasopharyngeal Swab     Status: None   Collection Time: 01/13/20  4:35 PM   Specimen: Nasopharyngeal Swab  Result Value Ref Range Status   SARS Coronavirus 2 NEGATIVE NEGATIVE Final    Comment: (NOTE) SARS-CoV-2 target nucleic acids are NOT DETECTED. The SARS-CoV-2 RNA is generally detectable in upper and lower respiratory specimens during the acute phase of infection. Negative results do not  preclude SARS-CoV-2 infection, do not rule out co-infections with other pathogens, and should not be used as the sole basis for treatment or other patient management decisions. Negative results must be combined with clinical observations, patient history, and epidemiological information. The expected result is Negative. Fact Sheet for Patients: SugarRoll.be Fact Sheet for Healthcare Providers: https://www.woods-mathews.com/ This test is not yet approved or cleared by the [place] FDA and  has been authorized for detection and/or diagnosis of SARS-CoV-2 by FDA under an Emergency Use Authorization (EUA). This EUA will remain  in effect (meaning this test can be used) for the duration of the COVID-19 declaration under Section 56 4(b)(1) of the Act, 21 U.S.C. section 360bbb-3(b)(1), unless the authorization is terminated or revoked sooner. Performed at [facility], [place] [address], [place], [place] [address]      Total time spend on discharging this patient, including the last patient exam, discussing the hospital stay, instructions for ongoing care as it relates to all pertinent caregivers, as well as preparing the medical discharge records, prescriptions, and/or referrals as applicable, is 35 minutes.    Enzo Bi, MD  [facility] 01/15/2020, 11:42 AM  If 7PM-7AM, please contact night-coverage

## 2020-01-15 NOTE — TOC Initial Note (Signed)
Transition of Care [facility]) - Initial/Assessment Note    Patient Details  Name: [name] MRN: [id number] Date of Birth: November 13, 1925  [facility]) CM/SW Contact:    [name] [place], [place] Phone Number: 01/15/2020, 11:54 AM  Clinical Narrative:                  [name] is a 86 y.o. male with possible history of CAD, CHF, hypertension, hyperlipidemia, Eaton-Lambert syndrome, and bladder cancer who presents to the ED for shortness of breath. Met with patient and patient's daughter at bedside to assess for community resource/medication needs. Per patient and patient's daughter, patient lives alone, however has multiple family members that check on patient daily. Patient states that he has regular providers and specialist that follow him and receives his Mestinon and Cellcept from the [place]. Patient uses Geographical information systems officer in [place]. Patient has no DME at home.   Patient's and patient's daughter verbalized having no community resource or medication needs at this time. Patient's daughter to transport patient home today.  [name], [place] Work [phone]  Expected Discharge Plan: Home/Self Care Barriers to Discharge: No Barriers Identified   Patient Goals and CMS Choice Patient states their goals for this hospitalization and ongoing recovery are:: "I want to get my ailments corrected"      Expected Discharge Plan and Services Expected Discharge Plan: Home/Self Care In-house Referral: Clinical Social Work     Living arrangements for the past 2 months: Single Family Home Expected Discharge Date: 01/15/20                                    Prior Living Arrangements/Services Living arrangements for the past 2 months: Single Family Home Lives with:: Self   Do you feel safe going back to the place where you live?: Yes      Need for Family Participation in Patient Care: Yes (Comment) Care giver support system in place?: Yes  (comment)   Criminal Activity/Legal Involvement Pertinent to Current Situation/Hospitalization: No - Comment as needed  Activities of Daily Living Home Assistive Devices/Equipment: None ADL Screening (condition at time of admission) Patient's cognitive ability adequate to safely complete daily activities?: Yes Is the patient deaf or have difficulty hearing?: No Does the patient have difficulty seeing, even when wearing glasses/contacts?: No Does the patient have difficulty concentrating, remembering, or making decisions?: No Patient able to express need for assistance with ADLs?: Yes Does the patient have difficulty dressing or bathing?: No Independently performs ADLs?: Yes (appropriate for developmental age) Does the patient have difficulty walking or climbing stairs?: No Weakness of Legs: Both Weakness of Arms/Hands: None  Permission Sought/Granted   Permission granted to share information with : Yes, Verbal Permission Granted        Permission granted to share info w Relationship: [name]  Permission granted to share info w Contact Information: [phone]  Emotional Assessment Appearance:: Appears stated age Attitude/Demeanor/Rapport: Engaged Affect (typically observed): Adaptable, Accepting Orientation: : Oriented to Self, Oriented to Place, Oriented to  Time, Oriented to Situation Alcohol / Substance Use: Not Applicable Psych Involvement: No (comment)  Admission diagnosis:  Shortness of breath [R06.02] Acute exacerbation of CHF (congestive heart failure) (HCC) [I50.9] Acute on chronic congestive heart failure, unspecified heart failure type [facility]) [I50.9] Patient Active Problem List   Diagnosis Date Noted  . Acute exacerbation of CHF (congestive heart failure) ([place]) 01/13/2020  .  Lower limb ulcer, calf, left, limited to breakdown of skin ([place]) 12/27/2019  . Dysuria 06/30/2019  . Hyperglycemia 06/30/2019  . LVH (left ventricular hypertrophy) due to  hypertensive disease, without heart failure 05/26/2019  . Pseudophakia, left eye 11/26/2018  . Venous insufficiency of both lower extremities 10/13/2018  . Swelling of limb 10/12/2018  . Bilateral lower extremity edema 10/10/2018  . Absent pulse in lower extremity 10/10/2018  . 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 Eye pain 05/31/2018  . Mild aortic valve stenosis 12/03/2017  . Epididymoorchitis 09/02/2017  . Acute cystitis with hematuria 08/11/2017  . Hydrocele, right 08/11/2017  . Aortic aneurysm ([place]) 03/28/2017  . Abnormal chest CT 03/28/2017  . Hypotension   . Dizziness 12/22/2016  . Leg weakness 03/11/2016  . Skin lesion of cheek 09/11/2015  . Moderate mitral insufficiency 05/18/2015  . Moderate tricuspid insufficiency 05/18/2015  . Benign essential hypertension 04/25/2015  . Rash 03/10/2015  . Health care maintenance 03/10/2015  . Bilateral carotid artery stenosis 08/30/2014  . Atherosclerosis of both carotid arteries 07/17/2014  . Chronic systolic CHF (congestive heart failure), NYHA class 3 ([place]) 07/17/2014  . CAD (coronary artery disease) 06/18/2014  . CHF (congestive heart failure) ([place]) 04/20/2014  . Congestive heart failure ([place]) 04/20/2014  . SOB (shortness of breath) 03/10/2014  . Abnormal EKG 03/10/2014  . Mixed hyperlipidemia 03/10/2014  . Benign lipomatous neoplasm of skin and subcutaneous tissue of left arm 10/04/2013  . Arm skin lesion, left 09/04/2013  . History of shingles 09/04/2013  . History of bladder cancer 03/05/2013  . Chronic prostatitis 09/30/2012  . Elevated prostate specific antigen (PSA) 09/30/2012  . Family history of malignant neoplasm of prostate 09/30/2012  . Gross hematuria 09/30/2012  . Incomplete emptying of bladder 09/30/2012  . Anemia 08/30/2012  . 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 Hypercholesterolemia 08/30/2012  . Herpes zoster 11/28/2011  . Hypertension, benign 04/02/2011  . Myasthenia gravis without  exacerbation ([place]) 03/31/2011   PCP:  Joel Pheasant, MD Pharmacy:   [place], [place] [address] [place] [address] Phone: [phone] Fax: [phone]     Social Determinants of Health (SDOH) Interventions    Readmission Risk Interventions No flowsheet data found.

## 2020-01-15 NOTE — Plan of Care (Signed)
Discharge teaching completed with patient and daughter who both verbalize understanding of teaching including medications and follow-up appointments.  Patient discharged in stable condition with all belongings.

## 2020-01-15 NOTE — Plan of Care (Signed)
Continuing with plan of care. 

## 2020-01-16 ENCOUNTER — Encounter (INDEPENDENT_AMBULATORY_CARE_PROVIDER_SITE_OTHER): Payer: Self-pay | Admitting: Nurse Practitioner

## 2020-01-16 LAB — ECHOCARDIOGRAM COMPLETE
Height: 70 in
Weight: 3221 oz

## 2020-01-16 NOTE — Telephone Encounter (Signed)
Pts daughter wanted to clarify what dose of lasix he should be on. Hospitalist d/c'd pt home on 40 mg but was told that patient had only missed a few pills. Daughter checked history with pharmacy and pt has only taken 75 pills in 6 months so he has not been on his lasix 20 mg regularly. Advised to daughter that she should call cardiology and let them know and see what they recommend. He sees [facility] on Wednesday and also sees CHF clinic on 4/28. BP cuff is not working so they have not been checking his blood pressure since discharge. She is going to call back if unable to reach cardiology.

## 2020-01-16 NOTE — Telephone Encounter (Signed)
Pt was admitted to hospital and there is a mix up of the medication and it needs clarification before discharge. She said she will be unavailable between 10-11am today.

## 2020-01-17 ENCOUNTER — Telehealth: Payer: Self-pay

## 2020-01-17 DIAGNOSIS — I350 Nonrheumatic aortic (valve) stenosis: Secondary | ICD-10-CM | POA: Diagnosis not present

## 2020-01-17 DIAGNOSIS — I5023 Acute on chronic systolic (congestive) heart failure: Secondary | ICD-10-CM | POA: Diagnosis not present

## 2020-01-17 DIAGNOSIS — R600 Localized edema: Secondary | ICD-10-CM | POA: Diagnosis not present

## 2020-01-17 DIAGNOSIS — I2510 Atherosclerotic heart disease of native coronary artery without angina pectoris: Secondary | ICD-10-CM | POA: Diagnosis not present

## 2020-01-17 NOTE — Telephone Encounter (Signed)
Please call and find out how pt is doing.  Was apparently discharged from hospital.  We will need to schedule a f/u, but want to confirm doing ok.

## 2020-01-17 NOTE — Telephone Encounter (Signed)
Patient was recently discharged from the hospital on 01/15/20.  No TCM completed, patient does not qualify for TCM services due to [employer] coverage.  Per discharge summary patient needs follow up with PCP. No HFU apt scheduled at this time. FYI to PCP.

## 2020-01-18 ENCOUNTER — Ambulatory Visit (INDEPENDENT_AMBULATORY_CARE_PROVIDER_SITE_OTHER): Payer: Medicare Other | Admitting: Nurse Practitioner

## 2020-01-18 ENCOUNTER — Other Ambulatory Visit: Payer: Self-pay

## 2020-01-18 VITALS — BP 112/67 | HR 72 | Wt 199.0 lb

## 2020-01-18 DIAGNOSIS — L97221 Non-pressure chronic ulcer of left calf limited to breakdown of skin: Secondary | ICD-10-CM | POA: Diagnosis not present

## 2020-01-18 NOTE — Progress Notes (Signed)
History of Present Illness  There is no documented history at this time  Assessments & Plan   There are no diagnoses linked to this encounter.    Additional instructions  Subjective:  Patient presents with venous ulcer of the Left lower extremity.    Procedure:  3 layer unna wrap was placed Left lower extremity.   Plan:   Follow up in one week.  

## 2020-01-18 NOTE — Telephone Encounter (Signed)
LMTCB

## 2020-01-19 ENCOUNTER — Encounter (INDEPENDENT_AMBULATORY_CARE_PROVIDER_SITE_OTHER): Payer: Self-pay | Admitting: Nurse Practitioner

## 2020-01-24 NOTE — Progress Notes (Signed)
Patient ID: [name], male    DOB: 1925-10-15, 86 y.o.   MRN: [id number]  HPI  Mr Brodzik is a 86 y/o male with a history of HTN, hyperlipidemia, CAD, carotid disease, bladder cancer, anemia, Lambert-Eaton syndrome, myasthenia gravis, previous tobacco use and chronic heart failure.   Echo report from 01/15/20 reviewed and showed an EF of 30-35% along with moderately elevated PA pressure, mild MR, mild AS and moderate TR.  Catheterization done 04/06/14 but unable to view report.  Admitted 01/13/20 due to acute on chronic heart failure. Initially needed oxygen due to desat on room air. Initially needed IV lasix and then transitioned to oral diuretics. Elevated troponin thought to be due to demand ischemia.  Discharged after 2 days.   He presents today for his initial visit with a chief complaint of minimal shortness of breath upon moderate exertion. He describes this as chronic in nature having been present for several years. He has associated fatigue, cough, pedal edema (goes to vascular), difficulty sleeping and occasional hip pain along with this. He denies any dizziness, abdominal distention, chest pain, appetite change or weight gain.   Has had difficulty with his BP dropping. Isosorbide has been stopped and lisinopril is currently on hold with checking BP at home.   Past Medical History:  Diagnosis Date  . Abnormal chest CT 03/28/2017  . Allergy   . Anemia   . Angina pectoris ([place]) 04/02/2011  . Aortic aneurysm ([place]) 03/28/2017   Saw Dr Genevive Bi 04/16/17 - recommended f/u CT in 3 months.    . Atherosclerosis of both carotid arteries 07/17/2014  . Benign lipomatous neoplasm of skin and subcutaneous tissue of left arm 10/04/2013  . Bilateral carotid artery stenosis 08/30/2014   Overview:  Less than 50% 2015  . Bladder cancer ([place]) 03/05/2013  . CAD (coronary artery disease) 06/18/2014  . Cancer ([place]) 11-24-12   bladder. BCG treatments by Dr Jacqlyn Larsen.  . Chicken pox   . Chronic prostatitis 09/30/2012   . Colon polyp   . Congestive heart failure ([place]) 04/20/2014   Overview:  Overview:  With anterior mi and moderate lv dyfunction ef 35%  . Dizziness 12/22/2016  . 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 Eaton-Lambert syndrome ([place])   . Elevated prostate specific antigen (PSA) 09/30/2012  . Essential hypertension, benign   . Gross hematuria 09/30/2012  . Herpes zoster 11/28/2011   Overview:  with ocular involvement OD  . Hypercholesterolemia   . Hypertension, benign 04/02/2011  . Hypotension   . Moderate mitral insufficiency 05/18/2015  . Moderate tricuspid insufficiency 05/18/2015  . Myasthenia gravis ([place])   . Myasthenia gravis without exacerbation ([place]) 03/31/2011  . Shingles 2013   Past Surgical History:  Procedure Laterality Date  . APPENDECTOMY  1958  . CHOLECYSTECTOMY  1979  . HERNIA REPAIR  1970  . TONSILLECTOMY  1958   Family History  Problem Relation Age of Onset  . Lung cancer Sister   . Prostate cancer Brother   . Lung cancer Brother   . Arthritis Other        parent  . Colon cancer Neg Hx    Social History   Tobacco Use  . Smoking status: Former Smoker    Quit date: 09/29/1958    Years since quitting: 61.3  . Smokeless tobacco: Never Used  Substance Use Topics  . Alcohol use: Yes    Alcohol/week: 0.0 standard drinks    Comment: occasionally   Allergies  Allergen Reactions  .  Beta Adrenergic Blockers Other (See Comments)    Beta Blocker will precipitate acute weakness in Lambert-Eaton Syndrome or Myasthenia Gravis  . Calcium Channel Blockers Other (See Comments)    Calcium Channel Blocker- will precipitate acute weakness in Lambert-Eaton Syndrome.  . Ciprofloxacin Other (See Comments)    Last resort due to Myasthia Gravis  . Sulfa Antibiotics Rash   Prior to Admission medications   Medication Sig Start Date End Date Taking? Authorizing Provider  acetaminophen (TYLENOL) 500 MG tablet Take 500 mg by mouth every 6 (six) hours as needed.   Yes  [provider]  aspirin 81 MG tablet Take 81 mg by mouth daily.   Yes [provider]  finasteride (PROSCAR) 5 MG tablet Take 5 mg by mouth daily.   Yes [provider]  furosemide (LASIX) 40 MG tablet Take 1 tablet (40 mg total) by mouth daily. It's increased from your previous 40 mg daily. 01/15/20 04/14/20 Yes Enzo Bi, MD  hydroxypropyl methylcellulose / hypromellose (ISOPTO TEARS / GONIOVISC) 2.5 % ophthalmic solution Place 1 drop into both eyes daily.   Yes [provider]  mycophenolate (CELLCEPT) 250 MG capsule Take 1,000 mg by mouth 2 (two) times daily.    Yes [provider]  pravastatin (PRAVACHOL) 20 MG tablet Take 1 tablet (20 mg total) by mouth daily. 07/27/14  Yes [name], MD  prednisoLONE acetate (PRED FORTE) 1 % ophthalmic suspension Place 1 drop into the right eye daily. Per patient. 01/15/20  Yes Enzo Bi, MD  Probiotic Product (ALIGN) 4 MG CAPS One capsule q day 06/30/19  Yes [name], MD  pyridostigmine (MESTINON) 60 MG tablet Take 30 mg by mouth 6 (six) times daily.    Yes [provider]  tamsulosin (FLOMAX) 0.4 MG CAPS Take 0.4 mg by mouth daily.   Yes [provider]  lisinopril (ZESTRIL) 5 MG tablet Hold until outpatient followup since your blood pressure was on the low side in the hospital. Patient not taking: Reported on 01/25/2020 01/15/20   Enzo Bi, MD    Review of Systems  Constitutional: Positive for fatigue.  HENT: Positive for rhinorrhea.   Eyes: Negative.   Respiratory: Positive for cough (productive) and shortness of breath (minimal).   Cardiovascular: Positive for leg swelling. Negative for chest pain and palpitations.  Gastrointestinal: Negative for abdominal distention and abdominal pain.  Endocrine: Negative.   Genitourinary: Negative.   Musculoskeletal: Positive for arthralgias (hip pain at times). Negative for back pain and neck pain.  Skin: Negative.    Allergic/Immunologic: Negative.   Neurological: Negative for dizziness and light-headedness.  Hematological: Negative for adenopathy. Does not bruise/bleed easily.  Psychiatric/Behavioral: Positive for sleep disturbance (sleeping on 1-2 pillows). Negative for dysphoric mood. The patient is not nervous/anxious.     Vitals:   01/25/20 1317  BP: 127/73  Pulse: 71  Resp: 18  SpO2: 95%  Weight: 195 lb 6 oz (88.6 kg)  Height: 5\' 10"  (1.778 m)   Wt Readings from Last 3 Encounters:  01/25/20 195 lb 6 oz (88.6 kg)  01/18/20 199 lb (90.3 kg)  01/15/20 201 lb 5 oz (91.3 kg)   Lab Results  Component Value Date   CREATININE 1.10 01/15/2020   CREATININE 1.09 01/14/2020   CREATININE 1.06 01/13/2020    Physical Exam Vitals and nursing note reviewed.  Constitutional:      Appearance: Normal appearance.  HENT:     Head: Normocephalic and atraumatic.  Cardiovascular:     Rate and  Rhythm: Normal rate and regular rhythm.  Pulmonary:     Effort: Pulmonary effort is normal. No respiratory distress.     Breath sounds: No wheezing or rales.  Abdominal:     General: There is no distension.     Palpations: Abdomen is soft.  Musculoskeletal:        General: No tenderness.     Cervical back: Normal range of motion and neck supple.     Right lower leg: Edema (2+ pitting) present.     Left lower leg: Edema (2+ pitting) present.  Skin:    General: Skin is warm and dry.  Neurological:     General: No focal deficit present.     Mental Status: He is alert and oriented to person, place, and time.  Psychiatric:        Mood and Affect: Mood normal.        Behavior: Behavior normal.        Thought Content: Thought content normal.     Assessment & Plan:  1: Chronic heart failure with reduced ejection fraction- - NYHA class II - euvolemic today - weighing daily and home weight chart was reviewed; reminded to call for an overnight weight gain of >2 pounds or a weekly weight gain of >5  pounds - not adding salt and has been looking at food labels; reviewed the importance of closely following a low sodium diet and written dietary information and low sodium cookbook were given to him - daughter has been looking up nutrition information online for restaurants  - lisinopril currently on hold due to hypotension; doubt we can switch to entresto - saw cardiology Nehemiah Massed) 01/17/20 - beta-blockers and calcium channel blockers cause weakness due to Lambert-Eaton syndrome - BNP 01/13/20 was 764.0 - has received both COVID vaccines  2: HTN- - BP looks good today - saw PCP Nicki Reaper) 10/20/19 - BMP 01/15/20 reviewed and showed sodium 139, potassium 4.2, creatinine 1.1 and GFR 58  3: Lymphedema- - stage 2 - elevating his legs when sitting for long periods of time - has had the legs wrapped in UNNA boots before; has not worn compression socks - going to vascular [facility]) later today and will discuss compression socks with them   Medication list was reviewed.   Return in 1 month or sooner for any questions/problems before then.

## 2020-01-25 ENCOUNTER — Telehealth: Payer: Self-pay | Admitting: Family

## 2020-01-25 ENCOUNTER — Encounter (INDEPENDENT_AMBULATORY_CARE_PROVIDER_SITE_OTHER): Payer: Self-pay | Admitting: Nurse Practitioner

## 2020-01-25 ENCOUNTER — Encounter: Payer: Self-pay | Admitting: Family

## 2020-01-25 ENCOUNTER — Ambulatory Visit: Payer: Medicare Other | Attending: Family | Admitting: Family

## 2020-01-25 ENCOUNTER — Other Ambulatory Visit: Payer: Self-pay

## 2020-01-25 ENCOUNTER — Ambulatory Visit (INDEPENDENT_AMBULATORY_CARE_PROVIDER_SITE_OTHER): Payer: Medicare Other | Admitting: Nurse Practitioner

## 2020-01-25 VITALS — BP 127/73 | HR 71 | Resp 18 | Ht 70.0 in | Wt 195.4 lb

## 2020-01-25 VITALS — BP 125/71 | HR 67 | Resp 16 | Wt 194.4 lb

## 2020-01-25 DIAGNOSIS — Z7982 Long term (current) use of aspirin: Secondary | ICD-10-CM | POA: Diagnosis not present

## 2020-01-25 DIAGNOSIS — I5022 Chronic systolic (congestive) heart failure: Secondary | ICD-10-CM | POA: Diagnosis not present

## 2020-01-25 DIAGNOSIS — Z882 Allergy status to sulfonamides status: Secondary | ICD-10-CM | POA: Insufficient documentation

## 2020-01-25 DIAGNOSIS — G7000 Myasthenia gravis without (acute) exacerbation: Secondary | ICD-10-CM | POA: Diagnosis not present

## 2020-01-25 DIAGNOSIS — M25559 Pain in unspecified hip: Secondary | ICD-10-CM | POA: Insufficient documentation

## 2020-01-25 DIAGNOSIS — I10 Essential (primary) hypertension: Secondary | ICD-10-CM | POA: Diagnosis not present

## 2020-01-25 DIAGNOSIS — I252 Old myocardial infarction: Secondary | ICD-10-CM | POA: Insufficient documentation

## 2020-01-25 DIAGNOSIS — I890 Lymphedema, not elsewhere classified: Secondary | ICD-10-CM | POA: Diagnosis not present

## 2020-01-25 DIAGNOSIS — Z8551 Personal history of malignant neoplasm of bladder: Secondary | ICD-10-CM | POA: Diagnosis not present

## 2020-01-25 DIAGNOSIS — Z87891 Personal history of nicotine dependence: Secondary | ICD-10-CM | POA: Diagnosis not present

## 2020-01-25 DIAGNOSIS — Z79899 Other long term (current) drug therapy: Secondary | ICD-10-CM | POA: Diagnosis not present

## 2020-01-25 DIAGNOSIS — G7080 Lambert-Eaton syndrome, unspecified: Secondary | ICD-10-CM | POA: Diagnosis not present

## 2020-01-25 DIAGNOSIS — E785 Hyperlipidemia, unspecified: Secondary | ICD-10-CM | POA: Diagnosis not present

## 2020-01-25 DIAGNOSIS — E7800 Pure hypercholesterolemia, unspecified: Secondary | ICD-10-CM | POA: Insufficient documentation

## 2020-01-25 DIAGNOSIS — I959 Hypotension, unspecified: Secondary | ICD-10-CM | POA: Insufficient documentation

## 2020-01-25 DIAGNOSIS — I2510 Atherosclerotic heart disease of native coronary artery without angina pectoris: Secondary | ICD-10-CM | POA: Insufficient documentation

## 2020-01-25 DIAGNOSIS — L97221 Non-pressure chronic ulcer of left calf limited to breakdown of skin: Secondary | ICD-10-CM | POA: Diagnosis not present

## 2020-01-25 DIAGNOSIS — Z881 Allergy status to other antibiotic agents status: Secondary | ICD-10-CM | POA: Diagnosis not present

## 2020-01-25 DIAGNOSIS — I110 Hypertensive heart disease with heart failure: Secondary | ICD-10-CM | POA: Insufficient documentation

## 2020-01-25 NOTE — Patient Instructions (Signed)
Continue weighing daily and call for an overnight weight gain of > 2 pounds or a weekly weight gain of >5 pounds. 

## 2020-01-25 NOTE — Telephone Encounter (Signed)
Spoke to patient who is doing well with no complaints. He is checking weight daily, following a Pace sodium diet and taking meds as hes suppose too. He confirmed his new patient chf clinic appointment and came today 4/28.   [name], [place]

## 2020-01-26 NOTE — Telephone Encounter (Signed)
LMTCB

## 2020-01-29 ENCOUNTER — Encounter (INDEPENDENT_AMBULATORY_CARE_PROVIDER_SITE_OTHER): Payer: Self-pay | Admitting: Nurse Practitioner

## 2020-01-29 NOTE — Progress Notes (Signed)
Subjective:    Patient ID: [name], male    DOB: 1925/12/13, 86 y.o.   MRN: [id number] Chief Complaint  Patient presents with  . Follow-up    unna boot check    Today the patient has returned after 4 weeks after bilateral Unna wraps.  The patient's lower extremity ulceration has resolved.  Patient continues to elevate his lower extremities as much as possible in addition to exercise.  He states that the wraps had a very positive effect.  He denies any fever, chills, nausea, vomiting or diarrhea.  He denies any worsening ulcerations.   Review of Systems  Cardiovascular: Positive for leg swelling.  All other systems reviewed and are negative.      Objective:   Physical Exam Vitals reviewed. Exam conducted with a chaperone present (Daughter present).  Musculoskeletal:     Right lower leg: Edema present.     Left lower leg: Edema present.  Skin:    Comments: Dermal thickening     BP 125/71 (BP Location: Right Arm)   Pulse 67   Resp 16   Wt 194 lb 6.4 oz (88.2 kg)   BMI 27.89 kg/m   Past Medical History:  Diagnosis Date  . Abnormal chest CT 03/28/2017  . Allergy   . Anemia   . Angina pectoris ([place]) 04/02/2011  . Aortic aneurysm ([place]) 03/28/2017   Saw Dr Genevive Bi 04/16/17 - recommended f/u CT in 3 months.    . Atherosclerosis of both carotid arteries 07/17/2014  . Benign lipomatous neoplasm of skin and subcutaneous tissue of left arm 10/04/2013  . Bilateral carotid artery stenosis 08/30/2014   Overview:  Less than 50% 2015  . Bladder cancer ([place]) 03/05/2013  . CAD (coronary artery disease) 06/18/2014  . Cancer ([place]) 11-24-12   bladder. BCG treatments by Dr Jacqlyn Larsen.  . Chicken pox   . Chronic prostatitis 09/30/2012  . Colon polyp   . Congestive heart failure ([place]) 04/20/2014   Overview:  Overview:  With anterior mi and moderate lv dyfunction ef 35%  . Dizziness 12/22/2016  . 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[place]) 08/30/2012  . Eaton-Lambert syndrome ([place])   . Elevated prostate  specific antigen (PSA) 09/30/2012  . Essential hypertension, benign   . Gross hematuria 09/30/2012  . Herpes zoster 11/28/2011   Overview:  with ocular involvement OD  . Hypercholesterolemia   . Hypertension, benign 04/02/2011  . Hypotension   . Moderate mitral insufficiency 05/18/2015  . Moderate tricuspid insufficiency 05/18/2015  . Myasthenia gravis ([place])   . Myasthenia gravis without exacerbation ([place]) 03/31/2011  . Shingles 2013    Social History   Socioeconomic History  . Marital status: Married    Spouse name: Not on file  . Number of children: Not on file  . Years of education: Not on file  . Highest education level: Not on file  Occupational History  . Not on file  Tobacco Use  . Smoking status: Former Smoker    Quit date: 09/29/1958    Years since quitting: 61.3  . Smokeless tobacco: Never Used  Substance and Sexual Activity  . Alcohol use: Yes    Alcohol/week: 0.0 standard drinks    Comment: occasionally  . Drug use: No  . Sexual activity: Never  Other Topics Concern  . Not on file  Social History Narrative   Pt is married with 2 children - 1 son, 1 daughter. Previously self-employed in [place]  Strain: Low Risk   . Difficulty of Paying Living Expenses: Not hard at all  Food Insecurity: No Food Insecurity  . Worried About Charity fundraiser in the Last Year: Never true  . Ran Out of Food in the Last Year: Never true  Transportation Needs: No Transportation Needs  . Lack of Transportation (Medical): No  . Lack of Transportation (Non-Medical): No  Physical Activity:   . Days of Exercise per Week:   . Minutes of Exercise per Session:   Stress: No Stress Concern Present  . Feeling of Stress : Not at all  Social Connections:   . Frequency of Communication with Friends and Family:   . Frequency of Social Gatherings with Friends and Family:   . Attends Religious Services:   . Active Member of Clubs or  Organizations:   . Attends Archivist Meetings:   Marland Kitchen Marital Status:   Intimate Partner Violence: Not At Risk  . Fear of Current or Ex-Partner: No  . Emotionally Abused: No  . Physically Abused: No  . Sexually Abused: No    Past Surgical History:  Procedure Laterality Date  . APPENDECTOMY  1958  . CHOLECYSTECTOMY  1979  . HERNIA REPAIR  1970  . TONSILLECTOMY  1958    Family History  Problem Relation Age of Onset  . Lung cancer Sister   . Prostate cancer Brother   . Lung cancer Brother   . Arthritis Other        parent  . Colon cancer Neg Hx     Allergies  Allergen Reactions  . Beta Adrenergic Blockers Other (See Comments)    Beta Blocker will precipitate acute weakness in Lambert-Eaton Syndrome or Myasthenia Gravis  . Calcium Channel Blockers Other (See Comments)    Calcium Channel Blocker- will precipitate acute weakness in Lambert-Eaton Syndrome.  . Ciprofloxacin Other (See Comments)    Last resort due to Myasthia Gravis  . Sulfa Antibiotics Rash       Assessment & Plan:   1. Lower limb ulcer, calf, left, limited to breakdown of skin ([place]) Patient ulceration has resolved.  Patient will continue with conservative therapy as outlined below.  2. Lymphedema Recommend:  No surgery or intervention at this point in time.    I have reviewed my previous discussion with the patient regarding swelling and why it causes symptoms.  Patient will continue wearing graduated compression stockings class 1 (20-30 mmHg) on a daily basis. The patient will  beginning wearing the stockings first thing in the morning and removing them in the evening. The patient is instructed specifically not to sleep in the stockings.    In addition, behavioral modification including several periods of elevation of the lower extremities during the day will be continued.  This was reviewed with the patient during the initial visit.  The patient will also continue routine exercise, especially  walking on a daily basis as was discussed during the initial visit.    Despite conservative treatments including graduated compression therapy class 1 and behavioral modification including exercise and elevation the patient  has not obtained adequate control of the lymphedema.  The patient still has stage 3 lymphedema and therefore, I believe that a lymph pump should be added to improve the control of the patient's lymphedema.  Additionally, a lymph pump is warranted because it will reduce the risk of cellulitis and ulceration in the future.  Patient should follow-up in six months    3. Benign essential hypertension Continue antihypertensive  medications as already ordered, these medications have been reviewed and there are no changes at this time.    Current Outpatient Medications on File Prior to Visit  Medication Sig Dispense Refill  . acetaminophen (TYLENOL) 500 MG tablet Take 500 mg by mouth every 6 (six) hours as needed.    Marland Kitchen aspirin 81 MG tablet Take 81 mg by mouth daily.    . finasteride (PROSCAR) 5 MG tablet Take 5 mg by mouth daily.    . furosemide (LASIX) 40 MG tablet Take 1 tablet (40 mg total) by mouth daily. It's increased from your previous 40 mg daily. 30 tablet 2  . hydroxypropyl methylcellulose / hypromellose (ISOPTO TEARS / GONIOVISC) 2.5 % ophthalmic solution Place 1 drop into both eyes daily.    . mycophenolate (CELLCEPT) 250 MG capsule Take 1,000 mg by mouth 2 (two) times daily.     . pravastatin (PRAVACHOL) 20 MG tablet Take 1 tablet (20 mg total) by mouth daily. 90 tablet 1  . prednisoLONE acetate (PRED FORTE) 1 % ophthalmic suspension Place 1 drop into the right eye daily. Per patient. 5 mL 0  . Probiotic Product (ALIGN) 4 MG CAPS One capsule q day 30 capsule 0  . pyridostigmine (MESTINON) 60 MG tablet Take 30 mg by mouth 6 (six) times daily.     . tamsulosin (FLOMAX) 0.4 MG CAPS Take 0.4 mg by mouth daily.    Marland Kitchen lisinopril (ZESTRIL) 5 MG tablet Hold until outpatient  followup since your blood pressure was on the low side in the hospital. (Patient not taking: Reported on 01/25/2020)     No current facility-administered medications on file prior to visit.    There are no Patient Instructions on file for this visit. No follow-ups on file.   Kris Hartmann, NP

## 2020-02-06 DIAGNOSIS — I2510 Atherosclerotic heart disease of native coronary artery without angina pectoris: Secondary | ICD-10-CM | POA: Diagnosis not present

## 2020-02-06 DIAGNOSIS — I10 Essential (primary) hypertension: Secondary | ICD-10-CM | POA: Diagnosis not present

## 2020-02-06 DIAGNOSIS — I5022 Chronic systolic (congestive) heart failure: Secondary | ICD-10-CM | POA: Diagnosis not present

## 2020-02-06 DIAGNOSIS — I6523 Occlusion and stenosis of bilateral carotid arteries: Secondary | ICD-10-CM | POA: Diagnosis not present

## 2020-02-08 NOTE — Telephone Encounter (Signed)
Pt doing ok. Scheduled for fasting labs and appt with Dr Nicki Reaper next week.

## 2020-02-13 ENCOUNTER — Other Ambulatory Visit: Payer: Self-pay

## 2020-02-13 ENCOUNTER — Other Ambulatory Visit (INDEPENDENT_AMBULATORY_CARE_PROVIDER_SITE_OTHER): Payer: Medicare Other

## 2020-02-13 DIAGNOSIS — I10 Essential (primary) hypertension: Secondary | ICD-10-CM | POA: Diagnosis not present

## 2020-02-13 DIAGNOSIS — R739 Hyperglycemia, unspecified: Secondary | ICD-10-CM

## 2020-02-13 DIAGNOSIS — D649 Anemia, unspecified: Secondary | ICD-10-CM | POA: Diagnosis not present

## 2020-02-13 DIAGNOSIS — E7800 Pure hypercholesterolemia, unspecified: Secondary | ICD-10-CM | POA: Diagnosis not present

## 2020-02-13 LAB — LIPID PANEL
Cholesterol: 165 mg/dL (ref 0–200)
HDL: 52.1 mg/dL (ref >39.00)
LDL Cholesterol: 98 mg/dL (ref 0–99)
NonHDL: 112.57
Total CHOL/HDL Ratio: 3
Triglycerides: 75 mg/dL (ref 0.0–149.0)
VLDL: 15 mg/dL (ref 0.0–40.0)

## 2020-02-13 LAB — CBC WITH DIFFERENTIAL/PLATELET
Basophils Absolute: 0 10*3/uL (ref 0.0–0.1)
Basophils Relative: 0.6 % (ref 0.0–3.0)
Eosinophils Absolute: 0.1 10*3/uL (ref 0.0–0.7)
Eosinophils Relative: 1.2 % (ref 0.0–5.0)
HCT: 42.4 % (ref 39.0–52.0)
Hemoglobin: 13.7 g/dL (ref 13.0–17.0)
Lymphocytes Relative: 21.5 % (ref 12.0–46.0)
Lymphs Abs: 1.1 10*3/uL (ref 0.7–4.0)
MCHC: 32.4 g/dL (ref 30.0–36.0)
MCV: 90.2 fl (ref 78.0–100.0)
Monocytes Absolute: 0.5 10*3/uL (ref 0.1–1.0)
Monocytes Relative: 9.2 % (ref 3.0–12.0)
Neutro Abs: 3.6 10*3/uL (ref 1.4–7.7)
Neutrophils Relative %: 67.5 % (ref 43.0–77.0)
Platelets: 161 10*3/uL (ref 150.0–400.0)
RBC: 4.7 Mil/uL (ref 4.22–5.81)
RDW: 15.4 % (ref 11.5–15.5)
WBC: 5.3 10*3/uL (ref 4.0–10.5)

## 2020-02-13 LAB — HEMOGLOBIN A1C: Hgb A1c MFr Bld: 6.1 % (ref 4.6–6.5)

## 2020-02-13 LAB — BASIC METABOLIC PANEL
BUN: 25 mg/dL — ABNORMAL HIGH (ref 6–23)
CO2: 35 mEq/L — ABNORMAL HIGH (ref 19–32)
Calcium: 9.2 mg/dL (ref 8.4–10.5)
Chloride: 101 mEq/L (ref 96–112)
Creatinine, Ser: 1.07 mg/dL (ref 0.40–1.50)
GFR: 64.72 mL/min (ref >60.00)
Glucose, Bld: 111 mg/dL — ABNORMAL HIGH (ref 70–99)
Potassium: 4.8 mEq/L (ref 3.5–5.1)
Sodium: 140 mEq/L (ref 135–145)

## 2020-02-13 LAB — HEPATIC FUNCTION PANEL
ALT: 10 U/L (ref 0–53)
AST: 16 U/L (ref 0–37)
Albumin: 4.2 g/dL (ref 3.5–5.2)
Alkaline Phosphatase: 66 U/L (ref 39–117)
Bilirubin, Direct: 0.2 mg/dL (ref 0.0–0.3)
Total Bilirubin: 0.7 mg/dL (ref 0.2–1.2)
Total Protein: 6.2 g/dL (ref 6.0–8.3)

## 2020-02-14 ENCOUNTER — Telehealth (INDEPENDENT_AMBULATORY_CARE_PROVIDER_SITE_OTHER): Payer: Medicare Other | Admitting: Internal Medicine

## 2020-02-14 DIAGNOSIS — I712 Thoracic aortic aneurysm, without rupture, unspecified: Secondary | ICD-10-CM

## 2020-02-14 DIAGNOSIS — Z8551 Personal history of malignant neoplasm of bladder: Secondary | ICD-10-CM

## 2020-02-14 DIAGNOSIS — R739 Hyperglycemia, unspecified: Secondary | ICD-10-CM | POA: Diagnosis not present

## 2020-02-14 DIAGNOSIS — D649 Anemia, unspecified: Secondary | ICD-10-CM

## 2020-02-14 DIAGNOSIS — E7800 Pure hypercholesterolemia, unspecified: Secondary | ICD-10-CM

## 2020-02-14 DIAGNOSIS — G7080 Lambert-Eaton syndrome, unspecified: Secondary | ICD-10-CM

## 2020-02-14 DIAGNOSIS — I5022 Chronic systolic (congestive) heart failure: Secondary | ICD-10-CM

## 2020-02-14 DIAGNOSIS — I10 Essential (primary) hypertension: Secondary | ICD-10-CM

## 2020-02-14 DIAGNOSIS — I872 Venous insufficiency (chronic) (peripheral): Secondary | ICD-10-CM | POA: Diagnosis not present

## 2020-02-14 DIAGNOSIS — G7000 Myasthenia gravis without (acute) exacerbation: Secondary | ICD-10-CM

## 2020-02-14 DIAGNOSIS — I2510 Atherosclerotic heart disease of native coronary artery without angina pectoris: Secondary | ICD-10-CM

## 2020-02-14 LAB — TSH: TSH: 2.65 u[IU]/mL (ref 0.35–4.50)

## 2020-02-14 LAB — FERRITIN: Ferritin: 103.2 ng/mL (ref 22.0–322.0)

## 2020-02-14 NOTE — Progress Notes (Signed)
Patient ID: [name], male   DOB: 1926-02-03, 86 y.o.   MRN: [id number]   Virtual Visit via telephone Note  This visit type was conducted due to national recommendations for restrictions regarding the COVID-19 pandemic (e.g. social distancing).  This format is felt to be most appropriate for this patient at this time.  All issues noted in this document were discussed and addressed.  No physical exam was performed (except for noted visual exam findings with Video Visits).   I connected with Debera Lat by telephone and verified that I am speaking with the correct person using two identifiers. Location patient: home Location provider: work Persons participating in the telephone visit: patient, provider  The limitations, risks, security and privacy concerns of performing an evaluation and management service by telephone and the availability of in person appointments have been discussed.   It has also been discussed with the patient that there may be a patient responsible charge related to this service. The patient has expressed understanding and has agreed to proceed.   Reason for visit:  Follow up appt  HPI: Has a history of HFrEF <40%, hypercholesterolemia and hypertension.  Saw cardiology 02/06/20.  Stable.  No changes made. Also has been followed by [facility].  Skin ulceration has resolved.   Swelling improved.  On lasix daily.  Also on imdur and lisinopril.  Blood pressure doing well 116/70 and 130/70.  Feels breathing stable.  No chest pain.  No acid reflux.  Eating.  No abdominal pain or cramping.  No nausea or vomiting.  Handling stress.  Has good support.     ROS: See pertinent positives and negatives per HPI.  Past Medical History:  Diagnosis Date  . Abnormal chest CT 03/28/2017  . Allergy   . Anemia   . Angina pectoris ([place]) 04/02/2011  . Aortic aneurysm ([place]) 03/28/2017   Saw Dr Genevive Bi 04/16/17 - recommended f/u CT in 3 months.    . Atherosclerosis of both carotid arteries  07/17/2014  . Benign lipomatous neoplasm of skin and subcutaneous tissue of left arm 10/04/2013  . Bilateral carotid artery stenosis 08/30/2014   Overview:  Less than 50% 2015  . Bladder cancer ([place]) 03/05/2013  . CAD (coronary artery disease) 06/18/2014  . Cancer ([place]) 11-24-12   bladder. BCG treatments by Dr Jacqlyn Larsen.  . Chicken pox   . Chronic prostatitis 09/30/2012  . Colon polyp   . Congestive heart failure ([place]) 04/20/2014   Overview:  Overview:  With anterior mi and moderate lv dyfunction ef 35%  . Dizziness 12/22/2016  . 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 Eaton-Lambert syndrome ([place])   . Elevated prostate specific antigen (PSA) 09/30/2012  . Essential hypertension, benign   . Gross hematuria 09/30/2012  . Herpes zoster 11/28/2011   Overview:  with ocular involvement OD  . Hypercholesterolemia   . Hypertension, benign 04/02/2011  . Hypotension   . Moderate mitral insufficiency 05/18/2015  . Moderate tricuspid insufficiency 05/18/2015  . Myasthenia gravis ([place])   . Myasthenia gravis without exacerbation ([place]) 03/31/2011  . Shingles 2013    Past Surgical History:  Procedure Laterality Date  . APPENDECTOMY  1958  . CHOLECYSTECTOMY  1979  . HERNIA REPAIR  1970  . TONSILLECTOMY  1958    Family History  Problem Relation Age of Onset  . Lung cancer Sister   . Prostate cancer Brother   . Lung cancer Brother   . Arthritis Other        parent  .  Colon cancer Neg Hx     SOCIAL HX: reviewed.    Current Outpatient Medications:  .  acetaminophen (TYLENOL) 500 MG tablet, Take 500 mg by mouth every 6 (six) hours as needed., Disp: , Rfl:  .  aspirin 81 MG tablet, Take 81 mg by mouth daily., Disp: , Rfl:  .  finasteride (PROSCAR) 5 MG tablet, Take 5 mg by mouth daily., Disp: , Rfl:  .  furosemide (LASIX) 40 MG tablet, Take 1 tablet (40 mg total) by mouth daily. It's increased from your previous 40 mg daily., Disp: 30 tablet, Rfl: 2 .  hydroxypropyl methylcellulose / hypromellose  (ISOPTO TEARS / GONIOVISC) 2.5 % ophthalmic solution, Place 1 drop into both eyes daily., Disp: , Rfl:  .  lisinopril (ZESTRIL) 5 MG tablet, Hold until outpatient followup since your blood pressure was on the low side in the hospital., Disp: , Rfl:  .  mycophenolate (CELLCEPT) 250 MG capsule, Take 1,000 mg by mouth 2 (two) times daily. , Disp: , Rfl:  .  pravastatin (PRAVACHOL) 20 MG tablet, Take 1 tablet (20 mg total) by mouth daily., Disp: 90 tablet, Rfl: 1 .  prednisoLONE acetate (PRED FORTE) 1 % ophthalmic suspension, Place 1 drop into the right eye daily. Per patient., Disp: 5 mL, Rfl: 0 .  Probiotic Product (ALIGN) 4 MG CAPS, One capsule q day, Disp: 30 capsule, Rfl: 0 .  pyridostigmine (MESTINON) 60 MG tablet, Take 30 mg by mouth 6 (six) times daily. , Disp: , Rfl:  .  tamsulosin (FLOMAX) 0.4 MG CAPS, Take 0.4 mg by mouth daily., Disp: , Rfl:   EXAM:  VITALS per patient if applicable: 641/58  GENERAL: alert, oriented, appears well and in no acute distress  HEENT: atraumatic, conjunttiva clear, no obvious abnormalities on inspection of external nose and ears  NECK: normal movements of the head and neck  LUNGS: on inspection no signs of respiratory distress, breathing rate appears normal, no obvious gross SOB, gasping or wheezing  CV: no obvious cyanosis  PSYCH/NEURO: pleasant and cooperative, no obvious depression or anxiety, speech and thought processing grossly intact  ASSESSMENT AND PLAN:  Discussed the following assessment and plan:  Venous insufficiency of both lower extremities Swelling improved.  Sees [facility].  Ulceration resolved.    Myasthenia gravis without exacerbation ([place]) Has been followed by neurology.  Stable.    Hypertension, benign On lisinopril and lasix.  Pressures as outlined.  Follow pressures.  Follow metabolic panel.    Hyperglycemia Low carb diet and exercise.  Follow met b and a1c.   Hypercholesterolemia On pravastatin.  Low cholesterol diet and  exercise.  Follow lipid panel and liver function tests.   History of bladder cancer Followed by urology.   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  Stable.   CHF (congestive heart failure) (HCC) No evidence of volume overload today.  On lasix and lisiinopril.  Follow.    CAD (coronary artery disease) Has seen cardiology.  Continue risk factor modification.  Stable.   Benign essential hypertension Blood pressure doing well on lasix and lisinopril.  Follow pressures.  Follow metabolic panel.   Aortic aneurysm [facility]) Evaluated 07/2018 - Dr Genevive Bi.  Stable.  Per note, will see back on as needed basis.    Anemia Follow [facility]bc.     I discussed the assessment and treatment plan with the patient. The patient was provided an opportunity to ask questions and all were answered. The patient agreed with the plan and demonstrated an understanding of  the instructions.   The patient was advised to call back or seek an in-person evaluation if the symptoms worsen or if the condition fails to improve as anticipated.  I provided 22 minutes of non-face-to-face time during this encounter.   [name], MD

## 2020-02-23 ENCOUNTER — Ambulatory Visit: Payer: Medicare Other | Attending: Family | Admitting: Family

## 2020-02-23 ENCOUNTER — Other Ambulatory Visit: Payer: Self-pay

## 2020-02-23 ENCOUNTER — Encounter: Payer: Self-pay | Admitting: Family

## 2020-02-23 VITALS — BP 124/64 | HR 64 | Resp 18 | Ht 70.0 in | Wt 198.1 lb

## 2020-02-23 DIAGNOSIS — Z7982 Long term (current) use of aspirin: Secondary | ICD-10-CM | POA: Insufficient documentation

## 2020-02-23 DIAGNOSIS — E785 Hyperlipidemia, unspecified: Secondary | ICD-10-CM | POA: Diagnosis not present

## 2020-02-23 DIAGNOSIS — G7080 Lambert-Eaton syndrome, unspecified: Secondary | ICD-10-CM | POA: Diagnosis not present

## 2020-02-23 DIAGNOSIS — Z882 Allergy status to sulfonamides status: Secondary | ICD-10-CM | POA: Diagnosis not present

## 2020-02-23 DIAGNOSIS — I5022 Chronic systolic (congestive) heart failure: Secondary | ICD-10-CM | POA: Diagnosis not present

## 2020-02-23 DIAGNOSIS — I252 Old myocardial infarction: Secondary | ICD-10-CM | POA: Diagnosis not present

## 2020-02-23 DIAGNOSIS — G7000 Myasthenia gravis without (acute) exacerbation: Secondary | ICD-10-CM | POA: Insufficient documentation

## 2020-02-23 DIAGNOSIS — I10 Essential (primary) hypertension: Secondary | ICD-10-CM

## 2020-02-23 DIAGNOSIS — E7800 Pure hypercholesterolemia, unspecified: Secondary | ICD-10-CM | POA: Insufficient documentation

## 2020-02-23 DIAGNOSIS — I2510 Atherosclerotic heart disease of native coronary artery without angina pectoris: Secondary | ICD-10-CM | POA: Insufficient documentation

## 2020-02-23 DIAGNOSIS — Z87891 Personal history of nicotine dependence: Secondary | ICD-10-CM | POA: Insufficient documentation

## 2020-02-23 DIAGNOSIS — Z8551 Personal history of malignant neoplasm of bladder: Secondary | ICD-10-CM | POA: Insufficient documentation

## 2020-02-23 DIAGNOSIS — I890 Lymphedema, not elsewhere classified: Secondary | ICD-10-CM

## 2020-02-23 DIAGNOSIS — Z79899 Other long term (current) drug therapy: Secondary | ICD-10-CM | POA: Insufficient documentation

## 2020-02-23 DIAGNOSIS — Z881 Allergy status to other antibiotic agents status: Secondary | ICD-10-CM | POA: Insufficient documentation

## 2020-02-23 DIAGNOSIS — I110 Hypertensive heart disease with heart failure: Secondary | ICD-10-CM | POA: Diagnosis not present

## 2020-02-23 NOTE — Progress Notes (Signed)
Patient ID: [name], male    DOB: 09-Jun-1926, 86 y.o.   MRN: [id number]  HPI  [name] is a 86 y/o male with a history of HTN, hyperlipidemia, CAD, carotid disease, bladder cancer, anemia, Lambert-Eaton syndrome, myasthenia gravis, previous tobacco use and chronic heart failure.   Echo report from 01/15/20 reviewed and showed an EF of 30-35% along with moderately elevated PA pressure, mild Joel, mild AS and moderate TR.  Catheterization done 04/06/14 but unable to view report.  Admitted 01/13/20 due to acute on chronic heart failure. Initially needed oxygen due to desat on room air. Initially needed IV lasix and then transitioned to oral diuretics. Elevated troponin thought to be due to demand ischemia.  Discharged after 2 days.   He presents today for a follow-up visit with a chief complaint of minimal fatigue upon moderate exertion. He describes this as chronic in nature having been present for several years. He has associated pedal edema and chronic difficulty sleeping along with this. He denies any dizziness, abdominal distention, palpitations, chest pain, shortness of breath, cough or weight gain.   Says that he hasn't taken his diuretic yet today but will do so when he returns home. Says that he was in [place] for a few days at his daughter's and went to numerous softball games for his granddaughter so hasn't been elevating his legs as much as he should.   Past Medical History:  Diagnosis Date  . Abnormal chest CT 03/28/2017  . Allergy   . Anemia   . Angina pectoris ([place]) 04/02/2011  . Aortic aneurysm ([place]) 03/28/2017   Saw Dr [name] 04/16/17 - recommended f/u CT in 3 months.    . Atherosclerosis of both carotid arteries 07/17/2014  . Benign lipomatous neoplasm of skin and subcutaneous tissue of left arm 10/04/2013  . Bilateral carotid artery stenosis 08/30/2014   Overview:  Less than 50% 2015  . Bladder cancer ([place]) 03/05/2013  . CAD (coronary artery disease) 06/18/2014  . Cancer ([place])  11-24-12   bladder. BCG treatments by Dr [name].  . Chicken pox   . Chronic prostatitis 09/30/2012  . Colon polyp   . Congestive heart failure ([place]) 04/20/2014   Overview:  Overview:  With anterior mi and moderate lv dyfunction ef 35%  . Dizziness 12/22/2016  . 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 Eaton-Lambert syndrome ([place])   . Elevated prostate specific antigen (PSA) 09/30/2012  . Essential hypertension, benign   . Gross hematuria 09/30/2012  . Herpes zoster 11/28/2011   Overview:  with ocular involvement OD  . Hypercholesterolemia   . Hypertension, benign 04/02/2011  . Hypotension   . Moderate mitral insufficiency 05/18/2015  . Moderate tricuspid insufficiency 05/18/2015  . Myasthenia gravis ([place])   . Myasthenia gravis without exacerbation ([place]) 03/31/2011  . Shingles 2013   Past Surgical History:  Procedure Laterality Date  . APPENDECTOMY  1958  . CHOLECYSTECTOMY  1979  . HERNIA REPAIR  1970  . TONSILLECTOMY  1958   Family History  Problem Relation Age of Onset  . Lung cancer Sister   . Prostate cancer Brother   . Lung cancer Brother   . Arthritis Other        parent  . Colon cancer Neg Hx    Social History   Tobacco Use  . Smoking status: Former Smoker    Quit date: 09/29/1958    Years since quitting: 61.4  . Smokeless tobacco: Never Used  Substance Use Topics  . Alcohol  use: Yes    Alcohol/week: 0.0 standard drinks    Comment: occasionally   Allergies  Allergen Reactions  . Beta Adrenergic Blockers Other (See Comments)    Beta Blocker will precipitate acute weakness in Lambert-Eaton Syndrome or Myasthenia Gravis  . Calcium Channel Blockers Other (See Comments)    Calcium Channel Blocker- will precipitate acute weakness in Lambert-Eaton Syndrome.  . Ciprofloxacin Other (See Comments)    Last resort due to Myasthia Gravis  . Sulfa Antibiotics Rash   Prior to Admission medications   Medication Sig Start Date End Date Taking? Authorizing Provider   acetaminophen (TYLENOL) 500 MG tablet Take 500 mg by mouth every 6 (six) hours as needed.   Yes [provider]  aspirin 81 MG tablet Take 81 mg by mouth daily.   Yes [provider]  finasteride (PROSCAR) 5 MG tablet Take 5 mg by mouth daily.   Yes [provider]  furosemide (LASIX) 40 MG tablet Take 1 tablet (40 mg total) by mouth daily. It's increased from your previous 40 mg daily. 01/15/20 04/14/20 Yes Enzo Bi, MD  hydroxypropyl methylcellulose / hypromellose (ISOPTO TEARS / GONIOVISC) 2.5 % ophthalmic solution Place 1 drop into both eyes daily.   Yes [provider]  lisinopril (ZESTRIL) 5 MG tablet Hold until outpatient followup since your blood pressure was on the low side in the hospital. 01/15/20  Yes Enzo Bi, MD  mycophenolate (CELLCEPT) 250 MG capsule Take 1,000 mg by mouth 2 (two) times daily.    Yes [provider]  pravastatin (PRAVACHOL) 20 MG tablet Take 1 tablet (20 mg total) by mouth daily. 07/27/14  Yes [name], MD  prednisoLONE acetate (PRED FORTE) 1 % ophthalmic suspension Place 1 drop into the right eye daily. Per patient. 01/15/20  Yes Enzo Bi, MD  Probiotic Product (ALIGN) 4 MG CAPS One capsule q day 06/30/19   [name], MD  pyridostigmine (MESTINON) 60 MG tablet Take 30 mg by mouth 6 (six) times daily.     [provider]  tamsulosin (FLOMAX) 0.4 MG CAPS Take 0.4 mg by mouth daily.    [provider]     Review of Systems  Constitutional: Positive for fatigue. Negative for appetite change.  HENT: Negative for congestion, rhinorrhea and sore throat.   Eyes: Negative.   Respiratory: Negative for cough, shortness of breath and wheezing.   Cardiovascular: Positive for leg swelling. Negative for chest pain and palpitations.  Gastrointestinal: Negative for abdominal distention and abdominal pain.  Endocrine: Negative.   Genitourinary: Negative.   Musculoskeletal: Positive for arthralgias (hip  pain at times). Negative for back pain and neck pain.  Skin: Positive for color change (chronic redness lower legs).  Allergic/Immunologic: Negative.   Neurological: Negative for dizziness and light-headedness.  Hematological: Negative for adenopathy. Does not bruise/bleed easily.  Psychiatric/Behavioral: Positive for sleep disturbance (sleeping on 1-2 pillows). Negative for dysphoric mood. The patient is not nervous/anxious.    Vitals:   02/23/20 1422  BP: 124/64  Pulse: 64  Resp: 18  SpO2: 94%  Weight: 198 lb 2 oz (89.9 kg)  Height: 5\' 10"  (1.778 m)   Wt Readings from Last 3 Encounters:  02/23/20 198 lb 2 oz (89.9 kg)  02/14/20 192 lb (87.1 kg)  01/25/20 194 lb 6.4 oz (88.2 kg)   Lab Results  Component Value Date   CREATININE 1.07 02/13/2020   CREATININE 1.10 01/15/2020   CREATININE 1.09 01/14/2020    Physical Exam Vitals and nursing note  reviewed.  Constitutional:      Appearance: Normal appearance.  HENT:     Head: Normocephalic and atraumatic.  Cardiovascular:     Rate and Rhythm: Normal rate and regular rhythm.  Pulmonary:     Effort: Pulmonary effort is normal. No respiratory distress.     Breath sounds: Rales (LLL) present. No wheezing.  Abdominal:     General: There is no distension.     Palpations: Abdomen is soft.  Musculoskeletal:        General: No tenderness.     Cervical back: Normal range of motion and neck supple.     Right lower leg: Edema (1+ pitting) present.     Left lower leg: Edema (2+ pitting) present.  Skin:    General: Skin is warm and dry.  Neurological:     General: No focal deficit present.     Mental Status: He is alert and oriented to person, place, and time.  Psychiatric:        Mood and Affect: Mood normal.        Behavior: Behavior normal.        Thought Content: Thought content normal.     Assessment & Plan:  1: Chronic heart failure with reduced ejection fraction- - NYHA class II - euvolemic today - weighing daily and  says that his home weight was stable; reminded to call for an overnight weight gain of >2 pounds or a weekly weight gain of >5 pounds - weight up 3 pounds from last visit here 1 month ago - not adding salt and has been looking at food labels; reviewed the importance of closely following a low sodium diet - unsure if BP could tolerate entresto - due to rales in LLL, advised patient to take 2 of his diuretic for 2 days - saw cardiology [name]) 02/06/20 - beta-blockers and calcium channel blockers cause weakness due to Lambert-Eaton syndrome - BNP 01/13/20 was 764.0 - has received both COVID vaccines  2: HTN- - BP looks good today - had telemedicine visit with PCP [name]) 02/14/20 - BMP 02/13/20 reviewed and showed sodium 140, potassium 4.8, creatinine 1.07 and GFR 64.72  3: Lymphedema- - stage 2 - elevating his legs when sitting for long periods of time but was out of town so didn't elevate them as much as he should - has had the legs wrapped in UNNA boots before; has not worn compression socks & he was encouraged to get some and put them on every morning with removal at bedtime - saw vascular ([name])01/25/20   Medication list was reviewed.   Return in 1 month or sooner for any questions/problems before then.

## 2020-02-23 NOTE — Patient Instructions (Signed)
Continue weighing daily and call for an overnight weight gain of > 2 pounds or a weekly weight gain of >5 pounds.    Get compression socks and put them on every morning with removal at bedtime 

## 2020-02-27 ENCOUNTER — Encounter: Payer: Self-pay | Admitting: Internal Medicine

## 2020-02-27 NOTE — Assessment & Plan Note (Signed)
On pravastatin.  Low cholesterol diet and exercise.  Follow lipid panel and liver function tests.   

## 2020-02-27 NOTE — Assessment & Plan Note (Signed)
Blood pressure doing well on lasix and lisinopril.  Follow pressures.  Follow metabolic panel.

## 2020-02-27 NOTE — Assessment & Plan Note (Signed)
Follow cbc.  

## 2020-02-27 NOTE — Assessment & Plan Note (Signed)
Followed by urology.   

## 2020-02-27 NOTE — Assessment & Plan Note (Signed)
Evaluated 07/2018 - Dr Genevive Bi.  Stable.  Per note, will see back on as needed basis.

## 2020-02-27 NOTE — Assessment & Plan Note (Signed)
Has seen cardiology.  Continue risk factor modification.  Stable.

## 2020-02-27 NOTE — Assessment & Plan Note (Signed)
On lisinopril and lasix.  Pressures as outlined.  Follow pressures.  Follow metabolic panel.

## 2020-02-27 NOTE — Assessment & Plan Note (Signed)
Low carb diet and exercise.  Follow met b and a1c.  

## 2020-02-27 NOTE — Assessment & Plan Note (Signed)
Followed by neurology.  Stable  

## 2020-02-27 NOTE — Assessment & Plan Note (Signed)
Has been followed by neurology.  Stable.  

## 2020-02-27 NOTE — Assessment & Plan Note (Signed)
Swelling improved.  Sees [facility].  Ulceration resolved.

## 2020-02-27 NOTE — Assessment & Plan Note (Signed)
No evidence of volume overload today.  On lasix and lisiinopril.  Follow.

## 2020-03-30 NOTE — Progress Notes (Signed)
Patient ID: [name], male    DOB: 1925/12/03, 86 y.o.   MRN: [id number]  HPI  [name] is a 86 y/o male with a history of HTN, hyperlipidemia, CAD, carotid disease, bladder cancer, anemia, Lambert-Eaton syndrome, myasthenia gravis, previous tobacco use and chronic heart failure.   Echo report from 01/15/20 reviewed and showed an EF of 30-35% along with moderately elevated PA pressure, mild MR, mild AS and moderate TR.  Catheterization done 04/06/14 but unable to view report.  Admitted 01/13/20 due to acute on chronic heart failure. Initially needed oxygen due to desat on room air. Initially needed IV lasix and then transitioned to oral diuretics. Elevated troponin thought to be due to demand ischemia.  Discharged after 2 days.   He presents today for a follow-up visit with a chief complaint of minimal shortness of breath upon moderate exertion. He describes this as chronic in nature having been present for several years. He has associated fatigue, pedal edema (left > right), occasional hip pain and chronic difficulty sleeping along with this. He denies any dizziness, abdominal distention, palpitations,chest pain, wheezing or cough.   Is quite active and was just power washing his building yesterday.   Did have an overnight weight gain of almost 5 pounds over the holiday weekend. Couldn't recall anything that he had eaten that was salty so he took an additional furosemide and his weight dropped back down to his baseline.   Past Medical History:  Diagnosis Date  . Abnormal chest CT 03/28/2017  . Allergy   . Anemia   . Angina pectoris ([place]) 04/02/2011  . Aortic aneurysm ([place]) 03/28/2017   Saw Dr Genevive Bi 04/16/17 - recommended f/u CT in 3 months.    . Atherosclerosis of both carotid arteries 07/17/2014  . Benign lipomatous neoplasm of skin and subcutaneous tissue of left arm 10/04/2013  . Bilateral carotid artery stenosis 08/30/2014   Overview:  Less than 50% 2015  . Bladder cancer ([place])  03/05/2013  . CAD (coronary artery disease) 06/18/2014  . Cancer ([place]) 11-24-12   bladder. BCG treatments by Dr Jacqlyn Larsen.  . Chicken pox   . Chronic prostatitis 09/30/2012  . Colon polyp   . Congestive heart failure ([place]) 04/20/2014   Overview:  Overview:  With anterior mi and moderate lv dyfunction ef 35%  . Dizziness 12/22/2016  . 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 Eaton-Lambert syndrome ([place])   . Elevated prostate specific antigen (PSA) 09/30/2012  . Essential hypertension, benign   . Gross hematuria 09/30/2012  . Herpes zoster 11/28/2011   Overview:  with ocular involvement OD  . Hypercholesterolemia   . Hypertension, benign 04/02/2011  . Hypotension   . Moderate mitral insufficiency 05/18/2015  . Moderate tricuspid insufficiency 05/18/2015  . Myasthenia gravis ([place])   . Myasthenia gravis without exacerbation ([place]) 03/31/2011  . Shingles 2013   Past Surgical History:  Procedure Laterality Date  . APPENDECTOMY  1958  . CHOLECYSTECTOMY  1979  . HERNIA REPAIR  1970  . TONSILLECTOMY  1958   Family History  Problem Relation Age of Onset  . Lung cancer Sister   . Prostate cancer Brother   . Lung cancer Brother   . Arthritis Other        parent  . Colon cancer Neg Hx    Social History   Tobacco Use  . Smoking status: Former Smoker    Quit date: 09/29/1958    Years since quitting: 61.5  . Smokeless tobacco: Never Used  Substance Use Topics  . Alcohol use: Yes    Alcohol/week: 0.0 standard drinks    Comment: occasionally   Allergies  Allergen Reactions  . Beta Adrenergic Blockers Other (See Comments)    Beta Blocker will precipitate acute weakness in Lambert-Eaton Syndrome or Myasthenia Gravis  . Calcium Channel Blockers Other (See Comments)    Calcium Channel Blocker- will precipitate acute weakness in Lambert-Eaton Syndrome.  . Ciprofloxacin Other (See Comments)    Last resort due to Myasthia Gravis  . Sulfa Antibiotics Rash   Prior to Admission medications    Medication Sig Start Date End Date Taking? Authorizing Provider  acetaminophen (TYLENOL) 500 MG tablet Take 500 mg by mouth every 6 (six) hours as needed.   Yes [provider]  aspirin 81 MG tablet Take 81 mg by mouth daily.   Yes [provider]  finasteride (PROSCAR) 5 MG tablet Take 5 mg by mouth daily.   Yes [provider]  furosemide (LASIX) 40 MG tablet Take 1 tablet (40 mg total) by mouth daily. It's increased from your previous 40 mg daily. 01/15/20 04/14/20 Yes Enzo Bi, MD  hydroxypropyl methylcellulose / hypromellose (ISOPTO TEARS / GONIOVISC) 2.5 % ophthalmic solution Place 1 drop into both eyes daily.   Yes [provider]  lisinopril (ZESTRIL) 5 MG tablet Hold until outpatient followup since your blood pressure was on the low side in the hospital. 01/15/20  Yes Enzo Bi, MD  mycophenolate (CELLCEPT) 250 MG capsule Take 1,000 mg by mouth 2 (two) times daily.    Yes [provider]  pravastatin (PRAVACHOL) 20 MG tablet Take 1 tablet (20 mg total) by mouth daily. 07/27/14  Yes [name], MD  prednisoLONE acetate (PRED FORTE) 1 % ophthalmic suspension Place 1 drop into the right eye daily. Per patient. 01/15/20  Yes Enzo Bi, MD  Probiotic Product (ALIGN) 4 MG CAPS One capsule q day 06/30/19  Yes [name], MD  pyridostigmine (MESTINON) 60 MG tablet Take 30 mg by mouth 6 (six) times daily.    Yes [provider]  tamsulosin (FLOMAX) 0.4 MG CAPS Take 0.4 mg by mouth daily.   Yes [provider]    Review of Systems  Constitutional: Positive for fatigue. Negative for appetite change.  HENT: Negative for congestion, rhinorrhea and sore throat.   Eyes: Negative.   Respiratory: Positive for shortness of breath. Negative for cough and wheezing.   Cardiovascular: Positive for leg swelling. Negative for chest pain and palpitations.  Gastrointestinal: Negative for abdominal distention and abdominal pain.  Endocrine:  Negative.   Genitourinary: Negative.   Musculoskeletal: Positive for arthralgias (hip pain at times). Negative for back pain and neck pain.  Skin: Positive for color change (chronic redness lower legs).  Allergic/Immunologic: Negative.   Neurological: Negative for dizziness and light-headedness.  Hematological: Negative for adenopathy. Does not bruise/bleed easily.  Psychiatric/Behavioral: Positive for sleep disturbance (sleeping on 1-2 pillows). Negative for dysphoric mood. The patient is not nervous/anxious.    Vitals:   04/03/20 1358  BP: 127/75  Pulse: 64  Resp: 18  SpO2: 96%  Weight: 198 lb 6 oz (90 kg)  Height: 5\' 10"  (1.778 m)   Wt Readings from Last 3 Encounters:  04/03/20 198 lb 6 oz (90 kg)  02/23/20 198 lb 2 oz (89.9 kg)  02/14/20 192 lb (87.1 kg)   Lab Results  Component Value Date   CREATININE 1.07 02/13/2020   CREATININE 1.10 01/15/2020   CREATININE 1.09 01/14/2020  Physical Exam Vitals and nursing note reviewed.  Constitutional:      Appearance: Normal appearance.  HENT:     Head: Normocephalic and atraumatic.  Cardiovascular:     Rate and Rhythm: Normal rate and regular rhythm.  Pulmonary:     Effort: Pulmonary effort is normal. No respiratory distress.     Breath sounds: No wheezing or rales.  Abdominal:     General: There is no distension.     Palpations: Abdomen is soft.  Musculoskeletal:        General: No tenderness.     Cervical back: Normal range of motion and neck supple.     Right lower leg: Edema (1+ pitting) present.     Left lower leg: Edema (2+ pitting) present.  Skin:    General: Skin is warm and dry.  Neurological:     General: No focal deficit present.     Mental Status: He is alert and oriented to person, place, and time.  Psychiatric:        Mood and Affect: Mood normal.        Behavior: Behavior normal.        Thought Content: Thought content normal.     Assessment & Plan:  1: Chronic heart failure with reduced  ejection fraction- - NYHA class II - euvolemic today - weighing daily and says that his home weight was stable except for the one day his weight jumped up (resolved after taking 1 additional diuretic dose); reminded to call for an overnight weight gain of >2 pounds or a weekly weight gain of >5 pounds - advised him to mark on his paper when he takes any additional diuretic - weight stable from last visit here 1 month ago - not adding salt and has been looking at food labels; reminded to closely follow a low sodium diet - unsure if BP could tolerate entresto as it's been low in the past - saw cardiology Nehemiah Massed) 02/06/20 - beta-blockers and calcium channel blockers cause weakness due to Lambert-Eaton syndrome - BNP 01/13/20 was 764.0 - has received both COVID vaccines  2: HTN- - BP looks good today - had telemedicine visit with PCP Nicki Reaper) 02/14/20 - BMP 02/13/20 reviewed and showed sodium 140, potassium 4.8, creatinine 1.07 and GFR 64.72  3: Lymphedema- - stage 2 - elevating his legs when sitting for long periods of time  - does have compression socks but admits that he has difficulty in getting them on sometimes - discussed compression boots with patient but he says that he'd like to try getting the compression socks on consistently first - saw vascular Owens Shark) 01/25/20   Patient did not bring his medications nor a list. Each medication was verbally reviewed with the patient and he was encouraged to bring the bottles to every visit to confirm accuracy of list.  Return in 6 months or sooner for any questions/problems before then.

## 2020-04-03 ENCOUNTER — Ambulatory Visit: Payer: Medicare Other | Attending: Family | Admitting: Family

## 2020-04-03 ENCOUNTER — Other Ambulatory Visit: Payer: Self-pay

## 2020-04-03 ENCOUNTER — Encounter: Payer: Self-pay | Admitting: Family

## 2020-04-03 VITALS — BP 127/75 | HR 64 | Resp 18 | Ht 70.0 in | Wt 198.4 lb

## 2020-04-03 DIAGNOSIS — I890 Lymphedema, not elsewhere classified: Secondary | ICD-10-CM | POA: Insufficient documentation

## 2020-04-03 DIAGNOSIS — M25551 Pain in right hip: Secondary | ICD-10-CM | POA: Insufficient documentation

## 2020-04-03 DIAGNOSIS — E7800 Pure hypercholesterolemia, unspecified: Secondary | ICD-10-CM | POA: Insufficient documentation

## 2020-04-03 DIAGNOSIS — Z87891 Personal history of nicotine dependence: Secondary | ICD-10-CM | POA: Insufficient documentation

## 2020-04-03 DIAGNOSIS — G479 Sleep disorder, unspecified: Secondary | ICD-10-CM | POA: Diagnosis not present

## 2020-04-03 DIAGNOSIS — I10 Essential (primary) hypertension: Secondary | ICD-10-CM

## 2020-04-03 DIAGNOSIS — I2510 Atherosclerotic heart disease of native coronary artery without angina pectoris: Secondary | ICD-10-CM | POA: Insufficient documentation

## 2020-04-03 DIAGNOSIS — I110 Hypertensive heart disease with heart failure: Secondary | ICD-10-CM | POA: Insufficient documentation

## 2020-04-03 DIAGNOSIS — Z7982 Long term (current) use of aspirin: Secondary | ICD-10-CM | POA: Insufficient documentation

## 2020-04-03 DIAGNOSIS — M25552 Pain in left hip: Secondary | ICD-10-CM | POA: Diagnosis not present

## 2020-04-03 DIAGNOSIS — Z7901 Long term (current) use of anticoagulants: Secondary | ICD-10-CM | POA: Insufficient documentation

## 2020-04-03 DIAGNOSIS — Z79899 Other long term (current) drug therapy: Secondary | ICD-10-CM | POA: Insufficient documentation

## 2020-04-03 DIAGNOSIS — G7000 Myasthenia gravis without (acute) exacerbation: Secondary | ICD-10-CM | POA: Insufficient documentation

## 2020-04-03 DIAGNOSIS — I252 Old myocardial infarction: Secondary | ICD-10-CM | POA: Diagnosis not present

## 2020-04-03 DIAGNOSIS — I5022 Chronic systolic (congestive) heart failure: Secondary | ICD-10-CM

## 2020-04-03 DIAGNOSIS — E785 Hyperlipidemia, unspecified: Secondary | ICD-10-CM | POA: Diagnosis not present

## 2020-04-03 DIAGNOSIS — G7080 Lambert-Eaton syndrome, unspecified: Secondary | ICD-10-CM | POA: Insufficient documentation

## 2020-04-03 DIAGNOSIS — R0602 Shortness of breath: Secondary | ICD-10-CM | POA: Insufficient documentation

## 2020-04-03 MED ORDER — FUROSEMIDE 40 MG PO TABS: 40.0000 mg | ORAL_TABLET | Freq: Every day | ORAL | 3 refills | Status: DC

## 2020-04-03 NOTE — Patient Instructions (Signed)
Continue weighing daily and call for an overnight weight gain of > 2 pounds or a weekly weight gain of >5 pounds. 

## 2020-04-23 ENCOUNTER — Encounter: Payer: Self-pay | Admitting: Nurse Practitioner

## 2020-04-23 ENCOUNTER — Ambulatory Visit: Payer: Medicare Other | Admitting: Nurse Practitioner

## 2020-04-23 ENCOUNTER — Other Ambulatory Visit: Payer: Self-pay

## 2020-04-23 VITALS — BP 122/60 | HR 63 | Temp 98.3°F | Ht 70.0 in | Wt 198.0 lb

## 2020-04-23 DIAGNOSIS — L89312 Pressure ulcer of right buttock, stage 2: Secondary | ICD-10-CM | POA: Diagnosis not present

## 2020-04-23 NOTE — Progress Notes (Signed)
Established Patient Office Visit  Subjective:  Patient ID: [name], male    DOB: [birth year]  Age: 86 y.o. MRN: [id number]  CC:  Chief Complaint  Patient presents with  . Acute Visit    sore on buttock    HPI [name] is a 86 yo who presents for a burning sore spot on his buttock that he has been treating for 2 weeks with nystatin cream and diclofenac 1% gel and neither has helped. He saw a stain spot on his underwear, so he knows that it oozed a little blood and clear yellow fluid. No injury or trauma. He does not sit for long periods.   He performs all of his ADLs independently, drives, and active during the day. He has normal formed bowel movements and no diarrhea.  Past Medical History:  Diagnosis Date  . Abnormal chest CT 03/28/2017  . Allergy   . Anemia   . Angina pectoris ([place]) 04/02/2011  . Aortic aneurysm ([place]) 03/28/2017   Saw Dr Genevive Bi 04/16/17 - recommended f/u CT in 3 months.    . Atherosclerosis of both carotid arteries 07/17/2014  . Benign lipomatous neoplasm of skin and subcutaneous tissue of left arm 10/04/2013  . Bilateral carotid artery stenosis 08/30/2014   Overview:  Less than 50% 2015  . Bladder cancer ([place]) 03/05/2013  . CAD (coronary artery disease) 06/18/2014  . Cancer ([place]) 11-24-12   bladder. BCG treatments by Dr Jacqlyn Larsen.  . Chicken pox   . Chronic prostatitis 09/30/2012  . Colon polyp   . Congestive heart failure ([place]) 04/20/2014   Overview:  Overview:  With anterior mi and moderate lv dyfunction ef 35%  . Dizziness 12/22/2016  . 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[place]) 08/30/2012  . Eaton-Lambert syndrome ([place])   . Elevated prostate specific antigen (PSA) 09/30/2012  . Essential hypertension, benign   . Gross hematuria 09/30/2012  . Herpes zoster 11/28/2011   Overview:  with ocular involvement OD  . Hypercholesterolemia   . Hypertension, benign 04/02/2011  . Hypotension   . Moderate mitral insufficiency 05/18/2015  . Moderate tricuspid insufficiency  05/18/2015  . Myasthenia gravis ([place])   . Myasthenia gravis without exacerbation ([place]) 03/31/2011  . Shingles 2013    Past Surgical History:  Procedure Laterality Date  . APPENDECTOMY  1958  . CHOLECYSTECTOMY  1979  . HERNIA REPAIR  1970  . TONSILLECTOMY  1958    Family History  Problem Relation Age of Onset  . Lung cancer Sister   . Prostate cancer Brother   . Lung cancer Brother   . Arthritis Other        parent  . Colon cancer Neg Hx     Social History   Socioeconomic History  . Marital status: Married    Spouse name: Not on file  . Number of children: Not on file  . Years of education: Not on file  . Highest education level: Not on file  Occupational History  . Not on file  Tobacco Use  . Smoking status: Former Smoker    Quit date: 09/29/1958    Years since quitting: 61.6  . Smokeless tobacco: Never Used  Vaping Use  . Vaping Use: Never used  Substance and Sexual Activity  . Alcohol use: Yes    Alcohol/week: 0.0 standard drinks    Comment: occasionally  . Drug use: No  . Sexual activity: Never  Other Topics Concern  . Not on file  Social History Narrative   Pt is  married with 2 children - 1 son, 1 daughter. Previously self-employed in [employer]   Social Determinants of Health   Financial Resource Strain: [place]   . Difficulty of Paying Living Expenses: Not hard at all  Food Insecurity: No Food Insecurity  . Worried About Charity fundraiser in the Last Year: Never true  . Ran Out of Food in the Last Year: Never true  Transportation Needs: No Transportation Needs  . Lack of Transportation (Medical): No  . Lack of Transportation (Non-Medical): No  Physical Activity:   . Days of Exercise per Week:   . Minutes of Exercise per Session:   Stress: No Stress Concern Present  . Feeling of Stress : Not at all  Social Connections:   . Frequency of Communication with Friends and Family:   . Frequency of Social Gatherings with Friends and Family:   .  Attends Religious Services:   . Active Member of Clubs or Organizations:   . Attends Archivist Meetings:   Marland Kitchen Marital Status:   Intimate Partner Violence: Not At Risk  . Fear of Current or Ex-Partner: No  . Emotionally Abused: No  . Physically Abused: No  . Sexually Abused: No    Outpatient Medications Prior to Visit  Medication Sig Dispense Refill  . aspirin 81 MG tablet Take 81 mg by mouth daily.    . finasteride (PROSCAR) 5 MG tablet Take 5 mg by mouth daily.    . furosemide (LASIX) 40 MG tablet Take 1 tablet (40 mg total) by mouth daily. 90 tablet 3  . hydroxypropyl methylcellulose / hypromellose (ISOPTO TEARS / GONIOVISC) 2.5 % ophthalmic solution Place 1 drop into both eyes daily.    Marland Kitchen lisinopril (ZESTRIL) 5 MG tablet Hold until outpatient followup since your blood pressure was on the low side in the hospital.    . mycophenolate (CELLCEPT) 250 MG capsule Take 1,000 mg by mouth 2 (two) times daily.     . pravastatin (PRAVACHOL) 20 MG tablet Take 1 tablet (20 mg total) by mouth daily. 90 tablet 1  . prednisoLONE acetate (PRED FORTE) 1 % ophthalmic suspension Place 1 drop into the right eye daily. Per patient. 5 mL 0  . Probiotic Product (ALIGN) 4 MG CAPS One capsule q day 30 capsule 0  . pyridostigmine (MESTINON) 60 MG tablet Take 30 mg by mouth 6 (six) times daily.     . tamsulosin (FLOMAX) 0.4 MG CAPS Take 0.4 mg by mouth daily.    Marland Kitchen acetaminophen (TYLENOL) 500 MG tablet Take 500 mg by mouth every 6 (six) hours as needed.     No facility-administered medications prior to visit.    Allergies  Allergen Reactions  . Beta Adrenergic Blockers Other (See Comments)    Beta Blocker will precipitate acute weakness in Lambert-Eaton Syndrome or Myasthenia Gravis  . Calcium Channel Blockers Other (See Comments)    Calcium Channel Blocker- will precipitate acute weakness in Lambert-Eaton Syndrome.  . Ciprofloxacin Other (See Comments)    Last resort due to Myasthia Gravis  .  Sulfa Antibiotics Rash    Review of Systems  Constitutional: Negative for chills and fever.  HENT: Negative.   Respiratory: Negative.   Cardiovascular: Negative.   Gastrointestinal: Negative for abdominal pain, anal bleeding, blood in stool, constipation, diarrhea and rectal pain.  Endocrine: Negative.   Genitourinary: Negative.   Musculoskeletal: Negative.   Skin: Positive for wound.  Neurological: Negative for dizziness.  Psychiatric/Behavioral: Negative.  Objective:    Physical Exam Vitals reviewed.  Constitutional:      Appearance: Normal appearance.  Cardiovascular:     Rate and Rhythm: Normal rate and regular rhythm.  Pulmonary:     Effort: Pulmonary effort is normal.     Breath sounds: Normal breath sounds.  Abdominal:     Palpations: Abdomen is soft.     Tenderness: There is no abdominal tenderness.  Genitourinary:    Rectum: Normal. No anal fissure or external hemorrhoid.     Comments: Right buttock with a dime sized decubitus- stage 2 - and no drainage or redness. He has ealthy, granulated tissue exposed with the dermis layer removed. The left buttock cheek- has a red spot where the two cheeks touch and friction occurs. No breakdown on the left buttock.  Neurological:     Mental Status: He is alert.   -near   BP (!) 122/60 (BP Location: Left Arm, Patient Position: Sitting, Cuff Size: Normal)   Pulse 63   Temp 98.3 F (36.8 C) (Oral)   Ht 5\' 10"  (1.778 m)   Wt 198 lb (89.8 kg)   SpO2 98%   BMI 28.41 kg/m  Wt Readings from Last 3 Encounters:  04/23/20 198 lb (89.8 kg)  04/03/20 198 lb 6 oz (90 kg)  02/23/20 198 lb 2 oz (89.9 kg)     Health Maintenance Due  Topic Date Due  . TETANUS/TDAP  Never done    There are no preventive care reminders to display for this patient.  Lab Results  Component Value Date   TSH 2.65 02/13/2020   Lab Results  Component Value Date   WBC 5.3 02/13/2020   HGB 13.7 02/13/2020   HCT 42.4 02/13/2020   MCV  90.2 02/13/2020   PLT 161.0 02/13/2020   Lab Results  Component Value Date   NA 140 02/13/2020   K 4.8 02/13/2020   CO2 35 (H) 02/13/2020   GLUCOSE 111 (H) 02/13/2020   BUN 25 (H) 02/13/2020   CREATININE 1.07 02/13/2020   BILITOT 0.7 02/13/2020   ALKPHOS 66 02/13/2020   AST 16 02/13/2020   ALT 10 02/13/2020   PROT 6.2 02/13/2020   ALBUMIN 4.2 02/13/2020   CALCIUM 9.2 02/13/2020   ANIONGAP 8 01/15/2020   GFR 64.72 02/13/2020   Lab Results  Component Value Date   CHOL 165 02/13/2020   Lab Results  Component Value Date   HDL 52.10 02/13/2020   Lab Results  Component Value Date   LDLCALC 98 02/13/2020   Lab Results  Component Value Date   TRIG 75.0 02/13/2020   Lab Results  Component Value Date   CHOLHDL 3 02/13/2020   Lab Results  Component Value Date   HGBA1C 6.1 02/13/2020      Assessment & Plan:   Problem List Items Addressed This Visit      Musculoskeletal and Integument   Pressure injury of right buttock, stage 2 ([place]) - Primary   Relevant Orders   Ambulatory referral to Wound Clinic     Try to apply a Telfa dressing and secure with paper tape to keep the sore clean and dry and protected. Change it every day and if soiled.   Sit a different way to take the pressure off of this sore.   I have placed a wound care consult and you will be called about an appointment.   Buy  Premier protein drinks and drink one per day. Take a multi vitamin to aid healing.  No orders of the defined types were placed in this encounter.  Addendum: Case d/w Dr. Derrel Nip who worked has experience in the  [facility] in the past. She recommends a trial of Dermacloud cream to keep the decubitus covered since Telfa dressing is very difficult to apply to this region.  Follow-up: Return in about 2 weeks (around 05/07/2020).  This visit occurred during the SARS-CoV-2 public health emergency.  Safety protocols were in place, including screening questions prior to the visit,  additional usage of staff PPE, and extensive cleaning of exam room while observing appropriate contact time as indicated for disinfecting solutions.    Denice Paradise, NP

## 2020-04-23 NOTE — Patient Instructions (Addendum)
Try to apply a Telfa dressing and secure with paper tape to keep the sore clean and dry and protected. Change it every day and if soiled.   Sit a different way to take the pressure off of this sore.   I have placed a wound care consult and you will be called about an appointment.    Buy  Premier protein drinks and drink one per day. Take a multi vitamin to aid healing.   Preventing Pressure Injuries  A pressure injury, sometimes called a bedsore or a pressure ulcer, is an injury to the skin and underlying tissue caused by pressure. A pressure injury can happen when your skin presses against a surface, such as a mattress or wheelchair seat, for too long. The pressure on the blood vessels causes reduced blood flow to your skin. This can eventually cause the skin tissue to die and break down into a wound. Pressure injuries usually develop:  Over bony parts of the body, such as the tailbone, shoulders, elbows, hips, and heels.  Under medical devices, such as respiratory equipment, stockings, tubes, and splints. How can this condition affect me? Pressure injuries are caused by a lack of blood supply to an area of skin. These injuries begin as a reddened area on the skin and can become an open sore. They can result from intense pressure over a short period of time or from less pressure over a long period of time. Pressure injuries can vary in severity. They can cause pain, muscle damage, and infection. What can increase my risk? This condition is more likely to develop in people who:  Are in the hospital or an extended care facility.  Are bedridden or in a wheelchair.  Have an injury or disease that keeps them from: ? Moving normally. ? Feeling pain or pressure. ? Communicating if they feel pain or pressure.  Have a condition that: ? Makes them sleepy or less alert. ? Causes poor blood flow.  Need to wear a medical device.  Have poor control of their bladder or bowel functions  (incontinence).  Have poor nutrition (malnutrition).  Have had this condition before.  Are of certain ethnicities. People of African American, Latino, or Hispanic descent are at higher risk compared to other ethnic groups. What actions can I take to prevent pressure injuries? Reducing and redistributing pressure  Do not lie or sit in one position for a long time. Move or change position: ? Every hour when out of bed in a chair. ? Every two hours when in bed. ? As often as told by your health care provider.  Use pillows, wedges, or cushions to redistribute pressure. Ask your health care provider to recommend a mattress, cushions, or pads for you.  Use medical devices that do not rub your skin. Tell your health care provider if one of your medical devices is causing pain or irritation. Skin care If you are in the hospital, your health care providers:  Will inspect your skin, including areas under or around medical devices, at least twice a day.  May recommend that you use certain types of bedding to help prevent pressure injuries. These may include a pad, mattress, or chair cushion that is filled with gel, air, water, or foam.  Will evaluate your nutrition and consult a dietitian if needed.  Will inspect and change any wound dressings regularly.  May help you move into different positions every few hours.  Will adjust any medical devices and braces as needed to limit  pressure on your skin.  Will keep your skin clean and dry.  May use gentle cleansers and skin protectants if you are incontinent.  Will moisturize any dry skin. In general, at home:  Keep your skin clean and dry. Gently pat your skin dry.  Do not rub or massage bony areas of your skin.  Moisturize dry skin.  Use gentle cleansers and skin protectants routinely if you are incontinent.  Check your skin at least once a day for any changes in color and for any new blisters or sores. Make sure to check under and  around any medical devices and between skin folds. Have a caregiver do this for you if you are not able.  Lifestyle  Be as active as you can every day. Ask your health care provider to suggest safe exercises or activities.  Do not abuse drugs or alcohol.  Do not use any products that contain nicotine or tobacco, such as cigarettes, e-cigarettes, and chewing tobacco. If you need help quitting, ask your health care provider. General instructions   Take over-the-counter and prescription medicines only as told by your health care provider.  Work with your health care provider to manage any chronic health conditions.  Eat a healthy diet that includes protein, vitamins, and minerals. Ask your health care provider what types of food you should eat.  Drink enough fluid to keep your urine pale yellow.  Keep all follow-up visits as told by your health care provider. This is important. Contact a health care provider if you:  Feel or see any changes in your skin. Summary  A pressure injury, sometimes called a bedsore or a pressure ulcer, is an injury to the skin and underlying tissue caused by pressure.  Do not lie or sit in one position for a long time.  Check your skin at least once a day for any changes in color and for any new blisters or sores.  Make sure to check under and around any medical devices and between skin folds. Have a caregiver do this for you if you are not able.  Eat a healthy diet that includes protein, vitamins, and minerals. Ask your health care provider what types of food you should eat. This information is not intended to replace advice given to you by your health care provider. Make sure you discuss any questions you have with your health care provider. Document Revised: 01/07/2019 Document Reviewed: 06/08/2018 [employer] Patient Education  [employer].

## 2020-04-24 ENCOUNTER — Encounter: Payer: Medicare Other | Attending: Physician Assistant | Admitting: Physician Assistant

## 2020-04-24 DIAGNOSIS — I252 Old myocardial infarction: Secondary | ICD-10-CM | POA: Diagnosis not present

## 2020-04-24 DIAGNOSIS — Z87891 Personal history of nicotine dependence: Secondary | ICD-10-CM | POA: Diagnosis not present

## 2020-04-24 DIAGNOSIS — G7000 Myasthenia gravis without (acute) exacerbation: Secondary | ICD-10-CM | POA: Insufficient documentation

## 2020-04-24 DIAGNOSIS — Z8349 Family history of other endocrine, nutritional and metabolic diseases: Secondary | ICD-10-CM | POA: Insufficient documentation

## 2020-04-24 DIAGNOSIS — I110 Hypertensive heart disease with heart failure: Secondary | ICD-10-CM | POA: Insufficient documentation

## 2020-04-24 DIAGNOSIS — Z820 Family history of epilepsy and other diseases of the nervous system: Secondary | ICD-10-CM | POA: Diagnosis not present

## 2020-04-24 DIAGNOSIS — M1990 Unspecified osteoarthritis, unspecified site: Secondary | ICD-10-CM | POA: Insufficient documentation

## 2020-04-24 DIAGNOSIS — Z809 Family history of malignant neoplasm, unspecified: Secondary | ICD-10-CM | POA: Insufficient documentation

## 2020-04-24 DIAGNOSIS — Z882 Allergy status to sulfonamides status: Secondary | ICD-10-CM | POA: Diagnosis not present

## 2020-04-24 DIAGNOSIS — L89313 Pressure ulcer of right buttock, stage 3: Secondary | ICD-10-CM | POA: Insufficient documentation

## 2020-04-24 DIAGNOSIS — L89312 Pressure ulcer of right buttock, stage 2: Secondary | ICD-10-CM | POA: Insufficient documentation

## 2020-04-24 DIAGNOSIS — I5042 Chronic combined systolic (congestive) and diastolic (congestive) heart failure: Secondary | ICD-10-CM | POA: Diagnosis not present

## 2020-04-24 DIAGNOSIS — Z8249 Family history of ischemic heart disease and other diseases of the circulatory system: Secondary | ICD-10-CM | POA: Diagnosis not present

## 2020-04-24 DIAGNOSIS — Z9049 Acquired absence of other specified parts of digestive tract: Secondary | ICD-10-CM | POA: Insufficient documentation

## 2020-04-24 DIAGNOSIS — Z8551 Personal history of malignant neoplasm of bladder: Secondary | ICD-10-CM | POA: Insufficient documentation

## 2020-04-24 DIAGNOSIS — Z833 Family history of diabetes mellitus: Secondary | ICD-10-CM | POA: Insufficient documentation

## 2020-04-25 ENCOUNTER — Telehealth: Payer: Self-pay | Admitting: Nurse Practitioner

## 2020-04-25 ENCOUNTER — Encounter: Payer: Self-pay | Admitting: Nurse Practitioner

## 2020-04-25 DIAGNOSIS — L89312 Pressure ulcer of right buttock, stage 2: Secondary | ICD-10-CM | POA: Diagnosis not present

## 2020-04-25 MED ORDER — DERMACLOUD EX CREA: TOPICAL_CREAM | CUTANEOUS | 0 refills | Status: DC

## 2020-04-25 NOTE — Progress Notes (Signed)
[name] ([id number]) Visit Report for 04/24/2020 Chief Complaint Document Details Patient Name: [name], [name]. Date of Service: 04/24/2020 12:45 PM Medical Record Number: [id number] Patient Account Number: [id number] Date of Birth/Sex: 06-Feb-1926 (86 y.o. M) Treating RN: Cornell Barman Primary Care Provider: [name] Other Clinician: Referring Provider: [name] Treating Provider/Extender: Melburn Hake, Melinda Pottinger Weeks in Treatment: 0 Information Obtained from: Patient Chief Complaint Right gluteal ulcer Electronic Signature(s) Signed: 04/24/2020 1:30:14 PM By: Worthy Keeler PA-C Entered By: Worthy Keeler on 04/24/2020 13:30:14 [name] ([id number]) -------------------------------------------------------------------------------- Debridement Details Patient Name: [name]. Date of Service: 04/24/2020 12:45 PM Medical Record Number: [id number] Patient Account Number: [id number] Date of Birth/Sex: [birth year] (86 y.o. M) Treating RN: Cornell Barman Primary Care Provider: [name] Other Clinician: Referring Provider: [name] Treating Provider/Extender: Melburn Hake, Clarabel Marion Weeks in Treatment: 0 Debridement Performed for Wound #1 Right,Medial Gluteus Assessment: Performed By: Physician STONE III, Alexes Menchaca E., PA-C Debridement Type: Chemical/Enzymatic/Mechanical Agent Used: saline and gauze Level of Consciousness (Pre- Awake and Alert procedure): Pre-procedure Verification/Time Out Yes - 13:45 Taken: Instrument: Other : saline and gauze Bleeding: None Response to Treatment: Procedure was tolerated well Level of Consciousness (Post- Awake and Alert procedure): Post Debridement Measurements of Total Wound Length: (cm) 0.6 Stage: Category/Stage III Width: (cm) 0.6 Depth: (cm) 0.1 Volume: (cm) 0.028 Character of Wound/Ulcer Post Debridement: Stable Post Procedure Diagnosis Same as Pre-procedure Electronic Signature(s) Signed: 04/24/2020  3:58:26 PM By: Gretta Cool, BSN, RN, CWS, Kim RN, BSN Signed: 04/24/2020 4:38:51 PM By: Worthy Keeler PA-C Entered By: Gretta Cool, BSN, RN, CWS, Kim on 04/24/2020 13:51:23 [name], [name] ([id number]) -------------------------------------------------------------------------------- HPI Details Patient Name: [name]. Date of Service: 04/24/2020 12:45 PM Medical Record Number: [id number] Patient Account Number: [id number] Date of Birth/Sex: 10/14/1925 (86 y.o. M) Treating RN: Cornell Barman Primary Care Provider: [name] Other Clinician: Referring Provider: [name] Treating Provider/Extender: Melburn Hake, Teague Goynes Weeks in Treatment: 0 History of Present Illness HPI Description: 04/24/2020 on evaluation today patient presents for initial evaluation here in the clinic concerning an issue that has been having with the right gluteal region. He tells me currently that this has been present for a couple of weeks. He believes it may have been secondary to having been riding on his tractor a lot and sliding to the side especially when he is on heels. Subsequently this may have been a combination of pressure/friction as far as the injury is concerned. Nonetheless he tells me that it has been burning a lot and draining quite a bit as well on his underclothes. The PA he saw that his primary care provider's office yesterday actually suggested that he use Vaseline over it and cover it which actually is not a terrible idea. Nonetheless there may be some things we can do to help this along a little bit better for him. The patient does have a history of hypertension, congestive heart failure, and myasthenia gravis though he seems to be doing quite well with no exacerbation at this point. Electronic Signature(s) Signed: 04/24/2020 1:57:45 PM By: Worthy Keeler PA-C Entered By: Worthy Keeler on 04/24/2020 13:57:44 [name]  ([id number]) -------------------------------------------------------------------------------- Physical Exam Details Patient Name: [name], [name]. Date of Service: 04/24/2020 12:45 PM Medical Record Number: [id number] Patient Account Number: [id number] Date of Birth/Sex: 04-23-1926 (86 y.o. M) Treating RN: Cornell Barman Primary Care Provider: [name] Other Clinician: Referring Provider: [name] Treating Provider/Extender: Melburn Hake, Lucindia Lemley Weeks in Treatment: 0 Constitutional patient is hypertensive.Marland Kitchen  pulse regular and within target range for patient.Marland Kitchen respirations regular, non-labored and within target range for patient.Marland Kitchen temperature within target range for patient.. Well-nourished and well-hydrated in no acute distress. Eyes conjunctiva clear no eyelid edema noted. pupils equal round and reactive to light and accommodation. Ears, Nose, Mouth, and Throat no gross abnormality of ear auricles or external auditory canals. normal hearing noted during conversation. mucus membranes moist. Respiratory normal breathing without difficulty. Cardiovascular no clubbing, cyanosis, significant edema, <3 sec cap refill. Musculoskeletal normal gait and posture. no significant deformity or arthritic changes, no loss or range of motion, no clubbing. Psychiatric this patient is able to make decisions and demonstrates good insight into disease process. Alert and Oriented x 3. pleasant and cooperative. Notes Upon inspection patient's wound bed actually showed signs of fairly good granulation surface there did not appear showing signs of significant slough buildup. I did actually use saline gauze to debride away any slough on the surface but this appears to be doing fairly well to be honest. There overall is some epithelial growth around the edges of the wound and the patient did not have any severe pain which is also excellent news. Overall I think that he is doing quite well especially since  adding the Vaseline he tells me that he is having less discomfort and pain. Electronic Signature(s) Signed: 04/24/2020 1:59:50 PM By: Worthy Keeler PA-C Entered By: Worthy Keeler on 04/24/2020 13:59:49 [name], [name] ([id number]) -------------------------------------------------------------------------------- Physician Orders Details Patient Name: [name], [name]. Date of Service: 04/24/2020 12:45 PM Medical Record Number: [id number] Patient Account Number: [id number] Date of Birth/Sex: December 04, 1925 (86 y.o. M) Treating RN: Cornell Barman Primary Care Provider: [name] Other Clinician: Referring Provider: [name] Treating Provider/Extender: Melburn Hake, Lafonda Patron Weeks in Treatment: 0 Verbal / Phone Orders: No Diagnosis Coding ICD-10 Coding Code Description F64.332 Pressure ulcer of right buttock, stage 2 I10 Essential (primary) hypertension I50.42 Chronic combined systolic (congestive) and diastolic (congestive) heart failure G70.00 Myasthenia gravis without (acute) exacerbation Wound Cleansing Wound #1 Right,Medial Gluteus o Clean wound with Normal Saline. o May Shower, gently pat wound dry prior to applying new dressing. Primary Wound Dressing Wound #1 Right,Medial Gluteus o Silver Collagen - moistened with saline Secondary Dressing Wound #1 Right,Medial Gluteus o Boardered Foam Dressing Dressing Change Frequency Wound #1 Right,Medial Gluteus o Change Dressing Monday, Wednesday, Friday Follow-up Appointments Wound #1 Right,Medial Gluteus o Return Appointment in 2 weeks. Off-Loading Wound #1 Right,Medial Gluteus o Turn and reposition every 2 hours o Other: - Keep pressure off of area Electronic Signature(s) Signed: 04/24/2020 3:58:26 PM By: Gretta Cool, BSN, RN, CWS, Kim RN, BSN Signed: 04/24/2020 4:38:51 PM By: Worthy Keeler PA-C Entered By: Gretta Cool, BSN, RN, CWS, Kim on 04/24/2020 13:45:28 [name], [name]  ([id number]) -------------------------------------------------------------------------------- Problem List Details Patient Name: [name], [name]. Date of Service: 04/24/2020 12:45 PM Medical Record Number: [id number] Patient Account Number: [id number] Date of Birth/Sex: 12-Jan-1926 (86 y.o. M) Treating RN: Cornell Barman Primary Care Provider: [name] Other Clinician: Referring Provider: [name] Treating Provider/Extender: Melburn Hake, Dionna Wiedemann Weeks in Treatment: 0 Active Problems ICD-10 Encounter Code Description Active Date MDM Diagnosis L89.313 Pressure ulcer of right buttock, stage 3 04/24/2020 No Yes I10 Essential (primary) hypertension 04/24/2020 No Yes I50.42 Chronic combined systolic (congestive) and diastolic (congestive) heart 04/24/2020 No Yes failure G70.00 Myasthenia gravis without (acute) exacerbation 04/24/2020 No Yes Inactive Problems Resolved Problems Electronic Signature(s) Signed: 04/24/2020 1:51:18 PM By: Worthy Keeler PA-C Previous Signature: 04/24/2020 1:30:01 PM Version By:  Melburn Hake, Shiann Kam PA-C Entered By: Worthy Keeler on 04/24/2020 13:51:18 [name] ([id number]) -------------------------------------------------------------------------------- Progress Note Details Patient Name: [name], [name]. Date of Service: 04/24/2020 12:45 PM Medical Record Number: [id number] Patient Account Number: [id number] Date of Birth/Sex: 1926-09-23 (86 y.o. M) Treating RN: Cornell Barman Primary Care Provider: [name] Other Clinician: Referring Provider: [name] Treating Provider/Extender: Melburn Hake, Cicely Ortner Weeks in Treatment: 0 Subjective Chief Complaint Information obtained from Patient Right gluteal ulcer History of Present Illness (HPI) 04/24/2020 on evaluation today patient presents for initial evaluation here in the clinic concerning an issue that has been having with the right gluteal region. He tells me currently that this has been present for  a couple of weeks. He believes it may have been secondary to having been riding on his tractor a lot and sliding to the side especially when he is on heels. Subsequently this may have been a combination of pressure/friction as far as the injury is concerned. Nonetheless he tells me that it has been burning a lot and draining quite a bit as well on his underclothes. The PA he saw that his primary care provider's office yesterday actually suggested that he use Vaseline over it and cover it which actually is not a terrible idea. Nonetheless there may be some things we can do to help this along a little bit better for him. The patient does have a history of hypertension, congestive heart failure, and myasthenia gravis though he seems to be doing quite well with no exacerbation at this point. Patient History Information obtained from Patient. Allergies sulfa (Reaction: rash) Family History Cancer - Father, Diabetes - Siblings, Heart Disease - Siblings, Hypertension - Siblings, Kidney Disease - Siblings, Seizures - Child, Thyroid Problems - Child, No family history of Hereditary Spherocytosis, Lung Disease, Stroke, Tuberculosis. Social History Former smoker - quit 40 yrs ago, Marital Status - Widowed, Alcohol Use - Rarely, Drug Use - No History, Caffeine Use - Daily - coffee. Medical History Eyes Patient has history of Cataracts - removed 2 yrs ago Denies history of Glaucoma, Optic Neuritis Ear/Nose/Mouth/Throat Denies history of Chronic sinus problems/congestion, Middle ear problems Hematologic/Lymphatic Denies history of Anemia, Hemophilia, Human Immunodeficiency Virus, Lymphedema, Sickle Cell Disease Respiratory Denies history of Aspiration, Asthma, Chronic Obstructive Pulmonary Disease (COPD), Pneumothorax, Sleep Apnea, Tuberculosis Cardiovascular Patient has history of Congestive Heart Failure, Hypertension, Myocardial Infarction, Peripheral Venous Disease Denies history of Angina,  Arrhythmia, Coronary Artery Disease, Deep Vein Thrombosis, Peripheral Arterial Disease, Phlebitis, Vasculitis Gastrointestinal Denies history of Cirrhosis , Colitis, Crohn s, Hepatitis A, Hepatitis B, Hepatitis C Endocrine Denies history of Type I Diabetes, Type II Diabetes Genitourinary Denies history of End Stage Renal Disease Immunological Denies history of Lupus Erythematosus, Raynaud s, Scleroderma Integumentary (Skin) Denies history of History of Burn, History of pressure wounds Musculoskeletal Patient has history of Osteoarthritis Denies history of Gout, Rheumatoid Arthritis, Osteomyelitis Neurologic Denies history of Dementia, Neuropathy, Quadriplegia, Paraplegia, Seizure Disorder Psychiatric Denies history of Anorexia/bulimia, Confinement Anxiety Hospitalization/Surgery History - tonsillectomy. - appendectomy. Medical And Surgical History Notes Oncologic DESMOND, SZABO ([id number]) bladder cancer 6 yrs ago Review of Systems (ROS) Constitutional Symptoms ([facility]) Denies complaints or symptoms of Fatigue, Fever, Chills, Marked Weight Change. Eyes Complains or has symptoms of Glasses / Contacts, loss of vision in right eye Ear/Nose/Mouth/Throat Denies complaints or symptoms of Difficult clearing ears, Sinusitis, 10% hearing loss Hematologic/Lymphatic Denies complaints or symptoms of Bleeding / Clotting Disorders, Human Immunodeficiency Virus. Respiratory Denies complaints or symptoms of Chronic or  frequent coughs, Shortness of Breath. Cardiovascular Complains or has symptoms of LE edema - bilateral but mainly in the left LE now. Denies complaints or symptoms of Chest pain. Gastrointestinal Denies complaints or symptoms of Frequent diarrhea, Nausea, Vomiting. Endocrine Denies complaints or symptoms of Hepatitis, Thyroid disease, Polydypsia (Excessive Thirst). Genitourinary Complains or has symptoms of Incontinence/dribbling. Immunological Denies complaints  or symptoms of Hives, Itching. Integumentary (Skin) Complains or has symptoms of Wounds. Musculoskeletal Denies complaints or symptoms of Muscle Pain, Muscle Weakness. Neurologic Denies complaints or symptoms of Numbness/parasthesias, Focal/Weakness, eaton lambert myasthenic syndrome, goes to [facility] for this Psychiatric Denies complaints or symptoms of Anxiety, Claustrophobia. Objective Constitutional patient is hypertensive.. pulse regular and within target range for patient.Marland Kitchen respirations regular, non-labored and within target range for patient.Marland Kitchen temperature within target range for patient.. Well-nourished and well-hydrated in no acute distress. Vitals Time Taken: 12:55 PM, Height: 70 in, Source: Stated, Weight: 196 lbs, Source: Measured, BMI: 28.1, Temperature: 97.6 F, Pulse: 58 bpm, Respiratory Rate: 16 breaths/min, Blood Pressure: 144/60 mmHg. Eyes conjunctiva clear no eyelid edema noted. pupils equal round and reactive to light and accommodation. Ears, Nose, Mouth, and Throat no gross abnormality of ear auricles or external auditory canals. normal hearing noted during conversation. mucus membranes moist. Respiratory normal breathing without difficulty. Cardiovascular no clubbing, cyanosis, significant edema, Musculoskeletal normal gait and posture. no significant deformity or arthritic changes, no loss or range of motion, no clubbing. Psychiatric this patient is able to make decisions and demonstrates good insight into disease process. Alert and Oriented x 3. pleasant and cooperative. General Notes: Upon inspection patient's wound bed actually showed signs of fairly good granulation surface there did not appear showing signs of significant slough buildup. I did actually use saline gauze to debride away any slough on the surface but this appears to be doing fairly well to be honest. There overall is some epithelial growth around the edges of the wound and the patient did not  have any severe pain which is also excellent news. Overall I think that he is doing quite well especially since adding the Vaseline he tells me that he is having less discomfort and pain. Integumentary (Hair, Skin) Wound #1 status is Open. Original cause of wound was Gradually Appeared. The wound is located on the Right,Medial Gluteus. The wound measures 0.6cm length x 0.6cm width x 0.1cm depth; 0.283cm^2 area and 0.028cm^3 volume. There is Fat Layer (Subcutaneous Tissue) [name], [name]. ([id number]) Exposed exposed. There is no tunneling or undermining noted. There is a medium amount of serous drainage noted. The wound margin is distinct with the outline attached to the wound base. There is small (1-33%) red granulation within the wound bed. There is a medium (34-66%) amount of necrotic tissue within the wound bed including Adherent Slough. Assessment Active Problems ICD-10 Pressure ulcer of right buttock, stage 3 Essential (primary) hypertension Chronic combined systolic (congestive) and diastolic (congestive) heart failure Myasthenia gravis without (acute) exacerbation Procedures Wound #1 Pre-procedure diagnosis of Wound #1 is a Pressure Ulcer located on the Right,Medial Gluteus . There was a Chemical/Enzymatic/Mechanical debridement performed by STONE III, Aslynn Brunetti E., PA-C. With the following instrument(s): saline and gauze. Other agent used was saline and gauze. A time out was conducted at 13:45, prior to the start of the procedure. There was no bleeding. The procedure was tolerated well. Post Debridement Measurements: 0.6cm length x 0.6cm width x 0.1cm depth; 0.028cm^3 volume. Post debridement Stage noted as Category/Stage III. Character of Wound/Ulcer Post Debridement is stable. Post  procedure Diagnosis Wound #1: Same as Pre-Procedure Plan Wound Cleansing: Wound #1 Right,Medial Gluteus: Clean wound with Normal Saline. May Shower, gently pat wound dry prior to applying new  dressing. Primary Wound Dressing: Wound #1 Right,Medial Gluteus: Silver Collagen - moistened with saline Secondary Dressing: Wound #1 Right,Medial Gluteus: Boardered Foam Dressing Dressing Change Frequency: Wound #1 Right,Medial Gluteus: Change Dressing Monday, Wednesday, Friday Follow-up Appointments: Wound #1 Right,Medial Gluteus: Return Appointment in 2 weeks. Off-Loading: Wound #1 Right,Medial Gluteus: Turn and reposition every 2 hours Other: - Keep pressure off of area 1. I would recommend currently that we initiate treatment with a silver collagen dressing I think this is a good option for him to try to help with additional and more rapid epithelial growth. 2. I am also can I suggest that we go ahead and continue to utilize a border foam dressing to cover this will provide both some cushioning as well as protection to the wound which hopefully should help it feel better as well as heal more appropriately and quickly. 3. I would also recommend the patient turn and reposition as far as sitting is concerned I did recommend potentially memory foam pillow although I think that this could be done in the way of even a small PET bed that he can use on his tractor mainly it sounds like the tractor is the biggest issue that he has as far as friction and pressure is concerned. We will see patient back for reevaluation in 2 weeks here in the clinic. If anything worsens or changes patient will contact our office for additional recommendations. [name], [name] ([id number]) Electronic Signature(s) Signed: 04/24/2020 2:00:52 PM By: Worthy Keeler PA-C Entered By: Worthy Keeler on 04/24/2020 14:00:51 [name], [name] ([id number]) -------------------------------------------------------------------------------- ROS/PFSH Details Patient Name: DAGOBERTO, NEALY. Date of Service: 04/24/2020 12:45 PM Medical Record Number: [id number] Patient Account Number: [id number] Date of Birth/Sex: 24-Apr-1926  (86 y.o. M) Treating RN: Grover Canavan Primary Care Provider: [name] Other Clinician: Referring Provider: [name] Treating Provider/Extender: Melburn Hake, Trinidee Schrag Weeks in Treatment: 0 Information Obtained From Patient Constitutional Symptoms (General Health) Complaints and Symptoms: Negative for: Fatigue; Fever; Chills; Marked Weight Change Eyes Complaints and Symptoms: Positive for: Glasses / Contacts Review of System Notes: loss of vision in right eye Medical History: Positive for: Cataracts - removed 2 yrs ago Negative for: Glaucoma; Optic Neuritis Ear/Nose/Mouth/Throat Complaints and Symptoms: Negative for: Difficult clearing ears; Sinusitis Review of System Notes: 10% hearing loss Medical History: Negative for: Chronic sinus problems/congestion; Middle ear problems Hematologic/Lymphatic Complaints and Symptoms: Negative for: Bleeding / Clotting Disorders; Human Immunodeficiency Virus Medical History: Negative for: Anemia; Hemophilia; Human Immunodeficiency Virus; Lymphedema; Sickle Cell Disease Respiratory Complaints and Symptoms: Negative for: Chronic or frequent coughs; Shortness of Breath Medical History: Negative for: Aspiration; Asthma; Chronic Obstructive Pulmonary Disease (COPD); Pneumothorax; Sleep Apnea; Tuberculosis Cardiovascular Complaints and Symptoms: Positive for: LE edema - bilateral but mainly in the left LE now Negative for: Chest pain Medical History: Positive for: Congestive Heart Failure; Hypertension; Myocardial Infarction; Peripheral Venous Disease Negative for: Angina; Arrhythmia; Coronary Artery Disease; Deep Vein Thrombosis; Peripheral Arterial Disease; Phlebitis; Vasculitis Gastrointestinal Complaints and Symptoms: Negative for: Frequent diarrhea; Nausea; Vomiting Medical HistoryMarland Kitchen DAXTON, NYDAM ([id number]) Negative for: Cirrhosis ; Colitis; Crohnos; Hepatitis A; Hepatitis B; Hepatitis C Endocrine Complaints and  Symptoms: Negative for: Hepatitis; Thyroid disease; Polydypsia (Excessive Thirst) Medical History: Negative for: Type I Diabetes; Type II Diabetes Genitourinary Complaints and Symptoms: Positive for: Incontinence/dribbling Medical History: Negative for: End Stage  Renal Disease Immunological Complaints and Symptoms: Negative for: Hives; Itching Medical History: Negative for: Lupus Erythematosus; Raynaudos; Scleroderma Integumentary (Skin) Complaints and Symptoms: Positive for: Wounds Medical History: Negative for: History of Burn; History of pressure wounds Musculoskeletal Complaints and Symptoms: Negative for: Muscle Pain; Muscle Weakness Medical History: Positive for: Osteoarthritis Negative for: Gout; Rheumatoid Arthritis; Osteomyelitis Neurologic Complaints and Symptoms: Negative for: Numbness/parasthesias; Focal/Weakness Review of System Notes: eaton lambert myasthenic syndrome, goes to [facility] this Medical History: Negative for: Dementia; Neuropathy; Quadriplegia; Paraplegia; Seizure Disorder Psychiatric Complaints and Symptoms: Negative for: Anxiety; Claustrophobia Medical History: Negative for: Anorexia/bulimia; Confinement Anxiety Oncologic Medical History: Past Medical History Notes: bladder cancer 6 yrs ago HBO Extended History Items Eyes: Cataracts LEVON, BOETTCHER ([id number]) Immunizations Pneumococcal Vaccine: Received Pneumococcal Vaccination: Yes Immunization Notes: tetanus up to date per pt Implantable Devices None Hospitalization / Surgery History Type of Hospitalization/Surgery tonsillectomy appendectomy Family and Social History Cancer: Yes - Father; Diabetes: Yes - Siblings; Heart Disease: Yes - Siblings; Hereditary Spherocytosis: No; Hypertension: Yes - Siblings; Kidney Disease: Yes - Siblings; Lung Disease: No; Seizures: Yes - Child; Stroke: No; Thyroid Problems: Yes - Child; Tuberculosis: No; Former smoker - quit 40 yrs ago;  Marital Status - Widowed; Alcohol Use: Rarely; Drug Use: No History; Caffeine Use: Daily - coffee; Financial Concerns: No; Food, Clothing or Shelter Needs: No; Support System Lacking: No; Transportation Concerns: No Electronic Signature(s) Signed: 04/24/2020 4:38:51 PM By: Worthy Keeler PA-C Signed: 04/25/2020 4:16:31 PM By: Grover Canavan Entered By: Grover Canavan on 04/24/2020 13:20:13 ALPHONS, BURGERT ([id number]) -------------------------------------------------------------------------------- SuperBill Details Patient Name: [name]. Date of Service: 04/24/2020 Medical Record Number: [id number] Patient Account Number: [id number] Date of Birth/Sex: [birth year] (86 y.o. 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 Pressure ulcer of right buttock, stage 3 I10 Essential (primary) hypertension I50.42 Chronic combined systolic (congestive) and diastolic (congestive) heart failure G70.00 Myasthenia gravis without (acute) exacerbation Facility Procedures CPT4 Code: [id number] Description: 99213 - WOUND CARE VISIT-LEV 3 EST PT Modifier: Quantity: 1 Physician Procedures CPT4 Code: [id number] Description: WC PHYS LEVEL 3 o NEW PT Modifier: Quantity: 1 CPT4 Code: Description: ICD-10 Diagnosis Description L89.313 Pressure ulcer of right buttock, stage 3 I10 Essential (primary) hypertension I50.42 Chronic combined systolic (congestive) and diastolic (congestive) he O59.29 Myasthenia gravis without (acute)  exacerbation Modifier: art failure Quantity: Electronic Signature(s) Signed: 04/24/2020 2:06:45 PM By: Worthy Keeler PA-C Entered By: Worthy Keeler on 04/24/2020 14:06:45

## 2020-04-25 NOTE — Progress Notes (Signed)
ISA, KOHLENBERG ([id number]) Visit Report for 04/24/2020 Allergy List Details Patient Name: [name]. Date of Service: 04/24/2020 12:45 PM Medical Record Number: [id number] Patient Account Number: [id number] Date of Birth/Sex: 09/03/1926 (86 y.o. M) Treating RN: Grover Canavan Primary Care Daralyn Bert: [name] Other Clinician: Referring Lamarcus Spira: [name] Treating Catalino Plascencia/Extender: Melburn Hake, HOYT Weeks in Treatment: 0 Allergies Active Allergies sulfa Reaction: rash Allergy Notes Electronic Signature(s) Signed: 04/25/2020 4:16:31 PM By: Grover Canavan Entered By: Grover Canavan on 04/24/2020 13:21:38 [name] ([id number]) -------------------------------------------------------------------------------- Arrival Information Details Patient Name: [name], [name]. Date of Service: 04/24/2020 12:45 PM Medical Record Number: [id number] Patient Account Number: [id number] Date of Birth/Sex: 02/01/1926 (86 y.o. M) Treating RN: Cornell Barman Primary Care Tab Rylee: [name] Other Clinician: Referring Nehan Flaum: [name] Treating Indy Prestwood/Extender: Melburn Hake, HOYT Weeks in Treatment: 0 Visit Information Patient Arrived: Ambulatory Arrival Time: 12:57 Accompanied By: self Transfer Assistance: None Patient Identification Verified: Yes Secondary Verification Process Completed: Yes Electronic Signature(s) Signed: 04/24/2020 4:34:59 PM By: Lorine Bears RCP, RRT, CHT Entered By: Lorine Bears on 04/24/2020 12:57:46 [name] ([id number]) -------------------------------------------------------------------------------- Clinic Level of Care Assessment Details Patient Name: [name], [name]. Date of Service: 04/24/2020 12:45 PM Medical Record Number: [id number] Patient Account Number: [id number] Date of Birth/Sex: 1926/08/02 (86 y.o. M) Treating RN: Cornell Barman Primary Care Shelaine Frie: [name] Other  Clinician: Referring Errin Whitelaw: [name] Treating Arsalan Brisbin/Extender: Melburn Hake, HOYT Weeks in Treatment: 0 Clinic Level of Care Assessment Items TOOL 4 Quantity Score []  - Use when only an EandM is performed on FOLLOW-UP visit 0 ASSESSMENTS - Nursing Assessment / Reassessment X - Reassessment of Co-morbidities (includes updates in patient status) 1 10 X- 1 5 Reassessment of Adherence to Treatment Plan ASSESSMENTS - Wound and Skin Assessment / Reassessment X - Simple Wound Assessment / Reassessment - one wound 1 5 []  - 0 Complex Wound Assessment / Reassessment - multiple wounds []  - 0 Dermatologic / Skin Assessment (not related to wound area) ASSESSMENTS - Focused Assessment []  - Circumferential Edema Measurements - multi extremities 0 []  - 0 Nutritional Assessment / Counseling / Intervention []  - 0 Lower Extremity Assessment (monofilament, tuning fork, pulses) []  - 0 Peripheral Arterial Disease Assessment (using hand held doppler) ASSESSMENTS - Ostomy and/or Continence Assessment and Care []  - Incontinence Assessment and Management 0 []  - 0 Ostomy Care Assessment and Management (repouching, etc.) PROCESS - Coordination of Care X - Simple Patient / Family Education for ongoing care 1 15 []  - 0 Complex (extensive) Patient / Family Education for ongoing care []  - 0 Staff obtains Programmer, systems, Records, Test Results / Process Orders []  - 0 Staff telephones HHA, Nursing Homes / Clarify orders / etc []  - 0 Routine Transfer to another Facility (non-emergent condition) []  - 0 Routine Hospital Admission (non-emergent condition) X- 1 15 New Admissions / Biomedical engineer / Ordering NPWT, Apligraf, etc. []  - 0 Emergency Hospital Admission (emergent condition) X- 1 10 Simple Discharge Coordination []  - 0 Complex (extensive) Discharge Coordination PROCESS - Special Needs []  - Pediatric / Minor Patient Management 0 []  - 0 Isolation Patient Management []  - 0 Hearing /  Language / Visual special needs []  - 0 Assessment of Community assistance (transportation, D/C planning, etc.) []  - 0 Additional assistance / Altered mentation []  - 0 Support Surface(s) Assessment (bed, cushion, seat, etc.) INTERVENTIONS - Wound Cleansing / Measurement [name], Joel D. ([id number]) X- 1 5 Simple Wound Cleansing - one wound []  - 0 Complex Wound Cleansing -  multiple wounds X- 1 5 Wound Imaging (photographs - any number of wounds) []  - 0 Wound Tracing (instead of photographs) X- 1 5 Simple Wound Measurement - one wound []  - 0 Complex Wound Measurement - multiple wounds INTERVENTIONS - Wound Dressings X - Small Wound Dressing one or multiple wounds 1 10 []  - 0 Medium Wound Dressing one or multiple wounds []  - 0 Large Wound Dressing one or multiple wounds []  - 0 Application of Medications - topical []  - 0 Application of Medications - injection INTERVENTIONS - Miscellaneous []  - External ear exam 0 []  - 0 Specimen Collection (cultures, biopsies, blood, body fluids, etc.) []  - 0 Specimen(s) / Culture(s) sent or taken to Lab for analysis []  - 0 Patient Transfer (multiple staff / Civil Service fast streamer / Similar devices) []  - 0 Simple Staple / Suture removal (25 or less) []  - 0 Complex Staple / Suture removal (26 or more) []  - 0 Hypo / Hyperglycemic Management (close monitor of Blood Glucose) []  - 0 Ankle / Brachial Index (ABI) - do not check if billed separately X- 1 5 Vital Signs Has the patient been seen at the hospital within the last three years: Yes Total Score: 90 Level Of Care: New/Established - Level 3 Electronic Signature(s) Signed: 04/24/2020 3:58:26 PM By: Gretta Cool, BSN, RN, CWS, Kim RN, BSN Entered By: Gretta Cool, BSN, RN, CWS, Kim on 04/24/2020 13:46:31 [name] ([id number]) -------------------------------------------------------------------------------- Encounter Discharge Information Details Patient Name: [name]. Date of Service:  04/24/2020 12:45 PM Medical Record Number: [id number] Patient Account Number: [id number] Date of Birth/Sex: 1926/07/11 (86 y.o. M) Treating RN: Cornell Barman Primary Care Kirat Mezquita: [name] Other Clinician: Referring Rylee Huestis: [name] Treating Eulamae Greenstein/Extender: Melburn Hake, HOYT Weeks in Treatment: 0 Encounter Discharge Information Items Post Procedure Vitals Discharge Condition: Stable Temperature (F): 97.6 Ambulatory Status: Ambulatory Pulse (bpm): 58 Discharge Destination: Home Respiratory Rate (breaths/min): 16 Transportation: Private Auto Blood Pressure (mmHg): 144/60 Accompanied By: self Schedule Follow-up Appointment: No Clinical Summary of Care: Electronic Signature(s) Signed: 04/24/2020 3:58:26 PM By: Gretta Cool, BSN, RN, CWS, Kim RN, BSN Entered By: Gretta Cool, BSN, RN, CWS, Kim on 04/24/2020 13:54:56 [name] ([id number]) -------------------------------------------------------------------------------- Lower Extremity Assessment Details Patient Name: [name], [name]. Date of Service: 04/24/2020 12:45 PM Medical Record Number: [id number] Patient Account Number: [id number] Date of Birth/Sex: Jan 13, 1926 (86 y.o. M) Treating RN: Grover Canavan Primary Care Jersey Ravenscroft: [name] Other Clinician: Referring Karlton Maya: [name] Treating Scout Gumbs/Extender: Melburn Hake, HOYT Weeks in Treatment: 0 Electronic Signature(s) Signed: 04/25/2020 4:16:31 PM By: Grover Canavan Entered By: Grover Canavan on 04/24/2020 13:24:59 TYREK, LAWHORN ([id number]) -------------------------------------------------------------------------------- Multi Wound Chart Details Patient Name: [name], [name]. Date of Service: 04/24/2020 12:45 PM Medical Record Number: [id number] Patient Account Number: [id number] Date of Birth/Sex: April 04, 1926 (86 y.o. M) Treating RN: Cornell Barman Primary Care Zoella Roberti: [name] Other Clinician: Referring Melonee Gerstel: [name] Treating  Vertis Scheib/Extender: Melburn Hake, HOYT Weeks in Treatment: 0 Vital Signs Height(in): 70 Pulse(bpm): 65 Weight(lbs): 196 Blood Pressure(mmHg): 144/60 Body Mass Index(BMI): 28 Temperature(F): 97.6 Respiratory Rate(breaths/min): 16 Photos: [1:No Photos] [N/A:N/A] Wound Location: [1:Right, Medial Gluteus] [N/A:N/A] Wounding Event: [1:Gradually Appeared] [N/A:N/A] Primary Etiology: [1:Pressure Ulcer] [N/A:N/A] Comorbid History: [1:Cataracts, Congestive Heart Failure, N/A Hypertension, Myocardial Infarction, Peripheral Venous Disease, Osteoarthritis] Date Acquired: [1:04/10/2020] [N/A:N/A] Weeks of Treatment: [1:0] [N/A:N/A] Wound Status: [1:Open] [N/A:N/A] Measurements L x W x D (cm) [1:0.6x0.6x0.1] [N/A:N/A] Area (cm) : [1:0.283] [N/A:N/A] Volume (cm) : [1:0.028] [N/A:N/A] Classification: [1:Category/Stage II] [N/A:N/A] Exudate Amount: [1:Medium] [N/A:N/A] Exudate Type: [1:Serous] [N/A:N/A] Exudate  Color: [1:amber] [N/A:N/A] Wound Margin: [1:Distinct, outline attached] [N/A:N/A] Granulation Amount: [1:Small (1-33%)] [N/A:N/A] Granulation Quality: [1:Red] [N/A:N/A] Necrotic Amount: [1:Medium (34-66%)] [N/A:N/A] Exposed Structures: [1:Fat Layer (Subcutaneous Tissue) Exposed: Yes Fascia: No Tendon: No Muscle: No Joint: No Bone: No Small (1-33%)] [N/A:N/A N/A] Treatment Notes Electronic Signature(s) Signed: 04/24/2020 3:58:26 PM By: Gretta Cool, BSN, RN, CWS, Kim RN, BSN Entered By: Gretta Cool, BSN, RN, CWS, Kim on 04/24/2020 13:42:45 [name] ([id number]) -------------------------------------------------------------------------------- [place] Details Patient Name: [name], [name]. Date of Service: 04/24/2020 12:45 PM Medical Record Number: [id number] Patient Account Number: [id number] Date of Birth/Sex: [birth year] (86 y.o. M) Treating RN: Cornell Barman Primary Care Lakea Mittelman: [name] Other Clinician: Referring Ashkan Chamberland: [name] Treating  Drelyn Pistilli/Extender: Melburn Hake, HOYT Weeks in Treatment: 0 Active Inactive Orientation to the Wound Care Program Nursing Diagnoses: Knowledge deficit related to the wound healing center program Goals: Patient/caregiver will verbalize understanding of the [place] Program Date Initiated: 04/24/2020 Target Resolution Date: 04/30/2020 Goal Status: Active Interventions: Provide education on orientation to the wound center Notes: Pressure Nursing Diagnoses: Knowledge deficit related to management of pressures ulcers Goals: Patient will remain free from development of additional pressure ulcers Date Initiated: 04/24/2020 Target Resolution Date: 05/01/2020 Goal Status: Active Patient will remain free of pressure ulcers Date Initiated: 04/24/2020 Target Resolution Date: 05/01/2020 Goal Status: Active Patient/caregiver will verbalize risk factors for pressure ulcer development Date Initiated: 04/24/2020 Target Resolution Date: 05/01/2020 Goal Status: Active Patient/caregiver will verbalize understanding of pressure ulcer management Date Initiated: 04/24/2020 Target Resolution Date: 05/01/2020 Goal Status: Active Interventions: Assess: immobility, friction, shearing, incontinence upon admission and as needed Notes: Wound/Skin Impairment Nursing Diagnoses: Impaired tissue integrity Goals: Ulcer/skin breakdown will have a volume reduction of 30% by week 4 Date Initiated: 04/24/2020 Target Resolution Date: 05/25/2020 Goal Status: Active Interventions: Assess ulceration(s) every visit Treatment Activities: Skin care regimen initiated : 04/24/2020 EAMES, DIBIASIO ([id number]) Topical wound management initiated : 04/24/2020 Notes: Electronic Signature(s) Signed: 04/24/2020 3:58:26 PM By: Gretta Cool, BSN, RN, CWS, Kim RN, BSN Entered By: Gretta Cool, BSN, RN, CWS, Kim on 04/24/2020 13:42:23 [name], [name]  ([id number]) -------------------------------------------------------------------------------- Pain Assessment Details Patient Name: [name], [name]. Date of Service: 04/24/2020 12:45 PM Medical Record Number: [id number] Patient Account Number: [id number] Date of Birth/Sex: 1926-08-15 (86 y.o. M) Treating RN: Cornell Barman Primary Care Herminio Kniskern: [name] Other Clinician: Referring Dariush Mcnellis: [name] Treating Remingtyn Depaola/Extender: Melburn Hake, HOYT Weeks in Treatment: 0 Active Problems Location of Pain Severity and Description of Pain Patient Has Paino No Site Locations Pain Management and Medication Current Pain Management: Electronic Signature(s) Signed: 04/24/2020 3:58:26 PM By: Gretta Cool, BSN, RN, CWS, Kim RN, BSN Signed: 04/24/2020 4:34:59 PM By: Lorine Bears RCP, RRT, CHT Entered By: Lorine Bears on 04/24/2020 12:57:58 [name], [name] ([id number]) -------------------------------------------------------------------------------- Wound Assessment Details Patient Name: [name], [name]. Date of Service: 04/24/2020 12:45 PM Medical Record Number: [id number] Patient Account Number: [id number] Date of Birth/Sex: Feb 22, 1926 (86 y.o. M) Treating RN: Grover Canavan Primary Care Ellajane Stong: [name] Other Clinician: Referring Gerrard Crystal: [name] Treating Carime Dinkel/Extender: Melburn Hake, HOYT Weeks in Treatment: 0 Wound Status Wound Number: 1 Primary Pressure Ulcer Etiology: Wound Location: Right, Medial Gluteus Wound Open Wounding Event: Gradually Appeared Status: Date Acquired: 04/10/2020 Comorbid Cataracts, Congestive Heart Failure, Hypertension, Weeks Of Treatment: 0 History: Myocardial Infarction, Peripheral Venous Disease, Clustered Wound: No Osteoarthritis Wound Measurements Length: (cm) 0.6 Width: (cm) 0.6 Depth: (cm) 0.1 Area: (cm) 0.283 Volume: (cm) 0.028 % Reduction in Area: 0% % Reduction in Volume:  0% Epithelialization:  Small (1-33%) Tunneling: No Undermining: No Wound Description Classification: Category/Stage III Wound Margin: Distinct, outline attached Exudate Amount: Medium Exudate Type: Serous Exudate Color: amber Foul Odor After Cleansing: No Slough/Fibrino Yes Wound Bed Granulation Amount: Small (1-33%) Exposed Structure Granulation Quality: Red Fascia Exposed: No Necrotic Amount: Medium (34-66%) Fat Layer (Subcutaneous Tissue) Exposed: Yes Necrotic Quality: Adherent Slough Tendon Exposed: No Muscle Exposed: No Joint Exposed: No Bone Exposed: No Treatment Notes Wound #1 (Right, Medial Gluteus) 1. Cleansed with: Clean wound with Normal Saline 4. Dressing Applied: Prisma Ag 5. Secondary Dressing Applied Bordered Foam Dressing Electronic Signature(s) Signed: 04/24/2020 3:58:26 PM By: Gretta Cool, BSN, RN, CWS, Kim RN, BSN Signed: 04/25/2020 4:16:31 PM By: Grover Canavan Entered By: Gretta Cool BSN, RN, CWS, Kim on 04/24/2020 13:43:41 [name], [name] ([id number]) -------------------------------------------------------------------------------- Vitals Details Patient Name: [name], [name]. Date of Service: 04/24/2020 12:45 PM Medical Record Number: [id number] Patient Account Number: [id number] Date of Birth/Sex: 25-Sep-1926 (86 y.o. M) Treating RN: Cornell Barman Primary Care Joliana Claflin: [name] Other Clinician: Referring Tomas Schamp: [name] Treating Aspynn Clover/Extender: Melburn Hake, HOYT Weeks in Treatment: 0 Vital Signs Time Taken: 12:55 Temperature (F): 97.6 Height (in): 70 Pulse (bpm): 58 Source: Stated Respiratory Rate (breaths/min): 16 Weight (lbs): 196 Blood Pressure (mmHg): 144/60 Source: Measured Reference Range: 80 - 120 mg / dl Body Mass Index (BMI): 28.1 Electronic Signature(s) Signed: 04/24/2020 4:34:59 PM By: Lorine Bears RCP, RRT, CHT Entered By: Becky Sax, Amado Nash on 04/24/2020 12:58:48

## 2020-04-25 NOTE — Telephone Encounter (Signed)
Please call him and advise: A trial of Dermacloud cream to apply 2-3 times a day- enough to keep the decubitus covered. Stop the  Telfa dressing since it is very difficult to apply to this region. Dermacloud cream was ordered at his pharmacy.

## 2020-04-25 NOTE — Telephone Encounter (Signed)
Patient aware and will pick up cream today

## 2020-04-25 NOTE — Progress Notes (Signed)
CHRISTERPHER, CLOS ([id number]) Visit Report for 04/24/2020 Abuse/Suicide Risk Screen Details Patient Name: [name], [name]. Date of Service: 04/24/2020 12:45 PM Medical Record Number: [id number] Patient Account Number: [id number] Date of Birth/Sex: [birth year] (86 y.o. M) Treating RN: Grover Canavan Primary Care Emine Lopata: [name] Other Clinician: Referring Philana Younis: [name] Treating Jaizon Deroos/Extender: Melburn Hake, HOYT Weeks in Treatment: 0 Abuse/Suicide Risk Screen Items Answer ABUSE RISK SCREEN: Has anyone close to you tried to hurt or harm you recentlyo No Do you feel uncomfortable with anyone in your familyo No Has anyone forced you do things that you didnot want to doo No Electronic Signature(s) Signed: 04/25/2020 4:16:31 PM By: Grover Canavan Entered By: Grover Canavan on 04/24/2020 13:12:21 [name] ([id number]) -------------------------------------------------------------------------------- Activities of Daily Living Details Patient Name: [name], [name]. Date of Service: 04/24/2020 12:45 PM Medical Record Number: [id number] Patient Account Number: [id number] Date of Birth/Sex: [birth year] (86 y.o. M) Treating RN: Grover Canavan Primary Care Ericha Whittingham: [name] Other Clinician: Referring Nadalee Neiswender: [name] Treating Rylynne Schicker/Extender: Melburn Hake, HOYT Weeks in Treatment: 0 Activities of Daily Living Items Answer Activities of Daily Living (Please select one for each item) Drive Automobile Completely Able Take Medications Completely Able Use Telephone Completely Able Care for Appearance Completely Able Use Toilet Completely Able Bath / Shower Completely Able Dress Self Completely Able Feed Self Completely Able Walk Completely Able Get In / Out Bed Completely Able Housework Completely Able Prepare Meals Completely [place] for Self Completely Able Electronic Signature(s) Signed: 04/25/2020 4:16:31 PM  By: Grover Canavan Entered By: Grover Canavan on 04/24/2020 13:12:50 [name] ([id number]) -------------------------------------------------------------------------------- Education Screening Details Patient Name: [name], [name]. Date of Service: 04/24/2020 12:45 PM Medical Record Number: [id number] Patient Account Number: [id number] Date of Birth/Sex: Aug 07, 1926 (86 y.o. M) Treating RN: Grover Canavan Primary Care Finnis Colee: [name] Other Clinician: Referring Chinyere Galiano: [name] Treating Harli Engelken/Extender: Sharalyn Ink in Treatment: 0 Learning Preferences/Education Level/Primary Language Learning Preference: Explanation, Demonstration, Printed Material Highest Education Level: College or Above Preferred Language: English Cognitive Barrier Language Barrier: No Translator Needed: No Memory Deficit: No Emotional Barrier: No Cultural/Religious Beliefs Affecting Medical Care: No Physical Barrier Impaired Vision: Yes Glasses Impaired Hearing: No Decreased Hand dexterity: Yes Knowledge/Comprehension Knowledge Level: High Comprehension Level: High Ability to understand written instructions: High Ability to understand verbal instructions: High Motivation Anxiety Level: Calm Cooperation: Cooperative Education Importance: Acknowledges Need Interest in Health Problems: Asks Questions Perception: Coherent Willingness to Engage in Self-Management High Activities: Readiness to Engage in Self-Management High Activities: Electronic Signature(s) Signed: 04/25/2020 4:16:31 PM By: Grover Canavan Entered By: Grover Canavan on 04/24/2020 13:13:36 DUNG, SALINGER ([id number]) -------------------------------------------------------------------------------- Fall Risk Assessment Details Patient Name: [name]. Date of Service: 04/24/2020 12:45 PM Medical Record Number: [id number] Patient Account Number: [id number] Date of Birth/Sex: 11-29-1925  (86 y.o. M) Treating RN: Grover Canavan Primary Care Otoniel Myhand: [name] Other Clinician: Referring Kamsiyochukwu Buist: [name] Treating Keyari Kleeman/Extender: Melburn Hake, HOYT Weeks in Treatment: 0 Fall Risk Assessment Items Have you had 2 or more falls in the last 12 monthso 0 No Have you had any fall that resulted in injury in the last 12 monthso 0 No FALLS RISK SCREEN History of falling - immediate or within 3 months 0 No Secondary diagnosis (Do you have 2 or more medical diagnoseso) 0 No Ambulatory aid None/bed rest/wheelchair/nurse 0 No Crutches/cane/walker 0 No Furniture 0 No Intravenous therapy Access/Saline/Heparin Lock 0 No Gait/Transferring Normal/ bed rest/ wheelchair 0 No Weak (short steps  with or without shuffle, stooped but able to lift head while walking, may 0 No seek support from furniture) Impaired (short steps with shuffle, may have difficulty arising from chair, head down, impaired 0 No balance) Mental Status Oriented to own ability 0 No Electronic Signature(s) Signed: 04/25/2020 4:16:31 PM By: Grover Canavan Entered By: Grover Canavan on 04/24/2020 13:13:47 [name] ([id number]) -------------------------------------------------------------------------------- Foot Assessment Details Patient Name: [name], [name]. Date of Service: 04/24/2020 12:45 PM Medical Record Number: [id number] Patient Account Number: [id number] Date of Birth/Sex: Feb 08, 1926 (86 y.o. M) Treating RN: Grover Canavan Primary Care Deaunna Olarte: [name] Other Clinician: Referring Gurshan Settlemire: [name] Treating Jesseca Marsch/Extender: Melburn Hake, HOYT Weeks in Treatment: 0 Foot Assessment Items Site Locations + = Sensation present, - = Sensation absent, C = Callus, U = Ulcer R = Redness, W = Warmth, M = Maceration, PU = Pre-ulcerative lesion F = Fissure, S = Swelling, D = Dryness Assessment Right: Left: Other Deformity: No No Prior Foot Ulcer: No No Prior  Amputation: No No Charcot Joint: No No Ambulatory Status: Ambulatory Without Help Gait: Steady Electronic Signature(s) Signed: 04/25/2020 4:16:31 PM By: Grover Canavan Entered By: Grover Canavan on 04/24/2020 13:14:22 [name] ([id number]) -------------------------------------------------------------------------------- Nutrition Risk Screening Details Patient Name: [name], [name]. Date of Service: 04/24/2020 12:45 PM Medical Record Number: [id number] Patient Account Number: [id number] Date of Birth/Sex: 1926-03-17 (86 y.o. M) Treating RN: Grover Canavan Primary Care Lavoy Bernards: [name] Other Clinician: Referring Alvis Edgell: [name] Treating Taiyo Kozma/Extender: Melburn Hake, HOYT Weeks in Treatment: 0 Height (in): 70 Weight (lbs): 196 Body Mass Index (BMI): 28.1 Nutrition Risk Screening Items Score Screening NUTRITION RISK SCREEN: I have an illness or condition that made me change the kind and/or amount of food I eat 0 No I eat fewer than two meals per day 0 No I eat few fruits and vegetables, or milk products 0 No I have three or more drinks of beer, liquor or wine almost every day 0 No I have tooth or mouth problems that make it hard for me to eat 0 No I don't always have enough money to buy the food I need 0 No I eat alone most of the time 0 No I take three or more different prescribed or over-the-counter drugs a day 0 No Without wanting to, I have lost or gained 10 pounds in the last six months 0 No I am not always physically able to shop, cook and/or feed myself 0 No Nutrition Protocols Good Risk Protocol 0 No interventions needed Moderate Risk Protocol High Risk Proctocol Risk Level: Good Risk Score: 0 Electronic Signature(s) Signed: 04/25/2020 4:16:31 PM By: Grover Canavan Entered By: Grover Canavan on 04/24/2020 13:14:07

## 2020-05-01 ENCOUNTER — Encounter: Payer: Self-pay | Admitting: Internal Medicine

## 2020-05-01 ENCOUNTER — Ambulatory Visit: Payer: Medicare Other | Admitting: Internal Medicine

## 2020-05-01 ENCOUNTER — Other Ambulatory Visit: Payer: Self-pay

## 2020-05-01 DIAGNOSIS — I5022 Chronic systolic (congestive) heart failure: Secondary | ICD-10-CM

## 2020-05-01 DIAGNOSIS — Z8551 Personal history of malignant neoplasm of bladder: Secondary | ICD-10-CM

## 2020-05-01 DIAGNOSIS — G7000 Myasthenia gravis without (acute) exacerbation: Secondary | ICD-10-CM

## 2020-05-01 DIAGNOSIS — I712 Thoracic aortic aneurysm, without rupture, unspecified: Secondary | ICD-10-CM

## 2020-05-01 DIAGNOSIS — R739 Hyperglycemia, unspecified: Secondary | ICD-10-CM

## 2020-05-01 DIAGNOSIS — L89312 Pressure ulcer of right buttock, stage 2: Secondary | ICD-10-CM | POA: Diagnosis not present

## 2020-05-01 DIAGNOSIS — I10 Essential (primary) hypertension: Secondary | ICD-10-CM | POA: Diagnosis not present

## 2020-05-01 DIAGNOSIS — L989 Disorder of the skin and subcutaneous tissue, unspecified: Secondary | ICD-10-CM

## 2020-05-01 DIAGNOSIS — G7080 Lambert-Eaton syndrome, unspecified: Secondary | ICD-10-CM

## 2020-05-01 DIAGNOSIS — I872 Venous insufficiency (chronic) (peripheral): Secondary | ICD-10-CM

## 2020-05-01 DIAGNOSIS — E7800 Pure hypercholesterolemia, unspecified: Secondary | ICD-10-CM

## 2020-05-01 DIAGNOSIS — I2510 Atherosclerotic heart disease of native coronary artery without angina pectoris: Secondary | ICD-10-CM

## 2020-05-01 NOTE — Progress Notes (Addendum)
Patient ID: [name], male   DOB: [birth year], 86 y.o.   MRN: [id number]   Subjective:    Patient ID: [name], male    DOB: 1926/08/06, 86 y.o.   MRN: [id number]  HPI This visit occurred during the SARS-CoV-2 public health emergency.  Safety protocols were in place, including screening questions prior to the visit, additional usage of staff PPE, and extensive cleaning of exam room while observing appropriate contact time as indicated for disinfecting solutions.  Patient here for a scheduled follow up. Was evaluated by Dawson Bills 04/23/20 with pressure sore - right buttock.  Right gluteal lesion - much improved.  Using dermacloud.  Wants to continue with this regimen since improving.  No fever.  No headache.  No chest pain or tightness.  Breathing stable.  No acid reflux.  No abdominal pain.  Bowels moving.  Has skin lesion - facial/post ear.  Request dermatology referral.   Past Medical History:  Diagnosis Date  . Abnormal chest CT 03/28/2017  . Allergy   . Anemia   . Angina pectoris ([place]) 04/02/2011  . Aortic aneurysm ([place]) 03/28/2017   Saw Dr Genevive Bi 04/16/17 - recommended f/u CT in 3 months.    . Atherosclerosis of both carotid arteries 07/17/2014  . Benign lipomatous neoplasm of skin and subcutaneous tissue of left arm 10/04/2013  . Bilateral carotid artery stenosis 08/30/2014   Overview:  Less than 50% 2015  . Bladder cancer ([place]) 03/05/2013  . CAD (coronary artery disease) 06/18/2014  . Cancer ([place]) 11-24-12   bladder. BCG treatments by Dr Jacqlyn Larsen.  . Chicken pox   . Chronic prostatitis 09/30/2012  . Colon polyp   . Congestive heart failure ([place]) 04/20/2014   Overview:  Overview:  With anterior mi and moderate lv dyfunction ef 35%  . Dizziness 12/22/2016  . 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 Elevated prostate specific antigen (PSA) 09/30/2012  . Essential hypertension, benign   . Gross hematuria 09/30/2012  . Herpes zoster 11/28/2011    Overview:  with ocular involvement OD  . Hypercholesterolemia   . Hypertension, benign 04/02/2011  . Hypotension   . Moderate mitral insufficiency 05/18/2015  . Moderate tricuspid insufficiency 05/18/2015  . Myasthenia gravis ([place])   . Myasthenia gravis without exacerbation ([place]) 03/31/2011  . Shingles 2013   Past Surgical History:  Procedure Laterality Date  . APPENDECTOMY  1958  . CHOLECYSTECTOMY  1979  . HERNIA REPAIR  1970  . TONSILLECTOMY  1958   Family History  Problem Relation Age of Onset  . Lung cancer Sister   . Prostate cancer Brother   . Lung cancer Brother   . Arthritis Other        parent  . Colon cancer Neg Hx    Social History   Socioeconomic History  . Marital status: Married    Spouse name: Not on file  . Number of children: Not on file  . Years of education: Not on file  . Highest education level: Not on file  Occupational History  . Not on file  Tobacco Use  . Smoking status: Former Smoker    Quit date: 09/29/1958    Years since quitting: 61.6  . Smokeless tobacco: Never Used  Vaping Use  . Vaping Use: Never used  Substance and Sexual Activity  . Alcohol use: Yes    Alcohol/week: 0.0 standard drinks    Comment: occasionally  . Drug use: No  . Sexual activity:  Never  Other Topics Concern  . Not on file  Social History Narrative   Pt is married with 2 children - 1 son, 1 daughter. Previously self-employed in [employer]   Social Determinants of Health   Financial Resource Strain:   . Difficulty of Paying Living Expenses: Not on file  Food Insecurity:   . Worried About Charity fundraiser in the Last Year: Not on file  . Ran Out of Food in the Last Year: Not on file  Transportation Needs:   . Lack of Transportation (Medical): Not on file  . Lack of Transportation (Non-Medical): Not on file  Physical Activity:   . Days of Exercise per Week: Not on file  . Minutes of Exercise per Session: Not on file  Stress:   . Feeling of Stress : Not  on file  Social Connections:   . Frequency of Communication with Friends and Family: Not on file  . Frequency of Social Gatherings with Friends and Family: Not on file  . Attends Religious Services: Not on file  . Active Member of Clubs or Organizations: Not on file  . Attends Archivist Meetings: Not on file  . Marital Status: Not on file    Outpatient Encounter Medications as of 05/01/2020  Medication Sig  . aspirin 81 MG tablet Take 81 mg by mouth daily.  . finasteride (PROSCAR) 5 MG tablet Take 5 mg by mouth daily.  . furosemide (LASIX) 40 MG tablet Take 1 tablet (40 mg total) by mouth daily.  . hydroxypropyl methylcellulose / hypromellose (ISOPTO TEARS / GONIOVISC) 2.5 % ophthalmic solution Place 1 drop into both eyes daily.  . [place] (DERMACLOUD) CREA Apply a small amount with a Q tip to the buttock decubitus 2-3 times a day as needed until healed.  Marland Kitchen lisinopril (ZESTRIL) 5 MG tablet Hold until outpatient followup since your blood pressure was on the low side in the hospital.  . mycophenolate (CELLCEPT) 250 MG capsule Take 1,000 mg by mouth 2 (two) times daily.   . pravastatin (PRAVACHOL) 20 MG tablet Take 1 tablet (20 mg total) by mouth daily.  . prednisoLONE acetate (PRED FORTE) 1 % ophthalmic suspension Place 1 drop into the right eye daily. Per patient.  . Probiotic Product (ALIGN) 4 MG CAPS One capsule q day  . pyridostigmine (MESTINON) 60 MG tablet Take 30 mg by mouth 6 (six) times daily.   . tamsulosin (FLOMAX) 0.4 MG CAPS Take 0.4 mg by mouth daily.   No facility-administered encounter medications on file as of 05/01/2020.    Review of Systems  Constitutional: Negative for appetite change and unexpected weight change.  HENT: Negative for congestion and sinus pressure.   Respiratory: Negative for cough, chest tightness and shortness of breath.   Cardiovascular: Negative for chest pain, palpitations and leg swelling.  Gastrointestinal: Negative for  abdominal pain, diarrhea, nausea and vomiting.  Genitourinary: Negative for difficulty urinating and dysuria.  Musculoskeletal: Negative for joint swelling and myalgias.  Skin: Negative for color change and rash.  Neurological: Negative for dizziness, light-headedness and headaches.  Psychiatric/Behavioral: Negative for agitation and dysphoric mood.       Objective:    Physical Exam Vitals reviewed.  Constitutional:      General: He is not in acute distress.    Appearance: Normal appearance. He is well-developed.  HENT:     Head: Normocephalic and atraumatic.     Right Ear: External ear normal.     Left  Ear: External ear normal.  Eyes:     General: No scleral icterus.       Right eye: No discharge.        Left eye: No discharge.     Conjunctiva/sclera: Conjunctivae normal.  Cardiovascular:     Rate and Rhythm: Normal rate and regular rhythm.  Pulmonary:     Effort: Pulmonary effort is normal. No respiratory distress.     Breath sounds: Normal breath sounds.  Abdominal:     General: Bowel sounds are normal.     Palpations: Abdomen is soft.     Tenderness: There is no abdominal tenderness.  Musculoskeletal:        General: No swelling or tenderness.     Cervical back: Neck supple. No tenderness.  Lymphadenopathy:     Cervical: No cervical adenopathy.  Skin:    Findings: No erythema or rash.  Neurological:     Mental Status: He is alert.  Psychiatric:        Mood and Affect: Mood normal.        Behavior: Behavior normal.     BP 128/68   Pulse 71   Temp (!) 97.4 F (36.3 C)   Resp 16   Ht '5\' 10"'$  (1.778 m)   Wt 202 lb (91.6 kg)   SpO2 96%   BMI 28.98 kg/m  Wt Readings from Last 3 Encounters:  05/01/20 202 lb (91.6 kg)  04/23/20 198 lb (89.8 kg)  04/03/20 198 lb 6 oz (90 kg)     Lab Results  Component Value Date   WBC 5.3 02/13/2020   HGB 13.7 02/13/2020   HCT 42.4 02/13/2020   PLT 161.0 02/13/2020   GLUCOSE 111 (H) 02/13/2020   CHOL 165 02/13/2020     TRIG 75.0 02/13/2020   HDL 52.10 02/13/2020   LDLDIRECT 143.5 08/26/2013   LDLCALC 98 02/13/2020   ALT 10 02/13/2020   AST 16 02/13/2020   NA 140 02/13/2020   K 4.8 02/13/2020   CL 101 02/13/2020   CREATININE 1.07 02/13/2020   BUN 25 (H) 02/13/2020   CO2 35 (H) 02/13/2020   TSH 2.65 02/13/2020   HGBA1C 6.1 02/13/2020    ECHOCARDIOGRAM COMPLETE  Result Date: 01/16/2020    ECHOCARDIOGRAM REPORT   Patient Name:   [name] Date of Exam: 01/15/2020 Medical Rec #:  [id number]       Height:       70.0 in Accession #:    [id number]      Weight:       201.3 lb Date of Birth:  1926-09-24       BSA:          2.093 m Patient Age:    68 years        BP:           110/66 mmHg Patient Gender: M               HR:           65 bpm. Exam Location:  [facility] Procedure: 2D Echo and Intracardiac Opacification Agent Indications:     Dyspnea  History:         Patient has no prior history of Echocardiogram examinations.                  CHF, Previous Myocardial Infarction and CAD; Risk                  Factors:Hypertension.  Sonographer:  K Thornton-Maynard Referring Phys:  [id number] Otila Kluver LAI Diagnosing Phys: Yolonda Kida MD IMPRESSIONS  1. Ant/Apical/Septal hypo.  2. Left ventricular ejection fraction, by estimation, is 30 to 35%. The left ventricle has moderately decreased function. The left ventricle demonstrates regional wall motion abnormalities (see scoring diagram/findings for description). The left ventricular internal cavity size was mildly dilated. Left ventricular diastolic parameters are consistent with Grade III diastolic dysfunction (restrictive).  3. Right ventricular systolic function is moderately reduced. The right ventricular size is moderately enlarged. Mildly increased right ventricular wall thickness. There is moderately elevated pulmonary artery systolic pressure.  4. Left atrial size was mildly dilated.  5. Right atrial size was mildly dilated.  6. The mitral valve is grossly normal. Mild  mitral valve regurgitation.  7. Tricuspid valve regurgitation is moderate.  8. The aortic valve is grossly normal. Aortic valve regurgitation is not visualized. FINDINGS  Left Ventricle: Left ventricular ejection fraction, by estimation, is 30 to 35%. The left ventricle has moderately decreased function. The left ventricle demonstrates regional wall motion abnormalities. Definity contrast agent was given IV to delineate the left ventricular endocardial borders. The left ventricular internal cavity size was mildly dilated. There is no left ventricular hypertrophy. Left ventricular diastolic parameters are consistent with Grade III diastolic dysfunction (restrictive).  LV Wall Scoring: The entire apex is hypokinetic. Right Ventricle: The right ventricular size is moderately enlarged. Mildly increased right ventricular wall thickness. Right ventricular systolic function is moderately reduced. There is moderately elevated pulmonary artery systolic pressure. The tricuspid regurgitant velocity is 3.65 m/s, and with an assumed right atrial pressure of 10 mmHg, the estimated right ventricular systolic pressure is 78.2 mmHg. Left Atrium: Left atrial size was mildly dilated. Right Atrium: Right atrial size was mildly dilated. Pericardium: There is no evidence of pericardial effusion. Mitral Valve: The mitral valve is grossly normal. Mild mitral valve regurgitation. Tricuspid Valve: The tricuspid valve is grossly normal. Tricuspid valve regurgitation is moderate. Aortic Valve: The aortic valve is grossly normal. Aortic valve regurgitation is not visualized. Aortic valve mean gradient measures 11.0 mmHg. Aortic valve peak gradient measures 21.5 mmHg. Aortic valve area, by VTI measures 1.34 cm. Pulmonic Valve: The pulmonic valve was grossly normal. Pulmonic valve regurgitation is not visualized. Aorta: The aortic root is normal in size and structure. IAS/Shunts: No atrial level shunt detected by color flow Doppler. Additional  Comments: Ant/Apical/Septal hypo.  LEFT VENTRICLE PLAX 2D LVIDd:         5.42 cm      Diastology LVIDs:         4.53 cm      LV e' lateral:   4.12 cm/s LV PW:         1.14 cm      LV E/e' lateral: 16.2 LV IVS:        1.34 cm      LV e' medial:    3.70 cm/s LVOT diam:     2.30 cm      LV E/e' medial:  18.1 LV SV:         67 LV SV Index:   32 LVOT Area:     4.15 cm  LV Volumes (MOD) LV vol d, MOD A2C: 142.0 ml LV vol d, MOD A4C: 183.0 ml LV vol s, MOD A2C: 122.0 ml LV vol s, MOD A4C: 128.0 ml LV SV MOD A2C:     20.0 ml LV SV MOD A4C:     183.0 ml LV SV MOD BP:  31.2 ml RIGHT VENTRICLE RV S prime:     15.50 cm/s TAPSE (M-mode): 2.7 cm LEFT ATRIUM             Index LA diam:        3.90 cm 1.86 cm/m LA Vol (A2C):   71.0 ml 33.92 ml/m LA Vol (A4C):   80.9 ml 38.65 ml/m LA Biplane Vol: 75.6 ml 36.12 ml/m  AORTIC VALVE AV Area (Vmax):    1.23 cm AV Area (Vmean):   1.31 cm AV Area (VTI):     1.34 cm AV Vmax:           232.00 cm/s AV Vmean:          158.000 cm/s AV VTI:            0.499 m AV Peak Grad:      21.5 mmHg AV Mean Grad:      11.0 mmHg LVOT Vmax:         68.70 cm/s LVOT Vmean:        49.900 cm/s LVOT VTI:          0.161 m LVOT/AV VTI ratio: 0.32  AORTA Ao Root diam: 4.00 cm MITRAL VALVE                TRICUSPID VALVE MV Area (PHT): 3.49 cm     TR Peak grad:   53.3 mmHg MV E velocity: 66.90 cm/s   TR Vmax:        365.00 cm/s MV A velocity: 122.00 cm/s MV E/A ratio:  0.55         SHUNTS                             Systemic VTI:  0.16 m                             Systemic Diam: 2.30 cm Dwayne Prince [place] MD Electronically signed by Yolonda Kida MD Signature Date/Time: 01/16/2020/7:53:27 AM    Final        Assessment & Plan:   Problem List Items Addressed This Visit    Venous insufficiency of both lower extremities    Followed by [facility].  Continue compression hose.       Skin lesion    Skin lesion - facial/posterior ear.  Refer to dermatology for evaluation.       Relevant Orders    Ambulatory referral to Dermatology   Pressure injury of right buttock, stage 2 ([place])    Seeing wound clinic.  Using dermacloud.  Lesion improving.  Keep f/u with wound clinic 05/10/20.       Myasthenia gravis without exacerbation ([place])    Followed by neurology.  Has appt 05/11/20 - stable.       Hypertension, benign    Continue lisinopril and lasix.  Follow met b and a1c.  Follow pressures.        Hyperglycemia    Low carb diet and exercise.  Follow met b and a1c.        Relevant Orders   Hemoglobin W1X   Basic metabolic panel   Hypercholesterolemia    On pravastatin.  Low cholesterol diet and exercise.  Follow lipid panel and liver function tests.        Relevant Orders   Hepatic function panel   Lipid panel   History of bladder cancer  Followed by urology.       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  Stable.        Chronic systolic CHF (congestive heart failure), NYHA class 3 (HCC)    EF - 30-35%.  No evidence of volume overload.  Breathing stable.  Continue lisinopril and lasix.        CAD (coronary artery disease)    Has seen cardiology.  Continue risk factor modification.        Aortic aneurysm [facility])    Evaluated 07/2018 - Dr Genevive Bi.  Stable.  Per note, will see back on an as needed basis.            [name], MD

## 2020-05-07 DIAGNOSIS — B028 Zoster with other complications: Secondary | ICD-10-CM | POA: Diagnosis not present

## 2020-05-08 ENCOUNTER — Ambulatory Visit: Payer: Medicare Other | Admitting: Physician Assistant

## 2020-05-10 ENCOUNTER — Other Ambulatory Visit: Payer: Self-pay

## 2020-05-10 ENCOUNTER — Encounter: Payer: Medicare Other | Attending: Physician Assistant | Admitting: Physician Assistant

## 2020-05-10 ENCOUNTER — Ambulatory Visit: Payer: Medicare Other

## 2020-05-10 DIAGNOSIS — M1990 Unspecified osteoarthritis, unspecified site: Secondary | ICD-10-CM | POA: Insufficient documentation

## 2020-05-10 DIAGNOSIS — G7000 Myasthenia gravis without (acute) exacerbation: Secondary | ICD-10-CM | POA: Insufficient documentation

## 2020-05-10 DIAGNOSIS — Z8551 Personal history of malignant neoplasm of bladder: Secondary | ICD-10-CM | POA: Insufficient documentation

## 2020-05-10 DIAGNOSIS — I252 Old myocardial infarction: Secondary | ICD-10-CM | POA: Insufficient documentation

## 2020-05-10 DIAGNOSIS — Z882 Allergy status to sulfonamides status: Secondary | ICD-10-CM | POA: Diagnosis not present

## 2020-05-10 DIAGNOSIS — Z833 Family history of diabetes mellitus: Secondary | ICD-10-CM | POA: Insufficient documentation

## 2020-05-10 DIAGNOSIS — Z8249 Family history of ischemic heart disease and other diseases of the circulatory system: Secondary | ICD-10-CM | POA: Diagnosis not present

## 2020-05-10 DIAGNOSIS — Z87891 Personal history of nicotine dependence: Secondary | ICD-10-CM | POA: Insufficient documentation

## 2020-05-10 DIAGNOSIS — Z8349 Family history of other endocrine, nutritional and metabolic diseases: Secondary | ICD-10-CM | POA: Insufficient documentation

## 2020-05-10 DIAGNOSIS — L89313 Pressure ulcer of right buttock, stage 3: Secondary | ICD-10-CM | POA: Diagnosis not present

## 2020-05-10 DIAGNOSIS — I5042 Chronic combined systolic (congestive) and diastolic (congestive) heart failure: Secondary | ICD-10-CM | POA: Insufficient documentation

## 2020-05-10 DIAGNOSIS — L89312 Pressure ulcer of right buttock, stage 2: Secondary | ICD-10-CM | POA: Diagnosis not present

## 2020-05-10 DIAGNOSIS — Z809 Family history of malignant neoplasm, unspecified: Secondary | ICD-10-CM | POA: Insufficient documentation

## 2020-05-10 DIAGNOSIS — Z9049 Acquired absence of other specified parts of digestive tract: Secondary | ICD-10-CM | POA: Diagnosis not present

## 2020-05-10 DIAGNOSIS — Z820 Family history of epilepsy and other diseases of the nervous system: Secondary | ICD-10-CM | POA: Insufficient documentation

## 2020-05-10 DIAGNOSIS — I110 Hypertensive heart disease with heart failure: Secondary | ICD-10-CM | POA: Diagnosis not present

## 2020-05-10 NOTE — Progress Notes (Addendum)
WISAM, SIEFRING ([id number]) Visit Report for 05/10/2020 Chief Complaint Document Details Patient Name: [name], [name]. Date of Service: 05/10/2020 2:30 PM Medical Record Number: [id number] Patient Account Number: [id number] Date of Birth/Sex: 1926-02-11 (86 y.o. M) Treating RN: Army Melia Primary Care Provider: [name] Other Clinician: Referring Provider: [name] Treating Provider/Extender: Melburn Hake, Cedar Ditullio Weeks in Treatment: 2 Information Obtained from: Patient Chief Complaint Right gluteal ulcer Electronic Signature(s) Signed: 05/10/2020 2:51:20 PM By: Worthy Keeler PA-C Entered By: Worthy Keeler on 05/10/2020 14:51:19 [name] ([id number]) -------------------------------------------------------------------------------- HPI Details Patient Name: [name]. Date of Service: 05/10/2020 2:30 PM Medical Record Number: [id number] Patient Account Number: [id number] Date of Birth/Sex: Dec 14, 1925 (86 y.o. M) Treating RN: Army Melia Primary Care Provider: [name] Other Clinician: Referring Provider: [name] Treating Provider/Extender: Melburn Hake, Carnelius Hammitt Weeks in Treatment: 2 History of Present Illness HPI Description: 04/24/2020 on evaluation today patient presents for initial evaluation here in the clinic concerning an issue that has been having with the right gluteal region. He tells me currently that this has been present for a couple of weeks. He believes it may have been secondary to having been riding on his tractor a lot and sliding to the side especially when he is on heels. Subsequently this may have been a combination of pressure/friction as far as the injury is concerned. Nonetheless he tells me that it has been burning a lot and draining quite a bit as well on his underclothes. The PA he saw that his primary care provider's office yesterday actually suggested that he use Vaseline over it and cover it which actually is not a  terrible idea. Nonetheless there may be some things we can do to help this along a little bit better for him. The patient does have a history of hypertension, congestive heart failure, and myasthenia gravis though he seems to be doing quite well with no exacerbation at this point. 05/10/2020 on evaluation today patient actually appears to be doing very well in regard to his wound in the right gluteal region. Fortunately this appears to likely be healed based on what I am seeing today he has had no drainage from it and overall feel like he has done quite well. Electronic Signature(s) Signed: 05/10/2020 3:26:15 PM By: Worthy Keeler PA-C Entered By: Worthy Keeler on 05/10/2020 15:26:14 [name] ([id number]) -------------------------------------------------------------------------------- Physical Exam Details Patient Name: [name], [name]. Date of Service: 05/10/2020 2:30 PM Medical Record Number: [id number] Patient Account Number: [id number] Date of Birth/Sex: Mar 30, 1926 (86 y.o. M) Treating RN: Army Melia Primary Care Provider: [name] Other Clinician: Referring Provider: [name] Treating Provider/Extender: STONE III, Panayiotis [place] Weeks in Treatment: 2 Constitutional Well-nourished and well-hydrated in no acute distress. Respiratory normal breathing without difficulty. Psychiatric this patient is able to make decisions and demonstrates good insight into disease process. Alert and Oriented x 3. pleasant and cooperative. Notes Patient's wound bed showed signs of good epithelization with no open wound at this time and I really do not think he is going require any bandaging at this point I think this using something such as AandE ointment would be perfect currently. Obviously if he notices any pain, bleeding, or drainage then he should go and put a bandage back on it and contact me and let me know. Electronic Signature(s) Signed: 05/10/2020 3:26:40 PM By: Worthy Keeler  PA-C Entered By: Worthy Keeler on 05/10/2020 15:26:40 [name] ([id number]) -------------------------------------------------------------------------------- Physician Orders Details Patient Name: [name],  Joel D. Date of Service: 05/10/2020 2:30 PM Medical Record Number: [id number] Patient Account Number: [id number] Date of Birth/Sex: 05/31/1926 (86 y.o. M) Treating RN: Army Melia Primary Care Provider: [name] Other Clinician: Referring Provider: [name] Treating Provider/Extender: Melburn Hake, Ashari Llewellyn Weeks in Treatment: 2 Verbal / Phone Orders: No Diagnosis Coding ICD-10 Coding Code Description L89.313 Pressure ulcer of right buttock, stage 3 I10 Essential (primary) hypertension I50.42 Chronic combined systolic (congestive) and diastolic (congestive) heart failure G70.00 Myasthenia gravis without (acute) exacerbation Discharge From [facility] Services o Discharge from [place] complete Electronic Signature(s) Signed: 05/10/2020 3:43:33 PM By: Army Melia Signed: 05/11/2020 5:00:44 PM By: Worthy Keeler PA-C Entered By: Army Melia on 05/10/2020 15:22:51 [name] ([id number]) -------------------------------------------------------------------------------- Problem List Details Patient Name: [name], [name]. Date of Service: 05/10/2020 2:30 PM Medical Record Number: [id number] Patient Account Number: [id number] Date of Birth/Sex: August 27, 1926 (86 y.o. M) Treating RN: Army Melia Primary Care Provider: [name] Other Clinician: Referring Provider: [name] Treating Provider/Extender: Melburn Hake, Nakeita Styles Weeks in Treatment: 2 Active Problems ICD-10 Encounter Code Description Active Date MDM Diagnosis L89.313 Pressure ulcer of right buttock, stage 3 04/24/2020 No Yes I10 Essential (primary) hypertension 04/24/2020 No Yes I50.42 Chronic combined systolic (congestive) and diastolic (congestive) heart 04/24/2020 No  Yes failure G70.00 Myasthenia gravis without (acute) exacerbation 04/24/2020 No Yes Inactive Problems Resolved Problems Electronic Signature(s) Signed: 05/10/2020 2:51:14 PM By: Worthy Keeler PA-C Entered By: Worthy Keeler on 05/10/2020 14:51:14 [name] ([id number]) -------------------------------------------------------------------------------- Progress Note Details Patient Name: [name]. Date of Service: 05/10/2020 2:30 PM Medical Record Number: [id number] Patient Account Number: [id number] Date of Birth/Sex: 1926/07/02 (86 y.o. M) Treating RN: Army Melia Primary Care Provider: [name] Other Clinician: Referring Provider: [name] Treating Provider/Extender: Melburn Hake, Aleza Pew Weeks in Treatment: 2 Subjective Chief Complaint Information obtained from Patient Right gluteal ulcer History of Present Illness (HPI) 04/24/2020 on evaluation today patient presents for initial evaluation here in the clinic concerning an issue that has been having with the right gluteal region. He tells me currently that this has been present for a couple of weeks. He believes it may have been secondary to having been riding on his tractor a lot and sliding to the side especially when he is on heels. Subsequently this may have been a combination of pressure/friction as far as the injury is concerned. Nonetheless he tells me that it has been burning a lot and draining quite a bit as well on his underclothes. The PA he saw that his primary care provider's office yesterday actually suggested that he use Vaseline over it and cover it which actually is not a terrible idea. Nonetheless there may be some things we can do to help this along a little bit better for him. The patient does have a history of hypertension, congestive heart failure, and myasthenia gravis though he seems to be doing quite well with no exacerbation at this point. 05/10/2020 on evaluation today patient actually  appears to be doing very well in regard to his wound in the right gluteal region. Fortunately this appears to likely be healed based on what I am seeing today he has had no drainage from it and overall feel like he has done quite well. Objective Constitutional Well-nourished and well-hydrated in no acute distress. Vitals Time Taken: 2:40 AM, Height: 70 in, Weight: 196 lbs, BMI: 28.1, Temperature: 98.4 F, Pulse: 61 bpm, Respiratory Rate: 16 breaths/min, Blood Pressure: 142/63 mmHg. Respiratory normal breathing  without difficulty. Psychiatric this patient is able to make decisions and demonstrates good insight into disease process. Alert and Oriented x 3. pleasant and cooperative. General Notes: Patient's wound bed showed signs of good epithelization with no open wound at this time and I really do not think he is going require any bandaging at this point I think this using something such as AandE ointment would be perfect currently. Obviously if he notices any pain, bleeding, or drainage then he should go and put a bandage back on it and contact me and let me know. Integumentary (Hair, Skin) Wound #1 status is Healed - Epithelialized. Original cause of wound was Gradually Appeared. The wound is located on the Right,Medial Gluteus. The wound measures 0cm length x 0cm width x 0cm depth; 0cm^2 area and 0cm^3 volume. There is Fat Layer (Subcutaneous Tissue) Exposed exposed. There is a none present amount of drainage noted. The wound margin is distinct with the outline attached to the wound base. There is small (1-33%) red granulation within the wound bed. There is no necrotic tissue within the wound bed. Assessment Active Problems ICD-10 Pressure ulcer of right buttock, stage 3 Essential (primary) hypertension Chronic combined systolic (congestive) and diastolic (congestive) heart failure [name], [name] ([id number]) Myasthenia gravis without (acute) exacerbation Plan Discharge From [facility]  Services: Discharge from [place] complete 1. I would recommend that we discontinue wound care services as the patient appears to be completely healed this is great news. 2. I am also can recommend that we go ahead and have the patient utilize AandE ointment over the area couple times a day to keep this moist and keep it from breaking down. 3. I would also recommend the patient keep a close eye on this and look for any drainage or discharge if he discovers anything he should put a dressing on it and contact the office and let me know here in the clinic so we can get him back in for evaluation. Follow-up as needed Electronic Signature(s) Signed: 05/10/2020 3:27:33 PM By: Worthy Keeler PA-C Entered By: Worthy Keeler on 05/10/2020 15:27:33 [name] ([id number]) -------------------------------------------------------------------------------- SuperBill Details Patient Name: [name]. Date of Service: 05/10/2020 Medical Record Number: [id number] Patient Account Number: [id number] Date of Birth/Sex: 1925/10/14 (86 y.o. M) Treating RN: Army Melia Primary Care Provider: [name] Other Clinician: Referring Provider: [name] Treating Provider/Extender: Melburn Hake, Allsion [place] Weeks in Treatment: 2 Diagnosis Coding ICD-10 Codes Code Description L89.313 Pressure ulcer of right buttock, stage 3 I10 Essential (primary) hypertension I50.42 Chronic combined systolic (congestive) and diastolic (congestive) heart failure G70.00 Myasthenia gravis without (acute) exacerbation Facility Procedures CPT4 Code: [id number] Description: 99213 - WOUND CARE VISIT-LEV 3 EST PT Modifier: Quantity: 1 Physician Procedures CPT4 Code: [id number] Description: 99213 - WC PHYS LEVEL 3 - EST PT Modifier: Quantity: 1 CPT4 Code: Description: ICD-10 Diagnosis Description L89.313 Pressure ulcer of right buttock, stage 3 I50.42 Chronic combined systolic (congestive) and diastolic  (congestive) hear I10 Essential (primary) hypertension G70.00 Myasthenia gravis without (acute)  exacerbation Modifier: t failure Quantity: Electronic Signature(s) Signed: 05/10/2020 3:28:34 PM By: Worthy Keeler PA-C Entered By: Worthy Keeler on 05/10/2020 15:28:34

## 2020-05-11 DIAGNOSIS — G7080 Lambert-Eaton syndrome, unspecified: Secondary | ICD-10-CM | POA: Diagnosis not present

## 2020-05-11 DIAGNOSIS — G7000 Myasthenia gravis without (acute) exacerbation: Secondary | ICD-10-CM | POA: Diagnosis not present

## 2020-05-11 NOTE — Progress Notes (Signed)
[name], [name] ([id number]) Visit Report for 05/10/2020 Arrival Information Details Patient Name: [name], [name]. Date of Service: 05/10/2020 2:30 PM Medical Record Number: [id number] Patient Account Number: [id number] Date of Birth/Sex: September 27, 1926 (86 y.o. M) Treating RN: Army Melia Primary Care Aramis Weil: [name] Other Clinician: Referring Jaxxson Cavanah: [name] Treating Wilbert Schouten/Extender: Melburn Hake, HOYT Weeks in Treatment: 2 Visit Information History Since Last Visit All ordered tests and consults were completed: No Patient Arrived: Ambulatory Added or deleted any medications: No Arrival Time: 14:47 Any new allergies or adverse reactions: No Transfer Assistance: None Had a fall or experienced change in No activities of daily living that may affect risk of falls: Signs or symptoms of abuse/neglect since last visito No Hospitalized since last visit: No Implantable device outside of the clinic excluding No cellular tissue based products placed in the center since last visit: Has Dressing in Place as Prescribed: No Pain Present Now: No Electronic Signature(s) Signed: 05/11/2020 7:59:05 AM By: Darci Needle Entered By: Darci Needle on 05/10/2020 14:48:27 [name] ([id number]) -------------------------------------------------------------------------------- Clinic Level of Care Assessment Details Patient Name: [name]. Date of Service: 05/10/2020 2:30 PM Medical Record Number: [id number] Patient Account Number: [id number] Date of Birth/Sex: 1926/02/01 (86 y.o. M) Treating RN: Army Melia Primary Care Mikya Don: [name] Other Clinician: Referring Jailin Moomaw: [name] Treating Makenzy Krist/Extender: Melburn Hake, HOYT Weeks in Treatment: 2 Clinic Level of Care Assessment Items TOOL 4 Quantity Score []  - Use when only an EandM is performed on FOLLOW-UP visit 0 ASSESSMENTS - Nursing Assessment / Reassessment X - Reassessment of  Co-morbidities (includes updates in patient status) 1 10 X- 1 5 Reassessment of Adherence to Treatment Plan ASSESSMENTS - Wound and Skin Assessment / Reassessment X - Simple Wound Assessment / Reassessment - one wound 1 5 []  - 0 Complex Wound Assessment / Reassessment - multiple wounds []  - 0 Dermatologic / Skin Assessment (not related to wound area) ASSESSMENTS - Focused Assessment []  - Circumferential Edema Measurements - multi extremities 0 []  - 0 Nutritional Assessment / Counseling / Intervention []  - 0 Lower Extremity Assessment (monofilament, tuning fork, pulses) []  - 0 Peripheral Arterial Disease Assessment (using hand held doppler) ASSESSMENTS - Ostomy and/or Continence Assessment and Care []  - Incontinence Assessment and Management 0 []  - 0 Ostomy Care Assessment and Management (repouching, etc.) PROCESS - Coordination of Care X - Simple Patient / Family Education for ongoing care 1 15 []  - 0 Complex (extensive) Patient / Family Education for ongoing care X- 1 10 Staff obtains Programmer, systems, Records, Test Results / Process Orders []  - 0 Staff telephones HHA, Nursing Homes / Clarify orders / etc []  - 0 Routine Transfer to another Facility (non-emergent condition) []  - 0 Routine Hospital Admission (non-emergent condition) []  - 0 New Admissions / Biomedical engineer / Ordering NPWT, Apligraf, etc. []  - 0 Emergency Hospital Admission (emergent condition) X- 1 10 Simple Discharge Coordination []  - 0 Complex (extensive) Discharge Coordination PROCESS - Special Needs []  - Pediatric / Minor Patient Management 0 []  - 0 Isolation Patient Management []  - 0 Hearing / Language / Visual special needs []  - 0 Assessment of Community assistance (transportation, D/C planning, etc.) []  - 0 Additional assistance / Altered mentation X- 1 15 Support Surface(s) Assessment (bed, cushion, seat, etc.) INTERVENTIONS - Wound Cleansing / Measurement [name], [name]. ([id number]) X-  1 5 Simple Wound Cleansing - one wound []  - 0 Complex Wound Cleansing - multiple wounds X- 1 5 Wound Imaging (photographs - any number of  wounds) []  - 0 Wound Tracing (instead of photographs) X- 1 5 Simple Wound Measurement - one wound []  - 0 Complex Wound Measurement - multiple wounds INTERVENTIONS - Wound Dressings []  - Small Wound Dressing one or multiple wounds 0 []  - 0 Medium Wound Dressing one or multiple wounds []  - 0 Large Wound Dressing one or multiple wounds []  - 0 Application of Medications - topical []  - 0 Application of Medications - injection INTERVENTIONS - Miscellaneous []  - External ear exam 0 []  - 0 Specimen Collection (cultures, biopsies, blood, body fluids, etc.) []  - 0 Specimen(s) / Culture(s) sent or taken to Lab for analysis []  - 0 Patient Transfer (multiple staff / Civil Service fast streamer / Similar devices) []  - 0 Simple Staple / Suture removal (25 or less) []  - 0 Complex Staple / Suture removal (26 or more) []  - 0 Hypo / Hyperglycemic Management (close monitor of Blood Glucose) []  - 0 Ankle / Brachial Index (ABI) - do not check if billed separately X- 1 5 Vital Signs Has the patient been seen at the hospital within the last three years: Yes Total Score: 90 Level Of Care: New/Established - Level 3 Electronic Signature(s) Signed: 05/10/2020 3:43:33 PM By: Army Melia Entered By: Army Melia on 05/10/2020 15:23:44 [name] ([id number]) -------------------------------------------------------------------------------- Encounter Discharge Information Details Patient Name: [name], [name]. Date of Service: 05/10/2020 2:30 PM Medical Record Number: [id number] Patient Account Number: [id number] Date of Birth/Sex: [birth year] (86 y.o. M) Treating RN: Army Melia Primary Care Bronislaw Switzer: [name] Other Clinician: Referring Mitchelle Goerner: [name] Treating Izaia Say/Extender: Sharalyn Ink in Treatment: 2 Encounter Discharge Information  Items Discharge Condition: Stable Ambulatory Status: Ambulatory Discharge Destination: Home Transportation: Private Auto Accompanied By: self Schedule Follow-up Appointment: Yes Clinical Summary of Care: Electronic Signature(s) Signed: 05/10/2020 3:43:33 PM By: Army Melia Entered By: Army Melia on 05/10/2020 15:24:15 [name] ([id number]) -------------------------------------------------------------------------------- Lower Extremity Assessment Details Patient Name: [name], [name]. Date of Service: 05/10/2020 2:30 PM Medical Record Number: [id number] Patient Account Number: [id number] Date of Birth/Sex: 1925-12-18 (86 y.o. M) Treating RN: Army Melia Primary Care Liliann File: [name] Other Clinician: Referring Oscar Forman: [name] Treating Kaedyn Belardo/Extender: Melburn Hake, HOYT Weeks in Treatment: 2 Electronic Signature(s) Signed: 05/10/2020 3:43:33 PM By: Army Melia Signed: 05/11/2020 7:59:05 AM By: Darci Needle Entered By: Darci Needle on 05/10/2020 14:50:21 [name] ([id number]) -------------------------------------------------------------------------------- Multi Wound Chart Details Patient Name: [name], [name]. Date of Service: 05/10/2020 2:30 PM Medical Record Number: [id number] Patient Account Number: [id number] Date of Birth/Sex: 12-Jan-1926 (86 y.o. M) Treating RN: Army Melia Primary Care Krithi Bray: [name] Other Clinician: Referring Sidda Humm: [name] Treating Mathius Birkeland/Extender: Melburn Hake, HOYT Weeks in Treatment: 2 Vital Signs Height(in): 70 Pulse(bpm): 61 Weight(lbs): 196 Blood Pressure(mmHg): 142/63 Body Mass Index(BMI): 28 Temperature(F): 98.4 Respiratory Rate(breaths/min): 16 Photos: [N/A:N/A] Wound Location: Right, Medial Gluteus N/A N/A Wounding Event: Gradually Appeared N/A N/A Primary Etiology: Pressure Ulcer N/A N/A Comorbid History: Cataracts, Congestive Heart Failure, N/A N/A Hypertension,  Myocardial Infarction, Peripheral Venous Disease, Osteoarthritis Date Acquired: 04/10/2020 N/A N/A Weeks of Treatment: 2 N/A N/A Wound Status: Healed - Epithelialized N/A N/A Measurements L x W x D (cm) 0x0x0 N/A N/A Area (cm) : 0 N/A N/A Volume (cm) : 0 N/A N/A % Reduction in Area: 100.00% N/A N/A % Reduction in Volume: 100.00% N/A N/A Classification: Category/Stage III N/A N/A Exudate Amount: None Present N/A N/A Wound Margin: Distinct, outline attached N/A N/A Granulation Amount: Small (1-33%) N/A N/A Granulation Quality: Red N/A  N/A Necrotic Amount: None Present (0%) N/A N/A Exposed Structures: Fat Layer (Subcutaneous Tissue) N/A N/A Exposed: Yes Fascia: No Tendon: No Muscle: No Joint: No Bone: No Epithelialization: Small (1-33%) N/A N/A Treatment Notes Electronic Signature(s) Signed: 05/10/2020 3:43:33 PM By: Army Melia Entered By: Army Melia on 05/10/2020 15:22:17 [name] ([id number]) -------------------------------------------------------------------------------- [place] Details Patient Name: [name], [name]. Date of Service: 05/10/2020 2:30 PM Medical Record Number: [id number] Patient Account Number: [id number] Date of Birth/Sex: 12/27/1925 (86 y.o. M) Treating RN: Army Melia Primary Care Sreshta Cressler: [name] Other Clinician: Referring Jahquez Steffler: [name] Treating Jerryl Holzhauer/Extender: Melburn Hake, HOYT Weeks in Treatment: 2 Active Inactive Electronic Signature(s) Signed: 05/10/2020 3:43:33 PM By: Army Melia Entered By: Army Melia on 05/10/2020 15:22:05 [name] ([id number]) -------------------------------------------------------------------------------- Pain Assessment Details Patient Name: [name], [name]. Date of Service: 05/10/2020 2:30 PM Medical Record Number: [id number] Patient Account Number: [id number] Date of Birth/Sex: Jun 02, 1926 (86 y.o. M) Treating RN: Army Melia Primary Care Elaura Calix: [name] Other Clinician: Referring Surya Folden: [name] Treating Spenser Harren/Extender: Melburn Hake, HOYT Weeks in Treatment: 2 Active Problems Location of Pain Severity and Description of Pain Patient Has Paino No Site Locations With Dressing Change: No Pain Management and Medication Current Pain Management: Electronic Signature(s) Signed: 05/10/2020 3:43:33 PM By: Army Melia Signed: 05/11/2020 7:59:05 AM By: Darci Needle Entered By: Darci Needle on 05/10/2020 14:49:09 [name] ([id number]) -------------------------------------------------------------------------------- Patient/Caregiver Education Details Patient Name: [name], [name]. Date of Service: 05/10/2020 2:30 PM Medical Record Number: [id number] Patient Account Number: [id number] Date of Birth/Gender: [birth year] (86 y.o. M) Treating RN: Army Melia Primary Care Physician: [name] Other Clinician: Referring Physician: [name] Treating Physician/Extender: Sharalyn Ink in Treatment: 2 Education Assessment Education Provided To: Patient Education Topics Provided Wound/Skin Impairment: Handouts: Caring for Your Ulcer Methods: Demonstration, Explain/Verbal Responses: State content correctly Electronic Signature(s) Signed: 05/10/2020 3:43:33 PM By: Army Melia Entered By: Army Melia on 05/10/2020 15:23:55 [name] ([id number]) -------------------------------------------------------------------------------- Wound Assessment Details Patient Name: [name], [name]. Date of Service: 05/10/2020 2:30 PM Medical Record Number: [id number] Patient Account Number: [id number] Date of Birth/Sex: May 14, 1926 (86 y.o. M) Treating RN: Army Melia Primary Care Jaycee Pelzer: [name] Other Clinician: Referring Whitnie Deleon: [name] Treating Kruti Horacek/Extender: Melburn Hake, HOYT Weeks in Treatment: 2 Wound Status Wound Number: 1 Primary Pressure Ulcer Etiology: Wound Location:  Right, Medial Gluteus Wound Healed - Epithelialized Wounding Event: Gradually Appeared Status: Date Acquired: 04/10/2020 Comorbid Cataracts, Congestive Heart Failure, Hypertension, Weeks Of Treatment: 2 History: Myocardial Infarction, Peripheral Venous Disease, Clustered Wound: No Osteoarthritis Photos Wound Measurements Length: (cm) 0 Width: (cm) 0 Depth: (cm) 0 Area: (cm) Volume: (cm) % Reduction in Area: 100% % Reduction in Volume: 100% Epithelialization: Small (1-33%) 0 0 Wound Description Classification: Category/Stage III Wound Margin: Distinct, outline attached Exudate Amount: None Present Foul Odor After Cleansing: No Slough/Fibrino Yes Wound Bed Granulation Amount: Small (1-33%) Exposed Structure Granulation Quality: Red Fascia Exposed: No Necrotic Amount: None Present (0%) Fat Layer (Subcutaneous Tissue) Exposed: Yes Tendon Exposed: No Muscle Exposed: No Joint Exposed: No Bone Exposed: No Electronic Signature(s) Signed: 05/10/2020 3:43:33 PM By: Army Melia Entered By: Army Melia on 05/10/2020 15:21:17 [name] ([id number]) -------------------------------------------------------------------------------- Vitals Details Patient Name: [name]. Date of Service: 05/10/2020 2:30 PM Medical Record Number: [id number] Patient Account Number: [id number] Date of Birth/Sex: 16-Jun-1926 (86 y.o. M) Treating RN: Army Melia Primary Care Faigy Stretch: [name] Other Clinician: Referring Nahsir Venezia: [name] Treating Jaionna Weisse/Extender: Melburn Hake, HOYT Weeks in Treatment:  2 Vital Signs Time Taken: 02:40 Temperature (F): 98.4 Height (in): 70 Pulse (bpm): 61 Weight (lbs): 196 Respiratory Rate (breaths/min): 16 Body Mass Index (BMI): 28.1 Blood Pressure (mmHg): 142/63 Reference Range: 80 - 120 mg / dl Electronic Signature(s) Signed: 05/11/2020 7:59:05 AM By: Darci Needle Entered By: Darci Needle on 05/10/2020 14:48:59

## 2020-05-19 ENCOUNTER — Encounter: Payer: Self-pay | Admitting: Internal Medicine

## 2020-05-19 NOTE — Assessment & Plan Note (Signed)
Followed by neurology.  Has appt 05/11/20 - stable.

## 2020-05-19 NOTE — Assessment & Plan Note (Signed)
Continue lisinopril and lasix.  Follow met b and a1c.  Follow pressures.   

## 2020-05-19 NOTE — Assessment & Plan Note (Signed)
Skin lesion - facial/posterior ear.  Refer to dermatology for evaluation.

## 2020-05-19 NOTE — Assessment & Plan Note (Signed)
Evaluated 07/2018 - Dr Oaks.  Stable.  Per note, will see back on an as needed basis.   

## 2020-05-19 NOTE — Assessment & Plan Note (Signed)
Followed by [facility].  Continue compression hose.

## 2020-05-19 NOTE — Assessment & Plan Note (Signed)
On pravastatin.  Low cholesterol diet and exercise.  Follow lipid panel and liver function tests.   

## 2020-05-19 NOTE — Assessment & Plan Note (Signed)
Followed by urology.   

## 2020-05-19 NOTE — Assessment & Plan Note (Signed)
Seeing wound clinic.  Using dermacloud.  Lesion improving.  Keep f/u with wound clinic 05/10/20.

## 2020-05-19 NOTE — Assessment & Plan Note (Signed)
Low carb diet and exercise.  Follow met b and a1c.   

## 2020-05-19 NOTE — Assessment & Plan Note (Signed)
Followed by neurology.  Stable  

## 2020-05-19 NOTE — Assessment & Plan Note (Signed)
Has seen cardiology.  Continue risk factor modification.

## 2020-05-19 NOTE — Assessment & Plan Note (Signed)
EF - 30-35%.  No evidence of volume overload.  Breathing stable.  Continue lisinopril and lasix.

## 2020-05-19 NOTE — Addendum Note (Signed)
Addended by: Alisa Graff on: 05/19/2020 03:34 PM   Modules accepted: Orders

## 2020-06-07 DIAGNOSIS — I2510 Atherosclerotic heart disease of native coronary artery without angina pectoris: Secondary | ICD-10-CM | POA: Diagnosis not present

## 2020-06-07 DIAGNOSIS — I5023 Acute on chronic systolic (congestive) heart failure: Secondary | ICD-10-CM | POA: Diagnosis not present

## 2020-06-07 DIAGNOSIS — I5022 Chronic systolic (congestive) heart failure: Secondary | ICD-10-CM | POA: Diagnosis not present

## 2020-06-07 DIAGNOSIS — Z23 Encounter for immunization: Secondary | ICD-10-CM | POA: Diagnosis not present

## 2020-07-16 ENCOUNTER — Other Ambulatory Visit (INDEPENDENT_AMBULATORY_CARE_PROVIDER_SITE_OTHER): Payer: Medicare Other

## 2020-07-16 ENCOUNTER — Other Ambulatory Visit: Payer: Self-pay

## 2020-07-16 DIAGNOSIS — E7800 Pure hypercholesterolemia, unspecified: Secondary | ICD-10-CM | POA: Diagnosis not present

## 2020-07-16 DIAGNOSIS — Z23 Encounter for immunization: Secondary | ICD-10-CM

## 2020-07-16 DIAGNOSIS — R739 Hyperglycemia, unspecified: Secondary | ICD-10-CM

## 2020-07-16 LAB — HEPATIC FUNCTION PANEL
ALT: 9 U/L (ref 0–53)
AST: 12 U/L (ref 0–37)
Albumin: 4.1 g/dL (ref 3.5–5.2)
Alkaline Phosphatase: 61 U/L (ref 39–117)
Bilirubin, Direct: 0.1 mg/dL (ref 0.0–0.3)
Total Bilirubin: 0.7 mg/dL (ref 0.2–1.2)
Total Protein: 6.2 g/dL (ref 6.0–8.3)

## 2020-07-16 LAB — BASIC METABOLIC PANEL
BUN: 21 mg/dL (ref 6–23)
CO2: 32 mEq/L (ref 19–32)
Calcium: 8.9 mg/dL (ref 8.4–10.5)
Chloride: 103 mEq/L (ref 96–112)
Creatinine, Ser: 1.11 mg/dL (ref 0.40–1.50)
GFR: 57.39 mL/min — ABNORMAL LOW (ref >60.00)
Glucose, Bld: 100 mg/dL — ABNORMAL HIGH (ref 70–99)
Potassium: 4.9 mEq/L (ref 3.5–5.1)
Sodium: 143 mEq/L (ref 135–145)

## 2020-07-16 LAB — HEMOGLOBIN A1C: Hgb A1c MFr Bld: 6.1 % (ref 4.6–6.5)

## 2020-07-16 LAB — LIPID PANEL
Cholesterol: 155 mg/dL (ref 0–200)
HDL: 53.8 mg/dL (ref >39.00)
LDL Cholesterol: 86 mg/dL (ref 0–99)
NonHDL: 101.29
Total CHOL/HDL Ratio: 3
Triglycerides: 77 mg/dL (ref 0.0–149.0)
VLDL: 15.4 mg/dL (ref 0.0–40.0)

## 2020-07-18 ENCOUNTER — Other Ambulatory Visit: Payer: Self-pay

## 2020-07-18 ENCOUNTER — Ambulatory Visit: Payer: Medicare Other | Admitting: Internal Medicine

## 2020-07-18 DIAGNOSIS — E7800 Pure hypercholesterolemia, unspecified: Secondary | ICD-10-CM

## 2020-07-18 DIAGNOSIS — R739 Hyperglycemia, unspecified: Secondary | ICD-10-CM

## 2020-07-18 DIAGNOSIS — I10 Essential (primary) hypertension: Secondary | ICD-10-CM

## 2020-07-18 DIAGNOSIS — G7080 Lambert-Eaton syndrome, unspecified: Secondary | ICD-10-CM

## 2020-07-18 DIAGNOSIS — I712 Thoracic aortic aneurysm, without rupture, unspecified: Secondary | ICD-10-CM

## 2020-07-18 DIAGNOSIS — I5022 Chronic systolic (congestive) heart failure: Secondary | ICD-10-CM

## 2020-07-18 DIAGNOSIS — G7000 Myasthenia gravis without (acute) exacerbation: Secondary | ICD-10-CM

## 2020-07-18 DIAGNOSIS — Z8551 Personal history of malignant neoplasm of bladder: Secondary | ICD-10-CM

## 2020-07-18 DIAGNOSIS — I2510 Atherosclerotic heart disease of native coronary artery without angina pectoris: Secondary | ICD-10-CM

## 2020-07-18 DIAGNOSIS — D649 Anemia, unspecified: Secondary | ICD-10-CM | POA: Diagnosis not present

## 2020-07-18 DIAGNOSIS — R600 Localized edema: Secondary | ICD-10-CM | POA: Diagnosis not present

## 2020-07-18 NOTE — Progress Notes (Signed)
Patient ID: [name], male   DOB: May 31, 1926, 86 y.o.   MRN: [id number]   Subjective:    Patient ID: [name], male    DOB: [birth year], 86 y.o.   MRN: [id number]  HPI This visit occurred during the SARS-CoV-2 public health emergency.  Safety protocols were in place, including screening questions prior to the visit, additional usage of staff PPE, and extensive cleaning of exam room while observing appropriate contact time as indicated for disinfecting solutions.  Patient here for a scheduled follow up.  He reports he is doing well.  Breathing stable.  Sees Dr Nadara Mustard for f/u of Myasthenia and [name].  Has f/u next week.  Stable.  No chest pain or increased sob.  Eating.  No nausea or vomiting.  Bowels moving.  Handling stress.    Past Medical History:  Diagnosis Date  . Abnormal chest CT 03/28/2017  . Allergy   . Anemia   . Angina pectoris ([place]) 04/02/2011  . Aortic aneurysm ([place]) 03/28/2017   Saw Dr Genevive Bi 04/16/17 - recommended f/u CT in 3 months.    . Atherosclerosis of both carotid arteries 07/17/2014  . Benign lipomatous neoplasm of skin and subcutaneous tissue of left arm 10/04/2013  . Bilateral carotid artery stenosis 08/30/2014   Overview:  Less than 50% 2015  . Bladder cancer ([place]) 03/05/2013  . CAD (coronary artery disease) 06/18/2014  . Cancer ([place]) 11-24-12   bladder. BCG treatments by Dr Jacqlyn Larsen.  . Chicken pox   . Chronic prostatitis 09/30/2012  . Colon polyp   . Congestive heart failure ([place]) 04/20/2014   Overview:  Overview:  With anterior mi and moderate lv dyfunction ef 35%  . Dizziness 12/22/2016  . 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 Eaton-Lambert syndrome ([place])   . Elevated prostate specific antigen (PSA) 09/30/2012  . Essential hypertension, benign   . Gross hematuria 09/30/2012  . Herpes zoster 11/28/2011   Overview:  with ocular involvement OD  . Hypercholesterolemia   . Hypertension, benign 04/02/2011  . Hypotension   . Moderate mitral  insufficiency 05/18/2015  . Moderate tricuspid insufficiency 05/18/2015  . Myasthenia gravis ([place])   . Myasthenia gravis without exacerbation ([place]) 03/31/2011  . Shingles 2013   Past Surgical History:  Procedure Laterality Date  . APPENDECTOMY  1958  . CHOLECYSTECTOMY  1979  . HERNIA REPAIR  1970  . TONSILLECTOMY  1958   Family History  Problem Relation Age of Onset  . Lung cancer Sister   . Prostate cancer Brother   . Lung cancer Brother   . Arthritis Other        parent  . Colon cancer Neg Hx    Social History   Socioeconomic History  . Marital status: Married    Spouse name: Not on file  . Number of children: Not on file  . Years of education: Not on file  . Highest education level: Not on file  Occupational History  . Not on file  Tobacco Use  . Smoking status: Former Smoker    Quit date: 09/29/1958    Years since quitting: 61.8  . Smokeless tobacco: Never Used  Vaping Use  . Vaping Use: Never used  Substance and Sexual Activity  . Alcohol use: Yes    Alcohol/week: 0.0 standard drinks    Comment: occasionally  . Drug use: No  . Sexual activity: Never  Other Topics Concern  . Not on file  Social History Narrative   Pt is married  with 2 children - 1 son, 1 daughter. Previously self-employed in [employer]   Social Determinants of Health   Financial Resource Strain:   . Difficulty of Paying Living Expenses: Not on file  Food Insecurity:   . Worried About Charity fundraiser in the Last Year: Not on file  . Ran Out of Food in the Last Year: Not on file  Transportation Needs:   . Lack of Transportation (Medical): Not on file  . Lack of Transportation (Non-Medical): Not on file  Physical Activity:   . Days of Exercise per Week: Not on file  . Minutes of Exercise per Session: Not on file  Stress:   . Feeling of Stress : Not on file  Social Connections:   . Frequency of Communication with Friends and Family: Not on file  . Frequency of Social Gatherings  with Friends and Family: Not on file  . Attends Religious Services: Not on file  . Active Member of Clubs or Organizations: Not on file  . Attends Archivist Meetings: Not on file  . Marital Status: Not on file    Outpatient Encounter Medications as of 07/18/2020  Medication Sig  . aspirin 81 MG tablet Take 81 mg by mouth daily.  . finasteride (PROSCAR) 5 MG tablet Take 5 mg by mouth daily.  . furosemide (LASIX) 40 MG tablet Take 1 tablet (40 mg total) by mouth daily.  . hydroxypropyl methylcellulose / hypromellose (ISOPTO TEARS / GONIOVISC) 2.5 % ophthalmic solution Place 1 drop into both eyes daily.  Marland Kitchen lisinopril (ZESTRIL) 5 MG tablet Hold until outpatient followup since your blood pressure was on the low side in the hospital.  . mycophenolate (CELLCEPT) 250 MG capsule Take 1,000 mg by mouth 2 (two) times daily.   . pravastatin (PRAVACHOL) 20 MG tablet Take 1 tablet (20 mg total) by mouth daily.  . prednisoLONE acetate (PRED FORTE) 1 % ophthalmic suspension Place 1 drop into the right eye daily. Per patient.  . Probiotic Product (ALIGN) 4 MG CAPS One capsule q day  . pyridostigmine (MESTINON) 60 MG tablet Take 30 mg by mouth 6 (six) times daily.   . tamsulosin (FLOMAX) 0.4 MG CAPS Take 0.4 mg by mouth daily.  . [place] (DERMACLOUD) CREA Apply a small amount with a Q tip to the buttock decubitus 2-3 times a day as needed until healed.   No facility-administered encounter medications on file as of 07/18/2020.    Review of Systems  Constitutional: Negative for appetite change and unexpected weight change.  HENT: Negative for congestion and sinus pressure.   Respiratory: Negative for cough and chest tightness.        Breathing stable.   Cardiovascular: Negative for chest pain and palpitations.       Leg swelling improved.   Gastrointestinal: Negative for abdominal pain, diarrhea, nausea and vomiting.  Genitourinary: Negative for difficulty  urinating and dysuria.  Musculoskeletal: Negative for joint swelling and myalgias.  Skin: Negative for color change and rash.  Neurological: Negative for dizziness, light-headedness and headaches.  Psychiatric/Behavioral: Negative for agitation and dysphoric mood.       Objective:    Physical Exam Vitals reviewed.  Constitutional:      General: He is not in acute distress.    Appearance: Normal appearance. He is well-developed.  HENT:     Head: Normocephalic and atraumatic.     Right Ear: External ear normal.     Left Ear: External ear normal.  Eyes:     General: No scleral icterus.       Right eye: No discharge.        Left eye: No discharge.     Conjunctiva/sclera: Conjunctivae normal.  Cardiovascular:     Rate and Rhythm: Normal rate and regular rhythm.  Pulmonary:     Effort: Pulmonary effort is normal. No respiratory distress.     Breath sounds: Normal breath sounds.  Abdominal:     General: Bowel sounds are normal.     Palpations: Abdomen is soft.     Tenderness: There is no abdominal tenderness.  Musculoskeletal:        General: No tenderness.     Cervical back: Neck supple. No tenderness.     Comments: Lower extremity - no increased swelling.    Lymphadenopathy:     Cervical: No cervical adenopathy.  Skin:    Findings: No erythema or rash.  Neurological:     Mental Status: He is alert.  Psychiatric:        Mood and Affect: Mood normal.        Behavior: Behavior normal.     BP 120/70   Pulse 73   Temp 98.1 F (36.7 C) (Oral)   Resp 16   Ht $R'5\' 10"'oG$  (1.778 m)   Wt 201 lb (91.2 kg)   SpO2 98%   BMI 28.84 kg/m  Wt Readings from Last 3 Encounters:  07/18/20 201 lb (91.2 kg)  05/01/20 202 lb (91.6 kg)  04/23/20 198 lb (89.8 kg)     Lab Results  Component Value Date   WBC 5.3 02/13/2020   HGB 13.7 02/13/2020   HCT 42.4 02/13/2020   PLT 161.0 02/13/2020   GLUCOSE 100 (H) 07/16/2020   CHOL 155 07/16/2020   TRIG 77.0 07/16/2020   HDL 53.80  07/16/2020   LDLDIRECT 143.5 08/26/2013   LDLCALC 86 07/16/2020   ALT 9 07/16/2020   AST 12 07/16/2020   NA 143 07/16/2020   K 4.9 07/16/2020   CL 103 07/16/2020   CREATININE 1.11 07/16/2020   BUN 21 07/16/2020   CO2 32 07/16/2020   TSH 2.65 02/13/2020   HGBA1C 6.1 07/16/2020    ECHOCARDIOGRAM COMPLETE  Result Date: 01/16/2020    ECHOCARDIOGRAM REPORT   Patient Name:   [name] Date of Exam: 01/15/2020 Medical Rec #:  [id number]       Height:       70.0 in Accession #:    [id number]      Weight:       201.3 lb Date of Birth:  1926/03/06       BSA:          2.093 m Patient Age:    4 years        BP:           110/66 mmHg Patient Gender: M               HR:           65 bpm. Exam Location:  [facility] Procedure: 2D Echo and Intracardiac Opacification Agent Indications:     Dyspnea  History:         Patient has no prior history of Echocardiogram examinations.                  CHF, Previous Myocardial Infarction and CAD; Risk                  Factors:Hypertension.  Sonographer:     Raliegh Ip Thornton-Maynard Referring Phys:  [id number] [place] Diagnosing Phys: Yolonda Kida MD IMPRESSIONS  1. Ant/Apical/Septal hypo.  2. Left ventricular ejection fraction, by estimation, is 30 to 35%. The left ventricle has moderately decreased function. The left ventricle demonstrates regional wall motion abnormalities (see scoring diagram/findings for description). The left ventricular internal cavity size was mildly dilated. Left ventricular diastolic parameters are consistent with Grade III diastolic dysfunction (restrictive).  3. Right ventricular systolic function is moderately reduced. The right ventricular size is moderately enlarged. Mildly increased right ventricular wall thickness. There is moderately elevated pulmonary artery systolic pressure.  4. Left atrial size was mildly dilated.  5. Right atrial size was mildly dilated.  6. The mitral valve is grossly normal. Mild mitral valve regurgitation.  7. Tricuspid  valve regurgitation is moderate.  8. The aortic valve is grossly normal. Aortic valve regurgitation is not visualized. FINDINGS  Left Ventricle: Left ventricular ejection fraction, by estimation, is 30 to 35%. The left ventricle has moderately decreased function. The left ventricle demonstrates regional wall motion abnormalities. Definity contrast agent was given IV to delineate the left ventricular endocardial borders. The left ventricular internal cavity size was mildly dilated. There is no left ventricular hypertrophy. Left ventricular diastolic parameters are consistent with Grade III diastolic dysfunction (restrictive).  LV Wall Scoring: The entire apex is hypokinetic. Right Ventricle: The right ventricular size is moderately enlarged. Mildly increased right ventricular wall thickness. Right ventricular systolic function is moderately reduced. There is moderately elevated pulmonary artery systolic pressure. The tricuspid regurgitant velocity is 3.65 m/s, and with an assumed right atrial pressure of 10 mmHg, the estimated right ventricular systolic pressure is 65.4 mmHg. Left Atrium: Left atrial size was mildly dilated. Right Atrium: Right atrial size was mildly dilated. Pericardium: There is no evidence of pericardial effusion. Mitral Valve: The mitral valve is grossly normal. Mild mitral valve regurgitation. Tricuspid Valve: The tricuspid valve is grossly normal. Tricuspid valve regurgitation is moderate. Aortic Valve: The aortic valve is grossly normal. Aortic valve regurgitation is not visualized. Aortic valve mean gradient measures 11.0 mmHg. Aortic valve peak gradient measures 21.5 mmHg. Aortic valve area, by VTI measures 1.34 cm. Pulmonic Valve: The pulmonic valve was grossly normal. Pulmonic valve regurgitation is not visualized. Aorta: The aortic root is normal in size and structure. IAS/Shunts: No atrial level shunt detected by color flow Doppler. Additional Comments: Ant/Apical/Septal hypo.  LEFT  VENTRICLE PLAX 2D LVIDd:         5.42 cm      Diastology LVIDs:         4.53 cm      LV e' lateral:   4.12 cm/s LV PW:         1.14 cm      LV E/e' lateral: 16.2 LV IVS:        1.34 cm      LV e' medial:    3.70 cm/s LVOT diam:     2.30 cm      LV E/e' medial:  18.1 LV SV:         67 LV SV Index:   32 LVOT Area:     4.15 cm  LV Volumes (MOD) LV vol d, MOD A2C: 142.0 ml LV vol d, MOD A4C: 183.0 ml LV vol s, MOD A2C: 122.0 ml LV vol s, MOD A4C: 128.0 ml LV SV MOD A2C:     20.0 ml LV SV MOD A4C:     183.0 ml  LV SV MOD BP:      31.2 ml RIGHT VENTRICLE RV S prime:     15.50 cm/s TAPSE (M-mode): 2.7 cm LEFT ATRIUM             Index LA diam:        3.90 cm 1.86 cm/m LA Vol (A2C):   71.0 ml 33.92 ml/m LA Vol (A4C):   80.9 ml 38.65 ml/m LA Biplane Vol: 75.6 ml 36.12 ml/m  AORTIC VALVE AV Area (Vmax):    1.23 cm AV Area (Vmean):   1.31 cm AV Area (VTI):     1.34 cm AV Vmax:           232.00 cm/s AV Vmean:          158.000 cm/s AV VTI:            0.499 m AV Peak Grad:      21.5 mmHg AV Mean Grad:      11.0 mmHg LVOT Vmax:         68.70 cm/s LVOT Vmean:        49.900 cm/s LVOT VTI:          0.161 m LVOT/AV VTI ratio: 0.32  AORTA Ao Root diam: 4.00 cm MITRAL VALVE                TRICUSPID VALVE MV Area (PHT): 3.49 cm     TR Peak grad:   53.3 mmHg MV E velocity: 66.90 cm/s   TR Vmax:        365.00 cm/s MV A velocity: 122.00 cm/s MV E/A ratio:  0.55         SHUNTS                             Systemic VTI:  0.16 m                             Systemic Diam: 2.30 cm Dwayne Prince [place] MD Electronically signed by Yolonda Kida MD Signature Date/Time: 01/16/2020/7:53:27 AM    Final        Assessment & Plan:   Problem List Items Addressed This Visit    Myasthenia gravis without exacerbation ([place])    Followed by neurology.  Has appt next week.       Relevant Orders   CBC with Differential/Platelet   Hypertension, benign    Continue lisinopril and lasix.  Blood pressure doing well.  Follow met b.       Relevant  Orders   Basic metabolic panel   Hyperglycemia    Low carb diet and exercise.  Follow met b and a1c.       Relevant Orders   Hemoglobin A1c   Hypercholesterolemia    On pravastatin.  Low cholesterol diet and exercise. Follow lipid panel and liver function tests.        Relevant Orders   Hepatic function panel   Lipid panel   History of bladder cancer    Followed by urology.       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H[facility])    Has been stable. Has f/u with neurology next week.        Chronic systolic CHF (congestive heart failure), NYHA class 3 (HCC)    EF 30-35%.  On lasix and lisinopril and doing well.  Follow.        CHF (congestive heart failure) (  [place])    On lasix and lisinopril.  No evidence of volume overload.  Follow       CAD (coronary artery disease)    Has been evaluated by cardiology.  Denies any chest pain.  Breathing stable.  Continue risk factor modification.       Bilateral lower extremity edema    Has seen [facility].  Leg swelling better.  Remains on lasix.  Follow.       Benign essential hypertension    Blood pressure doing well on lasix and lisinopril.  Follow met b. Follow pressures.        Aortic aneurysm [facility])    Evaluated 07/2018 - Dr Genevive Bi.  Stable.  Per note, will see back on an as needed basis.        Anemia    Follow cbc.           [name], MD

## 2020-07-25 ENCOUNTER — Ambulatory Visit (INDEPENDENT_AMBULATORY_CARE_PROVIDER_SITE_OTHER): Payer: Medicare Other | Admitting: Nurse Practitioner

## 2020-07-29 ENCOUNTER — Encounter: Payer: Self-pay | Admitting: Internal Medicine

## 2020-07-29 NOTE — Assessment & Plan Note (Signed)
Followed by neurology.  Has appt next week.

## 2020-07-29 NOTE — Assessment & Plan Note (Signed)
Follow cbc.  

## 2020-07-29 NOTE — Assessment & Plan Note (Signed)
On lasix and lisinopril.  No evidence of volume overload.  Follow

## 2020-07-29 NOTE — Assessment & Plan Note (Signed)
EF 30-35%.  On lasix and lisinopril and doing well.  Follow.

## 2020-07-29 NOTE — Assessment & Plan Note (Signed)
Followed by urology.   

## 2020-07-29 NOTE — Assessment & Plan Note (Signed)
On pravastatin.  Low cholesterol diet and exercise.  Follow lipid panel and liver function tests.   

## 2020-07-29 NOTE — Assessment & Plan Note (Signed)
Low carb diet and exercise.  Follow met b and a1c.  

## 2020-07-29 NOTE — Assessment & Plan Note (Addendum)
Has seen [facility].  Leg swelling better.  Remains on lasix.  Follow.

## 2020-07-29 NOTE — Assessment & Plan Note (Signed)
Has been stable. Has f/u with neurology next week.

## 2020-07-29 NOTE — Assessment & Plan Note (Signed)
Blood pressure doing well on lasix and lisinopril.  Follow met b. Follow pressures.

## 2020-07-29 NOTE — Assessment & Plan Note (Signed)
Evaluated 07/2018 - Dr Genevive Bi.  Stable.  Per note, will see back on an as needed basis.

## 2020-07-29 NOTE — Assessment & Plan Note (Signed)
Continue lisinopril and lasix.  Blood pressure doing well.  Follow met b.

## 2020-07-29 NOTE — Assessment & Plan Note (Signed)
Has been evaluated by cardiology.  Denies any chest pain.  Breathing stable.  Continue risk factor modification.

## 2020-08-09 ENCOUNTER — Telehealth: Payer: Self-pay | Admitting: Internal Medicine

## 2020-08-09 NOTE — Telephone Encounter (Signed)
Left message for patient to call back and schedule Medicare Annual Wellness Visit (AWV)   This should be a telephone visit only=30 minutes.  Last AWV 05/10/19; please schedule at anytime with Denisa O'Brien-Blaney at [facility].

## 2020-08-15 ENCOUNTER — Other Ambulatory Visit: Payer: Self-pay

## 2020-08-15 ENCOUNTER — Ambulatory Visit (INDEPENDENT_AMBULATORY_CARE_PROVIDER_SITE_OTHER): Payer: Medicare Other | Admitting: Nurse Practitioner

## 2020-08-15 ENCOUNTER — Encounter (INDEPENDENT_AMBULATORY_CARE_PROVIDER_SITE_OTHER): Payer: Self-pay | Admitting: Nurse Practitioner

## 2020-08-15 VITALS — BP 159/73 | HR 60 | Resp 16 | Wt 205.0 lb

## 2020-08-15 DIAGNOSIS — I10 Essential (primary) hypertension: Secondary | ICD-10-CM

## 2020-08-15 DIAGNOSIS — E7800 Pure hypercholesterolemia, unspecified: Secondary | ICD-10-CM | POA: Diagnosis not present

## 2020-08-15 DIAGNOSIS — I890 Lymphedema, not elsewhere classified: Secondary | ICD-10-CM | POA: Diagnosis not present

## 2020-08-19 NOTE — Progress Notes (Signed)
Subjective:    Patient ID: [name], male    DOB: Apr 03, 1926, 86 y.o.   MRN: [id number] Chief Complaint  Patient presents with  . Follow-up    10month follow up    The patient returns to the office for followup evaluation regarding leg swelling.  The swelling has improved quite a bit and the pain associated with swelling has decreased substantially. There have not been any interval development of a ulcerations or wounds.  Since the previous visit the patient has been wearing graduated compression stockings and has noted  improvement in the lymphedema. The patient has been using compression routinely morning until night.  The patient also states elevation during the day and exercise is being done too.      Review of Systems  Cardiovascular: Positive for leg swelling.  All other systems reviewed and are negative.      Objective:   Physical Exam Vitals reviewed.  HENT:     Head: Normocephalic.  Cardiovascular:     Rate and Rhythm: Normal rate.     Pulses: Normal pulses.  Pulmonary:     Effort: Pulmonary effort is normal.  Musculoskeletal:     Right lower leg: 1+ Edema present.     Left lower leg: 1+ Edema present.  Skin:    General: Skin is warm and dry.  Neurological:     Mental Status: He is alert and oriented to person, place, and time.  Psychiatric:        Mood and Affect: Mood normal.        Behavior: Behavior normal.        Thought Content: Thought content normal.        Judgment: Judgment normal.     BP (!) 159/73 (BP Location: Right Arm)   Pulse 60   Resp 16   Wt 205 lb (93 kg)   BMI 29.41 kg/m   Past Medical History:  Diagnosis Date  . Abnormal chest CT 03/28/2017  . Allergy   . Anemia   . Angina pectoris ([place]) 04/02/2011  . Aortic aneurysm ([place]) 03/28/2017   Saw Dr Genevive Bi 04/16/17 - recommended f/u CT in 3 months.    . Atherosclerosis of both carotid arteries 07/17/2014  . Benign lipomatous neoplasm of skin and subcutaneous tissue of left arm  10/04/2013  . Bilateral carotid artery stenosis 08/30/2014   Overview:  Less than 50% 2015  . Bladder cancer ([place]) 03/05/2013  . CAD (coronary artery disease) 06/18/2014  . Cancer ([place]) 11-24-12   bladder. BCG treatments by Dr Jacqlyn Larsen.  . Chicken pox   . Chronic prostatitis 09/30/2012  . Colon polyp   . Congestive heart failure ([place]) 04/20/2014   Overview:  Overview:  With anterior mi and moderate lv dyfunction ef 35%  . Dizziness 12/22/2016  . 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 Elevated prostate specific antigen (PSA) 09/30/2012  . Essential hypertension, benign   . Gross hematuria 09/30/2012  . Herpes zoster 11/28/2011   Overview:  with ocular involvement OD  . Hypercholesterolemia   . Hypertension, benign 04/02/2011  . Hypotension   . Moderate mitral insufficiency 05/18/2015  . Moderate tricuspid insufficiency 05/18/2015  . Myasthenia gravis ([place])   . Myasthenia gravis without exacerbation ([place]) 03/31/2011  . Shingles 2013    Social History   Socioeconomic History  . Marital status: Married    Spouse name: Not on file  . Number of children: Not on file  .  Years of education: Not on file  . Highest education level: Not on file  Occupational History  . Not on file  Tobacco Use  . Smoking status: Former Smoker    Quit date: 09/29/1958    Years since quitting: 61.9  . Smokeless tobacco: Never Used  Vaping Use  . Vaping Use: Never used  Substance and Sexual Activity  . Alcohol use: Yes    Alcohol/week: 0.0 standard drinks    Comment: occasionally  . Drug use: No  . Sexual activity: Never  Other Topics Concern  . Not on file  Social History Narrative   Pt is married with 2 children - 1 son, 1 daughter. Previously self-employed in [employer]   Social Determinants of Health   Financial Resource Strain:   . Difficulty of Paying Living Expenses: Not on file  Food Insecurity:   . Worried About Charity fundraiser in the Last Year:  Not on file  . Ran Out of Food in the Last Year: Not on file  Transportation Needs:   . Lack of Transportation (Medical): Not on file  . Lack of Transportation (Non-Medical): Not on file  Physical Activity:   . Days of Exercise per Week: Not on file  . Minutes of Exercise per Session: Not on file  Stress:   . Feeling of Stress : Not on file  Social Connections:   . Frequency of Communication with Friends and Family: Not on file  . Frequency of Social Gatherings with Friends and Family: Not on file  . Attends Religious Services: Not on file  . Active Member of Clubs or Organizations: Not on file  . Attends Archivist Meetings: Not on file  . Marital Status: Not on file  Intimate Partner Violence:   . Fear of Current or Ex-Partner: Not on file  . Emotionally Abused: Not on file  . Physically Abused: Not on file  . Sexually Abused: Not on file    Past Surgical History:  Procedure Laterality Date  . APPENDECTOMY  1958  . CHOLECYSTECTOMY  1979  . HERNIA REPAIR  1970  . TONSILLECTOMY  1958    Family History  Problem Relation Age of Onset  . Lung cancer Sister   . Prostate cancer Brother   . Lung cancer Brother   . Arthritis Other        parent  . Colon cancer Neg Hx     Allergies  Allergen Reactions  . Beta Adrenergic Blockers Other (See Comments)    Beta Blocker will precipitate acute weakness in Lambert-Eaton Syndrome or Myasthenia Gravis  . Calcium Channel Blockers Other (See Comments)    Calcium Channel Blocker- will precipitate acute weakness in Lambert-Eaton Syndrome.  . Ciprofloxacin Other (See Comments)    Last resort due to Myasthia Gravis  . Sulfa Antibiotics Rash    CBC Latest Ref Rng & Units 02/13/2020 01/15/2020 01/14/2020  WBC 4.0 - 10.5 K/uL 5.3 4.9 5.9  Hemoglobin 13.0 - 17.0 g/dL 13.7 11.7(L) 12.0(L)  Hematocrit 39 - 52 % 42.4 37.4(L) 40.2  Platelets 150 - 400 K/uL 161.0 175 209      CMP     Component Value Date/Time   NA 143  07/16/2020 0833   K 4.9 07/16/2020 0833   CL 103 07/16/2020 0833   CO2 32 07/16/2020 0833   GLUCOSE 100 (H) 07/16/2020 0833   BUN 21 07/16/2020 0833   CREATININE 1.11 07/16/2020 0833   CALCIUM 8.9 07/16/2020 7510  PROT 6.2 07/16/2020 0833   ALBUMIN 4.1 07/16/2020 0833   AST 12 07/16/2020 0833   ALT 9 07/16/2020 0833   ALKPHOS 61 07/16/2020 0833   BILITOT 0.7 07/16/2020 0833   GFRNONAA 58 (L) 01/15/2020 0508   GFRAA >60 01/15/2020 0508     No results found.     Assessment & Plan:   1. Lymphedema  No surgery or intervention at this point in time.    I have reviewed my discussion with the patient regarding lymphedema and why it  causes symptoms.  Patient will continue wearing graduated compression stockings class 1 (20-30 mmHg) on a daily basis a prescription was given. The patient is reminded to put the stockings on first thing in the morning and removing them in the evening. The patient is instructed specifically not to sleep in the stockings.   In addition, behavioral modification throughout the day will be continued.  This will include frequent elevation (such as in a recliner), use of over the counter pain medications as needed and exercise such as walking.  I have reviewed systemic causes for chronic edema such as liver, kidney and cardiac etiologies and there does not appear to be any significant changes in these organ systems over the past year.  The patient is under the impression that these organ systems are all stable and unchanged.    The patient will follow-up with me on an annual basis.    2. Benign essential hypertension Continue antihypertensive medications as already ordered, these medications have been reviewed and there are no changes at this time.   3. Hypercholesterolemia Continue statin as ordered and reviewed, no changes at this time    Current Outpatient Medications on File Prior to Visit  Medication Sig Dispense Refill  . aspirin 81 MG tablet Take  81 mg by mouth daily.    . finasteride (PROSCAR) 5 MG tablet Take 5 mg by mouth daily.    . hydroxypropyl methylcellulose / hypromellose (ISOPTO TEARS / GONIOVISC) 2.5 % ophthalmic solution Place 1 drop into both eyes daily.    Marland Kitchen lisinopril (ZESTRIL) 5 MG tablet Hold until outpatient followup since your blood pressure was on the low side in the hospital.    . mycophenolate (CELLCEPT) 250 MG capsule Take 1,000 mg by mouth 2 (two) times daily.     . pravastatin (PRAVACHOL) 20 MG tablet Take 1 tablet (20 mg total) by mouth daily. 90 tablet 1  . prednisoLONE acetate (PRED FORTE) 1 % ophthalmic suspension Place 1 drop into the right eye daily. Per patient. 5 mL 0  . Probiotic Product (ALIGN) 4 MG CAPS One capsule q day 30 capsule 0  . pyridostigmine (MESTINON) 60 MG tablet Take 30 mg by mouth 6 (six) times daily.     . tamsulosin (FLOMAX) 0.4 MG CAPS Take 0.4 mg by mouth daily.    . furosemide (LASIX) 40 MG tablet Take 1 tablet (40 mg total) by mouth daily. 90 tablet 3   No current facility-administered medications on file prior to visit.    There are no Patient Instructions on file for this visit. No follow-ups on file.   Kris Hartmann, NP

## 2020-09-17 ENCOUNTER — Ambulatory Visit: Payer: Medicare Other | Admitting: Dermatology

## 2020-09-17 ENCOUNTER — Encounter: Payer: Self-pay | Admitting: Dermatology

## 2020-09-17 ENCOUNTER — Other Ambulatory Visit: Payer: Self-pay

## 2020-09-17 DIAGNOSIS — L814 Other melanin hyperpigmentation: Secondary | ICD-10-CM

## 2020-09-17 DIAGNOSIS — L98491 Non-pressure chronic ulcer of skin of other sites limited to breakdown of skin: Secondary | ICD-10-CM

## 2020-09-17 DIAGNOSIS — L97911 Non-pressure chronic ulcer of unspecified part of right lower leg limited to breakdown of skin: Secondary | ICD-10-CM

## 2020-09-17 DIAGNOSIS — Z1283 Encounter for screening for malignant neoplasm of skin: Secondary | ICD-10-CM | POA: Diagnosis not present

## 2020-09-17 DIAGNOSIS — L97921 Non-pressure chronic ulcer of unspecified part of left lower leg limited to breakdown of skin: Secondary | ICD-10-CM | POA: Diagnosis not present

## 2020-09-17 DIAGNOSIS — I872 Venous insufficiency (chronic) (peripheral): Secondary | ICD-10-CM

## 2020-09-17 DIAGNOSIS — L820 Inflamed seborrheic keratosis: Secondary | ICD-10-CM

## 2020-09-17 DIAGNOSIS — L570 Actinic keratosis: Secondary | ICD-10-CM

## 2020-09-17 DIAGNOSIS — Z85828 Personal history of other malignant neoplasm of skin: Secondary | ICD-10-CM

## 2020-09-17 DIAGNOSIS — D1800 Hemangioma unspecified site: Secondary | ICD-10-CM

## 2020-09-17 DIAGNOSIS — D229 Melanocytic nevi, unspecified: Secondary | ICD-10-CM

## 2020-09-17 DIAGNOSIS — L821 Other seborrheic keratosis: Secondary | ICD-10-CM

## 2020-09-17 DIAGNOSIS — L578 Other skin changes due to chronic exposure to nonionizing radiation: Secondary | ICD-10-CM

## 2020-09-17 NOTE — Progress Notes (Signed)
Follow-Up Visit   Subjective  [name] is a 86 y.o. male who presents for the following: Annual Exam (Hx SCC ). Patient c/o ulceration on the left lower leg and would like to have it checked.  The patient presents for Total-Body Skin Exam (TBSE) for skin cancer screening and mole check.  The following portions of the chart were reviewed this encounter and updated as appropriate:   Tobacco  Allergies  Meds  Problems  Med Hx  Surg Hx  Fam Hx     Review of Systems:  No other skin or systemic complaints except as noted in HPI or Assessment and Plan.  Objective  Well appearing patient in no apparent distress; mood and affect are within normal limits.  A full examination was performed including scalp, head, eyes, ears, nose, lips, neck, chest, axillae, abdomen, back, buttocks, bilateral upper extremities, bilateral lower extremities, hands, feet, fingers, toes, fingernails, and toenails. All findings within normal limits unless otherwise noted below.  Objective  Scalp and face x 6 (6): Erythematous thin papules/macules with gritty scale.   Objective  L neck x 3 (3): Erythematous keratotic or waxy stuck-on papule or plaque.   Objective  B/L legs: Edema.  Objective  L pretibial: 1.0 cm ulceration  Assessment & Plan  AK (actinic keratosis) (6) Scalp and face x 6  Destruction of lesion - Scalp and face x 6 Complexity: simple   Destruction method: cryotherapy   Informed consent: discussed and consent obtained   Timeout:  patient name, date of birth, surgical site, and procedure verified Lesion destroyed using liquid nitrogen: Yes   Region frozen until ice ball extended beyond lesion: Yes   Outcome: patient tolerated procedure well with no complications   Post-procedure details: wound care instructions given    Inflamed seborrheic keratosis (3) L neck x 3  Destruction of lesion - L neck x 3 Complexity: simple   Destruction method: cryotherapy   Informed  consent: discussed and consent obtained   Timeout:  patient name, date of birth, surgical site, and procedure verified Lesion destroyed using liquid nitrogen: Yes   Region frozen until ice ball extended beyond lesion: Yes   Outcome: patient tolerated procedure well with no complications   Post-procedure details: wound care instructions given    Stasis dermatitis of both legs with Stasis Ulcer - chronic persistent condition that has flared. B/L legs Severe -  Patient is being followed by vascular physicians, but not scheduled to see them for several months.  He asks if we can help his legs today especially with Holidays approaching and he knows he will not be able to get in soon with Vascular. Non-pressure chronic ulcer of skin of other sites limited to breakdown of skin (HCC) L pretibial Mechanical debridement performed today with Puracyn and Hibiclens, Mupirocin 2% ointment applied to aa then covered with Duoderm Extra thin. Unnaboot applied today followed by Coban wrap over top. Patient instructed to remove Unna boot in one week. Patient verbalized understanding on when and how to remove Unna boot.    Lentigines - Scattered tan macules - Discussed due to sun exposure - Benign, observe - Call for any changes  Seborrheic Keratoses - Stuck-on, waxy, tan-brown papules and plaques  - Discussed benign etiology and prognosis. - Observe - Call for any changes  Melanocytic Nevi - Tan-brown and/or pink-flesh-colored symmetric macules and papules - Benign appearing on exam today - Observation - Call clinic for new or changing moles - Recommend daily use of  broad spectrum spf 30+ sunscreen to sun-exposed areas.   Hemangiomas - Red papules - Discussed benign nature - Observe - Call for any changes  Actinic Damage - Chronic, secondary to cumulative UV/sun exposure - diffuse scaly erythematous macules with underlying dyspigmentation - Recommend daily broad spectrum sunscreen SPF 30+ to  sun-exposed areas, reapply every 2 hours as needed.  - Call for new or changing lesions.  History of Skin Cancer   Clear. Observe for recurrence.  Call clinic for new or changing lesions.   Recommend regular skin exams, daily broad-spectrum spf 30+ sunscreen use, and photoprotection.    Skin cancer screening performed today.  Return in about 1 month (around 10/18/2020) for ulceration recheck S/P unnaboot .  Luther Redo, CMA, am acting as scribe for [name], MD .  Documentation: I have reviewed the above documentation for accuracy and completeness, and I agree with the above.  [name], MD

## 2020-09-24 ENCOUNTER — Encounter: Payer: Self-pay | Admitting: Dermatology

## 2020-10-04 ENCOUNTER — Ambulatory Visit: Payer: Medicare Other | Admitting: Family

## 2020-10-19 ENCOUNTER — Ambulatory Visit: Payer: Medicare Other | Admitting: Family

## 2020-10-24 ENCOUNTER — Ambulatory Visit: Payer: Medicare Other | Admitting: Dermatology

## 2020-10-24 ENCOUNTER — Other Ambulatory Visit: Payer: Self-pay

## 2020-10-24 DIAGNOSIS — I872 Venous insufficiency (chronic) (peripheral): Secondary | ICD-10-CM

## 2020-10-24 DIAGNOSIS — L578 Other skin changes due to chronic exposure to nonionizing radiation: Secondary | ICD-10-CM

## 2020-10-24 DIAGNOSIS — L820 Inflamed seborrheic keratosis: Secondary | ICD-10-CM

## 2020-10-24 DIAGNOSIS — I890 Lymphedema, not elsewhere classified: Secondary | ICD-10-CM

## 2020-10-24 DIAGNOSIS — L821 Other seborrheic keratosis: Secondary | ICD-10-CM

## 2020-10-24 NOTE — Progress Notes (Unsigned)
   Follow-Up Visit   Subjective  [name] is a 87 y.o. male who presents for the following: Follow-up (1 month f/u on ulcers and swelling at the lower legs, pt went to to vein and vascular clinic and was diagnosed with Lymphedema 08/15/2020). Pt report he can not get compression stocking on his legs because all the swelling.   The following portions of the chart were reviewed this encounter and updated as appropriate:   Tobacco  Allergies  Meds  Problems  Med Hx  Surg Hx  Fam Hx     Review of Systems:  No other skin or systemic complaints except as noted in HPI or Assessment and Plan.  Objective  Well appearing patient in no apparent distress; mood and affect are within normal limits.  A focused examination was performed including face,arms,hands,legs . Relevant physical exam findings are noted in the Assessment and Plan.  Objective  Left Lower Leg - Anterior: Erythematous, scaly patches involving the ankle and distal lower leg with associated lower leg edema.   Images      Objective  bilateral hands, legs x 16 (16): Erythematous keratotic or waxy stuck-on papule or plaque.    Assessment & Plan  Stasis dermatitis of both legs with lymphedema worse on the left leg.  Patient finds it difficult to wear compression stockings.  Recommend he be evaluated by the lymphedema clinic.  Will refer to lymphedema clinic. Other Related Procedures AMB referral to rehabilitation  Venous stasis dermatitis of left lower extremity Left Lower Leg - Anterior Chronic and persistent  Recommend wearing compression stockings daily.  He states he finds it difficult to wear graduated compression stockings.  Gave him formation about a zipper knee-high compression stocking he can purchase to see if this works better. Referral to Lymphedema clinic. Patient needs evaluation for stasis dermatitis and lymphedema of the lower legs. Consider compression pumps.  Inflamed seborrheic keratosis  (16) bilateral hands, legs x 16  Destruction of lesion - bilateral hands, legs x 16 Complexity: simple   Destruction method: cryotherapy   Informed consent: discussed and consent obtained   Timeout:  patient name, date of birth, surgical site, and procedure verified Lesion destroyed using liquid nitrogen: Yes   Region frozen until ice ball extended beyond lesion: Yes   Outcome: patient tolerated procedure well with no complications   Post-procedure details: wound care instructions given    Seborrheic Keratoses - Stuck-on, waxy, tan-brown papules and plaques  - Discussed benign etiology and prognosis. - Observe - Call for any changes  Actinic Damage - chronic, secondary to cumulative UV radiation exposure/sun exposure over time - diffuse scaly erythematous macules with underlying dyspigmentation - Recommend daily broad spectrum sunscreen SPF 30+ to sun-exposed areas, reapply every 2 hours as needed.  - Call for new or changing lesions.  Return in about 3 months (around 01/22/2021) for stasis dermatitis .  IMarye Round, CMA, am acting as scribe for [name], MD .  Documentation: I have reviewed the above documentation for accuracy and completeness, and I agree with the above.  [name], MD

## 2020-10-26 ENCOUNTER — Encounter: Payer: Self-pay | Admitting: Dermatology

## 2020-10-30 NOTE — Progress Notes (Signed)
Patient ID: [name], male    DOB: [birth year], 87 y.o.   MRN: [id number]  HPI  [name] is a 87 y/o male with a history of HTN, hyperlipidemia, CAD, carotid disease, bladder cancer, anemia, Lambert-Eaton syndrome, myasthenia gravis, previous tobacco use and chronic heart failure.   Echo report from 01/15/20 reviewed and showed an EF of 30-35% along with moderately elevated PA pressure, mild Joel, mild AS and moderate TR.  Catheterization done 04/06/14.  Has not been admitted or been in the ED in the last 6 months.   He presents today for a follow-up visit with a chief complaint of minimal shortness of breath upon moderate exertion. He describes this as chronic in nature having been present for several years. He has associated fatigue, gradual weight gain and continued edema in lower legs. He denies any difficulty sleeping, dizziness, abdominal distention, palpitations, chest pain or cough.   Admits that he's been eating too many sweets since the end of last year. 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   Tried wearing the compression socks but they hurt his legs. He is waiting on an order of zip up compression socks and is going to try those.   Past Medical History:  Diagnosis Date  . Abnormal chest CT 03/28/2017  . Allergy   . Anemia   . Angina pectoris ([name]) 04/02/2011  . Aortic aneurysm ([place]) 03/28/2017   Saw Dr [name] 04/16/17 - recommended f/u CT in 3 months.    . Atherosclerosis of both carotid arteries 07/17/2014  . Benign lipomatous neoplasm of skin and subcutaneous tissue of left arm 10/04/2013  . Bilateral carotid artery stenosis 08/30/2014   Overview:  Less than 50% 2015  . Bladder cancer ([place]) 03/05/2013  . CAD (coronary artery disease) 06/18/2014  . Cancer ([place]) 11-24-12   bladder. BCG treatments by Dr Jacqlyn Larsen.  . Chicken pox   . Chronic prostatitis 09/30/2012  . Colon polyp   . Congestive heart failure ([place]) 04/20/2014   Overview:  Overview:  With anterior mi and moderate  lv dyfunction ef 35%  . Dizziness 12/22/2016  . 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 Eaton-Lambert syndrome ([place])   . Elevated prostate specific antigen (PSA) 09/30/2012  . Essential hypertension, benign   . Gross hematuria 09/30/2012  . Herpes zoster 11/28/2011   Overview:  with ocular involvement OD  . Hypercholesterolemia   . Hypertension, benign 04/02/2011  . Hypotension   . Moderate mitral insufficiency 05/18/2015  . Moderate tricuspid insufficiency 05/18/2015  . Myasthenia gravis ([place])   . Myasthenia gravis without exacerbation ([place]) 03/31/2011  . Shingles 2013  . Squamous cell carcinoma of skin 11/22/2015   Left cheek. WD SCC. Re-shaved 01/14/2016. Excised 02/05/2016, margins free.  Marland Kitchen Squamous cell carcinoma of skin 11/03/2018   Right cheek ant. to sideburn. Poorly differentiated.  . Squamous cell carcinoma of skin 02/02/2019   Right mid dorsum forearm. WD SCC. EDC.   Past Surgical History:  Procedure Laterality Date  . APPENDECTOMY  1958  . CHOLECYSTECTOMY  1979  . HERNIA REPAIR  1970  . TONSILLECTOMY  1958   Family History  Problem Relation Age of Onset  . Lung cancer Sister   . Prostate cancer Brother   . Lung cancer Brother   . Arthritis Other        parent  . Colon cancer Neg Hx    Social History   Tobacco Use  . Smoking status: Former Smoker  Quit date: 09/29/1958    Years since quitting: 62.1  . Smokeless tobacco: Never Used  Substance Use Topics  . Alcohol use: Yes    Alcohol/week: 0.0 standard drinks    Comment: occasionally   Allergies  Allergen Reactions  . Beta Adrenergic Blockers Other (See Comments)    Beta Blocker will precipitate acute weakness in Lambert-Eaton Syndrome or Myasthenia Gravis  . Calcium Channel Blockers Other (See Comments)    Calcium Channel Blocker- will precipitate acute weakness in Lambert-Eaton Syndrome.  . Ciprofloxacin Other (See Comments)    Last resort due to Myasthia Gravis  . Sulfa Antibiotics Rash   Prior  to Admission medications   Medication Sig Start Date End Date Taking? Authorizing Provider  aspirin 81 MG tablet Take 81 mg by mouth daily.   Yes [provider]  finasteride (PROSCAR) 5 MG tablet Take 5 mg by mouth daily.   Yes [provider]  furosemide (LASIX) 40 MG tablet Take 1 tablet (40 mg total) by mouth daily. 04/03/20 07/02/20 Yes Shoshana Johal, Otila Kluver A, FNP  hydroxypropyl methylcellulose / hypromellose (ISOPTO TEARS / GONIOVISC) 2.5 % ophthalmic solution Place 1 drop into both eyes daily.   Yes [provider]  lisinopril (ZESTRIL) 5 MG tablet Hold until outpatient followup since your blood pressure was on the low side in the hospital. 01/15/20  Yes Enzo Bi, MD  mycophenolate (CELLCEPT) 250 MG capsule Take 1,000 mg by mouth 2 (two) times daily.    Yes [provider]  pravastatin (PRAVACHOL) 20 MG tablet Take 1 tablet (20 mg total) by mouth daily. 07/27/14  Yes [name], MD  prednisoLONE acetate (PRED FORTE) 1 % ophthalmic suspension Place 1 drop into the right eye daily. Per patient. 01/15/20  Yes Enzo Bi, MD  Probiotic Product (ALIGN) 4 MG CAPS One capsule q day 06/30/19  Yes [name], MD  pyridostigmine (MESTINON) 60 MG tablet Take 30 mg by mouth 6 (six) times daily.    Yes [provider]  tamsulosin (FLOMAX) 0.4 MG CAPS Take 0.4 mg by mouth daily.   Yes [provider]    Review of Systems  Constitutional: Positive for fatigue. Negative for appetite change.  HENT: Negative for congestion, rhinorrhea and sore throat.   Eyes: Negative.   Respiratory: Positive for shortness of breath. Negative for cough and wheezing.   Cardiovascular: Positive for leg swelling. Negative for chest pain and palpitations.  Gastrointestinal: Negative for abdominal distention and abdominal pain.  Endocrine: Negative.   Genitourinary: Negative.   Musculoskeletal: Negative for arthralgias, back pain and neck pain.  Skin: Positive for color  change (chronic redness lower legs).  Allergic/Immunologic: Negative.   Neurological: Negative for dizziness and light-headedness.  Hematological: Negative for adenopathy. Does not bruise/bleed easily.  Psychiatric/Behavioral: Negative for dysphoric mood and sleep disturbance (sleeping on 1-2 pillows). The patient is not nervous/anxious.    Vitals:   10/31/20 0958  BP: 127/67  Pulse: 69  Resp: 18  SpO2: 95%  Weight: 211 lb 2 oz (95.8 kg)  Height: 5\' 10"  (1.778 m)   Wt Readings from Last 3 Encounters:  10/31/20 211 lb 2 oz (95.8 kg)  08/15/20 205 lb (93 kg)  07/18/20 201 lb (91.2 kg)   Lab Results  Component Value Date   CREATININE 1.11 07/16/2020   CREATININE 1.07 02/13/2020   CREATININE 1.10 01/15/2020    Physical Exam Vitals and nursing note reviewed.  Constitutional:      Appearance: Normal appearance.  HENT:  Head: Normocephalic and atraumatic.  Cardiovascular:     Rate and Rhythm: Normal rate and regular rhythm.  Pulmonary:     Effort: Pulmonary effort is normal. No respiratory distress.     Breath sounds: No wheezing or rales.  Abdominal:     General: There is no distension.     Palpations: Abdomen is soft.  Musculoskeletal:        General: No tenderness.     Cervical back: Normal range of motion and neck supple.     Right lower leg: Edema (1+ pitting) present.     Left lower leg: Edema (2+ pitting) present.  Skin:    General: Skin is warm and dry.  Neurological:     General: No focal deficit present.     Mental Status: He is alert and oriented to person, place, and time.  Psychiatric:        Mood and Affect: Mood normal.        Behavior: Behavior normal.        Thought Content: Thought content normal.     Assessment & Plan:  1: Chronic heart failure with reduced ejection fraction- - NYHA class II - euvolemic today - weighing daily;; reminded to call for an overnight weight gain of >2 pounds or a weekly weight gain of >5 pounds - advised him to  mark on his paper when he takes any additional diuretic; has done that twice in ~ the last month - weight up 13 pounds from last visit here 7 months ago; admits to eating too many sweets - not adding salt and has been looking at food labels; reminded to closely follow a low sodium diet - unsure if BP could tolerate entresto as it's been low in the past - saw cardiology Nehemiah Massed) 06/07/20 - beta-blockers and calcium channel blockers cause weakness due to Lambert-Eaton syndrome - BNP 01/13/20 was 764.0 - has received both COVID vaccines  2: HTN- - BP looks good today - saw PCP Nicki Reaper) 07/18/20 - BMP 07/16/20 reviewed and showed sodium 143, potassium 4.9, creatinine 1.11 and GFR 57.39  3: Lymphedema- - stage 2 - elevating his legs when sitting for long periods of time  - does have compression socks but says that he has great difficulty in getting them on; has ordered zip up compression socks so he's waiting on those to arrive - dermatologist had placed referral to lymphedema clinic; phone number to the clinic provided and if he doesn't hear anything by next week, he can call them to f/u - will see if zip up compression socks will work for him before making referral for compression boots - saw vascular Owens Shark) 08/15/20   Patient did not bring his medications nor a list. Each medication was verbally reviewed with the patient and he was encouraged to bring the bottles to every visit to confirm accuracy of list.  Return in 5 weeks or sooner for any questions/problems before then.

## 2020-10-31 ENCOUNTER — Ambulatory Visit: Payer: Medicare Other | Attending: Family | Admitting: Family

## 2020-10-31 ENCOUNTER — Encounter: Payer: Self-pay | Admitting: Family

## 2020-10-31 ENCOUNTER — Other Ambulatory Visit: Payer: Self-pay

## 2020-10-31 VITALS — BP 127/67 | HR 69 | Resp 18 | Ht 70.0 in | Wt 211.1 lb

## 2020-10-31 DIAGNOSIS — Z7982 Long term (current) use of aspirin: Secondary | ICD-10-CM | POA: Insufficient documentation

## 2020-10-31 DIAGNOSIS — D649 Anemia, unspecified: Secondary | ICD-10-CM | POA: Insufficient documentation

## 2020-10-31 DIAGNOSIS — I2510 Atherosclerotic heart disease of native coronary artery without angina pectoris: Secondary | ICD-10-CM | POA: Insufficient documentation

## 2020-10-31 DIAGNOSIS — Z79899 Other long term (current) drug therapy: Secondary | ICD-10-CM | POA: Diagnosis not present

## 2020-10-31 DIAGNOSIS — E785 Hyperlipidemia, unspecified: Secondary | ICD-10-CM | POA: Insufficient documentation

## 2020-10-31 DIAGNOSIS — G7080 Lambert-Eaton syndrome, unspecified: Secondary | ICD-10-CM | POA: Diagnosis not present

## 2020-10-31 DIAGNOSIS — I890 Lymphedema, not elsewhere classified: Secondary | ICD-10-CM

## 2020-10-31 DIAGNOSIS — Z882 Allergy status to sulfonamides status: Secondary | ICD-10-CM | POA: Insufficient documentation

## 2020-10-31 DIAGNOSIS — I110 Hypertensive heart disease with heart failure: Secondary | ICD-10-CM | POA: Diagnosis not present

## 2020-10-31 DIAGNOSIS — I5022 Chronic systolic (congestive) heart failure: Secondary | ICD-10-CM | POA: Diagnosis not present

## 2020-10-31 DIAGNOSIS — Z87891 Personal history of nicotine dependence: Secondary | ICD-10-CM | POA: Insufficient documentation

## 2020-10-31 DIAGNOSIS — Z881 Allergy status to other antibiotic agents status: Secondary | ICD-10-CM | POA: Diagnosis not present

## 2020-10-31 DIAGNOSIS — G7000 Myasthenia gravis without (acute) exacerbation: Secondary | ICD-10-CM | POA: Insufficient documentation

## 2020-10-31 DIAGNOSIS — I779 Disorder of arteries and arterioles, unspecified: Secondary | ICD-10-CM | POA: Insufficient documentation

## 2020-10-31 DIAGNOSIS — I10 Essential (primary) hypertension: Secondary | ICD-10-CM

## 2020-10-31 NOTE — Patient Instructions (Addendum)
Continue weighing daily and call for an overnight weight gain of > 2 pounds or a weekly weight gain of >5 pounds.   Call the lymphedema clinic at [phone] if you haven't heard from them by next week.

## 2020-11-05 DIAGNOSIS — B028 Zoster with other complications: Secondary | ICD-10-CM | POA: Diagnosis not present

## 2020-11-15 ENCOUNTER — Other Ambulatory Visit: Payer: Self-pay

## 2020-11-15 ENCOUNTER — Encounter: Payer: Self-pay | Admitting: Internal Medicine

## 2020-11-15 ENCOUNTER — Other Ambulatory Visit (INDEPENDENT_AMBULATORY_CARE_PROVIDER_SITE_OTHER): Payer: Medicare Other

## 2020-11-15 DIAGNOSIS — E7800 Pure hypercholesterolemia, unspecified: Secondary | ICD-10-CM | POA: Diagnosis not present

## 2020-11-15 DIAGNOSIS — I10 Essential (primary) hypertension: Secondary | ICD-10-CM | POA: Diagnosis not present

## 2020-11-15 DIAGNOSIS — G7000 Myasthenia gravis without (acute) exacerbation: Secondary | ICD-10-CM

## 2020-11-15 DIAGNOSIS — R739 Hyperglycemia, unspecified: Secondary | ICD-10-CM | POA: Diagnosis not present

## 2020-11-15 LAB — BASIC METABOLIC PANEL
BUN: 22 mg/dL (ref 6–23)
CO2: 38 mEq/L — ABNORMAL HIGH (ref 19–32)
Calcium: 9.4 mg/dL (ref 8.4–10.5)
Chloride: 101 mEq/L (ref 96–112)
Creatinine, Ser: 1.13 mg/dL (ref 0.40–1.50)
GFR: 56.55 mL/min — ABNORMAL LOW (ref >60.00)
Glucose, Bld: 108 mg/dL — ABNORMAL HIGH (ref 70–99)
Potassium: 4.4 mEq/L (ref 3.5–5.1)
Sodium: 142 mEq/L (ref 135–145)

## 2020-11-15 LAB — CBC WITH DIFFERENTIAL/PLATELET
Basophils Absolute: 0 10*3/uL (ref 0.0–0.1)
Basophils Relative: 0.8 % (ref 0.0–3.0)
Eosinophils Absolute: 0.1 10*3/uL (ref 0.0–0.7)
Eosinophils Relative: 1.6 % (ref 0.0–5.0)
HCT: 42.4 % (ref 39.0–52.0)
Hemoglobin: 13.6 g/dL (ref 13.0–17.0)
Lymphocytes Relative: 25.1 % (ref 12.0–46.0)
Lymphs Abs: 1.2 10*3/uL (ref 0.7–4.0)
MCHC: 32 g/dL (ref 30.0–36.0)
MCV: 92.1 fl (ref 78.0–100.0)
Monocytes Absolute: 0.5 10*3/uL (ref 0.1–1.0)
Monocytes Relative: 10.5 % (ref 3.0–12.0)
Neutro Abs: 3 10*3/uL (ref 1.4–7.7)
Neutrophils Relative %: 62 % (ref 43.0–77.0)
Platelets: 158 10*3/uL (ref 150.0–400.0)
RBC: 4.6 Mil/uL (ref 4.22–5.81)
RDW: 14.5 % (ref 11.5–15.5)
WBC: 4.8 10*3/uL (ref 4.0–10.5)

## 2020-11-15 LAB — HEPATIC FUNCTION PANEL
ALT: 9 U/L (ref 0–53)
AST: 12 U/L (ref 0–37)
Albumin: 4.2 g/dL (ref 3.5–5.2)
Alkaline Phosphatase: 59 U/L (ref 39–117)
Bilirubin, Direct: 0.2 mg/dL (ref 0.0–0.3)
Total Bilirubin: 0.8 mg/dL (ref 0.2–1.2)
Total Protein: 6.4 g/dL (ref 6.0–8.3)

## 2020-11-15 LAB — LIPID PANEL
Cholesterol: 159 mg/dL (ref 0–200)
HDL: 57.2 mg/dL (ref >39.00)
LDL Cholesterol: 87 mg/dL (ref 0–99)
NonHDL: 102.27
Total CHOL/HDL Ratio: 3
Triglycerides: 74 mg/dL (ref 0.0–149.0)
VLDL: 14.8 mg/dL (ref 0.0–40.0)

## 2020-11-15 LAB — HEMOGLOBIN A1C: Hgb A1c MFr Bld: 5.9 % (ref 4.6–6.5)

## 2020-11-15 NOTE — Telephone Encounter (Signed)
Opened in error

## 2020-11-16 ENCOUNTER — Ambulatory Visit (INDEPENDENT_AMBULATORY_CARE_PROVIDER_SITE_OTHER): Payer: Medicare Other

## 2020-11-16 VITALS — HR 66 | Ht 70.0 in | Wt 208.0 lb

## 2020-11-16 DIAGNOSIS — Z0000 Encounter for general adult medical examination without abnormal findings: Secondary | ICD-10-CM | POA: Diagnosis not present

## 2020-11-16 NOTE — Patient Instructions (Addendum)
Mr. Mcfarland , Thank you for taking time to come for your Medicare Wellness Visit. I appreciate your ongoing commitment to your health goals. Please review the following plan we discussed and let me know if I can assist you in the future.   These are the goals we discussed: Goals    . Follow up with Primary Care Provider     As needed       This is a list of the screening recommended for you and due dates:  Health Maintenance  Topic Date Due  . COVID-19 Vaccine (4 - Booster for Pfizer series) 02/10/2021  . Tetanus Vaccine  10/28/2028  . Flu Shot  Completed  . Pneumonia vaccines  Completed    Immunizations Immunization History  Administered Date(s) Administered  . Fluad Quad(high Dose 65+) 06/30/2019, 07/16/2020  . Influenza Split 06/29/2013  . Influenza, High Dose Seasonal PF 06/24/2016, 06/29/2017, 07/07/2018  . Influenza,inj,Quad PF,6+ Mos 07/26/2014  . Influenza-Unspecified 06/29/2009, 06/29/2010, 07/09/2012, 07/06/2015, 06/29/2016, 06/22/2017, 08/11/2018, 06/30/2019  . PFIZER(Purple Top)SARS-COV-2 Vaccination 10/22/2019, 11/12/2019, 08/13/2020  . Pneumococcal Conjugate-13 10/02/2015  . Pneumococcal Polysaccharide-23 07/08/2017  . Tdap 10/28/2018   Keep all routine maintenance appointments.   Follow up 11/20/19 @ 11:30.   Advanced directives: not yet completed.   Conditions/risks identified: none new.   Follow up in one year for your annual wellness visit.   Preventive Care 23 Years and Older, Male Preventive care refers to lifestyle choices and visits with your health care provider that can promote health and wellness. What does preventive care include?  A yearly physical exam. This is also called an annual well check.  Dental exams once or twice a year.  Routine eye exams. Ask your health care provider how often you should have your eyes checked.  Personal lifestyle choices, including:  Daily care of your teeth and gums.  Regular physical  activity.  Eating a healthy diet.  Avoiding tobacco and drug use.  Limiting alcohol use.  Practicing safe sex.  Taking low doses of aspirin every day.  Taking vitamin and mineral supplements as recommended by your health care provider. What happens during an annual well check? The services and screenings done by your health care provider during your annual well check will depend on your age, overall health, lifestyle risk factors, and family history of disease. Counseling  Your health care provider may ask you questions about your:  Alcohol use.  Tobacco use.  Drug use.  Emotional well-being.  Home and relationship well-being.  Sexual activity.  Eating habits.  History of falls.  Memory and ability to understand (cognition).  Work and work Statistician. Screening  You may have the following tests or measurements:  Height, weight, and BMI.  Blood pressure.  Lipid and cholesterol levels. These may be checked every 5 years, or more frequently if you are over 27 years old.  Skin check.  Lung cancer screening. You may have this screening every year starting at age 11 if you have a 30-pack-year history of smoking and currently smoke or have quit within the past 15 years.  Fecal occult blood test (FOBT) of the stool. You may have this test every year starting at age 34.  Flexible sigmoidoscopy or colonoscopy. You may have a sigmoidoscopy every 5 years or a colonoscopy every 10 years starting at age 46.  Prostate cancer screening. Recommendations will vary depending on your family history and other risks.  Hepatitis C blood test.  Hepatitis B blood test.  Sexually transmitted disease (  STD) testing.  Diabetes screening. This is done by checking your blood sugar (glucose) after you have not eaten for a while (fasting). You may have this done every 1-3 years.  Abdominal aortic aneurysm (AAA) screening. You may need this if you are a current or former  smoker.  Osteoporosis. You may be screened starting at age 33 if you are at high risk. Talk with your health care provider about your test results, treatment options, and if necessary, the need for more tests. Vaccines  Your health care provider may recommend certain vaccines, such as:  Influenza vaccine. This is recommended every year.  Tetanus, diphtheria, and acellular pertussis (Tdap, Td) vaccine. You may need a Td booster every 10 years.  Zoster vaccine. You may need this after age 74.  Pneumococcal 13-valent conjugate (PCV13) vaccine. One dose is recommended after age 60.  Pneumococcal polysaccharide (PPSV23) vaccine. One dose is recommended after age 62. Talk to your health care provider about which screenings and vaccines you need and how often you need them. This information is not intended to replace advice given to you by your health care provider. Make sure you discuss any questions you have with your health care provider. Document Released: 10/12/2015 Document Revised: 06/04/2016 Document Reviewed: 07/17/2015 Elsevier Interactive Patient Education  2017 [place] Prevention in the Home Falls can cause injuries. They can happen to people of all ages. There are many things you can do to make your home safe and to help prevent falls. What can I do on the outside of my home?  Regularly fix the edges of walkways and driveways and fix any cracks.  Remove anything that might make you trip as you walk through a door, such as a raised step or threshold.  Trim any bushes or trees on the path to your home.  Use bright outdoor lighting.  Clear any walking paths of anything that might make someone trip, such as rocks or tools.  Regularly check to see if handrails are loose or broken. Make sure that both sides of any steps have handrails.  Any raised decks and porches should have guardrails on the edges.  Have any leaves, snow, or ice cleared regularly.  Use sand or  salt on walking paths during winter.  Clean up any spills in your garage right away. This includes oil or grease spills. What can I do in the bathroom?  Use night lights.  Install grab bars by the toilet and in the tub and shower. Do not use towel bars as grab bars.  Use non-skid mats or decals in the tub or shower.  If you need to sit down in the shower, use a plastic, non-slip stool.  Keep the floor dry. Clean up any water that spills on the floor as soon as it happens.  Remove soap buildup in the tub or shower regularly.  Attach bath mats securely with double-sided non-slip rug tape.  Do not have throw rugs and other things on the floor that can make you trip. What can I do in the bedroom?  Use night lights.  Make sure that you have a light by your bed that is easy to reach.  Do not use any sheets or blankets that are too big for your bed. They should not hang down onto the floor.  Have a firm chair that has side arms. You can use this for support while you get dressed.  Do not have throw rugs and other things on the  floor that can make you trip. What can I do in the kitchen?  Clean up any spills right away.  Avoid walking on wet floors.  Keep items that you use a lot in easy-to-reach places.  If you need to reach something above you, use a strong step stool that has a grab bar.  Keep electrical cords out of the way.  Do not use floor polish or wax that makes floors slippery. If you must use wax, use non-skid floor wax.  Do not have throw rugs and other things on the floor that can make you trip. What can I do with my stairs?  Do not leave any items on the stairs.  Make sure that there are handrails on both sides of the stairs and use them. Fix handrails that are broken or loose. Make sure that handrails are as long as the stairways.  Check any carpeting to make sure that it is firmly attached to the stairs. Fix any carpet that is loose or worn.  Avoid having  throw rugs at the top or bottom of the stairs. If you do have throw rugs, attach them to the floor with carpet tape.  Make sure that you have a light switch at the top of the stairs and the bottom of the stairs. If you do not have them, ask someone to add them for you. What else can I do to help prevent falls?  Wear shoes that:  Do not have high heels.  Have rubber bottoms.  Are comfortable and fit you well.  Are closed at the toe. Do not wear sandals.  If you use a stepladder:  Make sure that it is fully opened. Do not climb a closed stepladder.  Make sure that both sides of the stepladder are locked into place.  Ask someone to hold it for you, if possible.  Clearly mark and make sure that you can see:  Any grab bars or handrails.  First and last steps.  Where the edge of each step is.  Use tools that help you move around (mobility aids) if they are needed. These include:  Canes.  Walkers.  Scooters.  Crutches.  Turn on the lights when you go into a dark area. Replace any light bulbs as soon as they burn out.  Set up your furniture so you have a clear path. Avoid moving your furniture around.  If any of your floors are uneven, fix them.  If there are any pets around you, be aware of where they are.  Review your medicines with your doctor. Some medicines can make you feel dizzy. This can increase your chance of falling. Ask your doctor what other things that you can do to help prevent falls. This information is not intended to replace advice given to you by your health care provider. Make sure you discuss any questions you have with your health care provider. Document Released: 07/12/2009 Document Revised: 02/21/2016 Document Reviewed: 10/20/2014 Elsevier Interactive Patient Education  2017 [employer].

## 2020-11-16 NOTE — Progress Notes (Addendum)
Subjective:   [name] is a 87 y.o. male who presents for Medicare Annual/Subsequent preventive examination.  Review of Systems    No ROS.  Medicare Wellness Virtual Visit.    Cardiac Risk Factors include: advanced age (>59men, >49 women);male gender;hypertension     Objective:    Today's Vitals   11/16/20 1337  Pulse: 66  Weight: 208 lb (94.3 kg)  Height: 5\' 10"  (1.778 m)   Body mass index is 29.84 kg/m.  Advanced Directives 11/16/2020 01/13/2020 01/13/2020 05/10/2019 08/10/2017 12/27/2016 12/27/2016  Does Patient Have a Medical Advance Directive? No No Yes Yes No No No  Type of Advance Directive - - Public librarian;Living will - - -  Does patient want to make changes to medical advance directive? - No - Patient declined - No - Patient declined - - -  Copy of [place] in Chart? - - - No - copy requested - - -  Would patient like information on creating a medical advance directive? No - Patient declined No - Patient declined - - - Yes (ED - Information included in AVS) Yes (ED - Information included in AVS)    Current Medications (verified) Outpatient Encounter Medications as of 11/16/2020  Medication Sig  . aspirin 81 MG tablet Take 81 mg by mouth daily.  . finasteride (PROSCAR) 5 MG tablet Take 5 mg by mouth daily.  . furosemide (LASIX) 40 MG tablet Take 1 tablet (40 mg total) by mouth daily.  . hydroxypropyl methylcellulose / hypromellose (ISOPTO TEARS / GONIOVISC) 2.5 % ophthalmic solution Place 1 drop into both eyes daily.  Marland Kitchen lisinopril (ZESTRIL) 5 MG tablet Hold until outpatient followup since your blood pressure was on the low side in the hospital.  . mycophenolate (CELLCEPT) 250 MG capsule Take 1,000 mg by mouth 2 (two) times daily.   . pravastatin (PRAVACHOL) 20 MG tablet Take 1 tablet (20 mg total) by mouth daily.  . prednisoLONE acetate (PRED FORTE) 1 % ophthalmic suspension Place 1 drop into the right eye daily. Per patient.   . Probiotic Product (ALIGN) 4 MG CAPS One capsule q day  . pyridostigmine (MESTINON) 60 MG tablet Take 30 mg by mouth 6 (six) times daily.   . tamsulosin (FLOMAX) 0.4 MG CAPS Take 0.4 mg by mouth daily.   No facility-administered encounter medications on file as of 11/16/2020.    Allergies (verified) Beta adrenergic blockers, Calcium channel blockers, Ciprofloxacin, and Sulfa antibiotics   History: Past Medical History:  Diagnosis Date  . Abnormal chest CT 03/28/2017  . Allergy   . Anemia   . Angina pectoris ([place]) 04/02/2011  . Aortic aneurysm ([place]) 03/28/2017   Saw Dr Genevive Bi 04/16/17 - recommended f/u CT in 3 months.    . Atherosclerosis of both carotid arteries 07/17/2014  . Benign lipomatous neoplasm of skin and subcutaneous tissue of left arm 10/04/2013  . Bilateral carotid artery stenosis 08/30/2014   Overview:  Less than 50% 2015  . Bladder cancer ([place]) 03/05/2013  . CAD (coronary artery disease) 06/18/2014  . Cancer ([place]) 11-24-12   bladder. BCG treatments by Dr Jacqlyn Larsen.  . Chicken pox   . Chronic prostatitis 09/30/2012  . Colon polyp   . Congestive heart failure ([place]) 04/20/2014   Overview:  Overview:  With anterior mi and moderate lv dyfunction ef 35%  . Dizziness 12/22/2016  . 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 Elevated prostate specific antigen (PSA) 09/30/2012  .  Essential hypertension, benign   . Gross hematuria 09/30/2012  . Herpes zoster 11/28/2011   Overview:  with ocular involvement OD  . Hypercholesterolemia   . Hypertension, benign 04/02/2011  . Hypotension   . Moderate mitral insufficiency 05/18/2015  . Moderate tricuspid insufficiency 05/18/2015  . Myasthenia gravis ([place])   . Myasthenia gravis without exacerbation ([place]) 03/31/2011  . Shingles 2013  . Squamous cell carcinoma of skin 11/22/2015   Left cheek. WD SCC. Re-shaved 01/14/2016. Excised 02/05/2016, margins free.  Marland Kitchen Squamous cell carcinoma of skin 11/03/2018   Right cheek  ant. to sideburn. Poorly differentiated.  . Squamous cell carcinoma of skin 02/02/2019   Right mid dorsum forearm. WD SCC. EDC.   Past Surgical History:  Procedure Laterality Date  . APPENDECTOMY  1958  . CHOLECYSTECTOMY  1979  . HERNIA REPAIR  1970  . TONSILLECTOMY  1958   Family History  Problem Relation Age of Onset  . Lung cancer Sister   . Prostate cancer Brother   . Lung cancer Brother   . Arthritis Other        parent  . Colon cancer Neg Hx    Social History   Socioeconomic History  . Marital status: Married    Spouse name: Not on file  . Number of children: Not on file  . Years of education: Not on file  . Highest education level: Not on file  Occupational History  . Not on file  Tobacco Use  . Smoking status: Former Smoker    Quit date: 09/29/1958    Years since quitting: 62.1  . Smokeless tobacco: Never Used  Vaping Use  . Vaping Use: Never used  Substance and Sexual Activity  . Alcohol use: Yes    Alcohol/week: 0.0 standard drinks    Comment: occasionally  . Drug use: No  . Sexual activity: Never  Other Topics Concern  . Not on file  Social History Narrative   Pt is married with 2 children - 1 son, 1 daughter. Previously self-employed in [employer]   Social Determinants of Health   Financial Resource Strain: [place]   . Difficulty of Paying Living Expenses: Not hard at all  Food Insecurity: No Food Insecurity  . Worried About Charity fundraiser in the Last Year: Never true  . Ran Out of Food in the Last Year: Never true  Transportation Needs: No Transportation Needs  . Lack of Transportation (Medical): No  . Lack of Transportation (Non-Medical): No  Physical Activity: Not on file  Stress: No Stress Concern Present  . Feeling of Stress : Not at all  Social Connections: Unknown  . Frequency of Communication with Friends and Family: More than three times a week  . Frequency of Social Gatherings with Friends and Family: More than three times  a week  . Attends Religious Services: Not on file  . Active Member of Clubs or Organizations: Not on file  . Attends Archivist Meetings: Not on file  . Marital Status: Widowed    Tobacco Counseling Counseling given: Not Answered   Clinical Intake:  Pre-visit preparation completed: Yes        Diabetes: No  How often do you need to have someone help you when you read instructions, pamphlets, or other written materials from your doctor or pharmacy?: 1 - Never    Interpreter Needed?: No      Activities of Daily Living In your present state of health, do you have any difficulty  performing the following activities: 11/16/2020 10/31/2020  Hearing? Y Y  Comment Followed by [facility]. Does not wear hearing aids. -  Vision? N Y  Difficulty concentrating or making decisions? N N  Walking or climbing stairs? N N  Dressing or bathing? N N  Doing errands, shopping? N N  Preparing Food and eating ? N -  Using the Toilet? N -  In the past six months, have you accidently leaked urine? Y -  Comment Managed by daily pad and Urology. -  Do you have problems with loss of bowel control? N -  Managing your Medications? N -  Managing your Finances? N -  Housekeeping or managing your Housekeeping? N -  Some recent data might be hidden    Patient Care Team: [name], MD as PCP - General (Internal Medicine) [name], MD (Internal Medicine) Bary Castilla Forest Gleason, MD (General Surgery)  Indicate any recent Medical Services you may have received from other than [facility] providers in the past year (date may be approximate).     Assessment:   This is a routine wellness examination for [place].  I connected with Kishon today by telephone and verified that I am speaking with the correct person using two identifiers. Location patient: home Location provider: work Persons participating in the virtual visit: patient, Marine scientist.    I discussed the limitations, risks,  security and privacy concerns of performing an evaluation and management service by telephone and the availability of in person appointments. The patient expressed understanding and verbally consented to this telephonic visit.    Interactive audio and video telecommunications were attempted between this provider and patient, however failed, due to patient having technical difficulties OR patient did not have access to video capability.  We continued and completed visit with audio only.  Some vital signs may be absent or patient reported.   Hearing/Vision screen  Hearing Screening   125Hz  250Hz  500Hz  1000Hz  2000Hz  3000Hz  4000Hz  6000Hz  8000Hz   Right ear:           Left ear:           Comments: Patient is able to hear conversational tones without difficulty.  No issues reported.  Vision Screening Comments: Wears corrective lenses  Visual acuity not assessed, virtual visit. They have seen their ophthalmologist in the last 6 months.   Dietary issues and exercise activities discussed: Current Exercise Habits: The patient does not participate in regular exercise at present  No salt diet Good water intake  Goals    . Follow up with Primary Care Provider     As needed      Depression Screen PHQ 2/9 Scores 11/16/2020 07/18/2020 05/10/2019 09/09/2016 09/10/2015 09/10/2015 03/28/2014  PHQ - 2 Score 0 0 0 0 0 0 0    Fall Risk Fall Risk  11/16/2020 10/31/2020 04/03/2020 01/25/2020 10/20/2019  Falls in the past year? 0 0 0 0 0  Number falls in past yr: 0 0 0 0 -  Injury with Fall? 0 0 0 0 -  Risk for fall due to : - Impaired balance/gait - - -  Follow up Falls evaluation completed Falls evaluation completed Falls evaluation completed Falls evaluation completed Falls evaluation completed    FALL RISK PREVENTION PERTAINING TO THE HOME: Handrails in use when climbing stairs? Yes Home free of loose throw rugs in walkways, pet beds, electrical cords, etc? Yes  Adequate lighting in your home to reduce  risk of falls? Yes   ASSISTIVE DEVICES UTILIZED TO  PREVENT FALLS: Life alert? No  Use of a cane, walker or w/c? No   TIMED UP AND GO: Was the test performed? No .  Virtual visit.   Cognitive Function: MMSE - Mini Mental State Exam 09/09/2016 09/10/2015  Orientation to time 5 5  Orientation to Place 5 5  Registration 3 3  Attention/ Calculation 5 5  Recall 3 3  Language- name 2 objects 2 2  Language- repeat 1 1  Language- follow 3 step command 3 3  Language- read & follow direction 1 1  Write a sentence 1 1  Copy design 1 1  Total score 30 30     6CIT Screen 11/16/2020 05/10/2019  What Year? 0 points 0 points  What month? 0 points 0 points  What time? 0 points 0 points  Count back from 20 0 points 0 points  Months in reverse - 0 points    Immunizations Immunization History  Administered Date(s) Administered  . Fluad Quad(high Dose 65+) 06/30/2019, 07/16/2020  . Influenza Split 06/29/2013  . Influenza, High Dose Seasonal PF 06/24/2016, 06/29/2017, 07/07/2018  . Influenza,inj,Quad PF,6+ Mos 07/26/2014  . Influenza-Unspecified 06/29/2009, 06/29/2010, 07/09/2012, 07/06/2015, 06/29/2016, 06/22/2017, 08/11/2018, 06/30/2019  . PFIZER(Purple Top)SARS-COV-2 Vaccination 10/22/2019, 11/12/2019, 08/13/2020  . Pneumococcal Conjugate-13 10/02/2015  . Pneumococcal Polysaccharide-23 07/08/2017  . Tdap 10/28/2018   Health Maintenance Health Maintenance  Topic Date Due  . COVID-19 Vaccine (4 - Booster for Pfizer series) 02/10/2021  . TETANUS/TDAP  10/28/2028  . INFLUENZA VACCINE  Completed  . PNA vac Low Risk Adult  Completed   Colorectal cancer screening: No longer required.   Lung Cancer Screening: (Low Dose CT Chest recommended if Age 72-80 years, 30 pack-year currently smoking OR have quit w/in 15years.) does not qualify.    Hepatitis C Screening: does not qualify.  Vision Screening: Recommended annual ophthalmology exams for early detection of glaucoma and other disorders  of the eye. Is the patient up to date with their annual eye exam?  Yes   Dental Screening: Recommended annual dental exams for proper oral hygiene.  Community Resource Referral / Chronic Care Management: CRR required this visit?  No   CCM required this visit?  No      Plan:   Keep all routine maintenance appointments.   Follow up 11/20/19 @ 11:30.   I have personally reviewed and noted the following in the patient's chart:   . Medical and social history . Use of alcohol, tobacco or illicit drugs  . Current medications and supplements . Functional ability and status . Nutritional status . Physical activity . Advanced directives . List of other physicians . Hospitalizations, surgeries, and ER visits in previous 12 months . Vitals . Screenings to include cognitive, depression, and falls . Referrals and appointments  In addition, I have reviewed and discussed with patient certain preventive protocols, quality metrics, and best practice recommendations. A written personalized care plan for preventive services as well as general preventive health recommendations were provided to patient via mail.     Varney Biles, LPN   [id number]/54/[id number]

## 2020-11-19 ENCOUNTER — Ambulatory Visit (INDEPENDENT_AMBULATORY_CARE_PROVIDER_SITE_OTHER): Payer: Medicare Other | Admitting: Internal Medicine

## 2020-11-19 ENCOUNTER — Other Ambulatory Visit: Payer: Self-pay

## 2020-11-19 DIAGNOSIS — I712 Thoracic aortic aneurysm, without rupture, unspecified: Secondary | ICD-10-CM

## 2020-11-19 DIAGNOSIS — R739 Hyperglycemia, unspecified: Secondary | ICD-10-CM

## 2020-11-19 DIAGNOSIS — I890 Lymphedema, not elsewhere classified: Secondary | ICD-10-CM

## 2020-11-19 DIAGNOSIS — I5022 Chronic systolic (congestive) heart failure: Secondary | ICD-10-CM

## 2020-11-19 DIAGNOSIS — I10 Essential (primary) hypertension: Secondary | ICD-10-CM | POA: Diagnosis not present

## 2020-11-19 DIAGNOSIS — D649 Anemia, unspecified: Secondary | ICD-10-CM

## 2020-11-19 DIAGNOSIS — G7080 Lambert-Eaton syndrome, unspecified: Secondary | ICD-10-CM

## 2020-11-19 DIAGNOSIS — G7000 Myasthenia gravis without (acute) exacerbation: Secondary | ICD-10-CM | POA: Diagnosis not present

## 2020-11-19 DIAGNOSIS — I872 Venous insufficiency (chronic) (peripheral): Secondary | ICD-10-CM

## 2020-11-19 DIAGNOSIS — E7800 Pure hypercholesterolemia, unspecified: Secondary | ICD-10-CM

## 2020-11-19 DIAGNOSIS — Z8551 Personal history of malignant neoplasm of bladder: Secondary | ICD-10-CM

## 2020-11-19 DIAGNOSIS — K6289 Other specified diseases of anus and rectum: Secondary | ICD-10-CM

## 2020-11-19 DIAGNOSIS — I2510 Atherosclerotic heart disease of native coronary artery without angina pectoris: Secondary | ICD-10-CM

## 2020-11-19 LAB — BASIC METABOLIC PANEL
BUN: 20 mg/dL (ref 6–23)
CO2: 32 mEq/L (ref 19–32)
Calcium: 9.6 mg/dL (ref 8.4–10.5)
Chloride: 100 mEq/L (ref 96–112)
Creatinine, Ser: 1.11 mg/dL (ref 0.40–1.50)
GFR: 57.77 mL/min — ABNORMAL LOW (ref >60.00)
Glucose, Bld: 107 mg/dL — ABNORMAL HIGH (ref 70–99)
Potassium: 4.3 mEq/L (ref 3.5–5.1)
Sodium: 142 mEq/L (ref 135–145)

## 2020-11-19 MED ORDER — NYSTATIN 100000 UNIT/GM EX CREA: 1.0000 "application " | TOPICAL_CREAM | Freq: Two times a day (BID) | CUTANEOUS | 0 refills | Status: DC

## 2020-11-19 NOTE — Progress Notes (Signed)
Patient ID: [name], male   DOB: 05-06-1926, 87 y.o.   MRN: [id number]   Subjective:    Patient ID: [name], male    DOB: 1926-01-02, 87 y.o.   MRN: [id number]  HPI This visit occurred during the SARS-CoV-2 public health emergency.  Safety protocols were in place, including screening questions prior to the visit, additional usage of staff PPE, and extensive cleaning of exam room while observing appropriate contact time as indicated for disinfecting solutions.  Patient here for his physical exam. Reports he has been doing relatively well.  Has noticed what he describes as feeling like his rectum is swollen.  States has been intermittent for a couple of months.  Has noticed some blood when wiping and will occasionally notice some blood in the toilet water.  Bowels are moving.  No blood in his urine.  No abdominal pain.  Eating.  No nausea or vomiting.  Feels his breathing is stable.  No increased cough or congestion.  No chest pain.    Past Medical History:  Diagnosis Date  . Abnormal chest CT 03/28/2017  . Allergy   . Anemia   . Angina pectoris ([place]) 04/02/2011  . Aortic aneurysm ([place]) 03/28/2017   Saw Dr Genevive Bi 04/16/17 - recommended f/u CT in 3 months.    . Atherosclerosis of both carotid arteries 07/17/2014  . Benign lipomatous neoplasm of skin and subcutaneous tissue of left arm 10/04/2013  . Bilateral carotid artery stenosis 08/30/2014   Overview:  Less than 50% 2015  . Bladder cancer ([place]) 03/05/2013  . CAD (coronary artery disease) 06/18/2014  . Cancer ([place]) 11-24-12   bladder. BCG treatments by Dr Jacqlyn Larsen.  . Chicken pox   . Chronic prostatitis 09/30/2012  . Colon polyp   . Congestive heart failure ([place]) 04/20/2014   Overview:  Overview:  With anterior mi and moderate lv dyfunction ef 35%  . Dizziness 12/22/2016  . 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 Eaton-Lambert syndrome ([place])   . Elevated prostate specific antigen (PSA) 09/30/2012  . Essential hypertension,  benign   . Gross hematuria 09/30/2012  . Herpes zoster 11/28/2011   Overview:  with ocular involvement OD  . Hypercholesterolemia   . Hypertension, benign 04/02/2011  . Hypotension   . Moderate mitral insufficiency 05/18/2015  . Moderate tricuspid insufficiency 05/18/2015  . Myasthenia gravis ([place])   . Myasthenia gravis without exacerbation ([place]) 03/31/2011  . Shingles 2013  . Squamous cell carcinoma of skin 11/22/2015   Left cheek. WD SCC. Re-shaved 01/14/2016. Excised 02/05/2016, margins free.  Marland Kitchen Squamous cell carcinoma of skin 11/03/2018   Right cheek ant. to sideburn. Poorly differentiated.  . Squamous cell carcinoma of skin 02/02/2019   Right mid dorsum forearm. WD SCC. EDC.   Past Surgical History:  Procedure Laterality Date  . APPENDECTOMY  1958  . CHOLECYSTECTOMY  1979  . HERNIA REPAIR  1970  . TONSILLECTOMY  1958   Family History  Problem Relation Age of Onset  . Lung cancer Sister   . Prostate cancer Brother   . Lung cancer Brother   . Arthritis Other        parent  . Colon cancer Neg Hx    Social History   Socioeconomic History  . Marital status: Married    Spouse name: Not on file  . Number of children: Not on file  . Years of education: Not on file  . Highest education level: Not on file  Occupational History  . Not  on file  Tobacco Use  . Smoking status: Former Smoker    Quit date: 09/29/1958    Years since quitting: 62.2  . Smokeless tobacco: Never Used  Vaping Use  . Vaping Use: Never used  Substance and Sexual Activity  . Alcohol use: Yes    Alcohol/week: 0.0 standard drinks    Comment: occasionally  . Drug use: No  . Sexual activity: Never  Other Topics Concern  . Not on file  Social History Narrative   Pt is married with 2 children - 1 son, 1 daughter. Previously self-employed in [employer]   Social Determinants of Health   Financial Resource Strain: [place]   . Difficulty of Paying Living Expenses: Not hard at all  Food Insecurity: No  Food Insecurity  . Worried About Charity fundraiser in the Last Year: Never true  . Ran Out of Food in the Last Year: Never true  Transportation Needs: No Transportation Needs  . Lack of Transportation (Medical): No  . Lack of Transportation (Non-Medical): No  Physical Activity: Not on file  Stress: No Stress Concern Present  . Feeling of Stress : Not at all  Social Connections: Unknown  . Frequency of Communication with Friends and Family: More than three times a week  . Frequency of Social Gatherings with Friends and Family: More than three times a week  . Attends Religious Services: Not on file  . Active Member of Clubs or Organizations: Not on file  . Attends Archivist Meetings: Not on file  . Marital Status: Widowed    Outpatient Encounter Medications as of 11/19/2020  Medication Sig  . nystatin cream (MYCOSTATIN) Apply 1 application topically 2 (two) times daily.  Marland Kitchen aspirin 81 MG tablet Take 81 mg by mouth daily.  . finasteride (PROSCAR) 5 MG tablet Take 5 mg by mouth daily.  . furosemide (LASIX) 40 MG tablet Take 1 tablet (40 mg total) by mouth daily.  . hydroxypropyl methylcellulose / hypromellose (ISOPTO TEARS / GONIOVISC) 2.5 % ophthalmic solution Place 1 drop into both eyes daily.  Marland Kitchen lisinopril (ZESTRIL) 5 MG tablet Hold until outpatient followup since your blood pressure was on the low side in the hospital.  . mycophenolate (CELLCEPT) 250 MG capsule Take 1,000 mg by mouth 2 (two) times daily.   . pravastatin (PRAVACHOL) 20 MG tablet Take 1 tablet (20 mg total) by mouth daily.  . prednisoLONE acetate (PRED FORTE) 1 % ophthalmic suspension Place 1 drop into the right eye daily. Per patient.  . Probiotic Product (ALIGN) 4 MG CAPS One capsule q day  . pyridostigmine (MESTINON) 60 MG tablet Take 30 mg by mouth 6 (six) times daily.   . tamsulosin (FLOMAX) 0.4 MG CAPS Take 0.4 mg by mouth daily.   No facility-administered encounter medications on file as of 11/19/2020.     Review of Systems  Constitutional: Negative for appetite change and unexpected weight change.  HENT: Negative for congestion, sinus pressure and sore throat.   Eyes: Negative for pain and visual disturbance.  Respiratory: Negative for cough and chest tightness.        Breathing stable.   Cardiovascular: Negative for chest pain and palpitations.       Chronic leg swelling.    Gastrointestinal: Negative for abdominal pain, diarrhea, nausea and vomiting.  Genitourinary: Negative for difficulty urinating and dysuria.  Musculoskeletal: Negative for joint swelling and myalgias.  Skin: Negative for color change, rash and wound.  Neurological: Negative for dizziness,  light-headedness and headaches.  Hematological: Negative for adenopathy. Does not bruise/bleed easily.  Psychiatric/Behavioral: Negative for agitation and dysphoric mood.       Objective:    Physical Exam Vitals reviewed.  Constitutional:      General: He is not in acute distress.    Appearance: Normal appearance. He is well-developed and well-nourished.  HENT:     Head: Normocephalic and atraumatic.     Right Ear: External ear normal.     Left Ear: External ear normal.     Mouth/Throat:     Mouth: Oropharynx is clear and moist.  Eyes:     General: No scleral icterus.       Right eye: No discharge.        Left eye: No discharge.     Conjunctiva/sclera: Conjunctivae normal.  Neck:     Thyroid: No thyromegaly.  Cardiovascular:     Rate and Rhythm: Normal rate and regular rhythm.  Pulmonary:     Effort: No respiratory distress.     Breath sounds: Normal breath sounds. No wheezing.  Abdominal:     General: Bowel sounds are normal.     Palpations: Abdomen is soft.     Tenderness: There is no abdominal tenderness.  Genitourinary:    Comments: Rectal exam:  Minimal erythema peri rectal region.  No external hemorrhoid.  Rectal exam - heme positive. Could not appreciate mass or gross blood.   Musculoskeletal:         General: No edema.     Cervical back: Neck supple. No tenderness.     Comments: Increased pedal and lower extremity swelling.   Lymphadenopathy:     Cervical: No cervical adenopathy.  Skin:    Findings: No rash.     Comments: Venostasis changes - lower extremity.  Increased erythema.  Neurological:     Mental Status: He is alert and oriented to person, place, and time.  Psychiatric:        Mood and Affect: Mood and affect and mood normal.        Behavior: Behavior normal.     BP 128/70   Pulse 69   Temp 97.6 F (36.4 C) (Oral)   Resp 16   Ht _0  (1.778 m)   Wt 211 lb (95.7 kg)   SpO2 97%   BMI 30.28 kg/m  Wt Readings from Last 3 Encounters:  11/19/20 211 lb (95.7 kg)  11/16/20 208 lb (94.3 kg)  10/31/20 211 lb 2 oz (95.8 kg)     Lab Results  Component Value Date   WBC 4.8 11/15/2020   HGB 13.6 11/15/2020   HCT 42.4 11/15/2020   PLT 158.0 11/15/2020   GLUCOSE 107 (H) 11/19/2020   CHOL 159 11/15/2020   TRIG 74.0 11/15/2020   HDL 57.20 11/15/2020   LDLDIRECT 143.5 08/26/2013   LDLCALC 87 11/15/2020   ALT 9 11/15/2020   AST 12 11/15/2020   NA 142 11/19/2020   K 4.3 11/19/2020   CL 100 11/19/2020   CREATININE 1.11 11/19/2020   BUN 20 11/19/2020   CO2 32 11/19/2020   TSH 2.65 02/13/2020   HGBA1C 5.9 11/15/2020    ECHOCARDIOGRAM COMPLETE  Result Date: 01/16/2020    ECHOCARDIOGRAM REPORT   Patient Name:   TRAYLON SCHIMMING Date of Exam: 01/15/2020 Medical Rec #:  [id number]       Height:       70.0 in Accession #:    [id number]      Weight:  201.3 lb Date of Birth:  1925-10-10       BSA:          2.093 m Patient Age:    63 years        BP:           110/66 mmHg Patient Gender: M               HR:           65 bpm. Exam Location:  [facility] Procedure: 2D Echo and Intracardiac Opacification Agent Indications:     Dyspnea  History:         Patient has no prior history of Echocardiogram examinations.                  CHF, Previous Myocardial Infarction and CAD; Risk                   Factors:Hypertension.  Sonographer:     Raliegh Ip [place]-Maynard Referring Phys:  [id number] [place] Diagnosing Phys: Yolonda Kida MD IMPRESSIONS  1. Ant/Apical/Septal hypo.  2. Left ventricular ejection fraction, by estimation, is 30 to 35%. The left ventricle has moderately decreased function. The left ventricle demonstrates regional wall motion abnormalities (see scoring diagram/findings for description). The left ventricular internal cavity size was mildly dilated. Left ventricular diastolic parameters are consistent with Grade III diastolic dysfunction (restrictive).  3. Right ventricular systolic function is moderately reduced. The right ventricular size is moderately enlarged. Mildly increased right ventricular wall thickness. There is moderately elevated pulmonary artery systolic pressure.  4. Left atrial size was mildly dilated.  5. Right atrial size was mildly dilated.  6. The mitral valve is grossly normal. Mild mitral valve regurgitation.  7. Tricuspid valve regurgitation is moderate.  8. The aortic valve is grossly normal. Aortic valve regurgitation is not visualized. FINDINGS  Left Ventricle: Left ventricular ejection fraction, by estimation, is 30 to 35%. The left ventricle has moderately decreased function. The left ventricle demonstrates regional wall motion abnormalities. Definity contrast agent was given IV to delineate the left ventricular endocardial borders. The left ventricular internal cavity size was mildly dilated. There is no left ventricular hypertrophy. Left ventricular diastolic parameters are consistent with Grade III diastolic dysfunction (restrictive).  LV Wall Scoring: The entire apex is hypokinetic. Right Ventricle: The right ventricular size is moderately enlarged. Mildly increased right ventricular wall thickness. Right ventricular systolic function is moderately reduced. There is moderately elevated pulmonary artery systolic pressure. The tricuspid regurgitant velocity  is 3.65 m/s, and with an assumed right atrial pressure of 10 mmHg, the estimated right ventricular systolic pressure is 44.6 mmHg. Left Atrium: Left atrial size was mildly dilated. Right Atrium: Right atrial size was mildly dilated. Pericardium: There is no evidence of pericardial effusion. Mitral Valve: The mitral valve is grossly normal. Mild mitral valve regurgitation. Tricuspid Valve: The tricuspid valve is grossly normal. Tricuspid valve regurgitation is moderate. Aortic Valve: The aortic valve is grossly normal. Aortic valve regurgitation is not visualized. Aortic valve mean gradient measures 11.0 mmHg. Aortic valve peak gradient measures 21.5 mmHg. Aortic valve area, by VTI measures 1.34 cm. Pulmonic Valve: The pulmonic valve was grossly normal. Pulmonic valve regurgitation is not visualized. Aorta: The aortic root is normal in size and structure. IAS/Shunts: No atrial level shunt detected by color flow Doppler. Additional Comments: Ant/Apical/Septal hypo.  LEFT VENTRICLE PLAX 2D LVIDd:         5.42 cm      Diastology LVIDs:  4.53 cm      LV e' lateral:   4.12 cm/s LV PW:         1.14 cm      LV E/e' lateral: 16.2 LV IVS:        1.34 cm      LV e' medial:    3.70 cm/s LVOT diam:     2.30 cm      LV E/e' medial:  18.1 LV SV:         67 LV SV Index:   32 LVOT Area:     4.15 cm  LV Volumes (MOD) LV vol d, MOD A2C: 142.0 ml LV vol d, MOD A4C: 183.0 ml LV vol s, MOD A2C: 122.0 ml LV vol s, MOD A4C: 128.0 ml LV SV MOD A2C:     20.0 ml LV SV MOD A4C:     183.0 ml LV SV MOD BP:      31.2 ml RIGHT VENTRICLE RV S prime:     15.50 cm/s TAPSE (M-mode): 2.7 cm LEFT ATRIUM             Index LA diam:        3.90 cm 1.86 cm/m LA Vol (A2C):   71.0 ml 33.92 ml/m LA Vol (A4C):   80.9 ml 38.65 ml/m LA Biplane Vol: 75.6 ml 36.12 ml/m  AORTIC VALVE AV Area (Vmax):    1.23 cm AV Area (Vmean):   1.31 cm AV Area (VTI):     1.34 cm AV Vmax:           232.00 cm/s AV Vmean:          158.000 cm/s AV VTI:            0.499  m AV Peak Grad:      21.5 mmHg AV Mean Grad:      11.0 mmHg LVOT Vmax:         68.70 cm/s LVOT Vmean:        49.900 cm/s LVOT VTI:          0.161 m LVOT/AV VTI ratio: 0.32  AORTA Ao Root diam: 4.00 cm MITRAL VALVE                TRICUSPID VALVE MV Area (PHT): 3.49 cm     TR Peak grad:   53.3 mmHg MV E velocity: 66.90 cm/s   TR Vmax:        365.00 cm/s MV A velocity: 122.00 cm/s MV E/A ratio:  0.55         SHUNTS                             Systemic VTI:  0.16 m                             Systemic Diam: 2.30 cm Dwayne Prince [place] MD Electronically signed by Yolonda Kida MD Signature Date/Time: 01/16/2020/7:53:27 AM    Final        Assessment & Plan:   Problem List Items Addressed This Visit    Anemia    Follow cbc.       Aortic aneurysm ([place])    Evaluated by Dr Genevive Bi previously.  Stable.  Recommended f/u on an as needed basis.        CAD (coronary artery disease)    Continue risk factor modification.  No  pain.  No change in breathing.       Chronic systolic CHF (congestive heart failure), NYHA class 3 (HCC)    ECHO - EF 30-35%.  Continue lasix and lisinopril.  Breathing stable.  Weight stable from last few checks.  Follow.       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  Stable.       History of bladder cancer    Has been followed by urology.  Noticed blood recently in toilet water.  Feels is coming from rectal irritation.  Check urine to confirm no blood.        Hypercholesterolemia    Continue pravastatin.  Low cholesterol diet and exercise.  Follow lipid panel and liver function tests.        Hyperglycemia    Low carb diet and exercise.  Follow met b and a1c.        Hypertension, benign    Blood pressure doing well on lisinopril and lasix.  Follow pressures and metabolic panel.        Lymphedema    Has previously been seeing [facility].  Saw dermatology recently.  Referred to lymphedema clinic.  Confirm they have his referral information.  He will  call and make appt.       Relevant Orders   Basic metabolic panel (Completed)   Myasthenia gravis without exacerbation ([place])    Followed by neurology.  Stable.       Rectal irritation    States has noticed what feels like his rectum is swollen.  On exam, some minimal peri rectal erythema.  No hemorrhoid.  No mass.  Heme positive.  Discussed further w/up.  Given nystatin cream to help with the redness/irritation.  Call with udpate.  Further w/up pending.        Venous insufficiency of both lower extremities    Has been followed by [facility] previously.  Just saw dermatology.  Was referred to lymphedema clinic.  Number given.  He plans to call to make appt.  Confirm they have his information for referral.           [name], MD

## 2020-11-25 ENCOUNTER — Encounter: Payer: Self-pay | Admitting: Internal Medicine

## 2020-11-25 DIAGNOSIS — K6289 Other specified diseases of anus and rectum: Secondary | ICD-10-CM | POA: Insufficient documentation

## 2020-11-25 NOTE — Assessment & Plan Note (Signed)
Followed by neurology.  Stable  

## 2020-11-25 NOTE — Assessment & Plan Note (Signed)
Has been followed by [facility] previously.  Just saw dermatology.  Was referred to lymphedema clinic.  Number given.  He plans to call to make appt.  Confirm they have his information for referral.

## 2020-11-25 NOTE — Assessment & Plan Note (Signed)
Continue risk factor modification.  No pain.  No change in breathing.

## 2020-11-25 NOTE — Assessment & Plan Note (Signed)
Blood pressure doing well on lisinopril and lasix.  Follow pressures and metabolic panel.

## 2020-11-25 NOTE — Assessment & Plan Note (Signed)
Has been followed by urology.  Noticed blood recently in toilet water.  Feels is coming from rectal irritation.  Check urine to confirm no blood.

## 2020-11-25 NOTE — Assessment & Plan Note (Signed)
Evaluated by Dr Genevive Bi previously.  Stable.  Recommended f/u on an as needed basis.

## 2020-11-25 NOTE — Assessment & Plan Note (Signed)
Low carb diet and exercise.  Follow met b and a1c.   

## 2020-11-25 NOTE — Assessment & Plan Note (Signed)
ECHO - EF 30-35%.  Continue lasix and lisinopril.  Breathing stable.  Weight stable from last few checks.  Follow.

## 2020-11-25 NOTE — Assessment & Plan Note (Signed)
Has previously been seeing [facility].  Saw dermatology recently.  Referred to lymphedema clinic.  Confirm they have his referral information.  He will call and make appt.

## 2020-11-25 NOTE — Assessment & Plan Note (Signed)
Continue pravastatin.  Low cholesterol diet and exercise.  Follow lipid panel and liver function tests.   

## 2020-11-25 NOTE — Assessment & Plan Note (Signed)
States has noticed what feels like his rectum is swollen.  On exam, some minimal peri rectal erythema.  No hemorrhoid.  No mass.  Heme positive.  Discussed further w/up.  Given nystatin cream to help with the redness/irritation.  Call with udpate.  Further w/up pending.

## 2020-11-25 NOTE — Assessment & Plan Note (Signed)
Follow cbc.  

## 2020-12-03 ENCOUNTER — Ambulatory Visit: Payer: Medicare Other | Attending: Family | Admitting: Family

## 2020-12-03 ENCOUNTER — Encounter: Payer: Self-pay | Admitting: Family

## 2020-12-03 ENCOUNTER — Other Ambulatory Visit: Payer: Self-pay

## 2020-12-03 VITALS — BP 130/70 | HR 65 | Resp 18 | Ht 70.0 in | Wt 204.0 lb

## 2020-12-03 DIAGNOSIS — I890 Lymphedema, not elsewhere classified: Secondary | ICD-10-CM | POA: Diagnosis not present

## 2020-12-03 DIAGNOSIS — Z87891 Personal history of nicotine dependence: Secondary | ICD-10-CM | POA: Insufficient documentation

## 2020-12-03 DIAGNOSIS — R0602 Shortness of breath: Secondary | ICD-10-CM | POA: Diagnosis not present

## 2020-12-03 DIAGNOSIS — Z7982 Long term (current) use of aspirin: Secondary | ICD-10-CM | POA: Insufficient documentation

## 2020-12-03 DIAGNOSIS — R531 Weakness: Secondary | ICD-10-CM | POA: Diagnosis not present

## 2020-12-03 DIAGNOSIS — R5383 Other fatigue: Secondary | ICD-10-CM | POA: Insufficient documentation

## 2020-12-03 DIAGNOSIS — Z881 Allergy status to other antibiotic agents status: Secondary | ICD-10-CM | POA: Insufficient documentation

## 2020-12-03 DIAGNOSIS — I110 Hypertensive heart disease with heart failure: Secondary | ICD-10-CM | POA: Insufficient documentation

## 2020-12-03 DIAGNOSIS — Z882 Allergy status to sulfonamides status: Secondary | ICD-10-CM | POA: Diagnosis not present

## 2020-12-03 DIAGNOSIS — I10 Essential (primary) hypertension: Secondary | ICD-10-CM

## 2020-12-03 DIAGNOSIS — I5022 Chronic systolic (congestive) heart failure: Secondary | ICD-10-CM | POA: Diagnosis not present

## 2020-12-03 NOTE — Progress Notes (Signed)
Patient ID: [name], male    DOB: Nov 27, 1925, 87 y.o.   MRN: [id number]   [name] is a 87 y/o male with a history of HTN, hyperlipidemia, CAD, carotid disease, bladder cancer, anemia, Lambert-Eaton syndrome, myasthenia gravis, previous tobacco use and chronic heart failure.   Echo report from 01/15/20 reviewed and showed an EF of 30-35% along with moderately elevated PA pressure, mild Joel, mild AS and moderate TR.  Catheterization done 04/06/14.  Has not been admitted or been in the ED in the last 6 months.   He presents today for a follow-up visit with a chief complaint of minimal fatigue upon moderate exertion. He describes this as chronic in nature having been present for several years. He has associated shortness of breath and lymphedema along with this. He denies any difficulty sleeping, dizziness, abdominal distention, palpitations, chest pain, cough or weight gain.   Has lymphedema clinic appointment scheduled for 12/31/20. Has only taken additional furosemide once since he was last here.   Past Medical History:  Diagnosis Date  . Abnormal chest CT 03/28/2017  . Allergy   . Anemia   . Angina pectoris ([place]) 04/02/2011  . Aortic aneurysm ([place]) 03/28/2017   Saw Dr Genevive Bi 04/16/17 - recommended f/u CT in 3 months.    . Atherosclerosis of both carotid arteries 07/17/2014  . Benign lipomatous neoplasm of skin and subcutaneous tissue of left arm 10/04/2013  . Bilateral carotid artery stenosis 08/30/2014   Overview:  Less than 50% 2015  . Bladder cancer ([place]) 03/05/2013  . CAD (coronary artery disease) 06/18/2014  . Cancer ([place]) 11-24-12   bladder. BCG treatments by Dr Jacqlyn Larsen.  . Chicken pox   . Chronic prostatitis 09/30/2012  . Colon polyp   . Congestive heart failure ([place]) 04/20/2014   Overview:  Overview:  With anterior mi and moderate lv dyfunction ef 35%  . Dizziness 12/22/2016  . 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 Eaton-Lambert syndrome ([place])   . Elevated prostate  specific antigen (PSA) 09/30/2012  . Essential hypertension, benign   . Gross hematuria 09/30/2012  . Herpes zoster 11/28/2011   Overview:  with ocular involvement OD  . Hypercholesterolemia   . Hypertension, benign 04/02/2011  . Hypotension   . Moderate mitral insufficiency 05/18/2015  . Moderate tricuspid insufficiency 05/18/2015  . Myasthenia gravis ([place])   . Myasthenia gravis without exacerbation ([place]) 03/31/2011  . Shingles 2013  . Squamous cell carcinoma of skin 11/22/2015   Left cheek. WD SCC. Re-shaved 01/14/2016. Excised 02/05/2016, margins free.  Marland Kitchen Squamous cell carcinoma of skin 11/03/2018   Right cheek ant. to sideburn. Poorly differentiated.  . Squamous cell carcinoma of skin 02/02/2019   Right mid dorsum forearm. WD SCC. EDC.   Past Surgical History:  Procedure Laterality Date  . APPENDECTOMY  1958  . CHOLECYSTECTOMY  1979  . HERNIA REPAIR  1970  . TONSILLECTOMY  1958   Family History  Problem Relation Age of Onset  . Lung cancer Sister   . Prostate cancer Brother   . Lung cancer Brother   . Arthritis Other        parent  . Colon cancer Neg Hx    Social History   Tobacco Use  . Smoking status: Former Smoker    Quit date: 09/29/1958    Years since quitting: 62.2  . Smokeless tobacco: Never Used  Substance Use Topics  . Alcohol use: Yes    Alcohol/week: 0.0 standard drinks    Comment: occasionally  Allergies  Allergen Reactions  . Beta Adrenergic Blockers Other (See Comments)    Beta Blocker will precipitate acute weakness in Lambert-Eaton Syndrome or Myasthenia Gravis  . Calcium Channel Blockers Other (See Comments)    Calcium Channel Blocker- will precipitate acute weakness in Lambert-Eaton Syndrome.  . Ciprofloxacin Other (See Comments)    Last resort due to Myasthia Gravis  . Sulfa Antibiotics Rash   Prior to Admission medications   Medication Sig Start Date End Date Taking? Authorizing Provider  aspirin 81 MG tablet Take 81 mg by mouth daily.   Yes  [provider]  finasteride (PROSCAR) 5 MG tablet Take 5 mg by mouth daily.   Yes [provider]  furosemide (LASIX) 40 MG tablet Take 1 tablet (40 mg total) by mouth daily. 04/03/20 07/02/20 Yes Rayetta Veith, Otila Kluver A, FNP  hydroxypropyl methylcellulose / hypromellose (ISOPTO TEARS / GONIOVISC) 2.5 % ophthalmic solution Place 1 drop into both eyes daily.   Yes [provider]  lisinopril (ZESTRIL) 5 MG tablet Hold until outpatient followup since your blood pressure was on the low side in the hospital. 01/15/20  Yes Enzo Bi, MD  mycophenolate (CELLCEPT) 250 MG capsule Take 1,000 mg by mouth 2 (two) times daily.    Yes [provider]  nystatin cream (MYCOSTATIN) Apply 1 application topically 2 (two) times daily. 11/19/20  Yes [name], MD  pravastatin (PRAVACHOL) 20 MG tablet Take 1 tablet (20 mg total) by mouth daily. 07/27/14  Yes [name], MD  prednisoLONE acetate (PRED FORTE) 1 % ophthalmic suspension Place 1 drop into the right eye daily. Per patient. 01/15/20  Yes Enzo Bi, MD  Probiotic Product (ALIGN) 4 MG CAPS One capsule q day 06/30/19  Yes [name], MD  pyridostigmine (MESTINON) 60 MG tablet Take 30 mg by mouth 6 (six) times daily.    Yes [provider]  tamsulosin (FLOMAX) 0.4 MG CAPS Take 0.4 mg by mouth daily.   Yes [provider]    Review of Systems  Constitutional: Positive for fatigue. Negative for appetite change.  HENT: Negative for congestion, rhinorrhea and sore throat.   Eyes: Negative.   Respiratory: Positive for shortness of breath. Negative for cough and wheezing.   Cardiovascular: Positive for leg swelling. Negative for chest pain and palpitations.  Gastrointestinal: Negative for abdominal distention and abdominal pain.  Endocrine: Negative.   Genitourinary: Negative.   Musculoskeletal: Negative for arthralgias, back pain and neck pain.  Skin: Positive for color change (chronic redness lower legs).   Allergic/Immunologic: Negative.   Neurological: Negative for dizziness and light-headedness.  Hematological: Negative for adenopathy. Does not bruise/bleed easily.  Psychiatric/Behavioral: Negative for dysphoric mood and sleep disturbance (sleeping on 1-2 pillows). The patient is not nervous/anxious.    Vitals:   12/03/20 1021  BP: 130/70  Pulse: 65  Resp: 18  SpO2: 96%  Weight: 204 lb (92.5 kg)  Height: 5\' 10"  (1.778 m)   Wt Readings from Last 3 Encounters:  12/03/20 204 lb (92.5 kg)  11/19/20 211 lb (95.7 kg)  11/16/20 208 lb (94.3 kg)   Lab Results  Component Value Date   CREATININE 1.11 11/19/2020   CREATININE 1.13 11/15/2020   CREATININE 1.11 07/16/2020    Physical Exam Vitals and nursing note reviewed.  Constitutional:      Appearance: Normal appearance.  HENT:     Head: Normocephalic and atraumatic.  Cardiovascular:     Rate and Rhythm: Normal rate and regular rhythm.  Pulmonary:  Effort: Pulmonary effort is normal. No respiratory distress.     Breath sounds: No wheezing or rales.  Abdominal:     General: There is no distension.     Palpations: Abdomen is soft.  Musculoskeletal:        General: No tenderness.     Cervical back: Normal range of motion and neck supple.     Right lower leg: Edema (1+ pitting) present.     Left lower leg: Edema (2+ pitting) present.  Skin:    General: Skin is warm and dry.  Neurological:     General: No focal deficit present.     Mental Status: He is alert and oriented to person, place, and time.  Psychiatric:        Mood and Affect: Mood normal.        Behavior: Behavior normal.        Thought Content: Thought content normal.     Assessment & Plan:  1: Chronic heart failure with reduced ejection fraction- - NYHA class II - euvolemic today - weighing daily; reminded to call for an overnight weight gain of >2 pounds or a weekly weight gain of >5 pounds - home weight chart reviewed and shows weight range of  205-208; has taken additional furosemide just once since he was last here - weight down 7 pounds from last visit here 1 month ago  - not adding salt and has been looking at food labels; reminded to closely follow a low sodium diet - unsure if BP could tolerate entresto as it's been low in the past - saw cardiology Nehemiah Massed) 06/07/20 - beta-blockers and calcium channel blockers cause weakness due to Lambert-Eaton syndrome - BNP 01/13/20 was 764.0 - has received all 3 COVID vaccines  2: HTN- - BP looks good today - saw PCP Nicki Reaper) 11/19/20 - BMP 11/19/20 reviewed and showed sodium 142, potassium 4.3, creatinine 1.11 and GFR 57.77  3: Lymphedema- - stage 2 - elevating his legs when sitting for long periods of time  - did get zip up compression socks but he still reports great difficulty in getting them on although he does say that they help some with the swelling - has initial appointment at lymphedema clinic on 12/31/20 - saw vascular Owens Shark) 08/15/20   Patient did not bring his medications nor a list. Each medication was verbally reviewed with the patient and he was encouraged to bring the bottles to every visit to confirm accuracy of list.  Return in 6 months or sooner for any questions/problems before then.

## 2020-12-03 NOTE — Patient Instructions (Signed)
Continue weighing daily and call for an overnight weight gain of > 2 pounds or a weekly weight gain of >5 pounds. 

## 2020-12-04 DIAGNOSIS — I2510 Atherosclerotic heart disease of native coronary artery without angina pectoris: Secondary | ICD-10-CM | POA: Diagnosis not present

## 2020-12-04 DIAGNOSIS — I350 Nonrheumatic aortic (valve) stenosis: Secondary | ICD-10-CM | POA: Diagnosis not present

## 2020-12-04 DIAGNOSIS — I5022 Chronic systolic (congestive) heart failure: Secondary | ICD-10-CM | POA: Diagnosis not present

## 2020-12-04 DIAGNOSIS — I119 Hypertensive heart disease without heart failure: Secondary | ICD-10-CM | POA: Diagnosis not present

## 2020-12-07 ENCOUNTER — Telehealth: Payer: Self-pay | Admitting: Internal Medicine

## 2020-12-07 NOTE — Telephone Encounter (Signed)
Patient was calling to let me know that the referral to Dr Terri Piedra is not needed. He is not having anymore issues. I talked with him about going to [facility] for the lymphedema clinic and he stated that he did not feel comfortable driving that far and would prefer to wait for the 4/4 appt in [facility]

## 2020-12-07 NOTE — Telephone Encounter (Signed)
Patient was returning call for referral for [facility] doctor

## 2020-12-31 ENCOUNTER — Other Ambulatory Visit: Payer: Self-pay

## 2020-12-31 ENCOUNTER — Ambulatory Visit: Payer: Medicare Other | Attending: Dermatology | Admitting: Occupational Therapy

## 2020-12-31 DIAGNOSIS — I890 Lymphedema, not elsewhere classified: Secondary | ICD-10-CM | POA: Diagnosis not present

## 2021-01-01 NOTE — Patient Instructions (Signed)

## 2021-01-01 NOTE — Therapy (Signed)
[place] MAIN [facility] SERVICES [address] [place], [place], [address] Phone: [phone]   Fax:  [phone]  Patient Details  Name: [name] MRN: [id number] Date of Birth: [birth year] Referring Provider:  Ralene Bathe, MD  Encounter Date: 12/31/2020   Andrey Spearman, MS, OTR/L, [facility] 01/01/21 1:42 PM   [place] MAIN [facility] SERVICES [address] [place], [place], [address] Phone: [phone]   Fax:  [phone]

## 2021-01-02 ENCOUNTER — Other Ambulatory Visit: Payer: Self-pay

## 2021-01-02 ENCOUNTER — Ambulatory Visit: Payer: Medicare Other | Admitting: Occupational Therapy

## 2021-01-02 DIAGNOSIS — I890 Lymphedema, not elsewhere classified: Secondary | ICD-10-CM

## 2021-01-02 NOTE — Therapy (Signed)
[place] MAIN [facility] SERVICES [address] [place], [place], [address] Phone: [phone]   Fax:  [phone]  Occupational Therapy Treatment  Patient Details  Name: [name] MRN: [id number] Date of Birth: 1926/01/28 Referring Provider (OT): [name], MD   Encounter Date: 01/02/2021   OT End of Session - 01/02/21 1109    Visit Number 2    Number of Visits 36    Date for OT Re-Evaluation 03/31/21    OT Start Time 1100    OT Stop Time 6063    OT Time Calculation (min) 65 min    Activity Tolerance Patient tolerated treatment well;No increased pain    Behavior During Therapy WFL for tasks assessed/performed           Past Medical History:  Diagnosis Date  . Abnormal chest CT 03/28/2017  . Allergy   . Anemia   . Angina pectoris ([place]) 04/02/2011  . Aortic aneurysm ([place]) 03/28/2017   Saw Dr Genevive Bi 04/16/17 - recommended f/u CT in 3 months.    . Atherosclerosis of both carotid arteries 07/17/2014  . Benign lipomatous neoplasm of skin and subcutaneous tissue of left arm 10/04/2013  . Bilateral carotid artery stenosis 08/30/2014   Overview:  Less than 50% 2015  . Bladder cancer ([place]) 03/05/2013  . CAD (coronary artery disease) 06/18/2014  . Cancer ([place]) 11-24-12   bladder. BCG treatments by Dr Jacqlyn Larsen.  . Chicken pox   . Chronic prostatitis 09/30/2012  . Colon polyp   . Congestive heart failure ([place]) 04/20/2014   Overview:  Overview:  With anterior mi and moderate lv dyfunction ef 35%  . Dizziness 12/22/2016  . 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[place]) 08/30/2012  . Eaton-Lambert syndrome ([place])   . Elevated prostate specific antigen (PSA) 09/30/2012  . Essential hypertension, benign   . Gross hematuria 09/30/2012  . Herpes zoster 11/28/2011   Overview:  with ocular involvement OD  . Hypercholesterolemia   . Hypertension, benign 04/02/2011  . Hypotension   . Moderate mitral insufficiency 05/18/2015  . Moderate tricuspid insufficiency 05/18/2015  .  Myasthenia gravis ([place])   . Myasthenia gravis without exacerbation ([place]) 03/31/2011  . Shingles 2013  . Squamous cell carcinoma of skin 11/22/2015   Left cheek. WD SCC. Re-shaved 01/14/2016. Excised 02/05/2016, margins free.  Marland Kitchen Squamous cell carcinoma of skin 11/03/2018   Right cheek ant. to sideburn. Poorly differentiated.  . Squamous cell carcinoma of skin 02/02/2019   Right mid dorsum forearm. WD SCC. EDC.    Past Surgical History:  Procedure Laterality Date  . APPENDECTOMY  1958  . CHOLECYSTECTOMY  1979  . HERNIA REPAIR  1970  . TONSILLECTOMY  1958    There were no vitals filed for this visit.   Subjective Assessment - 01/02/21 1228    Subjective  [name] presents for OT Rx visit 2 to address BLE lymphedema, L>R. Pt denies leg pain this morning. He has no new complaints since initial evaluation.    Patient is accompanied by: Family member   daughter, [name]   Pertinent History chronic BLE lymphedema (LE) and associated pain/ discomfort. Altered sensation/numbness LLE, Hx lymphorrhea, hx non-pressure related leg ulcer, stasis dermatitis, AORTIC ANEURISM, BILATERAL CAROTID STENOSIS, BLADDER CANCER 03/05/13, CAD, CHF. htn, s/p squamous cell carcinoma 2020 x 2, 20117    Limitations decreased endurance, decreased balance, chronic leg swelling w paihn and hx skin ulcer    Repetition Increases Symptoms    Special Tests +Stemmer bilaterally, FOTO  TBA Rx visit 1    Patient Stated Goals "get this swelling down in about 2 weeks"    Pain Onset Other (comment)   insideous onset ~ 5 yrs ago              LYMPHEDEMA/ONCOLOGY QUESTIONNAIRE - 01/02/21 0001      Lymphedema Assessments   Lymphedema Assessments Lower extremities      Right Lower Extremity Lymphedema   Other R leg limb volume from ankle to tibial tuberosity (A-D) = 4837.6 ml.      Left Lower Extremity Lymphedema   Other L leg limb volume from ankle to tibial tuberosity (A-D) = 3559.1 ml.    Other Limb volume  differential (LVD) measures 26.42%, R>L.                   OT Treatments/Exercises (OP) - 01/02/21 1231      ADLs   ADL Education Given Yes      Manual Therapy   Manual Therapy Edema management;Compression Bandaging    Edema Management initial BLE comparative limb volumetrics    Compression Bandaging LLE knee length   compression wrap using 2 short stretch bandages, 8 and 10 cm, ove stockinett and a single layer of 0.4 cm thick [place] foam. Wraps applied in gradient configuration start at base of toes and extend to popliteal fossa.. No complaints of pain or discomfort with wraps on day 1.                  OT Education - 01/02/21 1234    Education Details Pt edu for outcome of initial limb vlumetrics in relation to volume reduction goal and functional issues, including falls risk and impaired balance due to asymmetry ofd limbs at present    Person(s) Educated Patient;Child(ren)    Methods Explanation;Demonstration;Handout    Comprehension Verbalized understanding;Returned demonstration;Need further instruction               OT Long Term Goals - 01/01/21 1333      OT LONG TERM GOAL #1   Title Given this patient's risk-adjustment variables, and is Intake Functional Status score of /100 on the FOTO tool, patient will experience at least an increase in function of 5 points, or higher.    Baseline TBA    Time 12    Period Weeks    Status New    Target Date 03/31/21      OT LONG TERM GOAL #2   Title Pt will be able to apply knee length, multi-layer, short stretch compression wraps to one leg using correct gradient techniques with extra time (modified independence)  decrease limb volume, to decrease leg pain and limit infection risk, and to improve safe functional ambulation and mobility.    Baseline max a    Time 4    Period Days    Status New    Target Date --   4th OT Rx visit     OT LONG TERM GOAL #3   Title Patient will demonstrate understanding of LE  precautions, prevention strategies and cellulitis signs and symptoms by verbalizing at least 4 examples using  printed reference( modified independence)  to limit infection risk, recurrent wounds and LE progression.    Baseline Max A    Time 4    Period Days    Status New    Target Date --   4th OT Rx visit     OT LONG TERM GOAL #4   Title Pt will  achieve 5%, or greater,  limb volume reduction on the R below the knee, and 10% reduction on the L below the knee (ankle to tibial tuberosity = A-D) during Intensive Phase CDT to achieve optimal limb volume reduction and to prevent re-accumulation of lymphatic congestion and fibrosis in the legs, to limit infection risk, to improve functional ambulation and transfers, to improve functional performance of basic and instrumental ADLs, and to limit LE progression.    Baseline Max A    Time 12    Period Weeks    Status New    Target Date 03/31/21      OT LONG TERM GOAL #5   Title Pt will demonstrate and sustain a least 85% compliance performing all daily LE self-care home program components daily throughout Intensive Phase CDT, including recommended skin care regime, lymphatic pumping ther ex, 23/7 compression wraps and simple self-MLD, to ensure optimal limb volume reduction, to limit infection risk and to limit LE progression.    Baseline Max A    Time 12    Period Weeks    Status New    Target Date 03/31/21      Long Term Additional Goals   Additional Long Term Goals Yes      OT LONG TERM GOAL #6   Title Using assistive devices and additional time  (modified independence)  Pt will be able to don and doff appropriate daytime compression garments and HOS devices to limit lymphatic re-accumulation and LE progression with before transitioning to self-management phase of CDT.    Baseline Max A    Time 12    Period Weeks   issue date   Status New    Target Date 03/31/21                  Patient will benefit from skilled therapeutic  intervention in order to improve the following deficits and impairments:           Visit Diagnosis: Lymphedema, not elsewhere classified    Problem List Patient Active Problem List   Diagnosis Date Noted  . Rectal irritation 11/25/2020  . Lymphedema 11/19/2020  . Pressure injury of right buttock, stage 2 ([place]) 04/23/2020  . Acute exacerbation of CHF (congestive heart failure) ([place]) 01/13/2020  . Lower limb ulcer, calf, left, limited to breakdown of skin ([place]) 12/27/2019  . Dysuria 06/30/2019  . Hyperglycemia 06/30/2019  . LVH (left ventricular hypertrophy) due to hypertensive disease, without heart failure 05/26/2019  . Pseudophakia, left eye 11/26/2018  . Venous insufficiency of both lower extremities 10/13/2018  . Swelling of limb 10/12/2018  . Bilateral lower extremity edema 10/10/2018  . Absent pulse in lower extremity 10/10/2018  . 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 Eye pain 05/31/2018  . Mild aortic valve stenosis 12/03/2017  . Epididymoorchitis 09/02/2017  . Acute cystitis with hematuria 08/11/2017  . Hydrocele, right 08/11/2017  . Aortic aneurysm ([place]) 03/28/2017  . Abnormal chest CT 03/28/2017  . Hypotension   . Dizziness 12/22/2016  . Leg weakness 03/11/2016  . Skin lesion 09/11/2015  . Moderate mitral insufficiency 05/18/2015  . Moderate tricuspid insufficiency 05/18/2015  . Benign essential hypertension 04/25/2015  . Rash 03/10/2015  . Health care maintenance 03/10/2015  . Bilateral carotid artery stenosis 08/30/2014  . Atherosclerosis of both carotid arteries 07/17/2014  . Chronic systolic CHF (congestive heart failure), NYHA class 3 ([place]) 07/17/2014  . CAD (coronary artery disease) 06/18/2014  . CHF (congestive heart failure) ([place]) 04/20/2014  . Congestive  heart failure ([place]) 04/20/2014  . SOB (shortness of breath) 03/10/2014  . Abnormal EKG 03/10/2014  . Mixed hyperlipidemia 03/10/2014  . Benign lipomatous neoplasm of skin and subcutaneous  tissue of left arm 10/04/2013  . Arm skin lesion, left 09/04/2013  . History of shingles 09/04/2013  . History of bladder cancer 03/05/2013  . Chronic prostatitis 09/30/2012  . Elevated prostate specific antigen (PSA) 09/30/2012  . Family history of malignant neoplasm of prostate 09/30/2012  . Gross hematuria 09/30/2012  . Incomplete emptying of bladder 09/30/2012  . Anemia 08/30/2012  . 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[place]) 08/30/2012  . Hypercholesterolemia 08/30/2012  . Herpes zoster 11/28/2011  . Hypertension, benign 04/02/2011  . Myasthenia gravis without exacerbation ([place]) 03/31/2011    Joel Spearman, [name], [name], [facility] 01/02/21 12:42 PM  [place] MAIN [facility] SERVICES [address] [place], [place], [address] Phone: [phone]   Fax:  [phone]  Name: [name] MRN: [id number] Date of Birth: 1925/11/15

## 2021-01-04 ENCOUNTER — Ambulatory Visit: Payer: Medicare Other | Admitting: Occupational Therapy

## 2021-01-07 ENCOUNTER — Other Ambulatory Visit: Payer: Self-pay

## 2021-01-07 ENCOUNTER — Ambulatory Visit: Payer: Medicare Other | Admitting: Occupational Therapy

## 2021-01-07 DIAGNOSIS — I890 Lymphedema, not elsewhere classified: Secondary | ICD-10-CM

## 2021-01-09 ENCOUNTER — Ambulatory Visit: Payer: Medicare Other | Admitting: Occupational Therapy

## 2021-01-09 ENCOUNTER — Other Ambulatory Visit: Payer: Self-pay

## 2021-01-09 DIAGNOSIS — I890 Lymphedema, not elsewhere classified: Secondary | ICD-10-CM

## 2021-01-09 NOTE — Therapy (Addendum)
[place] MAIN [facility] SERVICES [address] [place], [place], [address] Phone: [phone]   Fax:  [phone]  Occupational Therapy Treatment  Patient Details  Name: [name] MRN: [id number] Date of Birth: [birth year] Referring Provider (OT): [name], MD   Encounter Date: 01/07/2021   OT End of Session - 01/08/21 1254    Visit Number 3    Number of Visits 36    Date for OT Re-Evaluation 03/31/21    OT Start Time 1110    OT Stop Time 1230    OT Time Calculation (min) 80 min    Activity Tolerance Patient tolerated treatment well;No increased pain    Behavior During Therapy WFL for tasks assessed/performed           Past Medical History:  Diagnosis Date  . Abnormal chest CT 03/28/2017  . Allergy   . Anemia   . Angina pectoris ([place]) 04/02/2011  . Aortic aneurysm ([place]) 03/28/2017   Saw Dr Genevive Bi 04/16/17 - recommended f/u CT in 3 months.    . Atherosclerosis of both carotid arteries 07/17/2014  . Benign lipomatous neoplasm of skin and subcutaneous tissue of left arm 10/04/2013  . Bilateral carotid artery stenosis 08/30/2014   Overview:  Less than 50% 2015  . Bladder cancer ([place]) 03/05/2013  . CAD (coronary artery disease) 06/18/2014  . Cancer ([place]) 11-24-12   bladder. BCG treatments by Dr Jacqlyn Larsen.  . Chicken pox   . Chronic prostatitis 09/30/2012  . Colon polyp   . Congestive heart failure ([place]) 04/20/2014   Overview:  Overview:  With anterior mi and moderate lv dyfunction ef 35%  . Dizziness 12/22/2016  . 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 Eaton-Lambert syndrome ([place])   . Elevated prostate specific antigen (PSA) 09/30/2012  . Essential hypertension, benign   . Gross hematuria 09/30/2012  . Herpes zoster 11/28/2011   Overview:  with ocular involvement OD  . Hypercholesterolemia   . Hypertension, benign 04/02/2011  . Hypotension   . Moderate mitral insufficiency 05/18/2015  . Moderate tricuspid insufficiency 05/18/2015  .  Myasthenia gravis ([place])   . Myasthenia gravis without exacerbation ([place]) 03/31/2011  . Shingles 2013  . Squamous cell carcinoma of skin 11/22/2015   Left cheek. WD SCC. Re-shaved 01/14/2016. Excised 02/05/2016, margins free.  Marland Kitchen Squamous cell carcinoma of skin 11/03/2018   Right cheek ant. to sideburn. Poorly differentiated.  . Squamous cell carcinoma of skin 02/02/2019   Right mid dorsum forearm. WD SCC. EDC.    Past Surgical History:  Procedure Laterality Date  . APPENDECTOMY  1958  . CHOLECYSTECTOMY  1979  . HERNIA REPAIR  1970  . TONSILLECTOMY  1958    There were no vitals filed for this visit.   Subjective Assessment - 01/09/21 1242    Subjective  Mr [name] presents for OT Rx visit 3 to address BLE lymphedema, L>R. Pt denies leg pain this morning. He states he has a neighbor who may be able to help me be able to wrap. I'll ask her if she can come with me to learn."    Patient is accompanied by: Family member   daughter, [name]   Pertinent History chronic BLE lymphedema (LE) and associated pain/ discomfort. Altered sensation/numbness LLE, Hx lymphorrhea, hx non-pressure related leg ulcer, stasis dermatitis, AORTIC ANEURISM, BILATERAL CAROTID STENOSIS, BLADDER CANCER 03/05/13, CAD, CHF. htn, s/p squamous cell carcinoma 2020 x 2, 20117    Limitations decreased endurance, decreased balance, chronic leg swelling  w paihn and hx skin ulcer    Repetition Increases Symptoms    Special Tests +Stemmer bilaterally, FOTO TBA Rx visit 1    Patient Stated Goals "get this swelling down in about 2 weeks"    Pain Onset Other (comment)   insideous onset ~ 5 yrs ago                       OT Treatments/Exercises (OP) - 01/08/21 1256      ADLs   ADL Education Given Yes      Manual Therapy   Manual Therapy Edema management;Compression Bandaging    Manual Lymphatic Drainage (MLD) MLD to LLE/LLQ utilizing shoret neck sequence ( CLAVICULAR LN ONLY in keeping w CAROTID  precautions), deep abdominal pathway, functional inguinal LN and J strokes from proximal to disial to thigh, leg and dorsal foot. Good tolerance. No SOB at rest.    Compression Bandaging LLE knee length   compression wrap using 2 short stretch bandages, 8 and 10 cm, ove stockinett and a single layer of 0.4 cm thick [place] foam. Wraps applied in gradient configuration start at base of toes and extend to popliteal fossa.. No complaints of pain or discomfort with wraps on day 1.                  OT Education - 01/08/21 1253    Education Details Continued skilled Pt/caregiver education  And LE ADL training throughout visit for lymphedema self care/ home program, including compression wrapping, compression garment and device wear/care, lymphatic pumping ther ex, simple self-MLD, and skin care. Discussed progress towards goals.    Person(s) Educated Patient    Methods Explanation;Demonstration;Handout    Comprehension Verbalized understanding;Returned demonstration;Need further instruction               OT Long Term Goals - 01/01/21 1333      OT LONG TERM GOAL #1   Title Given this patient's risk-adjustment variables, and is Intake Functional Status score of /100 on the FOTO tool, patient will experience at least an increase in function of 5 points, or higher.    Baseline TBA    Time 12    Period Weeks    Status New    Target Date 03/31/21      OT LONG TERM GOAL #2   Title Pt will be able to apply knee length, multi-layer, short stretch compression wraps to one leg using correct gradient techniques with extra time (modified independence)  decrease limb volume, to decrease leg pain and limit infection risk, and to improve safe functional ambulation and mobility.    Baseline max a    Time 4    Period Days    Status New    Target Date --   4th OT Rx visit     OT LONG TERM GOAL #3   Title Patient will demonstrate understanding of LE precautions, prevention strategies and  cellulitis signs and symptoms by verbalizing at least 4 examples using  printed reference( modified independence)  to limit infection risk, recurrent wounds and LE progression.    Baseline Max A    Time 4    Period Days    Status New    Target Date --   4th OT Rx visit     OT LONG TERM GOAL #4   Title Pt will achieve 5%, or greater,  limb volume reduction on the R below the knee, and 10% reduction on the L below the  knee (ankle to tibial tuberosity = A-D) during Intensive Phase CDT to achieve optimal limb volume reduction and to prevent re-accumulation of lymphatic congestion and fibrosis in the legs, to limit infection risk, to improve functional ambulation and transfers, to improve functional performance of basic and instrumental ADLs, and to limit LE progression.    Baseline Max A    Time 12    Period Weeks    Status New    Target Date 03/31/21      OT LONG TERM GOAL #5   Title Pt will demonstrate and sustain a least 85% compliance performing all daily LE self-care home program components daily throughout Intensive Phase CDT, including recommended skin care regime, lymphatic pumping ther ex, 23/7 compression wraps and simple self-MLD, to ensure optimal limb volume reduction, to limit infection risk and to limit LE progression.    Baseline Max A    Time 12    Period Weeks    Status New    Target Date 03/31/21      Long Term Additional Goals   Additional Long Term Goals Yes      OT LONG TERM GOAL #6   Title Using assistive devices and additional time  (modified independence)  Pt will be able to don and doff appropriate daytime compression garments and HOS devices to limit lymphatic re-accumulation and LE progression with before transitioning to self-management phase of CDT.    Baseline Max A    Time 12    Period Weeks   issue date   Status New    Target Date 03/31/21                 Plan - 01/08/21 1254    Clinical Impression Statement Mr Bolds tolerated  LLE/LLQ MLD,  including light fibrosis techniques, and multilayer compression wrapping wthout increased pain. fopot and leg volume decreased af6ter manual therapy by visual assessment. Petting also reduced in depth of indentation and time indented. Good session. Cont as per POC.    OT Occupational Profile and History Comprehensive Assessment- Review of records and extensive additional review of physical, cognitive, psychosocial history related to current functional performance    Occupational performance deficits (Please refer to evaluation for details): ADL's;IADL's;Work;Leisure;Social Participation    Body Structure / Function / Physical Skills ADL;Decreased knowledge of use of DME;Balance;Pain;Edema;Endurance;IADL;Scar mobility;Mobility;Decreased knowledge of precautions;Skin integrity    Rehab Potential Good    Clinical Decision Making Several treatment options, min-mod task modification necessary    Comorbidities Affecting Occupational Performance: Presence of comorbidities impacting occupational performance    Comorbidities impacting occupational performance description: CVI, CAD, hx non-pressure related leg ulcer    Modification or Assistance to Complete Evaluation  Min-Moderate modification of tasks or assist with assess necessary to complete eval    OT Frequency 2x / week    OT Duration 12 weeks   follow aong and PRN for garments   OT Treatment/Interventions Self-care/ADL training;DME and/or AE instruction;Manual lymph drainage;Compression bandaging;Therapeutic activities;Therapeutic exercise;Scar mobilization;Other (comment);Manual Therapy;Patient/family education   skin care with low ph lotion and/ or castor oil to imrove tissue hydration and mobility essential for lymphatic drainage   Plan Complete Decongestive Therapy (CDT) to sigle limb a a time to limit fall risk and impact on function. Pt and CG will be taught to apply multilayer gradient compression wraps below knee to base of toes. CDT also to  include manual lymphatic drainage, skin care, therapeutic exercise and fitting with appropriate knee length compression garments that are comfortable and Pt  is able to don and doff using assistive devices PRN.    Recommended Other Services Consider fitting Pt with advanced sequential pneumatic compression device PRN.    Consulted and Agree with Plan of Care Patient;Family member/caregiver    Family Member Consulted daughter, [name]           Patient will benefit from skilled therapeutic intervention in order to improve the following deficits and impairments:   Body Structure / Function / Physical Skills: ADL,Decreased knowledge of use of DME,Balance,Pain,Edema,Endurance,IADL,Scar mobility,Mobility,Decreased knowledge of precautions,Skin integrity       Visit Diagnosis: Lymphedema, not elsewhere classified    Problem List Patient Active Problem List   Diagnosis Date Noted  . Rectal irritation 11/25/2020  . Lymphedema 11/19/2020  . Pressure injury of right buttock, stage 2 ([place]) 04/23/2020  . Acute exacerbation of CHF (congestive heart failure) ([place]) 01/13/2020  . Lower limb ulcer, calf, left, limited to breakdown of skin ([place]) 12/27/2019  . Dysuria 06/30/2019  . Hyperglycemia 06/30/2019  . LVH (left ventricular hypertrophy) due to hypertensive disease, without heart failure 05/26/2019  . Pseudophakia, left eye 11/26/2018  . Venous insufficiency of both lower extremities 10/13/2018  . Swelling of limb 10/12/2018  . Bilateral lower extremity edema 10/10/2018  . Absent pulse in lower extremity 10/10/2018  . 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 Eye pain 05/31/2018  . Mild aortic valve stenosis 12/03/2017  . Epididymoorchitis 09/02/2017  . Acute cystitis with hematuria 08/11/2017  . Hydrocele, right 08/11/2017  . Aortic aneurysm ([place]) 03/28/2017  . Abnormal chest CT 03/28/2017  . Hypotension   . Dizziness 12/22/2016  . Leg weakness 03/11/2016  . Skin lesion 09/11/2015   . Moderate mitral insufficiency 05/18/2015  . Moderate tricuspid insufficiency 05/18/2015  . Benign essential hypertension 04/25/2015  . Rash 03/10/2015  . Health care maintenance 03/10/2015  . Bilateral carotid artery stenosis 08/30/2014  . Atherosclerosis of both carotid arteries 07/17/2014  . Chronic systolic CHF (congestive heart failure), NYHA class 3 ([place]) 07/17/2014  . CAD (coronary artery disease) 06/18/2014  . CHF (congestive heart failure) ([place]) 04/20/2014  . Congestive heart failure ([place]) 04/20/2014  . SOB (shortness of breath) 03/10/2014  . Abnormal EKG 03/10/2014  . Mixed hyperlipidemia 03/10/2014  . Benign lipomatous neoplasm of skin and subcutaneous tissue of left arm 10/04/2013  . Arm skin lesion, left 09/04/2013  . History of shingles 09/04/2013  . History of bladder cancer 03/05/2013  . Chronic prostatitis 09/30/2012  . Elevated prostate specific antigen (PSA) 09/30/2012  . Family history of malignant neoplasm of prostate 09/30/2012  . Gross hematuria 09/30/2012  . Incomplete emptying of bladder 09/30/2012  . Anemia 08/30/2012  . 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 Hypercholesterolemia 08/30/2012  . Herpes zoster 11/28/2011  . Hypertension, benign 04/02/2011  . Myasthenia gravis without exacerbation ([place]) 03/31/2011    Andrey Spearman, MS, OTR/L, [facility] 01/09/21 12:57 PM  [place] MAIN [facility] SERVICES [address] [place], [place], [address] Phone: [phone]   Fax:  [phone]  Name: [name] MRN: [id number] Date of Birth: 02-11-1926

## 2021-01-10 NOTE — Therapy (Signed)
[place] MAIN [facility] SERVICES [address] [place], [place], [address] Phone: [phone]   Fax:  [phone]  Occupational Therapy Treatment  Patient Details  Name: [name] MRN: [id number] Date of Birth: August 08, 1926 Referring Provider (OT): [name], MD   Encounter Date: 01/09/2021   OT End of Session - 01/09/21 1101    Visit Number 4    Number of Visits 36    Date for OT Re-Evaluation 03/31/21    OT Start Time 1055    OT Stop Time 1215    OT Time Calculation (min) 80 min    Activity Tolerance Patient tolerated treatment well;No increased pain    Behavior During Therapy WFL for tasks assessed/performed           Past Medical History:  Diagnosis Date  . Abnormal chest CT 03/28/2017  . Allergy   . Anemia   . Angina pectoris ([place]) 04/02/2011  . Aortic aneurysm ([place]) 03/28/2017   Saw Dr Genevive Bi 04/16/17 - recommended f/u CT in 3 months.    . Atherosclerosis of both carotid arteries 07/17/2014  . Benign lipomatous neoplasm of skin and subcutaneous tissue of left arm 10/04/2013  . Bilateral carotid artery stenosis 08/30/2014   Overview:  Less than 50% 2015  . Bladder cancer ([place]) 03/05/2013  . CAD (coronary artery disease) 06/18/2014  . Cancer ([place]) 11-24-12   bladder. BCG treatments by Dr Jacqlyn Larsen.  . Chicken pox   . Chronic prostatitis 09/30/2012  . Colon polyp   . Congestive heart failure ([place]) 04/20/2014   Overview:  Overview:  With anterior mi and moderate lv dyfunction ef 35%  . Dizziness 12/22/2016  . 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 Eaton-Lambert syndrome ([place])   . Elevated prostate specific antigen (PSA) 09/30/2012  . Essential hypertension, benign   . Gross hematuria 09/30/2012  . Herpes zoster 11/28/2011   Overview:  with ocular involvement OD  . Hypercholesterolemia   . Hypertension, benign 04/02/2011  . Hypotension   . Moderate mitral insufficiency 05/18/2015  . Moderate tricuspid insufficiency 05/18/2015  .  Myasthenia gravis ([place])   . Myasthenia gravis without exacerbation ([place]) 03/31/2011  . Shingles 2013  . Squamous cell carcinoma of skin 11/22/2015   Left cheek. WD SCC. Re-shaved 01/14/2016. Excised 02/05/2016, margins free.  Marland Kitchen Squamous cell carcinoma of skin 11/03/2018   Right cheek ant. to sideburn. Poorly differentiated.  . Squamous cell carcinoma of skin 02/02/2019   Right mid dorsum forearm. WD SCC. EDC.    Past Surgical History:  Procedure Laterality Date  . APPENDECTOMY  1958  . CHOLECYSTECTOMY  1979  . HERNIA REPAIR  1970  . TONSILLECTOMY  1958    There were no vitals filed for this visit.   Subjective Assessment - 01/09/21 1102    Subjective  [name] presents for OT Rx visit 4 to address BLE lymphedema, L>R. Pt denies leg pain this morning. He states he has a neighbor who may be able to help me be able to wrap. I'll ask her if she can come with me to learn."    Patient is accompanied by: Family member   daughter, [name]   Pertinent History chronic BLE lymphedema (LE) and associated pain/ discomfort. Altered sensation/numbness LLE, Hx lymphorrhea, hx non-pressure related leg ulcer, stasis dermatitis, AORTIC ANEURISM, BILATERAL CAROTID STENOSIS, BLADDER CANCER 03/05/13, CAD, CHF. htn, s/p squamous cell carcinoma 2020 x 2, 20117    Limitations decreased endurance, decreased balance, chronic leg swelling  w paihn and hx skin ulcer    Repetition Increases Symptoms    Special Tests +Stemmer bilaterally, FOTO TBA Rx visit 1    Patient Stated Goals "get this swelling down in about 2 weeks"    Pain Onset Other (comment)   insideous onset ~ 5 yrs ago                       OT Treatments/Exercises (OP) - 01/09/21 1145      ADLs   ADL Education Given Yes      Manual Therapy   Manual Therapy Edema management;Compression Bandaging;Manual Lymphatic Drainage (MLD)    Manual Lymphatic Drainage (MLD) MLD to LLE/LLQ utilizing shoret neck sequence ( CLAVICULAR LN ONLY  in keeping w CAROTID precautions), deep abdominal pathway, functional inguinal LN and J strokes from proximal to disial to thigh, leg and dorsal foot. Good tolerance. No SOB at rest.    Compression Bandaging LLE knee length   compression wrap using 2 short stretch bandages, 8 and 10 cm, ove stockinett and a single layer of 0.4 cm thick [place] foam. Wraps applied in gradient configuration start at base of toes and extend to popliteal fossa.. No complaints of pain or discomfort with wraps on day 1.                  OT Education - 01/09/21 1149    Education Details Pt edu for multi layer gradient compression wraps    Person(s) Educated Patient;Child(ren)    Methods Explanation;Demonstration;Handout    Comprehension Verbalized understanding;Returned demonstration;Need further instruction               OT Long Term Goals - 01/01/21 1333      OT LONG TERM GOAL #1   Title Given this patient's risk-adjustment variables, and is Intake Functional Status score of /100 on the FOTO tool, patient will experience at least an increase in function of 5 points, or higher.    Baseline TBA    Time 12    Period Weeks    Status New    Target Date 03/31/21      OT LONG TERM GOAL #2   Title Pt will be able to apply knee length, multi-layer, short stretch compression wraps to one leg using correct gradient techniques with extra time (modified independence)  decrease limb volume, to decrease leg pain and limit infection risk, and to improve safe functional ambulation and mobility.    Baseline max a    Time 4    Period Days    Status New    Target Date --   4th OT Rx visit     OT LONG TERM GOAL #3   Title Patient will demonstrate understanding of LE precautions, prevention strategies and cellulitis signs and symptoms by verbalizing at least 4 examples using  printed reference( modified independence)  to limit infection risk, recurrent wounds and LE progression.    Baseline Max A    Time 4     Period Days    Status New    Target Date --   4th OT Rx visit     OT LONG TERM GOAL #4   Title Pt will achieve 5%, or greater,  limb volume reduction on the R below the knee, and 10% reduction on the L below the knee (ankle to tibial tuberosity = A-D) during Intensive Phase CDT to achieve optimal limb volume reduction and to prevent re-accumulation of lymphatic congestion and fibrosis in  the legs, to limit infection risk, to improve functional ambulation and transfers, to improve functional performance of basic and instrumental ADLs, and to limit LE progression.    Baseline Max A    Time 12    Period Weeks    Status New    Target Date 03/31/21      OT LONG TERM GOAL #5   Title Pt will demonstrate and sustain a least 85% compliance performing all daily LE self-care home program components daily throughout Intensive Phase CDT, including recommended skin care regime, lymphatic pumping ther ex, 23/7 compression wraps and simple self-MLD, to ensure optimal limb volume reduction, to limit infection risk and to limit LE progression.    Baseline Max A    Time 12    Period Weeks    Status New    Target Date 03/31/21      Long Term Additional Goals   Additional Long Term Goals Yes      OT LONG TERM GOAL #6   Title Using assistive devices and additional time  (modified independence)  Pt will be able to don and doff appropriate daytime compression garments and HOS devices to limit lymphatic re-accumulation and LE progression with before transitioning to self-management phase of CDT.    Baseline Max A    Time 12    Period Weeks   issue date   Status New    Target Date 03/31/21                 Plan - 01/09/21 1026    Clinical Impression Statement Provided LLE MLD, concurrent skin care and applied gradient compression wraps to L leg as established. Edema remains dense with 2+ pitting after manual therapySimulated possible positioning for optimal reach necessary for applying wraps with extra  time. We'll practice next session. Cont as per POC.    OT Occupational Profile and History Comprehensive Assessment- Review of records and extensive additional review of physical, cognitive, psychosocial history related to current functional performance    Occupational performance deficits (Please refer to evaluation for details): ADL's;IADL's;Work;Leisure;Social Participation    Body Structure / Function / Physical Skills ADL;Decreased knowledge of use of DME;Balance;Pain;Edema;Endurance;IADL;Scar mobility;Mobility;Decreased knowledge of precautions;Skin integrity    Rehab Potential Good    Clinical Decision Making Several treatment options, min-mod task modification necessary    Comorbidities Affecting Occupational Performance: Presence of comorbidities impacting occupational performance    Comorbidities impacting occupational performance description: CVI, CAD, hx non-pressure related leg ulcer    Modification or Assistance to Complete Evaluation  Min-Moderate modification of tasks or assist with assess necessary to complete eval    OT Frequency 2x / week    OT Duration 12 weeks   follow aong and PRN for garments   OT Treatment/Interventions Self-care/ADL training;DME and/or AE instruction;Manual lymph drainage;Compression bandaging;Therapeutic activities;Therapeutic exercise;Scar mobilization;Other (comment);Manual Therapy;Patient/family education   skin care with low ph lotion and/ or castor oil to imrove tissue hydration and mobility essential for lymphatic drainage   Plan Complete Decongestive Therapy (CDT) to sigle limb a a time to limit fall risk and impact on function. Pt and CG will be taught to apply multilayer gradient compression wraps below knee to base of toes. CDT also to include manual lymphatic drainage, skin care, therapeutic exercise and fitting with appropriate knee length compression garments that are comfortable and Pt is able to don and doff using assistive devices PRN.     Recommended Other Services Consider fitting Pt with advanced sequential pneumatic compression device PRN.  Consulted and Agree with Plan of Care Patient;Family member/caregiver    Family Member Consulted daughter, [name]           Patient will benefit from skilled therapeutic intervention in order to improve the following deficits and impairments:   Body Structure / Function / Physical Skills: ADL,Decreased knowledge of use of DME,Balance,Pain,Edema,Endurance,IADL,Scar mobility,Mobility,Decreased knowledge of precautions,Skin integrity       Visit Diagnosis: Lymphedema, not elsewhere classified    Problem List Patient Active Problem List   Diagnosis Date Noted  . Rectal irritation 11/25/2020  . Lymphedema 11/19/2020  . Pressure injury of right buttock, stage 2 ([place]) 04/23/2020  . Acute exacerbation of CHF (congestive heart failure) ([place]) 01/13/2020  . Lower limb ulcer, calf, left, limited to breakdown of skin ([place]) 12/27/2019  . Dysuria 06/30/2019  . Hyperglycemia 06/30/2019  . LVH (left ventricular hypertrophy) due to hypertensive disease, without heart failure 05/26/2019  . Pseudophakia, left eye 11/26/2018  . Venous insufficiency of both lower extremities 10/13/2018  . Swelling of limb 10/12/2018  . Bilateral lower extremity edema 10/10/2018  . Absent pulse in lower extremity 10/10/2018  . 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 Eye pain 05/31/2018  . Mild aortic valve stenosis 12/03/2017  . Epididymoorchitis 09/02/2017  . Acute cystitis with hematuria 08/11/2017  . Hydrocele, right 08/11/2017  . Aortic aneurysm ([place]) 03/28/2017  . Abnormal chest CT 03/28/2017  . Hypotension   . Dizziness 12/22/2016  . Leg weakness 03/11/2016  . Skin lesion 09/11/2015  . Moderate mitral insufficiency 05/18/2015  . Moderate tricuspid insufficiency 05/18/2015  . Benign essential hypertension 04/25/2015  . Rash 03/10/2015  . Health care maintenance 03/10/2015  . Bilateral  carotid artery stenosis 08/30/2014  . Atherosclerosis of both carotid arteries 07/17/2014  . Chronic systolic CHF (congestive heart failure), NYHA class 3 ([place]) 07/17/2014  . CAD (coronary artery disease) 06/18/2014  . CHF (congestive heart failure) ([place]) 04/20/2014  . Congestive heart failure ([place]) 04/20/2014  . SOB (shortness of breath) 03/10/2014  . Abnormal EKG 03/10/2014  . Mixed hyperlipidemia 03/10/2014  . Benign lipomatous neoplasm of skin and subcutaneous tissue of left arm 10/04/2013  . Arm skin lesion, left 09/04/2013  . History of shingles 09/04/2013  . History of bladder cancer 03/05/2013  . Chronic prostatitis 09/30/2012  . Elevated prostate specific antigen (PSA) 09/30/2012  . Family history of malignant neoplasm of prostate 09/30/2012  . Gross hematuria 09/30/2012  . Incomplete emptying of bladder 09/30/2012  . Anemia 08/30/2012  . 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 Hypercholesterolemia 08/30/2012  . Herpes zoster 11/28/2011  . Hypertension, benign 04/02/2011  . Myasthenia gravis without exacerbation ([place]) 03/31/2011    Andrey Spearman, MS, OTR/L, [facility] 01/10/21 10:34 AM   [place] MAIN [facility] SERVICES [address] [place], [place], [address] Phone: [phone]   Fax:  [phone]  Name: [name] MRN: [id number] Date of Birth: 1926/05/27

## 2021-01-10 NOTE — Patient Instructions (Signed)
Lymphedema Precautions   Dopler study required prior to participating in Complete Decongestive Therapy (CDT) for lymphedema (LE) care  to rule out DVT   If you experience atypical shortness of breath, or notice any signs /symptoms of skin infection (aka cellulitis) remove all compression wraps/ garments, discontinue manual lymphatic drainage (MLD), and report symptoms to your physician immediately. Discontinue MLD and compression for 72 hours after you take your first oral antibiotic so not to spread the infection.   Lymphedema Self- Care Instructions  1. EXERCISE: Perform lymphatic pumping there ex 2 x a day. While wearing your compression wraps or garments. Perform 10 reps of each exercise bilaterally and be sure to perform them in order. Don;t skip around!  OMIT PARTIAL SIT UP  2. MLD: Perform simple self-Manual Lymphatic Drainage (MLD) at least once a day as directed.  3. WRAPS: Compression wraps are to be worn 23 hrs/ 7 days/wk during Intensive Phase of Complete Decongestive Therapy (CDT).Building tolerance may take time and practice, so don't get discouraged. If bandages begin to feel tight during periods of inactivity and/or during the night, try performing your exercises to loosen them.   4. GARMENTS: During Management Phase CDT your compression garments are to be worn during waking hours when active. Do NOT sleep in your garments!!   5. PUT YOUR FEET UP! Elevate your feet and legs and feet to the level of your heart whenever you are sitting down.   6. SKIN: Carefully monitor skin condition and perform impeccable hygiene daily. Bathe skin with mild soap and water and apply low pH lotion (aka Eucerin ) to improve hydration and limit infection risk.

## 2021-01-11 ENCOUNTER — Ambulatory Visit: Payer: Medicare Other | Admitting: Occupational Therapy

## 2021-01-14 ENCOUNTER — Ambulatory Visit: Payer: Medicare Other | Admitting: Occupational Therapy

## 2021-01-14 ENCOUNTER — Other Ambulatory Visit: Payer: Self-pay

## 2021-01-14 DIAGNOSIS — I890 Lymphedema, not elsewhere classified: Secondary | ICD-10-CM

## 2021-01-14 NOTE — Therapy (Signed)
[facility] SERVICES [address] [place], [place], [address] Phone: [phone]   Fax:  [phone]  Occupational Therapy Treatment  Patient Details  Name: [name] MRN: [id number] Date of Birth: [birth year] Referring Provider (OT): [name], MD   Encounter Date: 01/14/2021   OT End of Session - 01/14/21 1215    Visit Number 6    Number of Visits 36    Date for OT Re-Evaluation 03/31/21    OT Start Time 1110    OT Stop Time 1215    OT Time Calculation (min) 65 min           Past Medical History:  Diagnosis Date  . Abnormal chest CT 03/28/2017  . Allergy   . Anemia   . Angina pectoris ([place]) 04/02/2011  . Aortic aneurysm ([place]) 03/28/2017   Saw Dr Genevive Bi 04/16/17 - recommended f/u CT in 3 months.    . Atherosclerosis of both carotid arteries 07/17/2014  . Benign lipomatous neoplasm of skin and subcutaneous tissue of left arm 10/04/2013  . Bilateral carotid artery stenosis 08/30/2014   Overview:  Less than 50% 2015  . Bladder cancer ([place]) 03/05/2013  . CAD (coronary artery disease) 06/18/2014  . Cancer ([place]) 11-24-12   bladder. BCG treatments by Dr Jacqlyn Larsen.  . Chicken pox   . Chronic prostatitis 09/30/2012  . Colon polyp   . Congestive heart failure ([place]) 04/20/2014   Overview:  Overview:  With anterior mi and moderate lv dyfunction ef 35%  . Dizziness 12/22/2016  . 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 Eaton-Lambert syndrome ([place])   . Elevated prostate specific antigen (PSA) 09/30/2012  . Essential hypertension, benign   . Gross hematuria 09/30/2012  . Herpes zoster 11/28/2011   Overview:  with ocular involvement OD  . Hypercholesterolemia   . Hypertension, benign 04/02/2011  . Hypotension   . Moderate mitral insufficiency 05/18/2015  . Moderate tricuspid insufficiency 05/18/2015  . Myasthenia gravis ([place])   . Myasthenia gravis without exacerbation ([place]) 03/31/2011  . Shingles 2013  . Squamous cell carcinoma of  skin 11/22/2015   Left cheek. WD SCC. Re-shaved 01/14/2016. Excised 02/05/2016, margins free.  Marland Kitchen Squamous cell carcinoma of skin 11/03/2018   Right cheek ant. to sideburn. Poorly differentiated.  . Squamous cell carcinoma of skin 02/02/2019   Right mid dorsum forearm. WD SCC. EDC.    Past Surgical History:  Procedure Laterality Date  . APPENDECTOMY  1958  . CHOLECYSTECTOMY  1979  . HERNIA REPAIR  1970  . TONSILLECTOMY  1958    There were no vitals filed for this visit.   Subjective Assessment - 01/14/21 1216    Subjective  [name] presents for OT Rx visit 6 to address BLE lymphedema, L>R. Pt denies leg pain this morning. He discusses plands to go fishing next weekend , which will likely cause him to miss his next visit on Monday morning. Pt states no one will be available to assist him with applying coompression wraps. OT will look through sample box and see if we have something we could loan for the weekend to enable Pt to go on fishing trip.    Patient is accompanied by: Family member   daughter, Montague Corella   Pertinent History chronic BLE lymphedema (LE) and associated pain/ discomfort. Altered sensation/numbness LLE, Hx lymphorrhea, hx non-pressure related leg ulcer, stasis dermatitis, AORTIC ANEURISM, BILATERAL CAROTID STENOSIS, BLADDER CANCER 03/05/13, CAD, CHF. htn, s/p squamous cell carcinoma 2020 x  2, 20117    Limitations decreased endurance, decreased balance, chronic leg swelling w paihn and hx skin ulcer    Repetition Increases Symptoms    Special Tests +Stemmer bilaterally, FOTO TBA Rx visit 1    Patient Stated Goals "get this swelling down in about 2 weeks"    Pain Onset Other (comment)   insideous onset ~ 5 yrs ago                       OT Treatments/Exercises (OP) - 01/14/21 1236      ADLs   ADL Education Given Yes      Manual Therapy   Manual Therapy Edema management;Compression Bandaging;Manual Lymphatic Drainage (MLD)    Edema Management skin  care to LLE throughout MLD to increase hydration, skin mobility and tissue health    Manual Lymphatic Drainage (MLD) MLD to LLE/LLQ utilizing shoret neck sequence ( CLAVICULAR LN ONLY in keeping w CAROTID precautions), deep abdominal pathway, functional inguinal LN and J strokes from proximal to disial to thigh, leg and dorsal foot. Good tolerance. No SOB at rest.    Compression Bandaging LLE knee length   compression wrap using 2 short stretch bandages, 8 and 10 cm, ove stockinett and a single layer of 0.4 cm thick Ros[place] foam. Wraps applied in gradient configuration start at base of toes and extend to popliteal fossa.. No complaints of pain or discomfort with wraps on day 1.                  OT Education - 01/14/21 1237    Education Details Continued skilled Pt/caregiver education  And LE ADL training throughout visit for lymphedema self care/ home program, including compression wrapping, compression garment and device wear/care, lymphatic pumping ther ex, simple self-MLD, and skin care. Discussed progress towards goals.    Person(s) Educated Patient    Methods Explanation;Demonstration;Handout    Comprehension Verbalized understanding;Returned demonstration;Need further instruction               OT Long Term Goals - 01/01/21 1333      OT LONG TERM GOAL #1   Title Given this patient's risk-adjustment variables, and is Intake Functional Status score of /100 on the FOTO tool, patient will experience at least an increase in function of 5 points, or higher.    Baseline TBA    Time 12    Period Weeks    Status New    Target Date 03/31/21      OT LONG TERM GOAL #2   Title Pt will be able to apply knee length, multi-layer, short stretch compression wraps to one leg using correct gradient techniques with extra time (modified independence)  decrease limb volume, to decrease leg pain and limit infection risk, and to improve safe functional ambulation and mobility.    Baseline max a     Time 4    Period Days    Status New    Target Date --   4th OT Rx visit     OT LONG TERM GOAL #3   Title Patient will demonstrate understanding of LE precautions, prevention strategies and cellulitis signs and symptoms by verbalizing at least 4 examples using  printed reference( modified independence)  to limit infection risk, recurrent wounds and LE progression.    Baseline Max A    Time 4    Period Days    Status New    Target Date --   4th OT Rx visit  OT LONG TERM GOAL #4   Title Pt will achieve 5%, or greater,  limb volume reduction on the R below the knee, and 10% reduction on the L below the knee (ankle to tibial tuberosity = A-D) during Intensive Phase CDT to achieve optimal limb volume reduction and to prevent re-accumulation of lymphatic congestion and fibrosis in the legs, to limit infection risk, to improve functional ambulation and transfers, to improve functional performance of basic and instrumental ADLs, and to limit LE progression.    Baseline Max A    Time 12    Period Weeks    Status New    Target Date 03/31/21      OT LONG TERM GOAL #5   Title Pt will demonstrate and sustain a least 85% compliance performing all daily LE self-care home program components daily throughout Intensive Phase CDT, including recommended skin care regime, lymphatic pumping ther ex, 23/7 compression wraps and simple self-MLD, to ensure optimal limb volume reduction, to limit infection risk and to limit LE progression.    Baseline Max A    Time 12    Period Weeks    Status New    Target Date 03/31/21      Long Term Additional Goals   Additional Long Term Goals Yes      OT LONG TERM GOAL #6   Title Using assistive devices and additional time  (modified independence)  Pt will be able to don and doff appropriate daytime compression garments and HOS devices to limit lymphatic re-accumulation and LE progression with before transitioning to self-management phase of CDT.    Baseline Max A     Time 12    Period Weeks   issue date   Status New    Target Date 03/31/21                 Plan - 01/14/21 1238    Clinical Impression Statement Continued LLE MLD, concurrent skin care and applied gradient compression wraps to L leg as established. Edema remains dense with 2+ pitting after manual therapy again today. Dense pitting edema persists, while skin condition vellow the knees continues to improve. Pt reports he is not yet able to apply wraps independently, which may be why tissue density is substantially increased this morning. OT will search samples for someting appropriate that Pt may be able to use for his upcoming long fishing weekend so he can enjoy social participation and leisure pursuits as well as get a bit of exercise.  Cont as per POC.    OT Occupational Profile and History Comprehensive Assessment- Review of records and extensive additional review of physical, cognitive, psychosocial history related to current functional performance    Occupational performance deficits (Please refer to evaluation for details): ADL's;IADL's;Work;Leisure;Social Participation    Body Structure / Function / Physical Skills ADL;Decreased knowledge of use of DME;Balance;Pain;Edema;Endurance;IADL;Scar mobility;Mobility;Decreased knowledge of precautions;Skin integrity    Rehab Potential Good    Clinical Decision Making Several treatment options, min-mod task modification necessary    Comorbidities Affecting Occupational Performance: Presence of comorbidities impacting occupational performance    Comorbidities impacting occupational performance description: CVI, CAD, hx non-pressure related leg ulcer    Modification or Assistance to Complete Evaluation  Min-Moderate modification of tasks or assist with assess necessary to complete eval    OT Frequency 2x / week    OT Duration 12 weeks   follow aong and PRN for garments   OT Treatment/Interventions Self-care/ADL training;DME and/or AE  instruction;Manual lymph drainage;Compression  bandaging;Therapeutic activities;Therapeutic exercise;Scar mobilization;Other (comment);Manual Therapy;Patient/family education   skin care with low ph lotion and/ or castor oil to imrove tissue hydration and mobility essential for lymphatic drainage   Plan Complete Decongestive Therapy (CDT) to sigle limb a a time to limit fall risk and impact on function. Pt and CG will be taught to apply multilayer gradient compression wraps below knee to base of toes. CDT also to include manual lymphatic drainage, skin care, therapeutic exercise and fitting with appropriate knee length compression garments that are comfortable and Pt is able to don and doff using assistive devices PRN.    Recommended Other Services Consider fitting Pt with advanced sequential pneumatic compression device PRN.    Consulted and Agree with Plan of Care Patient;Family member/caregiver    Family Member Consulted daughter, [name]           Patient will benefit from skilled therapeutic intervention in order to improve the following deficits and impairments:   Body Structure / Function / Physical Skills: ADL,Decreased knowledge of use of DME,Balance,Pain,Edema,Endurance,IADL,Scar mobility,Mobility,Decreased knowledge of precautions,Skin integrity       Visit Diagnosis: Lymphedema, not elsewhere classified    Problem List Patient Active Problem List   Diagnosis Date Noted  . Rectal irritation 11/25/2020  . Lymphedema 11/19/2020  . Pressure injury of right buttock, stage 2 ([place]) 04/23/2020  . Acute exacerbation of CHF (congestive heart failure) ([place]) 01/13/2020  . Lower limb ulcer, calf, left, limited to breakdown of skin ([place]) 12/27/2019  . Dysuria 06/30/2019  . Hyperglycemia 06/30/2019  . LVH (left ventricular hypertrophy) due to hypertensive disease, without heart failure 05/26/2019  . Pseudophakia, left eye 11/26/2018  . Venous insufficiency of both lower extremities  10/13/2018  . Swelling of limb 10/12/2018  . Bilateral lower extremity edema 10/10/2018  . Absent pulse in lower extremity 10/10/2018  . 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 Eye pain 05/31/2018  . Mild aortic valve stenosis 12/03/2017  . Epididymoorchitis 09/02/2017  . Acute cystitis with hematuria 08/11/2017  . Hydrocele, right 08/11/2017  . Aortic aneurysm ([place]) 03/28/2017  . Abnormal chest CT 03/28/2017  . Hypotension   . Dizziness 12/22/2016  . Leg weakness 03/11/2016  . Skin lesion 09/11/2015  . Moderate mitral insufficiency 05/18/2015  . Moderate tricuspid insufficiency 05/18/2015  . Benign essential hypertension 04/25/2015  . Rash 03/10/2015  . Health care maintenance 03/10/2015  . Bilateral carotid artery stenosis 08/30/2014  . Atherosclerosis of both carotid arteries 07/17/2014  . Chronic systolic CHF (congestive heart failure), NYHA class 3 ([place]) 07/17/2014  . CAD (coronary artery disease) 06/18/2014  . CHF (congestive heart failure) ([place]) 04/20/2014  . Congestive heart failure ([place]) 04/20/2014  . SOB (shortness of breath) 03/10/2014  . Abnormal EKG 03/10/2014  . Mixed hyperlipidemia 03/10/2014  . Benign lipomatous neoplasm of skin and subcutaneous tissue of left arm 10/04/2013  . Arm skin lesion, left 09/04/2013  . History of shingles 09/04/2013  . History of bladder cancer 03/05/2013  . Chronic prostatitis 09/30/2012  . Elevated prostate specific antigen (PSA) 09/30/2012  . Family history of malignant neoplasm of prostate 09/30/2012  . Gross hematuria 09/30/2012  . Incomplete emptying of bladder 09/30/2012  . Anemia 08/30/2012  . 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 Hypercholesterolemia 08/30/2012  . Herpes zoster 11/28/2011  . Hypertension, benign 04/02/2011  . Myasthenia gravis without exacerbation ([place]) 03/31/2011    Andrey Spearman, MS, OTR/L, CLT-LANA 01/14/21 12:54 PM  [facility] [address]  [place], [place], [address] Phone: [phone]   Fax:  [phone]  Name: KHA HARI MRN: [id number] Date of Birth: [birth year]

## 2021-01-16 ENCOUNTER — Other Ambulatory Visit: Payer: Self-pay

## 2021-01-16 ENCOUNTER — Ambulatory Visit: Payer: Medicare Other | Admitting: Occupational Therapy

## 2021-01-16 DIAGNOSIS — I890 Lymphedema, not elsewhere classified: Secondary | ICD-10-CM

## 2021-01-16 NOTE — Therapy (Signed)
[place] MAIN [facility] SERVICES [address] [place], [place], [address] Phone: [phone]   Fax:  [phone]  Occupational Therapy Treatment  Patient Details  Name: [name] MRN: [id number] Date of Birth: 1926/09/20 Referring Provider (OT): [name], MD   Encounter Date: 01/16/2021   OT End of Session - 01/16/21 1054    Visit Number 6    Number of Visits 36    Date for OT Re-Evaluation 03/31/21    OT Start Time 1049    OT Stop Time 1200    OT Time Calculation (min) 71 min    Activity Tolerance Patient tolerated treatment well;No increased pain    Behavior During Therapy WFL for tasks assessed/performed           Past Medical History:  Diagnosis Date  . Abnormal chest CT 03/28/2017  . Allergy   . Anemia   . Angina pectoris ([place]) 04/02/2011  . Aortic aneurysm ([place]) 03/28/2017   Saw Dr Genevive Bi 04/16/17 - recommended f/u CT in 3 months.    . Atherosclerosis of both carotid arteries 07/17/2014  . Benign lipomatous neoplasm of skin and subcutaneous tissue of left arm 10/04/2013  . Bilateral carotid artery stenosis 08/30/2014   Overview:  Less than 50% 2015  . Bladder cancer ([place]) 03/05/2013  . CAD (coronary artery disease) 06/18/2014  . Cancer ([place]) 11-24-12   bladder. BCG treatments by Dr Jacqlyn Larsen.  . Chicken pox   . Chronic prostatitis 09/30/2012  . Colon polyp   . Congestive heart failure ([place]) 04/20/2014   Overview:  Overview:  With anterior mi and moderate lv dyfunction ef 35%  . Dizziness 12/22/2016  . 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 Elevated prostate specific antigen (PSA) 09/30/2012  . Essential hypertension, benign   . Gross hematuria 09/30/2012  . Herpes zoster 11/28/2011   Overview:  with ocular involvement OD  . Hypercholesterolemia   . Hypertension, benign 04/02/2011  . Hypotension   . Moderate mitral insufficiency 05/18/2015  . Moderate tricuspid insufficiency 05/18/2015  .  Myasthenia gravis ([place])   . Myasthenia gravis without exacerbation ([place]) 03/31/2011  . Shingles 2013  . Squamous cell carcinoma of skin 11/22/2015   Left cheek. WD SCC. Re-shaved 01/14/2016. Excised 02/05/2016, margins free.  Marland Kitchen Squamous cell carcinoma of skin 11/03/2018   Right cheek ant. to sideburn. Poorly differentiated.  . Squamous cell carcinoma of skin 02/02/2019   Right mid dorsum forearm. WD SCC. EDC.    Past Surgical History:  Procedure Laterality Date  . APPENDECTOMY  1958  . CHOLECYSTECTOMY  1979  . HERNIA REPAIR  1970  . TONSILLECTOMY  1958    There were no vitals filed for this visit.   Subjective Assessment - 01/16/21 1055    Subjective  [name] presents for OT Rx visit 7/36 to address BLE lymphedema, L>R. Pt denies leg pain this morning. Pt has no new complaints.    Patient is accompanied by: Family member   daughter, Rhian Funari   Pertinent History chronic BLE lymphedema (LE) and associated pain/ discomfort. Altered sensation/numbness LLE, Hx lymphorrhea, hx non-pressure related leg ulcer, stasis dermatitis, AORTIC ANEURISM, BILATERAL CAROTID STENOSIS, BLADDER CANCER 03/05/13, CAD, CHF. htn, s/p squamous cell carcinoma 2020 x 2, 20117    Limitations decreased endurance, decreased balance, chronic leg swelling w paihn and hx skin ulcer    Repetition Increases Symptoms    Special Tests +Stemmer bilaterally, FOTO TBA Rx visit  1    Patient Stated Goals "get this swelling down in about 2 weeks"    Pain Onset Other (comment)   insideous onset ~ 5 yrs ago                       OT Treatments/Exercises (OP) - 01/16/21 1215      ADLs   ADL Education Given Yes      Manual Therapy   Manual Therapy Edema management;Compression Bandaging;Manual Lymphatic Drainage (MLD)    Edema Management skin care to RLE throughout MLD to increase hydration, skin mobility and tissue health    Manual Lymphatic Drainage (MLD) MLD to RLE/RLQ utilizing shoret neck sequence (  CLAVICULAR LN ONLY in keeping w CAROTID precautions), deep abdominal pathway, functional inguinal LN and J strokes from proximal to disial to thigh, leg and dorsal foot. Good tolerance. No SOB at rest.    Compression Bandaging LLE kee length CircAid trial with anklet for weekend. No wraps to RLE.                  OT Education - 01/16/21 1212    Education Details Continued skilled Pt/caregiver education  And LE ADL training throughout visit for lymphedema self care/ home program, including compression wrapping, compression garment and device wear/care, lymphatic pumping ther ex, simple self-MLD, and skin care. Discussed progress towards goals.    Person(s) Educated Patient    Methods Explanation;Demonstration;Handout    Comprehension Verbalized understanding;Returned demonstration;Need further instruction               OT Long Term Goals - 01/01/21 1333      OT LONG TERM GOAL #1   Title Given this patient's risk-adjustment variables, and is Intake Functional Status score of /100 on the FOTO tool, patient will experience at least an increase in function of 5 points, or higher.    Baseline TBA    Time 12    Period Weeks    Status New    Target Date 03/31/21      OT LONG TERM GOAL #2   Title Pt will be able to apply knee length, multi-layer, short stretch compression wraps to one leg using correct gradient techniques with extra time (modified independence)  decrease limb volume, to decrease leg pain and limit infection risk, and to improve safe functional ambulation and mobility.    Baseline max a    Time 4    Period Days    Status New    Target Date --   4th OT Rx visit     OT LONG TERM GOAL #3   Title Patient will demonstrate understanding of LE precautions, prevention strategies and cellulitis signs and symptoms by verbalizing at least 4 examples using  printed reference( modified independence)  to limit infection risk, recurrent wounds and LE progression.    Baseline Max  A    Time 4    Period Days    Status New    Target Date --   4th OT Rx visit     OT LONG TERM GOAL #4   Title Pt will achieve 5%, or greater,  limb volume reduction on the R below the knee, and 10% reduction on the L below the knee (ankle to tibial tuberosity = A-D) during Intensive Phase CDT to achieve optimal limb volume reduction and to prevent re-accumulation of lymphatic congestion and fibrosis in the legs, to limit infection risk, to improve functional ambulation and transfers, to improve functional  performance of basic and instrumental ADLs, and to limit LE progression.    Baseline Max A    Time 12    Period Weeks    Status New    Target Date 03/31/21      OT LONG TERM GOAL #5   Title Pt will demonstrate and sustain a least 85% compliance performing all daily LE self-care home program components daily throughout Intensive Phase CDT, including recommended skin care regime, lymphatic pumping ther ex, 23/7 compression wraps and simple self-MLD, to ensure optimal limb volume reduction, to limit infection risk and to limit LE progression.    Baseline Max A    Time 12    Period Weeks    Status New    Target Date 03/31/21      Long Term Additional Goals   Additional Long Term Goals Yes      OT LONG TERM GOAL #6   Title Using assistive devices and additional time  (modified independence)  Pt will be able to don and doff appropriate daytime compression garments and HOS devices to limit lymphatic re-accumulation and LE progression with before transitioning to self-management phase of CDT.    Baseline Max A    Time 12    Period Weeks   issue date   Status New    Target Date 03/31/21                 Plan - 01/16/21 1213    Clinical Impression Statement Was able to find CircAid compression garment alternative for Pt to trial over the weekend. He's taking a trip with his son and won't have anyone to appl;y wraps. The CircAid fits well and he is able to don and doff independently w  extra time. Commenced MLD to RLE today as established. No RLE comrpession in lieu of garment trial on the L. Cont as per POC.    OT Occupational Profile and History Comprehensive Assessment- Review of records and extensive additional review of physical, cognitive, psychosocial history related to current functional performance    Occupational performance deficits (Please refer to evaluation for details): ADL's;IADL's;Work;Leisure;Social Participation    Body Structure / Function / Physical Skills ADL;Decreased knowledge of use of DME;Balance;Pain;Edema;Endurance;IADL;Scar mobility;Mobility;Decreased knowledge of precautions;Skin integrity    Rehab Potential Good    Clinical Decision Making Several treatment options, min-mod task modification necessary    Comorbidities Affecting Occupational Performance: Presence of comorbidities impacting occupational performance    Comorbidities impacting occupational performance description: CVI, CAD, hx non-pressure related leg ulcer    Modification or Assistance to Complete Evaluation  Min-Moderate modification of tasks or assist with assess necessary to complete eval    OT Frequency 2x / week    OT Duration 12 weeks   follow aong and PRN for garments   OT Treatment/Interventions Self-care/ADL training;DME and/or AE instruction;Manual lymph drainage;Compression bandaging;Therapeutic activities;Therapeutic exercise;Scar mobilization;Other (comment);Manual Therapy;Patient/family education   skin care with low ph lotion and/ or castor oil to imrove tissue hydration and mobility essential for lymphatic drainage   Plan Complete Decongestive Therapy (CDT) to sigle limb a a time to limit fall risk and impact on function. Pt and CG will be taught to apply multilayer gradient compression wraps below knee to base of toes. CDT also to include manual lymphatic drainage, skin care, therapeutic exercise and fitting with appropriate knee length compression garments that are  comfortable and Pt is able to don and doff using assistive devices PRN.    Recommended Other Services Consider fitting Pt  with advanced sequential pneumatic compression device PRN.    Consulted and Agree with Plan of Care Patient;Family member/caregiver    Family Member Consulted daughter, [name]           Patient will benefit from skilled therapeutic intervention in order to improve the following deficits and impairments:   Body Structure / Function / Physical Skills: ADL,Decreased knowledge of use of DME,Balance,Pain,Edema,Endurance,IADL,Scar mobility,Mobility,Decreased knowledge of precautions,Skin integrity       Visit Diagnosis: Lymphedema, not elsewhere classified    Problem List Patient Active Problem List   Diagnosis Date Noted  . Rectal irritation 11/25/2020  . Lymphedema 11/19/2020  . Pressure injury of right buttock, stage 2 ([place]) 04/23/2020  . Acute exacerbation of CHF (congestive heart failure) ([place]) 01/13/2020  . Lower limb ulcer, calf, left, limited to breakdown of skin ([place]) 12/27/2019  . Dysuria 06/30/2019  . Hyperglycemia 06/30/2019  . LVH (left ventricular hypertrophy) due to hypertensive disease, without heart failure 05/26/2019  . Pseudophakia, left eye 11/26/2018  . Venous insufficiency of both lower extremities 10/13/2018  . Swelling of limb 10/12/2018  . Bilateral lower extremity edema 10/10/2018  . Absent pulse in lower extremity 10/10/2018  . 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 Eye pain 05/31/2018  . Mild aortic valve stenosis 12/03/2017  . Epididymoorchitis 09/02/2017  . Acute cystitis with hematuria 08/11/2017  . Hydrocele, right 08/11/2017  . Aortic aneurysm ([place]) 03/28/2017  . Abnormal chest CT 03/28/2017  . Hypotension   . Dizziness 12/22/2016  . Leg weakness 03/11/2016  . Skin lesion 09/11/2015  . Moderate mitral insufficiency 05/18/2015  . Moderate tricuspid insufficiency 05/18/2015  . Benign essential hypertension 04/25/2015   . Rash 03/10/2015  . Health care maintenance 03/10/2015  . Bilateral carotid artery stenosis 08/30/2014  . Atherosclerosis of both carotid arteries 07/17/2014  . Chronic systolic CHF (congestive heart failure), NYHA class 3 ([place]) 07/17/2014  . CAD (coronary artery disease) 06/18/2014  . CHF (congestive heart failure) ([place]) 04/20/2014  . Congestive heart failure ([place]) 04/20/2014  . SOB (shortness of breath) 03/10/2014  . Abnormal EKG 03/10/2014  . Mixed hyperlipidemia 03/10/2014  . Benign lipomatous neoplasm of skin and subcutaneous tissue of left arm 10/04/2013  . Arm skin lesion, left 09/04/2013  . History of shingles 09/04/2013  . History of bladder cancer 03/05/2013  . Chronic prostatitis 09/30/2012  . Elevated prostate specific antigen (PSA) 09/30/2012  . Family history of malignant neoplasm of prostate 09/30/2012  . Gross hematuria 09/30/2012  . Incomplete emptying of bladder 09/30/2012  . Anemia 08/30/2012  . 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 Hypercholesterolemia 08/30/2012  . Herpes zoster 11/28/2011  . Hypertension, benign 04/02/2011  . Myasthenia gravis without exacerbation ([place]) 03/31/2011    Andrey Spearman, MS, OTR/L, [facility] 01/16/21 12:18 PM  [place] MAIN [facility] SERVICES [address] [place], [place], [address] Phone: [phone]   Fax:  [phone]  Name: [name] MRN: [id number] Date of Birth: 20-May-1926

## 2021-01-18 ENCOUNTER — Ambulatory Visit: Payer: Medicare Other | Admitting: Occupational Therapy

## 2021-01-21 ENCOUNTER — Ambulatory Visit: Payer: Medicare Other | Admitting: Dermatology

## 2021-01-21 ENCOUNTER — Ambulatory Visit: Payer: Medicare Other | Admitting: Occupational Therapy

## 2021-01-21 ENCOUNTER — Other Ambulatory Visit: Payer: Self-pay

## 2021-01-21 DIAGNOSIS — I872 Venous insufficiency (chronic) (peripheral): Secondary | ICD-10-CM

## 2021-01-21 DIAGNOSIS — L578 Other skin changes due to chronic exposure to nonionizing radiation: Secondary | ICD-10-CM | POA: Diagnosis not present

## 2021-01-21 DIAGNOSIS — L570 Actinic keratosis: Secondary | ICD-10-CM | POA: Diagnosis not present

## 2021-01-21 DIAGNOSIS — L820 Inflamed seborrheic keratosis: Secondary | ICD-10-CM

## 2021-01-21 NOTE — Progress Notes (Signed)
Follow-Up Visit   Subjective  [name] is a 87 y.o. male who presents for the following: Follow-up (Patient here today for follow up on stasis dermatitis of both legs. Patient states he went to lymphedema clinic 6 times already and has been going twice weekly. Patient states he feels his legs have improved since going to lymphedema clinic. ).  The following portions of the chart were reviewed this encounter and updated as appropriate:  Tobacco  Allergies  Meds  Problems  Med Hx  Surg Hx  Fam Hx      Objective  Well appearing patient in no apparent distress; mood and affect are within normal limits.  A focused examination was performed including face, scalp, bilateral lower legs . Relevant physical exam findings are noted in the Assessment and Plan.  Objective  b/l lower legs: Edema of lower legs with minimal crust and erythema   Objective  Scalp x 4 (4): Erythematous thin papules/macules with gritty scale.   Objective  left scalp x 1: Erythematous keratotic or waxy stuck-on papule or plaque.   Assessment & Plan  Stasis dermatitis of both legs b/l lower legs With Venous stasis dermatitis of left lower extremity Stasis dermatitis of both legs with lymphedema worse on the left leg.  Patient finds it difficult to wear compression stockings. Patient legs have much improvement since receiving treatment at lymphedema clinic. Recommend continue treatment at lymphedema clinic and will follow up with patient in 6 months.   Actinic keratosis (4) Scalp x 4 Prior to procedure, discussed risks of blister formation, small wound, skin dyspigmentation, or rare scar following cryotherapy.   Destruction of lesion - Scalp x 4 Complexity: simple   Destruction method: cryotherapy   Informed consent: discussed and consent obtained   Timeout:  patient name, date of birth, surgical site, and procedure verified Lesion destroyed using liquid nitrogen: Yes   Region frozen until ice  ball extended beyond lesion: Yes   Outcome: patient tolerated procedure well with no complications   Post-procedure details: wound care instructions given    Inflamed seborrheic keratosis left scalp x 1 Prior to procedure, discussed risks of blister formation, small wound, skin dyspigmentation, or rare scar following cryotherapy.   Destruction of lesion - left scalp x 1 Complexity: simple   Destruction method: cryotherapy   Informed consent: discussed and consent obtained   Timeout:  patient name, date of birth, surgical site, and procedure verified Lesion destroyed using liquid nitrogen: Yes   Region frozen until ice ball extended beyond lesion: Yes   Outcome: patient tolerated procedure well with no complications   Post-procedure details: wound care instructions given    Seborrheic Keratoses - Stuck-on, waxy, tan-brown papules and/or plaques  - Benign-appearing - Discussed benign etiology and prognosis. - Observe - Call for any changes  Actinic Damage - chronic, secondary to cumulative UV radiation exposure/sun exposure over time - diffuse scaly erythematous macules with underlying dyspigmentation - Recommend daily broad spectrum sunscreen SPF 30+ to sun-exposed areas, reapply every 2 hours as needed.  - Recommend staying in the shade or wearing long sleeves, sun glasses (UVA+UVB protection) and wide brim hats (4-inch brim around the entire circumference of the hat). - Call for new or changing lesions.  Return in about 6 months (around 07/23/2021) for stasis derm follow up .  Garry Heater, CMA, am acting as scribe for [name], MD.  Documentation: I have reviewed the above documentation for accuracy and completeness, and I agree with the  above.  [name], MD

## 2021-01-21 NOTE — Patient Instructions (Signed)

## 2021-01-22 ENCOUNTER — Encounter: Payer: Self-pay | Admitting: Dermatology

## 2021-01-23 ENCOUNTER — Ambulatory Visit: Payer: Medicare Other | Admitting: Occupational Therapy

## 2021-01-24 ENCOUNTER — Ambulatory Visit: Payer: Medicare Other | Admitting: Occupational Therapy

## 2021-01-25 ENCOUNTER — Ambulatory Visit: Payer: Medicare Other | Admitting: Occupational Therapy

## 2021-01-28 ENCOUNTER — Other Ambulatory Visit: Payer: Self-pay

## 2021-01-28 ENCOUNTER — Ambulatory Visit: Payer: Medicare Other | Attending: Dermatology | Admitting: Occupational Therapy

## 2021-01-28 DIAGNOSIS — I890 Lymphedema, not elsewhere classified: Secondary | ICD-10-CM | POA: Diagnosis not present

## 2021-01-28 NOTE — Patient Instructions (Signed)
Lymphedema Self- Care Instructions  1. EXERCISE: Perform lymphatic pumping there ex 2 x a day. While wearing your compression wraps or garments. Perform 10 reps of each exercise bilaterally and be sure to perform them in order. Don;t skip around!  OMIT PARTIAL SIT UP  2. MLD: Perform simple self-Manual Lymphatic Drainage (MLD) at least once a day as directed.  3. WRAPS: Compression wraps are to be worn 23 hrs/ 7 days/wk during Intensive Phase of Complete Decongestive Therapy (CDT).Building tolerance may take time and practice, so don't get discouraged. If bandages begin to feel tight during periods of inactivity and/or during the night, try performing your exercises to loosen them.   4. GARMENTS: During Management Phase CDT your compression garments are to be worn during waking hours when active. Do NOT sleep in your garments!!   5. PUT YOUR FEET UP! Elevate your feet and legs and feet to the level of your heart whenever you are sitting down.   6. SKIN: Carefully monitor skin condition and perform impeccable hygiene daily. Bathe skin with mild soap and water and apply low pH lotion (aka Eucerin ) to improve hydration and limit infection risk.

## 2021-01-28 NOTE — Therapy (Signed)
[place] MAIN [facility] SERVICES [address] [place], [place], [address] Phone: [phone]   Fax:  [phone]  Occupational Therapy Treatment  Patient Details  Name: [name] MRN: [id number] Date of Birth: 1925-12-23 Referring Provider (OT): [name], MD   Encounter Date: 01/28/2021   OT End of Session - 01/28/21 1015    Visit Number 7    Number of Visits 36    Date for OT Re-Evaluation 03/31/21    OT Start Time 1008    OT Stop Time 1115    OT Time Calculation (min) 67 min    Activity Tolerance Patient tolerated treatment well;No increased pain    Behavior During Therapy WFL for tasks assessed/performed           Past Medical History:  Diagnosis Date  . Abnormal chest CT 03/28/2017  . Allergy   . Anemia   . Angina pectoris ([place]) 04/02/2011  . Aortic aneurysm ([place]) 03/28/2017   Saw Dr Genevive Bi 04/16/17 - recommended f/u CT in 3 months.    . Atherosclerosis of both carotid arteries 07/17/2014  . Benign lipomatous neoplasm of skin and subcutaneous tissue of left arm 10/04/2013  . Bilateral carotid artery stenosis 08/30/2014   Overview:  Less than 50% 2015  . Bladder cancer ([place]) 03/05/2013  . CAD (coronary artery disease) 06/18/2014  . Cancer ([place]) 11-24-12   bladder. BCG treatments by Dr Jacqlyn Larsen.  . Chicken pox   . Chronic prostatitis 09/30/2012  . Colon polyp   . Congestive heart failure ([place]) 04/20/2014   Overview:  Overview:  With anterior mi and moderate lv dyfunction ef 35%  . Dizziness 12/22/2016  . 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 Eaton-Lambert syndrome ([place])   . Elevated prostate specific antigen (PSA) 09/30/2012  . Essential hypertension, benign   . Gross hematuria 09/30/2012  . Herpes zoster 11/28/2011   Overview:  with ocular involvement OD  . Hypercholesterolemia   . Hypertension, benign 04/02/2011  . Hypotension   . Moderate mitral insufficiency 05/18/2015  . Moderate tricuspid insufficiency 05/18/2015  .  Myasthenia gravis ([place])   . Myasthenia gravis without exacerbation ([place]) 03/31/2011  . Shingles 2013  . Squamous cell carcinoma of skin 11/22/2015   Left cheek. WD SCC. Re-shaved 01/14/2016. Excised 02/05/2016, margins free.  Marland Kitchen Squamous cell carcinoma of skin 11/03/2018   Right cheek ant. to sideburn. Poorly differentiated.  . Squamous cell carcinoma of skin 02/02/2019   Right mid dorsum forearm. WD SCC. EDC.    Past Surgical History:  Procedure Laterality Date  . APPENDECTOMY  1958  . CHOLECYSTECTOMY  1979  . HERNIA REPAIR  1970  . TONSILLECTOMY  1958    There were no vitals filed for this visit.   Subjective Assessment - 01/28/21 1144    Subjective  [name] presents for OT Rx visit 8/36 to address BLE lymphedema, L>R. Pt was last seen on 4/20. He reports positive appointment with dermatologist on 5/25. Pt reports he used loaned , adjustable , Velcro style circaid  some during his fishing trip, but has not used compression for 4-5 days. Pt denies LE related leg pain this morning.    Patient is accompanied by: Family member   daughter, Tedford Berg   Pertinent History chronic BLE lymphedema (LE) and associated pain/ discomfort. Altered sensation/numbness LLE, Hx lymphorrhea, hx non-pressure related leg ulcer, stasis dermatitis, AORTIC ANEURISM, BILATERAL CAROTID STENOSIS, BLADDER CANCER 03/05/13, CAD, CHF. htn, s/p squamous cell carcinoma 2020 x  2, 20117    Limitations decreased endurance, decreased balance, chronic leg swelling w paihn and hx skin ulcer    Repetition Increases Symptoms    Special Tests +Stemmer bilaterally, FOTO TBA Rx visit 1    Patient Stated Goals "get this swelling down in about 2 weeks"    Pain Onset Other (comment)   insideous onset ~ 5 yrs ago                       OT Treatments/Exercises (OP) - 01/28/21 1148      ADLs   ADL Education Given Yes      Manual Therapy   Manual Therapy Edema management;Compression Bandaging;Manual Lymphatic  Drainage (MLD)    Edema Management skin care to LLE tbelow the knee throughout MLD to increase hydration, skin mobility and tissue health    Manual Lymphatic Drainage (MLD) MLD to LLE/LLQ utilizing short neck sequence ( CLAVICULAR LN ONLY in keeping w CAROTID precautions), deep abdominal pathway, functional inguinal LN and J strokes from proximal to disial to thigh, leg and dorsal foot. Good tolerance.    Compression Bandaging Resumed multilayer gradient compression wraps from foot to tibial tuberosity of L leg. Added a half roll of of Rosidal foam from largest calf to popliteal fossa  and added  a third  12 cm wide short stretch wrap from wisedcalf to popliteal fossa in effort to increase compression proximally towards LN behind the knee and to better facilitate flow through the "bottleneck" region at the medial knee. Pt denied c/o discomfort with extra wraps added.                  OT Education - 01/28/21 1159    Education Details Continued skilled Pt/caregiver education  And LE ADL training throughout visit for lymphedema self care/ home program, including compression wrapping, compression garment and device wear/care, lymphatic pumping ther ex, simple self-MLD, and skin care. Discussed progress towards goals.    Person(s) Educated Patient    Methods Explanation;Demonstration;Handout    Comprehension Verbalized understanding;Returned demonstration;Need further instruction               OT Long Term Goals - 01/01/21 1333      OT LONG TERM GOAL #1   Title Given this patient's risk-adjustment variables, and is Intake Functional Status score of /100 on the FOTO tool, patient will experience at least an increase in function of 5 points, or higher.    Baseline TBA    Time 12    Period Weeks    Status New    Target Date 03/31/21      OT LONG TERM GOAL #2   Title Pt will be able to apply knee length, multi-layer, short stretch compression wraps to one leg using correct gradient  techniques with extra time (modified independence)  decrease limb volume, to decrease leg pain and limit infection risk, and to improve safe functional ambulation and mobility.    Baseline max a    Time 4    Period Days    Status New    Target Date --   4th OT Rx visit     OT LONG TERM GOAL #3   Title Patient will demonstrate understanding of LE precautions, prevention strategies and cellulitis signs and symptoms by verbalizing at least 4 examples using  printed reference( modified independence)  to limit infection risk, recurrent wounds and LE progression.    Baseline Max A    Time 4  Period Days    Status New    Target Date --   4th OT Rx visit     OT LONG TERM GOAL #4   Title Pt will achieve 5%, or greater,  limb volume reduction on the R below the knee, and 10% reduction on the L below the knee (ankle to tibial tuberosity = A-D) during Intensive Phase CDT to achieve optimal limb volume reduction and to prevent re-accumulation of lymphatic congestion and fibrosis in the legs, to limit infection risk, to improve functional ambulation and transfers, to improve functional performance of basic and instrumental ADLs, and to limit LE progression.    Baseline Max A    Time 12    Period Weeks    Status New    Target Date 03/31/21      OT LONG TERM GOAL #5   Title Pt will demonstrate and sustain a least 85% compliance performing all daily LE self-care home program components daily throughout Intensive Phase CDT, including recommended skin care regime, lymphatic pumping ther ex, 23/7 compression wraps and simple self-MLD, to ensure optimal limb volume reduction, to limit infection risk and to limit LE progression.    Baseline Max A    Time 12    Period Weeks    Status New    Target Date 03/31/21      Long Term Additional Goals   Additional Long Term Goals Yes      OT LONG TERM GOAL #6   Title Using assistive devices and additional time  (modified independence)  Pt will be able to don  and doff appropriate daytime compression garments and HOS devices to limit lymphatic re-accumulation and LE progression with before transitioning to self-management phase of CDT.    Baseline Max A    Time 12    Period Weeks   issue date   Status New    Target Date 03/31/21                 Plan - 01/28/21 1200    Clinical Impression Statement Pt tooka short break from Intensive Phase CDT to attend a spontaneous family fishing trip [place]yt the [place]. He tells me he was able to use the loaned Velcro, adustable alternative compression wrap for a day, but was concerned about the  deep indentations it left in the skin on his L leg. He has not learned to wrap himself, so he did not reapply compression wraps for several days. Limb swelling is significantly increased since last visit on 4/290, but , by visual assessment, does not appear to have regained initial limb volume. Skin condition has retained clinical gains made to date. Resumed LLE skin care contiguous with LLE MLD. Volume reduction and decrease in pitting tissue protien is reduced moderately at end of manual therapy , especially at volar L foot and distal leg. Pt reports symptom relief for tightness and fullness in the lower limb. Cont as per POC. Pt will bribng neighbor to clinic next visit to learn wrapping technique as was our orig9inaol plan.    OT Occupational Profile and History Comprehensive Assessment- Review of records and extensive additional review of physical, cognitive, psychosocial history related to current functional performance    Occupational performance deficits (Please refer to evaluation for details): ADL's;IADL's;Work;Leisure;Social Participation    Body Structure / Function / Physical Skills ADL;Decreased knowledge of use of DME;Balance;Pain;Edema;Endurance;IADL;Scar mobility;Mobility;Decreased knowledge of precautions;Skin integrity    Rehab Potential Good    Clinical Decision Making Several treatment options, min-mod  task  modification necessary    Comorbidities Affecting Occupational Performance: Presence of comorbidities impacting occupational performance    Comorbidities impacting occupational performance description: CVI, CAD, hx non-pressure related leg ulcer    Modification or Assistance to Complete Evaluation  Min-Moderate modification of tasks or assist with assess necessary to complete eval    OT Frequency 2x / week    OT Duration 12 weeks   follow aong and PRN for garments   OT Treatment/Interventions Self-care/ADL training;DME and/or AE instruction;Manual lymph drainage;Compression bandaging;Therapeutic activities;Therapeutic exercise;Scar mobilization;Other (comment);Manual Therapy;Patient/family education   skin care with low ph lotion and/ or castor oil to imrove tissue hydration and mobility essential for lymphatic drainage   Plan Complete Decongestive Therapy (CDT) to sigle limb a a time to limit fall risk and impact on function. Pt and CG will be taught to apply multilayer gradient compression wraps below knee to base of toes. CDT also to include manual lymphatic drainage, skin care, therapeutic exercise and fitting with appropriate knee length compression garments that are comfortable and Pt is able to don and doff using assistive devices PRN.    Recommended Other Services Consider fitting Pt with advanced sequential pneumatic compression device PRN.    Consulted and Agree with Plan of Care Patient;Family member/caregiver    Family Member Consulted daughter, [name]           Patient will benefit from skilled therapeutic intervention in order to improve the following deficits and impairments:   Body Structure / Function / Physical Skills: ADL,Decreased knowledge of use of DME,Balance,Pain,Edema,Endurance,IADL,Scar mobility,Mobility,Decreased knowledge of precautions,Skin integrity       Visit Diagnosis: Lymphedema, not elsewhere classified    Problem List Patient Active Problem List    Diagnosis Date Noted  . Rectal irritation 11/25/2020  . Lymphedema 11/19/2020  . Pressure injury of right buttock, stage 2 ([place]) 04/23/2020  . Acute exacerbation of CHF (congestive heart failure) ([place]) 01/13/2020  . Lower limb ulcer, calf, left, limited to breakdown of skin ([place]) 12/27/2019  . Dysuria 06/30/2019  . Hyperglycemia 06/30/2019  . LVH (left ventricular hypertrophy) due to hypertensive disease, without heart failure 05/26/2019  . Pseudophakia, left eye 11/26/2018  . Venous insufficiency of both lower extremities 10/13/2018  . Swelling of limb 10/12/2018  . Bilateral lower extremity edema 10/10/2018  . Absent pulse in lower extremity 10/10/2018  . 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 Eye pain 05/31/2018  . Mild aortic valve stenosis 12/03/2017  . Epididymoorchitis 09/02/2017  . Acute cystitis with hematuria 08/11/2017  . Hydrocele, right 08/11/2017  . Aortic aneurysm ([place]) 03/28/2017  . Abnormal chest CT 03/28/2017  . Hypotension   . Dizziness 12/22/2016  . Leg weakness 03/11/2016  . Skin lesion 09/11/2015  . Moderate mitral insufficiency 05/18/2015  . Moderate tricuspid insufficiency 05/18/2015  . Benign essential hypertension 04/25/2015  . Rash 03/10/2015  . Health care maintenance 03/10/2015  . Bilateral carotid artery stenosis 08/30/2014  . Atherosclerosis of both carotid arteries 07/17/2014  . Chronic systolic CHF (congestive heart failure), NYHA class 3 ([place]) 07/17/2014  . CAD (coronary artery disease) 06/18/2014  . CHF (congestive heart failure) ([place]) 04/20/2014  . Congestive heart failure ([place]) 04/20/2014  . SOB (shortness of breath) 03/10/2014  . Abnormal EKG 03/10/2014  . Mixed hyperlipidemia 03/10/2014  . Benign lipomatous neoplasm of skin and subcutaneous tissue of left arm 10/04/2013  . Arm skin lesion, left 09/04/2013  . History of shingles 09/04/2013  . History of bladder cancer 03/05/2013  . Chronic  prostatitis 09/30/2012  . Elevated  prostate specific antigen (PSA) 09/30/2012  . Family history of malignant neoplasm of prostate 09/30/2012  . Gross hematuria 09/30/2012  . Incomplete emptying of bladder 09/30/2012  . Anemia 08/30/2012  . 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 Hypercholesterolemia 08/30/2012  . Herpes zoster 11/28/2011  . Hypertension, benign 04/02/2011  . Myasthenia gravis without exacerbation ([place]) 03/31/2011    Ansel Bong 01/28/2021, 12:06 PM  [place] MAIN [facility] SERVICES [address] [place], [place], [address] Phone: [phone]   Fax:  [phone]  Name: [name] MRN: [id number] Date of Birth: [birth year]

## 2021-01-28 NOTE — Therapy (Signed)
[place] MAIN [facility] SERVICES [address] [place], [place], [address] Phone: [phone]   Fax:  [phone]  Occupational Therapy Treatment  Patient Details  Name: [name] MRN: [id number] Date of Birth: [birth year] Referring Provider (OT): [name], MD   Encounter Date: 01/28/2021   OT End of Session - 01/28/21 1015    Visit Number 7    Number of Visits 36    Date for OT Re-Evaluation 03/31/21    OT Start Time 1008    OT Stop Time 1115    OT Time Calculation (min) 67 min    Activity Tolerance Patient tolerated treatment well;No increased pain    Behavior During Therapy WFL for tasks assessed/performed           Past Medical History:  Diagnosis Date  . Abnormal chest CT 03/28/2017  . Allergy   . Anemia   . Angina pectoris ([place]) 04/02/2011  . Aortic aneurysm ([place]) 03/28/2017   Saw Dr Genevive Bi 04/16/17 - recommended f/u CT in 3 months.    . Atherosclerosis of both carotid arteries 07/17/2014  . Benign lipomatous neoplasm of skin and subcutaneous tissue of left arm 10/04/2013  . Bilateral carotid artery stenosis 08/30/2014   Overview:  Less than 50% 2015  . Bladder cancer ([place]) 03/05/2013  . CAD (coronary artery disease) 06/18/2014  . Cancer ([place]) 11-24-12   bladder. BCG treatments by Dr Jacqlyn Larsen.  . Chicken pox   . Chronic prostatitis 09/30/2012  . Colon polyp   . Congestive heart failure ([place]) 04/20/2014   Overview:  Overview:  With anterior mi and moderate lv dyfunction ef 35%  . Dizziness 12/22/2016  . 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 Eaton-Lambert syndrome ([place])   . Elevated prostate specific antigen (PSA) 09/30/2012  . Essential hypertension, benign   . Gross hematuria 09/30/2012  . Herpes zoster 11/28/2011   Overview:  with ocular involvement OD  . Hypercholesterolemia   . Hypertension, benign 04/02/2011  . Hypotension   . Moderate mitral insufficiency 05/18/2015  . Moderate tricuspid insufficiency 05/18/2015  .  Myasthenia gravis ([place])   . Myasthenia gravis without exacerbation ([place]) 03/31/2011  . Shingles 2013  . Squamous cell carcinoma of skin 11/22/2015   Left cheek. WD SCC. Re-shaved 01/14/2016. Excised 02/05/2016, margins free.  Marland Kitchen Squamous cell carcinoma of skin 11/03/2018   Right cheek ant. to sideburn. Poorly differentiated.  . Squamous cell carcinoma of skin 02/02/2019   Right mid dorsum forearm. WD SCC. EDC.    Past Surgical History:  Procedure Laterality Date  . APPENDECTOMY  1958  . CHOLECYSTECTOMY  1979  . HERNIA REPAIR  1970  . TONSILLECTOMY  1958    There were no vitals filed for this visit.   Subjective Assessment - 01/28/21 1144    Subjective  [name] presents for OT Rx visit 8/36 to address BLE lymphedema, L>R. Pt was last seen on 4/20. He reports positive appointment with dermatologist on 5/25. Pt reports he used loaned , adjustable , Velcro style circaid  some during his fishing trip, but has not used compression for 4-5 days. Pt denies LE related leg pain this morning.    Patient is accompanied by: Family member   daughter, [name]   Pertinent History chronic BLE lymphedema (LE) and associated pain/ discomfort. Altered sensation/numbness LLE, Hx lymphorrhea, hx non-pressure related leg ulcer, stasis dermatitis, AORTIC ANEURISM, BILATERAL CAROTID STENOSIS, BLADDER CANCER 03/05/13, CAD, CHF. htn, s/p squamous cell carcinoma 2020 x  2, 20117    Limitations decreased endurance, decreased balance, chronic leg swelling w paihn and hx skin ulcer    Repetition Increases Symptoms    Special Tests +Stemmer bilaterally, FOTO TBA Rx visit 1    Patient Stated Goals "get this swelling down in about 2 weeks"    Pain Onset Other (comment)   insideous onset ~ 5 yrs ago                       OT Treatments/Exercises (OP) - 01/28/21 1148      ADLs   ADL Education Given Yes      Manual Therapy   Manual Therapy Edema management;Compression Bandaging;Manual Lymphatic  Drainage (MLD)    Edema Management skin care to LLE tbelow the knee throughout MLD to increase hydration, skin mobility and tissue health    Manual Lymphatic Drainage (MLD) MLD to LLE/LLQ utilizing short neck sequence ( CLAVICULAR LN ONLY in keeping w CAROTID precautions), deep abdominal pathway, functional inguinal LN and J strokes from proximal to disial to thigh, leg and dorsal foot. Good tolerance.    Compression Bandaging Resumed multilayer gradient compression wraps from foot to tibial tuberosity of L leg. Added a half roll of of Rosidal foam from largest calf to popliteal fossa  and added  a third  12 cm wide short stretch wrap from wisedcalf to popliteal fossa in effort to increase compression proximally towards LN behind the knee and to better facilitate flow through the "bottleneck" region at the medial knee. Pt denied c/o discomfort with extra wraps added.                  OT Education - 01/28/21 1159    Education Details Continued skilled Pt/caregiver education  And LE ADL training throughout visit for lymphedema self care/ home program, including compression wrapping, compression garment and device wear/care, lymphatic pumping ther ex, simple self-MLD, and skin care. Discussed progress towards goals.    Person(s) Educated Patient    Methods Explanation;Demonstration;Handout    Comprehension Verbalized understanding;Returned demonstration;Need further instruction               OT Long Term Goals - 01/01/21 1333      OT LONG TERM GOAL #1   Title Given this patient's risk-adjustment variables, and is Intake Functional Status score of /100 on the FOTO tool, patient will experience at least an increase in function of 5 points, or higher.    Baseline TBA    Time 12    Period Weeks    Status New    Target Date 03/31/21      OT LONG TERM GOAL #2   Title Pt will be able to apply knee length, multi-layer, short stretch compression wraps to one leg using correct gradient  techniques with extra time (modified independence)  decrease limb volume, to decrease leg pain and limit infection risk, and to improve safe functional ambulation and mobility.    Baseline max a    Time 4    Period Days    Status New    Target Date --   4th OT Rx visit     OT LONG TERM GOAL #3   Title Patient will demonstrate understanding of LE precautions, prevention strategies and cellulitis signs and symptoms by verbalizing at least 4 examples using  printed reference( modified independence)  to limit infection risk, recurrent wounds and LE progression.    Baseline Max A    Time 4  Period Days    Status New    Target Date --   4th OT Rx visit     OT LONG TERM GOAL #4   Title Pt will achieve 5%, or greater,  limb volume reduction on the R below the knee, and 10% reduction on the L below the knee (ankle to tibial tuberosity = A-D) during Intensive Phase CDT to achieve optimal limb volume reduction and to prevent re-accumulation of lymphatic congestion and fibrosis in the legs, to limit infection risk, to improve functional ambulation and transfers, to improve functional performance of basic and instrumental ADLs, and to limit LE progression.    Baseline Max A    Time 12    Period Weeks    Status New    Target Date 03/31/21      OT LONG TERM GOAL #5   Title Pt will demonstrate and sustain a least 85% compliance performing all daily LE self-care home program components daily throughout Intensive Phase CDT, including recommended skin care regime, lymphatic pumping ther ex, 23/7 compression wraps and simple self-MLD, to ensure optimal limb volume reduction, to limit infection risk and to limit LE progression.    Baseline Max A    Time 12    Period Weeks    Status New    Target Date 03/31/21      Long Term Additional Goals   Additional Long Term Goals Yes      OT LONG TERM GOAL #6   Title Using assistive devices and additional time  (modified independence)  Pt will be able to don  and doff appropriate daytime compression garments and HOS devices to limit lymphatic re-accumulation and LE progression with before transitioning to self-management phase of CDT.    Baseline Max A    Time 12    Period Weeks   issue date   Status New    Target Date 03/31/21                 Plan - 01/28/21 1200    Clinical Impression Statement Pt tooka short break from Intensive Phase CDT to attend a spontaneous family fishing trip ayt the [place]. He tells me he was able to use the loaned Velcro, adustable alternative compression wrap for a day, but was concerned about the  deep indentations it left in the skin on his L leg. He has not learned to wrap himself, so he did not reapply compression wraps for several days. Limb swelling is significantly increased since last visit on 4/290, but , by visual assessment, does not appear to have regained initial limb volume. Skin condition has retained clinical gains made to date. Resumed LLE skin care contiguous with LLE MLD. Volume reduction and decrease in pitting tissue protien is reduced moderately at end of manual therapy , especially at volar L foot and distal leg. Pt reports symptom relief for tightness and fullness in the lower limb. Cont as per POC. Pt will bribng neighbor to clinic next visit to learn wrapping technique as was our orig9inaol plan.    OT Occupational Profile and History Comprehensive Assessment- Review of records and extensive additional review of physical, cognitive, psychosocial history related to current functional performance    Occupational performance deficits (Please refer to evaluation for details): ADL's;IADL's;Work;Leisure;Social Participation    Body Structure / Function / Physical Skills ADL;Decreased knowledge of use of DME;Balance;Pain;Edema;Endurance;IADL;Scar mobility;Mobility;Decreased knowledge of precautions;Skin integrity    Rehab Potential Good    Clinical Decision Making Several treatment options, min-mod  task  modification necessary    Comorbidities Affecting Occupational Performance: Presence of comorbidities impacting occupational performance    Comorbidities impacting occupational performance description: CVI, CAD, hx non-pressure related leg ulcer    Modification or Assistance to Complete Evaluation  Min-Moderate modification of tasks or assist with assess necessary to complete eval    OT Frequency 2x / week    OT Duration 12 weeks   follow aong and PRN for garments   OT Treatment/Interventions Self-care/ADL training;DME and/or AE instruction;Manual lymph drainage;Compression bandaging;Therapeutic activities;Therapeutic exercise;Scar mobilization;Other (comment);Manual Therapy;Patient/family education   skin care with low ph lotion and/ or castor oil to imrove tissue hydration and mobility essential for lymphatic drainage   Plan Complete Decongestive Therapy (CDT) to sigle limb a a time to limit fall risk and impact on function. Pt and CG will be taught to apply multilayer gradient compression wraps below knee to base of toes. CDT also to include manual lymphatic drainage, skin care, therapeutic exercise and fitting with appropriate knee length compression garments that are comfortable and Pt is able to don and doff using assistive devices PRN.    Recommended Other Services Consider fitting Pt with advanced sequential pneumatic compression device PRN.    Consulted and Agree with Plan of Care Patient;Family member/caregiver    Family Member Consulted daughter, [name]           Patient will benefit from skilled therapeutic intervention in order to improve the following deficits and impairments:   Body Structure / Function / Physical Skills: ADL,Decreased knowledge of use of DME,Balance,Pain,Edema,Endurance,IADL,Scar mobility,Mobility,Decreased knowledge of precautions,Skin integrity       Visit Diagnosis: Lymphedema, not elsewhere classified    Problem List Patient Active Problem List    Diagnosis Date Noted  . Rectal irritation 11/25/2020  . Lymphedema 11/19/2020  . Pressure injury of right buttock, stage 2 ([place]) 04/23/2020  . Acute exacerbation of CHF (congestive heart failure) ([place]) 01/13/2020  . Lower limb ulcer, calf, left, limited to breakdown of skin ([place]) 12/27/2019  . Dysuria 06/30/2019  . Hyperglycemia 06/30/2019  . LVH (left ventricular hypertrophy) due to hypertensive disease, without heart failure 05/26/2019  . Pseudophakia, left eye 11/26/2018  . Venous insufficiency of both lower extremities 10/13/2018  . Swelling of limb 10/12/2018  . Bilateral lower extremity edema 10/10/2018  . Absent pulse in lower extremity 10/10/2018  . 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 Eye pain 05/31/2018  . Mild aortic valve stenosis 12/03/2017  . Epididymoorchitis 09/02/2017  . Acute cystitis with hematuria 08/11/2017  . Hydrocele, right 08/11/2017  . Aortic aneurysm ([place]) 03/28/2017  . Abnormal chest CT 03/28/2017  . Hypotension   . Dizziness 12/22/2016  . Leg weakness 03/11/2016  . Skin lesion 09/11/2015  . Moderate mitral insufficiency 05/18/2015  . Moderate tricuspid insufficiency 05/18/2015  . Benign essential hypertension 04/25/2015  . Rash 03/10/2015  . Health care maintenance 03/10/2015  . Bilateral carotid artery stenosis 08/30/2014  . Atherosclerosis of both carotid arteries 07/17/2014  . Chronic systolic CHF (congestive heart failure), NYHA class 3 ([place]) 07/17/2014  . CAD (coronary artery disease) 06/18/2014  . CHF (congestive heart failure) ([place]) 04/20/2014  . Congestive heart failure ([place]) 04/20/2014  . SOB (shortness of breath) 03/10/2014  . Abnormal EKG 03/10/2014  . Mixed hyperlipidemia 03/10/2014  . Benign lipomatous neoplasm of skin and subcutaneous tissue of left arm 10/04/2013  . Arm skin lesion, left 09/04/2013  . History of shingles 09/04/2013  . History of bladder cancer 03/05/2013  . Chronic  prostatitis 09/30/2012  . Elevated  prostate specific antigen (PSA) 09/30/2012  . Family history of malignant neoplasm of prostate 09/30/2012  . Gross hematuria 09/30/2012  . Incomplete emptying of bladder 09/30/2012  . Anemia 08/30/2012  . 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 Hypercholesterolemia 08/30/2012  . Herpes zoster 11/28/2011  . Hypertension, benign 04/02/2011  . Myasthenia gravis without exacerbation ([employer]) 03/31/2011    Andrey Spearman, MS, OTR/L, [facility] 01/28/21 12:07 PM   [place] MAIN [facility] SERVICES [address] [place], [place], [address] Phone: [phone]   Fax:  [phone]  Name: [name] MRN: [id number] Date of Birth: March 26, 1926

## 2021-01-30 ENCOUNTER — Ambulatory Visit: Payer: Medicare Other | Admitting: Occupational Therapy

## 2021-01-30 ENCOUNTER — Other Ambulatory Visit: Payer: Self-pay

## 2021-01-30 DIAGNOSIS — I890 Lymphedema, not elsewhere classified: Secondary | ICD-10-CM | POA: Diagnosis not present

## 2021-01-30 NOTE — Therapy (Signed)
[place] MAIN [facility] SERVICES [address] [place], [place], [address] Phone: [phone]   Fax:  [phone]  Occupational Therapy Treatment  Patient Details  Name: [name] MRN: [id number] Date of Birth: [birth year] Referring Provider (OT): [name], MD   Encounter Date: 01/30/2021   OT End of Session - 01/30/21 1412    Visit Number 8    Number of Visits 36    Date for OT Re-Evaluation 03/31/21    OT Start Time 1100    OT Stop Time 1201    OT Time Calculation (min) 61 min    Activity Tolerance Patient tolerated treatment well;No increased pain    Behavior During Therapy WFL for tasks assessed/performed           Past Medical History:  Diagnosis Date  . Abnormal chest CT 03/28/2017  . Allergy   . Anemia   . Angina pectoris ([place]) 04/02/2011  . Aortic aneurysm ([place]) 03/28/2017   Saw Dr Genevive Bi 04/16/17 - recommended f/u CT in 3 months.    . Atherosclerosis of both carotid arteries 07/17/2014  . Benign lipomatous neoplasm of skin and subcutaneous tissue of left arm 10/04/2013  . Bilateral carotid artery stenosis 08/30/2014   Overview:  Less than 50% 2015  . Bladder cancer ([place]) 03/05/2013  . CAD (coronary artery disease) 06/18/2014  . Cancer ([place]) 11-24-12   bladder. BCG treatments by Dr Jacqlyn Larsen.  . Chicken pox   . Chronic prostatitis 09/30/2012  . Colon polyp   . Congestive heart failure ([place]) 04/20/2014   Overview:  Overview:  With anterior mi and moderate lv dyfunction ef 35%  . Dizziness 12/22/2016  . 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 Eaton-Lambert syndrome ([place])   . Elevated prostate specific antigen (PSA) 09/30/2012  . Essential hypertension, benign   . Gross hematuria 09/30/2012  . Herpes zoster 11/28/2011   Overview:  with ocular involvement OD  . Hypercholesterolemia   . Hypertension, benign 04/02/2011  . Hypotension   . Moderate mitral insufficiency 05/18/2015  . Moderate tricuspid insufficiency 05/18/2015  .  Myasthenia gravis ([place])   . Myasthenia gravis without exacerbation ([place]) 03/31/2011  . Shingles 2013  . Squamous cell carcinoma of skin 11/22/2015   Left cheek. WD SCC. Re-shaved 01/14/2016. Excised 02/05/2016, margins free.  Marland Kitchen Squamous cell carcinoma of skin 11/03/2018   Right cheek ant. to sideburn. Poorly differentiated.  . Squamous cell carcinoma of skin 02/02/2019   Right mid dorsum forearm. WD SCC. EDC.    Past Surgical History:  Procedure Laterality Date  . APPENDECTOMY  1958  . CHOLECYSTECTOMY  1979  . HERNIA REPAIR  1970  . TONSILLECTOMY  1958    There were no vitals filed for this visit.   Subjective Assessment - 01/30/21 1417    Subjective  [name] presents for OT Rx visit 9/36 to address BLE lymphedema, L>R. He is accompanied by his [name] [name], who is here to learn how to apply multilayer compression wraps do assist with compliance with LE self care between visits.    Patient is accompanied by: Family member   daughter, [name]   Pertinent History chronic BLE lymphedema (LE) and associated pain/ discomfort. Altered sensation/numbness LLE, Hx lymphorrhea, hx non-pressure related leg ulcer, stasis dermatitis, AORTIC ANEURISM, BILATERAL CAROTID STENOSIS, BLADDER CANCER 03/05/13, CAD, CHF. htn, s/p squamous cell carcinoma 2020 x 2, 20117    Limitations decreased endurance, decreased balance, chronic leg swelling w paihn and hx skin ulcer  Repetition Increases Symptoms    Special Tests +Stemmer bilaterally, FOTO TBA Rx visit 1    Patient Stated Goals "get this swelling down in about 2 weeks"    Pain Onset Other (comment)   insideous onset ~ 5 yrs ago                       OT Treatments/Exercises (OP) - 01/30/21 0001      ADLs   ADL Education Given Yes      Manual Therapy   Manual Therapy Edema management;Compression Bandaging    Compression Bandaging Resumed multilayer gradient compression wraps from foot to tibial tuberosity of L leg.  Added a half roll of of Rosidal foam from largest calf to popliteal fossa  and added  a third  12 cm wide short stretch wrap from wisedcalf to popliteal fossa in effort to increase compression proximally towards LN behind the knee and to better facilitate flow through the "bottleneck" region at the medial knee. Pt denied c/o discomfort with extra wraps added.                  OT Education - 01/30/21 1419    Education Details Continued skilled Pt/caregiver education  And LE ADL training throughout visit for lymphedema self care/ home program, including compression wrapping, compression garment and device wear/care, lymphatic pumping ther ex, simple self-MLD, and skin care. Discussed progress towards goals.    Person(s) Educated Patient    Methods Explanation;Demonstration;Handout    Comprehension Verbalized understanding;Returned demonstration;Need further instruction               OT Long Term Goals - 01/01/21 1333      OT LONG TERM GOAL #1   Title Given this patient's risk-adjustment variables, and is Intake Functional Status score of /100 on the FOTO tool, patient will experience at least an increase in function of 5 points, or higher.    Baseline TBA    Time 12    Period Weeks    Status New    Target Date 03/31/21      OT LONG TERM GOAL #2   Title Pt will be able to apply knee length, multi-layer, short stretch compression wraps to one leg using correct gradient techniques with extra time (modified independence)  decrease limb volume, to decrease leg pain and limit infection risk, and to improve safe functional ambulation and mobility.    Baseline max a    Time 4    Period Days    Status New    Target Date --   4th OT Rx visit     OT LONG TERM GOAL #3   Title Patient will demonstrate understanding of LE precautions, prevention strategies and cellulitis signs and symptoms by verbalizing at least 4 examples using  printed reference( modified independence)  to limit  infection risk, recurrent wounds and LE progression.    Baseline Max A    Time 4    Period Days    Status New    Target Date --   4th OT Rx visit     OT LONG TERM GOAL #4   Title Pt will achieve 5%, or greater,  limb volume reduction on the R below the knee, and 10% reduction on the L below the knee (ankle to tibial tuberosity = A-D) during Intensive Phase CDT to achieve optimal limb volume reduction and to prevent re-accumulation of lymphatic congestion and fibrosis in the legs, to limit infection risk, to  improve functional ambulation and transfers, to improve functional performance of basic and instrumental ADLs, and to limit LE progression.    Baseline Max A    Time 12    Period Weeks    Status New    Target Date 03/31/21      OT LONG TERM GOAL #5   Title Pt will demonstrate and sustain a least 85% compliance performing all daily LE self-care home program components daily throughout Intensive Phase CDT, including recommended skin care regime, lymphatic pumping ther ex, 23/7 compression wraps and simple self-MLD, to ensure optimal limb volume reduction, to limit infection risk and to limit LE progression.    Baseline Max A    Time 12    Period Weeks    Status New    Target Date 03/31/21      Long Term Additional Goals   Additional Long Term Goals Yes      OT LONG TERM GOAL #6   Title Using assistive devices and additional time  (modified independence)  Pt will be able to don and doff appropriate daytime compression garments and HOS devices to limit lymphatic re-accumulation and LE progression with before transitioning to self-management phase of CDT.    Baseline Max A    Time 12    Period Weeks   issue date   Status New    Target Date 03/31/21                 Plan - 01/30/21 1413    Clinical Impression Statement Emphasis of visit today was on teaching neighbor/ friend to apply multilayer compression wraps to single leg between clinical visits to facilitate improved  compliance with LE self care nd to increase rate of limb volume reduction in preparation for compression garment fitting. To date Pt has made progress towards goals, but has not been compliant with compression. Once wraps are removed between visits and not replaced after bathing, leg swelling resumes and progress is lost. Having assistance from [name] will hopefully ramp up progress and enable Pt to achieve several OT goals. By end of session, Pt's [name] was able to apply multilayer wraps using correct gradient techniques. Cont as per POC.    OT Occupational Profile and History Comprehensive Assessment- Review of records and extensive additional review of physical, cognitive, psychosocial history related to current functional performance    Occupational performance deficits (Please refer to evaluation for details): ADL's;IADL's;Work;Leisure;Social Participation    Body Structure / Function / Physical Skills ADL;Decreased knowledge of use of DME;Balance;Pain;Edema;Endurance;IADL;Scar mobility;Mobility;Decreased knowledge of precautions;Skin integrity    Rehab Potential Good    Clinical Decision Making Several treatment options, min-mod task modification necessary    Comorbidities Affecting Occupational Performance: Presence of comorbidities impacting occupational performance    Comorbidities impacting occupational performance description: CVI, CAD, hx non-pressure related leg ulcer    Modification or Assistance to Complete Evaluation  Min-Moderate modification of tasks or assist with assess necessary to complete eval    OT Frequency 2x / week    OT Duration 12 weeks   follow aong and PRN for garments   OT Treatment/Interventions Self-care/ADL training;DME and/or AE instruction;Manual lymph drainage;Compression bandaging;Therapeutic activities;Therapeutic exercise;Scar mobilization;Other (comment);Manual Therapy;Patient/family education   skin care with low ph lotion and/ or castor oil to  imrove tissue hydration and mobility essential for lymphatic drainage   Plan Complete Decongestive Therapy (CDT) to sigle limb a a time to limit fall risk and impact on function. Pt and CG will be taught to apply  multilayer gradient compression wraps below knee to base of toes. CDT also to include manual lymphatic drainage, skin care, therapeutic exercise and fitting with appropriate knee length compression garments that are comfortable and Pt is able to don and doff using assistive devices PRN.    Recommended Other Services Consider fitting Pt with advanced sequential pneumatic compression device PRN.    Consulted and Agree with Plan of Care Patient;Family member/caregiver    Family Member Consulted daughter, [name]           Patient will benefit from skilled therapeutic intervention in order to improve the following deficits and impairments:   Body Structure / Function / Physical Skills: ADL,Decreased knowledge of use of DME,Balance,Pain,Edema,Endurance,IADL,Scar mobility,Mobility,Decreased knowledge of precautions,Skin integrity       Visit Diagnosis: Lymphedema, not elsewhere classified    Problem List Patient Active Problem List   Diagnosis Date Noted  . Rectal irritation 11/25/2020  . Lymphedema 11/19/2020  . Pressure injury of right buttock, stage 2 ([place]) 04/23/2020  . Acute exacerbation of CHF (congestive heart failure) ([place]) 01/13/2020  . Lower limb ulcer, calf, left, limited to breakdown of skin ([place]) 12/27/2019  . Dysuria 06/30/2019  . Hyperglycemia 06/30/2019  . LVH (left ventricular hypertrophy) due to hypertensive disease, without heart failure 05/26/2019  . Pseudophakia, left eye 11/26/2018  . Venous insufficiency of both lower extremities 10/13/2018  . Swelling of limb 10/12/2018  . Bilateral lower extremity edema 10/10/2018  . Absent pulse in lower extremity 10/10/2018  . 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 Eye pain 05/31/2018  . Mild aortic valve  stenosis 12/03/2017  . Epididymoorchitis 09/02/2017  . Acute cystitis with hematuria 08/11/2017  . Hydrocele, right 08/11/2017  . Aortic aneurysm ([place]) 03/28/2017  . Abnormal chest CT 03/28/2017  . Hypotension   . Dizziness 12/22/2016  . Leg weakness 03/11/2016  . Skin lesion 09/11/2015  . Moderate mitral insufficiency 05/18/2015  . Moderate tricuspid insufficiency 05/18/2015  . Benign essential hypertension 04/25/2015  . Rash 03/10/2015  . Health care maintenance 03/10/2015  . Bilateral carotid artery stenosis 08/30/2014  . Atherosclerosis of both carotid arteries 07/17/2014  . Chronic systolic CHF (congestive heart failure), NYHA class 3 ([place]) 07/17/2014  . CAD (coronary artery disease) 06/18/2014  . CHF (congestive heart failure) ([place]) 04/20/2014  . Congestive heart failure ([place]) 04/20/2014  . SOB (shortness of breath) 03/10/2014  . Abnormal EKG 03/10/2014  . Mixed hyperlipidemia 03/10/2014  . Benign lipomatous neoplasm of skin and subcutaneous tissue of left arm 10/04/2013  . Arm skin lesion, left 09/04/2013  . History of shingles 09/04/2013  . History of bladder cancer 03/05/2013  . Chronic prostatitis 09/30/2012  . Elevated prostate specific antigen (PSA) 09/30/2012  . Family history of malignant neoplasm of prostate 09/30/2012  . Gross hematuria 09/30/2012  . Incomplete emptying of bladder 09/30/2012  . Anemia 08/30/2012  . 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 Hypercholesterolemia 08/30/2012  . Herpes zoster 11/28/2011  . Hypertension, benign 04/02/2011  . Myasthenia gravis without exacerbation ([place]) 03/31/2011   Andrey Spearman, MS, OTR/L, [facility] 01/30/21 2:20 PM   [place] MAIN [facility] SERVICES [address] [place], [place], [address] Phone: [phone]   Fax:  [phone]  Name: JULIEN OSCAR MRN: [id number] Date of Birth: [birth year]

## 2021-01-31 DIAGNOSIS — I2510 Atherosclerotic heart disease of native coronary artery without angina pectoris: Secondary | ICD-10-CM | POA: Diagnosis not present

## 2021-01-31 DIAGNOSIS — I5022 Chronic systolic (congestive) heart failure: Secondary | ICD-10-CM | POA: Diagnosis not present

## 2021-02-01 ENCOUNTER — Ambulatory Visit: Payer: Medicare Other | Admitting: Occupational Therapy

## 2021-02-04 ENCOUNTER — Telehealth: Payer: Self-pay

## 2021-02-04 ENCOUNTER — Ambulatory Visit: Payer: Medicare Other | Admitting: Occupational Therapy

## 2021-02-04 ENCOUNTER — Other Ambulatory Visit: Payer: Self-pay

## 2021-02-04 DIAGNOSIS — I890 Lymphedema, not elsewhere classified: Secondary | ICD-10-CM | POA: Diagnosis not present

## 2021-02-04 NOTE — Patient Instructions (Signed)

## 2021-02-04 NOTE — Therapy (Signed)
[place] MAIN [facility] SERVICES [address] [place], [place], [address] Phone: [phone]   Fax:  [phone]  Occupational Therapy Treatment Note and Progress Report: Lymphedema Care  Patient Details  Name: [name] MRN: [id number] Date of Birth: 11-01-1925 Referring Provider (OT): [name], MD   Encounter Date: 02/04/2021   OT End of Session - 02/04/21 1207    Visit Number 9    Number of Visits 36    Date for OT Re-Evaluation 03/31/21    OT Start Time 1101    OT Stop Time 1884    OT Time Calculation (min) 64 min    Activity Tolerance Patient tolerated treatment well;No increased pain    Behavior During Therapy WFL for tasks assessed/performed           Past Medical History:  Diagnosis Date  . Abnormal chest CT 03/28/2017  . Allergy   . Anemia   . Angina pectoris ([place]) 04/02/2011  . Aortic aneurysm ([place]) 03/28/2017   Saw Dr Genevive Bi 04/16/17 - recommended f/u CT in 3 months.    . Atherosclerosis of both carotid arteries 07/17/2014  . Benign lipomatous neoplasm of skin and subcutaneous tissue of left arm 10/04/2013  . Bilateral carotid artery stenosis 08/30/2014   Overview:  Less than 50% 2015  . Bladder cancer ([place]) 03/05/2013  . CAD (coronary artery disease) 06/18/2014  . Cancer ([place]) 11-24-12   bladder. BCG treatments by Dr Jacqlyn Larsen.  . Chicken pox   . Chronic prostatitis 09/30/2012  . Colon polyp   . Congestive heart failure ([place]) 04/20/2014   Overview:  Overview:  With anterior mi and moderate lv dyfunction ef 35%  . Dizziness 12/22/2016  . 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 Eaton-Lambert syndrome ([place])   . Elevated prostate specific antigen (PSA) 09/30/2012  . Essential hypertension, benign   . Gross hematuria 09/30/2012  . Herpes zoster 11/28/2011   Overview:  with ocular involvement OD  . Hypercholesterolemia   . Hypertension, benign 04/02/2011  . Hypotension   . Moderate mitral insufficiency 05/18/2015  .  Moderate tricuspid insufficiency 05/18/2015  . Myasthenia gravis ([place])   . Myasthenia gravis without exacerbation ([place]) 03/31/2011  . Shingles 2013  . Squamous cell carcinoma of skin 11/22/2015   Left cheek. WD SCC. Re-shaved 01/14/2016. Excised 02/05/2016, margins free.  Marland Kitchen Squamous cell carcinoma of skin 11/03/2018   Right cheek ant. to sideburn. Poorly differentiated.  . Squamous cell carcinoma of skin 02/02/2019   Right mid dorsum forearm. WD SCC. EDC.    Past Surgical History:  Procedure Laterality Date  . APPENDECTOMY  1958  . CHOLECYSTECTOMY  1979  . HERNIA REPAIR  1970  . TONSILLECTOMY  1958    There were no vitals filed for this visit.   Subjective Assessment - 02/04/21 1248    Subjective  [name] presents for OT Rx visit 10/36 to address BLE lymphedema, L>R. He reports his friend/neighbor [name] over the weekend and she brought him several  larger pairs of shoes to try over the Pace as well. Pt reports he is unable to attend next scheduled OT visit as he has an appointment with is "heart doctor" .    Patient is accompanied by: Family member   daughter, [name]   Pertinent History chronic BLE lymphedema (LE) and associated pain/ discomfort. Altered sensation/numbness Pace, Hx lymphorrhea, hx non-pressure related leg ulcer, stasis dermatitis, AORTIC ANEURISM, BILATERAL CAROTID STENOSIS, BLADDER CANCER  03/05/13, CAD, CHF. htn, s/p squamous cell carcinoma 2020 x 2, 20117    Limitations decreased endurance, decreased balance, chronic leg swelling w paihn and hx skin ulcer    Repetition Increases Symptoms    Special Tests +[place] bilaterally, FOTO TBA Rx visit 1    Patient Stated Goals "get this swelling down in about 2 weeks"    Pain Onset Other (comment)   insideous onset ~ 5 yrs ago                       OT Treatments/Exercises (OP) - 02/04/21 0001      ADLs   ADL Education Given Yes      Manual Therapy   Manual  Therapy Edema management;Compression Bandaging;Manual Lymphatic Drainage (MLD)    Manual therapy comments --   Suspect RLE cellulitis   Edema Management skin care to Pace tbelow the knee throughout MLD to increase hydration, skin mobility and tissue health    Manual Lymphatic Drainage (MLD) MLD to Pace/LLQ utilizing short neck sequence ( CLAVICULAR LN ONLY in keeping w CAROTID precautions), deep abdominal pathway, functional inguinal LN and J strokes from proximal to disial to thigh, leg and dorsal foot. Good tolerance.    Compression Bandaging Multilayer gradient compression Pace from foot to tibial tuberosity of L leg. Added a half roll of of Rosidal foam from largest calf to popliteal fossa  and added  a third  12 cm wide short stretch wrap from wisedcalf to popliteal fossa in effort to increase compression proximally towards LN behind the knee and to better facilitate flow through the "bottleneck" region at the medial knee. Pt denied c/o discomfort with extra Pace added.                  OT Education - 02/04/21 1219    Education Details Reviewed lympedema precautions, including signs and symptoms of cellulitis, and recommended steps to take if/ when an infection is suspected.    Person(s) Educated Patient    Methods Explanation;Demonstration;Handout    Comprehension Verbalized understanding;Returned demonstration;Need further instruction               OT Long Term Goals - 02/04/21 1253      OT LONG TERM GOAL #1   Title Given this patient's risk-adjustment variables, and is Intake Functional Status score of /100 on the FOTO tool, patient will experience at least an increase in function of 5 points, or higher.    Baseline TBA    Time 12    Period Weeks    Status On-going    Target Date 03/31/21      OT LONG TERM GOAL #2   Title Pt will be able to apply knee length, multi-layer, short stretch compression Pace to one leg using correct gradient techniques with extra time  (modified independence)  decrease limb volume, to decrease leg pain and limit infection risk, and to improve safe functional ambulation and mobility.    Baseline max a    Time 4    Period Days    Status Partially Met   Max A from caregiver required     OT LONG TERM GOAL #3   Title Patient will demonstrate understanding of LE precautions, prevention strategies and cellulitis signs and symptoms by verbalizing at least 4 examples using  printed reference( modified independence)  to limit infection risk, recurrent wounds and LE progression.    Baseline Max A    Time 4  Period Days    Status Achieved      OT LONG TERM GOAL #4   Title Pt will achieve 5%, or greater,  limb volume reduction on the R below the knee, and 10% reduction on the L below the knee (ankle to tibial tuberosity = A-D) during Intensive Phase CDT to achieve optimal limb volume reduction and to prevent re-accumulation of lymphatic congestion and fibrosis in the legs, to limit infection risk, to improve functional ambulation and transfers, to improve functional performance of basic and instrumental ADLs, and to limit LE progression.    Baseline Max A    Time 12    Period Weeks    Status On-going   TBA next visit. . Visibble reduction in Pace appears > 5%. RLE swelling is significantly increased today due to suspected cellulitis.     OT LONG TERM GOAL #5   Title Pt will demonstrate and sustain a least 85% compliance performing all daily LE self-care home program components daily throughout Intensive Phase CDT, including recommended skin care regime, lymphatic pumping ther ex, 23/7 compression Pace and simple self-MLD, to ensure optimal limb volume reduction, to limit infection risk and to limit LE progression.    Baseline Max A    Time 12    Period Weeks    Status Partially Met   Pt has had difficulty with compliance with compression Pace to date. He has not been able to apply Pace himself. A very helpful friend/ neighbor  learned to apply Pace at last visit. She did apply them this past weekend   Target Date 03/31/21      OT LONG TERM GOAL #6   Title Using assistive devices and additional time  (modified independence)  Pt will be able to don and doff appropriate daytime compression garments and HOS devices to limit lymphatic re-accumulation and LE progression with before transitioning to self-management phase of CDT.    Baseline Max A    Time 12    Period Weeks   issue date   Status On-going                 Plan - 02/04/21 1222    Clinical Impression Statement Pace appears less swollen today. Pt reports , [name], the neighbor who attended visit last session, assisted him with compression Pace to Pace over the weekend. Reviewed progress towards all OT goals for LE treatment . Refer to [place] section for detailed notes. Suspect RLE ceulitis infection this morning with positive signs/ symptoms presenting, includingsoreness/ tenderness to light palpationn,  increased skin temperature, redness and swelling below the knee.Dr. Alveria Apley office on the way home in effort to expedite treatment. Provided MLD, skin care and gradient compression Pace to Pace as established with no increased pain. Cont as per POC.    OT Occupational Profile and History Comprehensive Assessment- Review of records and extensive additional review of physical, cognitive, psychosocial history related to current functional performance    Occupational performance deficits (Please refer to evaluation for details): ADL's;IADL's;Work;Leisure;Social Participation    Body Structure / Function / Physical Skills ADL;Decreased knowledge of use of DME;Balance;Pain;Edema;Endurance;IADL;Scar mobility;Mobility;Decreased knowledge of precautions;Skin integrity    Rehab Potential Good    Clinical Decision Making Several treatment options, min-mod task modification necessary    Comorbidities Affecting Occupational Performance: Presence of  comorbidities impacting occupational performance    Comorbidities impacting occupational performance description: CVI, CAD, hx non-pressure related leg ulcer    Modification or Assistance to Complete Evaluation  Min-Moderate  modification of tasks or assist with assess necessary to complete eval    OT Frequency 2x / week    OT Duration 12 weeks   follow aong and PRN for garments   OT Treatment/Interventions Self-care/ADL training;DME and/or AE instruction;Manual lymph drainage;Compression bandaging;Therapeutic activities;Therapeutic exercise;Scar mobilization;Other (comment);Manual Therapy;Patient/family education   skin care with low ph lotion and/ or castor oil to imrove tissue hydration and mobility essential for lymphatic drainage   Plan Complete Decongestive Therapy (CDT) to sigle limb a a time to limit fall risk and impact on function. Pt and CG will be taught to apply multilayer gradient compression Pace below knee to base of toes. CDT also to include manual lymphatic drainage, skin care, therapeutic exercise and fitting with appropriate knee length compression garments that are comfortable and Pt is able to don and doff using assistive devices PRN.    Recommended Other Services Consider fitting Pt with advanced sequential pneumatic compression device PRN.    Consulted and Agree with Plan of Care Patient;Family member/caregiver    Family Member Consulted daughter, [name]           Patient will benefit from skilled therapeutic intervention in order to improve the following deficits and impairments:   Body Structure / Function / Physical Skills: ADL,Decreased knowledge of use of DME,Balance,Pain,Edema,Endurance,IADL,Scar mobility,Mobility,Decreased knowledge of precautions,Skin integrity       Visit Diagnosis: Lymphedema, not elsewhere classified    Problem List Patient Active Problem List   Diagnosis Date Noted  . Rectal irritation 11/25/2020  . Lymphedema 11/19/2020  . Pressure  injury of right buttock, stage 2 ([place]) 04/23/2020  . Acute exacerbation of CHF (congestive heart failure) ([place]) 01/13/2020  . Lower limb ulcer, calf, left, limited to breakdown of skin ([place]) 12/27/2019  . Dysuria 06/30/2019  . Hyperglycemia 06/30/2019  . LVH (left ventricular hypertrophy) due to hypertensive disease, without heart failure 05/26/2019  . Pseudophakia, left eye 11/26/2018  . Venous insufficiency of both lower extremities 10/13/2018  . Swelling of limb 10/12/2018  . Bilateral lower extremity edema 10/10/2018  . Absent pulse in lower extremity 10/10/2018  . 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 Eye pain 05/31/2018  . Mild aortic valve stenosis 12/03/2017  . Epididymoorchitis 09/02/2017  . Acute cystitis with hematuria 08/11/2017  . Hydrocele, right 08/11/2017  . Aortic aneurysm ([place]) 03/28/2017  . Abnormal chest CT 03/28/2017  . Hypotension   . Dizziness 12/22/2016  . Leg weakness 03/11/2016  . Skin lesion 09/11/2015  . Moderate mitral insufficiency 05/18/2015  . Moderate tricuspid insufficiency 05/18/2015  . Benign essential hypertension 04/25/2015  . Rash 03/10/2015  . Health care maintenance 03/10/2015  . Bilateral carotid artery stenosis 08/30/2014  . Atherosclerosis of both carotid arteries 07/17/2014  . Chronic systolic CHF (congestive heart failure), NYHA class 3 ([place]) 07/17/2014  . CAD (coronary artery disease) 06/18/2014  . CHF (congestive heart failure) ([place]) 04/20/2014  . Congestive heart failure ([place]) 04/20/2014  . SOB (shortness of breath) 03/10/2014  . Abnormal EKG 03/10/2014  . Mixed hyperlipidemia 03/10/2014  . Benign lipomatous neoplasm of skin and subcutaneous tissue of left arm 10/04/2013  . Arm skin lesion, left 09/04/2013  . History of shingles 09/04/2013  . History of bladder cancer 03/05/2013  . Chronic prostatitis 09/30/2012  . Elevated prostate specific antigen (PSA) 09/30/2012  . Family history of malignant neoplasm of prostate  09/30/2012  . Gross hematuria 09/30/2012  . Incomplete emptying of bladder 09/30/2012  . Anemia 08/30/2012  . 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 Hypercholesterolemia 08/30/2012  . Herpes zoster 11/28/2011  . Hypertension, benign 04/02/2011  . Myasthenia gravis without exacerbation ([place]) 03/31/2011    Andrey Spearman, MS, OTR/L, [facility] 02/04/21 1:01 PM   [place] MAIN [facility] SERVICES [address] [place], [place], [address] Phone: [phone]   Fax:  [phone]  Name: [name] MRN: [id number] Date of Birth: 21-Nov-1925

## 2021-02-04 NOTE — Telephone Encounter (Signed)
[name] at the Lymphedema clinic calling, this mutual pt in her office today with possible cellulitis, she would like him worked in our schedule    Called pt to work in him to see Dr Raliegh Ip this week, no answer left message on pt's VM to call here    Called LM for [name] we tried to call this pt to work him in with Dr Raliegh Ip this week, no answer, if she could let this pt know we tried to call him

## 2021-02-05 ENCOUNTER — Ambulatory Visit: Payer: Medicare Other | Admitting: Dermatology

## 2021-02-05 DIAGNOSIS — I872 Venous insufficiency (chronic) (peripheral): Secondary | ICD-10-CM

## 2021-02-05 MED ORDER — DOXYCYCLINE MONOHYDRATE 100 MG PO CAPS: 100.0000 mg | ORAL_CAPSULE | Freq: Two times a day (BID) | ORAL | 0 refills | Status: AC

## 2021-02-05 MED ORDER — MOMETASONE FUROATE 0.1 % EX CREA: TOPICAL_CREAM | CUTANEOUS | 1 refills | Status: DC

## 2021-02-05 MED ORDER — MUPIROCIN 2 % EX OINT: TOPICAL_OINTMENT | CUTANEOUS | 1 refills | Status: DC

## 2021-02-05 NOTE — Progress Notes (Signed)
   Follow-Up Visit   Subjective  [name] is a 87 y.o. male who presents for the following: Skin Problem (Patient here today to have right lower leg checked. He is being seen at the lymphedema clinic and the right leg has started to leak. They referred him to [place] in case he needs to be put on any antibiotics. The right lower leg has been wrapped a few times and left leg continues to be wrapped. ).  Patient accompanied by son.   The following portions of the chart were reviewed this encounter and updated as appropriate:       Review of Systems:  No other skin or systemic complaints except as noted in HPI or Assessment and Plan.  Objective  Well appearing patient in no apparent distress; mood and affect are within normal limits.  A focused examination was performed including right leg. Relevant physical exam findings are noted in the Assessment and Plan.  Objective  Right Lower Leg: 2 to 3+ pitting edema with erythema and eroded bullae mid pretibia    Assessment & Plan  Venous stasis dermatitis of right lower extremity Right Lower Leg  Doubt cellulitis at this time, but pt is at risk for developing due to eroded bullae/open sore on leg. Needs daily compression wraps since severe edema is causing skin redness, tenderness and bullae formation.  Currently followed by lymphedema clinic- continue compression protocols to BL legs Start doxycycline monohydrate 100mg  BID x 2 weeks. Start mometasone cream to leg after showering followed by mupirocin to open sores prior to compression wrapping.  Stasis in the legs causes chronic leg swelling, which may result in itchy or painful rashes, skin discoloration, skin texture changes, and sometimes ulceration.  Recommend daily compression hose/stockings- easiest to put on first thing in morning, remove at bedtime.  Elevate legs as much as possible. Avoid salt/sodium rich foods.    Topical steroids (such as triamcinolone, fluocinolone,  fluocinonide, mometasone, clobetasol, halobetasol, betamethasone, hydrocortisone) can cause thinning and lightening of the skin if they are used for too long in the same area. Your physician has selected the right strength medicine for your problem and area affected on the body. Please use your medication only as directed by your physician to prevent side effects.     Ordered Medications: mupirocin ointment (BACTROBAN) 2 % mometasone (ELOCON) 0.1 % cream doxycycline (MONODOX) 100 MG capsule  Return in about 3 weeks (around 02/26/2021).  [name], RMA, am acting as scribe for [name], MD . Documentation: I have reviewed the above documentation for accuracy and completeness, and I agree with the above.  [name] MD

## 2021-02-05 NOTE — Patient Instructions (Addendum)
Start doxycycline monohydrate 100mg  twice a day with food x 2 weeks. Start mometasone cream to leg after showering followed by mupirocin to open sores prior to wrapping.   Topical steroids (such as triamcinolone, fluocinolone, fluocinonide, mometasone, clobetasol, halobetasol, betamethasone, hydrocortisone) can cause thinning and lightening of the skin if they are used for too long in the same area. Your physician has selected the right strength medicine for your problem and area affected on the body. Please use your medication only as directed by your physician to prevent side effects.   If you have any questions or concerns for your doctor, please call our main line at [phone] and press option 4 to reach your doctor's medical assistant. If no one answers, please leave a voicemail as directed and we will return your call as soon as possible. Messages left after 4 pm will be answered the following business day.   You may also send [place] a message via [place]. We typically respond to MyChart messages within 1-2 business days.  For prescription refills, please ask your pharmacy to contact our office. Our fax number is [phone].  If you have an urgent issue when the clinic is closed that cannot wait until the next business day, you can page your doctor at the number below.    Please note that while we do our best to be available for urgent issues outside of office hours, we are not available 24/7.   If you have an urgent issue and are unable to reach [place], you may choose to seek medical care at your doctor's office, retail clinic, urgent care center, or emergency room.  If you have a medical emergency, please immediately call 911 or go to the emergency department.  Pager Numbers  - Dr. Nehemiah Massed: [phone]  - Dr. Laurence Ferrari: [phone]  - Dr. Nicole Kindred: [phone]  In the event of inclement weather, please call our main line at [phone] for an update on the status of any delays or  closures.  Dermatology Medication Tips: Please keep the boxes that topical medications come in in order to help keep track of the instructions about where and how to use these. Pharmacies typically print the medication instructions only on the boxes and not directly on the medication tubes.   If your medication is too expensive, please contact our office at [phone] option 4 or send [place] a message through [place].   We are unable to tell what your co-pay for medications will be in advance as this is different depending on your insurance coverage. However, we may be able to find a substitute medication at lower cost or fill out paperwork to get insurance to cover a needed medication.   If a prior authorization is required to get your medication covered by your insurance company, please allow [place] 1-2 business days to complete this process.  Drug prices often vary depending on where the prescription is filled and some pharmacies may offer cheaper prices.  The website www.goodrx.com contains coupons for medications through different pharmacies. The prices here do not account for what the cost may be with help from insurance (it may be cheaper with your insurance), but the website can give you the price if you did not use any insurance.  - You can print the associated coupon and take it with your prescription to the pharmacy.  - You may also stop by our office during regular business hours and pick up a GoodRx coupon card.  - If you need your prescription sent electronically  to a different pharmacy, notify our office through [facility] or by phone at [phone] option 4.

## 2021-02-06 ENCOUNTER — Ambulatory Visit: Payer: Medicare Other | Admitting: Occupational Therapy

## 2021-02-06 ENCOUNTER — Other Ambulatory Visit: Payer: Self-pay

## 2021-02-06 DIAGNOSIS — I2510 Atherosclerotic heart disease of native coronary artery without angina pectoris: Secondary | ICD-10-CM | POA: Diagnosis not present

## 2021-02-06 DIAGNOSIS — I10 Essential (primary) hypertension: Secondary | ICD-10-CM | POA: Diagnosis not present

## 2021-02-06 DIAGNOSIS — I890 Lymphedema, not elsewhere classified: Secondary | ICD-10-CM

## 2021-02-06 DIAGNOSIS — I5022 Chronic systolic (congestive) heart failure: Secondary | ICD-10-CM | POA: Diagnosis not present

## 2021-02-06 DIAGNOSIS — I350 Nonrheumatic aortic (valve) stenosis: Secondary | ICD-10-CM | POA: Diagnosis not present

## 2021-02-06 NOTE — Therapy (Signed)
[name] MAIN [facility] SERVICES [address] [place], [place], [address] Phone: [phone]   Fax:  [phone]  Occupational Therapy Treatment  Patient Details  Name: [name] MRN: [id number] Date of Birth: 1926/08/19 Referring Provider (OT): [name], MD   Encounter Date: 02/06/2021   OT End of Session - 02/06/21 1233    Visit Number 11   Number of Visits 36    Date for OT Re-Evaluation 03/31/21    OT Start Time 1147    OT Stop Time 1217    OT Time Calculation (min) 30 min    Equipment Utilized During Treatment mupirocin ointment (bactroban), mometasone cream (elocon)- both brought from dermatologist.    Activity Tolerance Patient tolerated treatment well;No increased pain    Behavior During Therapy WFL for tasks assessed/performed           Past Medical History:  Diagnosis Date  . Abnormal chest CT 03/28/2017  . Allergy   . Anemia   . Angina pectoris ([place]) 04/02/2011  . Aortic aneurysm ([place]) 03/28/2017   Saw Dr Genevive Bi 04/16/17 - recommended f/u CT in 3 months.    . Atherosclerosis of both carotid arteries 07/17/2014  . Benign lipomatous neoplasm of skin and subcutaneous tissue of left arm 10/04/2013  . Bilateral carotid artery stenosis 08/30/2014   Overview:  Less than 50% 2015  . Bladder cancer ([place]) 03/05/2013  . CAD (coronary artery disease) 06/18/2014  . Cancer ([place]) 11-24-12   bladder. BCG treatments by Dr Jacqlyn Larsen.  . Chicken pox   . Chronic prostatitis 09/30/2012  . Colon polyp   . Congestive heart failure ([place]) 04/20/2014   Overview:  Overview:  With anterior mi and moderate lv dyfunction ef 35%  . Dizziness 12/22/2016  . 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 Eaton-Lambert syndrome ([place])   . Elevated prostate specific antigen (PSA) 09/30/2012  . Essential hypertension, benign   . Gross hematuria 09/30/2012  . Herpes zoster 11/28/2011   Overview:  with ocular involvement OD  . Hypercholesterolemia   .  Hypertension, benign 04/02/2011  . Hypotension   . Moderate mitral insufficiency 05/18/2015  . Moderate tricuspid insufficiency 05/18/2015  . Myasthenia gravis ([place])   . Myasthenia gravis without exacerbation ([place]) 03/31/2011  . Shingles 2013  . Squamous cell carcinoma of skin 11/22/2015   Left cheek. WD SCC. Re-shaved 01/14/2016. Excised 02/05/2016, margins free.  Marland Kitchen Squamous cell carcinoma of skin 11/03/2018   Right cheek ant. to sideburn. Poorly differentiated.  . Squamous cell carcinoma of skin 02/02/2019   Right mid dorsum forearm. WD SCC. EDC.    Past Surgical History:  Procedure Laterality Date  . APPENDECTOMY  1958  . CHOLECYSTECTOMY  1979  . HERNIA REPAIR  1970  . TONSILLECTOMY  1958    There were no vitals filed for this visit.   Subjective Assessment - 02/06/21 1251    Subjective  [name] presents for OT Rx visit 11/36 to address BLE lymphedema, L>R. He is accompanied by his daughter. We were able to work [name] per his request to apply dermatology topicals and short stretch. knee length RLE gradient compression wraps in effort to limit worsening infection.    Patient is accompanied by: Family member   daughter, Ellie Spickler   Pertinent History chronic BLE lymphedema (LE) and associated pain/ discomfort. Altered sensation/numbness LLE, Hx lymphorrhea, hx non-pressure related leg ulcer, stasis dermatitis, AORTIC ANEURISM, BILATERAL CAROTID STENOSIS, BLADDER CANCER 03/05/13, CAD, CHF. htn, s/p squamous  cell carcinoma 2020 x 2, 20117    Limitations decreased endurance, decreased balance, chronic leg swelling w paihn and hx skin ulcer    Repetition Increases Symptoms    Special Tests +Stemmer bilaterally, FOTO TBA Rx visit 1    Patient Stated Goals "get this swelling down in about 2 weeks"    Pain Onset Other (comment)   insideous onset ~ 5 yrs ago                       OT Treatments/Exercises (OP) - 02/06/21 1253      ADLs   ADL Education Given Yes       Manual Therapy   Manual Therapy Edema management;Compression Bandaging    Edema Management skin care with bactroban and elocon cream prior to wrapping.    Compression Bandaging knee length. short stretch compression wraps applied to RLE only as established in keeping with Dr's recommendation to limit infection progression and facilitate wound healing.                  OT Education - 02/06/21 1256    Education Details reviewed steps for applying topical meds from dermatologist and stressed importance of using clean stockinett for each application of wraps to limit infection risk.    Person(s) Educated Patient    Methods Explanation;Demonstration;Handout    Comprehension Verbalized understanding;Returned demonstration;Need further instruction               OT Long Term Goals - 02/04/21 1253      OT LONG TERM GOAL #1   Title Given this patient's risk-adjustment variables, and is Intake Functional Status score of /100 on the FOTO tool, patient will experience at least an increase in function of 5 points, or higher.    Baseline TBA    Time 12    Period Weeks    Status On-going    Target Date 03/31/21      OT LONG TERM GOAL #2   Title Pt will be able to apply knee length, multi-layer, short stretch compression wraps to one leg using correct gradient techniques with extra time (modified independence)  decrease limb volume, to decrease leg pain and limit infection risk, and to improve safe functional ambulation and mobility.    Baseline max a    Time 4    Period Days    Status Partially Met   Max A from caregiver required     OT LONG TERM GOAL #3   Title Patient will demonstrate understanding of LE precautions, prevention strategies and cellulitis signs and symptoms by verbalizing at least 4 examples using  printed reference( modified independence)  to limit infection risk, recurrent wounds and LE progression.    Baseline Max A    Time 4    Period Days    Status Achieved       OT LONG TERM GOAL #4   Title Pt will achieve 5%, or greater,  limb volume reduction on the R below the knee, and 10% reduction on the L below the knee (ankle to tibial tuberosity = A-D) during Intensive Phase CDT to achieve optimal limb volume reduction and to prevent re-accumulation of lymphatic congestion and fibrosis in the legs, to limit infection risk, to improve functional ambulation and transfers, to improve functional performance of basic and instrumental ADLs, and to limit LE progression.    Baseline Max A    Time 12    Period Weeks    Status On-going  TBA next visit. . Visibble reduction in LLE appears > 5%. RLE swelling is significantly increased today due to suspected cellulitis.     OT LONG TERM GOAL #5   Title Pt will demonstrate and sustain a least 85% compliance performing all daily LE self-care home program components daily throughout Intensive Phase CDT, including recommended skin care regime, lymphatic pumping ther ex, 23/7 compression wraps and simple self-MLD, to ensure optimal limb volume reduction, to limit infection risk and to limit LE progression.    Baseline Max A    Time 12    Period Weeks    Status Partially Met   Pt has had difficulty with compliance with compression wraps to date. He has not been able to apply wraps himself. A very helpful friend/ neighbor learned to apply wraps at last visit. She did apply them this past weekend   Target Date 03/31/21      OT LONG TERM GOAL #6   Title Using assistive devices and additional time  (modified independence)  Pt will be able to don and doff appropriate daytime compression garments and HOS devices to limit lymphatic re-accumulation and LE progression with before transitioning to self-management phase of CDT.    Baseline Max A    Time 12    Period Weeks   issue date   Status On-going                 Plan - 02/06/21 1236    Clinical Impression Statement Worked Pt in for OT session after his cardiology  appointment to apply compression wraps to the RLE as recommended by his dermatologist. Pt saw dermatologist yesterday who ruled out cellulitis according to note. Pt perscribed doxycycline, bactroban ointment and mometasone cream to be applied before wrapping. By visual assessment RLE limb volume is reduced by ~50% compared with swelling observed below the knee at last visit. Leg remains reddened and skin is hot to touch. Blisters are deflated , but they are dry. Applied bactroban ointment to blisters and covered before applying cream to leg below the knee. Applied multilayered , short stretch compression wraps from base of toes to tibial tuberosity. Pt able to fit street shoes over wraps and able to ambulate without LOB down hallway to waiting area. Daughter transported Pt to fronmt door of hospital. Nurse and neighbor, Edwena Felty, has been taught to apply wraps using correct techniques. She will reapply them during visit interval as directed.    OT Occupational Profile and History Comprehensive Assessment- Review of records and extensive additional review of physical, cognitive, psychosocial history related to current functional performance    Occupational performance deficits (Please refer to evaluation for details): ADL's;IADL's;Work;Leisure;Social Participation    Body Structure / Function / Physical Skills ADL;Decreased knowledge of use of DME;Balance;Pain;Edema;Endurance;IADL;Scar mobility;Mobility;Decreased knowledge of precautions;Skin integrity    Rehab Potential Good    Clinical Decision Making Several treatment options, min-mod task modification necessary    Comorbidities Affecting Occupational Performance: Presence of comorbidities impacting occupational performance    Comorbidities impacting occupational performance description: CVI, CAD, hx non-pressure related leg ulcer    Modification or Assistance to Complete Evaluation  Min-Moderate modification of tasks or assist with assess necessary to  complete eval    OT Frequency 2x / week    OT Duration 12 weeks   follow aong and PRN for garments   OT Treatment/Interventions Self-care/ADL training;DME and/or AE instruction;Manual lymph drainage;Compression bandaging;Therapeutic activities;Therapeutic exercise;Scar mobilization;Other (comment);Manual Therapy;Patient/family education   skin care with low ph lotion and/  or castor oil to imrove tissue hydration and mobility essential for lymphatic drainage   Plan Complete Decongestive Therapy (CDT) to sigle limb a a time to limit fall risk and impact on function. Pt and CG will be taught to apply multilayer gradient compression wraps below knee to base of toes. CDT also to include manual lymphatic drainage, skin care, therapeutic exercise and fitting with appropriate knee length compression garments that are comfortable and Pt is able to don and doff using assistive devices PRN.    Recommended Other Services Consider fitting Pt with advanced sequential pneumatic compression device PRN.    Consulted and Agree with Plan of Care Patient;Family member/caregiver    Family Member Consulted daughter, [name]           Patient will benefit from skilled therapeutic intervention in order to improve the following deficits and impairments:   Body Structure / Function / Physical Skills: ADL,Decreased knowledge of use of DME,Balance,Pain,Edema,Endurance,IADL,Scar mobility,Mobility,Decreased knowledge of precautions,Skin integrity       Visit Diagnosis: Lymphedema, not elsewhere classified    Problem List Patient Active Problem List   Diagnosis Date Noted  . Rectal irritation 11/25/2020  . Lymphedema 11/19/2020  . Pressure injury of right buttock, stage 2 ([place]) 04/23/2020  . Acute exacerbation of CHF (congestive heart failure) ([place]) 01/13/2020  . Lower limb ulcer, calf, left, limited to breakdown of skin ([place]) 12/27/2019  . Dysuria 06/30/2019  . Hyperglycemia 06/30/2019  . LVH (left ventricular  hypertrophy) due to hypertensive disease, without heart failure 05/26/2019  . Pseudophakia, left eye 11/26/2018  . Venous insufficiency of both lower extremities 10/13/2018  . Swelling of limb 10/12/2018  . Bilateral lower extremity edema 10/10/2018  . Absent pulse in lower extremity 10/10/2018  . 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 Eye pain 05/31/2018  . Mild aortic valve stenosis 12/03/2017  . Epididymoorchitis 09/02/2017  . Acute cystitis with hematuria 08/11/2017  . Hydrocele, right 08/11/2017  . Aortic aneurysm ([place]) 03/28/2017  . Abnormal chest CT 03/28/2017  . Hypotension   . Dizziness 12/22/2016  . Leg weakness 03/11/2016  . Skin lesion 09/11/2015  . Moderate mitral insufficiency 05/18/2015  . Moderate tricuspid insufficiency 05/18/2015  . Benign essential hypertension 04/25/2015  . Rash 03/10/2015  . Health care maintenance 03/10/2015  . Bilateral carotid artery stenosis 08/30/2014  . Atherosclerosis of both carotid arteries 07/17/2014  . Chronic systolic CHF (congestive heart failure), NYHA class 3 ([place]) 07/17/2014  . CAD (coronary artery disease) 06/18/2014  . CHF (congestive heart failure) ([place]) 04/20/2014  . Congestive heart failure ([place]) 04/20/2014  . SOB (shortness of breath) 03/10/2014  . Abnormal EKG 03/10/2014  . Mixed hyperlipidemia 03/10/2014  . Benign lipomatous neoplasm of skin and subcutaneous tissue of left arm 10/04/2013  . Arm skin lesion, left 09/04/2013  . History of shingles 09/04/2013  . History of bladder cancer 03/05/2013  . Chronic prostatitis 09/30/2012  . Elevated prostate specific antigen (PSA) 09/30/2012  . Family history of malignant neoplasm of prostate 09/30/2012  . Gross hematuria 09/30/2012  . Incomplete emptying of bladder 09/30/2012  . Anemia 08/30/2012  . 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 Hypercholesterolemia 08/30/2012  . Herpes zoster 11/28/2011  . Hypertension, benign 04/02/2011  . Myasthenia  gravis without exacerbation ([place]) 03/31/2011    Andrey Spearman, MS, OTR/L, [facility] 02/06/21 12:59 PM   [place] MAIN [facility] SERVICES [address] [place], [place], [address] Phone: [phone]   Fax:  [phone]  Name:  [name] MRN: [id number] Date of Birth: [birth year]

## 2021-02-08 ENCOUNTER — Ambulatory Visit: Payer: Medicare Other | Admitting: Occupational Therapy

## 2021-02-11 ENCOUNTER — Ambulatory Visit: Payer: Medicare Other | Admitting: Occupational Therapy

## 2021-02-11 ENCOUNTER — Other Ambulatory Visit: Payer: Self-pay

## 2021-02-11 DIAGNOSIS — I890 Lymphedema, not elsewhere classified: Secondary | ICD-10-CM | POA: Diagnosis not present

## 2021-02-11 NOTE — Therapy (Signed)
[place] MAIN [facility] SERVICES [address] [place], [place], [address] Phone: [phone]   Fax:  [phone]  Occupational Therapy Treatment  Patient Details  Name: [name] MRN: [id number] Date of Birth: 06-27-1926 Referring Provider (OT): [name], MD   Encounter Date: 02/11/2021   OT End of Session - 02/11/21 1626    Visit Number 12    Number of Visits 36    Date for OT Re-Evaluation 03/31/21    OT Start Time 1113    OT Stop Time 1215    OT Time Calculation (min) 62 min    Equipment Utilized During Treatment mupirocin ointment (bactroban), mometasone cream (elocon)- both brought from dermatologist.    Activity Tolerance Patient tolerated treatment well;No increased pain    Behavior During Therapy WFL for tasks assessed/performed           Past Medical History:  Diagnosis Date  . Abnormal chest CT 03/28/2017  . Allergy   . Anemia   . Angina pectoris ([place]) 04/02/2011  . Aortic aneurysm ([place]) 03/28/2017   Saw Dr Genevive Bi 04/16/17 - recommended f/u CT in 3 months.    . Atherosclerosis of both carotid arteries 07/17/2014  . Benign lipomatous neoplasm of skin and subcutaneous tissue of left arm 10/04/2013  . Bilateral carotid artery stenosis 08/30/2014   Overview:  Less than 50% 2015  . Bladder cancer ([place]) 03/05/2013  . CAD (coronary artery disease) 06/18/2014  . Cancer ([place]) 11-24-12   bladder. BCG treatments by Dr Jacqlyn Larsen.  . Chicken pox   . Chronic prostatitis 09/30/2012  . Colon polyp   . Congestive heart failure ([place]) 04/20/2014   Overview:  Overview:  With anterior mi and moderate lv dyfunction ef 35%  . Dizziness 12/22/2016  . 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 Eaton-Lambert syndrome ([place])   . Elevated prostate specific antigen (PSA) 09/30/2012  . Essential hypertension, benign   . Gross hematuria 09/30/2012  . Herpes zoster 11/28/2011   Overview:  with ocular involvement OD  . Hypercholesterolemia   .  Hypertension, benign 04/02/2011  . Hypotension   . Moderate mitral insufficiency 05/18/2015  . Moderate tricuspid insufficiency 05/18/2015  . Myasthenia gravis ([place])   . Myasthenia gravis without exacerbation ([place]) 03/31/2011  . Shingles 2013  . Squamous cell carcinoma of skin 11/22/2015   Left cheek. WD SCC. Re-shaved 01/14/2016. Excised 02/05/2016, margins free.  Marland Kitchen Squamous cell carcinoma of skin 11/03/2018   Right cheek ant. to sideburn. Poorly differentiated.  . Squamous cell carcinoma of skin 02/02/2019   Right mid dorsum forearm. WD SCC. EDC.    Past Surgical History:  Procedure Laterality Date  . APPENDECTOMY  1958  . CHOLECYSTECTOMY  1979  . HERNIA REPAIR  1970  . TONSILLECTOMY  1958    There were no vitals filed for this visit.   Subjective Assessment - 02/11/21 1628    Subjective  [name] presents for OT Rx visit 12/36 to address BLE lymphedema, L>R. He is un accompanied today, but his daughter calls during treatment session to discuss skin condition and bandaging plan for RLE. Pt c/o of soreness at lateral R leg with palpation, but does not rate pain numerically.    Patient is accompanied by: Family member   daughter, Twain Stenseth   Pertinent History chronic BLE lymphedema (LE) and associated pain/ discomfort. Altered sensation/numbness LLE, Hx lymphorrhea, hx non-pressure related leg ulcer, stasis dermatitis, AORTIC ANEURISM, BILATERAL CAROTID STENOSIS, BLADDER CANCER 03/05/13, CAD, CHF.  htn, s/p squamous cell carcinoma 2020 x 2, 20117    Limitations decreased endurance, decreased balance, chronic leg swelling w paihn and hx skin ulcer    Repetition Increases Symptoms    Special Tests +Stemmer bilaterally, FOTO TBA Rx visit 1    Patient Stated Goals "get this swelling down in about 2 weeks"    Pain Onset Other (comment)   insideous onset ~ 5 yrs ago                       OT Treatments/Exercises (OP) - 02/11/21 1630      ADLs   ADL Education Given Yes       Manual Therapy   Manual Therapy Edema management;Compression Bandaging    Edema Management skin care with bactroban and elocon cream prior to wrapping.    Manual Lymphatic Drainage (MLD) MLD to LLE/LLQ utilizing short neck sequence ( CLAVICULAR LN ONLY in keeping w CAROTID precautions), deep abdominal pathway, functional inguinal LN and J strokes from proximal to disial to thigh, leg and dorsal foot. Good tolerance.    Compression Bandaging knee length. short stretch compression wraps applied to RLE only as established in keeping with Dr's recommendation to limit infection progression and facilitate wound healing.                  OT Education - 02/11/21 1631    Education Details Continued skilled Pt/caregiver education  And LE ADL training throughout visit for lymphedema self care/ home program, including compression wrapping, compression garment and device wear/care, lymphatic pumping ther ex, simple self-MLD, and skin care. Discussed progress towards goals.    Person(s) Educated Patient    Methods Explanation;Demonstration;Handout    Comprehension Verbalized understanding;Returned demonstration;Need further instruction               OT Long Term Goals - 02/04/21 1253      OT LONG TERM GOAL #1   Title Given this patient's risk-adjustment variables, and is Intake Functional Status score of /100 on the FOTO tool, patient will experience at least an increase in function of 5 points, or higher.    Baseline TBA    Time 12    Period Weeks    Status On-going    Target Date 03/31/21      OT LONG TERM GOAL #2   Title Pt will be able to apply knee length, multi-layer, short stretch compression wraps to one leg using correct gradient techniques with extra time (modified independence)  decrease limb volume, to decrease leg pain and limit infection risk, and to improve safe functional ambulation and mobility.    Baseline max a    Time 4    Period Days    Status Partially Met   Max  A from caregiver required     OT LONG TERM GOAL #3   Title Patient will demonstrate understanding of LE precautions, prevention strategies and cellulitis signs and symptoms by verbalizing at least 4 examples using  printed reference( modified independence)  to limit infection risk, recurrent wounds and LE progression.    Baseline Max A    Time 4    Period Days    Status Achieved      OT LONG TERM GOAL #4   Title Pt will achieve 5%, or greater,  limb volume reduction on the R below the knee, and 10% reduction on the L below the knee (ankle to tibial tuberosity = A-D) during Intensive Phase CDT to achieve  optimal limb volume reduction and to prevent re-accumulation of lymphatic congestion and fibrosis in the legs, to limit infection risk, to improve functional ambulation and transfers, to improve functional performance of basic and instrumental ADLs, and to limit LE progression.    Baseline Max A    Time 12    Period Weeks    Status On-going   TBA next visit. . Visibble reduction in LLE appears > 5%. RLE swelling is significantly increased today due to suspected cellulitis.     OT LONG TERM GOAL #5   Title Pt will demonstrate and sustain a least 85% compliance performing all daily LE self-care home program components daily throughout Intensive Phase CDT, including recommended skin care regime, lymphatic pumping ther ex, 23/7 compression wraps and simple self-MLD, to ensure optimal limb volume reduction, to limit infection risk and to limit LE progression.    Baseline Max A    Time 12    Period Weeks    Status Partially Met   Pt has had difficulty with compliance with compression wraps to date. He has not been able to apply wraps himself. A very helpful friend/ neighbor learned to apply wraps at last visit. She did apply them this past weekend   Target Date 03/31/21      OT LONG TERM GOAL #6   Title Using assistive devices and additional time  (modified independence)  Pt will be able to don and  doff appropriate daytime compression garments and HOS devices to limit lymphatic re-accumulation and LE progression with before transitioning to self-management phase of CDT.    Baseline Max A    Time 12    Period Weeks   issue date   Status On-going                 Plan - 02/11/21 1631    Clinical Impression Statement No bruises or discoloration observed. at area of soreness with gentle palpation durng MLD on R lateral leg.  R leg remains moderately to the touch and reddened from tibial tuberosity to toes. Stekmmer is strongly positive. Ot appears compression wraps may have forces fluid distally. Previously open , weeping areas where skin blistered opened are healing well, although apear ~ 50% closed. Increasing interstitial pressure with gradient compression aids approximation of wound edges and facilitates healing with improved lymphatic decongestion and tissue oxygenation. Pt tolerated RLE MLD without mild pain at lateral leg. Cont  to monitoir closely as Pt remains at risk for recurrence of stasis dermatitis, additional wounds and onset of cellulitis.. Cont as per POC.    OT Occupational Profile and History Comprehensive Assessment- Review of records and extensive additional review of physical, cognitive, psychosocial history related to current functional performance    Occupational performance deficits (Please refer to evaluation for details): ADL's;IADL's;Work;Leisure;Social Participation    Body Structure / Function / Physical Skills ADL;Decreased knowledge of use of DME;Balance;Pain;Edema;Endurance;IADL;Scar mobility;Mobility;Decreased knowledge of precautions;Skin integrity    Rehab Potential Good    Clinical Decision Making Several treatment options, min-mod task modification necessary    Comorbidities Affecting Occupational Performance: Presence of comorbidities impacting occupational performance    Comorbidities impacting occupational performance description: CVI, CAD, hx  non-pressure related leg ulcer    Modification or Assistance to Complete Evaluation  Min-Moderate modification of tasks or assist with assess necessary to complete eval    OT Frequency 2x / week    OT Duration 12 weeks   follow aong and PRN for garments   OT Treatment/Interventions Self-care/ADL training;DME  and/or AE instruction;Manual lymph drainage;Compression bandaging;Therapeutic activities;Therapeutic exercise;Scar mobilization;Other (comment);Manual Therapy;Patient/family education   skin care with low ph lotion and/ or castor oil to imrove tissue hydration and mobility essential for lymphatic drainage   Plan Complete Decongestive Therapy (CDT) to sigle limb a a time to limit fall risk and impact on function. Pt and CG will be taught to apply multilayer gradient compression wraps below knee to base of toes. CDT also to include manual lymphatic drainage, skin care, therapeutic exercise and fitting with appropriate knee length compression garments that are comfortable and Pt is able to don and doff using assistive devices PRN.    Recommended Other Services Consider fitting Pt with advanced sequential pneumatic compression device PRN.    Consulted and Agree with Plan of Care Patient;Family member/caregiver    Family Member Consulted daughter, [name]           Patient will benefit from skilled therapeutic intervention in order to improve the following deficits and impairments:   Body Structure / Function / Physical Skills: ADL,Decreased knowledge of use of DME,Balance,Pain,Edema,Endurance,IADL,Scar mobility,Mobility,Decreased knowledge of precautions,Skin integrity       Visit Diagnosis: Lymphedema, not elsewhere classified    Problem List Patient Active Problem List   Diagnosis Date Noted  . Rectal irritation 11/25/2020  . Lymphedema 11/19/2020  . Pressure injury of right buttock, stage 2 ([place]) 04/23/2020  . Acute exacerbation of CHF (congestive heart failure) ([place]) 01/13/2020  .  Lower limb ulcer, calf, left, limited to breakdown of skin ([place]) 12/27/2019  . Dysuria 06/30/2019  . Hyperglycemia 06/30/2019  . LVH (left ventricular hypertrophy) due to hypertensive disease, without heart failure 05/26/2019  . Pseudophakia, left eye 11/26/2018  . Venous insufficiency of both lower extremities 10/13/2018  . Swelling of limb 10/12/2018  . Bilateral lower extremity edema 10/10/2018  . Absent pulse in lower extremity 10/10/2018  . 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 Eye pain 05/31/2018  . Mild aortic valve stenosis 12/03/2017  . Epididymoorchitis 09/02/2017  . Acute cystitis with hematuria 08/11/2017  . Hydrocele, right 08/11/2017  . Aortic aneurysm ([place]) 03/28/2017  . Abnormal chest CT 03/28/2017  . Hypotension   . Dizziness 12/22/2016  . Leg weakness 03/11/2016  . Skin lesion 09/11/2015  . Moderate mitral insufficiency 05/18/2015  . Moderate tricuspid insufficiency 05/18/2015  . Benign essential hypertension 04/25/2015  . Rash 03/10/2015  . Health care maintenance 03/10/2015  . Bilateral carotid artery stenosis 08/30/2014  . Atherosclerosis of both carotid arteries 07/17/2014  . Chronic systolic CHF (congestive heart failure), NYHA class 3 ([place]) 07/17/2014  . CAD (coronary artery disease) 06/18/2014  . CHF (congestive heart failure) ([place]) 04/20/2014  . Congestive heart failure ([place]) 04/20/2014  . SOB (shortness of breath) 03/10/2014  . Abnormal EKG 03/10/2014  . Mixed hyperlipidemia 03/10/2014  . Benign lipomatous neoplasm of skin and subcutaneous tissue of left arm 10/04/2013  . Arm skin lesion, left 09/04/2013  . History of shingles 09/04/2013  . History of bladder cancer 03/05/2013  . Chronic prostatitis 09/30/2012  . Elevated prostate specific antigen (PSA) 09/30/2012  . Family history of malignant neoplasm of prostate 09/30/2012  . Gross hematuria 09/30/2012  . Incomplete emptying of bladder 09/30/2012  . Anemia 08/30/2012  . Eaton-Lambert  myasthenic syndrome ([place]) 08/30/2012  . Hypercholesterolemia 08/30/2012  . Herpes zoster 11/28/2011  . Hypertension, benign 04/02/2011  . Myasthenia gravis without exacerbation ([place]) 03/31/2011    Andrey Spearman, MS, OTR/L, CLT-LANA 02/11/21 4:38 PM   [facility]  [place] MAIN [facility] SERVICES [place], [place], [address] Phone: [phone]   Fax:  [phone]  Name: [name] MRN: [id number] Date of Birth: 07-11-1926

## 2021-02-13 ENCOUNTER — Other Ambulatory Visit: Payer: Self-pay

## 2021-02-13 ENCOUNTER — Ambulatory Visit: Payer: Medicare Other | Admitting: Occupational Therapy

## 2021-02-13 DIAGNOSIS — I890 Lymphedema, not elsewhere classified: Secondary | ICD-10-CM | POA: Diagnosis not present

## 2021-02-13 NOTE — Therapy (Signed)
[name] MAIN [name] SERVICES [address] [place], [place], [address] Phone: [phone]   Fax:  [phone]  Occupational Therapy Treatment  Patient Details  Name: [name] MRN: [id number] Date of Birth: [birth year] Referring Provider (OT): Joel Sans, MD   Encounter Date: 02/13/2021   OT End of Session - 02/13/21 1113    Visit Number 13    Number of Visits 36    Date for OT Re-Evaluation 03/31/21    OT Start Time 1110    OT Stop Time 1210    OT Time Calculation (min) 60 min    Equipment Utilized During Treatment mupirocin ointment (bactroban), mometasone cream (elocon)- both brought from dermatologist.    Activity Tolerance Patient tolerated treatment well;No increased pain    Behavior During Therapy WFL for tasks assessed/performed           Past Medical History:  Diagnosis Date  . Abnormal chest CT 03/28/2017  . Allergy   . Anemia   . Angina pectoris (HCC) 04/02/2011  . Aortic aneurysm (HCC) 03/28/2017   Saw [name] 04/16/17 - recommended f/u CT in 3 months.    . Atherosclerosis of both carotid arteries 07/17/2014  . Benign lipomatous neoplasm of skin and subcutaneous tissue of left arm 10/04/2013  . Bilateral carotid artery stenosis 08/30/2014   Overview:  Less than 50% 2015  . Bladder cancer (HCC) 03/05/2013  . CAD (coronary artery disease) 06/18/2014  . Cancer (HCC) 11-24-12   bladder. BCG treatments by [name].  . Chicken pox   . Chronic prostatitis 09/30/2012  . Colon polyp   . Congestive heart failure (HCC) 04/20/2014   Overview:  Overview:  With anterior mi and moderate lv dyfunction ef 35%  . Dizziness 12/22/2016  . 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 Eaton-Lambert syndrome (HCC)   . Elevated prostate specific antigen (PSA) 09/30/2012  . Essential hypertension, benign   . Gross hematuria 09/30/2012  . Herpes zoster 11/28/2011   Overview:  with ocular involvement OD  . Hypercholesterolemia   .  Hypertension, benign 04/02/2011  . Hypotension   . Moderate mitral insufficiency 05/18/2015  . Moderate tricuspid insufficiency 05/18/2015  . Myasthenia gravis (HCC)   . Myasthenia gravis without exacerbation (HCC) 03/31/2011  . Shingles 2013  . Squamous cell carcinoma of skin 11/22/2015   Left cheek. WD SCC. Re-shaved 01/14/2016. Excised 02/05/2016, margins free.  Marland Kitchen Squamous cell carcinoma of skin 11/03/2018   Right cheek ant. to sideburn. Poorly differentiated.  . Squamous cell carcinoma of skin 02/02/2019   Right mid dorsum forearm. WD SCC. EDC.    Past Surgical History:  Procedure Laterality Date  . APPENDECTOMY  1958  . CHOLECYSTECTOMY  1979  . HERNIA REPAIR  1970  . TONSILLECTOMY  1958    There were no vitals filed for this visit.   Subjective Assessment - 02/13/21 1113    Subjective  [name] presents for OT Rx visit 13/36 to address BLE lymphedema, L>R. He is unaccompanied today. He brings existing , OTS compression socks with zippers in them for assessment. Garments are inadequate for containing current level of swelling.    Patient is accompanied by: Family member   daughter, [name]   Pertinent History chronic BLE lymphedema (LE) and associated pain/ discomfort. Altered sensation/numbness LLE, Hx lymphorrhea, hx non-pressure related leg ulcer, stasis dermatitis, AORTIC ANEURISM, BILATERAL CAROTID STENOSIS, BLADDER CANCER 03/05/13, CAD, CHF. htn, s/p squamous cell carcinoma 2020 x 2, 20117  Limitations decreased endurance, decreased balance, chronic leg swelling w paihn and hx skin ulcer    Repetition Increases Symptoms    Special Tests +Stemmer bilaterally, FOTO TBA Rx visit 1    Patient Stated Goals "get this swelling down in about 2 weeks"    Pain Onset Other (comment)   insideous onset ~ 5 yrs ago                       OT Treatments/Exercises (OP) - 02/13/21 1619      ADLs   ADL Education Given Yes      Manual Therapy   Manual Therapy Edema  management;Compression Bandaging    Manual therapy comments anatomical measurements for OTS RLE compression knee high    Edema Management skin care with elocon cream prior to wrapping.    Manual Lymphatic Drainage (MLD) MLD to LLE/LLQ utilizing short neck sequence ( CLAVICULAR LN ONLY in keeping w CAROTID precautions), deep abdominal pathway, functional inguinal LN and J strokes from proximal to disial to thigh, leg and dorsal foot. Good tolerance.    Compression Bandaging knee length. short stretch compression wraps applied to RLE only as established in keeping with Joel's recommendation to limit infection progression and facilitate wound healing.                  OT Education - 02/13/21 1315    Education Details Continued skilled Pt/caregiver education  And LE ADL training throughout visit for lymphedema self care/ home program, including compression wrapping, compression garment and device wear/care, lymphatic pumping ther ex, simple self-MLD, and skin care. Discussed progress towards goals.    Person(s) Educated Patient    Methods Explanation;Demonstration;Handout    Comprehension Verbalized understanding;Returned demonstration;Need further instruction               OT Long Term Goals - 02/04/21 1253      OT LONG TERM GOAL #1   Title Given this patient's risk-adjustment variables, and is Intake Functional Status score of /100 on the FOTO tool, patient will experience at least an increase in function of 5 points, or higher.    Baseline TBA    Time 12    Period Weeks    Status On-going    Target Date 03/31/21      OT LONG TERM GOAL #2   Title Pt will be able to apply knee length, multi-layer, short stretch compression wraps to one leg using correct gradient techniques with extra time (modified independence)  decrease limb volume, to decrease leg pain and limit infection risk, and to improve safe functional ambulation and mobility.    Baseline max a    Time 4    Period Days     Status Partially Met   Max A from caregiver required     OT LONG TERM GOAL #3   Title Patient will demonstrate understanding of LE precautions, prevention strategies and cellulitis signs and symptoms by verbalizing at least 4 examples using  printed reference( modified independence)  to limit infection risk, recurrent wounds and LE progression.    Baseline Max A    Time 4    Period Days    Status Achieved      OT LONG TERM GOAL #4   Title Pt will achieve 5%, or greater,  limb volume reduction on the R below the knee, and 10% reduction on the L below the knee (ankle to tibial tuberosity = A-D) during Intensive Phase CDT to achieve  optimal limb volume reduction and to prevent re-accumulation of lymphatic congestion and fibrosis in the legs, to limit infection risk, to improve functional ambulation and transfers, to improve functional performance of basic and instrumental ADLs, and to limit LE progression.    Baseline Max A    Time 12    Period Weeks    Status On-going   TBA next visit. . Visibble reduction in LLE appears > 5%. RLE swelling is significantly increased today due to suspected cellulitis.     OT LONG TERM GOAL #5   Title Pt will demonstrate and sustain a least 85% compliance performing all daily LE self-care home program components daily throughout Intensive Phase CDT, including recommended skin care regime, lymphatic pumping ther ex, 23/7 compression wraps and simple self-MLD, to ensure optimal limb volume reduction, to limit infection risk and to limit LE progression.    Baseline Max A    Time 12    Period Weeks    Status Partially Met   Pt has had difficulty with compliance with compression wraps to date. He has not been able to apply wraps himself. A very helpful friend/ neighbor learned to apply wraps at last visit. She did apply them this past weekend   Target Date 03/31/21      OT LONG TERM GOAL #6   Title Using assistive devices and additional time  (modified  independence)  Pt will be able to don and doff appropriate daytime compression garments and HOS devices to limit lymphatic re-accumulation and LE progression with before transitioning to self-management phase of CDT.    Baseline Max A    Time 12    Period Weeks   issue date   Status On-going                 Plan - 02/13/21 1620    Clinical Impression Statement Open areas on R leg are closed, and redness is further reduced today, but still remains more red than when we commenced CDT. Skin temperature to palpation is WNL. Pt tolerated MLD, skin care and compression wraps to RLE without increased pain. Tenderness at L tibial  area remains , but is les intense today by report. Applied medication  to reddened areas of R leg as sirected by referring MD prior to reapplying wraps. Completed measurements for RLE OTS compression knee high and recommended Pt purchase at 1 of 4 online outlets. Speced out JUZO SOFT 2002 , ccl 2 (30-40 mmhg) size IV, SHORT, open toe + silicone top band stockings.  Provided 4 online merchants for garments besides [employer]. Will fit RLE garment ASAP and then resume care  for LLE. LLE has regained  pre-treatment limb volume.  Cont as per POC.    OT Occupational Profile and History Comprehensive Assessment- Review of records and extensive additional review of physical, cognitive, psychosocial history related to current functional performance    Occupational performance deficits (Please refer to evaluation for details): ADL's;IADL's;Work;Leisure;Social Participation    Body Structure / Function / Physical Skills ADL;Decreased knowledge of use of DME;Balance;Pain;Edema;Endurance;IADL;Scar mobility;Mobility;Decreased knowledge of precautions;Skin integrity    Rehab Potential Good    Clinical Decision Making Several treatment options, min-mod task modification necessary    Comorbidities Affecting Occupational Performance: Presence of comorbidities impacting occupational performance     Comorbidities impacting occupational performance description: CVI, CAD, hx non-pressure related leg ulcer    Modification or Assistance to Complete Evaluation  Min-Moderate modification of tasks or assist with assess necessary to complete eval  OT Frequency 2x / week    OT Duration 12 weeks    OT Treatment/Interventions Self-care/ADL training;DME and/or AE instruction;Manual lymph drainage;Compression bandaging;Therapeutic activities;Therapeutic exercise;Scar mobilization;Other (comment);Manual Therapy;Patient/family education    Plan Complete Decongestive Therapy (CDT) to sigle limb a a time to limit fall risk and impact on function. Pt and CG will be taught to apply multilayer gradient compression wraps below knee to base of toes. CDT also to include manual lymphatic drainage, skin care, therapeutic exercise and fitting with appropriate knee length compression garments that are comfortable and Pt is able to don and doff using assistive devices PRN.    Recommended Other Services Consider fitting Pt with advanced sequential pneumatic compression device PRN.    Consulted and Agree with Plan of Care Patient;Family member/caregiver    Family Member Consulted daughter, [name]           Patient will benefit from skilled therapeutic intervention in order to improve the following deficits and impairments:   Body Structure / Function / Physical Skills: ADL,Decreased knowledge of use of DME,Balance,Pain,Edema,Endurance,IADL,Scar mobility,Mobility,Decreased knowledge of precautions,Skin integrity       Visit Diagnosis: Lymphedema, not elsewhere classified    Problem List Patient Active Problem List   Diagnosis Date Noted  . Rectal irritation 11/25/2020  . Lymphedema 11/19/2020  . Pressure injury of right buttock, stage 2 ([place]) 04/23/2020  . Acute exacerbation of CHF (congestive heart failure) ([place]) 01/13/2020  . Lower limb ulcer, calf, left, limited to breakdown of skin ([place]) 12/27/2019  .  Dysuria 06/30/2019  . Hyperglycemia 06/30/2019  . LVH (left ventricular hypertrophy) due to hypertensive disease, without heart failure 05/26/2019  . Pseudophakia, left eye 11/26/2018  . Venous insufficiency of both lower extremities 10/13/2018  . Swelling of limb 10/12/2018  . Bilateral lower extremity edema 10/10/2018  . Absent pulse in lower extremity 10/10/2018  . 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 Eye pain 05/31/2018  . Mild aortic valve stenosis 12/03/2017  . Epididymoorchitis 09/02/2017  . Acute cystitis with hematuria 08/11/2017  . Hydrocele, right 08/11/2017  . Aortic aneurysm ([place]) 03/28/2017  . Abnormal chest CT 03/28/2017  . Hypotension   . Dizziness 12/22/2016  . Leg weakness 03/11/2016  . Skin lesion 09/11/2015  . Moderate mitral insufficiency 05/18/2015  . Moderate tricuspid insufficiency 05/18/2015  . Benign essential hypertension 04/25/2015  . Rash 03/10/2015  . Health care maintenance 03/10/2015  . Bilateral carotid artery stenosis 08/30/2014  . Atherosclerosis of both carotid arteries 07/17/2014  . Chronic systolic CHF (congestive heart failure), NYHA class 3 ([place]) 07/17/2014  . CAD (coronary artery disease) 06/18/2014  . CHF (congestive heart failure) ([place]) 04/20/2014  . Congestive heart failure ([place]) 04/20/2014  . SOB (shortness of breath) 03/10/2014  . Abnormal EKG 03/10/2014  . Mixed hyperlipidemia 03/10/2014  . Benign lipomatous neoplasm of skin and subcutaneous tissue of left arm 10/04/2013  . Arm skin lesion, left 09/04/2013  . History of shingles 09/04/2013  . History of bladder cancer 03/05/2013  . Chronic prostatitis 09/30/2012  . Elevated prostate specific antigen (PSA) 09/30/2012  . Family history of malignant neoplasm of prostate 09/30/2012  . Gross hematuria 09/30/2012  . Incomplete emptying of bladder 09/30/2012  . Anemia 08/30/2012  . 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[place]) 08/30/2012  . Hypercholesterolemia 08/30/2012  .  Herpes zoster 11/28/2011  . Hypertension, benign 04/02/2011  . Myasthenia gravis without exacerbation ([place]) 03/31/2011    Andrey Spearman, MS, OTR/L, CLT-LANA 02/13/21 4:23 PM  [name] MAIN  [place], [place], [address] Phone: [phone]   Fax:  [phone]  Name: [name] MRN: [id number] Date of Birth: 02/22/1926

## 2021-02-15 ENCOUNTER — Ambulatory Visit: Payer: Medicare Other | Admitting: Occupational Therapy

## 2021-02-18 ENCOUNTER — Ambulatory Visit: Payer: Medicare Other | Admitting: Occupational Therapy

## 2021-02-18 ENCOUNTER — Other Ambulatory Visit: Payer: Self-pay

## 2021-02-18 DIAGNOSIS — I890 Lymphedema, not elsewhere classified: Secondary | ICD-10-CM

## 2021-02-18 NOTE — Therapy (Signed)
[place] MAIN [facility] SERVICES [address] [place], [place], [address] Phone: [phone]   Fax:  [phone]  Occupational Therapy Treatment  Patient Details  Name: [name] MRN: [id number] Date of Birth: 02-01-1926 Referring Provider (OT): [name]   Encounter Date: 02/18/2021   OT End of Session - 02/18/21 1115    Visit Number 14    Number of Visits 36    Date for OT Re-Evaluation 03/31/21    OT Start Time 1108    OT Stop Time 1213    OT Time Calculation (min) 65 min    Equipment Utilized During Treatment mupirocin ointment (bactroban), mometasone cream (elocon)- both brought from dermatologist.    Activity Tolerance Patient tolerated treatment well;No increased pain    Behavior During Therapy WFL for tasks assessed/performed           Past Medical History:  Diagnosis Date  . Abnormal chest CT 03/28/2017  . Allergy   . Anemia   . Angina pectoris ([place]) 04/02/2011  . Aortic aneurysm ([place]) 03/28/2017   Saw [name] 04/16/17 - recommended f/u CT in 3 months.    . Atherosclerosis of both carotid arteries 07/17/2014  . Benign lipomatous neoplasm of skin and subcutaneous tissue of left arm 10/04/2013  . Bilateral carotid artery stenosis 08/30/2014   Overview:  Less than 50% 2015  . Bladder cancer ([place]) 03/05/2013  . CAD (coronary artery disease) 06/18/2014  . Cancer ([place]) 11-24-12   bladder. BCG treatments by Joel [name].  . Chicken pox   . Chronic prostatitis 09/30/2012  . Colon polyp   . Congestive heart failure ([place]) 04/20/2014   Overview:  Overview:  With anterior mi and moderate lv dyfunction ef 35%  . Dizziness 12/22/2016  . 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 Eaton-Lambert syndrome ([place])   . Elevated prostate specific antigen (PSA) 09/30/2012  . Essential hypertension, benign   . Gross hematuria 09/30/2012  . Herpes zoster 11/28/2011   Overview:  with ocular involvement OD  . Hypercholesterolemia   .  Hypertension, benign 04/02/2011  . Hypotension   . Moderate mitral insufficiency 05/18/2015  . Moderate tricuspid insufficiency 05/18/2015  . Myasthenia gravis ([place])   . Myasthenia gravis without exacerbation ([place]) 03/31/2011  . Shingles 2013  . Squamous cell carcinoma of skin 11/22/2015   Left cheek. WD SCC. Re-shaved 01/14/2016. Excised 02/05/2016, margins free.  Marland Kitchen Squamous cell carcinoma of skin 11/03/2018   Right cheek ant. to sideburn. Poorly differentiated.  . Squamous cell carcinoma of skin 02/02/2019   Right mid dorsum forearm. WD SCC. EDC.    Past Surgical History:  Procedure Laterality Date  . APPENDECTOMY  1958  . CHOLECYSTECTOMY  1979  . HERNIA REPAIR  1970  . TONSILLECTOMY  1958    There were no vitals filed for this visit.   Subjective Assessment - 02/18/21 1116    Subjective  [name] presents for OT Rx visit 13/36 to address BLE lymphedema, L>R. He is unaccompanied today. He brings compression wraps to clinic. He denies leg pain. RLE swelling is significantly decreased and seeminly stable, but LLE, the limb   we started CDT on, is noteably swollen, reddened, and shiney with skin stretched very tight. Pt reports he is nearly done with antibiotic perscription issued for the RLE. Pt agrees to "keep an eye on" te LLE for signs/ symptoms of infection once oral meds are com-leted.    Patient is accompanied by: Family member  daughter, [name]   Pertinent History chronic BLE lymphedema (LE) and associated pain/ discomfort. Altered sensation/numbness LLE, Hx lymphorrhea, hx non-pressure related leg ulcer, stasis dermatitis, AORTIC ANEURISM, BILATERAL CAROTID STENOSIS, BLADDER CANCER 03/05/13, CAD, CHF. htn, s/p squamous cell carcinoma 2020 x 2, 20117    Limitations decreased endurance, decreased balance, chronic leg swelling w paihn and hx skin ulcer    Repetition Increases Symptoms    Special Tests +Stemmer bilaterally, FOTO TBA Rx visit 1    Patient Stated Goals "get this  swelling down in about 2 weeks"    Pain Onset Other (comment)   insideous onset ~ 5 yrs ago                       OT Treatments/Exercises (OP) - 02/18/21 1230      ADLs   ADL Education Given Yes      Manual Therapy   Manual Therapy Edema management;Compression Bandaging    Edema Management skin care to LLE with low ph castor oil  during MLD, and with Eucerin, low ph lotion, on the RLE below the knee to increase skin hydration and limit infection risk    Manual Lymphatic Drainage (MLD) MLD to LLE/LLQ utilizing short neck sequence ( CLAVICULAR LN ONLY in keeping w CAROTID precautions), deep abdominal pathway, functional inguinal LN and J strokes from proximal to disial to thigh, leg and dorsal foot. Good tolerance.    Compression Bandaging knee length. short stretch compression wraps applied to LLE using 8.10 and 12 cm wide short stretch wraps in gradient configuration over stockinett and 1 layer of Rosidal 0.4 cm thick foam. Stockinett and single layer of foam from distal ankle to popliteal on the L with loaned  adjustable, Velcro style leg wrap to the RLE.                  OT Education - 02/18/21 1230    Education Details Continued skilled Pt/caregiver education  And LE ADL training throughout visit for lymphedema self care/ home program, including compression wrapping, compression garment and device wear/care, lymphatic pumping ther ex, simple self-MLD, and skin care. Discussed progress towards goals.    Person(s) Educated Patient    Methods Explanation;Demonstration;Handout    Comprehension Verbalized understanding;Returned demonstration;Need further instruction               OT Long Term Goals - 02/04/21 1253      OT LONG TERM GOAL #1   Title Given this patient's risk-adjustment variables, and is Intake Functional Status score of /100 on the FOTO tool, patient will experience at least an increase in function of 5 points, or higher.    Baseline TBA    Time  12    Period Weeks    Status On-going    Target Date 03/31/21      OT LONG TERM GOAL #2   Title Pt will be able to apply knee length, multi-layer, short stretch compression wraps to one leg using correct gradient techniques with extra time (modified independence)  decrease limb volume, to decrease leg pain and limit infection risk, and to improve safe functional ambulation and mobility.    Baseline max a    Time 4    Period Days    Status Partially Met   Max A from caregiver required     OT LONG TERM GOAL #3   Title Patient will demonstrate understanding of LE precautions, prevention strategies and cellulitis signs and symptoms by verbalizing  at least 4 examples using  printed reference( modified independence)  to limit infection risk, recurrent wounds and LE progression.    Baseline Max A    Time 4    Period Days    Status Achieved      OT LONG TERM GOAL #4   Title Pt will achieve 5%, or greater,  limb volume reduction on the R below the knee, and 10% reduction on the L below the knee (ankle to tibial tuberosity = A-D) during Intensive Phase CDT to achieve optimal limb volume reduction and to prevent re-accumulation of lymphatic congestion and fibrosis in the legs, to limit infection risk, to improve functional ambulation and transfers, to improve functional performance of basic and instrumental ADLs, and to limit LE progression.    Baseline Max A    Time 12    Period Weeks    Status On-going   TBA next visit. . Visibble reduction in LLE appears > 5%. RLE swelling is significantly increased today due to suspected cellulitis.     OT LONG TERM GOAL #5   Title Pt will demonstrate and sustain a least 85% compliance performing all daily LE self-care home program components daily throughout Intensive Phase CDT, including recommended skin care regime, lymphatic pumping ther ex, 23/7 compression wraps and simple self-MLD, to ensure optimal limb volume reduction, to limit infection risk and to  limit LE progression.    Baseline Max A    Time 12    Period Weeks    Status Partially Met   Pt has had difficulty with compliance with compression wraps to date. He has not been able to apply wraps himself. A very helpful friend/ neighbor learned to apply wraps at last visit. She did apply them this past weekend   Target Date 03/31/21      OT LONG TERM GOAL #6   Title Using assistive devices and additional time  (modified independence)  Pt will be able to don and doff appropriate daytime compression garments and HOS devices to limit lymphatic re-accumulation and LE progression with before transitioning to self-management phase of CDT.    Baseline Max A    Time 12    Period Weeks   issue date   Status On-going                  Patient will benefit from skilled therapeutic intervention in order to improve the following deficits and impairments:           Visit Diagnosis: Lymphedema, not elsewhere classified    Problem List Patient Active Problem List   Diagnosis Date Noted  . Rectal irritation 11/25/2020  . Lymphedema 11/19/2020  . Pressure injury of right buttock, stage 2 ([place]) 04/23/2020  . Acute exacerbation of CHF (congestive heart failure) ([place]) 01/13/2020  . Lower limb ulcer, calf, left, limited to breakdown of skin ([place]) 12/27/2019  . Dysuria 06/30/2019  . Hyperglycemia 06/30/2019  . LVH (left ventricular hypertrophy) due to hypertensive disease, without heart failure 05/26/2019  . Pseudophakia, left eye 11/26/2018  . Venous insufficiency of both lower extremities 10/13/2018  . Swelling of limb 10/12/2018  . Bilateral lower extremity edema 10/10/2018  . Absent pulse in lower extremity 10/10/2018  . 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 Eye pain 05/31/2018  . Mild aortic valve stenosis 12/03/2017  . Epididymoorchitis 09/02/2017  . Acute cystitis with hematuria 08/11/2017  . Hydrocele, right 08/11/2017  . Aortic aneurysm ([place]) 03/28/2017  .  Abnormal chest CT 03/28/2017  .  Hypotension   . Dizziness 12/22/2016  . Leg weakness 03/11/2016  . Skin lesion 09/11/2015  . Moderate mitral insufficiency 05/18/2015  . Moderate tricuspid insufficiency 05/18/2015  . Benign essential hypertension 04/25/2015  . Rash 03/10/2015  . Health care maintenance 03/10/2015  . Bilateral carotid artery stenosis 08/30/2014  . Atherosclerosis of both carotid arteries 07/17/2014  . Chronic systolic CHF (congestive heart failure), NYHA class 3 ([place]) 07/17/2014  . CAD (coronary artery disease) 06/18/2014  . CHF (congestive heart failure) ([place]) 04/20/2014  . Congestive heart failure ([place]) 04/20/2014  . SOB (shortness of breath) 03/10/2014  . Abnormal EKG 03/10/2014  . Mixed hyperlipidemia 03/10/2014  . Benign lipomatous neoplasm of skin and subcutaneous tissue of left arm 10/04/2013  . Arm skin lesion, left 09/04/2013  . History of shingles 09/04/2013  . History of bladder cancer 03/05/2013  . Chronic prostatitis 09/30/2012  . Elevated prostate specific antigen (PSA) 09/30/2012  . Family history of malignant neoplasm of prostate 09/30/2012  . Gross hematuria 09/30/2012  . Incomplete emptying of bladder 09/30/2012  . Anemia 08/30/2012  . 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 Hypercholesterolemia 08/30/2012  . Herpes zoster 11/28/2011  . Hypertension, benign 04/02/2011  . Myasthenia gravis without exacerbation ([place]) 03/31/2011    Andrey Spearman, MS, OTR/L, [facility] 02/18/21 12:46 PM  [place] MAIN [facility] SERVICES [address] [place], [place], [address] Phone: [phone]   Fax:  [phone]  Name: [name] MRN: [id number] Date of Birth: Oct 15, 1925

## 2021-02-20 ENCOUNTER — Ambulatory Visit: Payer: Medicare Other | Admitting: Occupational Therapy

## 2021-02-20 ENCOUNTER — Other Ambulatory Visit: Payer: Self-pay

## 2021-02-20 DIAGNOSIS — I890 Lymphedema, not elsewhere classified: Secondary | ICD-10-CM | POA: Diagnosis not present

## 2021-02-20 NOTE — Therapy (Signed)
[place] MAIN [facility] SERVICES [address] [place], [place], [address] Phone: [phone]   Fax:  [phone]  Occupational Therapy Treatment  Patient Details  Name: [name] MRN: [id number] Date of Birth: 04-24-1926 Referring Provider (OT): [name], MD   Encounter Date: 02/20/2021   OT End of Session - 02/20/21 1236    Visit Number 15    Number of Visits 36    Date for OT Re-Evaluation 03/31/21    OT Start Time 1104    OT Stop Time 1210    OT Time Calculation (min) 66 min    Equipment Utilized During Treatment mupirocin ointment (bactroban), mometasone cream (elocon)- both brought from dermatologist.    Activity Tolerance Patient tolerated treatment well;No increased pain    Behavior During Therapy WFL for tasks assessed/performed           Past Medical History:  Diagnosis Date  . Abnormal chest CT 03/28/2017  . Allergy   . Anemia   . Angina pectoris ([place]) 04/02/2011  . Aortic aneurysm ([place]) 03/28/2017   Saw Dr [name] 04/16/17 - recommended f/u CT in 3 months.    . Atherosclerosis of both carotid arteries 07/17/2014  . Benign lipomatous neoplasm of skin and subcutaneous tissue of left arm 10/04/2013  . Bilateral carotid artery stenosis 08/30/2014   Overview:  Less than 50% 2015  . Bladder cancer ([place]) 03/05/2013  . CAD (coronary artery disease) 06/18/2014  . Cancer ([place]) 11-24-12   bladder. BCG treatments by Dr [name].  . Chicken pox   . Chronic prostatitis 09/30/2012  . Colon polyp   . Congestive heart failure ([place]) 04/20/2014   Overview:  Overview:  With anterior mi and moderate lv dyfunction ef 35%  . Dizziness 12/22/2016  . 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 Eaton-Lambert syndrome ([place])   . Elevated prostate specific antigen (PSA) 09/30/2012  . Essential hypertension, benign   . Gross hematuria 09/30/2012  . Herpes zoster 11/28/2011   Overview:  with ocular involvement OD  . Hypercholesterolemia   .  Hypertension, benign 04/02/2011  . Hypotension   . Moderate mitral insufficiency 05/18/2015  . Moderate tricuspid insufficiency 05/18/2015  . Myasthenia gravis ([place])   . Myasthenia gravis without exacerbation ([place]) 03/31/2011  . Shingles 2013  . Squamous cell carcinoma of skin 11/22/2015   Left cheek. WD SCC. Re-shaved 01/14/2016. Excised 02/05/2016, margins free.  Marland Kitchen Squamous cell carcinoma of skin 11/03/2018   Right cheek ant. to sideburn. Poorly differentiated.  . Squamous cell carcinoma of skin 02/02/2019   Right mid dorsum forearm. WD SCC. EDC.    Past Surgical History:  Procedure Laterality Date  . APPENDECTOMY  1958  . CHOLECYSTECTOMY  1979  . HERNIA REPAIR  1970  . TONSILLECTOMY  1958    There were no vitals filed for this visit.   Subjective Assessment - 02/20/21 1245    Subjective  [name] presents for OT Rx visit 15/36 to address BLE lymphedema, L>R. He is unaccompanied today. He brings compression wraps to clinic. he reports a "sore spot" at L mid tibia region, but does not rate pain numerically. Pt reports he finished his antibiotics this morning. He denies additional concerns.    Patient is accompanied by: Family member   daughter, [name]   Pertinent History chronic BLE lymphedema (LE) and associated pain/ discomfort. Altered sensation/numbness LLE, Hx lymphorrhea, hx non-pressure related leg ulcer, stasis dermatitis, AORTIC ANEURISM, BILATERAL CAROTID STENOSIS, BLADDER CANCER 03/05/13, CAD,  CHF. htn, s/p squamous cell carcinoma 2020 x 2, 20117    Limitations decreased endurance, decreased balance, chronic leg swelling w paihn and hx skin ulcer    Repetition Increases Symptoms    Special Tests +Stemmer bilaterally, FOTO TBA Rx visit 1    Patient Stated Goals "get this swelling down in about 2 weeks"    Pain Onset Other (comment)   insideous onset ~ 5 yrs ago                       OT Treatments/Exercises (OP) - 02/20/21 1247      ADLs   ADL  Education Given Yes      Manual Therapy   Manual Therapy Edema management;Compression Bandaging    Edema Management skin care to LLE with low ph castor oil  during MLD, and with Eucerin, low ph lotion, on the RLE below the knee to increase skin hydration and limit infection risk    Manual Lymphatic Drainage (MLD) MLD to LLE/LLQ utilizing short neck sequence ( CLAVICULAR LN ONLY in keeping w CAROTID precautions), deep abdominal pathway, functional inguinal LN and J strokes from proximal to disial to thigh, leg and dorsal foot. Good tolerance.    Compression Bandaging knee length. short stretch compression wraps applied to LLE using 8.10 and 12 cm wide short stretch wraps in gradient configuration over stockinett and 1 layer of Rosidal 0.4 cm thick foam. Stockinett and single layer of foam from distal ankle to popliteal on the L with loaned  adjustable, Velcro style leg wrap to the RLE.                  OT Education - 02/20/21 1248    Education Details Reviewed signs and symptoms of cellulitis infection. Good return    Person(s) Educated Patient    Methods Explanation;Demonstration;Handout    Comprehension Verbalized understanding;Returned demonstration;Need further instruction               OT Long Term Goals - 02/04/21 1253      OT LONG TERM GOAL #1   Title Given this patient's risk-adjustment variables, and is Intake Functional Status score of /100 on the FOTO tool, patient will experience at least an increase in function of 5 points, or higher.    Baseline TBA    Time 12    Period Weeks    Status On-going    Target Date 03/31/21      OT LONG TERM GOAL #2   Title Pt will be able to apply knee length, multi-layer, short stretch compression wraps to one leg using correct gradient techniques with extra time (modified independence)  decrease limb volume, to decrease leg pain and limit infection risk, and to improve safe functional ambulation and mobility.    Baseline max a     Time 4    Period Days    Status Partially Met   Max A from caregiver required     OT LONG TERM GOAL #3   Title Patient will demonstrate understanding of LE precautions, prevention strategies and cellulitis signs and symptoms by verbalizing at least 4 examples using  printed reference( modified independence)  to limit infection risk, recurrent wounds and LE progression.    Baseline Max A    Time 4    Period Days    Status Achieved      OT LONG TERM GOAL #4   Title Pt will achieve 5%, or greater,  limb volume reduction on  the R below the knee, and 10% reduction on the L below the knee (ankle to tibial tuberosity = A-D) during Intensive Phase CDT to achieve optimal limb volume reduction and to prevent re-accumulation of lymphatic congestion and fibrosis in the legs, to limit infection risk, to improve functional ambulation and transfers, to improve functional performance of basic and instrumental ADLs, and to limit LE progression.    Baseline Max A    Time 12    Period Weeks    Status On-going   TBA next visit. . Visibble reduction in LLE appears > 5%. RLE swelling is significantly increased today due to suspected cellulitis.     OT LONG TERM GOAL #5   Title Pt will demonstrate and sustain a least 85% compliance performing all daily LE self-care home program components daily throughout Intensive Phase CDT, including recommended skin care regime, lymphatic pumping ther ex, 23/7 compression wraps and simple self-MLD, to ensure optimal limb volume reduction, to limit infection risk and to limit LE progression.    Baseline Max A    Time 12    Period Weeks    Status Partially Met   Pt has had difficulty with compliance with compression wraps to date. He has not been able to apply wraps himself. A very helpful friend/ neighbor learned to apply wraps at last visit. She did apply them this past weekend   Target Date 03/31/21      OT LONG TERM GOAL #6   Title Using assistive devices and additional  time  (modified independence)  Pt will be able to don and doff appropriate daytime compression garments and HOS devices to limit lymphatic re-accumulation and LE progression with before transitioning to self-management phase of CDT.    Baseline Max A    Time 12    Period Weeks   issue date   Status On-going                 Plan - 02/20/21 1237    Clinical Impression Statement Continued emphasis of CDT is to LLE today. RLE swelling remains well controlled, despite increased redness. Skin temp is WNL. LLE swelling has reduced by ~ 23 % by visual assessment today. Skin remains reddened , but slghtly less than 2 days ago. Skin is tightly stretched and slightly warm to touch, but not hot. Pt reports he completed antibiotic course this morning. OT Pt reports he sees his PCP this Friday and will be sure to report  LLE condition at his appointment, including concern for possibly pending cellulitis infection and sore area on pretibial area. Border around this sore area is somewhat visible but not obvious. Pt had mild "stinging" pain even when within 3-4 inches of it during gentle MLD. Cont as per POC.    OT Occupational Profile and History Comprehensive Assessment- Review of records and extensive additional review of physical, cognitive, psychosocial history related to current functional performance    Occupational performance deficits (Please refer to evaluation for details): ADL's;IADL's;Work;Leisure;Social Participation    Body Structure / Function / Physical Skills ADL;Decreased knowledge of use of DME;Balance;Pain;Edema;Endurance;IADL;Scar mobility;Mobility;Decreased knowledge of precautions;Skin integrity    Rehab Potential Good    Clinical Decision Making Several treatment options, min-mod task modification necessary    Comorbidities Affecting Occupational Performance: Presence of comorbidities impacting occupational performance    Comorbidities impacting occupational performance description:  CVI, CAD, hx non-pressure related leg ulcer    Modification or Assistance to Complete Evaluation  Min-Moderate modification of tasks or assist with  assess necessary to complete eval    OT Frequency 2x / week    OT Duration 12 weeks    OT Treatment/Interventions Self-care/ADL training;DME and/or AE instruction;Manual lymph drainage;Compression bandaging;Therapeutic activities;Therapeutic exercise;Scar mobilization;Other (comment);Manual Therapy;Patient/family education    Plan Complete Decongestive Therapy (CDT) to sigle limb a a time to limit fall risk and impact on function. Pt and CG will be taught to apply multilayer gradient compression wraps below knee to base of toes. CDT also to include manual lymphatic drainage, skin care, therapeutic exercise and fitting with appropriate knee length compression garments that are comfortable and Pt is able to don and doff using assistive devices PRN.    Recommended Other Services Consider fitting Pt with advanced sequential pneumatic compression device PRN.    Consulted and Agree with Plan of Care Patient;Family member/caregiver    Family Member Consulted daughter, [name]           Patient will benefit from skilled therapeutic intervention in order to improve the following deficits and impairments:   Body Structure / Function / Physical Skills: ADL,Decreased knowledge of use of DME,Balance,Pain,Edema,Endurance,IADL,Scar mobility,Mobility,Decreased knowledge of precautions,Skin integrity       Visit Diagnosis: Lymphedema, not elsewhere classified    Problem List Patient Active Problem List   Diagnosis Date Noted  . Rectal irritation 11/25/2020  . Lymphedema 11/19/2020  . Pressure injury of right buttock, stage 2 ([place]) 04/23/2020  . Acute exacerbation of CHF (congestive heart failure) ([place]) 01/13/2020  . Lower limb ulcer, calf, left, limited to breakdown of skin ([place]) 12/27/2019  . Dysuria 06/30/2019  . Hyperglycemia 06/30/2019  . LVH (left  ventricular hypertrophy) due to hypertensive disease, without heart failure 05/26/2019  . Pseudophakia, left eye 11/26/2018  . Venous insufficiency of both lower extremities 10/13/2018  . Swelling of limb 10/12/2018  . Bilateral lower extremity edema 10/10/2018  . Absent pulse in lower extremity 10/10/2018  . 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 Eye pain 05/31/2018  . Mild aortic valve stenosis 12/03/2017  . Epididymoorchitis 09/02/2017  . Acute cystitis with hematuria 08/11/2017  . Hydrocele, right 08/11/2017  . Aortic aneurysm ([place]) 03/28/2017  . Abnormal chest CT 03/28/2017  . Hypotension   . Dizziness 12/22/2016  . Leg weakness 03/11/2016  . Skin lesion 09/11/2015  . Moderate mitral insufficiency 05/18/2015  . Moderate tricuspid insufficiency 05/18/2015  . Benign essential hypertension 04/25/2015  . Rash 03/10/2015  . Health care maintenance 03/10/2015  . Bilateral carotid artery stenosis 08/30/2014  . Atherosclerosis of both carotid arteries 07/17/2014  . Chronic systolic CHF (congestive heart failure), NYHA class 3 ([place]) 07/17/2014  . CAD (coronary artery disease) 06/18/2014  . CHF (congestive heart failure) ([place]) 04/20/2014  . Congestive heart failure ([place]) 04/20/2014  . SOB (shortness of breath) 03/10/2014  . Abnormal EKG 03/10/2014  . Mixed hyperlipidemia 03/10/2014  . Benign lipomatous neoplasm of skin and subcutaneous tissue of left arm 10/04/2013  . Arm skin lesion, left 09/04/2013  . History of shingles 09/04/2013  . History of bladder cancer 03/05/2013  . Chronic prostatitis 09/30/2012  . Elevated prostate specific antigen (PSA) 09/30/2012  . Family history of malignant neoplasm of prostate 09/30/2012  . Gross hematuria 09/30/2012  . Incomplete emptying of bladder 09/30/2012  . Anemia 08/30/2012  . 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 Hypercholesterolemia 08/30/2012  . Herpes zoster 11/28/2011  . Hypertension, benign 04/02/2011  .  Myasthenia gravis without exacerbation ([place]) 03/31/2011    Andrey Spearman, MS, OTR/L, CLT-LANA 02/20/21 12:50 PM  [place] MAIN [facility] SERVICES [address] [place], [place], [address] Phone: [phone]   Fax:  [phone]  Name: [name] MRN: [id number] Date of Birth: Nov 30, 1925

## 2021-02-22 ENCOUNTER — Ambulatory Visit (INDEPENDENT_AMBULATORY_CARE_PROVIDER_SITE_OTHER): Payer: Medicare Other | Admitting: Internal Medicine

## 2021-02-22 ENCOUNTER — Encounter: Payer: Self-pay | Admitting: Internal Medicine

## 2021-02-22 ENCOUNTER — Other Ambulatory Visit: Payer: Self-pay

## 2021-02-22 ENCOUNTER — Ambulatory Visit: Payer: Medicare Other | Admitting: Occupational Therapy

## 2021-02-22 VITALS — BP 128/60 | HR 84 | Temp 98.3°F | Ht 70.0 in | Wt 205.0 lb

## 2021-02-22 DIAGNOSIS — I2510 Atherosclerotic heart disease of native coronary artery without angina pectoris: Secondary | ICD-10-CM

## 2021-02-22 DIAGNOSIS — G7080 Lambert-Eaton syndrome, unspecified: Secondary | ICD-10-CM

## 2021-02-22 DIAGNOSIS — E7800 Pure hypercholesterolemia, unspecified: Secondary | ICD-10-CM | POA: Diagnosis not present

## 2021-02-22 DIAGNOSIS — I872 Venous insufficiency (chronic) (peripheral): Secondary | ICD-10-CM

## 2021-02-22 DIAGNOSIS — I10 Essential (primary) hypertension: Secondary | ICD-10-CM | POA: Diagnosis not present

## 2021-02-22 DIAGNOSIS — I712 Thoracic aortic aneurysm, without rupture, unspecified: Secondary | ICD-10-CM

## 2021-02-22 DIAGNOSIS — I890 Lymphedema, not elsewhere classified: Secondary | ICD-10-CM

## 2021-02-22 DIAGNOSIS — D649 Anemia, unspecified: Secondary | ICD-10-CM

## 2021-02-22 DIAGNOSIS — R739 Hyperglycemia, unspecified: Secondary | ICD-10-CM

## 2021-02-22 DIAGNOSIS — K625 Hemorrhage of anus and rectum: Secondary | ICD-10-CM

## 2021-02-22 DIAGNOSIS — I5022 Chronic systolic (congestive) heart failure: Secondary | ICD-10-CM | POA: Diagnosis not present

## 2021-02-22 DIAGNOSIS — G7000 Myasthenia gravis without (acute) exacerbation: Secondary | ICD-10-CM

## 2021-02-22 LAB — LIPID PANEL
Cholesterol: 140 mg/dL (ref 0–200)
HDL: 44.7 mg/dL (ref >39.00)
LDL Cholesterol: 71 mg/dL (ref 0–99)
NonHDL: 95.3
Total CHOL/HDL Ratio: 3
Triglycerides: 121 mg/dL (ref 0.0–149.0)
VLDL: 24.2 mg/dL (ref 0.0–40.0)

## 2021-02-22 LAB — BASIC METABOLIC PANEL
BUN: 16 mg/dL (ref 6–23)
CO2: 30 mEq/L (ref 19–32)
Calcium: 9.2 mg/dL (ref 8.4–10.5)
Chloride: 102 mEq/L (ref 96–112)
Creatinine, Ser: 0.93 mg/dL (ref 0.40–1.50)
GFR: 71.3 mL/min (ref >60.00)
Glucose, Bld: 99 mg/dL (ref 70–99)
Potassium: 4 mEq/L (ref 3.5–5.1)
Sodium: 141 mEq/L (ref 135–145)

## 2021-02-22 LAB — CBC WITH DIFFERENTIAL/PLATELET
Basophils Absolute: 0 10*3/uL (ref 0.0–0.1)
Basophils Relative: 0.8 % (ref 0.0–3.0)
Eosinophils Absolute: 0.1 10*3/uL (ref 0.0–0.7)
Eosinophils Relative: 1.7 % (ref 0.0–5.0)
HCT: 42.6 % (ref 39.0–52.0)
Hemoglobin: 13.7 g/dL (ref 13.0–17.0)
Lymphocytes Relative: 22.5 % (ref 12.0–46.0)
Lymphs Abs: 1.1 10*3/uL (ref 0.7–4.0)
MCHC: 32.1 g/dL (ref 30.0–36.0)
MCV: 91 fl (ref 78.0–100.0)
Monocytes Absolute: 0.4 10*3/uL (ref 0.1–1.0)
Monocytes Relative: 9.1 % (ref 3.0–12.0)
Neutro Abs: 3.2 10*3/uL (ref 1.4–7.7)
Neutrophils Relative %: 65.9 % (ref 43.0–77.0)
Platelets: 152 10*3/uL (ref 150.0–400.0)
RBC: 4.69 Mil/uL (ref 4.22–5.81)
RDW: 14.9 % (ref 11.5–15.5)
WBC: 4.9 10*3/uL (ref 4.0–10.5)

## 2021-02-22 LAB — TSH: TSH: 2.63 u[IU]/mL (ref 0.35–4.50)

## 2021-02-22 LAB — HEPATIC FUNCTION PANEL
ALT: 6 U/L (ref 0–53)
AST: 11 U/L (ref 0–37)
Albumin: 4.1 g/dL (ref 3.5–5.2)
Alkaline Phosphatase: 71 U/L (ref 39–117)
Bilirubin, Direct: 0.2 mg/dL (ref 0.0–0.3)
Total Bilirubin: 0.9 mg/dL (ref 0.2–1.2)
Total Protein: 6.1 g/dL (ref 6.0–8.3)

## 2021-02-22 LAB — HEMOGLOBIN A1C: Hgb A1c MFr Bld: 5.9 % (ref 4.6–6.5)

## 2021-02-22 NOTE — Progress Notes (Signed)
Patient ID: [name], male   DOB: 1926-02-06, 87 y.o.   MRN: [id number]   Subjective:    Patient ID: [name], male    DOB: Dec 27, 1925, 87 y.o.   MRN: [id number]  HPI This visit occurred during the SARS-CoV-2 public health emergency.  Safety protocols were in place, including screening questions prior to the visit, additional usage of staff PPE, and extensive cleaning of exam room while observing appropriate contact time as indicated for disinfecting solutions.  Patient here for a scheduled follow up.  Here to follow up regarding his lower extremity swelling, CAD, CHF, hypertension and hypercholesterolemia.  Last ECHO - EF 30%.  Last stress test - apical myocardial perfusion defect c/w previous infarct with no evidence of myocardial ischemia.  Saw cardiology 02/06/21.  Note reviewed.  Per review of discontinued medications, coreg and farxiga - stopped.  Pt states he feels better.  Energy is better.  Breathing stable.  No chest pain.  No nausea or vomiting.  Bowels are moving.  Will still occasionally notice some blood after bowel movement - or with wiping.  No pain and "swelling" like he was experiencing previously.  Had discussed referral previously to surgery.  He declined referral - stating he was better.  Given persistence, discussed surgery evaluation.  He is agreeable.  Being followed by lymphedema clinic.  Legs wrapped.    Past Medical History:  Diagnosis Date  . Abnormal chest CT 03/28/2017  . Allergy   . Anemia   . Angina pectoris ([place]) 04/02/2011  . Aortic aneurysm ([place]) 03/28/2017   Saw Dr Genevive Bi 04/16/17 - recommended f/u CT in 3 months.    . Atherosclerosis of both carotid arteries 07/17/2014  . Benign lipomatous neoplasm of skin and subcutaneous tissue of left arm 10/04/2013  . Bilateral carotid artery stenosis 08/30/2014   Overview:  Less than 50% 2015  . Bladder cancer ([place]) 03/05/2013  . CAD (coronary artery disease) 06/18/2014  . Cancer ([place]) 11-24-12   bladder. BCG treatments  by Dr Jacqlyn Larsen.  . Chicken pox   . Chronic prostatitis 09/30/2012  . Colon polyp   . Congestive heart failure ([place]) 04/20/2014   Overview:  Overview:  With anterior mi and moderate lv dyfunction ef 35%  . Dizziness 12/22/2016  . 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 Eaton-Lambert syndrome ([place])   . Elevated prostate specific antigen (PSA) 09/30/2012  . Essential hypertension, benign   . Gross hematuria 09/30/2012  . Herpes zoster 11/28/2011   Overview:  with ocular involvement OD  . Hypercholesterolemia   . Hypertension, benign 04/02/2011  . Hypotension   . Moderate mitral insufficiency 05/18/2015  . Moderate tricuspid insufficiency 05/18/2015  . Myasthenia gravis ([place])   . Myasthenia gravis without exacerbation ([place]) 03/31/2011  . Shingles 2013  . Squamous cell carcinoma of skin 11/22/2015   Left cheek. WD SCC. Re-shaved 01/14/2016. Excised 02/05/2016, margins free.  Marland Kitchen Squamous cell carcinoma of skin 11/03/2018   Right cheek ant. to sideburn. Poorly differentiated.  . Squamous cell carcinoma of skin 02/02/2019   Right mid dorsum forearm. WD SCC. EDC.   Past Surgical History:  Procedure Laterality Date  . APPENDECTOMY  1958  . CHOLECYSTECTOMY  1979  . HERNIA REPAIR  1970  . TONSILLECTOMY  1958   Family History  Problem Relation Age of Onset  . Lung cancer Sister   . Prostate cancer Brother   . Lung cancer Brother   . Arthritis Other  parent  . Colon cancer Neg Hx    Social History   Socioeconomic History  . Marital status: Married    Spouse name: Not on file  . Number of children: Not on file  . Years of education: Not on file  . Highest education level: Not on file  Occupational History  . Not on file  Tobacco Use  . Smoking status: Former Smoker    Quit date: 09/29/1958    Years since quitting: 62.4  . Smokeless tobacco: Never Used  Vaping Use  . Vaping Use: Never used  Substance and Sexual Activity  . Alcohol use: Yes    Alcohol/week: 0.0 standard drinks     Comment: occasionally  . Drug use: No  . Sexual activity: Never  Other Topics Concern  . Not on file  Social History Narrative   Pt is married with 2 children - 1 son, 1 daughter. Previously self-employed in [employer]   Social Determinants of Health   Financial Resource Strain: [place]   . Difficulty of Paying Living Expenses: Not hard at all  Food Insecurity: No Food Insecurity  . Worried About Charity fundraiser in the Last Year: Never true  . Ran Out of Food in the Last Year: Never true  Transportation Needs: No Transportation Needs  . Lack of Transportation (Medical): No  . Lack of Transportation (Non-Medical): No  Physical Activity: Not on file  Stress: No Stress Concern Present  . Feeling of Stress : Not at all  Social Connections: Unknown  . Frequency of Communication with Friends and Family: More than three times a week  . Frequency of Social Gatherings with Friends and Family: More than three times a week  . Attends Religious Services: Not on file  . Active Member of Clubs or Organizations: Not on file  . Attends Archivist Meetings: Not on file  . Marital Status: Widowed    Outpatient Encounter Medications as of 02/22/2021  Medication Sig  . aspirin 81 MG tablet Take 81 mg by mouth daily.  . finasteride (PROSCAR) 5 MG tablet Take 5 mg by mouth daily.  . hydroxypropyl methylcellulose / hypromellose (ISOPTO TEARS / GONIOVISC) 2.5 % ophthalmic solution Place 1 drop into both eyes daily.  . mometasone (ELOCON) 0.1 % cream Apply to lower leg after showering, prior to wrapping.  . mupirocin ointment (BACTROBAN) 2 % Apply to open sores prior to wrapping.  . mycophenolate (CELLCEPT) 250 MG capsule Take 1,000 mg by mouth 2 (two) times daily.   . pravastatin (PRAVACHOL) 20 MG tablet Take 1 tablet (20 mg total) by mouth daily.  . prednisoLONE acetate (PRED FORTE) 1 % ophthalmic suspension Place 1 drop into the right eye daily. Per patient.  .  pyridostigmine (MESTINON) 60 MG tablet Take 30 mg by mouth 6 (six) times daily.   . tamsulosin (FLOMAX) 0.4 MG CAPS Take 0.4 mg by mouth daily.  . furosemide (LASIX) 40 MG tablet Take 1 tablet (40 mg total) by mouth daily.  . [DISCONTINUED] lisinopril (ZESTRIL) 5 MG tablet Hold until outpatient followup since your blood pressure was on the low side in the hospital. (Patient not taking: Reported on 02/22/2021)  . [DISCONTINUED] nystatin cream (MYCOSTATIN) Apply 1 application topically 2 (two) times daily. (Patient not taking: Reported on 02/22/2021)  . [DISCONTINUED] Probiotic Product (ALIGN) 4 MG CAPS One capsule q day (Patient not taking: Reported on 02/22/2021)   No facility-administered encounter medications on file as of 02/22/2021.  Review of Systems  Constitutional: Negative for appetite change and unexpected weight change.  HENT: Negative for congestion and sinus pressure.   Respiratory: Negative for cough and chest tightness.        Breathing stable.   Cardiovascular: Negative for chest pain and palpitations.       Increased leg swelling - lymphedema - followed by lymphedema clinic.   Gastrointestinal: Negative for abdominal pain, diarrhea, nausea and vomiting.  Genitourinary: Negative for difficulty urinating and dysuria.  Musculoskeletal: Negative for joint swelling and myalgias.  Skin: Negative for color change and rash.  Neurological: Negative for dizziness, light-headedness and headaches.  Psychiatric/Behavioral: Negative for agitation and dysphoric mood.       Objective:    Physical Exam Vitals reviewed.  Constitutional:      General: He is not in acute distress.    Appearance: Normal appearance. He is well-developed.  HENT:     Head: Normocephalic and atraumatic.     Right Ear: External ear normal.     Left Ear: External ear normal.  Eyes:     General: No scleral icterus.       Right eye: No discharge.        Left eye: No discharge.     Conjunctiva/sclera:  Conjunctivae normal.  Cardiovascular:     Rate and Rhythm: Normal rate and regular rhythm.  Pulmonary:     Effort: Pulmonary effort is normal. No respiratory distress.     Comments: Dry crackles - base of lung.  Stable.  Abdominal:     General: Bowel sounds are normal.     Palpations: Abdomen is soft.     Tenderness: There is no abdominal tenderness.  Genitourinary:    Comments: Rectal - no swelling or peri rectal erythema.  No masses palpated.  Heme positive on exam.  Musculoskeletal:        General: No tenderness.     Cervical back: Neck supple. No tenderness.     Comments: Increased lower extremity swelling/lymphedema. Legs wrapped.    Lymphadenopathy:     Cervical: No cervical adenopathy.  Skin:    Findings: No erythema or rash.  Neurological:     Mental Status: He is alert.  Psychiatric:        Mood and Affect: Mood normal.        Behavior: Behavior normal.     BP 128/60   Pulse 84   Temp 98.3 F (36.8 C)   Ht _0  (1.778 m)   Wt 205 lb (93 kg)   SpO2 90%   BMI 29.41 kg/m  Wt Readings from Last 3 Encounters:  02/22/21 205 lb (93 kg)  12/03/20 204 lb (92.5 kg)  11/19/20 211 lb (95.7 kg)     Lab Results  Component Value Date   WBC 4.9 02/22/2021   HGB 13.7 02/22/2021   HCT 42.6 02/22/2021   PLT 152.0 02/22/2021   GLUCOSE 99 02/22/2021   CHOL 140 02/22/2021   TRIG 121.0 02/22/2021   HDL 44.70 02/22/2021   LDLDIRECT 143.5 08/26/2013   LDLCALC 71 02/22/2021   ALT 6 02/22/2021   AST 11 02/22/2021   NA 141 02/22/2021   K 4.0 02/22/2021   CL 102 02/22/2021   CREATININE 0.93 02/22/2021   BUN 16 02/22/2021   CO2 30 02/22/2021   TSH 2.63 02/22/2021   HGBA1C 5.9 02/22/2021    ECHOCARDIOGRAM COMPLETE  Result Date: 01/16/2020    ECHOCARDIOGRAM REPORT   Patient Name:   ATTHEW COUTANT  Date of Exam: 01/15/2020 Medical Rec #:  [id number]       Height:       70.0 in Accession #:    [id number]      Weight:       201.3 lb Date of Birth:  06-04-1926       BSA:           2.093 m Patient Age:    27 years        BP:           110/66 mmHg Patient Gender: M               HR:           65 bpm. Exam Location:  [facility] Procedure: 2D Echo and Intracardiac Opacification Agent Indications:     Dyspnea  History:         Patient has no prior history of Echocardiogram examinations.                  CHF, Previous Myocardial Infarction and CAD; Risk                  Factors:Hypertension.  Sonographer:     Raliegh Ip Thornton-Maynard Referring Phys:  [id number] [place] Diagnosing Phys: Yolonda Kida MD IMPRESSIONS  1. Ant/Apical/Septal hypo.  2. Left ventricular ejection fraction, by estimation, is 30 to 35%. The left ventricle has moderately decreased function. The left ventricle demonstrates regional wall motion abnormalities (see scoring diagram/findings for description). The left ventricular internal cavity size was mildly dilated. Left ventricular diastolic parameters are consistent with Grade III diastolic dysfunction (restrictive).  3. Right ventricular systolic function is moderately reduced. The right ventricular size is moderately enlarged. Mildly increased right ventricular wall thickness. There is moderately elevated pulmonary artery systolic pressure.  4. Left atrial size was mildly dilated.  5. Right atrial size was mildly dilated.  6. The mitral valve is grossly normal. Mild mitral valve regurgitation.  7. Tricuspid valve regurgitation is moderate.  8. The aortic valve is grossly normal. Aortic valve regurgitation is not visualized. FINDINGS  Left Ventricle: Left ventricular ejection fraction, by estimation, is 30 to 35%. The left ventricle has moderately decreased function. The left ventricle demonstrates regional wall motion abnormalities. Definity contrast agent was given IV to delineate the left ventricular endocardial borders. The left ventricular internal cavity size was mildly dilated. There is no left ventricular hypertrophy. Left ventricular diastolic parameters are consistent  with Grade III diastolic dysfunction (restrictive).  LV Wall Scoring: The entire apex is hypokinetic. Right Ventricle: The right ventricular size is moderately enlarged. Mildly increased right ventricular wall thickness. Right ventricular systolic function is moderately reduced. There is moderately elevated pulmonary artery systolic pressure. The tricuspid regurgitant velocity is 3.65 m/s, and with an assumed right atrial pressure of 10 mmHg, the estimated right ventricular systolic pressure is 53.9 mmHg. Left Atrium: Left atrial size was mildly dilated. Right Atrium: Right atrial size was mildly dilated. Pericardium: There is no evidence of pericardial effusion. Mitral Valve: The mitral valve is grossly normal. Mild mitral valve regurgitation. Tricuspid Valve: The tricuspid valve is grossly normal. Tricuspid valve regurgitation is moderate. Aortic Valve: The aortic valve is grossly normal. Aortic valve regurgitation is not visualized. Aortic valve mean gradient measures 11.0 mmHg. Aortic valve peak gradient measures 21.5 mmHg. Aortic valve area, by VTI measures 1.34 cm. Pulmonic Valve: The pulmonic valve was grossly normal. Pulmonic valve regurgitation is not visualized. Aorta: The aortic root is normal in  size and structure. IAS/Shunts: No atrial level shunt detected by color flow Doppler. Additional Comments: Ant/Apical/Septal hypo.  LEFT VENTRICLE PLAX 2D LVIDd:         5.42 cm      Diastology LVIDs:         4.53 cm      LV e' lateral:   4.12 cm/s LV PW:         1.14 cm      LV E/e' lateral: 16.2 LV IVS:        1.34 cm      LV e' medial:    3.70 cm/s LVOT diam:     2.30 cm      LV E/e' medial:  18.1 LV SV:         67 LV SV Index:   32 LVOT Area:     4.15 cm  LV Volumes (MOD) LV vol d, MOD A2C: 142.0 ml LV vol d, MOD A4C: 183.0 ml LV vol s, MOD A2C: 122.0 ml LV vol s, MOD A4C: 128.0 ml LV SV MOD A2C:     20.0 ml LV SV MOD A4C:     183.0 ml LV SV MOD BP:      31.2 ml RIGHT VENTRICLE RV S prime:     15.50 cm/s  TAPSE (M-mode): 2.7 cm LEFT ATRIUM             Index LA diam:        3.90 cm 1.86 cm/m LA Vol (A2C):   71.0 ml 33.92 ml/m LA Vol (A4C):   80.9 ml 38.65 ml/m LA Biplane Vol: 75.6 ml 36.12 ml/m  AORTIC VALVE AV Area (Vmax):    1.23 cm AV Area (Vmean):   1.31 cm AV Area (VTI):     1.34 cm AV Vmax:           232.00 cm/s AV Vmean:          158.000 cm/s AV VTI:            0.499 m AV Peak Grad:      21.5 mmHg AV Mean Grad:      11.0 mmHg LVOT Vmax:         68.70 cm/s LVOT Vmean:        49.900 cm/s LVOT VTI:          0.161 m LVOT/AV VTI ratio: 0.32  AORTA Ao Root diam: 4.00 cm MITRAL VALVE                TRICUSPID VALVE MV Area (PHT): 3.49 cm     TR Peak grad:   53.3 mmHg MV E velocity: 66.90 cm/s   TR Vmax:        365.00 cm/s MV A velocity: 122.00 cm/s MV E/A ratio:  0.55         SHUNTS                             Systemic VTI:  0.16 m                             Systemic Diam: 2.30 cm Yolonda Kida MD Electronically signed by Yolonda Kida MD Signature Date/Time: 01/16/2020/7:53:27 AM    Final        Assessment & Plan:   Problem List Items Addressed This Visit    Anemia    Follow  cbc.       Aortic aneurysm [facility])    Previously evaluated by Dr Genevive Bi.  Stable.  Recommended f/u prn.       CAD (coronary artery disease)    Continue risk factor modification.  Stable.       Chronic systolic CHF (congestive heart failure), NYHA class 3 (HCC)    Last ECHO EF 30%.  Off coreg and farxiga.  Remains on lasix and lisinopril.  Weight stable.  Breathing stable.  Follow weights.       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  Need to confirm f/u scheduled.       Hypercholesterolemia    Continue pravastatin.  Low cholesterol diet and exercise.  Follow lipid panel and liver function tests.        Relevant Orders   Hepatic function panel (Completed)   Lipid panel (Completed)   CBC with Differential/Platelet (Completed)   Hyperglycemia    Low carb diet and  exercise.  Follow met b and a1c.       Relevant Orders   Hemoglobin A1c (Completed)   Hypertension, benign - Primary    Remains on lasix and lisinopril.  Pressures as outlined.  Off coreg.  Feels better.  Follow pressures.  Follow metabolic panel.        Relevant Orders   TSH (Completed)   Basic metabolic panel (Completed)   Lymphedema    Being followed by lymphedema clinic.  Legs wrapped.  Follow.       Myasthenia gravis without exacerbation ([place])    Has been followed by neurology previously.  Stable.       Rectal bleeding    Previously noticed some increased swelling, irritation and blood as outlined in previous note.  Had some peri rectal irritation.  Was given nystatin cream.  Swelling and irritation better.  Still with some occasional issues with bowel movements and some bleeding noticed.  Heme positive on exam.  No swelling noticed.  Agreeable for surgery evaluation.       Relevant Orders   Ambulatory referral to General Surgery   Venous insufficiency of both lower extremities    Evaluated by [facility].  Now being followed by lymphedema clinic.            [name], MD

## 2021-02-24 ENCOUNTER — Encounter: Payer: Self-pay | Admitting: Internal Medicine

## 2021-02-24 DIAGNOSIS — K625 Hemorrhage of anus and rectum: Secondary | ICD-10-CM | POA: Insufficient documentation

## 2021-02-24 NOTE — Assessment & Plan Note (Signed)
Being followed by lymphedema clinic.  Legs wrapped.  Follow.

## 2021-02-24 NOTE — Assessment & Plan Note (Signed)
Previously evaluated by Dr Oaks.  Stable.  Recommended f/u prn.  

## 2021-02-24 NOTE — Assessment & Plan Note (Signed)
Follow cbc.  

## 2021-02-24 NOTE — Assessment & Plan Note (Signed)
Previously noticed some increased swelling, irritation and blood as outlined in previous note.  Had some peri rectal irritation.  Was given nystatin cream.  Swelling and irritation better.  Still with some occasional issues with bowel movements and some bleeding noticed.  Heme positive on exam.  No swelling noticed.  Agreeable for surgery evaluation.

## 2021-02-24 NOTE — Assessment & Plan Note (Signed)
Last ECHO EF 30%.  Off coreg and farxiga.  Remains on lasix and lisinopril.  Weight stable.  Breathing stable.  Follow weights.

## 2021-02-24 NOTE — Assessment & Plan Note (Signed)
Continue pravastatin.  Low cholesterol diet and exercise.  Follow lipid panel and liver function tests.   

## 2021-02-24 NOTE — Assessment & Plan Note (Signed)
Has been followed by neurology previously.  Stable.

## 2021-02-24 NOTE — Assessment & Plan Note (Signed)
Continue risk factor modification.  Stable.

## 2021-02-24 NOTE — Assessment & Plan Note (Signed)
Low carb diet and exercise.  Follow met b and a1c.  

## 2021-02-24 NOTE — Assessment & Plan Note (Signed)
Evaluated by [facility].  Now being followed by lymphedema clinic.

## 2021-02-24 NOTE — Assessment & Plan Note (Signed)
Remains on lasix and lisinopril.  Pressures as outlined.  Off coreg.  Feels better.  Follow pressures.  Follow metabolic panel.

## 2021-02-24 NOTE — Assessment & Plan Note (Signed)
Has been followed by neurology.  Need to confirm f/u scheduled.

## 2021-02-26 ENCOUNTER — Telehealth: Payer: Self-pay | Admitting: *Deleted

## 2021-02-26 NOTE — Telephone Encounter (Signed)
-----   Message from [name], MD sent at 02/24/2021 10:25 AM EDT ----- Please call [name] and notify cholesterol levels look good.  Overall sugar control is stable and ok.  Kidney function has improved and is wnl.  Hgb, thyroid test, liver function tests are wnl.

## 2021-02-27 ENCOUNTER — Other Ambulatory Visit: Payer: Self-pay

## 2021-02-27 ENCOUNTER — Ambulatory Visit: Payer: Medicare Other | Attending: Dermatology | Admitting: Occupational Therapy

## 2021-02-27 DIAGNOSIS — I890 Lymphedema, not elsewhere classified: Secondary | ICD-10-CM | POA: Diagnosis not present

## 2021-02-27 NOTE — Therapy (Signed)
[place] MAIN [facility] SERVICES [address] [place], [place], [address] Phone: [phone]   Fax:  [phone]  Occupational Therapy Treatment  Patient Details  Name: [name] MRN: [id number] Date of Birth: [birth year] Referring Provider (OT): [name]   Encounter Date: 02/27/2021   OT End of Session - 02/27/21 1112    Visit Number 16    Number of Visits 36    Date for OT Re-Evaluation 03/31/21    OT Start Time 1106    OT Stop Time 1213    OT Time Calculation (min) 67 min    Equipment Utilized During Treatment    Activity Tolerance Patient tolerated treatment well;No increased pain    Behavior During Therapy WFL for tasks assessed/performed           Past Medical History:  Diagnosis Date  . Abnormal chest CT 03/28/2017  . Allergy   . Anemia   . Angina pectoris ([place]) 04/02/2011  . Aortic aneurysm ([place]) 03/28/2017   Saw [name] 04/16/17 - recommended f/u CT in 3 months.    . Atherosclerosis of both carotid arteries 07/17/2014  . Benign lipomatous neoplasm of skin and subcutaneous tissue of left arm 10/04/2013  . Bilateral carotid artery stenosis 08/30/2014   Overview:  Less than 50% 2015  . Bladder cancer ([place]) 03/05/2013  . CAD (coronary artery disease) 06/18/2014  . Cancer ([place]) 11-24-12   bladder. BCG treatments by Joel [name].  . Chicken pox   . Chronic prostatitis 09/30/2012  . Colon polyp   . Congestive heart failure ([place]) 04/20/2014   Overview:  Overview:  With anterior mi and moderate lv dyfunction ef 35%  . Dizziness 12/22/2016  . 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 Eaton-Lambert syndrome ([place])   . Elevated prostate specific antigen (PSA) 09/30/2012  . Essential hypertension, benign   . Gross hematuria 09/30/2012  . Herpes zoster 11/28/2011   Overview:  with ocular involvement OD  . Hypercholesterolemia   . Hypertension, benign 04/02/2011  . Hypotension   . Moderate mitral insufficiency 05/18/2015  .  Moderate tricuspid insufficiency 05/18/2015  . Myasthenia gravis ([place])   . Myasthenia gravis without exacerbation ([place]) 03/31/2011  . Shingles 2013  . Squamous cell carcinoma of skin 11/22/2015   Left cheek. WD SCC. Re-shaved 01/14/2016. Excised 02/05/2016, margins free.  Marland Kitchen Squamous cell carcinoma of skin 11/03/2018   Right cheek ant. to sideburn. Poorly differentiated.  . Squamous cell carcinoma of skin 02/02/2019   Right mid dorsum forearm. WD SCC. EDC.    Past Surgical History:  Procedure Laterality Date  . APPENDECTOMY  1958  . CHOLECYSTECTOMY  1979  . HERNIA REPAIR  1970  . TONSILLECTOMY  1958    There were no vitals filed for this visit.   Subjective Assessment - 02/27/21 1114    Subjective  [name] presents for OT Rx visit 16/36 to address BLE lymphedema, L>R. He is unaccompanied today. He brings compression wraps to clinic. He reports,  the "sore spot" on my left leg is healed up. It doesn't hurt anymore. But's there's a red rash on the tope of my L foot today".    Patient is accompanied by: Family member   daughter, [name]   Pertinent History chronic BLE lymphedema (LE) and associated pain/ discomfort. Altered sensation/numbness LLE, Hx lymphorrhea, hx non-pressure related leg ulcer, stasis dermatitis, AORTIC ANEURISM, BILATERAL CAROTID STENOSIS, BLADDER CANCER 03/05/13, CAD, CHF. htn, s/p squamous cell carcinoma 2020 x  2, 20117    Limitations decreased endurance, decreased balance, chronic leg swelling w paihn and hx skin ulcer    Repetition Increases Symptoms    Special Tests +Stemmer bilaterally, FOTO TBA Rx visit 1    Patient Stated Goals "get this swelling down in about 2 weeks"    Pain Onset Other (comment)   insideous onset ~ 5 yrs ago                       OT Treatments/Exercises (OP) - 02/27/21 1447      ADLs   ADL Education Given Yes      Manual Therapy   Manual Therapy Edema management;Compression Bandaging    Edema Management skin care  to LLE with low ph castor oil  during MLD, and with Eucerin, low ph lotion, on the RLE below the knee to increase skin hydration and limit infection risk    Manual Lymphatic Drainage (MLD) MLD to LLE/LLQ utilizing short neck sequence ( CLAVICULAR LN ONLY in keeping w CAROTID precautions), deep abdominal pathway, functional inguinal LN and J strokes from proximal to disial to thigh, leg and dorsal foot. Good tolerance.    Compression Bandaging knee length. short stretch compression wraps applied to LLE using 8 and 10 cm wide short stretch wraps in gradient configuration over stockinett and 1 layer of Rosidal 0.4 cm thick foam. Stockinett and single layer of foam from distal ankle to popliteal on the L with loaned  adjustable, Velcro style leg wrap to the RLE.                  OT Education - 02/27/21 1448    Education Details Continued skilled Pt/caregiver education  And LE ADL training throughout visit for lymphedema self care/ home program, including compression wrapping, compression garment and device wear/care, lymphatic pumping ther ex, simple self-MLD, and skin care. Discussed progress towards goals.    Person(s) Educated Patient    Methods Explanation;Demonstration;Handout    Comprehension Verbalized understanding;Returned demonstration;Need further instruction               OT Long Term Goals - 02/04/21 1253      OT LONG TERM GOAL #1   Title Given this patient's risk-adjustment variables, and is Intake Functional Status score of /100 on the FOTO tool, patient will experience at least an increase in function of 5 points, or higher.    Baseline TBA    Time 12    Period Weeks    Status On-going    Target Date 03/31/21      OT LONG TERM GOAL #2   Title Pt will be able to apply knee length, multi-layer, short stretch compression wraps to one leg using correct gradient techniques with extra time (modified independence)  decrease limb volume, to decrease leg pain and limit  infection risk, and to improve safe functional ambulation and mobility.    Baseline max a    Time 4    Period Days    Status Partially Met   Max A from caregiver required     OT LONG TERM GOAL #3   Title Patient will demonstrate understanding of LE precautions, prevention strategies and cellulitis signs and symptoms by verbalizing at least 4 examples using  printed reference( modified independence)  to limit infection risk, recurrent wounds and LE progression.    Baseline Max A    Time 4    Period Days    Status Achieved  OT LONG TERM GOAL #4   Title Pt will achieve 5%, or greater,  limb volume reduction on the R below the knee, and 10% reduction on the L below the knee (ankle to tibial tuberosity = A-D) during Intensive Phase CDT to achieve optimal limb volume reduction and to prevent re-accumulation of lymphatic congestion and fibrosis in the legs, to limit infection risk, to improve functional ambulation and transfers, to improve functional performance of basic and instrumental ADLs, and to limit LE progression.    Baseline Max A    Time 12    Period Weeks    Status On-going   TBA next visit. . Visibble reduction in LLE appears > 5%. RLE swelling is significantly increased today due to suspected cellulitis.     OT LONG TERM GOAL #5   Title Pt will demonstrate and sustain a least 85% compliance performing all daily LE self-care home program components daily throughout Intensive Phase CDT, including recommended skin care regime, lymphatic pumping ther ex, 23/7 compression wraps and simple self-MLD, to ensure optimal limb volume reduction, to limit infection risk and to limit LE progression.    Baseline Max A    Time 12    Period Weeks    Status Partially Met   Pt has had difficulty with compliance with compression wraps to date. He has not been able to apply wraps himself. A very helpful friend/ neighbor learned to apply wraps at last visit. She did apply them this past weekend    Target Date 03/31/21      OT LONG TERM GOAL #6   Title Using assistive devices and additional time  (modified independence)  Pt will be able to don and doff appropriate daytime compression garments and HOS devices to limit lymphatic re-accumulation and LE progression with before transitioning to self-management phase of CDT.    Baseline Max A    Time 12    Period Weeks   issue date   Status On-going                 Plan - 02/27/21 1449    Clinical Impression Statement RLE swelling remains well controlled. Redness from mid tibia region to ankle persists, but infection symptoms are absent. LLE presents with rash similar to contact dermatitis on dorsum of foot at base of toes. Skin appears slightly blistered and pink. Pt denies itching , but endorses mild doreness. Pt agrees to minitor skin condition carefully and report to his doctor PRN to limit recurrent cellulitis infection. Provided gentle MLD and skin care to LLE and applied kne length, 4 layer wrap as established. OTS compression stockings should arrive any day for fitting. They were ordered by family over 1 week ago.    OT Occupational Profile and History Comprehensive Assessment- Review of records and extensive additional review of physical, cognitive, psychosocial history related to current functional performance    Occupational performance deficits (Please refer to evaluation for details): ADL's;IADL's;Work;Leisure;Social Participation    Body Structure / Function / Physical Skills ADL;Decreased knowledge of use of DME;Balance;Pain;Edema;Endurance;IADL;Scar mobility;Mobility;Decreased knowledge of precautions;Skin integrity    Rehab Potential Good    Clinical Decision Making Several treatment options, min-mod task modification necessary    Comorbidities Affecting Occupational Performance: Presence of comorbidities impacting occupational performance    Comorbidities impacting occupational performance description: CVI, CAD, hx  non-pressure related leg ulcer    Modification or Assistance to Complete Evaluation  Min-Moderate modification of tasks or assist with assess necessary to complete eval  OT Frequency 2x / week    OT Duration 12 weeks    OT Treatment/Interventions Self-care/ADL training;DME and/or AE instruction;Manual lymph drainage;Compression bandaging;Therapeutic activities;Therapeutic exercise;Scar mobilization;Other (comment);Manual Therapy;Patient/family education    Plan Complete Decongestive Therapy (CDT) to sigle limb a a time to limit fall risk and impact on function. Pt and CG will be taught to apply multilayer gradient compression wraps below knee to base of toes. CDT also to include manual lymphatic drainage, skin care, therapeutic exercise and fitting with appropriate knee length compression garments that are comfortable and Pt is able to don and doff using assistive devices PRN.    Recommended Other Services Consider fitting Pt with advanced sequential pneumatic compression device PRN.    Consulted and Agree with Plan of Care Patient;Family member/caregiver    Family Member Consulted daughter, [name]           Patient will benefit from skilled therapeutic intervention in order to improve the following deficits and impairments:   Body Structure / Function / Physical Skills: ADL,Decreased knowledge of use of DME,Balance,Pain,Edema,Endurance,IADL,Scar mobility,Mobility,Decreased knowledge of precautions,Skin integrity       Visit Diagnosis: Lymphedema, not elsewhere classified    Problem List Patient Active Problem List   Diagnosis Date Noted  . Rectal bleeding 02/24/2021  . Rectal irritation 11/25/2020  . Lymphedema 11/19/2020  . Pressure injury of right buttock, stage 2 ([place]) 04/23/2020  . Acute exacerbation of CHF (congestive heart failure) ([place]) 01/13/2020  . Lower limb ulcer, calf, left, limited to breakdown of skin ([place]) 12/27/2019  . Dysuria 06/30/2019  . Hyperglycemia 06/30/2019   . LVH (left ventricular hypertrophy) due to hypertensive disease, without heart failure 05/26/2019  . Pseudophakia, left eye 11/26/2018  . Venous insufficiency of both lower extremities 10/13/2018  . Swelling of limb 10/12/2018  . Bilateral lower extremity edema 10/10/2018  . Absent pulse in lower extremity 10/10/2018  . 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 Eye pain 05/31/2018  . Mild aortic valve stenosis 12/03/2017  . Epididymoorchitis 09/02/2017  . Acute cystitis with hematuria 08/11/2017  . Hydrocele, right 08/11/2017  . Aortic aneurysm ([place]) 03/28/2017  . Abnormal chest CT 03/28/2017  . Hypotension   . Dizziness 12/22/2016  . Leg weakness 03/11/2016  . Skin lesion 09/11/2015  . Moderate mitral insufficiency 05/18/2015  . Moderate tricuspid insufficiency 05/18/2015  . Benign essential hypertension 04/25/2015  . Rash 03/10/2015  . Health care maintenance 03/10/2015  . Bilateral carotid artery stenosis 08/30/2014  . Atherosclerosis of both carotid arteries 07/17/2014  . Chronic systolic CHF (congestive heart failure), NYHA class 3 ([place]) 07/17/2014  . CAD (coronary artery disease) 06/18/2014  . CHF (congestive heart failure) ([place]) 04/20/2014  . Congestive heart failure ([place]) 04/20/2014  . SOB (shortness of breath) 03/10/2014  . Abnormal EKG 03/10/2014  . Mixed hyperlipidemia 03/10/2014  . Benign lipomatous neoplasm of skin and subcutaneous tissue of left arm 10/04/2013  . Arm skin lesion, left 09/04/2013  . History of shingles 09/04/2013  . History of bladder cancer 03/05/2013  . Chronic prostatitis 09/30/2012  . Elevated prostate specific antigen (PSA) 09/30/2012  . Family history of malignant neoplasm of prostate 09/30/2012  . Gross hematuria 09/30/2012  . Incomplete emptying of bladder 09/30/2012  . Anemia 08/30/2012  . 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 Hypercholesterolemia 08/30/2012  . Herpes zoster 11/28/2011  . Hypertension, benign  04/02/2011  . Myasthenia gravis without exacerbation ([place]) 03/31/2011    Andrey Spearman, MS, OTR/L, [facility] 02/27/21 2:55 PM   [place]  [place] [facility]IN [facility] SERVICES [address] [place], [place], [address] Phone: [phone]   Fax:  [phone]  Name: [name] MRN: [id number] Date of Birth: October 20, 1925

## 2021-02-28 ENCOUNTER — Telehealth: Payer: Self-pay

## 2021-02-28 NOTE — Telephone Encounter (Signed)
Left message to call back for lab results.

## 2021-03-01 ENCOUNTER — Ambulatory Visit: Payer: Medicare Other | Admitting: Occupational Therapy

## 2021-03-05 ENCOUNTER — Ambulatory Visit: Payer: Medicare Other | Admitting: Occupational Therapy

## 2021-03-07 ENCOUNTER — Other Ambulatory Visit: Payer: Self-pay

## 2021-03-07 ENCOUNTER — Ambulatory Visit: Payer: Medicare Other | Admitting: Occupational Therapy

## 2021-03-07 DIAGNOSIS — I890 Lymphedema, not elsewhere classified: Secondary | ICD-10-CM | POA: Diagnosis not present

## 2021-03-07 NOTE — Therapy (Signed)
[facility] [address] [place], [place], [address] Phone: [phone]   Fax:  [phone]  Occupational Therapy Treatment  Patient Details  Name: [name] MRN: [id number] Date of Birth: 24-Jul-1926 Referring Provider (OT): [name], MD   Encounter Date: 03/07/2021   OT End of Session - 03/07/21 1219     Visit Number 17    Number of Visits 36    Date for OT Re-Evaluation 03/31/21    OT Start Time 1100    OT Stop Time 1200    OT Time Calculation (min) 60 min    Equipment Utilized During Treatment mupirocin ointment (bactroban), mometasone cream (elocon)- both brought from dermatologist.    Activity Tolerance Patient tolerated treatment well;No increased pain    Behavior During Therapy [facility] for tasks assessed/performed             Past Medical History:  Diagnosis Date   Abnormal chest CT 03/28/2017   Allergy    Anemia    Angina pectoris ([place]) 04/02/2011   Aortic aneurysm ([place]) 03/28/2017   Saw Dr Genevive Bi 04/16/17 - recommended f/u CT in 3 months.     Atherosclerosis of both carotid arteries 07/17/2014   Benign lipomatous neoplasm of skin and subcutaneous tissue of left arm 10/04/2013   Bilateral carotid artery stenosis 08/30/2014   Overview:  Less than 50% 2015   Bladder cancer ([place]) 03/05/2013   CAD (coronary artery disease) 06/18/2014   Cancer ([place]) 11-24-12   bladder. BCG treatments by Dr Jacqlyn Larsen.   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 WD SCC. Re-shaved 01/14/2016. Excised 02/05/2016, margins free.   Squamous cell carcinoma of skin 11/03/2018   Right cheek ant. to sideburn. Poorly differentiated.   Squamous cell carcinoma of skin 02/02/2019   Right mid dorsum forearm. WD SCC. EDC.    Past Surgical History:  Procedure Laterality Date   [place]    There were no vitals filed for this visit.   Subjective Assessment - 03/07/21 1248     Subjective  [name] presents for OT Rx visit 17/36 to address BLE lymphedema, L>R. He is unaccompanied today. Pt was last seen on 02/27/21. He reports he missed last OT appointment b/c schedule was changed and he was not notified. Pt brings recommended OTS compression knee highs to clinic for fitting. He tells me he has tried to put them on a time otr two, but it was very difficult and took a long time.    Patient is accompanied by: Family member   daughter, [name]   Pertinent History chronic BLE  lymphedema (LE) and associated pain/ discomfort. Altered sensation/numbness LLE, Hx lymphorrhea, hx non-pressure related leg ulcer, stasis dermatitis, AORTIC ANEURISM, BILATERAL CAROTID STENOSIS, BLADDER CANCER 03/05/13, CAD, CHF. htn, s/p squamous cell carcinoma 2020 x 2, 20117    Limitations decreased endurance, decreased balance, chronic leg swelling w paihn and hx skin ulcer    Repetition Increases Symptoms    Special Tests +[place] bilaterally, FOTO TBA Rx visit 1    Patient Stated Goals "get this swelling down in about 2 weeks"    Pain Onset Other (comment)   insideous onset ~ 5 yrs ago                         OT  Treatments/Exercises (OP) - 03/07/21 1251       ADLs   ADL Education Given Yes      Manual Therapy   Manual Therapy Edema management    Edema Management Fitting for Juzo SOFT (2002) , ccl 2, size V,  A-D w/ open toe and silicone top band                    OT Education - 03/07/21 1254     Education Details Continued skilled Pt/caregiver education  And LE ADL training throughout visit for lymphedema self care/ home program, including compression wrapping, compression garment and device wear/care, lymphatic pumping ther ex, simple self-MLD, and skin care. Discussed progress towards goals.    Person(s) Educated Patient    Methods Explanation;Demonstration;Handout    Comprehension Verbalized understanding;Returned demonstration;Need further instruction                 OT Long Term Goals - 02/04/21 1253       OT LONG TERM GOAL #1   Title Given this patient's risk-adjustment variables, and is Intake Functional Status score of /100 on the FOTO tool, patient will experience at least an increase in function of 5 points, or higher.    Baseline TBA    Time 12    Period Weeks    Status On-going    Target Date 03/31/21      OT LONG TERM GOAL #2   Title Pt will be able to apply knee length, multi-layer, short stretch compression wraps to one leg using correct gradient techniques with extra time (modified independence)  decrease limb volume, to decrease leg pain and limit infection risk, and to improve safe functional ambulation and mobility.    Baseline max a    Time 4    Period Days    Status Partially Met   Max A from caregiver required     OT LONG TERM GOAL #3   Title Patient will demonstrate understanding of LE precautions, prevention strategies and cellulitis signs and symptoms by verbalizing at least 4 examples using  printed reference( modified independence)  to limit infection risk, recurrent wounds and LE progression.    Baseline Max A    Time 4    Period Days     Status Achieved      OT LONG TERM GOAL #4   Title Pt will achieve 5%, or greater,  limb volume reduction on the R below the knee, and 10% reduction on the L below the knee (ankle to tibial tuberosity = A-D) during Intensive Phase CDT to achieve optimal limb volume reduction and to prevent re-accumulation of lymphatic congestion and fibrosis in the legs, to limit infection risk, to improve functional ambulation and transfers, to improve functional  performance of basic and instrumental ADLs, and to limit LE progression.    Baseline Max A    Time 12    Period Weeks    Status On-going   TBA next visit. . Visibble reduction in LLE appears > 5%. RLE swelling is significantly increased today due to suspected cellulitis.     OT LONG TERM GOAL #5   Title Pt will demonstrate and sustain a least 85% compliance performing all daily LE self-care home program components daily throughout Intensive Phase CDT, including recommended skin care regime, lymphatic pumping ther ex, 23/7 compression wraps and simple self-MLD, to ensure optimal limb volume reduction, to limit infection risk and to limit LE progression.    Baseline Max A    Time 12    Period Weeks    Status Partially Met   Pt has had difficulty with compliance with compression wraps to date. He has not been able to apply wraps himself. A very helpful friend/ neighbor learned to apply wraps at last visit. She did apply them this past weekend   Target Date 03/31/21      OT LONG TERM GOAL #6   Title Using assistive devices and additional time  (modified independence)  Pt will be able to don and doff appropriate daytime compression garments and HOS devices to limit lymphatic re-accumulation and LE progression with before transitioning to self-management phase of CDT.    Baseline Max A    Time 12    Period Weeks   issue date   Status On-going                   Plan - 03/07/21 1245     Clinical Impression Statement Pt last seen 02/27/21. He  missed last scheduled appointment stating his schedule was changed and he was not told. Pt brings recommended compression stockings to clinic for fitting and donning/ doffing training. . Pt denies LE-related leg pain. Emphasis of visit on Pt edu for donning and doffing off-the-shelf, circular knit, Juzo ccl 2 ( 30-40 mmhg) knee length, elastic compression stockings using assistive devices and adapted techniques. Pt tried several devices and by end of session he was most sucessful using [employer] [place] donning frame        with tyvek slipper sock and friction gloves     with low foot stool. Pt reports he is able to doff stockings with extra time and he declines devices for doffing. Loaned Pt the donning frame to practice with over the weekend before he buys one. Also provided printed references for obtaiing donning aid and [employer] garment adhesive PRN. Cont as per POC.    OT Occupational Profile and History Comprehensive Assessment- Review of records and extensive additional review of physical, cognitive, psychosocial history related to current functional performance    Occupational performance deficits (Please refer to evaluation for details): ADL's;IADL's;Work;Leisure;Social Participation    Body Structure / Function / Physical Skills ADL;Decreased knowledge of use of DME;Balance;Pain;Edema;Endurance;IADL;Scar mobility;Mobility;Decreased knowledge of precautions;Skin integrity    Rehab Potential Good    Clinical Decision Making Several treatment options, min-mod task modification necessary    Comorbidities Affecting Occupational Performance: Presence of comorbidities impacting occupational performance    Comorbidities impacting occupational performance description: CVI, CAD, hx non-pressure related leg ulcer    Modification or Assistance to Complete Evaluation  Min-Moderate modification of tasks or assist with assess necessary to complete eval    OT Frequency 2x / week    OT Duration 12 weeks  OT Treatment/Interventions Self-care/ADL training;DME and/or AE instruction;Manual lymph drainage;Compression bandaging;Therapeutic activities;Therapeutic exercise;Scar mobilization;Other (comment);Manual Therapy;Patient/family education    Plan Complete Decongestive Therapy (CDT) to sigle limb a a time to limit fall risk and impact on function. Pt and CG will be taught to apply multilayer gradient compression wraps below knee to base of toes. CDT also to include manual lymphatic drainage, skin care, therapeutic exercise and fitting with appropriate knee length compression garments that are comfortable and Pt is able to don and doff using assistive devices PRN.    Recommended Other Services Consider fitting Pt with advanced sequential pneumatic compression device PRN.    Consulted and Agree with Plan of Care Patient;Family member/caregiver    Family Member Consulted daughter, [name]             Patient will benefit from skilled therapeutic intervention in order to improve the following deficits and impairments:   Body Structure / Function / Physical Skills: ADL, Decreased knowledge of use of DME, Balance, Pain, Edema, Endurance, IADL, Scar mobility, Mobility, Decreased knowledge of precautions, Skin integrity       Visit Diagnosis: Lymphedema, not elsewhere classified    Problem List Patient Active Problem List   Diagnosis Date Noted   Rectal bleeding 02/24/2021   Rectal irritation 11/25/2020   Lymphedema 11/19/2020   Pressure injury of right buttock, stage 2 ([place]) 04/23/2020   Acute exacerbation of CHF (congestive heart failure) ([place]) 01/13/2020   Lower limb ulcer, calf, left, limited to breakdown of skin ([place]) 12/27/2019   Dysuria 06/30/2019   Hyperglycemia 06/30/2019   LVH (left ventricular hypertrophy) due to hypertensive disease, without heart failure 05/26/2019   Pseudophakia, left eye 11/26/2018   Venous insufficiency of both lower extremities 10/13/2018   Swelling of  limb 10/12/2018   Bilateral lower extremity edema 10/10/2018   Absent pulse in lower extremity 10/10/2018   Corneal thinning of right eye 07/17/2018   Eye pain 05/31/2018   Mild aortic valve stenosis 12/03/2017   Epididymoorchitis 09/02/2017   Acute cystitis with hematuria 08/11/2017   Hydrocele, right 08/11/2017   Aortic aneurysm ([place]) 03/28/2017   Abnormal chest CT 03/28/2017   Hypotension    Dizziness 12/22/2016   Leg weakness 03/11/2016   Skin lesion 09/11/2015   Moderate mitral insufficiency 05/18/2015   Moderate tricuspid insufficiency 05/18/2015   Benign essential hypertension 04/25/2015   Rash 03/10/2015   Health care maintenance 03/10/2015   Bilateral carotid artery stenosis 08/30/2014   Atherosclerosis of both carotid arteries 19/37/9024   Chronic systolic CHF (congestive heart failure), NYHA class 3 ([place]) 07/17/2014   CAD (coronary artery disease) 06/18/2014   CHF (congestive heart failure) ([place]) 04/20/2014   Congestive heart failure ([place]) 04/20/2014   SOB (shortness of breath) 03/10/2014   Abnormal EKG 03/10/2014   Mixed hyperlipidemia 03/10/2014   Benign lipomatous neoplasm of skin and subcutaneous tissue of left arm 10/04/2013   Arm skin lesion, left 09/04/2013   History of shingles 09/04/2013   History of bladder cancer 03/05/2013   Chronic prostatitis 09/30/2012   Elevated prostate specific antigen (PSA) 09/30/2012   Family history of malignant neoplasm of prostate 09/30/2012   Gross hematuria 09/30/2012   Incomplete emptying of bladder 09/30/2012   Anemia 08/30/2012   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

## 2021-03-12 ENCOUNTER — Other Ambulatory Visit: Payer: Self-pay

## 2021-03-12 ENCOUNTER — Ambulatory Visit: Payer: Medicare Other | Admitting: Dermatology

## 2021-03-12 ENCOUNTER — Ambulatory Visit: Payer: Medicare Other | Admitting: Occupational Therapy

## 2021-03-12 DIAGNOSIS — I890 Lymphedema, not elsewhere classified: Secondary | ICD-10-CM | POA: Diagnosis not present

## 2021-03-13 ENCOUNTER — Other Ambulatory Visit: Payer: Self-pay

## 2021-03-13 ENCOUNTER — Ambulatory Visit: Payer: Medicare Other | Admitting: Dermatology

## 2021-03-13 DIAGNOSIS — L820 Inflamed seborrheic keratosis: Secondary | ICD-10-CM | POA: Diagnosis not present

## 2021-03-13 DIAGNOSIS — L89320 Pressure ulcer of left buttock, unstageable: Secondary | ICD-10-CM

## 2021-03-13 DIAGNOSIS — L578 Other skin changes due to chronic exposure to nonionizing radiation: Secondary | ICD-10-CM | POA: Diagnosis not present

## 2021-03-13 DIAGNOSIS — I872 Venous insufficiency (chronic) (peripheral): Secondary | ICD-10-CM

## 2021-03-13 DIAGNOSIS — L570 Actinic keratosis: Secondary | ICD-10-CM | POA: Diagnosis not present

## 2021-03-13 DIAGNOSIS — B078 Other viral warts: Secondary | ICD-10-CM | POA: Diagnosis not present

## 2021-03-13 NOTE — Patient Instructions (Signed)

## 2021-03-13 NOTE — Therapy (Signed)
[place] MAIN [facility] SERVICES [address] [place], [place], [address] Phone: [phone]   Fax:  [phone]  Occupational Therapy Treatment  Patient Details  Name: [name] MRN: [id number] Date of Birth: Nov 24, 1925 Referring Provider (OT): [name], MD   Encounter Date: 03/12/2021   OT End of Session - 03/12/21 0831     Visit Number 18    Number of Visits 36    Date for OT Re-Evaluation 03/31/21    OT Start Time 1100    OT Stop Time 1215    OT Time Calculation (min) 75 min    Equipment Utilized During Treatment mupirocin ointment (bactroban), mometasone cream (elocon)- both brought from dermatologist.    Activity Tolerance Patient tolerated treatment well;No increased pain    Behavior During Therapy [facility] for tasks assessed/performed             Past Medical History:  Diagnosis Date   Abnormal chest CT 03/28/2017   Allergy    Anemia    Angina pectoris ([place]) 04/02/2011   Aortic aneurysm ([place]) 03/28/2017   Saw Dr Genevive Bi 04/16/17 - recommended f/u CT in 3 months.     Atherosclerosis of both carotid arteries 07/17/2014   Benign lipomatous neoplasm of skin and subcutaneous tissue of left arm 10/04/2013   Bilateral carotid artery stenosis 08/30/2014   Overview:  Less than 50% 2015   Bladder cancer ([place]) 03/05/2013   CAD (coronary artery disease) 06/18/2014   Cancer ([place]) 11-24-12   bladder. BCG treatments by Dr Jacqlyn Larsen.   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 WD SCC. Re-shaved 01/14/2016. Excised 02/05/2016, margins free.   Squamous cell carcinoma of skin 11/03/2018   Right cheek ant. to sideburn. Poorly differentiated.   Squamous cell carcinoma of skin 02/02/2019   Right mid dorsum forearm. WD SCC. EDC.    Past Surgical History:  Procedure Laterality Date   [place]    There were no vitals filed for this visit.   Subjective Assessment - 03/13/21 0037     Subjective  Mr [name] presents for OT Rx visit 18/36 to address BLE lymphedema, L>R. He is unaccompanied today. Pt presents wearing recommended knee length compression stockings today. "I can get them on and off using that frame you loaned me and some gloves."    Patient is accompanied by: Family member   daughter, [name]   Pertinent History chronic BLE lymphedema (LE) and associated pain/ discomfort. Altered sensation/numbness LLE, Hx lymphorrhea, hx non-pressure related leg ulcer, stasis dermatitis, AORTIC ANEURISM, BILATERAL CAROTID STENOSIS, BLADDER CANCER 03/05/13, CAD, CHF. htn, s/p squamous cell  carcinoma 2020 x 2, 20117    Limitations decreased endurance, decreased balance, chronic leg swelling w paihn and hx skin ulcer    Repetition Increases Symptoms    Special Tests +Stemmer bilaterally, FOTO TBA Rx visit 1    Patient Stated Goals "get this swelling down in about 2 weeks"    Pain Onset Other (comment)   insideous onset ~ 5 yrs ago                         OT Treatments/Exercises (OP) - 03/13/21 1096       ADLs   ADL Education Given Yes      Manual Therapy   Manual Therapy Edema management    Edema  Management skin care during MLD as established    Manual Lymphatic Drainage (MLD) MLD to LLE/LLQ utilizing short neck sequence ( CLAVICULAR LN ONLY in keeping w CAROTID precautions), deep abdominal pathway, functional inguinal LN and J strokes from proximal to disial to thigh, leg and dorsal foot. Good tolerance.    Compression Bandaging Max A     to doff and done compression stockings in clinic to save time                    OT Education - 03/13/21 0842     Education Details Continued skilled Pt/caregiver education  And LE ADL training throughout visit for lymphedema self care/ home program, including compression wrapping, compression garment and device wear/care, lymphatic pumping ther ex, simple self-MLD, and skin care. Discussed progress towards goals and POC going forward.    Person(s) Educated Patient    Methods Explanation;Demonstration;Handout    Comprehension Verbalized understanding;Returned demonstration;Need further instruction                 OT Long Term Goals - 02/04/21 1253       OT LONG TERM GOAL #1   Title Given this patient's risk-adjustment variables, and is Intake Functional Status score of /100 on the FOTO tool, patient will experience at least an increase in function of 5 points, or higher.    Baseline TBA    Time 12    Period Weeks    Status On-going    Target Date 03/31/21      OT LONG TERM GOAL #2   Title Pt will be able to apply knee length, multi-layer, short stretch compression wraps to one leg using correct gradient techniques with extra time (modified independence)  decrease limb volume, to decrease leg pain and limit infection risk, and to improve safe functional ambulation and mobility.    Baseline max a    Time 4    Period Days    Status Partially Met   Max A from caregiver required     OT LONG TERM GOAL #3   Title Patient will demonstrate understanding of LE precautions, prevention strategies and cellulitis signs and symptoms by  verbalizing at least 4 examples using  printed reference( modified independence)  to limit infection risk, recurrent wounds and LE progression.    Baseline Max A    Time 4    Period Days    Status Achieved      OT LONG TERM GOAL #4   Title Pt will achieve 5%, or greater,  limb volume reduction on the R below the knee, and 10% reduction on the L below the knee (ankle to tibial tuberosity = A-D) during Intensive Phase CDT to achieve optimal limb volume reduction  and to prevent re-accumulation of lymphatic congestion and fibrosis in the legs, to limit infection risk, to improve functional ambulation and transfers, to improve functional performance of basic and instrumental ADLs, and to limit LE progression.    Baseline Max A    Time 12    Period Weeks    Status On-going   TBA next visit. . Visibble reduction in LLE appears > 5%. RLE swelling is significantly increased today due to suspected cellulitis.     OT LONG TERM GOAL #5   Title Pt will demonstrate and sustain a least 85% compliance performing all daily LE self-care home program components daily throughout Intensive Phase CDT, including recommended skin care regime, lymphatic pumping ther ex, 23/7 compression wraps and simple self-MLD, to ensure optimal limb volume reduction, to limit infection risk and to limit LE progression.    Baseline Max A    Time 12    Period Weeks    Status Partially Met   Pt has had difficulty with compliance with compression wraps to date. He has not been able to apply wraps himself. A very helpful friend/ neighbor learned to apply wraps at last visit. She did apply them this past weekend   Target Date 03/31/21      OT LONG TERM GOAL #6   Title Using assistive devices and additional time  (modified independence)  Pt will be able to don and doff appropriate daytime compression garments and HOS devices to limit lymphatic re-accumulation and LE progression with before transitioning to self-management phase of CDT.     Baseline Max A    Time 12    Period Weeks   issue date   Status On-going                   Plan - 03/12/21 6213     Clinical Impression Statement Pt arrives with recommended compression knee highs in place bilaterally. He is now able to both don and doff stockings with modified independence using the loaned assistive [employer] "stocking butler" frame and friction gloves. Pt has ordered a device and wiol return loaned one after his arrives.  After doffing both compression stockings legs appear more reddened than has been typical this morning. Skin temperature is WNL and there are no other signs/ symptoms of infection. Provided MLD, skin care and re-applied stockings as per established protocol routine. Pt tolerated all aspects of CDT without increased pain. Recommended Pt obtain a second pair of stockings, if possible, for wash and wear convenience. Pt agrees with plan to complete Intensive Phase CDT after next session. He'll transition to Self-Management Phase and we'll support with follow-along PRN.    OT Occupational Profile and History Comprehensive Assessment- Review of records and extensive additional review of physical, cognitive, psychosocial history related to current functional performance    Occupational performance deficits (Please refer to evaluation for details): ADL's;IADL's;Work;Leisure;Social Participation    Body Structure / Function / Physical Skills ADL;Decreased knowledge of use of DME;Balance;Pain;Edema;Endurance;IADL;Scar mobility;Mobility;Decreased knowledge of precautions;Skin integrity    Rehab Potential Good    Clinical Decision Making Several treatment options, min-mod task modification necessary    Comorbidities Affecting Occupational Performance: Presence of comorbidities impacting occupational performance    Comorbidities impacting occupational performance description: CVI, CAD, hx non-pressure related leg ulcer    Modification or Assistance to Complete  Evaluation  Min-Moderate modification of tasks or assist with assess necessary to complete eval    OT Frequency 2x / week    OT Duration 12  weeks    OT Treatment/Interventions Self-care/ADL training;DME and/or AE instruction;Manual lymph drainage;Compression bandaging;Therapeutic activities;Therapeutic exercise;Scar mobilization;Other (comment);Manual Therapy;Patient/family education    Plan Complete Decongestive Therapy (CDT) to sigle limb a a time to limit fall risk and impact on function. Pt and CG will be taught to apply multilayer gradient compression wraps below knee to base of toes. CDT also to include manual lymphatic drainage, skin care, therapeutic exercise and fitting with appropriate knee length compression garments that are comfortable and Pt is able to don and doff using assistive devices PRN.    Recommended Other Services Consider fitting Pt with advanced sequential pneumatic compression device PRN.    Consulted and Agree with Plan of Care Patient;Family member/caregiver    Family Member Consulted daughter, [name]             Patient will benefit from skilled therapeutic intervention in order to improve the following deficits and impairments:   Body Structure / Function / Physical Skills: ADL, Decreased knowledge of use of DME, Balance, Pain, Edema, Endurance, IADL, Scar mobility, Mobility, Decreased knowledge of precautions, Skin integrity       Visit Diagnosis: Lymphedema, not elsewhere classified    Problem List Patient Active Problem List   Diagnosis Date Noted   Rectal bleeding 02/24/2021   Rectal irritation 11/25/2020   Lymphedema 11/19/2020   Pressure injury of right buttock, stage 2 ([place]) 04/23/2020   Acute exacerbation of CHF (congestive heart failure) ([place]) 01/13/2020   Lower limb ulcer, calf, left, limited to breakdown of skin ([place]) 12/27/2019   Dysuria 06/30/2019   Hyperglycemia 06/30/2019   LVH (left ventricular hypertrophy) due to hypertensive disease,  without heart failure 05/26/2019   Pseudophakia, left eye 11/26/2018   Venous insufficiency of both lower extremities 10/13/2018   Swelling of limb 10/12/2018   Bilateral lower extremity edema 10/10/2018   Absent pulse in lower extremity 10/10/2018   Corneal thinning of right eye 07/17/2018   Eye pain 05/31/2018   Mild aortic valve stenosis 12/03/2017   Epididymoorchitis 09/02/2017   Acute cystitis with hematuria 08/11/2017   Hydrocele, right 08/11/2017   Aortic aneurysm ([place]) 03/28/2017   Abnormal chest CT 03/28/2017   Hypotension    Dizziness 12/22/2016   Leg weakness 03/11/2016   Skin lesion 09/11/2015   Moderate mitral insufficiency 05/18/2015   Moderate tricuspid insufficiency 05/18/2015   Benign essential hypertension 04/25/2015   Rash 03/10/2015   Health care maintenance 03/10/2015   Bilateral carotid artery stenosis 08/30/2014   Atherosclerosis of both carotid arteries 69/67/8938   Chronic systolic CHF (congestive heart failure), NYHA class 3 ([place]) 07/17/2014   CAD (coronary artery disease) 06/18/2014   CHF (congestive heart failure) ([place]) 04/20/2014   Congestive heart failure ([place]) 04/20/2014   SOB (shortness of breath) 03/10/2014   Abnormal EKG 03/10/2014   Mixed hyperlipidemia 03/10/2014   Benign lipomatous neoplasm of skin and subcutaneous tissue of left arm 10/04/2013   Arm skin lesion, left 09/04/2013   History of shingles 09/04/2013   History of bladder cancer 03/05/2013   Chronic prostatitis 09/30/2012   Elevated prostate specific antigen (PSA) 09/30/2012   Family history of malignant neoplasm of prostate 09/30/2012   Gross hematuria 09/30/2012   Incomplete emptying of bladder 09/30/2012   Anemia 08/30/2012   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 [place], [place], [address] Phone: [phone]   Fax:  [phone]  Name: BREAKER SPRINGER MRN: [id number] Date of Birth: 1926-05-03

## 2021-03-13 NOTE — Patient Instructions (Signed)
Lymphedema Self- Care Instructions  1. EXERCISE: Perform lymphatic pumping there ex 2 x a day. While wearing your compression wraps or garments. Perform 10 reps of each exercise bilaterally and be sure to perform them in order. Don;t skip around!  OMIT PARTIAL SIT UP  2. MLD: Perform simple self-Manual Lymphatic Drainage (MLD) at least once a day as directed.  3. WRAPS: Compression wraps are to be worn 23 hrs/ 7 days/wk during Intensive Phase of Complete Decongestive Therapy (CDT).Building tolerance may take time and practice, so don't get discouraged. If bandages begin to feel tight during periods of inactivity and/or during the night, try performing your exercises to loosen them.   4. GARMENTS: During Management Phase CDT your compression garments are to be worn during waking hours when active. Do NOT sleep in your garments!!   5. PUT YOUR FEET UP! Elevate your feet and legs to the level of your heart whenever you are sitting down.   6. SKIN: Carefully monitor skin condition and perform impeccable hygiene daily. Bathe skin with mild soap and water and apply low pH lotion (aka Eucerin ) to improve hydration and limit infection risk.

## 2021-03-13 NOTE — Progress Notes (Signed)
Follow-Up Visit   Subjective  [name] is a 87 y.o. male who presents for the following: Actinic Keratosis (Scalp - treated with LN2 x 4), Follow-up (Lymphedema of left lower leg - Improved. His last appointment with the lymphedema clinic will be next Tuesday. He has on compression socks today.), and Other (Spots of left 4th finger, left post scalp and left buttock that are irritating).  The following portions of the chart were reviewed this encounter and updated as appropriate:   Tobacco  Allergies  Meds  Problems  Med Hx  Surg Hx  Fam Hx      Review of Systems:  No other skin or systemic complaints except as noted in HPI or Assessment and Plan.  Objective  Well appearing patient in no apparent distress; mood and affect are within normal limits.  A focused examination was performed including scalp, face,arms/hands, chest, buttocks. Relevant physical exam findings are noted in the Assessment and Plan.  Right chest x 1, scalp x 1, neck x 1, left wrist x 1 Erythematous keratotic or waxy stuck-on papule or plaque.   Scalp x 7, left hand x 1 (8) Erythematous thin papules/macules with gritty scale.   Left Dorsal Mid 4th Finger Verrucous papules -- Discussed viral etiology and contagion.   Gluteal Crease Ulcer   Assessment & Plan   Actinic Damage - chronic, secondary to cumulative UV radiation exposure/sun exposure over time - diffuse scaly erythematous macules with underlying dyspigmentation - Recommend daily broad spectrum sunscreen SPF 30+ to sun-exposed areas, reapply every 2 hours as needed.  - Recommend staying in the shade or wearing long sleeves, sun glasses (UVA+UVB protection) and wide brim hats (4-inch brim around the entire circumference of the hat). - Call for new or changing lesions.  Venous stasis dermatitis of both lower extremities Legs Chronic and persistent.  Improved from previous state.   Continue follow up with lymphedema clinic. Continue  compression socks daily.  Inflamed seborrheic keratosis Right chest x 1, scalp x 1, neck x 1, left wrist x 1 Destruction of lesion - Right chest x 1, scalp x 1, neck x 1, left wrist x 1 Complexity: simple   Destruction method: cryotherapy   Informed consent: discussed and consent obtained   Timeout:  patient name, date of birth, surgical site, and procedure verified Lesion destroyed using liquid nitrogen: Yes   Region frozen until ice ball extended beyond lesion: Yes   Outcome: patient tolerated procedure well with no complications   Post-procedure details: wound care instructions given    AK (actinic keratosis) (8) Scalp x 7, left hand x 1 Destruction of lesion - Scalp x 7, left hand x 1 Destruction method: cryotherapy   Informed consent: discussed and consent obtained   Lesion destroyed using liquid nitrogen: Yes   Region frozen until ice ball extended beyond lesion: Yes   Outcome: patient tolerated procedure well with no complications   Post-procedure details: wound care instructions given    Other viral warts Left Dorsal Mid 4th Finger Destruction of lesion - Left Dorsal Mid 4th Finger Complexity: simple   Destruction method: cryotherapy   Informed consent: discussed and consent obtained   Timeout:  patient name, date of birth, surgical site, and procedure verified Lesion destroyed using liquid nitrogen: Yes   Region frozen until ice ball extended beyond lesion: Yes   Outcome: patient tolerated procedure well with no complications   Post-procedure details: wound care instructions given    Pressure /decubitus ulcer  /  injury of left buttock, unstageable ([place]) Left buttocks Advised will be likely chronic and persistent.  It may improved and worsened over time.  Advised difficult to treat. Start Ovace Plus cream qd-bid - samples given Recheck next visit  Return in about 6 months (around 09/12/2021) for Follow up.  I, Ashok Cordia, CMA, am acting as scribe for [name], MD .  Documentation: I have reviewed the above documentation for accuracy and completeness, and I agree with the above.  [name], MD

## 2021-03-14 ENCOUNTER — Ambulatory Visit: Payer: Medicare Other | Admitting: Occupational Therapy

## 2021-03-14 DIAGNOSIS — K648 Other hemorrhoids: Secondary | ICD-10-CM | POA: Diagnosis not present

## 2021-03-19 ENCOUNTER — Ambulatory Visit: Payer: Medicare Other | Admitting: Occupational Therapy

## 2021-03-19 ENCOUNTER — Other Ambulatory Visit: Payer: Self-pay

## 2021-03-19 ENCOUNTER — Encounter: Payer: Self-pay | Admitting: Dermatology

## 2021-03-19 DIAGNOSIS — I890 Lymphedema, not elsewhere classified: Secondary | ICD-10-CM | POA: Diagnosis not present

## 2021-03-19 NOTE — Patient Instructions (Signed)

## 2021-03-19 NOTE — Therapy (Signed)
[place] MAIN [facility] SERVICES [address] [place], [place], [address] Phone: [phone]   Fax:  [phone]  Occupational Therapy Treatment Note and Progress Report: BLE Lymphedema Care  Patient Details  Name: [name] MRN: [id number] Date of Birth: Nov 15, 1925 Referring Provider (OT): [name], MD   Encounter Date: 03/19/2021   OT End of Session - 03/19/21 1112     Visit Number 19    Number of Visits 36    Date for OT Re-Evaluation 03/31/21    OT Start Time 1100    OT Stop Time 1205    OT Time Calculation (min) 65 min    Equipment Utilized During Treatment --    Activity Tolerance Patient tolerated treatment well;No increased pain    Behavior During Therapy [place] for tasks assessed/performed             Past Medical History:  Diagnosis Date   Abnormal chest CT 03/28/2017   Allergy    Anemia    Angina pectoris ([place]) 04/02/2011   Aortic aneurysm ([place]) 03/28/2017   Saw Dr Genevive Bi 04/16/17 - recommended f/u CT in 3 months.     Atherosclerosis of both carotid arteries 07/17/2014   Benign lipomatous neoplasm of skin and subcutaneous tissue of left arm 10/04/2013   Bilateral carotid artery stenosis 08/30/2014   Overview:  Less than 50% 2015   Bladder cancer ([place]) 03/05/2013   CAD (coronary artery disease) 06/18/2014   Cancer ([place]) 11-24-12   bladder. BCG treatments by Dr Jacqlyn Larsen.   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 WD SCC. Re-shaved 01/14/2016. Excised 02/05/2016, margins free.   Squamous cell carcinoma of skin 11/03/2018   Right cheek ant. to sideburn. Poorly differentiated.   Squamous cell carcinoma of skin 02/02/2019   Right mid dorsum forearm. WD SCC. EDC.    Past Surgical History:  Procedure Laterality Date   [place]    There were no vitals filed for this visit.   Subjective Assessment - 03/19/21 1112     Subjective  Mr Law Corsino presents for OT Rx visit 19/36 to address BLE lymphedema, L>R. He is unaccompanied today. Pt presents wearing recommended knee length compression stockings today. Pt returns loaned donning butler.    Patient is accompanied by: Family member   daughter, Jaziel Bennett   Pertinent History chronic BLE lymphedema (LE) and associated pain/ discomfort. Altered sensation/numbness LLE, Hx lymphorrhea, hx non-pressure related leg ulcer, stasis dermatitis, AORTIC ANEURISM, BILATERAL CAROTID STENOSIS, BLADDER CANCER 03/05/13, CAD, CHF. htn, s/p squamous cell carcinoma 2020 x 2, 20117    Limitations decreased endurance, decreased balance,  chronic leg swelling w paihn and hx skin ulcer    Repetition Increases Symptoms    Special Tests +[place] bilaterally, FOTO TBA Rx visit 1    Patient Stated Goals "get this swelling down in about 2 weeks"    Pain Onset Other (comment)   insideous onset ~ 5 yrs ago                LYMPHEDEMA/ONCOLOGY QUESTIONNAIRE - 03/19/21 1250       Lymphedema Assessments   Lymphedema Assessments Lower extremities      Right Lower Extremity Lymphedema   Other R leg limb volume from ankle to tibial tuberosity (A-D) = 3823.5 ml.    Other RLE  volume  is decreased by 20.96%. This reduction meets and exceeds the 10% reduction goal.      Left Lower Extremity Lymphedema   Other L leg limb volume from ankle to tibial tuberosity (A-D) = 3559.1 ml.    Other LLE A-D volume is decreased by 20.96% since starting OT for CDT on 01/02/21.    Other Limb volume differential (LVD) measures 6.1 %, R>L. LVD is dramatically decreased  today compared with 26.42%, R>L, initially.                     OT Treatments/Exercises (OP) - 03/19/21 1249       ADLs   ADL Education Given Yes      Manual Therapy   Manual Therapy Edema management;Manual Lymphatic Drainage (MLD)    Manual therapy comments comparative limb volumetrics- BLE below the knees (A-D)    Edema Management skin care during MLD as established    Manual Lymphatic Drainage (MLD) MLD to LLE/LLQ utilizing short neck sequence ( CLAVICULAR LN ONLY in keeping w CAROTID precautions), deep abdominal pathway, functional inguinal LN and J strokes from proximal to disial to thigh, leg and dorsal foot. Good tolerance.    Compression Bandaging Max A     to doff and done compression stockings in clinic to save time                    OT Education - 03/19/21 1256     Education Details Continued skilled Pt/caregiver education  And LE ADL training throughout visit for lymphedema self care/ home program, including compression wrapping, compression garment and device wear/care, lymphatic pumping ther ex, simple self-MLD, and skin care. Discussed progress towards goals and POC going forward.    Person(s) Educated Patient    Methods Explanation;Demonstration;Handout    Comprehension Verbalized understanding;Returned demonstration;Need further instruction                 OT Long Term Goals - 03/19/21 1235       OT LONG TERM GOAL #1   Title Given this patient's risk-adjustment variables, and is Intake Functional Status score of /100 on the FOTO tool, patient will experience at  least an increase in function of 5 points, or higher.    Baseline TBA    Time 12    Period Weeks    Status On-going   unable to assess on 03/19/21 for 19th visit progress note due to technology failure. Will assess at 3 month f/u.   Target Date 03/31/21      OT LONG TERM GOAL #2   Title Pt will be able to apply knee length, multi-layer, short stretch compression wraps to one leg using correct gradient techniques with extra time (modified independence)  decrease limb volume, to decrease  leg pain and limit infection risk, and to improve safe functional ambulation and mobility.    Baseline max a    Time 4    Period Days    Status Achieved   Max A from caregiver required     OT LONG TERM GOAL #3   Title Patient will demonstrate understanding of LE precautions, prevention strategies and cellulitis signs and symptoms by verbalizing at least 4 examples using  printed reference( modified independence)  to limit infection risk, recurrent wounds and LE progression.    Baseline Max A    Time 4    Period Days    Status Achieved      OT LONG TERM GOAL #4   Title Pt will achieve 5%, or greater,  limb volume reduction on the R below the knee, and 10% reduction on the L below the knee (ankle to tibial tuberosity = A-D) during Intensive Phase CDT to achieve optimal limb volume reduction and to prevent re-accumulation of lymphatic congestion and fibrosis in the legs, to limit infection risk, to improve functional ambulation and transfers, to improve functional performance of basic and instrumental ADLs, and to limit LE progression.    Baseline Max A    Time 12    Period Weeks    Status Partially Met   03/19/21: RLE reduction = 20.96% from ankle to popliteal (A-D). LLE volume increase = 13.98% since 01/02/21.     OT LONG TERM GOAL #5   Title Pt will demonstrate and sustain a least 85% compliance performing all daily LE self-care home program components daily throughout Intensive Phase CDT, including  recommended skin care regime, lymphatic pumping ther ex, 23/7 compression wraps and simple self-MLD, to ensure optimal limb volume reduction, to limit infection risk and to limit LE progression.    Baseline Max A    Time 12    Period Weeks    Status Achieved   Pt has had difficulty with compliance with compression wraps to date. He has not been able to apply wraps himself. A very helpful friend/ neighbor learned to apply wraps at last visit. She did apply them this past weekend     OT LONG TERM GOAL #6   Title Using assistive devices and additional time  (modified independence)  Pt will be able to don and doff appropriate daytime compression garments and HOS devices to limit lymphatic re-accumulation and LE progression with before transitioning to self-management phase of CDT.    Baseline Max A    Time 12    Period Weeks   issue date   Status Achieved                   Plan - 03/19/21 1240     Clinical Impression Statement Today is last vist of Intensive Phase CDT to address BLE LE, so we reviewed progress towards all goals and plan going forward for transition to self-management phase of care. BLE below the knee comparative limb volumetrics reveal a 20.96% limb volume reduction , and the RLE is increased in volume by 13.98% since conmmencing OT for Intensive Phase CDT on 01/02/21. Limb volume differential (LVD) measures 6.1 %, R>L. LVD is dramatically decreased  today compared with 26.42%, R>L, initially. Interestingly the LLE does not feel or look like it is excessively swollen, or congested today, but rather feels firm with good bone and muscle definition. Perhaps increased limb volume in LLE reflects some muscle mass increase as Pt's activity level has also increased since  commencing OT for CDT. Perhaps we can better assess this at 3 month folow up. Pt has met all other OT goals for lymphedema care and transitions today from clinical phase to Self Management. Because LE is a chronic,  progressive condition without a cure, Pt will benefit from periodic skilled OT follow up to assist with compression garment replacement , etc. Pt agrees with plan to return for 3 mon F/U.    OT Occupational Profile and History Comprehensive Assessment- Review of records and extensive additional review of physical, cognitive, psychosocial history related to current functional performance    Occupational performance deficits (Please refer to evaluation for details): ADL's;IADL's;Work;Leisure;Social Participation    Body Structure / Function / Physical Skills ADL;Decreased knowledge of use of DME;Balance;Pain;Edema;Endurance;IADL;Scar mobility;Mobility;Decreased knowledge of precautions;Skin integrity    Rehab Potential Good    Clinical Decision Making Several treatment options, min-mod task modification necessary    Comorbidities Affecting Occupational Performance: Presence of comorbidities impacting occupational performance    Comorbidities impacting occupational performance description: CVI, CAD, hx non-pressure related leg ulcer    Modification or Assistance to Complete Evaluation  Min-Moderate modification of tasks or assist with assess necessary to complete eval    OT Frequency 2x / week    OT Duration 12 weeks    OT Treatment/Interventions Self-care/ADL training;DME and/or AE instruction;Manual lymph drainage;Compression bandaging;Therapeutic activities;Therapeutic exercise;Scar mobilization;Other (comment);Manual Therapy;Patient/family education    Plan Complete Decongestive Therapy (CDT) to sigle limb a a time to limit fall risk and impact on function. Pt and CG will be taught to apply multilayer gradient compression wraps below knee to base of toes. CDT also to include manual lymphatic drainage, skin care, therapeutic exercise and fitting with appropriate knee length compression garments that are comfortable and Pt is able to don and doff using assistive devices PRN.    Recommended Other Services  Consider fitting Pt with advanced sequential pneumatic compression device PRN.    Consulted and Agree with Plan of Care Patient;Family member/caregiver    Family Member Consulted daughter, [name]             Patient will benefit from skilled therapeutic intervention in order to improve the following deficits and impairments:   Body Structure / Function / Physical Skills: ADL, Decreased knowledge of use of DME, Balance, Pain, Edema, Endurance, IADL, Scar mobility, Mobility, Decreased knowledge of precautions, Skin integrity       Visit Diagnosis: Lymphedema, not elsewhere classified    Problem List Patient Active Problem List   Diagnosis Date Noted   Rectal bleeding 02/24/2021   Rectal irritation 11/25/2020   Lymphedema 11/19/2020   Pressure injury of right buttock, stage 2 ([place]) 04/23/2020   Acute exacerbation of CHF (congestive heart failure) ([place]) 01/13/2020   Lower limb ulcer, calf, left, limited to breakdown of skin ([place]) 12/27/2019   Dysuria 06/30/2019   Hyperglycemia 06/30/2019   LVH (left ventricular hypertrophy) due to hypertensive disease, without heart failure 05/26/2019   Pseudophakia, left eye 11/26/2018   Venous insufficiency of both lower extremities 10/13/2018   Swelling of limb 10/12/2018   Bilateral lower extremity edema 10/10/2018   Absent pulse in lower extremity 10/10/2018   Corneal thinning of right eye 07/17/2018   Eye pain 05/31/2018   Mild aortic valve stenosis 12/03/2017   Epididymoorchitis 09/02/2017   Acute cystitis with hematuria 08/11/2017   Hydrocele, right 08/11/2017   Aortic aneurysm ([place]) 03/28/2017   Abnormal chest CT 03/28/2017   Hypotension    Dizziness 12/22/2016   Leg weakness  03/11/2016   Skin lesion 09/11/2015   Moderate mitral insufficiency 05/18/2015   Moderate tricuspid insufficiency 05/18/2015   Benign essential hypertension 04/25/2015   Rash 03/10/2015   Health care maintenance 03/10/2015   Bilateral carotid artery  stenosis 08/30/2014   Atherosclerosis of both carotid arteries 65/79/0383   Chronic systolic CHF (congestive heart failure), NYHA class 3 ([place]) 07/17/2014   CAD (coronary artery disease) 06/18/2014   CHF (congestive heart failure) ([place]) 04/20/2014   Congestive heart failure ([place]) 04/20/2014   SOB (shortness of breath) 03/10/2014   Abnormal EKG 03/10/2014   Mixed hyperlipidemia 03/10/2014   Benign lipomatous neoplasm of skin and subcutaneous tissue of left arm 10/04/2013   Arm skin lesion, left 09/04/2013   History of shingles 09/04/2013   History of bladder cancer 03/05/2013   Chronic prostatitis 09/30/2012   Elevated prostate specific antigen (PSA) 09/30/2012   Family history of malignant neoplasm of prostate 09/30/2012   Gross hematuria 09/30/2012   Incomplete emptying of bladder 09/30/2012   Anemia 08/30/2012   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

## 2021-03-21 ENCOUNTER — Encounter: Payer: Medicare Other | Admitting: Occupational Therapy

## 2021-03-26 ENCOUNTER — Encounter: Payer: Medicare Other | Admitting: Occupational Therapy

## 2021-03-28 ENCOUNTER — Encounter: Payer: Medicare Other | Admitting: Occupational Therapy

## 2021-04-04 ENCOUNTER — Encounter: Payer: Medicare Other | Admitting: Occupational Therapy

## 2021-04-04 DIAGNOSIS — K648 Other hemorrhoids: Secondary | ICD-10-CM | POA: Diagnosis not present

## 2021-04-08 ENCOUNTER — Encounter: Payer: Medicare Other | Admitting: Occupational Therapy

## 2021-04-08 ENCOUNTER — Ambulatory Visit: Payer: Medicare Other | Attending: Dermatology | Admitting: Occupational Therapy

## 2021-04-10 ENCOUNTER — Encounter: Payer: Medicare Other | Admitting: Occupational Therapy

## 2021-04-15 ENCOUNTER — Encounter: Payer: Medicare Other | Admitting: Occupational Therapy

## 2021-04-17 ENCOUNTER — Encounter: Payer: Medicare Other | Admitting: Occupational Therapy

## 2021-04-22 ENCOUNTER — Encounter: Payer: Medicare Other | Admitting: Occupational Therapy

## 2021-04-24 ENCOUNTER — Encounter: Payer: Medicare Other | Admitting: Occupational Therapy

## 2021-04-24 ENCOUNTER — Ambulatory Visit: Payer: Medicare Other | Admitting: Occupational Therapy

## 2021-04-29 ENCOUNTER — Encounter: Payer: Medicare Other | Admitting: Occupational Therapy

## 2021-04-30 ENCOUNTER — Ambulatory Visit: Payer: Medicare Other | Admitting: Occupational Therapy

## 2021-05-01 ENCOUNTER — Encounter: Payer: Medicare Other | Admitting: Occupational Therapy

## 2021-05-06 ENCOUNTER — Encounter: Payer: Medicare Other | Admitting: Occupational Therapy

## 2021-05-06 ENCOUNTER — Telehealth: Payer: Self-pay | Admitting: Internal Medicine

## 2021-05-06 DIAGNOSIS — L600 Ingrowing nail: Secondary | ICD-10-CM

## 2021-05-06 NOTE — Telephone Encounter (Signed)
Are you ok to place this referral without visit? I can place referral for you if so.

## 2021-05-06 NOTE — Telephone Encounter (Signed)
Pt called he has an ingrown toenail an would like a referral to a podiatry

## 2021-05-07 ENCOUNTER — Ambulatory Visit: Payer: Medicare Other | Admitting: Occupational Therapy

## 2021-05-07 NOTE — Telephone Encounter (Signed)
Ok to place referral, but need to know if he has a preference of which podiatrist he wants to see.

## 2021-05-07 NOTE — Addendum Note (Signed)
Addended by: Lars Masson on: 05/07/2021 01:11 PM   Modules accepted: Orders

## 2021-05-07 NOTE — Telephone Encounter (Signed)
Patient wanted to stay in [place]. Referral placed and advised someone would be contacting him with appt date and time.

## 2021-05-07 NOTE — Telephone Encounter (Signed)
LMTCB

## 2021-05-08 ENCOUNTER — Encounter: Payer: Medicare Other | Admitting: Occupational Therapy

## 2021-05-10 ENCOUNTER — Other Ambulatory Visit: Payer: Self-pay

## 2021-05-10 ENCOUNTER — Ambulatory Visit: Payer: Medicare Other | Admitting: Podiatry

## 2021-05-10 DIAGNOSIS — M79675 Pain in left toe(s): Secondary | ICD-10-CM

## 2021-05-10 DIAGNOSIS — M79674 Pain in right toe(s): Secondary | ICD-10-CM | POA: Diagnosis not present

## 2021-05-10 DIAGNOSIS — B351 Tinea unguium: Secondary | ICD-10-CM

## 2021-05-10 NOTE — Progress Notes (Signed)
   SUBJECTIVE Patient presents to office today complaining of elongated, thickened nails that cause pain while ambulating in shoes.  Patient is unable to trim their own nails. Patient is here for further evaluation and treatment.  Past Medical History:  Diagnosis Date   Abnormal chest CT 03/28/2017   Allergy    Anemia    Angina pectoris ([place]) 04/02/2011   Aortic aneurysm ([place]) 03/28/2017   Saw Dr Genevive Bi 04/16/17 - recommended f/u CT in 3 months.     Atherosclerosis of both carotid arteries 07/17/2014   Benign lipomatous neoplasm of skin and subcutaneous tissue of left arm 10/04/2013   Bilateral carotid artery stenosis 08/30/2014   Overview:  Less than 50% 2015   Bladder cancer ([place]) 03/05/2013   CAD (coronary artery disease) 06/18/2014   Cancer ([place]) 11-24-12   bladder. BCG treatments by Dr Jacqlyn Larsen.   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 WD SCC. Re-shaved 01/14/2016. Excised 02/05/2016, margins free.   Squamous cell carcinoma of skin 11/03/2018   Right cheek ant. to sideburn. Poorly differentiated.   Squamous cell carcinoma of skin 02/02/2019   Right mid dorsum forearm. WD SCC. EDC.    OBJECTIVE General Patient is awake, alert, and oriented x 3 and in no acute  distress. Derm Skin is dry and supple bilateral. Negative open lesions or macerations. Remaining integument unremarkable. Nails are tender, long, thickened and dystrophic with subungual debris, consistent with onychomycosis, 1-5 bilateral. No signs of infection noted. Vasc  DP and PT pedal pulses palpable bilaterally. Temperature gradient within normal limits.  Neuro Epicritic and protective threshold sensation grossly intact bilaterally.  Musculoskeletal Exam No symptomatic pedal deformities noted bilateral. Muscular strength within normal limits.  ASSESSMENT 1.  Pain due to onychomycosis of toenails both  PLAN OF CARE 1. Patient evaluated today.  2. Instructed to maintain good pedal hygiene and foot care.  3. Mechanical debridement of nails 1-5 bilaterally performed using a nail nipper. Filed with dremel without incident.  4. Return to clinic in 3 mos.    Edrick Kins, DPM [facility]  Dr. Edrick Kins, DPM    [address], [place] [address]                Office [phone]  Fax [phone]

## 2021-05-13 ENCOUNTER — Encounter: Payer: Medicare Other | Admitting: Occupational Therapy

## 2021-05-14 ENCOUNTER — Encounter: Payer: Medicare Other | Admitting: Occupational Therapy

## 2021-05-15 ENCOUNTER — Encounter: Payer: Medicare Other | Admitting: Occupational Therapy

## 2021-05-16 ENCOUNTER — Other Ambulatory Visit: Payer: Self-pay

## 2021-05-16 ENCOUNTER — Ambulatory Visit: Payer: Medicare Other | Admitting: Dermatology

## 2021-05-16 DIAGNOSIS — L89320 Pressure ulcer of left buttock, unstageable: Secondary | ICD-10-CM | POA: Diagnosis not present

## 2021-05-16 DIAGNOSIS — L0291 Cutaneous abscess, unspecified: Secondary | ICD-10-CM

## 2021-05-16 DIAGNOSIS — L720 Epidermal cyst: Secondary | ICD-10-CM | POA: Diagnosis not present

## 2021-05-16 DIAGNOSIS — L0211 Cutaneous abscess of neck: Secondary | ICD-10-CM | POA: Diagnosis not present

## 2021-05-16 DIAGNOSIS — L570 Actinic keratosis: Secondary | ICD-10-CM | POA: Diagnosis not present

## 2021-05-16 NOTE — Progress Notes (Signed)
   Follow-Up Visit   Subjective  [name] is a 87 y.o. male who presents for the following: Spot check (Pt c/o a spot on his face and left neck that he would like checked today. He also wants to discuss what he is using for the decubitus ulcer on his left buttocks. ).  The following portions of the chart were reviewed this encounter and updated as appropriate:  Tobacco  Allergies  Meds  Problems  Med Hx  Surg Hx  Fam Hx     Review of Systems: No other skin or systemic complaints except as noted in HPI or Assessment and Plan.  Objective  Well appearing patient in no apparent distress; mood and affect are within normal limits.  A focused examination was performed including face, scalp, neck, buttocks. Relevant physical exam findings are noted in the Assessment and Plan.  left anterior neck 1.5 cm firm subcutaneous papule  face and scalp x 15 (15) Erythematous thin papules/macules with gritty scale.   left neck Resolving abscess  Assessment & Plan  Epidermal cyst left anterior neck Discussed options for treatment being leaving it alone or scheduling in office excision.  Pt will schedule surgery.   AK (actinic keratosis) (15) face and scalp x 15 Actinic keratoses are precancerous spots that appear secondary to cumulative UV radiation exposure/sun exposure over time. They are chronic with expected duration over 1 year. A portion of actinic keratoses will progress to squamous cell carcinoma of the skin. It is not possible to reliably predict which spots will progress to skin cancer and so treatment is recommended to prevent development of skin cancer.  Recommend daily broad spectrum sunscreen SPF 30+ to sun-exposed areas, reapply every 2 hours as needed.  Recommend staying in the shade or wearing long sleeves, sun glasses (UVA+UVB protection) and wide brim hats (4-inch brim around the entire circumference of the hat). Call for new or changing lesions.  Prior to  procedure, discussed risks of blister formation, small wound, skin dyspigmentation, or rare scar following cryotherapy. Recommend Vaseline ointment to treated areas while healing.  Destruction of lesion - face and scalp x 15 Complexity: simple   Destruction method: cryotherapy   Informed consent: discussed and consent obtained   Timeout:  patient name, date of birth, surgical site, and procedure verified Lesion destroyed using liquid nitrogen: Yes   Region frozen until ice ball extended beyond lesion: Yes   Outcome: patient tolerated procedure well with no complications   Post-procedure details: wound care instructions given    Abscess left neck No treatment needed.  Already drained - resolved.  Pressure injury of left buttock, unstageable (HCC) left buttocks Chronic and persistent.  Difficult to treat. Tried to get up on feet and walk a lot more Continue [place].   Return for first avail surgery for cyst on neck.  IHarriett Sine, CMA, am acting as scribe for [name], MD. Documentation: I have reviewed the above documentation for accuracy and completeness, and I agree with the above.  [name], MD

## 2021-05-20 ENCOUNTER — Encounter: Payer: Self-pay | Admitting: Dermatology

## 2021-05-21 ENCOUNTER — Encounter: Payer: Medicare Other | Admitting: Occupational Therapy

## 2021-05-23 ENCOUNTER — Encounter: Payer: Medicare Other | Admitting: Occupational Therapy

## 2021-05-27 ENCOUNTER — Other Ambulatory Visit: Payer: Self-pay

## 2021-05-27 ENCOUNTER — Ambulatory Visit (INDEPENDENT_AMBULATORY_CARE_PROVIDER_SITE_OTHER): Payer: Medicare Other | Admitting: Internal Medicine

## 2021-05-27 ENCOUNTER — Ambulatory Visit: Payer: Medicare Other | Admitting: Internal Medicine

## 2021-05-27 DIAGNOSIS — I712 Thoracic aortic aneurysm, without rupture, unspecified: Secondary | ICD-10-CM

## 2021-05-27 DIAGNOSIS — I5022 Chronic systolic (congestive) heart failure: Secondary | ICD-10-CM | POA: Diagnosis not present

## 2021-05-27 DIAGNOSIS — I890 Lymphedema, not elsewhere classified: Secondary | ICD-10-CM

## 2021-05-27 DIAGNOSIS — E7800 Pure hypercholesterolemia, unspecified: Secondary | ICD-10-CM

## 2021-05-27 DIAGNOSIS — G7080 Lambert-Eaton syndrome, unspecified: Secondary | ICD-10-CM

## 2021-05-27 DIAGNOSIS — I10 Essential (primary) hypertension: Secondary | ICD-10-CM | POA: Diagnosis not present

## 2021-05-27 DIAGNOSIS — I2510 Atherosclerotic heart disease of native coronary artery without angina pectoris: Secondary | ICD-10-CM

## 2021-05-27 DIAGNOSIS — K6289 Other specified diseases of anus and rectum: Secondary | ICD-10-CM

## 2021-05-27 DIAGNOSIS — R739 Hyperglycemia, unspecified: Secondary | ICD-10-CM

## 2021-05-27 DIAGNOSIS — K625 Hemorrhage of anus and rectum: Secondary | ICD-10-CM

## 2021-05-27 DIAGNOSIS — G7000 Myasthenia gravis without (acute) exacerbation: Secondary | ICD-10-CM

## 2021-05-27 DIAGNOSIS — Z8551 Personal history of malignant neoplasm of bladder: Secondary | ICD-10-CM

## 2021-05-27 DIAGNOSIS — D649 Anemia, unspecified: Secondary | ICD-10-CM

## 2021-05-27 NOTE — Progress Notes (Signed)
Patient ID: [name], male   DOB: 10/08/1925, 87 y.o.   MRN: [id number]   Virtual Visit via telephone Note  This visit type was conducted due to national recommendations for restrictions regarding the COVID-19 pandemic (e.g. social distancing).  This format is felt to be most appropriate for this patient at this time.  All issues noted in this document were discussed and addressed.  No physical exam was performed (except for noted visual exam findings with Video Visits).   I connected with [name] by a video enabled telemedicine application and verified that I am speaking with the correct person using two identifiers. Location patient: home Location provider: work  Persons participating in the telephone visit: patient, provider  The limitations, risks, security and privacy concerns of performing an evaluation and management service by telephone and the availability of in person appointments have been discussed.  It has also been discussed with the patient that there may be a patient responsible charge related to this service. The patient expressed understanding and agreed to proceed.   Reason for visit: follow up appt  HPI: F/u regarding hypertension, history of CHF and hypercholesterolemia.  He reports he is doing relatively well.  He has cut back on sweets.  Weight is down some.  No chest pain.  Breathing overall stable.  Has known lymphedema.  Wearing hose - daily.  Swelling better. No acid reflux reported. No abdominal pain.  Saw Dr Byrnett.  Was given suppositories.  Rectal irritation/pain helped.  No further bleeding.  Bowels moving.  He is weighing.  Taking lasix and adjusts dose as needed.  Most days - taking lasix 40mg q day.     ROS: See pertinent positives and negatives per HPI.  Past Medical History:  Diagnosis Date   Abnormal chest CT 03/28/2017   Allergy    Anemia    Angina pectoris (HCC) 04/02/2011   Aortic aneurysm (HCC) 03/28/2017   Saw Dr Oaks 04/16/17 -  recommended f/u CT in 3 months.     Atherosclerosis of both carotid arteries 07/17/2014   Benign lipomatous neoplasm of skin and subcutaneous tissue of left arm 10/04/2013   Bilateral carotid artery stenosis 08/30/2014   Overview:  Less than 50% 2015   Bladder cancer (HCC) 03/05/2013   CAD (coronary artery disease) 06/18/2014   Cancer (HCC) 11-24-12   bladder. BCG treatments by Dr Cope.   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 WD SCC. Re-shaved 01/14/2016. Excised 02/05/2016, margins free.   Squamous cell carcinoma of skin 11/03/2018   Right cheek ant. to sideburn. Poorly differentiated.   Squamous cell carcinoma of skin 02/02/2019   Right mid dorsum forearm. WD SCC. EDC.    Past Surgical History:  Procedure   Laterality Date   APPENDECTOMY  1958   CHOLECYSTECTOMY  1979   HERNIA REPAIR  1970   TONSILLECTOMY  1958    Family History  Problem Relation Age of Onset   Lung cancer Sister    Prostate cancer Brother    Lung cancer Brother    Arthritis Other        parent   Colon cancer Neg Hx     SOCIAL HX: reviewed.    Current Outpatient Medications:    aspirin 81 MG tablet, Take 81 mg by mouth daily., Disp: , Rfl:    finasteride (PROSCAR) 5 MG  tablet, Take 5 mg by mouth daily., Disp: , Rfl:    furosemide (LASIX) 40 MG tablet, Take 1 tablet (40 mg total) by mouth daily., Disp: 90 tablet, Rfl: 3   hydroxypropyl methylcellulose / hypromellose (ISOPTO TEARS / GONIOVISC) 2.5 % ophthalmic solution, Place 1 drop into both eyes daily., Disp: , Rfl:    mometasone (ELOCON) 0.1 % cream, Apply to lower leg after showering, prior to wrapping., Disp: 45 g, Rfl: 1   mupirocin ointment (BACTROBAN) 2 %, Apply to open sores prior to wrapping., Disp: 22 g, Rfl: 1   mycophenolate (CELLCEPT) 250 MG capsule, Take 1,000 mg by mouth 2 (two) times daily. , Disp: , Rfl:    pravastatin (PRAVACHOL) 20 MG tablet, Take 1 tablet (20 mg total) by mouth daily., Disp: 90 tablet, Rfl: 1   prednisoLONE acetate (PRED FORTE) 1 % ophthalmic suspension, Place 1 drop into the right eye daily. Per patient., Disp: 5 mL, Rfl: 0   pyridostigmine (MESTINON) 60 MG tablet, Take 30 mg by mouth 6 (six) times daily. , Disp: , Rfl:    tamsulosin (FLOMAX) 0.4 MG CAPS, Take 0.4 mg by mouth daily., Disp: , Rfl:   EXAM:  GENERAL: alert, oriented, appears well and in no acute distress  PSYCH/NEURO: pleasant and cooperative, no obvious depression or anxiety, speech and thought processing grossly intact  ASSESSMENT AND PLAN:  Discussed the following assessment and plan:  Problem List Items Addressed This Visit     Anemia    Follow cbc.       Aortic aneurysm (HCC)    Previously evaluated by Dr Oaks.  Stable.  Recommended f/u prn.       CAD (coronary artery disease)    Continue risk factor modification.  Stable.       Chronic systolic CHF (congestive heart failure), NYHA class 3 (HCC)    Last ECHO EF 30%.  Off coreg and farxiga.  Remains on lasix and lisinopril.  Weight stable.  Breathing stable.  Follow weights.       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HCC)    Has been followed by neurology.  Stable.       History of bladder cancer    Has been followed by urology.         Hypercholesterolemia    Continue pravastatin.  Low cholesterol diet and exercise.  Follow lipid panel and liver function tests.        Hyperglycemia    Low carb diet and exercise.  Follow met b and a1c.       Hypertension, benign    Remains on lasix and lisinopril.  Pressures as outlined. Follow pressures.  Follow metabolic panel.        Lymphedema    Evaluated - lymphedema clinic.  Wearing compression hose.  Improved.  Follow.        Myasthenia gravis without exacerbation (HCC)      Has been followed by neurology.  Stable.        Rectal bleeding    Saw surgery.  Treated with suppositories.  No further bleeding.  Follow.       Rectal irritation    Resolved with suppositories.  Follow.         Return in about 3 months (around 08/27/2021) for follow up appt (30min).   I discussed the assessment and treatment plan with the patient. The patient was provided an opportunity to ask questions and all were answered. The patient agreed with the plan and demonstrated an understanding of the instructions.   The patient was advised to call back or seek an in-person evaluation if the symptoms worsen or if the condition fails to improve as anticipated.  I provided 23 minutes of non-face-to-face time during this encounter.   Charlene Scott, MD   

## 2021-05-28 ENCOUNTER — Encounter: Payer: Medicare Other | Admitting: Occupational Therapy

## 2021-05-30 ENCOUNTER — Encounter: Payer: Medicare Other | Admitting: Occupational Therapy

## 2021-06-01 ENCOUNTER — Encounter: Payer: Self-pay | Admitting: Internal Medicine

## 2021-06-01 NOTE — Assessment & Plan Note (Signed)
Has been followed by neurology.  Stable.  

## 2021-06-01 NOTE — Assessment & Plan Note (Signed)
Resolved with suppositories.  Follow.

## 2021-06-01 NOTE — Assessment & Plan Note (Signed)
Last ECHO EF 30%.  Off coreg and farxiga.  Remains on lasix and lisinopril.  Weight stable.  Breathing stable.  Follow weights.

## 2021-06-01 NOTE — Assessment & Plan Note (Signed)
Evaluated - lymphedema clinic.  Wearing compression hose.  Improved.  Follow.

## 2021-06-01 NOTE — Assessment & Plan Note (Signed)
Has been followed by urology.   

## 2021-06-01 NOTE — Assessment & Plan Note (Signed)
Follow cbc.  

## 2021-06-01 NOTE — Assessment & Plan Note (Signed)
Continue risk factor modification.  Stable.

## 2021-06-01 NOTE — Assessment & Plan Note (Signed)
Saw surgery.  Treated with suppositories.  No further bleeding.  Follow.

## 2021-06-01 NOTE — Assessment & Plan Note (Signed)
Remains on lasix and lisinopril.  Pressures as outlined. Follow pressures.  Follow metabolic panel.

## 2021-06-01 NOTE — Assessment & Plan Note (Signed)
Previously evaluated by Dr Oaks.  Stable.  Recommended f/u prn.  

## 2021-06-01 NOTE — Assessment & Plan Note (Signed)
Continue pravastatin.  Low cholesterol diet and exercise.  Follow lipid panel and liver function tests.   

## 2021-06-01 NOTE — Assessment & Plan Note (Signed)
Low carb diet and exercise.  Follow met b and a1c.  

## 2021-06-03 NOTE — Progress Notes (Signed)
Patient ID: [name], male    DOB: 31-Dec-1925, 87 y.o.   MRN: [id number]   Mr Winnett is a 87 y/o male with a history of HTN, hyperlipidemia, CAD, carotid disease, bladder cancer, anemia, Lambert-Eaton syndrome, myasthenia gravis, previous tobacco use and chronic heart failure.   Echo report from 01/15/20 reviewed and showed an EF of 30-35% along with moderately elevated PA pressure, mild MR, mild AS and moderate TR.  Catheterization done 04/06/14.  Has not been admitted or been in the ED in the last 6 months.   He presents today for a follow-up visit with a chief complaint of minimal shortness of breath upon moderate exertion. He describes this as chronic in nature having been present for several years. He has associated fatigue, pedal edema and weight loss along with this. He denies any difficulty sleeping, abdominal distention, palpitations, chest pain, dizziness, cough or weight gain.   Says that he's been wearing compression socks daily which does help with the swelling.   Past Medical History:  Diagnosis Date   Abnormal chest CT 03/28/2017   Allergy    Anemia    Angina pectoris ([place]) 04/02/2011   Aortic aneurysm ([place]) 03/28/2017   Saw Dr Genevive Bi 04/16/17 - recommended f/u CT in 3 months.     Atherosclerosis of both carotid arteries 07/17/2014   Benign lipomatous neoplasm of skin and subcutaneous tissue of left arm 10/04/2013   Bilateral carotid artery stenosis 08/30/2014   Overview:  Less than 50% 2015   Bladder cancer ([place]) 03/05/2013   CAD (coronary artery disease) 06/18/2014   Cancer ([place]) 11-24-12   bladder. BCG treatments by Dr Jacqlyn Larsen.   Chicken pox    Chronic prostatitis 09/30/2012   Colon polyp    Congestive heart failure ([place]) 04/20/2014   Overview:  Overview:  With anterior mi and moderate lv dyfunction ef 35%   Dizziness 12/22/2016   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[place]) 08/30/2012   Eaton-Lambert syndrome (HCC)    Elevated prostate specific antigen (PSA) 09/30/2012   Essential  hypertension, benign    Gross hematuria 09/30/2012   Herpes zoster 11/28/2011   Overview:  with ocular involvement OD   Hypercholesterolemia    Hypertension, benign 04/02/2011   Hypotension    Moderate mitral insufficiency 05/18/2015   Moderate tricuspid insufficiency 05/18/2015   Myasthenia gravis ([place])    Myasthenia gravis without exacerbation ([place]) 03/31/2011   Shingles 2013   Squamous cell carcinoma of skin 11/22/2015   Left cheek. WD SCC. Re-shaved 01/14/2016. Excised 02/05/2016, margins free.   Squamous cell carcinoma of skin 11/03/2018   Right cheek ant. to sideburn. Poorly differentiated.   Squamous cell carcinoma of skin 02/02/2019   Right mid dorsum forearm. WD SCC. EDC.   Past Surgical History:  Procedure Laterality Date   [place]   Family History  Problem Relation Age of Onset   Lung cancer Sister    Prostate cancer Brother    Lung cancer Brother    Arthritis Other        parent   Colon cancer Neg Hx    Social History   Tobacco Use   Smoking status: Former    Types: Cigarettes    Quit date: 09/29/1958    Years since quitting: 62.7   Smokeless tobacco: Never  Substance Use Topics   Alcohol use: Yes    Alcohol/week: 0.0 standard drinks  Comment: occasionally   Allergies  Allergen Reactions   Beta Adrenergic Blockers Other (See Comments)    Beta Blocker will precipitate acute weakness in Lambert-Eaton Syndrome or Myasthenia Gravis   Calcium Channel Blockers Other (See Comments)    Calcium Channel Blocker- will precipitate acute weakness in Lambert-Eaton Syndrome.   Ciprofloxacin Other (See Comments)    Last resort due to Myasthia Gravis   Sulfa Antibiotics Rash   Prior to Admission medications   Medication Sig Start Date End Date Taking? Authorizing Provider  aspirin 81 MG tablet Take 81 mg by mouth daily.   Yes [provider]  finasteride (PROSCAR) 5 MG tablet Take 5 mg  by mouth daily.   Yes [provider]  furosemide (LASIX) 40 MG tablet Take 1 tablet (40 mg total) by mouth daily. 04/03/20 06/04/21 Yes Kaymen Adrian, Otila Kluver A, FNP  hydroxypropyl methylcellulose / hypromellose (ISOPTO TEARS / GONIOVISC) 2.5 % ophthalmic solution Place 1 drop into both eyes daily.   Yes [provider]  mometasone (ELOCON) 0.1 % cream Apply to lower leg after showering, prior to wrapping. 02/05/21  Yes [name], MD  mycophenolate (CELLCEPT) 250 MG capsule Take 1,000 mg by mouth 2 (two) times daily.    Yes [provider]  pravastatin (PRAVACHOL) 20 MG tablet Take 1 tablet (20 mg total) by mouth daily. 07/27/14  Yes [name], MD  prednisoLONE acetate (PRED FORTE) 1 % ophthalmic suspension Place 1 drop into the right eye daily. Per patient. 01/15/20  Yes Enzo Bi, MD  pyridostigmine (MESTINON) 60 MG tablet Take 30 mg by mouth 6 (six) times daily.    Yes [provider]  tamsulosin (FLOMAX) 0.4 MG CAPS Take 0.4 mg by mouth daily.   Yes [provider]    Review of Systems  Constitutional:  Positive for fatigue. Negative for appetite change.  HENT:  Negative for congestion, rhinorrhea and sore throat.   Eyes: Negative.   Respiratory:  Positive for shortness of breath (with moderate exertion). Negative for cough and wheezing.   Cardiovascular:  Positive for leg swelling. Negative for chest pain and palpitations.  Gastrointestinal:  Negative for abdominal distention and abdominal pain.  Endocrine: Negative.   Genitourinary: Negative.   Musculoskeletal:  Negative for arthralgias, back pain and neck pain.  Skin: Negative.   Allergic/Immunologic: Negative.   Neurological:  Negative for dizziness and light-headedness.  Hematological:  Negative for adenopathy. Does not bruise/bleed easily.  Psychiatric/Behavioral:  Negative for dysphoric mood and sleep disturbance (sleeping on 1-2 pillows). The patient is not nervous/anxious.    Vitals:    06/04/21 1017  BP: 138/60  Pulse: 72  Resp: 16  SpO2: 98%  Weight: 198 lb 6 oz (90 kg)  Height: '5\' 10"'$  (1.778 m)   Wt Readings from Last 3 Encounters:  06/04/21 198 lb 6 oz (90 kg)  05/27/21 193 lb 12.8 oz (87.9 kg)  02/22/21 205 lb (93 kg)   Lab Results  Component Value Date   CREATININE 0.93 02/22/2021   CREATININE 1.11 11/19/2020   CREATININE 1.13 11/15/2020   Physical Exam Vitals and nursing note reviewed.  Constitutional:      Appearance: Normal appearance.  HENT:     Head: Normocephalic and atraumatic.  Cardiovascular:     Rate and Rhythm: Normal rate and regular rhythm.  Pulmonary:     Effort: Pulmonary effort is normal. No respiratory distress.     Breath sounds: No wheezing or rales.  Abdominal:     General: There  is no distension.     Palpations: Abdomen is soft.  Musculoskeletal:        General: No tenderness.     Cervical back: Normal range of motion and neck supple.     Right lower leg: Edema (1+ pitting) present.     Left lower leg: Edema (2+ pitting) present.  Skin:    General: Skin is warm and dry.  Neurological:     General: No focal deficit present.     Mental Status: He is alert and oriented to person, place, and time.  Psychiatric:        Mood and Affect: Mood normal.        Behavior: Behavior normal.        Thought Content: Thought content normal.    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   Due to HF stability, will not make a return appointment for patient at this time. Advised patient to continue close follow-up with cardiology and PCP but that if he had any questions/problems, he could always feel free to call us back and patient was comfortable with this plan.

## 2021-06-04 ENCOUNTER — Ambulatory Visit: Payer: Medicare Other | Attending: Family | Admitting: Family

## 2021-06-04 ENCOUNTER — Other Ambulatory Visit: Payer: Self-pay

## 2021-06-04 ENCOUNTER — Encounter: Payer: Self-pay | Admitting: Family

## 2021-06-04 ENCOUNTER — Encounter: Payer: Medicare Other | Admitting: Occupational Therapy

## 2021-06-04 VITALS — BP 138/60 | HR 72 | Resp 16 | Ht 70.0 in | Wt 198.4 lb

## 2021-06-04 DIAGNOSIS — Z87891 Personal history of nicotine dependence: Secondary | ICD-10-CM | POA: Insufficient documentation

## 2021-06-04 DIAGNOSIS — R5383 Other fatigue: Secondary | ICD-10-CM | POA: Insufficient documentation

## 2021-06-04 DIAGNOSIS — I110 Hypertensive heart disease with heart failure: Secondary | ICD-10-CM | POA: Diagnosis not present

## 2021-06-04 DIAGNOSIS — I5032 Chronic diastolic (congestive) heart failure: Secondary | ICD-10-CM | POA: Diagnosis not present

## 2021-06-04 DIAGNOSIS — G7080 Lambert-Eaton syndrome, unspecified: Secondary | ICD-10-CM | POA: Insufficient documentation

## 2021-06-04 DIAGNOSIS — Z882 Allergy status to sulfonamides status: Secondary | ICD-10-CM | POA: Insufficient documentation

## 2021-06-04 DIAGNOSIS — R531 Weakness: Secondary | ICD-10-CM | POA: Insufficient documentation

## 2021-06-04 DIAGNOSIS — Z9049 Acquired absence of other specified parts of digestive tract: Secondary | ICD-10-CM | POA: Insufficient documentation

## 2021-06-04 DIAGNOSIS — I5022 Chronic systolic (congestive) heart failure: Secondary | ICD-10-CM

## 2021-06-04 DIAGNOSIS — I10 Essential (primary) hypertension: Secondary | ICD-10-CM | POA: Diagnosis not present

## 2021-06-04 DIAGNOSIS — Z881 Allergy status to other antibiotic agents status: Secondary | ICD-10-CM | POA: Insufficient documentation

## 2021-06-04 DIAGNOSIS — I890 Lymphedema, not elsewhere classified: Secondary | ICD-10-CM | POA: Diagnosis not present

## 2021-06-04 NOTE — Patient Instructions (Addendum)
Continue weighing daily and call for an overnight weight gain of > 2 pounds or a weekly weight gain of >5 pounds.    Call us in the future if you need us for anything  

## 2021-06-05 ENCOUNTER — Encounter: Payer: Medicare Other | Admitting: Occupational Therapy

## 2021-06-05 DIAGNOSIS — H18421 Band keratopathy, right eye: Secondary | ICD-10-CM | POA: Diagnosis not present

## 2021-06-07 ENCOUNTER — Telehealth: Payer: Self-pay | Admitting: Internal Medicine

## 2021-06-07 NOTE — Telephone Encounter (Signed)
Tried to call patient to set up a 30mfollow up. No vm.

## 2021-06-11 ENCOUNTER — Encounter: Payer: Medicare Other | Admitting: Occupational Therapy

## 2021-06-12 ENCOUNTER — Encounter: Payer: Medicare Other | Admitting: Occupational Therapy

## 2021-06-18 ENCOUNTER — Encounter: Payer: Medicare Other | Admitting: Occupational Therapy

## 2021-06-20 DIAGNOSIS — C679 Malignant neoplasm of bladder, unspecified: Secondary | ICD-10-CM | POA: Diagnosis not present

## 2021-06-20 DIAGNOSIS — D414 Neoplasm of uncertain behavior of bladder: Secondary | ICD-10-CM | POA: Diagnosis not present

## 2021-06-25 ENCOUNTER — Encounter: Payer: Medicare Other | Admitting: Occupational Therapy

## 2021-06-25 ENCOUNTER — Ambulatory Visit: Payer: Medicare Other | Admitting: Dermatology

## 2021-06-25 ENCOUNTER — Other Ambulatory Visit: Payer: Self-pay

## 2021-06-25 DIAGNOSIS — D492 Neoplasm of unspecified behavior of bone, soft tissue, and skin: Secondary | ICD-10-CM

## 2021-06-25 DIAGNOSIS — C4441 Basal cell carcinoma of skin of scalp and neck: Secondary | ICD-10-CM | POA: Diagnosis not present

## 2021-06-25 DIAGNOSIS — C4491 Basal cell carcinoma of skin, unspecified: Secondary | ICD-10-CM

## 2021-06-25 HISTORY — DX: Basal cell carcinoma of skin, unspecified: C44.91

## 2021-06-25 MED ORDER — MUPIROCIN 2 % EX OINT: 1.0000 "application " | TOPICAL_OINTMENT | Freq: Every day | CUTANEOUS | 1 refills | Status: DC

## 2021-06-25 NOTE — Progress Notes (Signed)
   Follow-Up Visit   Subjective  [name] is a 87 y.o. male who presents for the following: Cyst (Left ant neck - Excise today).  The following portions of the chart were reviewed this encounter and updated as appropriate:   Tobacco  Allergies  Meds  Problems  Med Hx  Surg Hx  Fam Hx     Review of Systems:  No other skin or systemic complaints except as noted in HPI or Assessment and Plan.  Objective  Well appearing patient in no apparent distress; mood and affect are within normal limits.  A focused examination was performed including anterior neck. Relevant physical exam findings are noted in the Assessment and Plan.  Left ant neck Cystic pap 1.2cm   Assessment & Plan  Neoplasm of skin Left ant neck  Skin excision  Lesion length (cm):  1.2 Lesion width (cm):  1.2 Margin per side (cm):  0 Total excision diameter (cm):  1.2 Informed consent: discussed and consent obtained   Timeout: patient name, date of birth, surgical site, and procedure verified   Procedure prep:  Patient was prepped and draped in usual sterile fashion Prep type:  Isopropyl alcohol and povidone-iodine Anesthesia: the lesion was anesthetized in a standard fashion   Anesthetic:  1% lidocaine w/ epinephrine 1-100,000 buffered w/ 8.4% NaHCO3 (3cc) Instrument used comment:  #15c blade Hemostasis achieved with: pressure   Hemostasis achieved with comment:  Electrocautery Outcome: patient tolerated procedure well with no complications   Post-procedure details: sterile dressing applied and wound care instructions given   Dressing type: bandage, bacitracin and pressure dressing (Mupirocin)    Skin repair Complexity:  Complex Final length (cm):  1.5 Reason for type of repair: reduce tension to allow closure, reduce the risk of dehiscence, infection, and necrosis, reduce subcutaneous dead space and avoid a hematoma, allow closure of the large defect, preserve normal anatomy, preserve normal  anatomical and functional relationships and enhance both functionality and cosmetic results   Undermining: area extensively undermined   Undermining comment:  Undermining Defect 1.2cm Subcutaneous layers (deep stitches):  Suture size:  4-0 Suture type: Vicryl (polyglactin 910)   Subcutaneous suture technique: Inverted Dermal. Fine/surface layer approximation (top stitches):  Suture size:  4-0 Suture type: nylon   Stitches: simple running   Suture removal (days):  7 Hemostasis achieved with: pressure Outcome: patient tolerated procedure well with no complications   Post-procedure details: sterile dressing applied and wound care instructions given   Dressing type: bandage, pressure dressing and bacitracin (Mupirocin)    mupirocin ointment (BACTROBAN) 2 % Apply 1 application topically daily. Qd to excision site  Specimen 1 - Surgical pathology Differential Diagnosis: D48.5 Cyst vs ptjer  Check Margins: No Cystic pap 1.2cm  Cyst vs other  Start Mupirocin oint qd to excision site  Return in about 1 week (around 07/02/2021) for suture removal.  I, Othelia Pulling, RMA, am acting as scribe for [name], MD . Documentation: I have reviewed the above documentation for accuracy and completeness, and I agree with the above.  [name], MD

## 2021-06-25 NOTE — Patient Instructions (Signed)
Wound Care Instructions  On the day following your surgery, you should begin doing daily dressing changes: Remove the old dressing and discard it. Cleanse the wound gently with tap water. This may be done in the shower or by placing a wet gauze pad directly on the wound and letting it soak for several minutes. It is important to gently remove any dried blood from the wound in order to encourage healing. This may be done by gently rolling a moistened Q-tip on the dried blood. Do not pick at the wound. If the wound should start to bleed, continue cleaning the wound, then place a moist gauze pad on the wound and hold pressure for a few minutes.  Make sure you then dry the skin surrounding the wound completely or the tape will not stick to the skin. Do not use cotton balls on the wound. After the wound is clean and dry, apply the ointment gently with a Q-tip. Cut a non-stick pad to fit the size of the wound. Lay the pad flush to the wound. If the wound is draining, you may want to reinforce it with a small amount of gauze on top of the non-stick pad for a little added compression to the area. Use the tape to seal the area completely. Select from the following with respect to your individual situation: If your wound has been stitched closed: continue the above steps 1-8 at least daily until your sutures are removed. If your wound has been left open to heal: continue steps 1-8 at least daily for the first 3-4 weeks. We would like for you to take a few extra precautions for at least the next week. Sleep with your head elevated on pillows if our wound is on your head. Do not bend over or lift heavy items to reduce the chance of elevated blood pressure to the wound Do not participate in particularly strenuous activities.   Below is a list of dressing supplies you might need.  Cotton-tipped applicators - Q-tips Gauze pads (2x2 and/or 4x4) - All-Purpose Sponges Non-stick dressing material - Telfa Tape -  Paper or Hypafix New and clean tube of petroleum jelly - Vaseline    Comments on Post-Operative Period Slight swelling and redness often appear around the wound. This is normal and will disappear within several days following the surgery. The healing wound will drain a brownish-red-yellow discharge during healing. This is a normal phase of wound healing. As the wound begins to heal, the drainage may increase in amount. Again, this drainage is normal. Notify us if the drainage becomes persistently bloody, excessively swollen, or intensely painful or develops a foul odor or red streaks.  If you should experience mild discomfort during the healing phase, you may take an aspirin-free medication such as Tylenol (acetaminophen). Notify us if the discomfort is severe or persistent. Avoid alcoholic beverages when taking pain medicine.  In Case of Wound Hemorrhage A wound hemorrhage is when the bandage suddenly becomes soaked with bright red blood and flows profusely. If this happens, sit down or lie down with your head elevated. If the wound has a dressing on it, do not remove the dressing. Apply pressure to the existing gauze. If the wound is not covered, use a gauze pad to apply pressure and continue applying the pressure for 20 minutes without peeking. DO NOT COVER THE WOUND WITH A LARGE TOWEL OR WASH CLOTH. Release your hand from the wound site but do not remove the dressing. If the bleeding has stopped,   gently clean around the wound. Leave the dressing in place for 24 hours if possible. This wait time allows the blood vessels to close off so that you do not spark a new round of bleeding by disrupting the newly clotted blood vessels with an immediate dressing change. If the bleeding does not subside, continue to hold pressure. If matters are out of your control, contact an After Hours clinic or go to the Emergency Room.  If you have any questions or concerns for your doctor, please call our main line at  [phone] and press option 4 to reach your doctor's medical assistant. If no one answers, please leave a voicemail as directed and we will return your call as soon as possible. Messages left after 4 pm will be answered the following business day.   You may also send [place] a message via [place]. We typically respond to MyChart messages within 1-2 business days.  For prescription refills, please ask your pharmacy to contact our office. Our fax number is [phone].  If you have an urgent issue when the clinic is closed that cannot wait until the next business day, you can page your doctor at the number below.    Please note that while we do our best to be available for urgent issues outside of office hours, we are not available 24/7.   If you have an urgent issue and are unable to reach [place], you may choose to seek medical care at your doctor's office, retail clinic, urgent care center, or emergency room.  If you have a medical emergency, please immediately call 911 or go to the emergency department.  Pager Numbers  - Dr. Nehemiah Massed: [phone]  - Dr. Laurence Ferrari: [phone]  - Dr. Nicole Kindred: [phone]  In the event of inclement weather, please call our main line at [phone] for an update on the status of any delays or closures.  Dermatology Medication Tips: Please keep the boxes that topical medications come in in order to help keep track of the instructions about where and how to use these. Pharmacies typically print the medication instructions only on the boxes and not directly on the medication tubes.   If your medication is too expensive, please contact our office at [phone] option 4 or send [place] a message through [place].   We are unable to tell what your co-pay for medications will be in advance as this is different depending on your insurance coverage. However, we may be able to find a substitute medication at lower cost or fill out paperwork to get insurance to cover a needed  medication.   If a prior authorization is required to get your medication covered by your insurance company, please allow [place] 1-2 business days to complete this process.  Drug prices often vary depending on where the prescription is filled and some pharmacies may offer cheaper prices.  The website www.goodrx.com contains coupons for medications through different pharmacies. The prices here do not account for what the cost may be with help from insurance (it may be cheaper with your insurance), but the website can give you the price if you did not use any insurance.  - You can print the associated coupon and take it with your prescription to the pharmacy.  - You may also stop by our office during regular business hours and pick up a GoodRx coupon card.  - If you need your prescription sent electronically to a different pharmacy, notify our office through [facility] or by phone at [phone]  option 4.

## 2021-06-26 ENCOUNTER — Telehealth: Payer: Self-pay

## 2021-06-26 NOTE — Telephone Encounter (Signed)
Patient doing fine after yesterdays surgery./sh 

## 2021-06-27 ENCOUNTER — Encounter: Payer: Self-pay | Admitting: Dermatology

## 2021-07-02 ENCOUNTER — Encounter: Payer: Self-pay | Admitting: Dermatology

## 2021-07-02 ENCOUNTER — Other Ambulatory Visit: Payer: Self-pay

## 2021-07-02 ENCOUNTER — Ambulatory Visit (INDEPENDENT_AMBULATORY_CARE_PROVIDER_SITE_OTHER): Payer: Medicare Other | Admitting: Dermatology

## 2021-07-02 DIAGNOSIS — L820 Inflamed seborrheic keratosis: Secondary | ICD-10-CM | POA: Diagnosis not present

## 2021-07-02 DIAGNOSIS — L821 Other seborrheic keratosis: Secondary | ICD-10-CM

## 2021-07-02 DIAGNOSIS — L570 Actinic keratosis: Secondary | ICD-10-CM | POA: Diagnosis not present

## 2021-07-02 DIAGNOSIS — Z85828 Personal history of other malignant neoplasm of skin: Secondary | ICD-10-CM

## 2021-07-02 DIAGNOSIS — L578 Other skin changes due to chronic exposure to nonionizing radiation: Secondary | ICD-10-CM

## 2021-07-02 NOTE — Progress Notes (Signed)
Follow-Up Visit   Subjective  [name] is a 87 y.o. male who presents for the following: post op (Patient is here today for suture removal for pathology proven BCC - margins free) and Actinic Keratosis (Of the face and scalp - S/P LN2 ).  The following portions of the chart were reviewed this encounter and updated as appropriate:   Tobacco  Allergies  Meds  Problems  Med Hx  Surg Hx  Fam Hx     Review of Systems:  No other skin or systemic complaints except as noted in HPI or Assessment and Plan.  Objective  Well appearing patient in no apparent distress; mood and affect are within normal limits.  A focused examination was performed including the face and neck. Relevant physical exam findings are noted in the Assessment and Plan.  Face x 6 (6) Erythematous thin papules/macules with gritty scale.   face  and scalp x 6 (6) Erythematous keratotic or waxy stuck-on papule or plaque.   L ant neck Healing excision site.   Assessment & Plan  AK (actinic keratosis) (6) Face x 6  Destruction of lesion - Face x 6 Complexity: simple   Destruction method: cryotherapy   Informed consent: discussed and consent obtained   Timeout:  patient name, date of birth, surgical site, and procedure verified Lesion destroyed using liquid nitrogen: Yes   Region frozen until ice ball extended beyond lesion: Yes   Outcome: patient tolerated procedure well with no complications   Post-procedure details: wound care instructions given    Inflamed seborrheic keratosis face  and scalp x 6  Destruction of lesion - face  and scalp x 6 Complexity: simple   Destruction method: cryotherapy   Informed consent: discussed and consent obtained   Timeout:  patient name, date of birth, surgical site, and procedure verified Lesion destroyed using liquid nitrogen: Yes   Region frozen until ice ball extended beyond lesion: Yes   Outcome: patient tolerated procedure well with no complications    Post-procedure details: wound care instructions given    History of basal cell carcinoma (BCC) L ant neck  Encounter for Removal of Sutures - Incision site at the L ant neck is clean, dry and intact - Wound cleansed, sutures removed, wound cleansed and steri strips applied.  - Discussed pathology results showing a margins free basal cell carcinoma.  - Patient advised to keep steri-strips dry until they fall off. - Scars remodel for a full year. - Once steri-strips fall off, patient can apply over-the-counter silicone scar cream each night to help with scar remodeling if desired. - Patient advised to call with any concerns or if they notice any new or changing lesions.   Actinic Damage - chronic, secondary to cumulative UV radiation exposure/sun exposure over time - diffuse scaly erythematous macules with underlying dyspigmentation - Recommend daily broad spectrum sunscreen SPF 30+ to sun-exposed areas, reapply every 2 hours as needed.  - Recommend staying in the shade or wearing long sleeves, sun glasses (UVA+UVB protection) and wide brim hats (4-inch brim around the entire circumference of the hat). - Call for new or changing lesions.  Seborrheic Keratoses - Stuck-on, waxy, tan-brown papules and/or plaques  - Benign-appearing - Discussed benign etiology and prognosis. - Observe - Call for any changes  Return for appointment as scheduled.  Luther Redo, CMA, am acting as scribe for [name], MD . Documentation: I have reviewed the above documentation for accuracy and completeness, and I agree with the  above.  [name], MD

## 2021-07-02 NOTE — Patient Instructions (Signed)

## 2021-07-29 ENCOUNTER — Encounter: Payer: Self-pay | Admitting: Dermatology

## 2021-07-29 ENCOUNTER — Other Ambulatory Visit: Payer: Self-pay

## 2021-07-29 ENCOUNTER — Ambulatory Visit: Payer: Medicare Other | Admitting: Dermatology

## 2021-07-29 DIAGNOSIS — I872 Venous insufficiency (chronic) (peripheral): Secondary | ICD-10-CM | POA: Diagnosis not present

## 2021-07-29 DIAGNOSIS — L89220 Pressure ulcer of left hip, unstageable: Secondary | ICD-10-CM | POA: Diagnosis not present

## 2021-07-29 DIAGNOSIS — L89320 Pressure ulcer of left buttock, unstageable: Secondary | ICD-10-CM | POA: Diagnosis not present

## 2021-07-29 DIAGNOSIS — L814 Other melanin hyperpigmentation: Secondary | ICD-10-CM

## 2021-07-29 DIAGNOSIS — I890 Lymphedema, not elsewhere classified: Secondary | ICD-10-CM

## 2021-07-29 DIAGNOSIS — L578 Other skin changes due to chronic exposure to nonionizing radiation: Secondary | ICD-10-CM

## 2021-07-29 DIAGNOSIS — L570 Actinic keratosis: Secondary | ICD-10-CM

## 2021-07-29 DIAGNOSIS — L821 Other seborrheic keratosis: Secondary | ICD-10-CM

## 2021-07-29 DIAGNOSIS — L738 Other specified follicular disorders: Secondary | ICD-10-CM

## 2021-07-29 DIAGNOSIS — L720 Epidermal cyst: Secondary | ICD-10-CM

## 2021-07-29 DIAGNOSIS — Z85828 Personal history of other malignant neoplasm of skin: Secondary | ICD-10-CM

## 2021-07-29 DIAGNOSIS — L89300 Pressure ulcer of unspecified buttock, unstageable: Secondary | ICD-10-CM

## 2021-07-29 MED ORDER — TACROLIMUS 0.1 % EX OINT: TOPICAL_OINTMENT | Freq: Two times a day (BID) | CUTANEOUS | 2 refills | Status: DC

## 2021-07-29 NOTE — Patient Instructions (Signed)

## 2021-07-29 NOTE — Progress Notes (Signed)
   Follow-Up Visit   Subjective  [name] is a 87 y.o. male who presents for the following: Actinic Keratosis (Follow up of face treated with LN2) and Follow-up (Stasis derm with lymphedema of bilateral legs treated at lymphedema clinic).  The following portions of the chart were reviewed this encounter and updated as appropriate:   Tobacco  Allergies  Meds  Problems  Med Hx  Surg Hx  Fam Hx     Review of Systems:  No other skin or systemic complaints except as noted in HPI or Assessment and Plan.  Objective  Well appearing patient in no apparent distress; mood and affect are within normal limits.  A focused examination was performed including scalp, face, legs. Relevant physical exam findings are noted in the Assessment and Plan.  Face Yellow papules  Face Subcutaneous nodules  Scalp, face, ears (9) Erythematous thin papules/macules with gritty scale.    Assessment & Plan   History of Basal Cell Carcinoma of the Skin - No evidence of recurrence today - Recommend regular full body skin exams - Recommend daily broad spectrum sunscreen SPF 30+ to sun-exposed areas, reapply every 2 hours as needed.  - Call if any new or changing lesions are noted between office visits  Actinic Damage - chronic, secondary to cumulative UV radiation exposure/sun exposure over time - diffuse scaly erythematous macules with underlying dyspigmentation - Recommend daily broad spectrum sunscreen SPF 30+ to sun-exposed areas, reapply every 2 hours as needed.  - Recommend staying in the shade or wearing long sleeves, sun glasses (UVA+UVB protection) and wide brim hats (4-inch brim around the entire circumference of the hat). - Call for new or changing lesions.  Seborrheic Keratoses - Stuck-on, waxy, tan-brown papules and/or plaques  - Benign-appearing - Discussed benign etiology and prognosis. - Observe - Call for any changes  Sebaceous hyperplasia Face Benign  Epidermal  inclusion cyst Face Benign  AK (actinic keratosis) (9) Scalp, face, ears  Destruction of lesion - Scalp, face, ears Complexity: simple   Destruction method: cryotherapy   Informed consent: discussed and consent obtained   Timeout:  patient name, date of birth, surgical site, and procedure verified Lesion destroyed using liquid nitrogen: Yes   Region frozen until ice ball extended beyond lesion: Yes   Outcome: patient tolerated procedure well with no complications   Post-procedure details: wound care instructions given    Venous stasis dermatitis of both lower extremities Legs Stasis Dermatitis with Lymphedema - Improving. Follow-up with lymphedema clinic if worsening.  Pressure injury of buttock, unstageable, unspecified laterality (HCC) Left Hip (side) - Posterior -buttocks Chronic and persistent start Protopic oint bid  tacrolimus (PROTOPIC) 0.1 % ointment - Left Hip (side) - Posterior Apply topically 2 (two) times daily.  Return in about 6 months (around 01/26/2022). Documentation: I have reviewed the above documentation for accuracy and completeness, and I agree with the above.  [name], MD

## 2021-08-08 DIAGNOSIS — I5023 Acute on chronic systolic (congestive) heart failure: Secondary | ICD-10-CM | POA: Diagnosis not present

## 2021-08-08 DIAGNOSIS — I5022 Chronic systolic (congestive) heart failure: Secondary | ICD-10-CM | POA: Diagnosis not present

## 2021-08-08 DIAGNOSIS — I10 Essential (primary) hypertension: Secondary | ICD-10-CM | POA: Diagnosis not present

## 2021-08-08 DIAGNOSIS — I2510 Atherosclerotic heart disease of native coronary artery without angina pectoris: Secondary | ICD-10-CM | POA: Diagnosis not present

## 2021-08-14 ENCOUNTER — Other Ambulatory Visit: Payer: Self-pay

## 2021-08-14 ENCOUNTER — Ambulatory Visit (INDEPENDENT_AMBULATORY_CARE_PROVIDER_SITE_OTHER): Payer: Medicare Other | Admitting: Nurse Practitioner

## 2021-08-14 VITALS — BP 125/67 | HR 75 | Ht 70.0 in | Wt 200.0 lb

## 2021-08-14 DIAGNOSIS — I10 Essential (primary) hypertension: Secondary | ICD-10-CM | POA: Diagnosis not present

## 2021-08-14 DIAGNOSIS — E7800 Pure hypercholesterolemia, unspecified: Secondary | ICD-10-CM | POA: Diagnosis not present

## 2021-08-14 DIAGNOSIS — I890 Lymphedema, not elsewhere classified: Secondary | ICD-10-CM

## 2021-08-18 ENCOUNTER — Encounter (INDEPENDENT_AMBULATORY_CARE_PROVIDER_SITE_OTHER): Payer: Self-pay | Admitting: Nurse Practitioner

## 2021-08-18 NOTE — Progress Notes (Signed)
Subjective:    Patient ID: [name], male    DOB: 13-Jan-1926, 87 y.o.   MRN: [id number] Chief Complaint  Patient presents with   Follow-up    1 yr no studies    The patient returns to the office for followup evaluation regarding leg swelling.  The swelling has improved quite a bit and the pain associated with swelling has decreased substantially. There have not been any interval development of a ulcerations or wounds.  Since the previous visit the patient has been wearing graduated compression stockings and has noted improvement in the lymphedema. The patient has been using compression routinely morning until night.  The patient also states elevation during the day and exercise is being done too.        Review of Systems  Cardiovascular:  Positive for leg swelling.  All other systems reviewed and are negative.     Objective:   Physical Exam Vitals reviewed.  HENT:     Head: Normocephalic.  Cardiovascular:     Rate and Rhythm: Normal rate.  Pulmonary:     Effort: Pulmonary effort is normal.  Musculoskeletal:     Right lower leg: Edema present.     Left lower leg: Edema present.  Skin:    General: Skin is warm and dry.  Neurological:     Mental Status: He is alert and oriented to person, place, and time.  Psychiatric:        Mood and Affect: Mood normal.        Behavior: Behavior normal.        Thought Content: Thought content normal.        Judgment: Judgment normal.    BP 125/67   Pulse 75   Ht 5\' 10"  (1.778 m)   Wt 200 lb (90.7 kg)   BMI 28.70 kg/m   Past Medical History:  Diagnosis Date   Abnormal chest CT 03/28/2017   Allergy    Anemia    Angina pectoris ([place]) 04/02/2011   Aortic aneurysm ([place]) 03/28/2017   Saw Dr Genevive Bi 04/16/17 - recommended f/u CT in 3 months.     Atherosclerosis of both carotid arteries 07/17/2014   Benign lipomatous neoplasm of skin and subcutaneous tissue of left arm 10/04/2013   Bilateral carotid artery stenosis 08/30/2014    Overview:  Less than 50% 2015   Bladder cancer ([place]) 03/05/2013   CAD (coronary artery disease) 06/18/2014   Cancer ([place]) 11-24-12   bladder. BCG treatments by Dr Jacqlyn Larsen.   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 WD SCC. Re-shaved 01/14/2016. Excised 02/05/2016, margins free.   Squamous cell carcinoma of skin 11/03/2018   Right cheek ant. to sideburn. Poorly differentiated.   Squamous cell carcinoma  of skin 02/02/2019   Right mid dorsum forearm. WD SCC. EDC.    Social History   Socioeconomic History   Marital status: Married    Spouse name: Not on file   Number of children: Not on file   Years of education: Not on file   Highest education level: Not on file  Occupational History   Not on file  Tobacco Use   Smoking status: Former    Types: Cigarettes    Quit date: 09/29/1958    Years since quitting: 62.9   Smokeless tobacco: Never  Vaping Use   Vaping Use: Never used  Substance and Sexual Activity   Alcohol use: Yes    Alcohol/week: 0.0 standard drinks    Comment: occasionally   Drug use: No   Sexual activity: Never  Other Topics Concern   Not on file  Social History Narrative   Pt is married with 2 children - 1 son, 1 daughter. Previously  self-employed in [place] Strain: Low Risk    Difficulty of Paying Living Expenses: Not hard at all  Food Insecurity: No Food Insecurity   Worried About Charity fundraiser in the Last Year: Never true   Arboriculturist in the Last Year: Never true  Transportation Needs: No Transportation Needs   Lack of Transportation (Medical): No   Lack of Transportation (Non-Medical): No  Physical Activity: Not on file  Stress: No Stress Concern Present   Feeling of Stress : Not at all  Social Connections: Unknown   Frequency of Communication with Friends and Family: More than three times a week   Frequency of Social Gatherings with Friends and Family: More than three times a week   Attends Religious Services: Not on file   Active Member of Clubs or Organizations: Not on file   Attends Archivist Meetings: Not on file   Marital Status: Widowed  Human resources officer Violence: Not At Risk   Fear of Current or Ex-Partner: No   Emotionally Abused: No   Physically Abused: No   Sexually Abused: No    Past Surgical History:  Procedure Laterality Date   [place]    Family History  Problem Relation Age of Onset   Lung cancer Sister    Prostate cancer Brother    Lung cancer Brother    Arthritis Other        parent   Colon cancer Neg Hx     Allergies  Allergen Reactions   Beta Adrenergic Blockers Other (See Comments)    Beta Blocker will precipitate acute weakness in Lambert-Eaton Syndrome or Myasthenia Gravis   Calcium Channel Blockers Other (See Comments)    Calcium Channel Blocker- will precipitate acute weakness in Lambert-Eaton Syndrome.   Ciprofloxacin Other (See Comments)    Last resort due to Myasthia Gravis   Sulfa Antibiotics Rash    CBC Latest Ref Rng & Units 02/22/2021 11/15/2020 02/13/2020  WBC 4.0 - 10.5 K/uL 4.9 4.8 5.3   Hemoglobin 13.0 - 17.0 g/dL 13.7 13.6 13.7  Hematocrit 39.0 - 52.0 % 42.6 42.4 42.4  Platelets 150.0 - 400.0 K/uL 152.0 158.0 161.0      CMP     Component Value Date/Time   NA 141 02/22/2021 1234   K 4.0 02/22/2021 1234   CL 102 02/22/2021 1234  CO2 30 02/22/2021 1234   GLUCOSE 99 02/22/2021 1234   BUN 16 02/22/2021 1234   CREATININE 0.93 02/22/2021 1234   CALCIUM 9.2 02/22/2021 1234   PROT 6.1 02/22/2021 1234   ALBUMIN 4.1 02/22/2021 1234   AST 11 02/22/2021 1234   ALT 6 02/22/2021 1234   ALKPHOS 71 02/22/2021 1234   BILITOT 0.9 02/22/2021 1234   GFRNONAA 58 (L) 01/15/2020 0508   GFRAA >60 01/15/2020 0508     No results found.     Assessment & Plan:   1. Lymphedema No surgery or intervention at this point in time.  I have reviewed my discussion with the patient regarding venous insufficiency and why it causes symptoms. I have discussed with the patient the chronic skin changes that accompany venous insufficiency and the long term sequela such as ulceration. Patient will contnue wearing graduated compression stockings on a daily basis, as this has provided excellent control of his edema. The patient will put the stockings on first thing in the morning and removing them in the evening. The patient is reminded not to sleep in the stockings.  In addition, behavioral modification including elevation during the day will be initiated. Exercise is strongly encouraged.  Given the patient's good control and lack of any problems regarding the venous insufficiency and lymphedema a lymph pump in not need at this time.  The patient will follow up with me PRN should anything change.  The patient voices agreement with this plan.   2. Hypertension, benign Continue antihypertensive medications as already ordered, these medications have been reviewed and there are no changes at this time.   3. Hypercholesterolemia Continue statin as ordered and reviewed, no changes at this time     Current Outpatient Medications on File Prior to Visit  Medication Sig Dispense Refill   aspirin 81 MG tablet Take 81 mg by mouth daily.     finasteride (PROSCAR) 5 MG tablet Take 5 mg by mouth daily.     hydroxypropyl methylcellulose / hypromellose (ISOPTO TEARS / GONIOVISC) 2.5 % ophthalmic solution Place 1 drop into both eyes daily.     mycophenolate (CELLCEPT) 250 MG capsule Take 1,000 mg by mouth 2 (two) times daily.      pravastatin (PRAVACHOL) 20 MG tablet Take 1 tablet (20 mg total) by mouth daily. 90 tablet 1   prednisoLONE acetate (PRED FORTE) 1 % ophthalmic suspension Place 1 drop into the right eye daily. Per patient. 5 mL 0   pyridostigmine (MESTINON) 60 MG tablet Take 30 mg by mouth 6 (six) times daily.      tacrolimus (PROTOPIC) 0.1 % ointment Apply topically 2 (two) times daily. 100 g 2   tamsulosin (FLOMAX) 0.4 MG CAPS Take 0.4 mg by mouth daily.     furosemide (LASIX) 40 MG tablet Take 1 tablet (40 mg total) by mouth daily. 90 tablet 3   No current facility-administered medications on file prior to visit.    There are no Patient Instructions on file for this visit. No follow-ups on file.   Kris Hartmann, NP

## 2021-09-28 ENCOUNTER — Other Ambulatory Visit: Payer: Self-pay

## 2021-09-28 ENCOUNTER — Emergency Department: Payer: Medicare Other

## 2021-09-28 ENCOUNTER — Inpatient Hospital Stay
Admission: EM | Admit: 2021-09-28 | Discharge: 2021-10-05 | DRG: 291 | Disposition: A | Discharge status: Home Health | Payer: Medicare Other | Attending: Obstetrics and Gynecology | Admitting: Obstetrics and Gynecology

## 2021-09-28 ENCOUNTER — Encounter: Payer: Self-pay | Admitting: Emergency Medicine

## 2021-09-28 DIAGNOSIS — R0602 Shortness of breath: Secondary | ICD-10-CM

## 2021-09-28 DIAGNOSIS — I110 Hypertensive heart disease with heart failure: Secondary | ICD-10-CM | POA: Diagnosis not present

## 2021-09-28 DIAGNOSIS — I2510 Atherosclerotic heart disease of native coronary artery without angina pectoris: Secondary | ICD-10-CM | POA: Diagnosis present

## 2021-09-28 DIAGNOSIS — Z20822 Contact with and (suspected) exposure to covid-19: Secondary | ICD-10-CM | POA: Diagnosis not present

## 2021-09-28 DIAGNOSIS — G7080 Lambert-Eaton syndrome, unspecified: Secondary | ICD-10-CM | POA: Diagnosis not present

## 2021-09-28 DIAGNOSIS — G7000 Myasthenia gravis without (acute) exacerbation: Secondary | ICD-10-CM | POA: Diagnosis present

## 2021-09-28 DIAGNOSIS — R0902 Hypoxemia: Secondary | ICD-10-CM | POA: Diagnosis not present

## 2021-09-28 DIAGNOSIS — J810 Acute pulmonary edema: Secondary | ICD-10-CM

## 2021-09-28 DIAGNOSIS — Z801 Family history of malignant neoplasm of trachea, bronchus and lung: Secondary | ICD-10-CM

## 2021-09-28 DIAGNOSIS — I5043 Acute on chronic combined systolic (congestive) and diastolic (congestive) heart failure: Secondary | ICD-10-CM | POA: Diagnosis not present

## 2021-09-28 DIAGNOSIS — E782 Mixed hyperlipidemia: Secondary | ICD-10-CM | POA: Diagnosis present

## 2021-09-28 DIAGNOSIS — I517 Cardiomegaly: Secondary | ICD-10-CM

## 2021-09-28 DIAGNOSIS — J9621 Acute and chronic respiratory failure with hypoxia: Secondary | ICD-10-CM | POA: Diagnosis present

## 2021-09-28 DIAGNOSIS — T501X6A Underdosing of loop [high-ceiling] diuretics, initial encounter: Secondary | ICD-10-CM | POA: Diagnosis present

## 2021-09-28 DIAGNOSIS — Z91119 Patient's noncompliance with dietary regimen due to unspecified reason: Secondary | ICD-10-CM

## 2021-09-28 DIAGNOSIS — Z79899 Other long term (current) drug therapy: Secondary | ICD-10-CM | POA: Diagnosis not present

## 2021-09-28 DIAGNOSIS — Z79624 Long term (current) use of inhibitors of nucleotide synthesis: Secondary | ICD-10-CM

## 2021-09-28 DIAGNOSIS — Z87891 Personal history of nicotine dependence: Secondary | ICD-10-CM

## 2021-09-28 DIAGNOSIS — G7001 Myasthenia gravis with (acute) exacerbation: Secondary | ICD-10-CM | POA: Diagnosis not present

## 2021-09-28 DIAGNOSIS — J9602 Acute respiratory failure with hypercapnia: Secondary | ICD-10-CM | POA: Diagnosis not present

## 2021-09-28 DIAGNOSIS — Z7982 Long term (current) use of aspirin: Secondary | ICD-10-CM

## 2021-09-28 DIAGNOSIS — I10 Essential (primary) hypertension: Secondary | ICD-10-CM | POA: Diagnosis present

## 2021-09-28 DIAGNOSIS — I509 Heart failure, unspecified: Secondary | ICD-10-CM

## 2021-09-28 DIAGNOSIS — Z8261 Family history of arthritis: Secondary | ICD-10-CM | POA: Diagnosis not present

## 2021-09-28 DIAGNOSIS — I5033 Acute on chronic diastolic (congestive) heart failure: Secondary | ICD-10-CM | POA: Diagnosis not present

## 2021-09-28 DIAGNOSIS — J9601 Acute respiratory failure with hypoxia: Secondary | ICD-10-CM | POA: Diagnosis not present

## 2021-09-28 DIAGNOSIS — I6523 Occlusion and stenosis of bilateral carotid arteries: Secondary | ICD-10-CM | POA: Diagnosis not present

## 2021-09-28 DIAGNOSIS — Z8042 Family history of malignant neoplasm of prostate: Secondary | ICD-10-CM | POA: Diagnosis not present

## 2021-09-28 DIAGNOSIS — Z8551 Personal history of malignant neoplasm of bladder: Secondary | ICD-10-CM

## 2021-09-28 DIAGNOSIS — N400 Enlarged prostate without lower urinary tract symptoms: Secondary | ICD-10-CM | POA: Diagnosis present

## 2021-09-28 DIAGNOSIS — G928 Other toxic encephalopathy: Secondary | ICD-10-CM | POA: Diagnosis not present

## 2021-09-28 DIAGNOSIS — I5021 Acute systolic (congestive) heart failure: Secondary | ICD-10-CM | POA: Diagnosis not present

## 2021-09-28 DIAGNOSIS — I6501 Occlusion and stenosis of right vertebral artery: Secondary | ICD-10-CM | POA: Diagnosis not present

## 2021-09-28 DIAGNOSIS — R0689 Other abnormalities of breathing: Secondary | ICD-10-CM | POA: Diagnosis not present

## 2021-09-28 LAB — BASIC METABOLIC PANEL
Anion gap: 7 (ref 5–15)
BUN: 22 mg/dL (ref 8–23)
CO2: 29 mmol/L (ref 22–32)
Calcium: 8.3 mg/dL — ABNORMAL LOW (ref 8.9–10.3)
Chloride: 103 mmol/L (ref 98–111)
Creatinine, Ser: 0.95 mg/dL (ref 0.61–1.24)
GFR, Estimated: >60 mL/min (ref >60)
Glucose, Bld: 142 mg/dL — ABNORMAL HIGH (ref 70–99)
Potassium: 4.4 mmol/L (ref 3.5–5.1)
Sodium: 139 mmol/L (ref 135–145)

## 2021-09-28 LAB — CBC
HCT: 43.8 % (ref 39.0–52.0)
Hemoglobin: 13.4 g/dL (ref 13.0–17.0)
MCH: 29.4 pg (ref 26.0–34.0)
MCHC: 30.6 g/dL (ref 30.0–36.0)
MCV: 96.1 fL (ref 80.0–100.0)
Platelets: 200 10*3/uL (ref 150–400)
RBC: 4.56 MIL/uL (ref 4.22–5.81)
RDW: 14.3 % (ref 11.5–15.5)
WBC: 6.7 10*3/uL (ref 4.0–10.5)
nRBC: 0 % (ref 0.0–0.2)

## 2021-09-28 LAB — RESP PANEL BY RT-PCR (FLU A&B, COVID) ARPGX2
Influenza A by PCR: NEGATIVE
Influenza B by PCR: NEGATIVE
SARS Coronavirus 2 by RT PCR: NEGATIVE

## 2021-09-28 LAB — BRAIN NATRIURETIC PEPTIDE: B Natriuretic Peptide: 869.3 pg/mL — ABNORMAL HIGH (ref 0.0–100.0)

## 2021-09-28 MED ORDER — ASPIRIN EC 81 MG PO TBEC
81.0000 mg | DELAYED_RELEASE_TABLET | Freq: Every day | ORAL | Status: DC
  Administered 2021-09-30 – 2021-10-05 (×6): 81 mg via ORAL
  Filled 2021-09-28 (×6): qty 1

## 2021-09-28 MED ORDER — TACROLIMUS 0.1 % EX OINT: TOPICAL_OINTMENT | Freq: Two times a day (BID) | CUTANEOUS | Status: DC

## 2021-09-28 MED ORDER — ONDANSETRON HCL 4 MG PO TABS: 4.0000 mg | ORAL_TABLET | Freq: Four times a day (QID) | ORAL | Status: DC | PRN

## 2021-09-28 MED ORDER — FUROSEMIDE 10 MG/ML IJ SOLN
40.0000 mg | Freq: Two times a day (BID) | INTRAMUSCULAR | Status: DC
  Administered 2021-09-29 – 2021-09-30 (×3): 40 mg via INTRAVENOUS
  Filled 2021-09-28 (×3): qty 4

## 2021-09-28 MED ORDER — FINASTERIDE 5 MG PO TABS
5.0000 mg | ORAL_TABLET | Freq: Every day | ORAL | Status: DC
  Administered 2021-09-30 – 2021-10-05 (×6): 5 mg via ORAL
  Filled 2021-09-28 (×7): qty 1

## 2021-09-28 MED ORDER — TAMSULOSIN HCL 0.4 MG PO CAPS
0.4000 mg | ORAL_CAPSULE | Freq: Every day | ORAL | Status: DC
  Administered 2021-09-30 – 2021-10-05 (×6): 0.4 mg via ORAL
  Filled 2021-09-28 (×6): qty 1

## 2021-09-28 MED ORDER — PREDNISOLONE ACETATE 1 % OP SUSP
1.0000 [drp] | Freq: Every day | OPHTHALMIC | Status: DC
  Filled 2021-09-28: qty 5

## 2021-09-28 MED ORDER — PYRIDOSTIGMINE BROMIDE 60 MG PO TABS
30.0000 mg | ORAL_TABLET | Freq: Every day | ORAL | Status: DC
  Administered 2021-09-29 – 2021-10-05 (×35): 30 mg via ORAL
  Filled 2021-09-28 (×46): qty 0.5

## 2021-09-28 MED ORDER — ACETAMINOPHEN 325 MG PO TABS
650.0000 mg | ORAL_TABLET | Freq: Four times a day (QID) | ORAL | Status: DC | PRN
  Administered 2021-09-29: 650 mg via ORAL
  Filled 2021-09-28: qty 2

## 2021-09-28 MED ORDER — POLYETHYLENE GLYCOL 3350 17 G PO PACK: 17.0000 g | PACK | Freq: Every day | ORAL | Status: DC | PRN

## 2021-09-28 MED ORDER — ONDANSETRON HCL 4 MG/2ML IJ SOLN: 4.0000 mg | Freq: Four times a day (QID) | INTRAMUSCULAR | Status: DC | PRN

## 2021-09-28 MED ORDER — HYDRALAZINE HCL 20 MG/ML IJ SOLN: 10.0000 mg | Freq: Four times a day (QID) | INTRAMUSCULAR | Status: DC | PRN

## 2021-09-28 MED ORDER — POLYVINYL ALCOHOL 1.4 % OP SOLN
1.0000 [drp] | Freq: Every day | OPHTHALMIC | Status: DC
  Filled 2021-09-28: qty 15

## 2021-09-28 MED ORDER — FUROSEMIDE 10 MG/ML IJ SOLN
40.0000 mg | Freq: Once | INTRAMUSCULAR | Status: AC
  Administered 2021-09-28: 40 mg via INTRAVENOUS
  Filled 2021-09-28: qty 4

## 2021-09-28 MED ORDER — MYCOPHENOLATE MOFETIL 250 MG PO CAPS
1000.0000 mg | ORAL_CAPSULE | Freq: Two times a day (BID) | ORAL | Status: DC
  Administered 2021-09-29 – 2021-10-05 (×13): 1000 mg via ORAL
  Filled 2021-09-28 (×16): qty 4

## 2021-09-28 MED ORDER — ENOXAPARIN SODIUM 40 MG/0.4ML IJ SOSY
40.0000 mg | PREFILLED_SYRINGE | INTRAMUSCULAR | Status: DC
  Administered 2021-09-29 – 2021-10-04 (×6): 40 mg via SUBCUTANEOUS
  Filled 2021-09-28 (×7): qty 0.4

## 2021-09-28 MED ORDER — PRAVASTATIN SODIUM 20 MG PO TABS
20.0000 mg | ORAL_TABLET | Freq: Every day | ORAL | Status: DC
  Administered 2021-09-29 – 2021-10-04 (×6): 20 mg via ORAL
  Filled 2021-09-28 (×6): qty 1

## 2021-09-28 MED ORDER — ACETAMINOPHEN 650 MG RE SUPP: 650.0000 mg | Freq: Four times a day (QID) | RECTAL | Status: DC | PRN

## 2021-09-28 NOTE — Assessment & Plan Note (Addendum)
·   Continue home regimen of CellCept  Daughter wants the healthcare staff to be aware that her father is immunocompromised due to ongoing CellCept therapy.  I informed her that we will make every measure that we can to ensure all hygienic precautions are taken as appropriate

## 2021-09-28 NOTE — ED Provider Notes (Signed)
[facility] Emergency Department Provider Note   ____________________________________________   Event Date/Time   First MD Initiated Contact with Patient 09/28/21 1801     (approximate)  I have reviewed the triage vital signs and the nursing notes.   HISTORY  Chief Complaint Shortness of Breath  HPI [name] is a 87 y.o. male who presents for shortness of breath  LOCATION: Chest DURATION: 3 days prior to arrival TIMING: Worsening since onset SEVERITY: Severe QUALITY: Shortness of breath CONTEXT: Patient has history of CHF, CAD who presents for worsening shortness of breath over the last 3 days in the setting of increased salt intake over the holidays, increase fluid intake, and possible missed dose of his Lasix. MODIFYING FACTORS: States that any exertion worsens the shortness of breath and is partially relieved at rest and submental oxygen ASSOCIATED SYMPTOMS: Worsening bilateral lower extremity edema   Per medical record review, patient has history of of CAD, CHF, and hypertension          Past Medical History:  Diagnosis Date   Abnormal chest CT 03/28/2017   Allergy    Anemia    Angina pectoris ([place]) 04/02/2011   Aortic aneurysm ([place]) 03/28/2017   Saw Dr Genevive Bi 04/16/17 - recommended f/u CT in 3 months.     Atherosclerosis of both carotid arteries 07/17/2014   Benign lipomatous neoplasm of skin and subcutaneous tissue of left arm 10/04/2013   Bilateral carotid artery stenosis 08/30/2014   Overview:  Less than 50% 2015   Bladder cancer ([place]) 03/05/2013   CAD (coronary artery disease) 06/18/2014   Cancer ([place]) 11-24-12   bladder. BCG treatments by Dr Jacqlyn Larsen.   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 WD SCC. Re-shaved 01/14/2016. Excised 02/05/2016, margins free.   Squamous cell carcinoma of skin 11/03/2018   Right cheek ant. to sideburn. Poorly differentiated.   Squamous cell carcinoma of skin 02/02/2019   Right mid dorsum forearm. WD SCC. EDC.    Patient Active Problem List   Diagnosis Date Noted   Acute on chronic combined systolic and diastolic CHF (congestive heart failure) ([place]) 09/28/2021   Benign prostatic hyperplasia 09/28/2021   Rectal bleeding 02/24/2021   Rectal irritation 11/25/2020   Lymphedema 11/19/2020   Pressure injury of right buttock, stage 2 ([place]) 04/23/2020   Lower limb ulcer, calf, left, limited to breakdown of skin ([place]) 12/27/2019   Dysuria 06/30/2019   Hyperglycemia 06/30/2019   LVH (left ventricular hypertrophy) due to hypertensive disease, without heart failure 05/26/2019   Pseudophakia, left eye 11/26/2018   Venous insufficiency of both lower extremities 10/13/2018   Swelling of limb 10/12/2018   Bilateral lower extremity edema 10/10/2018   Absent pulse in lower extremity  10/10/2018   Corneal thinning of right eye 07/17/2018   Eye pain 05/31/2018   Mild aortic valve stenosis 12/03/2017   Epididymoorchitis 09/02/2017   Hydrocele, right 08/11/2017   Aortic aneurysm ([place]) 03/28/2017   Abnormal chest CT 03/28/2017   Acute respiratory failure with hypoxia (HCC) 12/27/2016   Hypotension    Dizziness 12/22/2016   Leg weakness 03/11/2016   Skin lesion 09/11/2015    Moderate mitral insufficiency 05/18/2015   Moderate tricuspid insufficiency 05/18/2015   Essential hypertension 04/25/2015   Rash 03/10/2015   Health care maintenance 03/10/2015   Bilateral carotid artery stenosis 08/30/2014   Atherosclerosis of both carotid arteries 50/93/2671   Chronic systolic CHF (congestive heart failure), NYHA class 3 ([place]) 07/17/2014   Coronary artery disease involving native coronary artery of native heart without angina pectoris 06/18/2014   CHF (congestive heart failure) ([place]) 04/20/2014   Congestive heart failure ([place]) 04/20/2014   SOB (shortness of breath) 03/10/2014   Abnormal EKG 03/10/2014   Mixed hyperlipidemia 03/10/2014   Benign lipomatous neoplasm of skin and subcutaneous tissue of left arm 10/04/2013   Arm skin lesion, left 09/04/2013   History of shingles 09/04/2013   History of bladder cancer 03/05/2013   Chronic prostatitis 09/30/2012   Elevated prostate specific antigen (PSA) 09/30/2012   Family history of malignant neoplasm of prostate 09/30/2012   Gross hematuria 09/30/2012   Incomplete emptying of bladder 09/30/2012   Anemia 08/30/2012   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 Authorizing Provider  aspirin 81 MG tablet Take 81 mg by mouth daily.   Yes [provider]  finasteride (PROSCAR) 5 MG tablet Take 5 mg by mouth daily.   Yes [provider]  hydroxypropyl methylcellulose / hypromellose (ISOPTO TEARS / GONIOVISC) 2.5 % ophthalmic solution Place 1 drop into both eyes daily.   Yes [provider]  mycophenolate (CELLCEPT)  250 MG capsule Take 1,000 mg by mouth 2 (two) times daily.    Yes [provider]  pravastatin (PRAVACHOL) 20 MG tablet Take 1 tablet (20 mg total) by mouth daily. 07/27/14  Yes [name], MD  prednisoLONE acetate (PRED FORTE) 1 % ophthalmic suspension Place 1 drop into the right eye daily. Per patient. 01/15/20  Yes Enzo Bi, MD  pyridostigmine (MESTINON) 60 MG tablet Take 30 mg by mouth 6 (six) times daily.    Yes [provider]  tacrolimus (PROTOPIC) 0.1 % ointment Apply topically 2 (two) times daily. 07/29/21  Yes Ralene Bathe, MD  tamsulosin (FLOMAX) 0.4 MG CAPS Take 0.4 mg by mouth daily.   Yes [provider]  furosemide (LASIX) 40 MG tablet Take 1 tablet (40 mg total) by mouth daily. 04/03/20 06/04/21  Alisa Graff, FNP    Allergies Beta adrenergic blockers, Calcium channel blockers, Ciprofloxacin, and Sulfa antibiotics  Family History  Problem Relation Age of Onset   Lung cancer Sister    Prostate cancer Brother    Lung cancer Brother    Arthritis Other        parent   Colon cancer Neg Hx     Social History Social  History   Tobacco Use   Smoking status: Former    Types: Cigarettes    Quit date: 09/29/1958    Years since quitting: 63.0   Smokeless tobacco: Never  Vaping Use   Vaping Use: Never used  Substance Use Topics   Alcohol use: Yes    Alcohol/week: 0.0 standard drinks    Comment: occasionally   Drug use: No    Review of Systems Constitutional: No fever/chills Eyes: No visual changes. ENT: No sore throat. Cardiovascular: Denies chest pain. Respiratory: Endorses shortness of breath. Gastrointestinal: No abdominal pain.  No nausea, no vomiting.  No diarrhea. Genitourinary: Negative for dysuria. Musculoskeletal: Negative for acute arthralgias Skin: Negative for rash. Neurological: Negative for headaches, weakness/numbness/paresthesias in any extremity Psychiatric: Negative for suicidal ideation/homicidal  ideation   ____________________________________________   PHYSICAL EXAM:  VITAL SIGNS: ED Triage Vitals [09/28/21 1739]  Enc Vitals Group     BP (!) 155/66     Pulse Rate 84     Resp 20     Temp 97.6 F (36.4 C)     Temp Source Oral     SpO2 98 %     Weight 206 lb (93.4 kg)     Height 5\' 10"  (1.778 m)     Head Circumference      Peak Flow      Pain Score 0     Pain Loc      Pain Edu?      Excl. in [place]?    Constitutional: Alert and oriented. Well appearing and in no acute distress. Eyes: Conjunctivae are normal. PERRL. Head: Atraumatic. Nose: No congestion/rhinnorhea. Mouth/Throat: Mucous membranes are moist. Neck: No stridor Cardiovascular: Grossly normal heart sounds.  Good peripheral circulation. Respiratory: Rales over bilateral lung fields mildly increased respiratory effort.  No retractions.  4 L nasal cannula in place Gastrointestinal: Soft and nontender. No distention. Musculoskeletal: No obvious deformities Neurologic:  Normal speech and language. No gross focal neurologic deficits are appreciated. Skin:  Skin is warm and dry. No rash noted. Psychiatric: Mood and affect are normal. Speech and behavior are normal.  ____________________________________________   LABS (all labs ordered are listed, but only abnormal results are displayed)  Labs Reviewed  BASIC METABOLIC PANEL - Abnormal; Notable for the following components:      Result Value   Glucose, Bld 142 (*)    Calcium 8.3 (*)    All other components within normal limits  BRAIN NATRIURETIC PEPTIDE - Abnormal; Notable for the following components:   B Natriuretic Peptide 869.3 (*)    All other components within normal limits  RESP PANEL BY RT-PCR (FLU A&B, COVID) ARPGX2  CBC  COMPREHENSIVE METABOLIC PANEL  MAGNESIUM  CBC WITH DIFFERENTIAL/PLATELET   ____________________________________________  EKG  ED ECG REPORT I, Naaman Plummer, the attending physician, personally viewed and interpreted  this ECG.  Date: 09/28/2021 EKG Time: 1744 Rate: 86 Rhythm: normal sinus rhythm QRS Axis: normal Intervals: normal ST/T Wave abnormalities: normal Narrative Interpretation: no evidence of acute ischemia.  PVCs  ____________________________________________  RADIOLOGY  ED MD interpretation: 2 view chest x-ray shows cardiomegaly with interstitial edema concerning for congestive heart failure  Official radiology report(s): DG Chest 2 View  Result Date: 09/28/2021 CLINICAL DATA:  Short of breath for the past 3 days. EXAM: CHEST - 2 VIEW COMPARISON:  01/13/2020. FINDINGS: Cardiac silhouette mildly enlarged.  No mediastinal or hilar masses. Bilateral interstitial thickening. Additional opacities noted in the left lung base, likely atelectasis. No pleural effusion.  No pneumothorax. Skeletal structures are demineralized, but grossly intact. IMPRESSION: 1. Cardiomegaly with interstitial thickening. Suspect mild congestive heart failure. Electronically Signed   By: Lajean Manes M.D.   On: 09/28/2021 18:22    ____________________________________________  PROCEDURES  Procedure(s) performed (including Critical Care):  .1-3 Lead EKG Interpretation Performed by: Naaman Plummer, MD Authorized by: Naaman Plummer, MD     Interpretation: normal     ECG rate:  81   ECG rate assessment: normal     Rhythm: sinus rhythm     Ectopy: none     Conduction: normal    CRITICAL CARE Performed by: Naaman Plummer  Total critical care time: 33 minutes  Critical care time was exclusive of separately billable procedures and treating other patients.  Critical care was necessary to treat or prevent imminent or life-threatening deterioration.  Critical care was time spent personally by me on the following activities: development of treatment plan with patient and/or surrogate as well as nursing, discussions with consultants, evaluation of patient's response to treatment, examination of patient,  obtaining history from patient or surrogate, ordering and performing treatments and interventions, ordering and review of laboratory studies, ordering and review of radiographic studies, pulse oximetry and re-evaluation of patient's condition. ____________________________________________   INITIAL IMPRESSION / ASSESSMENT AND PLAN / ED COURSE  As part of my medical decision making, I reviewed the following data within the electronic medical record, if available:  Nursing notes reviewed and incorporated, Labs reviewed, EKG interpreted, Old chart reviewed, Radiograph reviewed and Notes from prior ED visits reviewed and incorporated      Endorses dyspnea, endorses LE edema Possible Non adherence to medication regimen  Workup: ECG, CBC, BMP, Troponin, BNP, CXR Findings: EKG: No STEMI and no evidence of Brugadas sign, delta wave, epsilon wave, significantly prolonged QTc, or malignant arrhythmia. BNP: 869 CXR: Cardiomegaly and interstitial edema Based on history, exam and findings, presentation most consistent with acute on chronic heart failure. Low suspicion for PNA, ACS, tamponade, aortic dissection. Interventions: Oxygen, Diuresis  Reassessment: Symptoms improved in ED with oxygen and diuresis  Disposition (Stable but not significantly improved): Admit to medicine for further monitoring and for improvement of medication regimen to control symptoms.      ____________________________________________   FINAL CLINICAL IMPRESSION(S) / ED DIAGNOSES  Final diagnoses:  Acute on chronic congestive heart failure, unspecified heart failure type ([place])  Acute respiratory failure with hypoxia (HCC)  Acute pulmonary edema (HCC)  Shortness of breath  Cardiomegaly     ED Discharge Orders     None        Note:  This document was prepared using Dragon voice recognition software and may include unintentional dictation errors.    Naaman Plummer, MD 09/28/21 2230

## 2021-09-28 NOTE — Assessment & Plan Note (Signed)
•   Seemingly normotensive without any regular antihypertensive therapy on home medication list  PRN intravenous antihypertensives for excessively elevated blood pressure

## 2021-09-28 NOTE — ED Triage Notes (Signed)
Pt via EMS from home. Pt c/o SOB for the past 3 days. Denies pain. Denies fever. Denies hx of CHF/COPD. Pt on 4L [place] that is not normal for him. Pt is A&Ox4 and NAD.

## 2021-09-28 NOTE — Assessment & Plan Note (Signed)
·   Continue home regimen of Proscar and Flomax

## 2021-09-28 NOTE — ED Notes (Signed)
This RN got pt up to ambulate to the bathroom. Pt was unsteady on feet, respirations increased, and breathing became more labored while ambulating.

## 2021-09-28 NOTE — ED Notes (Signed)
Cheri Fowler, MD at bedside.

## 2021-09-28 NOTE — H&P (Addendum)
History and Physical    [name] WJX:[id number] DOB: [birth year] DOA: 09/28/2021  PCP: [name], MD  Patient coming from: Home via EMS   Chief Complaint:  Chief Complaint  Patient presents with   Shortness of Breath     HPI:    87 year old male with past medical history of systolic and diastolic congestive heart failure (Echo 12/2019 EF 30-35% wtih G3DD), myasthenia gravis, benign prostatic hyperplasia, hypertension, coronary artery disease, lymphedema, hyperlipidemia who presents to [facility] emergency department with complaints of shortness of breath.  Patient explains that approximately 3 days ago he began to develop shortness of breath.  Initially shortness of breath was mild in intensity but progressively became more more severe.  Shortness of breath is worse with exertion and improved with rest.  Patient explains that over the same period of time he has been experiencing progressively worsening bilateral lower extremity swelling.  Patient denies any associated chest pain, cough, fever, sick contacts, recent travel or contact with confirmed COVID-19 infection.  Patient symptoms continued to worsen until eventually EMS was contacted.  EMS promptly came to evaluate the patient and patient was placed on 4 L of supplemental oxygen and brought into [facility] emergency department for evaluation.  Upon evaluation in the emergency department chest x-ray was performed revealing mild pulmonary edema.  BNP was performed and was found to be elevated at 869.  Patient was felt to be suffering from acute congestive heart failure and 40 mg of intravenous Lasix was administered.  The hospitalist group was then called to assess the patient for admission to the hospital.  Review of Systems:   Review of Systems  Respiratory:  Positive for cough and shortness of breath.   Cardiovascular:  Positive for orthopnea and leg swelling.  All other systems reviewed and are negative.  Past Medical History:   Diagnosis Date   Abnormal chest CT 03/28/2017   Allergy    Anemia    Angina pectoris ([place]) 04/02/2011   Aortic aneurysm ([place]) 03/28/2017   Saw Dr Genevive Bi 04/16/17 - recommended f/u CT in 3 months.     Atherosclerosis of both carotid arteries 07/17/2014   Benign lipomatous neoplasm of skin and subcutaneous tissue of left arm 10/04/2013   Bilateral carotid artery stenosis 08/30/2014   Overview:  Less than 50% 2015   Bladder cancer ([place]) 03/05/2013   CAD (coronary artery disease) 06/18/2014   Cancer ([place]) 11-24-12   bladder. BCG treatments by Dr Jacqlyn Larsen.   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 WD SCC. Re-shaved 01/14/2016. Excised 02/05/2016, margins free.   Squamous cell carcinoma of skin 11/03/2018   Right cheek ant. to sideburn. Poorly differentiated.   Squamous cell carcinoma of skin 02/02/2019   Right mid  dorsum forearm. WD SCC. EDC.    Past Surgical History:  Procedure Laterality Date   [place]     reports that he quit smoking about 63 years ago. His smoking use included cigarettes. He has never used smokeless tobacco. He reports current alcohol use. He reports that he does not  use drugs.  Allergies  Allergen Reactions   Beta Adrenergic Blockers Other (See Comments)    Beta Blocker will precipitate acute weakness in Lambert-Eaton Syndrome or Myasthenia Gravis   Calcium Channel Blockers Other (See Comments)    Calcium Channel Blocker- will precipitate acute weakness in Lambert-Eaton Syndrome.   Ciprofloxacin Other (See Comments)    Last resort due to Myasthia Gravis   Sulfa Antibiotics Rash    Family History  Problem Relation Age of Onset   Lung cancer Sister    Prostate cancer Brother    Lung cancer Brother    Arthritis Other        parent   Colon cancer Neg Hx      Prior to Admission medications   Medication Sig Start Date End Date Taking? Authorizing Provider  aspirin 81 MG tablet Take 81 mg by mouth daily.    [provider]  finasteride (PROSCAR) 5 MG tablet Take 5 mg by mouth daily.    [provider]  furosemide (LASIX) 40 MG tablet Take 1 tablet (40 mg total) by mouth daily. 04/03/20 06/04/21  Alisa Graff, FNP  hydroxypropyl methylcellulose / hypromellose (ISOPTO TEARS / GONIOVISC) 2.5 % ophthalmic solution Place 1 drop into both eyes daily.    [provider]  mycophenolate (CELLCEPT) 250 MG capsule Take 1,000 mg by mouth 2 (two) times daily.     [provider]  pravastatin (PRAVACHOL) 20 MG tablet Take 1 tablet (20 mg total) by mouth daily. 07/27/14   [name], MD  prednisoLONE acetate (PRED FORTE) 1 % ophthalmic suspension Place 1 drop into the right eye daily. Per patient. 01/15/20   Enzo Bi, MD  pyridostigmine (MESTINON) 60 MG tablet Take 30 mg by mouth 6 (six) times daily.     [provider]  tacrolimus (PROTOPIC) 0.1 % ointment Apply topically 2 (two) times daily. 07/29/21   Ralene Bathe, MD  tamsulosin (FLOMAX) 0.4 MG CAPS Take 0.4 mg by mouth daily.    [provider]    Physical Exam: Vitals:   09/29/21 0500 09/29/21 0530 09/29/21 0600 09/29/21 0630  BP: (!) 123/95  (!) 115/58 130/65 (!) 112/56  Pulse:  66 69 (!) 59  Resp:  19 17 19   Temp:      TempSrc:      SpO2: 96% 96% 100% 99%  Weight:      Height:        Constitutional: Awake alert and oriented x3, no associated distress.   Skin: no rashes, no lesions, good skin turgor noted. Eyes: Pupils are equally reactive to light.  No evidence of scleral icterus or conjunctival pallor.  ENMT: Moist mucous membranes noted.  Posterior pharynx clear of any exudate or lesions.   Neck: normal, supple, no masses, no thyromegaly.  Slightly elevated jugular venous pulse at 45 degrees.   Respiratory: Bibasilar and mid field rales with scattered rhonchi mainly throughout the left lung fields.  Normal respiratory effort. No accessory muscle use.  Cardiovascular: Regular rate and rhythm, no murmurs / rubs / gallops.  Bilateral  lower extremity pitting edema that tracks in the feet all the way through the thighs.  2+ pedal pulses. No carotid bruits.  Chest:   Nontender without crepitus or deformity.   Back:   Nontender without crepitus or deformity. Abdomen: Abdomen is protuberant but soft and nontender.  No evidence of intra-abdominal masses.  Positive bowel sounds noted in all quadrants.   Musculoskeletal: No joint deformity upper and lower extremities. Good ROM, no contractures. Normal muscle tone.  Neurologic: CN 2-12 grossly intact. Sensation intact.  Patient moving all 4 extremities spontaneously.  Patient is following all commands.  Patient is responsive to verbal stimuli.   Psychiatric: Patient exhibits normal mood with appropriate affect.  Patient seems to possess insight as to their current situation.     Labs on Admission: I have personally reviewed following labs and imaging studies -   CBC: Recent Labs  Lab 09/28/21 1751 09/29/21 0500  WBC 6.7 6.8  NEUTROABS  --  5.3  HGB 13.4 13.5  HCT 43.8 44.6  MCV 96.1 97.4  PLT 200 308   Basic Metabolic Panel: Recent Labs  Lab 09/28/21 1751 09/29/21 0500   NA 139 137  K 4.4 4.5  CL 103 99  CO2 29 29  GLUCOSE 142* 114*  BUN 22 21  CREATININE 0.95 0.99  CALCIUM 8.3* 8.8*  MG  --  2.1   GFR: Estimated Creatinine Clearance: 53.5 mL/min (by C-G formula based on SCr of 0.99 mg/dL). Liver Function Tests: Recent Labs  Lab 09/29/21 0500  AST 23  ALT 21  ALKPHOS 73  BILITOT 1.1  PROT 6.8  ALBUMIN 3.9   No results for input(s): LIPASE, AMYLASE in the last 168 hours. No results for input(s): AMMONIA in the last 168 hours. Coagulation Profile: No results for input(s): INR, PROTIME in the last 168 hours. Cardiac Enzymes: No results for input(s): CKTOTAL, CKMB, CKMBINDEX, TROPONINI in the last 168 hours. BNP (last 3 results) No results for input(s): PROBNP in the last 8760 hours. HbA1C: No results for input(s): HGBA1C in the last 72 hours. CBG: No results for input(s): GLUCAP in the last 168 hours. Lipid Profile: No results for input(s): CHOL, HDL, LDLCALC, TRIG, CHOLHDL, LDLDIRECT in the last 72 hours. Thyroid Function Tests: No results for input(s): TSH, T4TOTAL, FREET4, T3FREE, THYROIDAB in the last 72 hours. Anemia Panel: No results for input(s): VITAMINB12, FOLATE, FERRITIN, TIBC, IRON, RETICCTPCT in the last 72 hours. Urine analysis:    Component Value Date/Time   COLORURINE YELLOW 06/30/2019 1022   APPEARANCEUR [place] (A) 06/30/2019 1022   LABSPEC 1.020 06/30/2019 1022   PHURINE 5.5 06/30/2019 1022   GLUCOSEU NEGATIVE 06/30/2019 1022   HGBUR TRACE-LYSED (A) 06/30/2019 1022   BILIRUBINUR NEGATIVE 06/30/2019 1022   BILIRUBINUR n 08/30/2012 1006   KETONESUR NEGATIVE 06/30/2019 1022   PROTEINUR 30 (A) 08/10/2017 1439   UROBILINOGEN 0.2 06/30/2019 1022   NITRITE NEGATIVE 06/30/2019 1022   LEUKOCYTESUR SMALL (A) 06/30/2019 1022    Radiological Exams on Admission - Personally Reviewed: DG Chest 2 View  Result Date: 09/28/2021 CLINICAL DATA:  Short of breath for the past 3 days. EXAM: CHEST - 2 VIEW COMPARISON:   01/13/2020. FINDINGS: Cardiac silhouette mildly enlarged.  No mediastinal or hilar masses. Bilateral interstitial thickening. Additional opacities noted in the left lung base, likely atelectasis. No pleural effusion.  No pneumothorax. Skeletal structures are demineralized, but grossly intact. IMPRESSION: 1. Cardiomegaly with interstitial thickening. Suspect mild congestive heart failure. Electronically Signed   By: Shanon Brow  Ormond M.D.   On: 09/28/2021 18:22    EKG: Personally reviewed.  Rhythm is Normal sinus rhythm with heart rate of 86 bpm.  Evidence of PVCs.  No dynamic ST segment changes appreciated.  Assessment/Plan  * Acute on chronic combined systolic and diastolic CHF (congestive heart failure) ([place])- (present on admission) Patient presenting with at least a several day history of increasing peripheral edema, dyspnea on exertion, cough and weight gain Patient reports at least intermittent noncompliance with his Lasix and partaking in multiple high salt holiday meals Exam reveals bibasilar and mid field rales with scattered rhonchi throughout the left lung fields as well as significant bilateral lower extremity pitting edema through the thighs that seems to be much worse than his usual baseline lymphedema Initiating Lasix 40 mg IV twice daily which will be adjusted based on clinical response Strict input and output monitoring Supplemental oxygen for associated hypoxia Monitoring renal function and electrolytes with serial chemistires Daily weights  Acute respiratory failure with hypoxia ([place])- (present on admission) Patient found to be hypoxic by EMS, currently on 4 L of oxygen via nasal cannula Thought to be secondary to acute cardiogenic pulmonary edema as evidenced by chest x-ray Will continue to provide supplemental oxygen to maintain oxygen saturations of 92 to 96% Will wean off supplemental oxygen as able prior to discharge  Coronary artery disease involving native coronary artery  of native heart without angina pectoris- (present on admission) Patient is currently chest pain free Monitoring patient on telemetry Continue home regimen of antiplatelet therapy, lipid lowering therapy   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  I informed her that we will make every measure that we can to ensure all hygienic precautions are taken as appropriate  Mixed hyperlipidemia- (present on admission) Continuing home regimen of lipid lowering therapy.   Essential hypertension- (present on admission) Seemingly normotensive without any regular antihypertensive therapy on home medication list PRN intravenous antihypertensives for excessively elevated blood pressure    Benign prostatic hyperplasia- (present on admission) Continue home regimen of Proscar and Flomax      Code Status:  Full code  code status decision has been confirmed with: Patient, daughter Family Communication: Son and daughter are at the bedside and have been updated on plan of care.  Status is: Inpatient  Remains inpatient appropriate because: Acute congestive heart failure requiring aggressive intravenous diuretic therapy, close clinical monitoring on telemetry, serial clinical assessments and serial blood testing.        Vernelle Emerald MD [facility] Pager [phone]  If 7PM-7AM, please contact night-coverage [email or web address] Use universal [place] password for that web site. If you do not have the password, please call the hospital operator.  09/29/2021, 8:12 AM

## 2021-09-28 NOTE — Assessment & Plan Note (Signed)
·   Patient found to be hypoxic by EMS, currently on 4 L of oxygen via nasal cannula  Thought to be secondary to acute cardiogenic pulmonary edema as evidenced by chest x-ray  Will continue to provide supplemental oxygen to maintain oxygen saturations of 92 to 96%  Will wean off supplemental oxygen as able prior to discharge

## 2021-09-28 NOTE — Assessment & Plan Note (Signed)
·   Patient presenting with at least a several day history of increasing peripheral edema, dyspnea on exertion, cough and weight gain  Patient reports at least intermittent noncompliance with his Lasix and partaking in multiple high salt holiday meals  Exam reveals bibasilar and mid field rales with scattered rhonchi throughout the left lung fields as well as significant bilateral lower extremity pitting edema through the thighs that seems to be much worse than his usual baseline lymphedema  Initiating Lasix 40 mg IV twice daily which will be adjusted based on clinical response Strict input and output monitoring Supplemental oxygen for associated hypoxia Monitoring renal function and electrolytes with serial chemistires Daily weights

## 2021-09-28 NOTE — Assessment & Plan Note (Signed)
.   Continuing home regimen of lipid lowering therapy.  

## 2021-09-28 NOTE — Assessment & Plan Note (Signed)
?   Patient is currently chest pain free ?? Monitoring patient on telemetry ?? Continue home regimen of antiplatelet therapy, lipid lowering therapy  ? ?

## 2021-09-29 ENCOUNTER — Inpatient Hospital Stay: Payer: Medicare Other

## 2021-09-29 DIAGNOSIS — G928 Other toxic encephalopathy: Secondary | ICD-10-CM

## 2021-09-29 DIAGNOSIS — I5043 Acute on chronic combined systolic (congestive) and diastolic (congestive) heart failure: Secondary | ICD-10-CM

## 2021-09-29 DIAGNOSIS — J9602 Acute respiratory failure with hypercapnia: Secondary | ICD-10-CM

## 2021-09-29 DIAGNOSIS — G7000 Myasthenia gravis without (acute) exacerbation: Secondary | ICD-10-CM

## 2021-09-29 LAB — BLOOD GAS, ARTERIAL
Acid-Base Excess: 4.6 mmol/L — ABNORMAL HIGH (ref 0.0–2.0)
Acid-Base Excess: 6.9 mmol/L — ABNORMAL HIGH (ref 0.0–2.0)
Bicarbonate: 32.5 mmol/L — ABNORMAL HIGH (ref 20.0–28.0)
Bicarbonate: 34.1 mmol/L — ABNORMAL HIGH (ref 20.0–28.0)
Delivery systems: POSITIVE
Expiratory PAP: 6
FIO2: 0.44
FIO2: 0.4
Inspiratory PAP: 16
O2 Saturation: 94.9 %
O2 Saturation: 98.9 %
Patient temperature: 37
Patient temperature: 37
pCO2 arterial: 63 mmHg — ABNORMAL HIGH (ref 32.0–48.0)
pCO2 arterial: 59 mmHg — ABNORMAL HIGH (ref 32.0–48.0)
pH, Arterial: 7.32 — ABNORMAL LOW (ref 7.350–7.450)
pH, Arterial: 7.37 (ref 7.350–7.450)
pO2, Arterial: 81 mmHg — ABNORMAL LOW (ref 83.0–108.0)
pO2, Arterial: 133 mmHg — ABNORMAL HIGH (ref 83.0–108.0)

## 2021-09-29 LAB — CBC WITH DIFFERENTIAL/PLATELET
Abs Immature Granulocytes: 0.03 10*3/uL (ref 0.00–0.07)
Basophils Absolute: 0 10*3/uL (ref 0.0–0.1)
Basophils Relative: 0 %
Eosinophils Absolute: 0 10*3/uL (ref 0.0–0.5)
Eosinophils Relative: 1 %
HCT: 44.6 % (ref 39.0–52.0)
Hemoglobin: 13.5 g/dL (ref 13.0–17.0)
Immature Granulocytes: 0 %
Lymphocytes Relative: 11 %
Lymphs Abs: 0.8 10*3/uL (ref 0.7–4.0)
MCH: 29.5 pg (ref 26.0–34.0)
MCHC: 30.3 g/dL (ref 30.0–36.0)
MCV: 97.4 fL (ref 80.0–100.0)
Monocytes Absolute: 0.6 10*3/uL (ref 0.1–1.0)
Monocytes Relative: 9 %
Neutro Abs: 5.3 10*3/uL (ref 1.7–7.7)
Neutrophils Relative %: 79 %
Platelets: 207 10*3/uL (ref 150–400)
RBC: 4.58 MIL/uL (ref 4.22–5.81)
RDW: 14.3 % (ref 11.5–15.5)
WBC: 6.8 10*3/uL (ref 4.0–10.5)
nRBC: 0 % (ref 0.0–0.2)

## 2021-09-29 LAB — COMPREHENSIVE METABOLIC PANEL
ALT: 21 U/L (ref 0–44)
AST: 23 U/L (ref 15–41)
Albumin: 3.9 g/dL (ref 3.5–5.0)
Alkaline Phosphatase: 73 U/L (ref 38–126)
Anion gap: 9 (ref 5–15)
BUN: 21 mg/dL (ref 8–23)
CO2: 29 mmol/L (ref 22–32)
Calcium: 8.8 mg/dL — ABNORMAL LOW (ref 8.9–10.3)
Chloride: 99 mmol/L (ref 98–111)
Creatinine, Ser: 0.99 mg/dL (ref 0.61–1.24)
GFR, Estimated: >60 mL/min (ref >60)
Glucose, Bld: 114 mg/dL — ABNORMAL HIGH (ref 70–99)
Potassium: 4.5 mmol/L (ref 3.5–5.1)
Sodium: 137 mmol/L (ref 135–145)
Total Bilirubin: 1.1 mg/dL (ref 0.3–1.2)
Total Protein: 6.8 g/dL (ref 6.5–8.1)

## 2021-09-29 LAB — TSH: TSH: 1.543 u[IU]/mL (ref 0.350–4.500)

## 2021-09-29 LAB — URINALYSIS, ROUTINE W REFLEX MICROSCOPIC
Bacteria, UA: NONE SEEN
Bilirubin Urine: NEGATIVE
Glucose, UA: NEGATIVE mg/dL
Ketones, ur: NEGATIVE mg/dL
Leukocytes,Ua: NEGATIVE
Nitrite: NEGATIVE
Protein, ur: NEGATIVE mg/dL
Specific Gravity, Urine: 1.019 (ref 1.005–1.030)
Squamous Epithelial / HPF: NONE SEEN (ref 0–5)
pH: 5 (ref 5.0–8.0)

## 2021-09-29 LAB — T4, FREE: Free T4: 1.12 ng/dL (ref 0.61–1.12)

## 2021-09-29 LAB — MAGNESIUM: Magnesium: 2.1 mg/dL (ref 1.7–2.4)

## 2021-09-29 LAB — PROTIME-INR
INR: 1.1 (ref 0.8–1.2)
Prothrombin Time: 14 seconds (ref 11.4–15.2)

## 2021-09-29 LAB — APTT: aPTT: 34 seconds (ref 24–36)

## 2021-09-29 LAB — CBG MONITORING, ED: Glucose-Capillary: 108 mg/dL — ABNORMAL HIGH (ref 70–99)

## 2021-09-29 LAB — PROCALCITONIN: Procalcitonin: <0.1 ng/mL

## 2021-09-29 MED ORDER — IOHEXOL 350 MG/ML SOLN
100.0000 mL | Freq: Once | INTRAVENOUS | Status: AC | PRN
  Administered 2021-09-29: 100 mL via INTRAVENOUS
  Filled 2021-09-29: qty 100

## 2021-09-29 MED ORDER — SODIUM CHLORIDE 0.9% FLUSH
3.0000 mL | Freq: Once | INTRAVENOUS | Status: AC
  Administered 2021-09-29: 3 mL via INTRAVENOUS

## 2021-09-29 MED ORDER — OXYBUTYNIN CHLORIDE 5 MG PO TABS
5.0000 mg | ORAL_TABLET | Freq: Once | ORAL | Status: AC
  Administered 2021-09-30: 5 mg via ORAL
  Filled 2021-09-29: qty 1

## 2021-09-29 MED ORDER — SODIUM CHLORIDE 0.9 % IV SOLN
500.0000 mg | INTRAVENOUS | Status: DC
  Administered 2021-09-29: 500 mg via INTRAVENOUS
  Filled 2021-09-29: qty 5

## 2021-09-29 MED ORDER — SODIUM CHLORIDE 0.9 % IV SOLN
1.0000 g | INTRAVENOUS | Status: DC
  Administered 2021-09-29: 1 g via INTRAVENOUS
  Filled 2021-09-29: qty 10

## 2021-09-29 MED ORDER — MUSCLE RUB 10-15 % EX CREA
1.0000 "application " | TOPICAL_CREAM | CUTANEOUS | Status: DC | PRN
  Administered 2021-09-30: 1 via TOPICAL
  Filled 2021-09-29: qty 85

## 2021-09-29 NOTE — ED Notes (Signed)
Code Stroke called to care link at [facility]

## 2021-09-29 NOTE — Progress Notes (Signed)
NIF -18 cmH2O VC 1.06 L

## 2021-09-29 NOTE — Progress Notes (Signed)
Received call from Kasa MD, he reviewed most recent ABG and asked that patient be taken off BIPAP and placed on [place]. Order for BIPAP to be made QHS.

## 2021-09-29 NOTE — Progress Notes (Signed)
Responded to code stroke page. Daughter and physician bedside. Provided refreshments for daughter, no additional needs expressed.

## 2021-09-29 NOTE — Progress Notes (Signed)
Received call from pt's RN who states the MD has ordered BIPAP after reviewing ABG results. Patient is currently in ED pod C which has only one oxygen outlet and that is what would be used for the BIPAP. There would be no other outlet available if needed for an emergency situation.  RN stated he would reach out to charge RN for a room back in there ER area.

## 2021-09-29 NOTE — ED Notes (Signed)
Pt noted to be sleeping soundly.  Tolerating bipap well.

## 2021-09-29 NOTE — Consult Note (Signed)
Neurology Consultation  Reason for Consult: code stroke. AMS. Not talking Referring Physician: Dr Ellender Hose  CC: AMS, not talking  History is obtained from:chart, daughter  HPI: [name] is a 88 y.o. male PMH of AAA, CAD, bladder cancer Lambert-Eaton myasthenic syndrome diagnosed in 1978-on Mestinon and CellCept currently, systolic congestive heart failure, herpes zoster ophthalmicus with nearly no vision in the right eye, hypercholesterolemia, hypertension, presenting to the emergency room for multiple days of worsening shortness of breath and was admitted for treatment of acute on chronic combined systolic and diastolic CHF.  The daughter, who is an excellent historian, reports that he has been feeling unwell for the last week or so.  He started noticing increasing in his leg swelling.  Compliance with Lasix is intermittent plus reports partaking in multiple high salt holiday meals.  When the family checked on him every day as they do-he lives independently, they felt that he was getting progressively short of breath.  That is what brought him to the hospital. He was found to be hypoxic by EMS, put on 4 L of oxygen via nasal cannula, overnight his oxygen demand increased to 6 L and his O2 saturations at some point dropped to the high 80s and then returned back to the 90s. His last known well is difficult ascertain because he has been mostly drowsy but he did talk to the daughter sometime around 3:30 AM with somewhat of his normal speech but this morning, on nursing rounds it was noted that he was very less responsive and was not talking. A code stroke was activated for this acute change. The daughter does mention that somewhere around 530 he did ask her what the time was but fell right back asleep and it is hard again as said above to ascertain the correct last known normal.  Also no other focal deficits were noted by the ED or the admitting providers and the documentation. Patient was unable to  provide any history Taken for stat noncontrast head CT, CTA and CT perfusion all of which were unremarkable for acute process.  In the past, he has had CHF exacerbations requiring intubation.  He remains full code. Daughter also reported that he was given beta-blockers which exacerbated his LEMS in the past.  He is compliant with his CellCept.  He is also prescribed Mestinon 30 mg 6 times a day which daughter thinks he is mostly compliant with.   LKW: Unclear- ?3:30 AM tpa given?: no, nonfocal exam, likely not a stroke-toxic metabolic encephalopathy Premorbid modified Rankin scale (mRS): 2-3 -but lives independently, difficulty with gait etc.   ROS: Full ROS was performed and is negative except as noted in the HPI.  Past Medical History:  Diagnosis Date   Abnormal chest CT 03/28/2017   Allergy    Anemia    Angina pectoris ([place]) 04/02/2011   Aortic aneurysm ([place]) 03/28/2017   Saw Dr Genevive Bi 04/16/17 - recommended f/u CT in 3 months.     Atherosclerosis of both carotid arteries 07/17/2014   Benign lipomatous neoplasm of skin and subcutaneous tissue of left arm 10/04/2013   Bilateral carotid artery stenosis 08/30/2014   Overview:  Less than 50% 2015   Bladder cancer ([place]) 03/05/2013   CAD (coronary artery disease) 06/18/2014   Cancer ([place]) 11-24-12   bladder. BCG treatments by Dr Jacqlyn Larsen.   Chicken pox    Chronic prostatitis 09/30/2012   Colon polyp    Congestive heart failure ([place]) 04/20/2014   Overview:  Overview:  With  anterior mi and moderate lv dyfunction ef 35%   Dizziness 12/22/2016   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[place]) 08/30/2012   Eaton-Lambert syndrome (HCC)    Elevated prostate specific antigen (PSA) 09/30/2012   Essential hypertension, benign    Gross hematuria 09/30/2012   Herpes zoster 11/28/2011   Overview:  with ocular involvement OD   Hypercholesterolemia    Hypertension, benign 04/02/2011   Hypotension    Moderate mitral insufficiency 05/18/2015   Moderate tricuspid insufficiency  05/18/2015   Myasthenia gravis ([place])    Myasthenia gravis without exacerbation ([place]) 03/31/2011   Shingles 2013   Squamous cell carcinoma of skin 11/22/2015   Left cheek. WD SCC. Re-shaved 01/14/2016. Excised 02/05/2016, margins free.   Squamous cell carcinoma of skin 11/03/2018   Right cheek ant. to sideburn. Poorly differentiated.   Squamous cell carcinoma of skin 02/02/2019   Right mid dorsum forearm. WD SCC. EDC.   Family History  Problem Relation Age of Onset   Lung cancer Sister    Prostate cancer Brother    Lung cancer Brother    Arthritis Other        parent   Colon cancer Neg Hx     Social History:   reports that he quit smoking about 63 years ago. His smoking use included cigarettes. He has never used smokeless tobacco. He reports current alcohol use. He reports that he does not use drugs. 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  Medications  Current Facility-Administered Medications:    acetaminophen (TYLENOL) tablet 650 mg, 650 mg, Oral, Q6H PRN **OR** acetaminophen (TYLENOL) suppository 650 mg, 650 mg, Rectal, Q6H PRN, Shalhoub, Sherryll Burger, MD   aspirin EC tablet 81 mg, 81 mg, Oral, Daily, Shalhoub, Sherryll Burger, MD   enoxaparin (LOVENOX) injection 40 mg, 40 mg, Subcutaneous, Q24H, Shalhoub, Sherryll Burger, MD   finasteride (PROSCAR) tablet 5 mg, 5 mg, Oral, Daily, Shalhoub, Sherryll Burger, MD   furosemide (LASIX) injection 40 mg, 40 mg, Intravenous, BID, Shalhoub, Sherryll Burger, MD   hydrALAZINE (APRESOLINE) injection 10 mg, 10 mg, Intravenous, Q6H PRN, Shalhoub, Sherryll Burger, MD   mycophenolate (CELLCEPT) capsule 1,000 mg, 1,000 mg, Oral, BID, Shalhoub, Sherryll Burger, MD, 1,000 mg at 09/29/21 0006   ondansetron (ZOFRAN) tablet 4 mg, 4 mg, Oral, Q6H PRN **OR** ondansetron (ZOFRAN) injection 4 mg, 4 mg, Intravenous, Q6H PRN, Shalhoub, Sherryll Burger, MD   polyethylene glycol (MIRALAX / GLYCOLAX) packet 17 g, 17 g, Oral, Daily PRN, Shalhoub, Sherryll Burger, MD   polyvinyl alcohol (LIQUIFILM TEARS) 1.4 %  ophthalmic solution 1 drop, 1 drop, Both Eyes, Daily, Shalhoub, Sherryll Burger, MD   pravastatin (PRAVACHOL) tablet 20 mg, 20 mg, Oral, q1800, Shalhoub, Sherryll Burger, MD   prednisoLONE acetate (PRED FORTE) 1 % ophthalmic suspension 1 drop, 1 drop, Right Eye, Daily, Shalhoub, Sherryll Burger, MD   pyridostigmine (MESTINON) tablet 30 mg, 30 mg, Oral, 6 X Daily, Shalhoub, Sherryll Burger, MD, 30 mg at 09/29/21 0006   sodium chloride flush (NS) 0.9 % injection 3 mL, 3 mL, Intravenous, Once, Jimmye Norman, Jamiese M, MD   tacrolimus (PROTOPIC) 0.1 % ointment, , Topical, BID, Shalhoub, Sherryll Burger, MD   tamsulosin (FLOMAX) capsule 0.4 mg, 0.4 mg, Oral, Daily, Shalhoub, Sherryll Burger, MD  Current Outpatient Medications:    aspirin 81 MG tablet, Take 81 mg by mouth daily., Disp: , Rfl:    finasteride (PROSCAR) 5 MG tablet, Take 5 mg by mouth daily., Disp: , Rfl:    hydroxypropyl methylcellulose / hypromellose (ISOPTO TEARS /  GONIOVISC) 2.5 % ophthalmic solution, Place 1 drop into both eyes daily., Disp: , Rfl:    mycophenolate (CELLCEPT) 250 MG capsule, Take 1,000 mg by mouth 2 (two) times daily. , Disp: , Rfl:    pravastatin (PRAVACHOL) 20 MG tablet, Take 1 tablet (20 mg total) by mouth daily., Disp: 90 tablet, Rfl: 1   prednisoLONE acetate (PRED FORTE) 1 % ophthalmic suspension, Place 1 drop into the right eye daily. Per patient., Disp: 5 mL, Rfl: 0   pyridostigmine (MESTINON) 60 MG tablet, Take 30 mg by mouth 6 (six) times daily. , Disp: , Rfl:    tacrolimus (PROTOPIC) 0.1 % ointment, Apply topically 2 (two) times daily., Disp: 100 g, Rfl: 2   tamsulosin (FLOMAX) 0.4 MG CAPS, Take 0.4 mg by mouth daily., Disp: , Rfl:    furosemide (LASIX) 40 MG tablet, Take 1 tablet (40 mg total) by mouth daily., Disp: 90 tablet, Rfl: 3   Exam: Current vital signs: BP (!) 112/56    Pulse (!) 59    Temp 97.6 F (36.4 C) (Oral)    Resp 19    Ht 5\' 10"  (1.778 m)    Wt 93.4 kg    SpO2 99%    BMI 29.56 kg/m  Vital signs in last 24 hours: Temp:  [97.6  F (36.4 C)] 97.6 F (36.4 C) (12/31 1739) Pulse Rate:  [25-95] 59 (01/01 0630) Resp:  [17-32] 19 (01/01 0630) BP: (99-155)/(56-99) 112/56 (01/01 0630) SpO2:  [94 %-100 %] 99 % (01/01 0630) Weight:  [93.4 kg] 93.4 kg (12/31 1739) General drowsy, awakens to voice. HEENT: Normocephalic/atraumatic Lungs: Diminished breath sounds all over with scattered rales CVs: Regular rate rhythm Extremities with 1-2+ pedal edema Abdomen-protuberant but nontender Neurological exam Drowsy, opens eyes to voice. Tells me his name and a very dysarthric tone Easily falls back off to sleep. Cranial nerves: His pupils are pinpoint bilaterally with a corneal opacity on the right, no extraocular gaze restriction is noted, does not blink to threat consistently from either side, face is symmetric at rest. Motor exam: He is able to raise both his arms against gravity for 10 seconds.  He is unable to raise his legs antigravity for 5 seconds-they both fall to the bed within a second or so of lifting up but the daughter says that is his baseline Sensation intact to touch Coordination difficult to assess given his mentation NIHSS 1a Level of Conscious.: 1 1b LOC Questions: 2 1c LOC Commands: 0 2 Best Gaze: 0 3 Visual: 0 4 Facial Palsy: 0 5a Motor Arm - left: 0 5b Motor Arm - Right: 0 6a Motor Leg - Left: 2 6b Motor Leg - Right: 2 7 Limb Ataxia: 0 8 Sensory: 0 9 Best Language: 0 10 Dysarthria: 1 11 Extinct. and Inatten.:  TOTAL: 8  Labs I have reviewed labs in epic and the results pertinent to this consultation are:  CBC    Component Value Date/Time   WBC 6.8 09/29/2021 0500   RBC 4.58 09/29/2021 0500   HGB 13.5 09/29/2021 0500   HCT 44.6 09/29/2021 0500   PLT 207 09/29/2021 0500   MCV 97.4 09/29/2021 0500   MCH 29.5 09/29/2021 0500   MCHC 30.3 09/29/2021 0500   RDW 14.3 09/29/2021 0500   LYMPHSABS 0.8 09/29/2021 0500   MONOABS 0.6 09/29/2021 0500   EOSABS 0.0 09/29/2021 0500   BASOSABS 0.0  09/29/2021 0500    CMP     Component Value Date/Time   NA 137  09/29/2021 0500   K 4.5 09/29/2021 0500   CL 99 09/29/2021 0500   CO2 29 09/29/2021 0500   GLUCOSE 114 (H) 09/29/2021 0500   BUN 21 09/29/2021 0500   CREATININE 0.99 09/29/2021 0500   CALCIUM 8.8 (L) 09/29/2021 0500   PROT 6.8 09/29/2021 0500   ALBUMIN 3.9 09/29/2021 0500   AST 23 09/29/2021 0500   ALT 21 09/29/2021 0500   ALKPHOS 73 09/29/2021 0500   BILITOT 1.1 09/29/2021 0500   GFRNONAA >60 09/29/2021 0500   GFRAA >60 01/15/2020 0508    Lipid Panel     Component Value Date/Time   CHOL 140 02/22/2021 1234   TRIG 121.0 02/22/2021 1234   HDL 44.70 02/22/2021 1234   CHOLHDL 3 02/22/2021 1234   VLDL 24.2 02/22/2021 1234   LDLCALC 71 02/22/2021 1234   LDLDIRECT 143.5 08/26/2013 0811     Imaging I have reviewed the images obtained:  CT-head-no acute changes CTA head and neck with no emergent LVO.  50% narrowing of bilateral proximal ICA and right vertebral origin.  Probable CHF signs noted on the CTA head and neck as well. CT perfusion study with no perfusion deficit  Assessment: 88 year old man with above past medical history that includes CHF, Lambert-Eaton myasthenic syndrome amongst others, presenting for worsening shortness of breath likely secondary to CHF exacerbation noted to be off of his baseline mentation-difficult to arouse, not talking for which a code stroke was activated. On my examination he is drowsy and encephalopathic but appears nonfocal otherwise. CT head, CT angiography head and neck and CT perfusion studies are unremarkable for acute ischemia or bleed. At this time, I think his presentation is likely secondary to toxic metabolic encephalopathy from hypoxia and hypercarbia with his underlying Lambert-Eaton myasthenic process also likely worsening his ability to support his respirations. On my encounter, he was saturating in the 90s on 6 L oxygen via nasal  cannula.   Impression: Strokelike symptoms-likely toxic metabolic encephalopathy Hypoxia Hypercarbia Hypoxic and hypercarbic respiratory failure CHF exacerbation Lambert-Eaton myasthenic syndrome-likely symptoms exacerbated in the setting of acute illness  Recommendations: -MRI of the brain when he is able to lie flat-at this time it is very difficult to get that on him. -NIF and FVC is every 6 hours.  Consider elective intubation if NIF is less than -20 cmH2O and vital capacity less than 20 cc/kg body weight. -Avoid medications that can exacerbate myasthenic syndromes as documented below -Medical management of CHF exacerbation versus pneumonia per primary team.  I ordered a chest x-ray.  Arterial blood gas has been ordered that shows pH of 7.32, PCO2 of 63, PO2 of 81, bicarb 32.5. -Check urinalysis to r/o UTI contributing to AMS (he is immunesupressed and susceptible to infections) -I suspect that his current presentation is more related to his cardiorespiratory status then primarily due to underlying myasthenic etiology, hence at this point I am not considering plasma exchange.  Continue on CellCept and Mestinon for now.  MEDS TO AVOID MG medication alert.  1. D-penicillamine should never be used in myasthenic patients.  2. The following drugs produce worsening of weakness in most MG patients who receive them. Use with caution and monitor patients for exacerbation of myasthenic symptoms:  Neuromuscular blocking agents including:  Succinylcholine  d-tubocurarine  Vecuronium  Botulinum toxin  Quinine, quinidine and procainamide  Selected antibiotics including:  Aminoglycosides (tobramycin, gentamycin, kanamycin, neomycin, streptomycin)  Macrolides (erythromycin, azithromycin, telithromycin)  Fluoroquinolones (ciprofloxacin, moxifloxacin, norfloxacin, ofloxacin, pefloxacin)  Colistin  Beta-blockers (such as propranolol,  timolol maleate eyedrops)  Calcium channel blockers  Iodinated  contrast agents  Magnesium salts, including:  Milk of magnesia  Antacids containing magnesium hydroxide (Maalox, Mylanta)  Epsom salts   -- Amie Portland, MD Neurologist [facility] Pager: [phone]  CRITICAL CARE ATTESTATION Performed by: Amie Portland, MD Total critical care time: 60 minutes Critical care time was exclusive of separately billable procedures and treating other patients and/or supervising APPs/Residents/Students Critical care was necessary to treat or prevent imminent or life-threatening deterioration due to toxic metabolic encephalopathy, stroke like symptoms  This patient is critically ill and at significant risk for neurological worsening and/or death and care requires constant monitoring. Critical care was time spent personally by me on the following activities: development of treatment plan with patient and/or surrogate as well as nursing, discussions with consultants, evaluation of patient's response to treatment, examination of patient, obtaining history from patient or surrogate, ordering and performing treatments and interventions, ordering and review of laboratory studies, ordering and review of radiographic studies, pulse oximetry, re-evaluation of patient's condition, participation in multidisciplinary rounds and medical decision making of high complexity in the care of this patient.

## 2021-09-29 NOTE — Progress Notes (Signed)
Chart review of [facility] records shows 2014 abnormal AchR abs. Will order repeat MG abs and [facility] abs.  -- Amie Portland, MD Neurologist [facility] Pager: [phone]

## 2021-09-29 NOTE — ED Notes (Signed)
Pt to CT

## 2021-09-29 NOTE — Progress Notes (Signed)
PROGRESS NOTE    JSON Joel  Pace DOB: 1926-07-19 DOA: 09/28/2021 PCP: [name], MD    Assessment & Plan:   Principal Problem:   Acute on chronic combined systolic and diastolic CHF (congestive heart failure) ([place]) Active Problems:   Coronary artery disease involving native coronary artery of native heart without angina pectoris   Acute respiratory failure with hypoxia (HCC)   Essential hypertension   Mixed hyperlipidemia   Lambert-Eaton myasthenic syndrome ([facility]CC)   Benign prostatic hyperplasia  Acute on chronic combined CHF exacerbation: w/ increasing peripheral edema, dyspnea on exertion & weight gain. Dietary indiscretions and intermittent noncompliance w/ lasix. Monitor I/Os and daily weights. Continue on IV lasix. Cardio consulted.   Acute hypoxic & hypercarbic respiratory failure: likely secondary to above. Placed on BiPAP and wean as tolerated   Hx of CAD: denies chest pain. Continue on aspirin, statin   Myasthenia gravis: continue on home dose of cellcept, pyridostigmine  HLD: continue on statin   BPH: continue on home dose of proscar, flomax    DVT prophylaxis: lovenox  Code Status: full  Family Communication: discussed pt's care w/ pt's family at bedside and answered their questions  Disposition Plan: depends on PT/OT recs (not consulted yet)   Level of care: Telemetry Cardiac  Status is: Inpatient  Remains inpatient appropriate because: severity of illness, requiring BiPAP     Consultants:  Neuro Cardio ICU  Procedures:   Antimicrobials:   Subjective: Pt c/o shortness of breath   Objective: Vitals:   09/29/21 0500 09/29/21 0530 09/29/21 0600 09/29/21 0630  BP: (!) 123/95 (!) 115/58 130/65 (!) 112/56  Pulse:  66 69 (!) 59  Resp:  19 17 19   Temp:      TempSrc:      SpO2: 96% 96% 100% 99%  Weight:      Height:        Intake/Output Summary (Last 24 hours) at 09/29/2021 0815 Last data filed at 09/28/2021 2350 Gross  per 24 hour  Intake --  Output 1425 ml  Net -1425 ml   Filed Weights   09/28/21 1739  Weight: 93.4 kg    Examination:  General exam: Appears uncomfortable  Respiratory system: course breath sounds b/l  Cardiovascular system: S1 & S2 +. No rubs, gallops or clicks. B/l LE edema  Gastrointestinal system: Abdomen is nondistended, soft and nontender. Normal bowel sounds heard. Central nervous system: Alert and awake. Moves all extremities  Psychiatry: Judgement and insight appear normal. Flat mood and affect     Data Reviewed: I have personally reviewed following labs and imaging studies  CBC: Recent Labs  Lab 09/28/21 1751 09/29/21 0500  WBC 6.7 6.8  NEUTROABS  --  5.3  HGB 13.4 13.5  HCT 43.8 44.6  MCV 96.1 97.4  PLT 200 811   Basic Metabolic Panel: Recent Labs  Lab 09/28/21 1751 09/29/21 0500  NA 139 137  K 4.4 4.5  CL 103 99  CO2 29 29  GLUCOSE 142* 114*  BUN 22 21  CREATININE 0.95 0.99  CALCIUM 8.3* 8.8*  MG  --  2.1   GFR: Estimated Creatinine Clearance: 53.5 mL/min (by C-G formula based on SCr of 0.99 mg/dL). Liver Function Tests: Recent Labs  Lab 09/29/21 0500  AST 23  ALT 21  ALKPHOS 73  BILITOT 1.1  PROT 6.8  ALBUMIN 3.9   No results for input(s): LIPASE, AMYLASE in the last 168 hours. No results for input(s): AMMONIA in the last 168 hours.  Coagulation Profile: No results for input(s): INR, PROTIME in the last 168 hours. Cardiac Enzymes: No results for input(s): CKTOTAL, CKMB, CKMBINDEX, TROPONINI in the last 168 hours. BNP (last 3 results) No results for input(s): PROBNP in the last 8760 hours. HbA1C: No results for input(s): HGBA1C in the last 72 hours. CBG: No results for input(s): GLUCAP in the last 168 hours. Lipid Profile: No results for input(s): CHOL, HDL, LDLCALC, TRIG, CHOLHDL, LDLDIRECT in the last 72 hours. Thyroid Function Tests: No results for input(s): TSH, T4TOTAL, FREET4, T3FREE, THYROIDAB in the last 72  hours. Anemia Panel: No results for input(s): VITAMINB12, FOLATE, FERRITIN, TIBC, IRON, RETICCTPCT in the last 72 hours. Sepsis Labs: No results for input(s): PROCALCITON, LATICACIDVEN in the last 168 hours.  Recent Results (from the past 240 hour(s))  Resp Panel by RT-PCR (Flu A&B, Covid) Nasopharyngeal Swab     Status: None   Collection Time: 09/28/21  5:51 PM   Specimen: Nasopharyngeal Swab; Nasopharyngeal(NP) swabs in vial transport medium  Result Value Ref Range Status   SARS Coronavirus 2 by RT PCR NEGATIVE NEGATIVE Final    Comment: (NOTE) SARS-CoV-2 target nucleic acids are NOT DETECTED.  The SARS-CoV-2 RNA is generally detectable in upper respiratory specimens during the acute phase of infection. The lowest concentration of SARS-CoV-2 viral copies this assay can detect is 138 copies/mL. A negative result does not preclude SARS-Cov-2 infection and should not be used as the sole basis for treatment or other patient management decisions. A negative result may occur with  improper specimen collection/handling, submission of specimen other than nasopharyngeal swab, presence of viral mutation(s) within the areas targeted by this assay, and inadequate number of viral copies(<138 copies/mL). A negative result must be combined with clinical observations, patient history, and epidemiological information. The expected result is Negative.  Fact Sheet for Patients:  EntrepreneurPulse.com.au  Fact Sheet for Healthcare Providers:  IncredibleEmployment.be  This test is no t yet approved or cleared by the [place] FDA and  has been authorized for detection and/or diagnosis of SARS-CoV-2 by FDA under an Emergency Use Authorization (EUA). This EUA will remain  in effect (meaning this test can be used) for the duration of the COVID-19 declaration under Section 564(b)(1) of the Act, 21 U.S.C.section 360bbb-3(b)(1), unless the authorization is  terminated  or revoked sooner.       Influenza A by PCR NEGATIVE NEGATIVE Final   Influenza B by PCR NEGATIVE NEGATIVE Final    Comment: (NOTE) The Xpert Xpress SARS-CoV-2/FLU/RSV plus assay is intended as an aid in the diagnosis of influenza from Nasopharyngeal swab specimens and should not be used as a sole basis for treatment. Nasal washings and aspirates are unacceptable for Xpert Xpress SARS-CoV-2/FLU/RSV testing.  Fact Sheet for Patients: EntrepreneurPulse.com.au  Fact Sheet for Healthcare Providers: IncredibleEmployment.be  This test is not yet approved or cleared by the [place] FDA and has been authorized for detection and/or diagnosis of SARS-CoV-2 by FDA under an Emergency Use Authorization (EUA). This EUA will remain in effect (meaning this test can be used) for the duration of the COVID-19 declaration under Section 564(b)(1) of the Act, 21 U.S.C. section 360bbb-3(b)(1), unless the authorization is terminated or revoked.  Performed at [facility], [address], [place], [place] [address]          Radiology Studies: DG Chest 2 View  Result Date: 09/28/2021 CLINICAL DATA:  Short of breath for the past 3 days. EXAM: CHEST - 2 VIEW COMPARISON:  01/13/2020. FINDINGS:  Cardiac silhouette mildly enlarged.  No mediastinal or hilar masses. Bilateral interstitial thickening. Additional opacities noted in the left lung base, likely atelectasis. No pleural effusion.  No pneumothorax. Skeletal structures are demineralized, but grossly intact. IMPRESSION: 1. Cardiomegaly with interstitial thickening. Suspect mild congestive heart failure. Electronically Signed   By: Lajean Manes M.D.   On: 09/28/2021 18:22        Scheduled Meds:  aspirin EC  81 mg Oral Daily   enoxaparin (LOVENOX) injection  40 mg Subcutaneous Q24H   finasteride  5 mg Oral Daily   furosemide  40 mg Intravenous BID   mycophenolate  1,000 mg Oral BID    polyvinyl alcohol  1 drop Both Eyes Daily   pravastatin  20 mg Oral q1800   prednisoLONE acetate  1 drop Right Eye Daily   pyridostigmine  30 mg Oral 6 X Daily   sodium chloride flush  3 mL Intravenous Once   tacrolimus   Topical BID   tamsulosin  0.4 mg Oral Daily   Continuous Infusions:   LOS: 1 day    Time spent: 35 mins     Wyvonnia Dusky, MD [facility] Pager [phone]  If 7PM-7AM, please contact night-coverage 09/29/2021, 8:15 AM

## 2021-09-29 NOTE — ED Notes (Signed)
Neuro at bedside.

## 2021-09-29 NOTE — Consult Note (Signed)
NAME:  [name], MRN:  [id number], DOB:  08-Jul-1926, LOS: 1 ADMISSION DATE:  09/28/2021, CONSULTATION DATE:  09/29/2021 REFERRING MD:  Eppie Gibson, MD  CHIEF COMPLAINT:  SOB    HPI  88 year old male with past medical history of systolic and diastolic congestive heart failure (Echo 12/2019 EF 30-35% wtih G3DD), myasthenia gravis(Eaton-Lambert?!)., BPH, HTN, CAD, lymphedema, hyperlipidemia who presents to [facility] ED on 12/31 with complaints of progressive shortness of breath x 3 days. Patient was found to be hypoxic by EMS and placed on 4L via [place] prior to transport to the ED  ED Course: On arrival to the ED, he was afebrile 97.6 F (36.4 C) with blood pressure 155/66 mm Hg and pulse rate 83 beats/min, sats 98% on 4L.  2 view chest x-ray shows cardiomegaly with interstitial edema concerning for congestive heart failure.  Labs revealed unremarkable CBC and CMP. PCT: negative <0.10 COVID PCR: Negative BNP: 869.3 The above findings was felt to be consistent with acute congestive heart failure therefore patient received IV Lasix 40 mg x 1 and was admitted to hospitalist service.  Hospital Course: Overnight patient's oxygen requirement increased to 6L with stats in the low 80s. This morning he was placed on BiPAP due to worsening hypoxia with hypercapnia. He was also noted to be less responsive and nonverbal therefore a code stroke was initiated due to concerns of ischemic event. Initial NIHSS was 8.  A STAT  noncontrast head CT, CTA and CT perfusion all of which were unremarkable for acute process. Patient was evaluated by Neurologist who felt that presentation was  likely secondary to toxic metabolic encephalopathy from hypoxia and hypercarbia with his underlying Lambert-Eaton myasthenic process also likely worsening his ability to support his respirations. Arterial Blood Gas result:  pO2 81; pCO2 63; pH 7.32;  HCO3 32.5, %O2 Sat 94.9. PCCM consulted given high risk for intubation.  Past Medical  History   03/28/2017   Allergy    Anemia    Angina pectoris ([place]) 04/02/2011  Aortic aneurysm ([place]) 03/28/2017  Saw Dr Genevive Bi 04/16/17 - recommended f/u CT in 3 months.    Atherosclerosis of both carotid arteries 07/17/2014  Benign lipomatous neoplasm of skin and subcutaneous tissue of left arm 10/04/2013  Bilateral carotid artery stenosis 08/30/2014  Overview:  Less than 50% 2015  Bladder cancer ([place]) 03/05/2013  CAD (coronary artery disease) 06/18/2014  Cancer ([place]) 11-24-12  bladder. BCG treatments by Dr Jacqlyn Larsen.  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)    Myasthenia gravis without exacerbation ([place]) 03/31/2011  Shingles 2013  Squamous cell carcinoma of skin 11/22/2015  Left cheek. WD SCC. Re-shaved 01/14/2016. Excised 02/05/2016, margins free.  Squamous cell carcinoma of skin 11/03/2018  Right cheek ant. to sideburn. Poorly differentiated.  Squamous cell carcinoma of skin 02/02/2019  Right mid dorsum forearm. WD SCC. EDC.   Significant Hospital Events   09/28/21: Admitted to stepdown unit with acute hypoxic hypercapnic respiratory failure  09/29/21: Code stroke initiated for change in mental status. CTH/CTA negative. Neurology consulted. PCCM consulted due to high risk for intubation.  Consults:  Cardiology Neurology PCCM  Procedures:  None  Significant Diagnostic Tests:  09/29/21: Chest  Xray>Congestive heart failure worsened compared prior exam. 09/29/21: Noncontrast CT head & CTA Neck> no acute intracranial abnormality, no LVO  Micro Data:  12/31: SARS-CoV-2 PCR> negative 12/31: Influenza PCR> negative  Antimicrobials:  None  OBJECTIVE  Blood pressure 101/61, pulse (!) 37, temperature 97.6 F (36.4 C), temperature source Oral, resp. rate 20, height 5\' 10"  (1.778 m), weight 93.4 kg, SpO2 96 %.    FiO2 (%):  [40 %] 40 %   Intake/Output Summary (Last 24 hours) at 09/29/2021 1304 Last data filed at 09/29/2021 1243 Gross per 24 hour  Intake --  Output 2125 ml  Net -2125 ml   Filed Weights   09/28/21 1739  Weight: 93.4 kg   Physical Examination  GENERAL: 88 year-old critically ill patient lying in the bed with no acute distress.  EYES: Pupils equal, round, reactive to light and accommodation on the left. Opacity on the right. No scleral icterus. Extraocular muscles intact.  HEENT: Head atraumatic, normocephalic. Oropharynx and nasopharynx clear.  NECK:  Supple, no jugular venous distention. No thyroid enlargement, no tenderness.  LUNGS: Decreased breath sounds bilaterally, no wheezing, moderate  rales,rhonchi or crepitation. No use of accessory muscles of respiration.  CARDIOVASCULAR: S1, S2 normal. No murmurs, rubs, or gallops.  ABDOMEN: Soft, nontender, distended. Bowel sounds present. No organomegaly or mass.  EXTREMITIES: +pedal edema, cyanosis, or clubbing.  NEUROLOGIC: Cranial nerves II through XII are intact except for moderate dysarthria. Muscle strength in BUE breaks gravity, BLE unable to break gravity. Sensation intact. Gait not checked.  PSYCHIATRIC: The patient is alert but with severe dysarthria and altered mental status SKIN: No obvious rash, lesion, or ulcer.   Labs/imaging that I havepersonally reviewed  (right click and "Reselect all SmartList Selections" daily)     Labs   CBC: Recent Labs  Lab 09/28/21 1751 09/29/21 0500  WBC 6.7 6.8   NEUTROABS  --  5.3  HGB 13.4 13.5  HCT 43.8 44.6  MCV 96.1 97.4  PLT 200 086    Basic Metabolic Panel: Recent Labs  Lab 09/28/21 1751 09/29/21 0500  NA 139 137  K 4.4 4.5  CL 103 99  CO2 29 29  GLUCOSE 142* 114*  BUN 22 21  CREATININE 0.95 0.99  CALCIUM 8.3* 8.8*  MG  --  2.1   GFR: Estimated Creatinine Clearance: 53.5 mL/min (by C-G formula based on SCr of 0.99 mg/dL). Recent Labs  Lab 09/28/21 1751 09/29/21 0500  WBC 6.7 6.8    Liver Function Tests: Recent Labs  Lab 09/29/21 0500  AST 23  ALT 21  ALKPHOS 73  BILITOT 1.1  PROT 6.8  ALBUMIN 3.9   No results for input(s): LIPASE, AMYLASE in the last 168 hours. No results for input(s): AMMONIA in the last 168 hours.  ABG    Component Value Date/Time   PHART 7.32 (L) 09/29/2021 0852   PCO2ART 63 (H) 09/29/2021 0852   PO2ART 81 (L) 09/29/2021 0852   HCO3 32.5 (H) 09/29/2021 0852   O2SAT 94.9 09/29/2021 0852     Coagulation Profile: No results for input(s): INR, PROTIME in the last 168 hours.  Cardiac Enzymes: No results for input(s): CKTOTAL, CKMB, CKMBINDEX, TROPONINI in the last 168 hours.  HbA1C: Hgb A1c MFr Bld  Date/Time Value Ref  Range Status  02/22/2021 12:34 PM 5.9 4.6 - 6.5 % Final    Comment:    Glycemic Control Guidelines for People with Diabetes:Non Diabetic:  <6%Goal of Therapy: <7%Additional Action Suggested:  >8%   11/15/2020 08:50 AM 5.9 4.6 - 6.5 % Final    Comment:    Glycemic Control Guidelines for People with Diabetes:Non Diabetic:  <6%Goal of Therapy: <7%Additional Action Suggested:  >8%     CBG: Recent Labs  Lab 09/29/21 0845  GLUCAP 108*    Review of Systems:   UNABLE TO OBTAIN DUE TO ALTERED MENTAL STATUS  Past Medical History  He,  has a past medical history of Abnormal chest CT (03/28/2017), Allergy, Anemia, Angina pectoris ([place]) (04/02/2011), Aortic aneurysm ([place]) (03/28/2017), Atherosclerosis of both carotid arteries (07/17/2014), Benign lipomatous neoplasm of  skin and subcutaneous tissue of left arm (10/04/2013), Bilateral carotid artery stenosis (08/30/2014), Bladder cancer ([place]) (03/05/2013), CAD (coronary artery disease) (06/18/2014), Cancer ([place]) (11-24-12), Chicken pox, Chronic prostatitis (09/30/2012), Colon polyp, Congestive heart failure ([place]) (04/20/2014), Dizziness (12/22/2016), 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[place]) (08/30/2012), Eaton-Lambert syndrome ([place]), Elevated prostate specific antigen (PSA) (09/30/2012), Essential hypertension, benign, Gross hematuria (09/30/2012), Herpes zoster (11/28/2011), Hypercholesterolemia, Hypertension, benign (04/02/2011), Hypotension, Moderate mitral insufficiency (05/18/2015), Moderate tricuspid insufficiency (05/18/2015), Myasthenia gravis ([place]), Myasthenia gravis without exacerbation ([place]) (03/31/2011), Shingles (2013), Squamous cell carcinoma of skin (11/22/2015), Squamous cell carcinoma of skin (11/03/2018), and Squamous cell carcinoma of skin (02/02/2019).   Surgical History    Past Surgical History:  Procedure Laterality Date   [place]     Social History   reports that he quit smoking about 63 years ago. His smoking use included cigarettes. He has never used smokeless tobacco. He reports current alcohol use. He reports that he does not use drugs.   Family History   His family history includes Arthritis in an other family member; Lung cancer in his brother and sister; Prostate cancer in his brother. There is no history of Colon cancer.   Allergies Allergies  Allergen Reactions   Beta Adrenergic Blockers Other (See Comments)    Beta Blocker will precipitate acute weakness in Lambert-Eaton Syndrome or Myasthenia Gravis   Calcium Channel Blockers Other (See Comments)    Calcium Channel Blocker- will precipitate acute weakness in Lambert-Eaton Syndrome.   Ciprofloxacin Other (See Comments)    Last resort due to Myasthia Gravis   Sulfa  Antibiotics Rash     Home Medications  Prior to Admission medications   Medication Sig Start Date End Date Taking? Authorizing Provider  aspirin 81 MG tablet Take 81 mg by mouth daily.   Yes [provider]  finasteride (PROSCAR) 5 MG tablet Take 5 mg by mouth daily.   Yes [provider]  hydroxypropyl methylcellulose / hypromellose (ISOPTO TEARS / GONIOVISC) 2.5 % ophthalmic solution Place 1 drop into both eyes daily.   Yes [provider]  mycophenolate (CELLCEPT) 250 MG capsule Take 1,000 mg by mouth 2 (two) times daily.    Yes [provider]  pravastatin (PRAVACHOL) 20 MG tablet Take 1 tablet (20 mg total) by mouth daily. 07/27/14  Yes [name], MD  prednisoLONE acetate (PRED FORTE) 1 % ophthalmic suspension Place 1 drop into the right eye daily. Per patient. 01/15/20  Yes Enzo Bi, MD  pyridostigmine (MESTINON) 60 MG tablet Take 30 mg by mouth 6 (six) times daily.    Yes [provider]  tamsulosin (FLOMAX) 0.4 MG CAPS Take 0.4 mg by mouth daily.   Yes [provider]  furosemide (LASIX) 40 MG tablet Take 1 tablet (40 mg total) by mouth daily. 04/03/20 06/04/21  Darylene Price A, FNP  tacrolimus (PROTOPIC) 0.1 % ointment Apply topically 2 (two) times daily. 07/29/21   Ralene Bathe, MD  Scheduled Meds:  aspirin EC  81 mg Oral Daily   enoxaparin (LOVENOX) injection  40 mg Subcutaneous Q24H   finasteride  5 mg Oral Daily   furosemide  40 mg Intravenous BID   mycophenolate  1,000 mg Oral BID   polyvinyl alcohol  1 drop Both Eyes Daily   pravastatin  20 mg Oral q1800   prednisoLONE acetate  1 drop Right Eye Daily   pyridostigmine  30 mg Oral 6 X Daily   tacrolimus   Topical BID   tamsulosin  0.4 mg Oral Daily   Continuous Infusions: PRN Meds:.acetaminophen **OR** acetaminophen, hydrALAZINE, ondansetron **OR** ondansetron (ZOFRAN) IV, polyethylene glycol  Assessment & Plan:  Acute Hypoxic Hypercapnic Respiratory Failure  secondary to Acute Decompensated HFpEF  -Supplemental O2 as needed to maintain O2 saturations 88 to 92% -BiPAP, wean as tolerated -High risk for intubation -Follow intermittent ABG and chest x-ray as needed -Repeat CXR today with increased pulmonary interstitium bilaterally worsened compared prior exam -As needed bronchodilators   Acute on chronic HFpEF (last known EF 30%) -Hypertension Hx: CAD s/p heart catheterization in 2015 which showed a CTO of his mid LAD, HLD  -Continuous cardiac monitoring -Maintain MAP greater than 65 -IV Lasix as blood pressure permits; currently on Lasix 40 mg IV BID Goal IV diureses >1L negative per day until approach euvolemia / worsening renal function. -Hold BP meds in the setting of hypotension -Continue aspirin 81 mg indefinitely -Continue pravastatin 20 mg daily -No beta-blockers during decompensation -Strict I&Os -Low salt diet  -Cardiology following, appreciate input -Repeat 2D Echocardiogram   Acute Metabolic Encephalopathy due to Hypercapnia Initial concerns for ischemic event -CT head/CTA neck negative for acute intracranial abnormality or LVO -Provide supportive care -BiPAP to treat Hypercapnia   Lambert-Eaton myasthenic syndrome-likely symptoms exacerbated in the setting of acute illness -MRI brain when feasible -NIF and FVC q6 hrs. Will consider elective intubation if NIF is less than -20 cmH2O and vital capacity less than 20 cc/kg body weight per Neurology recs. -Follow cultures, check TSH, T4, AchR abs. -Avoid meds that exacerbates myasthenia's -Continue home meds once able to tolerate. -Neurology input appreciated   Best practice:  Diet:  NPO Pain/Anxiety/Delirium protocol (if indicated): No VAP protocol (if indicated): Not indicated DVT prophylaxis: LMWH GI prophylaxis: PPI Glucose control:  SSI No Central venous access:  N/A Arterial line:  N/A Foley:  Yes, and it is still needed Mobility:  bed rest  PT consulted:  N/A Last date of multidisciplinary goals of care discussion [09/29/21] Code Status:  full code Disposition: progressive care unit   = Goals of Care = Code Status Order: FULL  Primary Emergency Contact: Becraft,Ricky, Home Phone: [phone] Wishes to pursue full aggressive treatment and intervention options, including CPR and intubation, but goals of care will be addressed on going with family if that should become necessary.  Critical care time: 45 minutes     Rufina Falco, DNP, CCRN, FNP-C, AGACNP-BC Acute Care Nurse Practitioner  [facility] Pager: [phone] [place] at [facility]  .

## 2021-09-29 NOTE — Consult Note (Signed)
[facility] Cardiology  CARDIOLOGY CONSULT NOTE  Patient ID: AASIM RESTIVO MRN: [id number] DOB/AGE: 1925-11-28 88 y.o.  Admit date: 09/28/2021 Referring Physician Eppie Gibson Primary Physician [name], MD Primary Cardiologist Serafina Royals  Reason for Consultation AoCHF  HPI:  Christhoper Busbee is a 88 year old male with a history of HFrEF (EF 30%), mild aortic stenosis, CAD (CTO LAD, moderate to severe disease in OM), moderate pulmonary hypertension, hypertension, myasthenia gravis who was admitted to the hospital with shortness of breath.  Cardiology is consulted for management of acute on chronic congestive heart failure.  The patient was in his usual state of health until approximately 4 days ago when he developed worsening shortness of breath.  During the same period of time he also had worsening lower extremity edema, but denies any chest pain.  It sounds that he has not been taking his Lasix recently because of family gatherings and not wanting to have to use the restroom during this time.  Additionally, he has had significant dietary indiscretions over the holidays.  He eventually called EMS for his worsening symptoms and required 4 L of supplemental oxygen to maintain an appropriate oxygen saturation.  On arrival to the [facility] emergency department his heart rate was in the 90s, and his blood pressure was 155/66. Labs were notable for a normal creatinine, and a BNP that was elevated to 869.  Chest x-ray showed mild interstitial thickening consistent with congestive heart failure.  He was given IV Lasix.  He needed BiPAP in order to maintain oxygen saturations and a normal work of breathing.  He had an episode of confusion on the morning of January 1 which prompted a stroke code.  Head imaging did not show any acute intracranial abnormalities.  He has had some issues with CO2 retention and remains on BiPAP.  At the time of my interview he is sleeping and does not respond to questioning.  His  son is at the bedside who provides history.  Review of systems complete and found to be negative unless listed above     Past Medical History:  Diagnosis Date   Abnormal chest CT 03/28/2017   Allergy    Anemia    Angina pectoris ([place]) 04/02/2011   Aortic aneurysm ([place]) 03/28/2017   Saw Dr Genevive Bi 04/16/17 - recommended f/u CT in 3 months.     Atherosclerosis of both carotid arteries 07/17/2014   Benign lipomatous neoplasm of skin and subcutaneous tissue of left arm 10/04/2013   Bilateral carotid artery stenosis 08/30/2014   Overview:  Less than 50% 2015   Bladder cancer ([place]) 03/05/2013   CAD (coronary artery disease) 06/18/2014   Cancer ([place]) 11-24-12   bladder. BCG treatments by Dr Jacqlyn Larsen.   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 WD SCC. Re-shaved 01/14/2016. Excised 02/05/2016, margins free.   Squamous cell carcinoma of skin 11/03/2018   Right cheek ant. to sideburn. Poorly differentiated.   Squamous cell carcinoma of skin 02/02/2019   Right mid dorsum forearm. WD SCC. EDC.    Past Surgical History:  Procedure Laterality Date   [place]    (Not in a hospital  admission)  Social History   Socioeconomic History   Marital status: Married    Spouse name: Not on file   Number of children: Not on file   Years of education: Not on file   Highest education level: Not on file  Occupational History   Not on file  Tobacco Use   Smoking status: Former    Types: Cigarettes    Quit date: 09/29/1958    Years since quitting: 63.0   Smokeless tobacco: Never  Vaping Use   Vaping Use: Never used  Substance and Sexual Activity   Alcohol use: Yes    Alcohol/week: 0.0 standard drinks    Comment: occasionally   Drug use: No   Sexual activity: Never  Other Topics Concern   Not on file  Social History Narrative   Pt is married with 2 children - 1 son, 1 daughter. Previously self-employed in [place] Strain: Low Risk    Difficulty of Paying Living Expenses: Not hard at all  Food Insecurity: No Food Insecurity   Worried About Charity fundraiser in the Last Year: Never true   Arboriculturist in the Last Year: Never true  Transportation Needs: No Transportation Needs   Lack of Transportation (Medical): No   Lack of Transportation (Non-Medical): No  Physical Activity: Not on file  Stress: No Stress Concern Present   Feeling of Stress : Not at all  Social Connections: Unknown   Frequency of Communication with Friends and Family: More than three times a week   Frequency of Social Gatherings with Friends and Family: More than three times a week   Attends Religious Services: Not on file   Active Member of Clubs or Organizations: Not on file   Attends Archivist Meetings: Not on file   Marital Status: Widowed  Human resources officer Violence: Not At Risk   Fear of Current or Ex-Partner: No   Emotionally Abused: No   Physically Abused: No   Sexually Abused: No    Family History  Problem Relation Age of Onset   Lung cancer Sister    Prostate cancer Brother    Lung cancer Brother     Arthritis Other        parent   Colon cancer Neg Hx       Review of systems complete and found to be negative unless listed above      PHYSICAL EXAM  General: On BiPAP. Sleeping HEENT:  Normocephalic and atramatic Neck:  JVD elevated at 30 degrees Lungs: Course breath sounds throughout. On BiPAP. Heart: HRRR, frequent extra beats. Difficult to hear m/r/g over breath sounds. Abdomen: Bowel sounds are positive, abdomen soft and non-tender  Msk:  Back normal, normal gait. Normal strength and tone for age. Extremities: Compression stockings in place. 2+ LE edema   Neuro: Sleeping at time of interview Psych:  Sleeping at time of interview  Labs:   Lab Results  Component Value Date   WBC 6.8 09/29/2021   HGB 13.5 09/29/2021   HCT 44.6 09/29/2021   MCV 97.4 09/29/2021   PLT 207 09/29/2021    Recent Labs  Lab 09/29/21 0500  NA 137  K 4.5  CL 99  CO2 29  BUN 21  CREATININE 0.99  CALCIUM 8.8*  PROT 6.8  BILITOT 1.1  ALKPHOS 73  ALT 21  AST 23  GLUCOSE 114*   Lab Results  Component Value Date   TROPONINI 0.43 (HH) 12/29/2016    Lab Results  Component Value Date   CHOL 140 02/22/2021   CHOL 159 11/15/2020   CHOL 155 07/16/2020   Lab Results  Component Value Date   HDL 44.70 02/22/2021   HDL 57.20 11/15/2020   HDL 53.80 07/16/2020   Lab Results  Component Value Date   LDLCALC 71 02/22/2021   LDLCALC 87 11/15/2020   LDLCALC 86 07/16/2020   Lab Results  Component Value Date   TRIG 121.0 02/22/2021   TRIG 74.0 11/15/2020   TRIG 77.0 07/16/2020   Lab Results  Component Value Date   CHOLHDL 3 02/22/2021   CHOLHDL 3 11/15/2020   CHOLHDL 3 07/16/2020   Lab Results  Component Value Date   LDLDIRECT 143.5 08/26/2013      Radiology: DG Chest 1 View  Result Date: 09/29/2021 CLINICAL DATA:  Shortness of breath. Patient has a history of congestive heart failure. EXAM: CHEST  1 VIEW COMPARISON:  September 28, 2021 5:44 p.m. FINDINGS: The heart size is  enlarged. Mediastinal contour is stable. There is increased pulmonary interstitium bilaterally worsened compared prior exam. Probable small left pleural effusion is identified. Osseous structures are stable. IMPRESSION: Congestive heart failure worsened compared prior exam. Electronically Signed   By: Abelardo Diesel M.D.   On: 09/29/2021 09:27   DG Chest 2 View  Result Date: 09/28/2021 CLINICAL DATA:  Short of breath for the past 3 days. EXAM: CHEST - 2 VIEW COMPARISON:  01/13/2020. FINDINGS: Cardiac silhouette mildly enlarged.  No mediastinal or hilar masses. Bilateral interstitial thickening. Additional opacities noted in the left lung base, likely atelectasis. No pleural effusion.  No pneumothorax. Skeletal structures are demineralized, but grossly intact. IMPRESSION: 1. Cardiomegaly with interstitial thickening. Suspect mild congestive heart failure. Electronically Signed   By: Lajean Manes M.D.   On: 09/28/2021 18:22   CT HEAD CODE STROKE WO CONTRAST  Result Date: 09/29/2021 CLINICAL DATA:  Code stroke. EXAM: CT HEAD WITHOUT CONTRAST TECHNIQUE: Contiguous axial images were obtained from the base of the skull through the vertex without intravenous contrast. COMPARISON:  None. FINDINGS: Brain: No evidence of acute infarction, hemorrhage, hydrocephalus, extra-axial collection or mass lesion/mass effect. Vascular: No hyperdense vessel or unexpected calcification. Skull: Normal. Negative for fracture or focal lesion. Sinuses/Orbits: No acute finding Other: These results were communicated to Dr. Rory Percy at 8:30 am on 09/29/2021 by text page via the [facility] messaging system. ASPECTS [facility] Stroke Program Early CT Score) Not scored without localizing symptom IMPRESSION: Motion degraded head CT without acute finding. Electronically Signed   By: Jorje Guild M.D.   On: 09/29/2021 08:31   CT ANGIO HEAD NECK W WO CM W PERF (CODE STROKE)  Result Date: 09/29/2021 CLINICAL DATA:  Acute stroke suspected EXAM: CT  ANGIOGRAPHY HEAD AND NECK TECHNIQUE: Multidetector CT imaging of the head and neck was performed using the standard protocol during bolus administration of intravenous contrast. Multiplanar CT image reconstructions and MIPs were obtained to evaluate the vascular anatomy.  Carotid stenosis measurements (when applicable) are obtained utilizing NASCET criteria, using the distal internal carotid diameter as the denominator. CONTRAST:  Dose is not known on this in progress study COMPARISON:  Noncontrast head CT earlier the same day FINDINGS: CTA NECK FINDINGS Aortic arch: Elongated and atheromatous aorta. Right carotid system: Diffuse atheromatous plaque. Mixed density, mainly calcified plaque at the bifurcation and bulb with proximal ICA stenosis measuring up to 50%. Left carotid system: Atheromatous wall thickening. Calcified plaque at the proximal ICA without associated stenosis. Subsequently is a vessel kink which narrows the lumen to approximately 55%. No ulceration or dissection. Vertebral arteries: Proximal subclavian atherosclerosis and tortuosity without flow limiting stenosis. The right vertebral artery is dominant. Calcified plaque at the right vertebral origin causing 50% stenosis based on sagittal reformats. No dissection or beading. Skeleton: No acute or aggressive finding. Other neck: Negative Upper chest: Small pleural effusions. Airway cuffing and interlobular septal thickening. Review of the MIP images confirms the above findings CTA HEAD FINDINGS Anterior circulation: Extensive atheromatous plaque along the carotid siphons. No significant stenosis, proximal occlusion, aneurysm, or vascular malformation. Posterior circulation: No significant stenosis, proximal occlusion, aneurysm, or vascular malformation. Venous sinuses: Diffusely patent Anatomic variants: None significant Review of the MIP images confirms the above findings IMPRESSION: 1. No emergent finding. 2. Atherosclerosis with ~50% narrowing at  the bilateral proximal ICA and right vertebral origin. 3. Probable CHF. Electronically Signed   By: Jorje Guild M.D.   On: 09/29/2021 08:56    EKG: Sinus rhythm with frequent PVCs.  Poor R wave progression.  Lateral T wave abnormality.  ASSESSMENT AND PLAN:  Jayr Lupercio is a 88 year old male with a history of HFrEF (EF 30%), mild aortic stenosis, CAD (CTO LAD, moderate to severe disease in OM), moderate pulmonary hypertension, hypertension, myasthenia gravis who was admitted to the hospital with shortness of breath.  Cardiology is consulted for management of acute on chronic congestive heart failure.  # Heart failure with reduced EF #Acute hypoxic and hypercapnic respiratory failure EF previously estimated at 30%, with moderate pulmonary hypertension, and mild aortic stenosis.  He has been intermittently compliant recently with his Lasix, and in addition has been enjoying holiday meals with significant increase in salt intake as a result. -Continue diuresis with IV Lasix 40 mg IV twice daily.  Goal net -2 L today.  May require a third dose of Lasix 40 mg IV. -Beta-blocker contraindicated in myasthenia gravis -He is relatively hypotensive currently, but will attempt to initiate ACE/ARB later in his hospital course for afterload reduction and GDMT. -Wean BiPAP as tolerated.  #CAD Underwent heart catheterization in 2015 which showed a CTO of his mid LAD, and severe disease in his OM.  This appears to have been medically managed.  Not currently having chest pain. -EKG shows poor R wave progression consistent with prior septal infarct. -Continue aspirin 81 mg indefinitely -Continue pravastatin 20 mg daily.  Given his age and comorbidities, we will not change this to a high intensity statin therapy at this time.  Signed: Andrez Grime MD 09/29/2021, 12:17 PM

## 2021-09-29 NOTE — ED Notes (Signed)
This RN in room to address pt visitor concerns. Pt states that he was having a bad dream. Visitor states that pt is altered. Pt able to answer orientation questions but does appear to have more labored breathing. Pts abdomen remains distended and does feel tight with palpation.

## 2021-09-29 NOTE — Progress Notes (Signed)
Patient currently on BIPAP, NIF and Vital Capacity unable to be performed at this time.

## 2021-09-30 ENCOUNTER — Inpatient Hospital Stay
Admit: 2021-09-30 | Discharge: 2021-09-30 | Disposition: A | Discharge status: Home / Self Care | Payer: Medicare Other | Attending: Internal Medicine | Admitting: Internal Medicine

## 2021-09-30 LAB — BASIC METABOLIC PANEL
Anion gap: 5 (ref 5–15)
BUN: 22 mg/dL (ref 8–23)
CO2: 37 mmol/L — ABNORMAL HIGH (ref 22–32)
Calcium: 8.3 mg/dL — ABNORMAL LOW (ref 8.9–10.3)
Chloride: 96 mmol/L — ABNORMAL LOW (ref 98–111)
Creatinine, Ser: 1.13 mg/dL (ref 0.61–1.24)
GFR, Estimated: >60 mL/min (ref >60)
Glucose, Bld: 111 mg/dL — ABNORMAL HIGH (ref 70–99)
Potassium: 4.2 mmol/L (ref 3.5–5.1)
Sodium: 138 mmol/L (ref 135–145)

## 2021-09-30 LAB — CBC
HCT: 40.4 % (ref 39.0–52.0)
Hemoglobin: 11.9 g/dL — ABNORMAL LOW (ref 13.0–17.0)
MCH: 29 pg (ref 26.0–34.0)
MCHC: 29.5 g/dL — ABNORMAL LOW (ref 30.0–36.0)
MCV: 98.3 fL (ref 80.0–100.0)
Platelets: 174 10*3/uL (ref 150–400)
RBC: 4.11 MIL/uL — ABNORMAL LOW (ref 4.22–5.81)
RDW: 14 % (ref 11.5–15.5)
WBC: 6.8 10*3/uL (ref 4.0–10.5)
nRBC: 0 % (ref 0.0–0.2)

## 2021-09-30 LAB — ECHOCARDIOGRAM COMPLETE
AR max vel: 1.3 cm2
AV Area VTI: 1.69 cm2
AV Area mean vel: 1.29 cm2
AV Mean grad: 3 mmHg
AV Peak grad: 5.3 mmHg
Ao pk vel: 1.15 m/s
Area-P 1/2: 4.8 cm2
Calc EF: 30.2 %
Height: 70 in
MV VTI: 1.02 cm2
S' Lateral: 4.1 cm
Single Plane A2C EF: 7.3 %
Single Plane A4C EF: 53.5 %
Weight: 3188.73 oz

## 2021-09-30 MED ORDER — CHLORHEXIDINE GLUCONATE CLOTH 2 % EX PADS
6.0000 | MEDICATED_PAD | Freq: Every day | CUTANEOUS | Status: DC
  Administered 2021-09-30 – 2021-10-05 (×6): 6 via TOPICAL

## 2021-09-30 MED ORDER — OXYBUTYNIN CHLORIDE 5 MG PO TABS
5.0000 mg | ORAL_TABLET | Freq: Three times a day (TID) | ORAL | Status: DC | PRN
  Administered 2021-09-30 – 2021-10-01 (×2): 5 mg via ORAL
  Filled 2021-09-30 (×4): qty 1

## 2021-09-30 NOTE — Progress Notes (Signed)
Subjective: He feels significantly better than he did yesterday.  Exam: Vitals:   09/30/21 0500 09/30/21 0700  BP:  124/64  Pulse: (!) 58 66  Resp: 15 15  Temp: 98.7 F (37.1 C)   SpO2: 98% 99%   Gen: In bed, NAD Resp: non-labored breathing, no acute distress Abd: soft, nt  Neuro: MS: Awake, alert, interactive and appropriate CN: He has conjugate gaze, pupils are reactive bilaterally.  He does not have disc conjugation with sustained upgaze, nor does he have ptosis.  His palate elevates symmetrically and well.  His smile is normal and not transverse. Motor: he has good strength bilaterally without significant fatigueability.  Sensory:intact to LT  Pertinent Labs: ACHr + from 2014  Impression: 88 yo M with myasthenia gravis(MG) as evidenced by positive Ach-R antibodies in 2014 and consistent symptoms. 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  He has an acute CHF exacerbation. Whether MG is playing a role or not may be difficult to tease out. If treatment is desired, IVIG could be pursued, but would need to be very mindful of ins/outs as there is a significant volume associated with IVIG.  Given that he is improving, and had direct evidence of CHF exacerbation, I would continue treating this rather than jumping ahead with IVIG which might confound the issue.  Recommendations: 1) continue treatment of underlying CHF exacerbation 2) continue home pyridostigmine and mycophenolate   Roland Rack, MD [facility] [phone]  If 7pm- 7am, please page neurology on call as listed in [place].

## 2021-09-30 NOTE — Progress Notes (Signed)
NIF -23  VC 1.29 L

## 2021-09-30 NOTE — Consult Note (Signed)
[facility] Cardiology  CARDIOLOGY CONSULT NOTE  Patient ID: [name] MRN: [id number] DOB/AGE: 1926/08/06 88 y.o.  Admit date: 09/28/2021 Referring Physician Eppie Gibson Primary Physician [name], MD Primary Cardiologist Serafina Royals  Reason for Consultation AoCHF  HPI:  [name] is a 88 year old male with a history of HFrEF (EF 30%), mild aortic stenosis, CAD (CTO LAD, moderate to severe disease in OM), moderate pulmonary hypertension, hypertension, myasthenia gravis who was admitted to the hospital with shortness of breath.  Cardiology is consulted for management of acute on chronic congestive heart failure.  Interval history - On [place] oxygen this morning.  - Diuresed well yesterday with increase in renal function.  - Feels better this morning but not back to normal.   Review of systems complete and found to be negative unless listed above     Past Medical History:  Diagnosis Date   Abnormal chest CT 03/28/2017   Allergy    Anemia    Angina pectoris ([place]) 04/02/2011   Aortic aneurysm ([place]) 03/28/2017   Saw Dr Genevive Bi 04/16/17 - recommended f/u CT in 3 months.     Atherosclerosis of both carotid arteries 07/17/2014   Benign lipomatous neoplasm of skin and subcutaneous tissue of left arm 10/04/2013   Bilateral carotid artery stenosis 08/30/2014   Overview:  Less than 50% 2015   Bladder cancer ([place]) 03/05/2013   CAD (coronary artery disease) 06/18/2014   Cancer ([place]) 11-24-12   bladder. BCG treatments by Dr Jacqlyn Larsen.   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 WD SCC. Re-shaved 01/14/2016. Excised 02/05/2016, margins free.   Squamous cell carcinoma of skin 11/03/2018   Right cheek ant. to sideburn. Poorly differentiated.   Squamous cell carcinoma of skin 02/02/2019   Right mid dorsum forearm. WD SCC. EDC.    Past Surgical History:  Procedure Laterality Date   [place]    Medications Prior to Admission  Medication Sig Dispense Refill Last Dose   aspirin 81 MG tablet Take 81 mg by mouth daily.   09/28/2021   finasteride (PROSCAR) 5 MG tablet Take 5 mg by mouth daily.   09/28/2021   hydroxypropyl methylcellulose / hypromellose (ISOPTO TEARS / GONIOVISC) 2.5 % ophthalmic solution Place 1 drop into both eyes daily.   09/28/2021   mycophenolate (CELLCEPT) 250 MG capsule Take 1,000 mg by mouth 2 (two) times daily.    09/28/2021   pravastatin (PRAVACHOL) 20 MG tablet Take 1 tablet (20 mg total) by mouth daily. 90 tablet 1 09/28/2021   prednisoLONE acetate (PRED FORTE) 1 % ophthalmic suspension Place 1 drop into the right eye daily. Per patient. 5  mL 0 09/28/2021   pyridostigmine (MESTINON) 60 MG tablet Take 30 mg by mouth 6 (six) times daily.    09/28/2021   tamsulosin (FLOMAX) 0.4 MG CAPS Take 0.4 mg by mouth daily.   09/28/2021   furosemide (LASIX) 40 MG tablet Take 1 tablet (40 mg total) by mouth daily. 90 tablet 3    tacrolimus (PROTOPIC) 0.1 % ointment Apply topically 2 (two) times daily. 100 g 2     Social History   Socioeconomic History   Marital status: Married    Spouse name: Not on file   Number of children: Not on file   Years of education:  Not on file   Highest education level: Not on file  Occupational History   Not on file  Tobacco Use   Smoking status: Former    Types: Cigarettes    Quit date: 09/29/1958    Years since quitting: 63.0   Smokeless tobacco: Never  Vaping Use   Vaping Use: Never used  Substance and Sexual Activity   Alcohol use: Yes    Alcohol/week: 0.0 standard drinks    Comment: occasionally   Drug use: No   Sexual activity: Never  Other Topics Concern   Not on file  Social History Narrative   Pt is married with 2 children - 1 son, 1 daughter. Previously self-employed in [place] Strain: Low Risk    Difficulty of Paying Living Expenses: Not hard at all  Food Insecurity: No Food Insecurity   Worried About Charity fundraiser in the Last Year: Never true   Arboriculturist in the Last Year: Never true  Transportation Needs: No Transportation Needs   Lack of Transportation (Medical): No   Lack of Transportation (Non-Medical): No  Physical Activity: Not on file  Stress: No Stress Concern Present   Feeling of Stress : Not at all  Social Connections: Unknown   Frequency of Communication with Friends and Family: More than three times a week   Frequency of Social Gatherings with Friends and Family: More than three times a week   Attends Religious Services: Not on file   Active Member of Clubs or Organizations: Not on file   Attends Archivist Meetings: Not on file   Marital Status: Widowed  Human resources officer Violence: Not At Risk   Fear of Current or Ex-Partner: No   Emotionally Abused: No   Physically Abused: No   Sexually Abused: No    Family History  Problem Relation Age of Onset   Lung cancer Sister    Prostate cancer Brother    Lung cancer Brother    Arthritis Other        parent   Colon cancer Neg Hx       Review of systems complete and found to be negative unless listed above      PHYSICAL  EXAM  General:  sitting at side of bed. Appears tired but comfortable.  HEENT:  Normocephalic and atramatic Neck:  JVD elevated at 30 degrees Lungs: Course breath sounds throughout. On BiPAP. Heart: HRRR, frequent extra beats. Difficult to hear m/r/g over breath sounds. Abdomen: Bowel sounds are positive, abdomen soft and non-tender  Msk:  Back normal, normal gait. Normal strength and tone for age. Extremities: Compression stockings in place. 2+ LE edema   Neuro: No focal deficit Psych:  Responds appropriately  Labs:   Lab Results  Component Value Date  WBC 6.8 09/30/2021   HGB 11.9 (L) 09/30/2021   HCT 40.4 09/30/2021   MCV 98.3 09/30/2021   PLT 174 09/30/2021    Recent Labs  Lab 09/29/21 0500 09/30/21 0555  NA 137 138  K 4.5 4.2  CL 99 96*  CO2 29 37*  BUN 21 22  CREATININE 0.99 1.13  CALCIUM 8.8* 8.3*  PROT 6.8  --   BILITOT 1.1  --   ALKPHOS 73  --   ALT 21  --   AST 23  --   GLUCOSE 114* 111*    Lab Results  Component Value Date   TROPONINI 0.43 (HH) 12/29/2016     Lab Results  Component Value Date   CHOL 140 02/22/2021   CHOL 159 11/15/2020   CHOL 155 07/16/2020   Lab Results  Component Value Date   HDL 44.70 02/22/2021   HDL 57.20 11/15/2020   HDL 53.80 07/16/2020   Lab Results  Component Value Date   LDLCALC 71 02/22/2021   LDLCALC 87 11/15/2020   LDLCALC 86 07/16/2020   Lab Results  Component Value Date   TRIG 121.0 02/22/2021   TRIG 74.0 11/15/2020   TRIG 77.0 07/16/2020   Lab Results  Component Value Date   CHOLHDL 3 02/22/2021   CHOLHDL 3 11/15/2020   CHOLHDL 3 07/16/2020   Lab Results  Component Value Date   LDLDIRECT 143.5 08/26/2013      Radiology: DG Chest 1 View  Result Date: 09/29/2021 CLINICAL DATA:  Shortness of breath. Patient has a history of congestive heart failure. EXAM: CHEST  1 VIEW COMPARISON:  September 28, 2021 5:44 p.m. FINDINGS: The heart size is enlarged. Mediastinal contour is stable. There is  increased pulmonary interstitium bilaterally worsened compared prior exam. Probable small left pleural effusion is identified. Osseous structures are stable. IMPRESSION: Congestive heart failure worsened compared prior exam. Electronically Signed   By: Abelardo Diesel M.D.   On: 09/29/2021 09:27   DG Chest 2 View  Result Date: 09/28/2021 CLINICAL DATA:  Short of breath for the past 3 days. EXAM: CHEST - 2 VIEW COMPARISON:  01/13/2020. FINDINGS: Cardiac silhouette mildly enlarged.  No mediastinal or hilar masses. Bilateral interstitial thickening. Additional opacities noted in the left lung base, likely atelectasis. No pleural effusion.  No pneumothorax. Skeletal structures are demineralized, but grossly intact. IMPRESSION: 1. Cardiomegaly with interstitial thickening. Suspect mild congestive heart failure. Electronically Signed   By: Lajean Manes M.D.   On: 09/28/2021 18:22   CT HEAD CODE STROKE WO CONTRAST  Result Date: 09/29/2021 CLINICAL DATA:  Code stroke. EXAM: CT HEAD WITHOUT CONTRAST TECHNIQUE: Contiguous axial images were obtained from the base of the skull through the vertex without intravenous contrast. COMPARISON:  None. FINDINGS: Brain: No evidence of acute infarction, hemorrhage, hydrocephalus, extra-axial collection or mass lesion/mass effect. Vascular: No hyperdense vessel or unexpected calcification. Skull: Normal. Negative for fracture or focal lesion. Sinuses/Orbits: No acute finding Other: These results were communicated to Dr. Rory Percy at 8:30 am on 09/29/2021 by text page via the [facility] messaging system. ASPECTS [facility] Stroke Program Early CT Score) Not scored without localizing symptom IMPRESSION: Motion degraded head CT without acute finding. Electronically Signed   By: Jorje Guild M.D.   On: 09/29/2021 08:31   CT ANGIO HEAD NECK W WO CM W PERF (CODE STROKE)  Result Date: 09/29/2021 CLINICAL DATA:  Acute stroke suspected EXAM: CT ANGIOGRAPHY HEAD AND NECK TECHNIQUE: Multidetector CT  imaging of the head and neck was performed  using the standard protocol during bolus administration of intravenous contrast. Multiplanar CT image reconstructions and MIPs were obtained to evaluate the vascular anatomy. Carotid stenosis measurements (when applicable) are obtained utilizing NASCET criteria, using the distal internal carotid diameter as the denominator. CONTRAST:  Dose is not known on this in progress study COMPARISON:  Noncontrast head CT earlier the same day FINDINGS: CTA NECK FINDINGS Aortic arch: Elongated and atheromatous aorta. Right carotid system: Diffuse atheromatous plaque. Mixed density, mainly calcified plaque at the bifurcation and bulb with proximal ICA stenosis measuring up to 50%. Left carotid system: Atheromatous wall thickening. Calcified plaque at the proximal ICA without associated stenosis. Subsequently is a vessel kink which narrows the lumen to approximately 55%. No ulceration or dissection. Vertebral arteries: Proximal subclavian atherosclerosis and tortuosity without flow limiting stenosis. The right vertebral artery is dominant. Calcified plaque at the right vertebral origin causing 50% stenosis based on sagittal reformats. No dissection or beading. Skeleton: No acute or aggressive finding. Other neck: Negative Upper chest: Small pleural effusions. Airway cuffing and interlobular septal thickening. Review of the MIP images confirms the above findings CTA HEAD FINDINGS Anterior circulation: Extensive atheromatous plaque along the carotid siphons. No significant stenosis, proximal occlusion, aneurysm, or vascular malformation. Posterior circulation: No significant stenosis, proximal occlusion, aneurysm, or vascular malformation. Venous sinuses: Diffusely patent Anatomic variants: None significant Review of the MIP images confirms the above findings IMPRESSION: 1. No emergent finding. 2. Atherosclerosis with ~50% narrowing at the bilateral proximal ICA and right vertebral origin.  3. Probable CHF. Electronically Signed   By: Jorje Guild M.D.   On: 09/29/2021 08:56    EKG: Sinus rhythm with frequent PVCs.  Poor R wave progression.  Lateral T wave abnormality.  ASSESSMENT AND PLAN:  [name] is a 88 year old male with a history of HFrEF (EF 30%), mild aortic stenosis, CAD (CTO LAD, moderate to severe disease in OM), moderate pulmonary hypertension, hypertension, myasthenia gravis who was admitted to the hospital with shortness of breath.  Cardiology is consulted for management of acute on chronic congestive heart failure. Has improved with diuresis but may also have exacerbation of MG playing a role.   # Heart failure with reduced EF #Acute hypoxic and hypercapnic respiratory failure # Myasthenia gravis EF previously estimated at 30%, with moderate pulmonary hypertension, and mild aortic stenosis.  He has been intermittently compliant recently with his Lasix, and in addition has been enjoying holiday meals with significant increase in salt intake as a result. He has improved some with diuresis.  - Appreciate neurology input  -Lasix 40 mg IV this morning; hold afternoon dose. Reassess tomorrow.  -Beta-blocker contraindicated in myasthenia gravis -He is relatively hypotensive currently, but will attempt to initiate ACE/ARB later in his hospital course for afterload reduction and GDMT. -Wean BiPAP as tolerated.  #CAD Underwent heart catheterization in 2015 which showed a CTO of his mid LAD, and severe disease in his OM.  This appears to have been medically managed.  Not currently having chest pain. -EKG shows poor R wave progression consistent with prior septal infarct. -Continue aspirin 81 mg indefinitely -Continue pravastatin 20 mg daily.  Given his age and comorbidities, we will not change this to a high intensity statin therapy at this time.  Signed: Andrez Grime MD 09/30/2021, 10:39 AM

## 2021-09-30 NOTE — Progress Notes (Signed)
PROGRESS NOTE    [name]  XBL:[id number] DOB: July 20, 1926 DOA: 09/28/2021 PCP: [name], MD    Assessment & Plan:   Principal Problem:   Acute on chronic combined systolic and diastolic CHF (congestive heart failure) ([place]) Active Problems:   Coronary artery disease involving native coronary artery of native heart without angina pectoris   Acute respiratory failure with hypoxia (HCC)   Essential hypertension   Mixed hyperlipidemia   Lambert-Eaton myasthenic syndrome (HCC)   Benign prostatic hyperplasia  Acute on chronic combined CHF exacerbation: w/ increasing peripheral edema, dyspnea on exertion & weight gain. Dietary indiscretions and intermittent noncompliance w/ lasix. Monitor I/Os and daily weights. Neg approx 1.6L. Continue on IV lasix. Cardio following and recs apprec   Acute hypoxic & hypercarbic respiratory failure: likely secondary to above. Weaned off bipap and now supplemental oxygen, currently 4L [place]   Hx of CAD: denies chest pain. Continue on statin, aspirin    Myasthenia gravis & [name]: continue on home dose of pyridostigmine, cellcept   HLD: continue on statin   BPH: continue on home dose of proscar, flomax     DVT prophylaxis: lovenox  Code Status: full  Family Communication: discussed pt's care w/ pt's family at bedside and answered their questions  Disposition Plan: depends on PT/OT recs   Level of care: Telemetry Cardiac  Status is: Inpatient  Remains inpatient appropriate because: severity of illness, still requiring supplemental oxygen     Consultants:  Neuro Cardio ICU  Procedures:   Antimicrobials:   Subjective: Pt c/o shortness of breath, improved from day prior   Objective: Vitals:   09/30/21 0400 09/30/21 0452 09/30/21 0500 09/30/21 0700  BP: (!) 97/46   124/64  Pulse: (!) 44  (!) 58 66  Resp: 19  15 15   Temp:   98.7 F (37.1 C)   TempSrc:    Oral  SpO2: 97% 97% 98% 99%  Weight:   90.4 kg    Height:        Intake/Output Summary (Last 24 hours) at 09/30/2021 1455 Last data filed at 09/30/2021 0600 Gross per 24 hour  Intake --  Output 1600 ml  Net -1600 ml   Filed Weights   09/28/21 1739 09/30/21 0500  Weight: 93.4 kg 90.4 kg    Examination:  General exam: Appears calm & comfortable.   Respiratory system: diminished breath sounds b/l Cardiovascular system: S1/S2+. No rubs or clicks  Gastrointestinal system: Abd is soft, NT, ND & normal bowel sounds  Central nervous system: Alert and awake. Moves all extremities  Psychiatry: Judgement and insight appears normal. Flat mood and affect     Data Reviewed: I have personally reviewed following labs and imaging studies  CBC: Recent Labs  Lab 09/28/21 1751 09/29/21 0500 09/30/21 0555  WBC 6.7 6.8 6.8  NEUTROABS  --  5.3  --   HGB 13.4 13.5 11.9*  HCT 43.8 44.6 40.4  MCV 96.1 97.4 98.3  PLT 200 207 300   Basic Metabolic Panel: Recent Labs  Lab 09/28/21 1751 09/29/21 0500 09/30/21 0555  NA 139 137 138  K 4.4 4.5 4.2  CL 103 99 96*  CO2 29 29 37*  GLUCOSE 142* 114* 111*  BUN 22 21 22   CREATININE 0.95 0.99 1.13  CALCIUM 8.3* 8.8* 8.3*  MG  --  2.1  --    GFR: Estimated Creatinine Clearance: 46.2 mL/min (by C-G formula based on SCr of 1.13 mg/dL). Liver Function Tests: Recent Labs  Lab 09/29/21 0500  AST 23  ALT 21  ALKPHOS 73  BILITOT 1.1  PROT 6.8  ALBUMIN 3.9   No results for input(s): LIPASE, AMYLASE in the last 168 hours. No results for input(s): AMMONIA in the last 168 hours. Coagulation Profile: Recent Labs  Lab 09/29/21 1528  INR 1.1   Cardiac Enzymes: No results for input(s): CKTOTAL, CKMB, CKMBINDEX, TROPONINI in the last 168 hours. BNP (last 3 results) No results for input(s): PROBNP in the last 8760 hours. HbA1C: No results for input(s): HGBA1C in the last 72 hours. CBG: Recent Labs  Lab 09/29/21 0845  GLUCAP 108*   Lipid Profile: No results for input(s): CHOL, HDL,  LDLCALC, TRIG, CHOLHDL, LDLDIRECT in the last 72 hours. Thyroid Function Tests: Recent Labs    09/29/21 1528  TSH 1.543  FREET4 1.12   Anemia Panel: No results for input(s): VITAMINB12, FOLATE, FERRITIN, TIBC, IRON, RETICCTPCT in the last 72 hours. Sepsis Labs: Recent Labs  Lab 09/29/21 1204  PROCALCITON <0.10    Recent Results (from the past 240 hour(s))  Resp Panel by RT-PCR (Flu A&B, Covid) Nasopharyngeal Swab     Status: None   Collection Time: 09/28/21  5:51 PM   Specimen: Nasopharyngeal Swab; Nasopharyngeal(NP) swabs in vial transport medium  Result Value Ref Range Status   SARS Coronavirus 2 by RT PCR NEGATIVE NEGATIVE Final    Comment: (NOTE) SARS-CoV-2 target nucleic acids are NOT DETECTED.  The SARS-CoV-2 RNA is generally detectable in upper respiratory specimens during the acute phase of infection. The lowest concentration of SARS-CoV-2 viral copies this assay can detect is 138 copies/mL. A negative result does not preclude SARS-Cov-2 infection and should not be used as the sole basis for treatment or other patient management decisions. A negative result may occur with  improper specimen collection/handling, submission of specimen other than nasopharyngeal swab, presence of viral mutation(s) within the areas targeted by this assay, and inadequate number of viral copies(<138 copies/mL). A negative result must be combined with clinical observations, patient history, and epidemiological information. The expected result is Negative.  Fact Sheet for Patients:  EntrepreneurPulse.com.au  Fact Sheet for Healthcare Providers:  IncredibleEmployment.be  This test is no t yet approved or cleared by the [place] FDA and  has been authorized for detection and/or diagnosis of SARS-CoV-2 by FDA under an Emergency Use Authorization (EUA). This EUA will remain  in effect (meaning this test can be used) for the duration of the COVID-19  declaration under Section 564(b)(1) of the Act, 21 U.S.C.section 360bbb-3(b)(1), unless the authorization is terminated  or revoked sooner.       Influenza A by PCR NEGATIVE NEGATIVE Final   Influenza B by PCR NEGATIVE NEGATIVE Final    Comment: (NOTE) The Xpert Xpress SARS-CoV-2/FLU/RSV plus assay is intended as an aid in the diagnosis of influenza from Nasopharyngeal swab specimens and should not be used as a sole basis for treatment. Nasal washings and aspirates are unacceptable for Xpert Xpress SARS-CoV-2/FLU/RSV testing.  Fact Sheet for Patients: EntrepreneurPulse.com.au  Fact Sheet for Healthcare Providers: IncredibleEmployment.be  This test is not yet approved or cleared by the [place] FDA and has been authorized for detection and/or diagnosis of SARS-CoV-2 by FDA under an Emergency Use Authorization (EUA). This EUA will remain in effect (meaning this test can be used) for the duration of the COVID-19 declaration under Section 564(b)(1) of the Act, 21 U.S.C. section 360bbb-3(b)(1), unless the authorization is terminated or revoked.  Performed at [facility],  [place], [place] [address]          Radiology Studies: DG Chest 1 View  Result Date: 09/29/2021 CLINICAL DATA:  Shortness of breath. Patient has a history of congestive heart failure. EXAM: CHEST  1 VIEW COMPARISON:  September 28, 2021 5:44 p.m. FINDINGS: The heart size is enlarged. Mediastinal contour is stable. There is increased pulmonary interstitium bilaterally worsened compared prior exam. Probable small left pleural effusion is identified. Osseous structures are stable. IMPRESSION: Congestive heart failure worsened compared prior exam. Electronically Signed   By: Abelardo Diesel M.D.   On: 09/29/2021 09:27   DG Chest 2 View  Result Date: 09/28/2021 CLINICAL DATA:  Short of breath for the past 3 days. EXAM: CHEST - 2 VIEW COMPARISON:   01/13/2020. FINDINGS: Cardiac silhouette mildly enlarged.  No mediastinal or hilar masses. Bilateral interstitial thickening. Additional opacities noted in the left lung base, likely atelectasis. No pleural effusion.  No pneumothorax. Skeletal structures are demineralized, but grossly intact. IMPRESSION: 1. Cardiomegaly with interstitial thickening. Suspect mild congestive heart failure. Electronically Signed   By: Lajean Manes M.D.   On: 09/28/2021 18:22   ECHOCARDIOGRAM COMPLETE  Result Date: 09/30/2021    ECHOCARDIOGRAM REPORT   Patient Name:   [name] Date of Exam: 09/30/2021 Medical Rec #:  [id number]       Height:       70.0 in Accession #:    [id number]      Weight:       199.3 lb Date of Birth:  1925/11/23       BSA:          2.084 m Patient Age:    88 years        BP:           124/64 mmHg Patient Gender: M               HR:           66 bpm. Exam Location:  [facility] Procedure: 2D Echo, Cardiac Doppler and Color Doppler Indications:     CHF-acute systolic V56.43  History:         Patient has prior history of Echocardiogram examinations, most                  recent 01/15/2020. CAD; Risk Factors:Hypertension.  Sonographer:     Sherrie Sport Referring Phys:  [id number] [place] Diagnosing Phys: Donnelly Angelica  Sonographer Comments: Suboptimal apical window. IMPRESSIONS  1. Left ventricular ejection fraction, by estimation, is 30 to 35%. The left ventricle has moderately decreased function. The left ventricle demonstrates regional wall motion abnormalities (see scoring diagram/findings for description). Left ventricular  diastolic parameters are consistent with Grade II diastolic dysfunction (pseudonormalization).  2. Right ventricular systolic function is normal. The right ventricular size is not well visualized.  3. Left atrial size was severely dilated.  4. The mitral valve is normal in structure. Mild mitral valve regurgitation. No evidence of mitral stenosis.  5. The aortic valve is calcified. Aortic  valve regurgitation is not visualized. Aortic valve sclerosis/calcification is present, without any evidence of aortic stenosis. Comparison(s): No significant change from prior study. FINDINGS  Left Ventricle: Left ventricular ejection fraction, by estimation, is 30 to 35%. The left ventricle has moderately decreased function. The left ventricle demonstrates regional wall motion abnormalities. Severe hypokinesis of the left ventricular, entire  apical segment and anteroseptal wall. The left ventricular internal cavity size was normal in size.  There is no left ventricular hypertrophy. Left ventricular diastolic parameters are consistent with Grade II diastolic dysfunction (pseudonormalization). Right Ventricle: The right ventricular size is not well visualized. Right vetricular wall thickness was not well visualized. Right ventricular systolic function is normal. Left Atrium: Left atrial size was severely dilated. Right Atrium: Right atrial size was normal in size. Pericardium: There is no evidence of pericardial effusion. Mitral Valve: The mitral valve is normal in structure. Mild mitral valve regurgitation. No evidence of mitral valve stenosis. MV peak gradient, 6.1 mmHg. The mean mitral valve gradient is 2.0 mmHg. Tricuspid Valve: The tricuspid valve is not well visualized. Tricuspid valve regurgitation is trivial. Aortic Valve: The aortic valve is calcified. Aortic valve regurgitation is not visualized. Aortic valve sclerosis/calcification is present, without any evidence of aortic stenosis. Aortic valve mean gradient measures 3.0 mmHg. Aortic valve peak gradient measures 5.3 mmHg. Aortic valve area, by VTI measures 1.69 cm. Pulmonic Valve: The pulmonic valve was not well visualized. Pulmonic valve regurgitation is mild. No evidence of pulmonic stenosis. Aorta: The aortic root and ascending aorta are structurally normal, with no evidence of dilitation. IAS/Shunts: The interatrial septum was not well visualized.   LEFT VENTRICLE PLAX 2D LVIDd:         5.00 cm      Diastology LVIDs:         4.10 cm      LV e' medial:    4.03 cm/s LV PW:         1.30 cm      LV E/e' medial:  21.3 LV IVS:        1.50 cm      LV e' lateral:   8.59 cm/s LVOT diam:     2.00 cm      LV E/e' lateral: 10.0 LV SV:         36 LV SV Index:   17 LVOT Area:     3.14 cm  LV Volumes (MOD) LV vol d, MOD A2C: 178.0 ml LV vol d, MOD A4C: 207.0 ml LV vol s, MOD A2C: 165.0 ml LV vol s, MOD A4C: 96.2 ml LV SV MOD A2C:     13.0 ml LV SV MOD A4C:     207.0 ml LV SV MOD BP:      57.3 ml RIGHT VENTRICLE RV S prime:     18.00 cm/s TAPSE (M-mode): 4.8 cm LEFT ATRIUM              Index        RIGHT ATRIUM           Index LA diam:        5.30 cm  2.54 cm/m   RA Area:     17.30 cm LA Vol (A2C):   101.0 ml 48.46 ml/m  RA Volume:   48.50 ml  23.27 ml/m LA Vol (A4C):   127.0 ml 60.93 ml/m LA Biplane Vol: 118.0 ml 56.62 ml/m  AORTIC VALVE                    PULMONIC VALVE AV Area (Vmax):    1.30 cm     PV Vmax:        0.78 m/s AV Area (Vmean):   1.29 cm     PV Vmean:       53.600 cm/s AV Area (VTI):     1.69 cm     PV VTI:         0.152  m AV Vmax:           114.67 cm/s  PV Peak grad:   2.4 mmHg AV Vmean:          76.567 cm/s  PV Mean grad:   1.5 mmHg AV VTI:            0.214 m      RVOT Peak grad: 4 mmHg AV Peak Grad:      5.3 mmHg AV Mean Grad:      3.0 mmHg LVOT Vmax:         47.60 cm/s LVOT Vmean:        31.500 cm/s LVOT VTI:          0.115 m LVOT/AV VTI ratio: 0.54  AORTA Ao Root diam: 2.93 cm MITRAL VALVE               TRICUSPID VALVE MV Area (PHT): 4.80 cm    TR Peak grad:   27.2 mmHg MV Area VTI:   1.02 cm    TR Vmax:        261.00 cm/s MV Peak grad:  6.1 mmHg MV Mean grad:  2.0 mmHg    SHUNTS MV Vmax:       1.23 m/s    Systemic VTI:  0.12 m MV Vmean:      61.9 cm/s   Systemic Diam: 2.00 cm MV Decel Time: 158 msec    Pulmonic VTI:  0.217 m MV E velocity: 85.70 cm/s MV A velocity: 99.40 cm/s MV E/A ratio:  0.86 Donnelly Angelica Electronically signed by Donnelly Angelica  Signature Date/Time: 09/30/2021/12:59:07 PM    Final    CT HEAD CODE STROKE WO CONTRAST  Result Date: 09/29/2021 CLINICAL DATA:  Code stroke. EXAM: CT HEAD WITHOUT CONTRAST TECHNIQUE: Contiguous axial images were obtained from the base of the skull through the vertex without intravenous contrast. COMPARISON:  None. FINDINGS: Brain: No evidence of acute infarction, hemorrhage, hydrocephalus, extra-axial collection or mass lesion/mass effect. Vascular: No hyperdense vessel or unexpected calcification. Skull: Normal. Negative for fracture or focal lesion. Sinuses/Orbits: No acute finding Other: These results were communicated to Dr. Rory Percy at 8:30 am on 09/29/2021 by text page via the [facility] messaging system. ASPECTS [facility] Stroke Program Early CT Score) Not scored without localizing symptom IMPRESSION: Motion degraded head CT without acute finding. Electronically Signed   By: Jorje Guild M.D.   On: 09/29/2021 08:31   CT ANGIO HEAD NECK W WO CM W PERF (CODE STROKE)  Result Date: 09/29/2021 CLINICAL DATA:  Acute stroke suspected EXAM: CT ANGIOGRAPHY HEAD AND NECK TECHNIQUE: Multidetector CT imaging of the head and neck was performed using the standard protocol during bolus administration of intravenous contrast. Multiplanar CT image reconstructions and MIPs were obtained to evaluate the vascular anatomy. Carotid stenosis measurements (when applicable) are obtained utilizing NASCET criteria, using the distal internal carotid diameter as the denominator. CONTRAST:  Dose is not known on this in progress study COMPARISON:  Noncontrast head CT earlier the same day FINDINGS: CTA NECK FINDINGS Aortic arch: Elongated and atheromatous aorta. Right carotid system: Diffuse atheromatous plaque. Mixed density, mainly calcified plaque at the bifurcation and bulb with proximal ICA stenosis measuring up to 50%. Left carotid system: Atheromatous wall thickening. Calcified plaque at the proximal ICA without associated stenosis.  Subsequently is a vessel kink which narrows the lumen to approximately 55%. No ulceration or dissection. Vertebral arteries: Proximal subclavian atherosclerosis and tortuosity without flow limiting stenosis. The right vertebral artery is  dominant. Calcified plaque at the right vertebral origin causing 50% stenosis based on sagittal reformats. No dissection or beading. Skeleton: No acute or aggressive finding. Other neck: Negative Upper chest: Small pleural effusions. Airway cuffing and interlobular septal thickening. Review of the MIP images confirms the above findings CTA HEAD FINDINGS Anterior circulation: Extensive atheromatous plaque along the carotid siphons. No significant stenosis, proximal occlusion, aneurysm, or vascular malformation. Posterior circulation: No significant stenosis, proximal occlusion, aneurysm, or vascular malformation. Venous sinuses: Diffusely patent Anatomic variants: None significant Review of the MIP images confirms the above findings IMPRESSION: 1. No emergent finding. 2. Atherosclerosis with ~50% narrowing at the bilateral proximal ICA and right vertebral origin. 3. Probable CHF. Electronically Signed   By: Jorje Guild M.D.   On: 09/29/2021 08:56        Scheduled Meds:  aspirin EC  81 mg Oral Daily   Chlorhexidine Gluconate Cloth  6 each Topical Daily   enoxaparin (LOVENOX) injection  40 mg Subcutaneous Q24H   finasteride  5 mg Oral Daily   mycophenolate  1,000 mg Oral BID   polyvinyl alcohol  1 drop Both Eyes Daily   pravastatin  20 mg Oral q1800   prednisoLONE acetate  1 drop Right Eye Daily   pyridostigmine  30 mg Oral 6 X Daily   tacrolimus   Topical BID   tamsulosin  0.4 mg Oral Daily   Continuous Infusions:   LOS: 2 days    Time spent: 25 mins     Wyvonnia Dusky, MD [facility] Pager [phone]  If 7PM-7AM, please contact night-coverage 09/30/2021, 2:55 PM

## 2021-09-30 NOTE — Progress Notes (Signed)
NIF -20, VC 1.14L

## 2021-09-30 NOTE — Evaluation (Signed)
Occupational Therapy Evaluation Patient Details Name: [name] MRN: [id number] DOB: 1925-10-07 Today's Date: 09/30/2021   History of Present Illness 88 year old male with a history of HFrEF (EF 30%), mild aortic stenosis, CAD (CTO LAD, moderate to severe disease in OM), moderate pulmonary hypertension, hypertension, myasthenia gravis who was admitted to the hospital with shortness of breath. During hospital cource was noted to be less responsive and nonverbal with a code stroke initiated. Imaging unremarkable.   Clinical Impression   Pt was seen for OT evaluation this date. Prior to hospital admission, pt was independent with ADL, only receiving assist from family for groceries. Dtr present and reports that she is able to assist as needed upon discharge. Currently pt demonstrates impairments as described below (See OT problem list) which functionally limit his ability to perform ADL/self-care tasks. Pt currently requires CGA for ADL transfers + RW, PRN MIN A for LB ADL, and supv for standing grooming tasks at the sink. Intermittent UE support on counter for static standing balance/stability. Pt would benefit from skilled OT services to address noted impairments and functional limitations (see below for any additional details) in order to maximize safety and independence while minimizing falls risk and caregiver burden. Upon hospital discharge, recommend HHOT to maximize pt safety and return to functional independence during meaningful occupations of daily life.    Recommendations for follow up therapy are one component of a multi-disciplinary discharge planning process, led by the attending physician.  Recommendations may be updated based on patient status, additional functional criteria and insurance authorization.   Follow Up Recommendations  Home health OT    Assistance Recommended at Discharge Frequent or constant Supervision/Assistance  Functional Status Assessment  Patient has had a  recent decline in their functional status and demonstrates the ability to make significant improvements in function in a reasonable and predictable amount of time.  Equipment Recommendations  None recommended by OT    Recommendations for Other Services       Precautions / Restrictions Precautions Precautions: Fall Precaution Comments: monitor O2 Restrictions Weight Bearing Restrictions: No      Mobility Bed Mobility Overal bed mobility: Modified Independent             General bed mobility comments: increased time/effort, use of bed rails    Transfers Overall transfer level: Needs assistance Equipment used: Rolling walker (2 wheels) Transfers: Sit to/from Stand Sit to Stand: Supervision           General transfer comment: Cues for sequencing and CGA      Balance Overall balance assessment: Needs assistance Sitting-balance support: Feet supported Sitting balance-Leahy Scale: Good Sitting balance - Comments: good posture in seated position, able to move LE's without LOB   Standing balance support: Bilateral upper extremity supported;During functional activity;Single extremity supported;No upper extremity supported Standing balance-Leahy Scale: Fair Standing balance comment: fair static standing balance while at sink for grooming tasks with intermittent UE Support on the counter for additional stability                           ADL either performed or assessed with clinical judgement   ADL Overall ADL's : Needs assistance/impaired     Grooming: Standing;Set up;Supervision/safety;Wash/dry face;Oral care Grooming Details (indicate cue type and reason): PRN UE Support on counter                 Toilet Transfer: Rolling walker (2 wheels);Min guard Toilet Transfer Details (indicate cue type  and reason): BSC over toilet         Functional mobility during ADLs: Min guard;Rolling walker (2 wheels)       Vision         Perception      Praxis      Pertinent Vitals/Pain Pain Assessment: No/denies pain     Hand Dominance Right   Extremity/Trunk Assessment Upper Extremity Assessment Upper Extremity Assessment: Overall WFL for tasks assessed   Lower Extremity Assessment Lower Extremity Assessment: Defer to PT evaluation RLE Deficits / Details: grossly 4/5 RLE Sensation: WNL RLE Coordination: WNL LLE Deficits / Details: grossly 4-/5 LLE Sensation: WNL LLE Coordination: WNL       Communication Communication Communication: No difficulties   Cognition Arousal/Alertness: Awake/alert Behavior During Therapy: WFL for tasks assessed/performed Overall Cognitive Status: Within Functional Limits for tasks assessed                                 General Comments: Patient A and O x 4     General Comments  Patient appears well nourished and groomed.    Exercises  Other Exercises: Pt/dtr educated in role of OT, AE/DME for ADL, falls prevention, and energy conservation strategies to support improved safety/indep with ADL/IADL at home while minimizing SOB/over exertion.   Shoulder Instructions      Home Living Family/patient expects to be discharged to:: Private residence Living Arrangements: Alone (children live nearby and can stay with him if needed) Available Help at Discharge: Family Type of Home: House Home Access: Stairs to enter [employer] of Steps: 4 Entrance Stairs-Rails: [place]: One level     Bathroom Shower/Tub: Occupational psychologist: [place] - single point;Grab bars - tub/shower;Shower seat;Hand held shower head   Additional Comments: Patient has lived in house since the 1950's. Has two children who can help and stay with him after discharge.      Prior Functioning/Environment Prior Level of Function : Independent/Modified Independent;Driving;Needs assist             Mobility Comments: Patient drives,  walks with SPC, and is independent at baseline. ADLs Comments: per patient is independent with ADL, family assists with groceries        OT Problem List: Decreased activity tolerance;Decreased strength;Impaired balance (sitting and/or standing);Decreased knowledge of use of DME or AE;Cardiopulmonary status limiting activity      OT Treatment/Interventions: Self-care/ADL training;Therapeutic exercise;Therapeutic activities;Energy conservation;DME and/or AE instruction;Patient/family education;Balance training    OT Goals(Current goals can be found in the care plan section) Acute Rehab OT Goals Patient Stated Goal: breathe better and go home OT Goal Formulation: With patient/family Time For Goal Achievement: 10/14/21 Potential to Achieve Goals: Good ADL Goals Pt Will Transfer to Toilet: with modified independence;ambulating (LRAD PRN, elevated commode) Additional ADL Goal #1: Pt will perform seated bathing with set up and remote supervision for safety. Additional ADL Goal #2: Pt/family will verbalize plan to implement at least 2 learned ECS into daily ADL routines to minimize over exertion/SOB/falls.  OT Frequency: Min 2X/week   Barriers to D/C:            Co-evaluation              AM-PAC OT "6 Clicks" Daily Activity     Outcome Measure Help from another person eating meals?: None Help from another person taking care of personal grooming?: A  Little Help from another person toileting, which includes using toliet, bedpan, or urinal?: A Little Help from another person bathing (including washing, rinsing, drying)?: A Little Help from another person to put on and taking off regular upper body clothing?: None Help from another person to put on and taking off regular lower body clothing?: A Little 6 Click Score: 20   End of Session Equipment Utilized During Treatment: Oxygen;Rolling walker (2 wheels) Nurse Communication: Other (comment) (CNA - pt requesting assist for  bathing)  Activity Tolerance: Patient tolerated treatment well Patient left: in bed;with call bell/phone within reach;with family/visitor present (seated EOB per pt's request, Dtr present)  OT Visit Diagnosis: Other abnormalities of gait and mobility (R26.89);Muscle weakness (generalized) (M62.81)                Time: 4801-6553 OT Time Calculation (min): 18 min Charges:  OT General Charges $OT Visit: 1 Visit OT Evaluation $OT Eval Moderate Complexity: 1 Mod OT Treatments $Self Care/Home Management : 8-22 mins  Ardeth Perfect., MPH, MS, OTR/L ascom [phone] 09/30/21, 4:30 PM

## 2021-09-30 NOTE — Clinical Social Work Note (Signed)
°  Transition of Care [facility]) Screening Note   Patient Details  Name: KHYE HOCHSTETLER Date of Birth: [birth year]   Transition of Care [facility]) CM/SW Contact:    Eileen Stanford, LCSW Phone [place] 09/30/2021, 2:42 PM    Transition of Care Department [facility]) has reviewed patient and no TOC needs have been identified at this time. We will continue to monitor patient advancement through interdisciplinary progression rounds. If new patient transition needs arise, please place a TOC consult.

## 2021-09-30 NOTE — Progress Notes (Signed)
*  PRELIMINARY RESULTS* Echocardiogram 2D Echocardiogram has been performed.  Sherrie Sport 09/30/2021, 9:34 AM

## 2021-09-30 NOTE — Progress Notes (Signed)
NIF -25  VC 1.3 L

## 2021-09-30 NOTE — Evaluation (Signed)
Physical Therapy Evaluation Patient Details Name: [name] MRN: [id number] DOB: 10-Jan-1926 Today's Date: 09/30/2021  History of Present Illness  88 year old male with a history of HFrEF (EF 30%), mild aortic stenosis, CAD (CTO LAD, moderate to severe disease in OM), moderate pulmonary hypertension, hypertension, myasthenia gravis who was admitted to the hospital with shortness of breath. During hospital cource was noted to be less responsive and nonverbal with a code stroke initiated. Imaging unremarkable.   Clinical Impression  Patient is a pleasant 88 year old male who presents with generalized weakness and desaturation of oxygen with mobility. Prior to hospital admission, pt was independent and lives alone in a one story home.  Patient is in bed upon PT arrival and agreeable to therapy with daughter and son present. Patient is alert and oriented, able to follow all commands. Patient transitioned supine to sit without LOB with increased time. He is introduced to and ambulates with RW; session terminated early due to desaturation to 77% on 2 L of 02 via nasal cannula, oxygen raised to 3 L with SP02 raising to the 80s, returned to bed with oxygen returning to >98%.  Patient returned to supine position with needs met. Pt would benefit from skilled PT to address noted impairments and functional limitations (see below for any additional details).  Upon hospital discharge, pt would benefit from home health physical therapy and an aide.        Recommendations for follow up therapy are one component of a multi-disciplinary discharge planning process, led by the attending physician.  Recommendations may be updated based on patient status, additional functional criteria and insurance authorization.  Follow Up Recommendations Home health PT    Assistance Recommended at Discharge Frequent or constant Supervision/Assistance  Functional Status Assessment Patient has had a recent decline in their  functional status and demonstrates the ability to make significant improvements in function in a reasonable and predictable amount of time.  Equipment Recommendations  Rolling walker (2 wheels)    Recommendations for Other Services       Precautions / Restrictions Precautions Precautions: Fall Restrictions Weight Bearing Restrictions: No      Mobility  Bed Mobility Overal bed mobility: Modified Independent             General bed mobility comments: supine to sit with extra time    Transfers Overall transfer level: Modified independent Equipment used: Rolling walker (2 wheels)               General transfer comment: Cues for sequencing and CGA    Ambulation/Gait Ambulation/Gait assistance: Min guard Gait Distance (Feet): 80 Feet Assistive device: Rolling walker (2 wheels) Gait Pattern/deviations: Step-through pattern;Decreased stride length;Trunk flexed;Shuffle Gait velocity: decreased     General Gait Details: Patient ambulates with RW , slight adjustements required due to patient never using RW before, frequent stops due to desaturation.  Stairs            Wheelchair Mobility    Modified Rankin (Stroke Patients Only)       Balance Overall balance assessment: Needs assistance Sitting-balance support: Feet supported Sitting balance-Leahy Scale: Fair Sitting balance - Comments: good posture in seated position, able to move LE's without LOB   Standing balance support: Bilateral upper extremity supported;During functional activity Standing balance-Leahy Scale: Fair Standing balance comment: Patient utilized RW due to instability and shortness of breath.  Pertinent Vitals/Pain Pain Assessment: No/denies pain    Home Living Family/patient expects to be discharged to:: Private residence Living Arrangements: Alone (children live nearby and can stay with him if needed) Available Help at Discharge:  Family Type of Home: House Home Access: Stairs to enter Entrance Stairs-Rails: Psychiatric nurse of Steps: 4   Home Layout: One level Home Equipment: Cane - single point;Grab bars - tub/shower Additional Comments: Patient has lived in house since the 1950's. Has two children who can help and stay with him after discharge.    Prior Function Prior Level of Function : Independent/Modified Independent             Mobility Comments: Patient drives, walks with SPC, and is independent at baseline. ADLs Comments: per patient is independent with ADLs     Hand Dominance   Dominant Hand: Right    Extremity/Trunk Assessment   Upper Extremity Assessment Upper Extremity Assessment: Defer to OT evaluation    Lower Extremity Assessment Lower Extremity Assessment: RLE deficits/detail;LLE deficits/detail RLE Deficits / Details: grossly 4/5 RLE Sensation: WNL RLE Coordination: WNL LLE Deficits / Details: grossly 4-/5 LLE Sensation: WNL LLE Coordination: WNL       Communication   Communication: No difficulties  Cognition Arousal/Alertness: Awake/alert Behavior During Therapy: WFL for tasks assessed/performed Overall Cognitive Status: Within Functional Limits for tasks assessed                                 General Comments: Patient A and O x 4        General Comments General comments (skin integrity, edema, etc.): Patient appears well nourished and groomed.    Exercises Other Exercises Other Exercises: Patient and patient's children educated on role of PT in acute care setting, safe mobility and transfers, purse lipped breathing.   Assessment/Plan    PT Assessment Patient needs continued PT services  PT Problem List Decreased strength;Decreased activity tolerance;Decreased balance;Decreased knowledge of use of DME;Decreased mobility;Cardiopulmonary status limiting activity       PT Treatment Interventions DME instruction;Gait  training;Stair training;Functional mobility training;Therapeutic activities;Therapeutic exercise;Balance training;Neuromuscular re-education;Patient/family education    PT Goals (Current goals can be found in the Care Plan section)  Acute Rehab PT Goals Patient Stated Goal: to return home PT Goal Formulation: With patient Time For Goal Achievement: 10/14/21 Potential to Achieve Goals: Fair    Frequency Min 2X/week   Barriers to discharge   would benefit from HHPT and aide    Co-evaluation               AM-PAC PT "6 Clicks" Mobility  Outcome Measure Help needed turning from your back to your side while in a flat bed without using bedrails?: None Help needed moving from lying on your back to sitting on the side of a flat bed without using bedrails?: A Little Help needed moving to and from a bed to a chair (including a wheelchair)?: A Little Help needed standing up from a chair using your arms (e.g., wheelchair or bedside chair)?: A Little Help needed to walk in hospital room?: A Little Help needed climbing 3-5 steps with a railing? : A Little 6 Click Score: 19    End of Session Equipment Utilized During Treatment: Gait belt;Oxygen (2 L of 02 brought to 3 L via nasal cannula for ambulation) Activity Tolerance: Patient tolerated treatment well;Other (comment) (Sp02 dropping to 77% with ambulation) Patient left: in bed;with call bell/phone  within reach;with bed alarm set;with family/visitor present Nurse Communication: Mobility status;Other (comment) (Sp02 drop to 77% with ambulation) PT Visit Diagnosis: Unsteadiness on feet (R26.81);Other abnormalities of gait and mobility (R26.89);Muscle weakness (generalized) (M62.81);Difficulty in walking, not elsewhere classified (R26.2)    Time: 1410-1435 PT Time Calculation (min) (ACUTE ONLY): 25 min   Charges:   PT Evaluation $PT Eval Low Complexity: 1 Low PT Treatments $Therapeutic Activity: 8-22 mins        Janna Arch, PT,  DPT  09/30/2021, 3:56 PM

## 2021-10-01 DIAGNOSIS — G7001 Myasthenia gravis with (acute) exacerbation: Secondary | ICD-10-CM

## 2021-10-01 LAB — CBC
HCT: 39.1 % (ref 39.0–52.0)
Hemoglobin: 12 g/dL — ABNORMAL LOW (ref 13.0–17.0)
MCH: 29.3 pg (ref 26.0–34.0)
MCHC: 30.7 g/dL (ref 30.0–36.0)
MCV: 95.4 fL (ref 80.0–100.0)
Platelets: 179 10*3/uL (ref 150–400)
RBC: 4.1 MIL/uL — ABNORMAL LOW (ref 4.22–5.81)
RDW: 13.8 % (ref 11.5–15.5)
WBC: 6.9 10*3/uL (ref 4.0–10.5)
nRBC: 0 % (ref 0.0–0.2)

## 2021-10-01 LAB — BASIC METABOLIC PANEL
Anion gap: 6 (ref 5–15)
BUN: 21 mg/dL (ref 8–23)
CO2: 36 mmol/L — ABNORMAL HIGH (ref 22–32)
Calcium: 8.4 mg/dL — ABNORMAL LOW (ref 8.9–10.3)
Chloride: 94 mmol/L — ABNORMAL LOW (ref 98–111)
Creatinine, Ser: 0.89 mg/dL (ref 0.61–1.24)
GFR, Estimated: >60 mL/min (ref >60)
Glucose, Bld: 107 mg/dL — ABNORMAL HIGH (ref 70–99)
Potassium: 3.8 mmol/L (ref 3.5–5.1)
Sodium: 136 mmol/L (ref 135–145)

## 2021-10-01 LAB — BRAIN NATRIURETIC PEPTIDE: B Natriuretic Peptide: 580.4 pg/mL — ABNORMAL HIGH (ref 0.0–100.0)

## 2021-10-01 MED ORDER — FUROSEMIDE 10 MG/ML IJ SOLN
40.0000 mg | Freq: Two times a day (BID) | INTRAMUSCULAR | Status: AC
  Administered 2021-10-01 – 2021-10-02 (×2): 40 mg via INTRAVENOUS
  Filled 2021-10-01 (×2): qty 4

## 2021-10-01 MED ORDER — FUROSEMIDE 10 MG/ML IJ SOLN
40.0000 mg | Freq: Once | INTRAMUSCULAR | Status: AC
  Administered 2021-10-01: 40 mg via INTRAVENOUS
  Filled 2021-10-01: qty 4

## 2021-10-01 MED ORDER — IMMUNE GLOBULIN (HUMAN) 10 GM/100ML IV SOLN
400.0000 mg/kg | INTRAVENOUS | Status: DC
  Administered 2021-10-01 – 2021-10-02 (×2): 35 g via INTRAVENOUS
  Filled 2021-10-01 (×2): qty 350

## 2021-10-01 NOTE — Progress Notes (Signed)
Subjective: He feels similar to yesterday. States that he was SOB at bedtime yesterday and could not sleep, but then when bipap was placed he went to sleep right away.   Exam: Vitals:   10/01/21 0209 10/01/21 0719  BP:  121/69  Pulse: 67 69  Resp:  20  Temp:  98.2 F (36.8 C)  SpO2: 99% 95%   Gen: In bed, NAD Resp: non-labored breathing, no acute distress Abd: soft, nt  Neuro: MS: Awake, alert, interactive and appropriate CN: He has conjugate gaze, pupils are reactive bilaterally.  He does not have disc conjugation with sustained upgaze, nor does he have ptosis.  His palate elevates symmetrically and well.  His smile is normal and not transverse. Motor: he has good strength bilaterally without significant fatigueability.  Sensory:intact to LT  Pertinent Labs: ACHr + from 2014 P/Q type calcium channel antibodies form 2014 - positive   Impression: 88 yo M with myasthenia gravis(MG) as evidenced by positive Ach-R antibodies in 2014 and consistent symptoms. 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  He has an acute CHF exacerbation. Whether MG is playing a role or not may be difficult to tease out. The fact that he has been hypercarbic and the fact that he gets so much relief with bi-pap makes me wonder if MG is playing a role. I am hesitant to use steroids alone as these can worsen an exacerbation and his NIF/FVC are tenuous. At this point, I think I would favor starting IVIG even though this does add a significant volume to his daily intake. I have discussed this with   Recommendations: 1) continue treatment of underlying CHF exacerbation 2) continue home pyridostigmine and mycophenolate 3)  starting IVIG today.   Roland Rack, MD [facility] [phone]  If 7pm- 7am, please page neurology on call as listed in [place].

## 2021-10-01 NOTE — Progress Notes (Signed)
NIF -20 FVC 1.12 L

## 2021-10-01 NOTE — Progress Notes (Signed)
NIF -20 cm H2O VC 1.13 L

## 2021-10-01 NOTE — Progress Notes (Addendum)
[place] NOTE  Patient ID: [name] MRN: [id number] DOB/AGE: 1925-12-31 88 y.o.  Admit date: 09/28/2021 Referring Physician Eppie Gibson Primary Physician [name], MD Primary Cardiologist Serafina Royals  Reason for Consultation AoCHF  HPI:  [name] is a 88 year old male with a history of HFrEF (EF 30%), mild aortic stenosis, CAD (CTO LAD, moderate to severe disease in OM), moderate pulmonary hypertension, hypertension, myasthenia gravis who was admitted to the hospital with shortness of breath.  Cardiology is consulted for management of acute on chronic congestive heart failure.  Interval history - On 3L  oxygen this morning, on BiPAP overnight -Patient ambulated around the floor with PT and OT yesterday. - No output recorded overnight in chart. -Patient states he feels about the same compared to yesterday.  Review of systems complete and found to be negative unless listed above     Past Medical History:  Diagnosis Date   Abnormal chest CT 03/28/2017   Allergy    Anemia    Angina pectoris ([place]) 04/02/2011   Aortic aneurysm ([place]) 03/28/2017   Saw Dr Genevive Bi 04/16/17 - recommended f/u CT in 3 months.     Atherosclerosis of both carotid arteries 07/17/2014   Benign lipomatous neoplasm of skin and subcutaneous tissue of left arm 10/04/2013   Bilateral carotid artery stenosis 08/30/2014   Overview:  Less than 50% 2015   Bladder cancer ([place]) 03/05/2013   CAD (coronary artery disease) 06/18/2014   Cancer ([place]) 11-24-12   bladder. BCG treatments by Dr Jacqlyn Larsen.   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 WD SCC. Re-shaved 01/14/2016. Excised 02/05/2016, margins free.   Squamous cell carcinoma of skin 11/03/2018   Right cheek ant. to sideburn. Poorly differentiated.   Squamous cell carcinoma of skin 02/02/2019   Right mid dorsum forearm. WD SCC. EDC.    Past Surgical History:  Procedure Laterality Date   [place]    Medications Prior to Admission  Medication Sig Dispense Refill Last Dose   aspirin 81 MG tablet Take 81 mg by mouth daily.   09/28/2021   finasteride (PROSCAR) 5 MG tablet Take 5 mg by mouth daily.   09/28/2021   hydroxypropyl methylcellulose / hypromellose (ISOPTO TEARS / GONIOVISC) 2.5 % ophthalmic solution Place 1 drop into both eyes daily.   09/28/2021   mycophenolate (CELLCEPT) 250 MG capsule Take 1,000 mg by mouth 2 (two) times daily.    09/28/2021   pravastatin (PRAVACHOL) 20 MG tablet Take 1 tablet (20 mg total) by mouth daily. 90 tablet 1 09/28/2021   prednisoLONE acetate (PRED FORTE) 1 % ophthalmic suspension Place 1 drop  into the right eye daily. Per patient. 5 mL 0 09/28/2021   pyridostigmine (MESTINON) 60 MG tablet Take 30 mg by mouth 6 (six) times daily.    09/28/2021   tamsulosin (FLOMAX) 0.4 MG CAPS Take 0.4 mg by mouth daily.   09/28/2021   furosemide (LASIX) 40 MG tablet Take 1 tablet (40 mg total) by mouth daily. 90 tablet 3    tacrolimus (PROTOPIC) 0.1 % ointment Apply topically 2 (two) times daily. 100 g 2     Social History   Socioeconomic History   Marital status: Married     Spouse name: Not on file   Number of children: Not on file   Years of education: Not on file   Highest education level: Not on file  Occupational History   Not on file  Tobacco Use   Smoking status: Former    Types: Cigarettes    Quit date: 09/29/1958    Years since quitting: 63.0   Smokeless tobacco: Never  Vaping Use   Vaping Use: Never used  Substance and Sexual Activity   Alcohol use: Yes    Alcohol/week: 0.0 standard drinks    Comment: occasionally   Drug use: No   Sexual activity: Never  Other Topics Concern   Not on file  Social History Narrative   Pt is married with 2 children - 1 son, 1 daughter. Previously self-employed in [place] Strain: Low Risk    Difficulty of Paying Living Expenses: Not hard at all  Food Insecurity: No Food Insecurity   Worried About Charity fundraiser in the Last Year: Never true   Arboriculturist in the Last Year: Never true  Transportation Needs: No Transportation Needs   Lack of Transportation (Medical): No   Lack of Transportation (Non-Medical): No  Physical Activity: Not on file  Stress: No Stress Concern Present   Feeling of Stress : Not at all  Social Connections: Unknown   Frequency of Communication with Friends and Family: More than three times a week   Frequency of Social Gatherings with Friends and Family: More than three times a week   Attends Religious Services: Not on file   Active Member of Clubs or Organizations: Not on file   Attends Archivist Meetings: Not on file   Marital Status: Widowed  Human resources officer Violence: Not At Risk   Fear of Current or Ex-Partner: No   Emotionally Abused: No   Physically Abused: No   Sexually Abused: No    Family History  Problem Relation Age of Onset   Lung cancer Sister    Prostate cancer Brother    Lung cancer Brother    Arthritis Other        parent   Colon cancer Neg Hx       Review of systems  complete and found to be negative unless listed above      PHYSICAL EXAM  General: Elderly appearing Caucasian male examined sitting upright in PCU bed with daughter at bedside. HEENT:  Normocephalic and atramatic Neck:  JVD elevated at 30 degrees Lungs: Course breath sounds throughout.  On O2 by nasal cannula Heart: HRRR, frequent extra beats. 2/6 systolic murmur  Abdomen: Nondistended appearing Msk: Normal strength and tone for age. Extremities: 2+ LE edema, with associated chronic skin changes Neuro: No focal deficit Psych:  Responds appropriately  Labs:   Lab Results  Component Value Date  WBC 6.8 09/30/2021   HGB 11.9 (L) 09/30/2021   HCT 40.4 09/30/2021   MCV 98.3 09/30/2021   PLT 174 09/30/2021    Recent Labs  Lab 09/29/21 0500 09/30/21 0555  NA 137 138  K 4.5 4.2  CL 99 96*  CO2 29 37*  BUN 21 22  CREATININE 0.99 1.13  CALCIUM 8.8* 8.3*  PROT 6.8  --   BILITOT 1.1  --   ALKPHOS 73  --   ALT 21  --   AST 23  --   GLUCOSE 114* 111*    Lab Results  Component Value Date   TROPONINI 0.43 (HH) 12/29/2016     Lab Results  Component Value Date   CHOL 140 02/22/2021   CHOL 159 11/15/2020   CHOL 155 07/16/2020   Lab Results  Component Value Date   HDL 44.70 02/22/2021   HDL 57.20 11/15/2020   HDL 53.80 07/16/2020   Lab Results  Component Value Date   LDLCALC 71 02/22/2021   LDLCALC 87 11/15/2020   LDLCALC 86 07/16/2020   Lab Results  Component Value Date   TRIG 121.0 02/22/2021   TRIG 74.0 11/15/2020   TRIG 77.0 07/16/2020   Lab Results  Component Value Date   CHOLHDL 3 02/22/2021   CHOLHDL 3 11/15/2020   CHOLHDL 3 07/16/2020   Lab Results  Component Value Date   LDLDIRECT 143.5 08/26/2013      Radiology: DG Chest 1 View  Result Date: 09/29/2021 CLINICAL DATA:  Shortness of breath. Patient has a history of congestive heart failure. EXAM: CHEST  1 VIEW COMPARISON:  September 28, 2021 5:44 p.m. FINDINGS: The heart size is enlarged.  Mediastinal contour is stable. There is increased pulmonary interstitium bilaterally worsened compared prior exam. Probable small left pleural effusion is identified. Osseous structures are stable. IMPRESSION: Congestive heart failure worsened compared prior exam. Electronically Signed   By: Abelardo Diesel M.D.   On: 09/29/2021 09:27   DG Chest 2 View  Result Date: 09/28/2021 CLINICAL DATA:  Short of breath for the past 3 days. EXAM: CHEST - 2 VIEW COMPARISON:  01/13/2020. FINDINGS: Cardiac silhouette mildly enlarged.  No mediastinal or hilar masses. Bilateral interstitial thickening. Additional opacities noted in the left lung base, likely atelectasis. No pleural effusion.  No pneumothorax. Skeletal structures are demineralized, but grossly intact. IMPRESSION: 1. Cardiomegaly with interstitial thickening. Suspect mild congestive heart failure. Electronically Signed   By: Lajean Manes M.D.   On: 09/28/2021 18:22   ECHOCARDIOGRAM COMPLETE  Result Date: 09/30/2021    ECHOCARDIOGRAM REPORT   Patient Name:   [name] Date of Exam: 09/30/2021 Medical Rec #:  [id number]       Height:       70.0 in Accession #:    [id number]      Weight:       199.3 lb Date of Birth:  [birth year]       BSA:          2.084 m Patient Age:    42 years        BP:           124/64 mmHg Patient Gender: M               HR:           66 bpm. Exam Location:  [facility] Procedure: 2D Echo, Cardiac Doppler and Color Doppler Indications:     CHF-acute systolic Z60.10  History:  Patient has prior history of Echocardiogram examinations, most                  recent 01/15/2020. CAD; Risk Factors:Hypertension.  Sonographer:     Sherrie Sport Referring Phys:  [id number] Jeff Diagnosing Phys: Donnelly Angelica  Sonographer Comments: Suboptimal apical window. IMPRESSIONS  1. Left ventricular ejection fraction, by estimation, is 30 to 35%. The left ventricle has moderately decreased function. The left ventricle demonstrates regional wall motion  abnormalities (see scoring diagram/findings for description). Left ventricular  diastolic parameters are consistent with Grade II diastolic dysfunction (pseudonormalization).  2. Right ventricular systolic function is normal. The right ventricular size is not well visualized.  3. Left atrial size was severely dilated.  4. The mitral valve is normal in structure. Mild mitral valve regurgitation. No evidence of mitral stenosis.  5. The aortic valve is calcified. Aortic valve regurgitation is not visualized. Aortic valve sclerosis/calcification is present, without any evidence of aortic stenosis. Comparison(s): No significant change from prior study. FINDINGS  Left Ventricle: Left ventricular ejection fraction, by estimation, is 30 to 35%. The left ventricle has moderately decreased function. The left ventricle demonstrates regional wall motion abnormalities. Severe hypokinesis of the left ventricular, entire  apical segment and anteroseptal wall. The left ventricular internal cavity size was normal in size. There is no left ventricular hypertrophy. Left ventricular diastolic parameters are consistent with Grade II diastolic dysfunction (pseudonormalization). Right Ventricle: The right ventricular size is not well visualized. Right vetricular wall thickness was not well visualized. Right ventricular systolic function is normal. Left Atrium: Left atrial size was severely dilated. Right Atrium: Right atrial size was normal in size. Pericardium: There is no evidence of pericardial effusion. Mitral Valve: The mitral valve is normal in structure. Mild mitral valve regurgitation. No evidence of mitral valve stenosis. MV peak gradient, 6.1 mmHg. The mean mitral valve gradient is 2.0 mmHg. Tricuspid Valve: The tricuspid valve is not well visualized. Tricuspid valve regurgitation is trivial. Aortic Valve: The aortic valve is calcified. Aortic valve regurgitation is not visualized. Aortic valve sclerosis/calcification is  present, without any evidence of aortic stenosis. Aortic valve mean gradient measures 3.0 mmHg. Aortic valve peak gradient measures 5.3 mmHg. Aortic valve area, by VTI measures 1.69 cm. Pulmonic Valve: The pulmonic valve was not well visualized. Pulmonic valve regurgitation is mild. No evidence of pulmonic stenosis. Aorta: The aortic root and ascending aorta are structurally normal, with no evidence of dilitation. IAS/Shunts: The interatrial septum was not well visualized.  LEFT VENTRICLE PLAX 2D LVIDd:         5.00 cm      Diastology LVIDs:         4.10 cm      LV e' medial:    4.03 cm/s LV PW:         1.30 cm      LV E/e' medial:  21.3 LV IVS:        1.50 cm      LV e' lateral:   8.59 cm/s LVOT diam:     2.00 cm      LV E/e' lateral: 10.0 LV SV:         36 LV SV Index:   17 LVOT Area:     3.14 cm  LV Volumes (MOD) LV vol d, MOD A2C: 178.0 ml LV vol d, MOD A4C: 207.0 ml LV vol s, MOD A2C: 165.0 ml LV vol s, MOD A4C: 96.2 ml LV SV MOD A2C:  13.0 ml LV SV MOD A4C:     207.0 ml LV SV MOD BP:      57.3 ml RIGHT VENTRICLE RV S prime:     18.00 cm/s TAPSE (M-mode): 4.8 cm LEFT ATRIUM              Index        RIGHT ATRIUM           Index LA diam:        5.30 cm  2.54 cm/m   RA Area:     17.30 cm LA Vol (A2C):   101.0 ml 48.46 ml/m  RA Volume:   48.50 ml  23.27 ml/m LA Vol (A4C):   127.0 ml 60.93 ml/m LA Biplane Vol: 118.0 ml 56.62 ml/m  AORTIC VALVE                    PULMONIC VALVE AV Area (Vmax):    1.30 cm     PV Vmax:        0.78 m/s AV Area (Vmean):   1.29 cm     PV Vmean:       53.600 cm/s AV Area (VTI):     1.69 cm     PV VTI:         0.152 m AV Vmax:           114.67 cm/s  PV Peak grad:   2.4 mmHg AV Vmean:          76.567 cm/s  PV Mean grad:   1.5 mmHg AV VTI:            0.214 m      RVOT Peak grad: 4 mmHg AV Peak Grad:      5.3 mmHg AV Mean Grad:      3.0 mmHg LVOT Vmax:         47.60 cm/s LVOT Vmean:        31.500 cm/s LVOT VTI:          0.115 m LVOT/AV VTI ratio: 0.54  AORTA Ao Root diam: 2.93  cm MITRAL VALVE               TRICUSPID VALVE MV Area (PHT): 4.80 cm    TR Peak grad:   27.2 mmHg MV Area VTI:   1.02 cm    TR Vmax:        261.00 cm/s MV Peak grad:  6.1 mmHg MV Mean grad:  2.0 mmHg    SHUNTS MV Vmax:       1.23 m/s    Systemic VTI:  0.12 m MV Vmean:      61.9 cm/s   Systemic Diam: 2.00 cm MV Decel Time: 158 msec    Pulmonic VTI:  0.217 m MV E velocity: 85.70 cm/s MV A velocity: 99.40 cm/s MV E/A ratio:  0.86 Donnelly Angelica Electronically signed by Donnelly Angelica Signature Date/Time: 09/30/2021/12:59:07 PM    Final    CT HEAD CODE STROKE WO CONTRAST  Result Date: 09/29/2021 CLINICAL DATA:  Code stroke. EXAM: CT HEAD WITHOUT CONTRAST TECHNIQUE: Contiguous axial images were obtained from the base of the skull through the vertex without intravenous contrast. COMPARISON:  None. FINDINGS: Brain: No evidence of acute infarction, hemorrhage, hydrocephalus, extra-axial collection or mass lesion/mass effect. Vascular: No hyperdense vessel or unexpected calcification. Skull: Normal. Negative for fracture or focal lesion. Sinuses/Orbits: No acute finding Other: These results were communicated to Dr. Rory Percy at 8:30 am on 09/29/2021 by  text page via the [facility] messaging system. ASPECTS [facility] Stroke Program Early CT Score) Not scored without localizing symptom IMPRESSION: Motion degraded head CT without acute finding. Electronically Signed   By: Jorje Guild M.D.   On: 09/29/2021 08:31   CT ANGIO HEAD NECK W WO CM W PERF (CODE STROKE)  Result Date: 09/29/2021 CLINICAL DATA:  Acute stroke suspected EXAM: CT ANGIOGRAPHY HEAD AND NECK TECHNIQUE: Multidetector CT imaging of the head and neck was performed using the standard protocol during bolus administration of intravenous contrast. Multiplanar CT image reconstructions and MIPs were obtained to evaluate the vascular anatomy. Carotid stenosis measurements (when applicable) are obtained utilizing NASCET criteria, using the distal internal carotid diameter as the  denominator. CONTRAST:  Dose is not known on this in progress study COMPARISON:  Noncontrast head CT earlier the same day FINDINGS: CTA NECK FINDINGS Aortic arch: Elongated and atheromatous aorta. Right carotid system: Diffuse atheromatous plaque. Mixed density, mainly calcified plaque at the bifurcation and bulb with proximal ICA stenosis measuring up to 50%. Left carotid system: Atheromatous wall thickening. Calcified plaque at the proximal ICA without associated stenosis. Subsequently is a vessel kink which narrows the lumen to approximately 55%. No ulceration or dissection. Vertebral arteries: Proximal subclavian atherosclerosis and tortuosity without flow limiting stenosis. The right vertebral artery is dominant. Calcified plaque at the right vertebral origin causing 50% stenosis based on sagittal reformats. No dissection or beading. Skeleton: No acute or aggressive finding. Other neck: Negative Upper chest: Small pleural effusions. Airway cuffing and interlobular septal thickening. Review of the MIP images confirms the above findings CTA HEAD FINDINGS Anterior circulation: Extensive atheromatous plaque along the carotid siphons. No significant stenosis, proximal occlusion, aneurysm, or vascular malformation. Posterior circulation: No significant stenosis, proximal occlusion, aneurysm, or vascular malformation. Venous sinuses: Diffusely patent Anatomic variants: None significant Review of the MIP images confirms the above findings IMPRESSION: 1. No emergent finding. 2. Atherosclerosis with ~50% narrowing at the bilateral proximal ICA and right vertebral origin. 3. Probable CHF. Electronically Signed   By: Jorje Guild M.D.   On: 09/29/2021 08:56    EKG: Sinus rhythm with frequent PVCs.  Poor R wave progression.  Lateral T wave abnormality.  ASSESSMENT AND PLAN:  [name] is a 88 year old male with a history of HFrEF (EF 30%), mild aortic stenosis, CAD (CTO LAD, moderate to severe disease in OM),  moderate pulmonary hypertension, hypertension, myasthenia gravis who was admitted to the hospital with shortness of breath.  Cardiology is consulted for management of acute on chronic congestive heart failure. Has improved with diuresis but may also have exacerbation of MG playing a role.   # Heart failure with reduced EF (LVEF 30%) #Acute hypoxic and hypercapnic respiratory failure # Myasthenia gravis EF previously estimated at 30%, with moderate pulmonary hypertension, and mild aortic stenosis.  He has been intermittently compliant recently with his Lasix, and in addition has been enjoying holiday meals with significant increase in salt intake as a result. He has improved some with diuresis.  - Appreciate neurology input. After further discussion with Dr. Leonel Ramsay and Dr. Corky Sox, plan to initiate IVIG. We will monitor his volume status and increase lasix dosing as below to account for IVIG.  -Lasix 40 mg IV given once 09/30/21. Reordered 40mg  IV BID for today and will reassess tomorrow.  -Beta-blocker contraindicated in myasthenia gravis -BP is still soft at low 161W systolic - but will attempt to initiate ACE/ARB later in his hospital course for afterload reduction and GDMT. -Wean  BiPAP as tolerated, encourage ambulation with PT/OT and monitoring of I&Os (none recorded on 09/30/21)  #CAD Underwent heart catheterization in 2015 which showed a CTO of his mid LAD, and severe disease in his OM.  This appears to have been medically managed.  Continues to deny chest pain. -EKG shows poor R wave progression consistent with prior septal infarct. -Continue aspirin 81 mg indefinitely -Continue pravastatin 20 mg daily.  Given his age and comorbidities, we will not change this to a high intensity statin therapy at this time.  This patient's case was discussed with Dr. Donnelly Angelica and he is in agreement with this plan of care as above.   Signed: Tristan Schroeder, PA-C 10/01/2021, 9:14 AM

## 2021-10-01 NOTE — Care Management Important Message (Signed)
Important Message  Patient Details  Name: [name] MRN: [id number] Date of Birth: 10-26-1925   Medicare Important Message Given:  Yes     Dannette Barbara 10/01/2021, 1:36 PM

## 2021-10-01 NOTE — Progress Notes (Deleted)
[place] NOTE  Patient ID: [name] MRN: [id number] DOB/AGE: [birth year] 88 y.o.  Admit date: 09/28/2021 Referring Physician Eppie Gibson Primary Physician [name], MD Primary Cardiologist Serafina Royals  Reason for Consultation AoCHF  HPI:  [name] is a 88 year old male with a history of HFrEF (EF 30%), mild aortic stenosis, CAD (CTO LAD, moderate to severe disease in OM), moderate pulmonary hypertension, hypertension, myasthenia gravis who was admitted to the hospital with shortness of breath.  Cardiology is consulted for management of acute on chronic congestive heart failure.  Interval history - On 3L  oxygen this morning, on BiPAP overnight -Patient ambulated around the floor with PT and OT yesterday. - No output recorded overnight in chart. -Patient states he feels about the same compared to yesterday.  Review of systems complete and found to be negative unless listed above     Past Medical History:  Diagnosis Date   Abnormal chest CT 03/28/2017   Allergy    Anemia    Angina pectoris ([place]) 04/02/2011   Aortic aneurysm ([place]) 03/28/2017   Saw Dr Genevive Bi 04/16/17 - recommended f/u CT in 3 months.     Atherosclerosis of both carotid arteries 07/17/2014   Benign lipomatous neoplasm of skin and subcutaneous tissue of left arm 10/04/2013   Bilateral carotid artery stenosis 08/30/2014   Overview:  Less than 50% 2015   Bladder cancer ([place]) 03/05/2013   CAD (coronary artery disease) 06/18/2014   Cancer ([place]) 11-24-12   bladder. BCG treatments by Dr Jacqlyn Larsen.   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 WD SCC. Re-shaved 01/14/2016. Excised 02/05/2016, margins free.   Squamous cell carcinoma of skin 11/03/2018   Right cheek ant. to sideburn. Poorly differentiated.   Squamous cell carcinoma of skin 02/02/2019   Right mid dorsum forearm. WD SCC. EDC.    Past Surgical History:  Procedure Laterality Date   [place]    Medications Prior to Admission  Medication Sig Dispense Refill Last Dose   aspirin 81 MG tablet Take 81 mg by mouth daily.   09/28/2021   finasteride (PROSCAR) 5 MG tablet Take 5 mg by mouth daily.   09/28/2021   hydroxypropyl methylcellulose / hypromellose (ISOPTO TEARS / GONIOVISC) 2.5 % ophthalmic solution Place 1 drop into both eyes daily.   09/28/2021   mycophenolate (CELLCEPT) 250 MG capsule Take 1,000 mg by mouth 2 (two) times daily.    09/28/2021   pravastatin (PRAVACHOL) 20 MG tablet Take 1 tablet (20 mg total) by mouth daily. 90 tablet 1 09/28/2021   prednisoLONE acetate (PRED FORTE) 1 % ophthalmic suspension Place 1 drop  into the right eye daily. Per patient. 5 mL 0 09/28/2021   pyridostigmine (MESTINON) 60 MG tablet Take 30 mg by mouth 6 (six) times daily.    09/28/2021   tamsulosin (FLOMAX) 0.4 MG CAPS Take 0.4 mg by mouth daily.   09/28/2021   furosemide (LASIX) 40 MG tablet Take 1 tablet (40 mg total) by mouth daily. 90 tablet 3    tacrolimus (PROTOPIC) 0.1 % ointment Apply topically 2 (two) times daily. 100 g 2     Social History   Socioeconomic History   Marital status: Married     Spouse name: Not on file   Number of children: Not on file   Years of education: Not on file   Highest education level: Not on file  Occupational History   Not on file  Tobacco Use   Smoking status: Former    Types: Cigarettes    Quit date: 09/29/1958    Years since quitting: 63.0   Smokeless tobacco: Never  Vaping Use   Vaping Use: Never used  Substance and Sexual Activity   Alcohol use: Yes    Alcohol/week: 0.0 standard drinks    Comment: occasionally   Drug use: No   Sexual activity: Never  Other Topics Concern   Not on file  Social History Narrative   Pt is married with 2 children - 1 son, 1 daughter. Previously self-employed in [place] Strain: Low Risk    Difficulty of Paying Living Expenses: Not hard at all  Food Insecurity: No Food Insecurity   Worried About Charity fundraiser in the Last Year: Never true   Arboriculturist in the Last Year: Never true  Transportation Needs: No Transportation Needs   Lack of Transportation (Medical): No   Lack of Transportation (Non-Medical): No  Physical Activity: Not on file  Stress: No Stress Concern Present   Feeling of Stress : Not at all  Social Connections: Unknown   Frequency of Communication with Friends and Family: More than three times a week   Frequency of Social Gatherings with Friends and Family: More than three times a week   Attends Religious Services: Not on file   Active Member of Clubs or Organizations: Not on file   Attends Archivist Meetings: Not on file   Marital Status: Widowed  Human resources officer Violence: Not At Risk   Fear of Current or Ex-Partner: No   Emotionally Abused: No   Physically Abused: No   Sexually Abused: No    Family History  Problem Relation Age of Onset   Lung cancer Sister    Prostate cancer Brother    Lung cancer Brother    Arthritis Other        parent   Colon cancer Neg Hx       Review of systems  complete and found to be negative unless listed above      PHYSICAL EXAM  General: Elderly appearing Caucasian male examined sitting upright in PCU bed with daughter at bedside. HEENT:  Normocephalic and atramatic Neck:  JVD elevated at 30 degrees Lungs: Course breath sounds throughout.  On O2 by nasal cannula Heart: HRRR, frequent extra beats. 2/6 systolic murmur  Abdomen: Nondistended appearing Msk: Normal strength and tone for age. Extremities: 2+ LE edema, with associated chronic skin changes Neuro: No focal deficit Psych:  Responds appropriately  Labs:   Lab Results  Component Value Date  WBC 6.9 10/01/2021   HGB 12.0 (L) 10/01/2021   HCT 39.1 10/01/2021   MCV 95.4 10/01/2021   PLT 179 10/01/2021    Recent Labs  Lab 09/29/21 0500 09/30/21 0555  NA 137 138  K 4.5 4.2  CL 99 96*  CO2 29 37*  BUN 21 22  CREATININE 0.99 1.13  CALCIUM 8.8* 8.3*  PROT 6.8  --   BILITOT 1.1  --   ALKPHOS 73  --   ALT 21  --   AST 23  --   GLUCOSE 114* 111*    Lab Results  Component Value Date   TROPONINI 0.43 (HH) 12/29/2016     Lab Results  Component Value Date   CHOL 140 02/22/2021   CHOL 159 11/15/2020   CHOL 155 07/16/2020   Lab Results  Component Value Date   HDL 44.70 02/22/2021   HDL 57.20 11/15/2020   HDL 53.80 07/16/2020   Lab Results  Component Value Date   LDLCALC 71 02/22/2021   LDLCALC 87 11/15/2020   LDLCALC 86 07/16/2020   Lab Results  Component Value Date   TRIG 121.0 02/22/2021   TRIG 74.0 11/15/2020   TRIG 77.0 07/16/2020   Lab Results  Component Value Date   CHOLHDL 3 02/22/2021   CHOLHDL 3 11/15/2020   CHOLHDL 3 07/16/2020   Lab Results  Component Value Date   LDLDIRECT 143.5 08/26/2013      Radiology: DG Chest 1 View  Result Date: 09/29/2021 CLINICAL DATA:  Shortness of breath. Patient has a history of congestive heart failure. EXAM: CHEST  1 VIEW COMPARISON:  September 28, 2021 5:44 p.m. FINDINGS: The heart size is enlarged.  Mediastinal contour is stable. There is increased pulmonary interstitium bilaterally worsened compared prior exam. Probable small left pleural effusion is identified. Osseous structures are stable. IMPRESSION: Congestive heart failure worsened compared prior exam. Electronically Signed   By: Abelardo Diesel M.D.   On: 09/29/2021 09:27   DG Chest 2 View  Result Date: 09/28/2021 CLINICAL DATA:  Short of breath for the past 3 days. EXAM: CHEST - 2 VIEW COMPARISON:  01/13/2020. FINDINGS: Cardiac silhouette mildly enlarged.  No mediastinal or hilar masses. Bilateral interstitial thickening. Additional opacities noted in the left lung base, likely atelectasis. No pleural effusion.  No pneumothorax. Skeletal structures are demineralized, but grossly intact. IMPRESSION: 1. Cardiomegaly with interstitial thickening. Suspect mild congestive heart failure. Electronically Signed   By: Lajean Manes M.D.   On: 09/28/2021 18:22   ECHOCARDIOGRAM COMPLETE  Result Date: 09/30/2021    ECHOCARDIOGRAM REPORT   Patient Name:   DEKOTA SHENK Date of Exam: 09/30/2021 Medical Rec #:  [id number]       Height:       70.0 in Accession #:    [id number]      Weight:       199.3 lb Date of Birth:  11-02-1925       BSA:          2.084 m Patient Age:    55 years        BP:           124/64 mmHg Patient Gender: M               HR:           66 bpm. Exam Location:  [facility] Procedure: 2D Echo, Cardiac Doppler and Color Doppler Indications:     CHF-acute systolic B51.02  History:  Patient has prior history of Echocardiogram examinations, most                  recent 01/15/2020. CAD; Risk Factors:Hypertension.  Sonographer:     Sherrie Sport Referring Phys:  [id number] [place] Diagnosing Phys: Donnelly Angelica  Sonographer Comments: Suboptimal apical window. IMPRESSIONS  1. Left ventricular ejection fraction, by estimation, is 30 to 35%. The left ventricle has moderately decreased function. The left ventricle demonstrates regional wall motion  abnormalities (see scoring diagram/findings for description). Left ventricular  diastolic parameters are consistent with Grade II diastolic dysfunction (pseudonormalization).  2. Right ventricular systolic function is normal. The right ventricular size is not well visualized.  3. Left atrial size was severely dilated.  4. The mitral valve is normal in structure. Mild mitral valve regurgitation. No evidence of mitral stenosis.  5. The aortic valve is calcified. Aortic valve regurgitation is not visualized. Aortic valve sclerosis/calcification is present, without any evidence of aortic stenosis. Comparison(s): No significant change from prior study. FINDINGS  Left Ventricle: Left ventricular ejection fraction, by estimation, is 30 to 35%. The left ventricle has moderately decreased function. The left ventricle demonstrates regional wall motion abnormalities. Severe hypokinesis of the left ventricular, entire  apical segment and anteroseptal wall. The left ventricular internal cavity size was normal in size. There is no left ventricular hypertrophy. Left ventricular diastolic parameters are consistent with Grade II diastolic dysfunction (pseudonormalization). Right Ventricle: The right ventricular size is not well visualized. Right vetricular wall thickness was not well visualized. Right ventricular systolic function is normal. Left Atrium: Left atrial size was severely dilated. Right Atrium: Right atrial size was normal in size. Pericardium: There is no evidence of pericardial effusion. Mitral Valve: The mitral valve is normal in structure. Mild mitral valve regurgitation. No evidence of mitral valve stenosis. MV peak gradient, 6.1 mmHg. The mean mitral valve gradient is 2.0 mmHg. Tricuspid Valve: The tricuspid valve is not well visualized. Tricuspid valve regurgitation is trivial. Aortic Valve: The aortic valve is calcified. Aortic valve regurgitation is not visualized. Aortic valve sclerosis/calcification is  present, without any evidence of aortic stenosis. Aortic valve mean gradient measures 3.0 mmHg. Aortic valve peak gradient measures 5.3 mmHg. Aortic valve area, by VTI measures 1.69 cm. Pulmonic Valve: The pulmonic valve was not well visualized. Pulmonic valve regurgitation is mild. No evidence of pulmonic stenosis. Aorta: The aortic root and ascending aorta are structurally normal, with no evidence of dilitation. IAS/Shunts: The interatrial septum was not well visualized.  LEFT VENTRICLE PLAX 2D LVIDd:         5.00 cm      Diastology LVIDs:         4.10 cm      LV e' medial:    4.03 cm/s LV PW:         1.30 cm      LV E/e' medial:  21.3 LV IVS:        1.50 cm      LV e' lateral:   8.59 cm/s LVOT diam:     2.00 cm      LV E/e' lateral: 10.0 LV SV:         36 LV SV Index:   17 LVOT Area:     3.14 cm  LV Volumes (MOD) LV vol d, MOD A2C: 178.0 ml LV vol d, MOD A4C: 207.0 ml LV vol s, MOD A2C: 165.0 ml LV vol s, MOD A4C: 96.2 ml LV SV MOD A2C:  13.0 ml LV SV MOD A4C:     207.0 ml LV SV MOD BP:      57.3 ml RIGHT VENTRICLE RV S prime:     18.00 cm/s TAPSE (M-mode): 4.8 cm LEFT ATRIUM              Index        RIGHT ATRIUM           Index LA diam:        5.30 cm  2.54 cm/m   RA Area:     17.30 cm LA Vol (A2C):   101.0 ml 48.46 ml/m  RA Volume:   48.50 ml  23.27 ml/m LA Vol (A4C):   127.0 ml 60.93 ml/m LA Biplane Vol: 118.0 ml 56.62 ml/m  AORTIC VALVE                    PULMONIC VALVE AV Area (Vmax):    1.30 cm     PV Vmax:        0.78 m/s AV Area (Vmean):   1.29 cm     PV Vmean:       53.600 cm/s AV Area (VTI):     1.69 cm     PV VTI:         0.152 m AV Vmax:           114.67 cm/s  PV Peak grad:   2.4 mmHg AV Vmean:          76.567 cm/s  PV Mean grad:   1.5 mmHg AV VTI:            0.214 m      RVOT Peak grad: 4 mmHg AV Peak Grad:      5.3 mmHg AV Mean Grad:      3.0 mmHg LVOT Vmax:         47.60 cm/s LVOT Vmean:        31.500 cm/s LVOT VTI:          0.115 m LVOT/AV VTI ratio: 0.54  AORTA Ao Root diam: 2.93  cm MITRAL VALVE               TRICUSPID VALVE MV Area (PHT): 4.80 cm    TR Peak grad:   27.2 mmHg MV Area VTI:   1.02 cm    TR Vmax:        261.00 cm/s MV Peak grad:  6.1 mmHg MV Mean grad:  2.0 mmHg    SHUNTS MV Vmax:       1.23 m/s    Systemic VTI:  0.12 m MV Vmean:      61.9 cm/s   Systemic Diam: 2.00 cm MV Decel Time: 158 msec    Pulmonic VTI:  0.217 m MV E velocity: 85.70 cm/s MV A velocity: 99.40 cm/s MV E/A ratio:  0.86 Donnelly Angelica Electronically signed by Donnelly Angelica Signature Date/Time: 09/30/2021/12:59:07 PM    Final    CT HEAD CODE STROKE WO CONTRAST  Result Date: 09/29/2021 CLINICAL DATA:  Code stroke. EXAM: CT HEAD WITHOUT CONTRAST TECHNIQUE: Contiguous axial images were obtained from the base of the skull through the vertex without intravenous contrast. COMPARISON:  None. FINDINGS: Brain: No evidence of acute infarction, hemorrhage, hydrocephalus, extra-axial collection or mass lesion/mass effect. Vascular: No hyperdense vessel or unexpected calcification. Skull: Normal. Negative for fracture or focal lesion. Sinuses/Orbits: No acute finding Other: These results were communicated to Dr. Rory Percy at 8:30 am on 09/29/2021 by  text page via the [facility] messaging system. ASPECTS [facility] Stroke Program Early CT Score) Not scored without localizing symptom IMPRESSION: Motion degraded head CT without acute finding. Electronically Signed   By: Jorje Guild M.D.   On: 09/29/2021 08:31   CT ANGIO HEAD NECK W WO CM W PERF (CODE STROKE)  Result Date: 09/29/2021 CLINICAL DATA:  Acute stroke suspected EXAM: CT ANGIOGRAPHY HEAD AND NECK TECHNIQUE: Multidetector CT imaging of the head and neck was performed using the standard protocol during bolus administration of intravenous contrast. Multiplanar CT image reconstructions and MIPs were obtained to evaluate the vascular anatomy. Carotid stenosis measurements (when applicable) are obtained utilizing NASCET criteria, using the distal internal carotid diameter as the  denominator. CONTRAST:  Dose is not known on this in progress study COMPARISON:  Noncontrast head CT earlier the same day FINDINGS: CTA NECK FINDINGS Aortic arch: Elongated and atheromatous aorta. Right carotid system: Diffuse atheromatous plaque. Mixed density, mainly calcified plaque at the bifurcation and bulb with proximal ICA stenosis measuring up to 50%. Left carotid system: Atheromatous wall thickening. Calcified plaque at the proximal ICA without associated stenosis. Subsequently is a vessel kink which narrows the lumen to approximately 55%. No ulceration or dissection. Vertebral arteries: Proximal subclavian atherosclerosis and tortuosity without flow limiting stenosis. The right vertebral artery is dominant. Calcified plaque at the right vertebral origin causing 50% stenosis based on sagittal reformats. No dissection or beading. Skeleton: No acute or aggressive finding. Other neck: Negative Upper chest: Small pleural effusions. Airway cuffing and interlobular septal thickening. Review of the MIP images confirms the above findings CTA HEAD FINDINGS Anterior circulation: Extensive atheromatous plaque along the carotid siphons. No significant stenosis, proximal occlusion, aneurysm, or vascular malformation. Posterior circulation: No significant stenosis, proximal occlusion, aneurysm, or vascular malformation. Venous sinuses: Diffusely patent Anatomic variants: None significant Review of the MIP images confirms the above findings IMPRESSION: 1. No emergent finding. 2. Atherosclerosis with ~50% narrowing at the bilateral proximal ICA and right vertebral origin. 3. Probable CHF. Electronically Signed   By: Jorje Guild M.D.   On: 09/29/2021 08:56    EKG: Sinus rhythm with frequent PVCs.  Poor R wave progression.  Lateral T wave abnormality.  ASSESSMENT AND PLAN:  [name] is a 88 year old male with a history of HFrEF (EF 30%), mild aortic stenosis, CAD (CTO LAD, moderate to severe disease in OM),  moderate pulmonary hypertension, hypertension, myasthenia gravis who was admitted to the hospital with shortness of breath.  Cardiology is consulted for management of acute on chronic congestive heart failure. Has improved with diuresis but may also have exacerbation of MG playing a role.   # Heart failure with reduced EF (LVEF 30%) #Acute hypoxic and hypercapnic respiratory failure # Myasthenia gravis EF previously estimated at 30%, with moderate pulmonary hypertension, and mild aortic stenosis.  He has been intermittently compliant recently with his Lasix, and in addition has been enjoying holiday meals with significant increase in salt intake as a result. He has improved some with diuresis.  - Appreciate neurology input. They recommend treating the HF and holding off on IVIG for now due to its large volume.  -Lasix 40 mg IV given once yesterday. Reordered 40mg  IV this morning.  -Beta-blocker contraindicated in myasthenia gravis -BP is still soft at low 034V systolic - but will attempt to initiate ACE/ARB later in his hospital course for afterload reduction and GDMT. -Wean BiPAP as tolerated, encourage ambulation with PT/OT and monitoring of I&Os (none recorded on 09/30/21)   #  CAD Underwent heart catheterization in 2015 which showed a CTO of his mid LAD, and severe disease in his OM.  This appears to have been medically managed.  Not currently having chest pain. -EKG shows poor R wave progression consistent with prior septal infarct. -Continue aspirin 81 mg indefinitely -Continue pravastatin 20 mg daily.  Given his age and comorbidities, we will not change this to a high intensity statin therapy at this time.  Signed: Tristan Schroeder, PA-C 10/01/2021, 10:23 AM

## 2021-10-01 NOTE — Progress Notes (Signed)
PROGRESS NOTE   HPI was taken from Dr. Cyd Silence: 88 year old male with past medical history of systolic and diastolic congestive heart failure (Echo 12/2019 EF 30-35% wtih G3DD), myasthenia gravis, benign prostatic hyperplasia, hypertension, coronary artery disease, lymphedema, hyperlipidemia who presents to [facility] emergency department with complaints of shortness of breath.   Patient explains that approximately 3 days ago he began to develop shortness of breath.  Initially shortness of breath was mild in intensity but progressively became more more severe.  Shortness of breath is worse with exertion and improved with rest.  Patient explains that over the same period of time he has been experiencing progressively worsening bilateral lower extremity swelling.  Patient denies any associated chest pain, cough, fever, sick contacts, recent travel or contact with confirmed COVID-19 infection.   Patient symptoms continued to worsen until eventually EMS was contacted.  EMS promptly came to evaluate the patient and patient was placed on 4 L of supplemental oxygen and brought into [facility] emergency department for evaluation.   Upon evaluation in the emergency department chest x-ray was performed revealing mild pulmonary edema.  BNP was performed and was found to be elevated at 869.  Patient was felt to be suffering from acute congestive heart failure and 40 mg of intravenous Lasix was administered.  The hospitalist group was then called to assess the patient for admission to the hospital.   Hospital course from Dr. Jimmye Norman 1/1-10/01/21: Pt presented w/ shortness of breath likely secondary to CHF exacerbation. Pt initially required BiPAP and has since been weaned off. Of note, pt was placed back on BiPAP last night but it was removed this morning and pt is now back on 3L [place]. Pt will need oxygen desat test prior to d/c. Furthermore, pt has hx of myasthenia gravis and Lambert eaton syndrome and neuro believes pt's MG &  [name] eaton syndrome are playing a part in pt's current clinical picture. Neuro will start the pt on IVIG x 5 doses. For more information, please see previous progress/consult notes.     DONEL OSOWSKI  TIR:[id number] DOB: November 06, 1925 DOA: 09/28/2021 PCP: [name], MD    Assessment & Plan:   Principal Problem:   Acute on chronic combined systolic and diastolic CHF (congestive heart failure) ([place]) Active Problems:   Coronary artery disease involving native coronary artery of native heart without angina pectoris   Acute respiratory failure with hypoxia (HCC)   Essential hypertension   Mixed hyperlipidemia   Lambert-Eaton myasthenic syndrome (HCC)   Benign prostatic hyperplasia  Acute on chronic combined CHF exacerbation: w/ increasing peripheral edema, dyspnea on exertion & weight gain. Dietary indiscretions and intermittent noncompliance w/ lasix. Monitor I/Os and daily weights. Neg approx >1.5 L. Continue on IV lasix. Cardio following and recs apprec   Acute hypoxic & hypercarbic respiratory failure: likely secondary to above. Continue on supplemental oxygen and wean as tolerated,currently on 3L    Hx of CAD: denies chest pain. Continue on statin, aspirin    Myasthenia gravis & [name]: continue on home dose of pyridostigmine, cellcept. Will start IVIG x 5 doses today as per neuro. Neuro following and recs apprec   HLD: continue on statin   BPH: continue on home dose of flomax, proscar     DVT prophylaxis: lovenox  Code Status: full  Family Communication: discussed pt's care w/ pt's family at bedside and answered their questions  Disposition Plan: likely d/c home w/ HH   Level of care: Telemetry Cardiac  Status is: Inpatient  Remains inpatient appropriate because: severity of illness, still requiring supplemental oxygen & started on IVIG today     Consultants:  Neuro Cardio ICU  Procedures:   Antimicrobials:   Subjective: Pt c/o  shortness of breath and malaise   Objective: Vitals:   10/01/21 1353 10/01/21 1408 10/01/21 1423 10/01/21 1522  BP: 120/61 106/62 121/80 119/68  Pulse: 77 70 66 73  Resp: 17 16 19 20   Temp: 98.5 F (36.9 C) 98.5 F (36.9 C) 98.6 F (37 C) 98.5 F (36.9 C)  TempSrc: Oral Oral Oral Oral  SpO2: 98% 95% 96%   Weight:      Height:        Intake/Output Summary (Last 24 hours) at 10/01/2021 1540 Last data filed at 10/01/2021 1455 Gross per 24 hour  Intake 850 ml  Output 2500 ml  Net -1650 ml   Filed Weights   09/28/21 1739 09/30/21 0500  Weight: 93.4 kg 90.4 kg    Examination:  General exam: Appears calm but uncomfortable   Respiratory system: decreased breath sounds b/l  Cardiovascular system: S1 & S2+. No rubs or gallops  Gastrointestinal system: Abd is soft, NT, ND & hypoactive bowel sounds  Central nervous system: Alert and awake. Moves all extremities  Psychiatry: Judgement and insight appears normal. Flat mood and affect    Data Reviewed: I have personally reviewed following labs and imaging studies  CBC: Recent Labs  Lab 09/28/21 1751 09/29/21 0500 09/30/21 0555 10/01/21 0905  WBC 6.7 6.8 6.8 6.9  NEUTROABS  --  5.3  --   --   HGB 13.4 13.5 11.9* 12.0*  HCT 43.8 44.6 40.4 39.1  MCV 96.1 97.4 98.3 95.4  PLT 200 207 174 956   Basic Metabolic Panel: Recent Labs  Lab 09/28/21 1751 09/29/21 0500 09/30/21 0555 10/01/21 0905  NA 139 137 138 136  K 4.4 4.5 4.2 3.8  CL 103 99 96* 94*  CO2 29 29 37* 36*  GLUCOSE 142* 114* 111* 107*  BUN 22 21 22 21   CREATININE 0.95 0.99 1.13 0.89  CALCIUM 8.3* 8.8* 8.3* 8.4*  MG  --  2.1  --   --    GFR: Estimated Creatinine Clearance: 58.7 mL/min (by C-G formula based on SCr of 0.89 mg/dL). Liver Function Tests: Recent Labs  Lab 09/29/21 0500  AST 23  ALT 21  ALKPHOS 73  BILITOT 1.1  PROT 6.8  ALBUMIN 3.9   No results for input(s): LIPASE, AMYLASE in the last 168 hours. No results for input(s): AMMONIA in  the last 168 hours. Coagulation Profile: Recent Labs  Lab 09/29/21 1528  INR 1.1   Cardiac Enzymes: No results for input(s): CKTOTAL, CKMB, CKMBINDEX, TROPONINI in the last 168 hours. BNP (last 3 results) No results for input(s): PROBNP in the last 8760 hours. HbA1C: No results for input(s): HGBA1C in the last 72 hours. CBG: Recent Labs  Lab 09/29/21 0845  GLUCAP 108*   Lipid Profile: No results for input(s): CHOL, HDL, LDLCALC, TRIG, CHOLHDL, LDLDIRECT in the last 72 hours. Thyroid Function Tests: Recent Labs    09/29/21 1528  TSH 1.543  FREET4 1.12   Anemia Panel: No results for input(s): VITAMINB12, FOLATE, FERRITIN, TIBC, IRON, RETICCTPCT in the last 72 hours. Sepsis Labs: Recent Labs  Lab 09/29/21 1204  PROCALCITON <0.10    Recent Results (from the past 240 hour(s))  Resp Panel by RT-PCR (Flu A&B, Covid) Nasopharyngeal Swab     Status: None   Collection Time: 09/28/21  5:51 PM   Specimen: Nasopharyngeal Swab; Nasopharyngeal(NP) swabs in vial transport medium  Result Value Ref Range Status   SARS Coronavirus 2 by RT PCR NEGATIVE NEGATIVE Final    Comment: (NOTE) SARS-CoV-2 target nucleic acids are NOT DETECTED.  The SARS-CoV-2 RNA is generally detectable in upper respiratory specimens during the acute phase of infection. The lowest concentration of SARS-CoV-2 viral copies this assay can detect is 138 copies/mL. A negative result does not preclude SARS-Cov-2 infection and should not be used as the sole basis for treatment or other patient management decisions. A negative result may occur with  improper specimen collection/handling, submission of specimen other than nasopharyngeal swab, presence of viral mutation(s) within the areas targeted by this assay, and inadequate number of viral copies(<138 copies/mL). A negative result must be combined with clinical observations, patient history, and epidemiological information. The expected result is  Negative.  Fact Sheet for Patients:  EntrepreneurPulse.com.au  Fact Sheet for Healthcare Providers:  IncredibleEmployment.be  This test is no t yet approved or cleared by the [place] FDA and  has been authorized for detection and/or diagnosis of SARS-CoV-2 by FDA under an Emergency Use Authorization (EUA). This EUA will remain  in effect (meaning this test can be used) for the duration of the COVID-19 declaration under Section 564(b)(1) of the Act, 21 U.S.C.section 360bbb-3(b)(1), unless the authorization is terminated  or revoked sooner.       Influenza A by PCR NEGATIVE NEGATIVE Final   Influenza B by PCR NEGATIVE NEGATIVE Final    Comment: (NOTE) The Xpert Xpress SARS-CoV-2/FLU/RSV plus assay is intended as an aid in the diagnosis of influenza from Nasopharyngeal swab specimens and should not be used as a sole basis for treatment. Nasal washings and aspirates are unacceptable for Xpert Xpress SARS-CoV-2/FLU/RSV testing.  Fact Sheet for Patients: EntrepreneurPulse.com.au  Fact Sheet for Healthcare Providers: IncredibleEmployment.be  This test is not yet approved or cleared by the [place] FDA and has been authorized for detection and/or diagnosis of SARS-CoV-2 by FDA under an Emergency Use Authorization (EUA). This EUA will remain in effect (meaning this test can be used) for the duration of the COVID-19 declaration under Section 564(b)(1) of the Act, 21 U.S.C. section 360bbb-3(b)(1), unless the authorization is terminated or revoked.  Performed at [facility], [address], [place], [place] [address]          Radiology Studies: ECHOCARDIOGRAM COMPLETE  Result Date: 09/30/2021    ECHOCARDIOGRAM REPORT   Patient Name:   [name] Date of Exam: 09/30/2021 Medical Rec #:  [id number]       Height:       70.0 in Accession #:    [id number]      Weight:       199.3 lb Date  of Birth:  [birth year]       BSA:          2.084 m Patient Age:    27 years        BP:           124/64 mmHg Patient Gender: M               HR:           66 bpm. Exam Location:  [facility] Procedure: 2D Echo, Cardiac Doppler and Color Doppler Indications:     CHF-acute systolic V49.44  History:         Patient has prior history of Echocardiogram examinations, most  recent 01/15/2020. CAD; Risk Factors:Hypertension.  Sonographer:     Sherrie Sport Referring Phys:  [id number] [place] Diagnosing Phys: Donnelly Angelica  Sonographer Comments: Suboptimal apical window. IMPRESSIONS  1. Left ventricular ejection fraction, by estimation, is 30 to 35%. The left ventricle has moderately decreased function. The left ventricle demonstrates regional wall motion abnormalities (see scoring diagram/findings for description). Left ventricular  diastolic parameters are consistent with Grade II diastolic dysfunction (pseudonormalization).  2. Right ventricular systolic function is normal. The right ventricular size is not well visualized.  3. Left atrial size was severely dilated.  4. The mitral valve is normal in structure. Mild mitral valve regurgitation. No evidence of mitral stenosis.  5. The aortic valve is calcified. Aortic valve regurgitation is not visualized. Aortic valve sclerosis/calcification is present, without any evidence of aortic stenosis. Comparison(s): No significant change from prior study. FINDINGS  Left Ventricle: Left ventricular ejection fraction, by estimation, is 30 to 35%. The left ventricle has moderately decreased function. The left ventricle demonstrates regional wall motion abnormalities. Severe hypokinesis of the left ventricular, entire  apical segment and anteroseptal wall. The left ventricular internal cavity size was normal in size. There is no left ventricular hypertrophy. Left ventricular diastolic parameters are consistent with Grade II diastolic dysfunction (pseudonormalization). Right  Ventricle: The right ventricular size is not well visualized. Right vetricular wall thickness was not well visualized. Right ventricular systolic function is normal. Left Atrium: Left atrial size was severely dilated. Right Atrium: Right atrial size was normal in size. Pericardium: There is no evidence of pericardial effusion. Mitral Valve: The mitral valve is normal in structure. Mild mitral valve regurgitation. No evidence of mitral valve stenosis. MV peak gradient, 6.1 mmHg. The mean mitral valve gradient is 2.0 mmHg. Tricuspid Valve: The tricuspid valve is not well visualized. Tricuspid valve regurgitation is trivial. Aortic Valve: The aortic valve is calcified. Aortic valve regurgitation is not visualized. Aortic valve sclerosis/calcification is present, without any evidence of aortic stenosis. Aortic valve mean gradient measures 3.0 mmHg. Aortic valve peak gradient measures 5.3 mmHg. Aortic valve area, by VTI measures 1.69 cm. Pulmonic Valve: The pulmonic valve was not well visualized. Pulmonic valve regurgitation is mild. No evidence of pulmonic stenosis. Aorta: The aortic root and ascending aorta are structurally normal, with no evidence of dilitation. IAS/Shunts: The interatrial septum was not well visualized.  LEFT VENTRICLE PLAX 2D LVIDd:         5.00 cm      Diastology LVIDs:         4.10 cm      LV e' medial:    4.03 cm/s LV PW:         1.30 cm      LV E/e' medial:  21.3 LV IVS:        1.50 cm      LV e' lateral:   8.59 cm/s LVOT diam:     2.00 cm      LV E/e' lateral: 10.0 LV SV:         36 LV SV Index:   17 LVOT Area:     3.14 cm  LV Volumes (MOD) LV vol d, MOD A2C: 178.0 ml LV vol d, MOD A4C: 207.0 ml LV vol s, MOD A2C: 165.0 ml LV vol s, MOD A4C: 96.2 ml LV SV MOD A2C:     13.0 ml LV SV MOD A4C:     207.0 ml LV SV MOD BP:      57.3 ml RIGHT  VENTRICLE RV S prime:     18.00 cm/s TAPSE (M-mode): 4.8 cm LEFT ATRIUM              Index        RIGHT ATRIUM           Index LA diam:        5.30 cm  2.54  cm/m   RA Area:     17.30 cm LA Vol (A2C):   101.0 ml 48.46 ml/m  RA Volume:   48.50 ml  23.27 ml/m LA Vol (A4C):   127.0 ml 60.93 ml/m LA Biplane Vol: 118.0 ml 56.62 ml/m  AORTIC VALVE                    PULMONIC VALVE AV Area (Vmax):    1.30 cm     PV Vmax:        0.78 m/s AV Area (Vmean):   1.29 cm     PV Vmean:       53.600 cm/s AV Area (VTI):     1.69 cm     PV VTI:         0.152 m AV Vmax:           114.67 cm/s  PV Peak grad:   2.4 mmHg AV Vmean:          76.567 cm/s  PV Mean grad:   1.5 mmHg AV VTI:            0.214 m      RVOT Peak grad: 4 mmHg AV Peak Grad:      5.3 mmHg AV Mean Grad:      3.0 mmHg LVOT Vmax:         47.60 cm/s LVOT Vmean:        31.500 cm/s LVOT VTI:          0.115 m LVOT/AV VTI ratio: 0.54  AORTA Ao Root diam: 2.93 cm MITRAL VALVE               TRICUSPID VALVE MV Area (PHT): 4.80 cm    TR Peak grad:   27.2 mmHg MV Area VTI:   1.02 cm    TR Vmax:        261.00 cm/s MV Peak grad:  6.1 mmHg MV Mean grad:  2.0 mmHg    SHUNTS MV Vmax:       1.23 m/s    Systemic VTI:  0.12 m MV Vmean:      61.9 cm/s   Systemic Diam: 2.00 cm MV Decel Time: 158 msec    Pulmonic VTI:  0.217 m MV E velocity: 85.70 cm/s MV A velocity: 99.40 cm/s MV E/A ratio:  0.86 Donnelly Angelica Electronically signed by Donnelly Angelica Signature Date/Time: 09/30/2021/12:59:07 PM    Final         Scheduled Meds:  aspirin EC  81 mg Oral Daily   Chlorhexidine Gluconate Cloth  6 each Topical Daily   enoxaparin (LOVENOX) injection  40 mg Subcutaneous Q24H   finasteride  5 mg Oral Daily   furosemide  40 mg Intravenous BID   mycophenolate  1,000 mg Oral BID   polyvinyl alcohol  1 drop Both Eyes Daily   pravastatin  20 mg Oral q1800   prednisoLONE acetate  1 drop Right Eye Daily   pyridostigmine  30 mg Oral 6 X Daily   tamsulosin  0.4 mg Oral Daily   Continuous Infusions:  Immune Globulin 10%  217 mL/hr at 10/01/21 1408     LOS: 3 days    Time spent: 28 mins     Wyvonnia Dusky, MD [facility] Pager  [phone]  If 7PM-7AM, please contact night-coverage 10/01/2021, 3:40 PM

## 2021-10-01 NOTE — Progress Notes (Signed)
PT Cancellation Note  Patient Details Name: [name] MRN: [id number] DOB: 1926/06/25   Cancelled Treatment:    Reason Eval/Treat Not Completed: Patient declined, no reason specified. Chart reviewed. Pt sitting EOB with visitor in room. He politely declines PT this date and asks for PT to return at later date. No reason specified. PT educated on benefits of mobility and encouraged sitting EOB for all meals and/or transferring to recliner as desired. PT will continue to follow and treat at pt is appropriate and willing.    Patrina Levering PT, DPT 10/01/21 3:11 PM [phone]

## 2021-10-01 NOTE — Progress Notes (Signed)
NIF -25cmH2O VC 1.39L

## 2021-10-02 LAB — CBC
HCT: 40.3 % (ref 39.0–52.0)
Hemoglobin: 12.4 g/dL — ABNORMAL LOW (ref 13.0–17.0)
MCH: 28.7 pg (ref 26.0–34.0)
MCHC: 30.8 g/dL (ref 30.0–36.0)
MCV: 93.3 fL (ref 80.0–100.0)
Platelets: 184 10*3/uL (ref 150–400)
RBC: 4.32 MIL/uL (ref 4.22–5.81)
RDW: 13.8 % (ref 11.5–15.5)
WBC: 4.5 10*3/uL (ref 4.0–10.5)
nRBC: 0 % (ref 0.0–0.2)

## 2021-10-02 LAB — BASIC METABOLIC PANEL
Anion gap: 11 (ref 5–15)
BUN: 22 mg/dL (ref 8–23)
CO2: 36 mmol/L — ABNORMAL HIGH (ref 22–32)
Calcium: 8.5 mg/dL — ABNORMAL LOW (ref 8.9–10.3)
Chloride: 93 mmol/L — ABNORMAL LOW (ref 98–111)
Creatinine, Ser: 1 mg/dL (ref 0.61–1.24)
GFR, Estimated: >60 mL/min (ref >60)
Glucose, Bld: 105 mg/dL — ABNORMAL HIGH (ref 70–99)
Potassium: 3.7 mmol/L (ref 3.5–5.1)
Sodium: 140 mmol/L (ref 135–145)

## 2021-10-02 MED ORDER — IMMUNE GLOBULIN (HUMAN) 10 GM/100ML IV SOLN
400.0000 mg/kg | Freq: Once | INTRAVENOUS | Status: AC
  Administered 2021-10-03: 35 g via INTRAVENOUS
  Filled 2021-10-02: qty 350

## 2021-10-02 MED ORDER — IMMUNE GLOBULIN (HUMAN) 10 GM/100ML IV SOLN
400.0000 mg/kg | Freq: Once | INTRAVENOUS | Status: AC
  Administered 2021-10-04: 35 g via INTRAVENOUS
  Filled 2021-10-02: qty 350

## 2021-10-02 MED ORDER — FUROSEMIDE 10 MG/ML IJ SOLN
40.0000 mg | Freq: Every day | INTRAMUSCULAR | Status: AC
  Administered 2021-10-02 – 2021-10-03 (×2): 40 mg via INTRAVENOUS
  Filled 2021-10-02 (×2): qty 4

## 2021-10-02 MED ORDER — IMMUNE GLOBULIN (HUMAN) 10 GM/100ML IV SOLN
400.0000 mg/kg | Freq: Once | INTRAVENOUS | Status: AC
  Administered 2021-10-05: 35 g via INTRAVENOUS
  Filled 2021-10-02: qty 350

## 2021-10-02 NOTE — Progress Notes (Signed)
NIF:  -30cmH20 VC:  1.28L

## 2021-10-02 NOTE — Progress Notes (Signed)
PROGRESS NOTE   HPI was taken from Dr. Cyd Silence: 88 year old male with past medical history of systolic and diastolic congestive heart failure (Echo 12/2019 EF 30-35% wtih G3DD), myasthenia gravis, benign prostatic hyperplasia, hypertension, coronary artery disease, lymphedema, hyperlipidemia who presents to [facility] emergency department with complaints of shortness of breath.   Patient explains that approximately 3 days ago he began to develop shortness of breath.  Initially shortness of breath was mild in intensity but progressively became more more severe.  Shortness of breath is worse with exertion and improved with rest.  Patient explains that over the same period of time he has been experiencing progressively worsening bilateral lower extremity swelling.  Patient denies any associated chest pain, cough, fever, sick contacts, recent travel or contact with confirmed COVID-19 infection.   Patient symptoms continued to worsen until eventually EMS was contacted.  EMS promptly came to evaluate the patient and patient was placed on 4 L of supplemental oxygen and brought into [facility] emergency department for evaluation.   Upon evaluation in the emergency department chest x-ray was performed revealing mild pulmonary edema.  BNP was performed and was found to be elevated at 869.  Patient was felt to be suffering from acute congestive heart failure and 40 mg of intravenous Lasix was administered.  The hospitalist group was then called to assess the patient for admission to the hospital.   Hospital course from Dr. Jimmye Norman 1/1-10/01/21: Pt presented w/ shortness of breath likely secondary to CHF exacerbation. Pt initially required BiPAP and has since been weaned off. Of note, pt was placed back on BiPAP last night but it was removed this morning and pt is now back on 3L [place]. Pt will need oxygen desat test prior to d/c. Furthermore, pt has hx of myasthenia gravis and Lambert eaton syndrome and neuro believes pt's MG &  [name] eaton syndrome are playing a part in pt's current clinical picture. Neuro will start the pt on IVIG x 5 doses. For more information, please see previous progress/consult notes.     [name]  RWE:[id number] DOB: [birth year] DOA: 09/28/2021 PCP: [name], MD    Assessment & Plan:   Principal Problem:   Acute on chronic combined systolic and diastolic CHF (congestive heart failure) ([place]) Active Problems:   Coronary artery disease involving native coronary artery of native heart without angina pectoris   Acute respiratory failure with hypoxia (HCC)   Essential hypertension   Mixed hyperlipidemia   Lambert-Eaton myasthenic syndrome (HCC)   Benign prostatic hyperplasia  Acute on chronic combined CHF exacerbation: w/ increasing peripheral edema, dyspnea on exertion & weight gain. Dietary indiscretions and intermittent noncompliance w/ lasix. Monitor I/Os and daily weights. Neg approx >1.5 L. Continue on IV lasix. Cardio following and recs apprec   Acute hypoxic & hypercarbic respiratory failure: likely secondary to above. Continue on supplemental oxygen and wean as tolerated,currently on 2L [place]   Hx of CAD: denies chest pain. Continue on statin, aspirin    Myasthenia gravis & [name]: continue on home dose of pyridostigmine, cellcept. Have started IVIG x 5 doses today as per neuro (began 1/3). Neuro following and recs apprec   HLD: continue on statin   BPH: continue on home dose of flomax, proscar     DVT prophylaxis: lovenox  Code Status: full  Family Communication: son updated @ bedside 1/4 Disposition Plan: likely d/c home w/ HH (does not want SNF)  Level of care: Telemetry Cardiac  Status is: Inpatient  Remains inpatient  appropriate because: severity of illness, still requiring supplemental oxygen & started on IVIG today     Consultants:  Neuro Cardio  Procedures:   Antimicrobials:   Subjective: Pt c/o shortness of breath and  malaise, improving  Objective: Vitals:   10/02/21 1300 10/02/21 1315 10/02/21 1330 10/02/21 1400  BP: 127/62 116/61 (!) 127/57 (!) 136/51  Pulse: 61 60 (!) 50 60  Resp: 17 19 18 15   Temp:      TempSrc:      SpO2: 92% 96% 98% 99%  Weight:      Height:        Intake/Output Summary (Last 24 hours) at 10/02/2021 1421 Last data filed at 10/02/2021 1200 Gross per 24 hour  Intake 840 ml  Output 2850 ml  Net -2010 ml   Filed Weights   09/28/21 1739 09/30/21 0500 10/02/21 0500  Weight: 93.4 kg 90.4 kg 91.9 kg    Examination:  General exam: Appears calm but uncomfortable   Respiratory system: decreased breath sounds b/l  Cardiovascular system: S1 & S2+. No rubs or gallops  Gastrointestinal system: Abd is soft, NT, ND & hypoactive bowel sounds  Central nervous system: Alert and awake. Moves all extremities  Psychiatry: Judgement and insight appears normal. Flat mood and affect    Data Reviewed: I have personally reviewed following labs and imaging studies  CBC: Recent Labs  Lab 09/28/21 1751 09/29/21 0500 09/30/21 0555 10/01/21 0905 10/02/21 0648  WBC 6.7 6.8 6.8 6.9 4.5  NEUTROABS  --  5.3  --   --   --   HGB 13.4 13.5 11.9* 12.0* 12.4*  HCT 43.8 44.6 40.4 39.1 40.3  MCV 96.1 97.4 98.3 95.4 93.3  PLT 200 207 174 179 865   Basic Metabolic Panel: Recent Labs  Lab 09/28/21 1751 09/29/21 0500 09/30/21 0555 10/01/21 0905 10/02/21 0648  NA 139 137 138 136 140  K 4.4 4.5 4.2 3.8 3.7  CL 103 99 96* 94* 93*  CO2 29 29 37* 36* 36*  GLUCOSE 142* 114* 111* 107* 105*  BUN 22 21 22 21 22   CREATININE 0.95 0.99 1.13 0.89 1.00  CALCIUM 8.3* 8.8* 8.3* 8.4* 8.5*  MG  --  2.1  --   --   --    GFR: Estimated Creatinine Clearance: 52.6 mL/min (by C-G formula based on SCr of 1 mg/dL). Liver Function Tests: Recent Labs  Lab 09/29/21 0500  AST 23  ALT 21  ALKPHOS 73  BILITOT 1.1  PROT 6.8  ALBUMIN 3.9   No results for input(s): LIPASE, AMYLASE in the last 168 hours. No  results for input(s): AMMONIA in the last 168 hours. Coagulation Profile: Recent Labs  Lab 09/29/21 1528  INR 1.1   Cardiac Enzymes: No results for input(s): CKTOTAL, CKMB, CKMBINDEX, TROPONINI in the last 168 hours. BNP (last 3 results) No results for input(s): PROBNP in the last 8760 hours. HbA1C: No results for input(s): HGBA1C in the last 72 hours. CBG: Recent Labs  Lab 09/29/21 0845  GLUCAP 108*   Lipid Profile: No results for input(s): CHOL, HDL, LDLCALC, TRIG, CHOLHDL, LDLDIRECT in the last 72 hours. Thyroid Function Tests: Recent Labs    09/29/21 1528  TSH 1.543  FREET4 1.12   Anemia Panel: No results for input(s): VITAMINB12, FOLATE, FERRITIN, TIBC, IRON, RETICCTPCT in the last 72 hours. Sepsis Labs: Recent Labs  Lab 09/29/21 1204  PROCALCITON <0.10    Recent Results (from the past 240 hour(s))  Resp Panel by RT-PCR (Flu  A&B, Covid) Nasopharyngeal Swab     Status: None   Collection Time: 09/28/21  5:51 PM   Specimen: Nasopharyngeal Swab; Nasopharyngeal(NP) swabs in vial transport medium  Result Value Ref Range Status   SARS Coronavirus 2 by RT PCR NEGATIVE NEGATIVE Final    Comment: (NOTE) SARS-CoV-2 target nucleic acids are NOT DETECTED.  The SARS-CoV-2 RNA is generally detectable in upper respiratory specimens during the acute phase of infection. The lowest concentration of SARS-CoV-2 viral copies this assay can detect is 138 copies/mL. A negative result does not preclude SARS-Cov-2 infection and should not be used as the sole basis for treatment or other patient management decisions. A negative result may occur with  improper specimen collection/handling, submission of specimen other than nasopharyngeal swab, presence of viral mutation(s) within the areas targeted by this assay, and inadequate number of viral copies(<138 copies/mL). A negative result must be combined with clinical observations, patient history, and epidemiological information. The  expected result is Negative.  Fact Sheet for Patients:  EntrepreneurPulse.com.au  Fact Sheet for Healthcare Providers:  IncredibleEmployment.be  This test is no t yet approved or cleared by the [place] FDA and  has been authorized for detection and/or diagnosis of SARS-CoV-2 by FDA under an Emergency Use Authorization (EUA). This EUA will remain  in effect (meaning this test can be used) for the duration of the COVID-19 declaration under Section 564(b)(1) of the Act, 21 U.S.C.section 360bbb-3(b)(1), unless the authorization is terminated  or revoked sooner.       Influenza A by PCR NEGATIVE NEGATIVE Final   Influenza B by PCR NEGATIVE NEGATIVE Final    Comment: (NOTE) The Xpert Xpress SARS-CoV-2/FLU/RSV plus assay is intended as an aid in the diagnosis of influenza from Nasopharyngeal swab specimens and should not be used as a sole basis for treatment. Nasal washings and aspirates are unacceptable for Xpert Xpress SARS-CoV-2/FLU/RSV testing.  Fact Sheet for Patients: EntrepreneurPulse.com.au  Fact Sheet for Healthcare Providers: IncredibleEmployment.be  This test is not yet approved or cleared by the [place] FDA and has been authorized for detection and/or diagnosis of SARS-CoV-2 by FDA under an Emergency Use Authorization (EUA). This EUA will remain in effect (meaning this test can be used) for the duration of the COVID-19 declaration under Section 564(b)(1) of the Act, 21 U.S.C. section 360bbb-3(b)(1), unless the authorization is terminated or revoked.  Performed at [facility], [address], [place], [place] [address]          Radiology Studies: No results found.      Scheduled Meds:  aspirin EC  81 mg Oral Daily   Chlorhexidine Gluconate Cloth  6 each Topical Daily   enoxaparin (LOVENOX) injection  40 mg Subcutaneous Q24H   finasteride  5 mg Oral Daily    mycophenolate  1,000 mg Oral BID   polyvinyl alcohol  1 drop Both Eyes Daily   pravastatin  20 mg Oral q1800   prednisoLONE acetate  1 drop Right Eye Daily   pyridostigmine  30 mg Oral 6 X Daily   tamsulosin  0.4 mg Oral Daily   Continuous Infusions:  Immune Globulin 10% 217 mL/hr at 10/02/21 1245     LOS: 4 days    Time spent:30 mins     Desma Maxim, MD [facility]  If 7PM-7AM, please contact night-coverage 10/02/2021, 2:21 PM

## 2021-10-02 NOTE — Progress Notes (Signed)
Physical Therapy Treatment Patient Details Name: [name] MRN: [id number] DOB: 13-Nov-1925 Today's Date: 10/02/2021   History of Present Illness 88 year old male with a history of HFrEF (EF 30%), mild aortic stenosis, CAD (CTO LAD, moderate to severe disease in OM), moderate pulmonary hypertension, hypertension, myasthenia gravis who was admitted to the hospital with shortness of breath. During hospital cource was noted to be less responsive and nonverbal with a code stroke initiated. Imaging unremarkable.    PT Comments    Patient received in bed, son at bedside. Patient agreeable to PT. He is mod independent with bed mobility. Transfers with supervision. Patient ambulated 2x around nursing station with RW and min guard to supervision. O2 monitor alarming at one point during walk, but may have been that he was grasping with hand on walker where monitor was. With loosening grip his O2 sats were in 90%s. Patient will continue to benefit from skilled PT while here to improve functional independence and strength for safety at home.        Recommendations for follow up therapy are one component of a multi-disciplinary discharge planning process, led by the attending physician.  Recommendations may be updated based on patient status, additional functional criteria and insurance authorization.  Follow Up Recommendations  Home health PT     Assistance Recommended at Discharge Intermittent Supervision/Assistance  Patient can return home with the following A little help with bathing/dressing/bathroom;A little help with walking and/or transfers   Equipment Recommendations  Rolling walker (2 wheels)    Recommendations for Other Services       Precautions / Restrictions Precautions Precautions: Fall     Mobility  Bed Mobility Overal bed mobility: Modified Independent             General bed mobility comments: use of bed rails, assist with lines    Transfers Overall transfer  level: Needs assistance Equipment used: Rolling walker (2 wheels) Transfers: Sit to/from Stand Sit to Stand: Supervision                Ambulation/Gait Ambulation/Gait assistance: Supervision;Min guard Gait Distance (Feet): 400 Feet Assistive device: Rolling walker (2 wheels) Gait Pattern/deviations: Step-through pattern Gait velocity: WFL     General Gait Details: Patient ambulates with RW , Cues for proximity to RW and safety at times. Telemetry alarming due to low O2, however appeared to be incorrect due to patient gripping walker tightly   Stairs             Wheelchair Mobility    Modified Rankin (Stroke Patients Only)       Balance Overall balance assessment: Needs assistance Sitting-balance support: Feet supported Sitting balance-Leahy Scale: Good Sitting balance - Comments: good posture in seated position, able to move LE's without LOB   Standing balance support: During functional activity;No upper extremity supported;Bilateral upper extremity supported Standing balance-Leahy Scale: Good Standing balance comment: Patient able to stand and use urinal without UE support, no lob. Using RW for ambulation out in hallway. Supervision to min guard.                            Cognition Arousal/Alertness: Awake/alert Behavior During Therapy: WFL for tasks assessed/performed Overall Cognitive Status: Within Functional Limits for tasks assessed                                 General Comments: Patient A and  O x 4        Exercises      General Comments        Pertinent Vitals/Pain Pain Assessment: No/denies pain    Home Living                          Prior Function            PT Goals (current goals can now be found in the care plan section) Acute Rehab PT Goals Patient Stated Goal: to return home PT Goal Formulation: With patient Time For Goal Achievement: 10/14/21 Potential to Achieve Goals:  Good Progress towards PT goals: Progressing toward goals    Frequency    Min 2X/week      PT Plan      Co-evaluation              AM-PAC PT "6 Clicks" Mobility   Outcome Measure  Help needed turning from your back to your side while in a flat bed without using bedrails?: None Help needed moving from lying on your back to sitting on the side of a flat bed without using bedrails?: None Help needed moving to and from a bed to a chair (including a wheelchair)?: A Little Help needed standing up from a chair using your arms (e.g., wheelchair or bedside chair)?: A Little Help needed to walk in hospital room?: A Little Help needed climbing 3-5 steps with a railing? : A Little 6 Click Score: 20    End of Session Equipment Utilized During Treatment: Gait belt;Oxygen Activity Tolerance: Patient tolerated treatment well Patient left: in bed;with call bell/phone within reach;with family/visitor present;Other (comment) (sitting up on side of bed) Nurse Communication: Mobility status PT Visit Diagnosis: Muscle weakness (generalized) (M62.81)     Time: 8756-4332 PT Time Calculation (min) (ACUTE ONLY): 19 min  Charges:  $Gait Training: 8-22 mins                     Aaniyah Strohm, PT, GCS 10/02/21,4:04 PM

## 2021-10-02 NOTE — Progress Notes (Signed)
NIF -27cmH2O VC 1.14 L

## 2021-10-02 NOTE — Progress Notes (Signed)
OT Cancellation Note  Patient Details Name: [name] MRN: [id number] DOB: 09-28-1926   Cancelled Treatment:    Reason Eval/Treat Not Completed: Other (comment). Attempted to see pt for therapy services. Primary RN requests hold at this time while pt on IV IG. Will hold and re-attempt at a later time/date as available and pt medically appropriate for OT services.   Shara Blazing, M.S., OTR/L [facility] - [facility] Ascom: [phone] 10/02/21, 2:20 PM

## 2021-10-02 NOTE — Progress Notes (Signed)
[place] NOTE  Patient ID: ARTEMIS LOYAL MRN: [id number] DOB/AGE: 1926-07-26 88 y.o.  Admit date: 09/28/2021 Referring Physician Eppie Gibson Primary Physician [name], MD Primary Cardiologist Serafina Royals  Reason for Consultation AoCHF  HPI:  Decarlos Empey is a 88 year old male with a history of HFrEF (EF 30%), mild aortic stenosis, CAD (CTO LAD, moderate to severe disease in OM), moderate pulmonary hypertension, hypertension, myasthenia gravis who was admitted to the hospital with shortness of breath.  Cardiology is consulted for management of acute on chronic congestive heart failure.  Interval history -reports feeling much better after starting IVIG -on O2 by [place] this morning -good UOP with lasix (-2.5L yesterday), compression stocking helping with LEE -denies CP, SOB.  Review of systems complete and found to be negative unless listed above     Past Medical History:  Diagnosis Date   Abnormal chest CT 03/28/2017   Allergy    Anemia    Angina pectoris ([place]) 04/02/2011   Aortic aneurysm ([place]) 03/28/2017   Saw Dr Genevive Bi 04/16/17 - recommended f/u CT in 3 months.     Atherosclerosis of both carotid arteries 07/17/2014   Benign lipomatous neoplasm of skin and subcutaneous tissue of left arm 10/04/2013   Bilateral carotid artery stenosis 08/30/2014   Overview:  Less than 50% 2015   Bladder cancer ([place]) 03/05/2013   CAD (coronary artery disease) 06/18/2014   Cancer ([place]) 11-24-12   bladder. BCG treatments by Dr Jacqlyn Larsen.   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 WD SCC. Re-shaved 01/14/2016. Excised 02/05/2016, margins free.   Squamous cell carcinoma of skin 11/03/2018   Right cheek ant. to sideburn. Poorly differentiated.   Squamous cell carcinoma of skin 02/02/2019   Right mid dorsum forearm. WD SCC. EDC.    Past Surgical History:  Procedure Laterality Date   [place]    Medications Prior to Admission  Medication Sig Dispense Refill Last Dose   aspirin 81 MG tablet Take 81 mg by mouth daily.   09/28/2021   finasteride (PROSCAR) 5 MG tablet Take 5 mg by mouth daily.   09/28/2021   hydroxypropyl methylcellulose / hypromellose (ISOPTO TEARS / GONIOVISC) 2.5 % ophthalmic solution Place 1 drop into both eyes daily.   09/28/2021   mycophenolate (CELLCEPT) 250 MG capsule Take 1,000 mg by mouth 2 (two) times daily.    09/28/2021   pravastatin (PRAVACHOL) 20 MG tablet Take 1 tablet (20 mg total) by mouth daily. 90 tablet 1 09/28/2021   prednisoLONE acetate (PRED FORTE) 1 % ophthalmic suspension Place 1 drop into the right eye daily. Per patient. 5 mL 0  09/28/2021   pyridostigmine (MESTINON) 60 MG tablet Take 30 mg by mouth 6 (six) times daily.    09/28/2021   tamsulosin (FLOMAX) 0.4 MG CAPS Take 0.4 mg by mouth daily.   09/28/2021   furosemide (LASIX) 40 MG tablet Take 1 tablet (40 mg total) by mouth daily. 90 tablet 3    tacrolimus (PROTOPIC) 0.1 % ointment Apply topically 2 (two) times daily. 100 g 2     Social History   Socioeconomic History   Marital status: Married    Spouse name: Not on file   Number of children:  Not on file   Years of education: Not on file   Highest education level: Not on file  Occupational History   Not on file  Tobacco Use   Smoking status: Former    Types: Cigarettes    Quit date: 09/29/1958    Years since quitting: 63.0   Smokeless tobacco: Never  Vaping Use   Vaping Use: Never used  Substance and Sexual Activity   Alcohol use: Yes    Alcohol/week: 0.0 standard drinks    Comment: occasionally   Drug use: No   Sexual activity: Never  Other Topics Concern   Not on file  Social History Narrative   Pt is married with 2 children - 1 son, 1 daughter. Previously self-employed in [place] Strain: Low Risk    Difficulty of Paying Living Expenses: Not hard at all  Food Insecurity: No Food Insecurity   Worried About Charity fundraiser in the Last Year: Never true   Arboriculturist in the Last Year: Never true  Transportation Needs: No Transportation Needs   Lack of Transportation (Medical): No   Lack of Transportation (Non-Medical): No  Physical Activity: Not on file  Stress: No Stress Concern Present   Feeling of Stress : Not at all  Social Connections: Unknown   Frequency of Communication with Friends and Family: More than three times a week   Frequency of Social Gatherings with Friends and Family: More than three times a week   Attends Religious Services: Not on file   Active Member of Clubs or Organizations: Not on file   Attends Archivist Meetings: Not on file   Marital Status: Widowed  Human resources officer Violence: Not At Risk   Fear of Current or Ex-Partner: No   Emotionally Abused: No   Physically Abused: No   Sexually Abused: No    Family History  Problem Relation Age of Onset   Lung cancer Sister    Prostate cancer Brother    Lung cancer Brother    Arthritis Other        parent   Colon cancer Neg Hx       Review of systems complete and found to be negative unless listed above       PHYSICAL EXAM  General: Elderly appearing Caucasian male examined sitting on edge of PCU bed with son at bedside, reading a newspaper HEENT:  Normocephalic and atramatic Neck:  No JVD Lungs: Course breath sounds throughout.  On O2 by nasal cannula Heart: HRRR, frequent extra beats. 2/6 systolic murmur  Abdomen: Nondistended appearing Msk: Normal strength and tone for age. Extremities: Compression stockings on bilateral lower extremities. Neuro: Alert and oriented x3 no focal deficit Psych:  Responds appropriately  Labs:   Lab Results  Component Value Date   WBC 4.5 10/02/2021   HGB 12.4 (  L) 10/02/2021   HCT 40.3 10/02/2021   MCV 93.3 10/02/2021   PLT 184 10/02/2021    Recent Labs  Lab 09/29/21 0500 09/30/21 0555 10/02/21 0648  NA 137   < > 140  K 4.5   < > 3.7  CL 99   < > 93*  CO2 29   < > 36*  BUN 21   < > 22  CREATININE 0.99   < > 1.00  CALCIUM 8.8*   < > 8.5*  PROT 6.8  --   --   BILITOT 1.1  --   --   ALKPHOS 73  --   --   ALT 21  --   --   AST 23  --   --   GLUCOSE 114*   < > 105*   < > = values in this interval not displayed.    Lab Results  Component Value Date   TROPONINI 0.43 [facility]) 12/29/2016     Lab Results  Component Value Date   CHOL 140 02/22/2021   CHOL 159 11/15/2020   CHOL 155 07/16/2020   Lab Results  Component Value Date   HDL 44.70 02/22/2021   HDL 57.20 11/15/2020   HDL 53.80 07/16/2020   Lab Results  Component Value Date   LDLCALC 71 02/22/2021   LDLCALC 87 11/15/2020   LDLCALC 86 07/16/2020   Lab Results  Component Value Date   TRIG 121.0 02/22/2021   TRIG 74.0 11/15/2020   TRIG 77.0 07/16/2020   Lab Results  Component Value Date   CHOLHDL 3 02/22/2021   CHOLHDL 3 11/15/2020   CHOLHDL 3 07/16/2020   Lab Results  Component Value Date   LDLDIRECT 143.5 08/26/2013      Radiology: DG Chest 1 View  Result Date: 09/29/2021 CLINICAL DATA:  Shortness of breath. Patient has a history of congestive heart  failure. EXAM: CHEST  1 VIEW COMPARISON:  September 28, 2021 5:44 p.m. FINDINGS: The heart size is enlarged. Mediastinal contour is stable. There is increased pulmonary interstitium bilaterally worsened compared prior exam. Probable small left pleural effusion is identified. Osseous structures are stable. IMPRESSION: Congestive heart failure worsened compared prior exam. Electronically Signed   By: Abelardo Diesel M.D.   On: 09/29/2021 09:27   DG Chest 2 View  Result Date: 09/28/2021 CLINICAL DATA:  Short of breath for the past 3 days. EXAM: CHEST - 2 VIEW COMPARISON:  01/13/2020. FINDINGS: Cardiac silhouette mildly enlarged.  No mediastinal or hilar masses. Bilateral interstitial thickening. Additional opacities noted in the left lung base, likely atelectasis. No pleural effusion.  No pneumothorax. Skeletal structures are demineralized, but grossly intact. IMPRESSION: 1. Cardiomegaly with interstitial thickening. Suspect mild congestive heart failure. Electronically Signed   By: Lajean Manes M.D.   On: 09/28/2021 18:22   ECHOCARDIOGRAM COMPLETE  Result Date: 09/30/2021    ECHOCARDIOGRAM REPORT   Patient Name:   BENTLEIGH STANKUS Date of Exam: 09/30/2021 Medical Rec #:  [id number]       Height:       70.0 in Accession #:    [id number]      Weight:       199.3 lb Date of Birth:  11-Oct-1925       BSA:          2.084 m Patient Age:    29 years        BP:           124/64 mmHg Patient Gender: M  HR:           66 bpm. Exam Location:  [facility] Procedure: 2D Echo, Cardiac Doppler and Color Doppler Indications:     CHF-acute systolic T70.17  History:         Patient has prior history of Echocardiogram examinations, most                  recent 01/15/2020. CAD; Risk Factors:Hypertension.  Sonographer:     Sherrie Sport Referring Phys:  [id number] [place] Diagnosing Phys: Donnelly Angelica  Sonographer Comments: Suboptimal apical window. IMPRESSIONS  1. Left ventricular ejection fraction, by estimation, is 30 to 35%.  The left ventricle has moderately decreased function. The left ventricle demonstrates regional wall motion abnormalities (see scoring diagram/findings for description). Left ventricular  diastolic parameters are consistent with Grade II diastolic dysfunction (pseudonormalization).  2. Right ventricular systolic function is normal. The right ventricular size is not well visualized.  3. Left atrial size was severely dilated.  4. The mitral valve is normal in structure. Mild mitral valve regurgitation. No evidence of mitral stenosis.  5. The aortic valve is calcified. Aortic valve regurgitation is not visualized. Aortic valve sclerosis/calcification is present, without any evidence of aortic stenosis. Comparison(s): No significant change from prior study. FINDINGS  Left Ventricle: Left ventricular ejection fraction, by estimation, is 30 to 35%. The left ventricle has moderately decreased function. The left ventricle demonstrates regional wall motion abnormalities. Severe hypokinesis of the left ventricular, entire  apical segment and anteroseptal wall. The left ventricular internal cavity size was normal in size. There is no left ventricular hypertrophy. Left ventricular diastolic parameters are consistent with Grade II diastolic dysfunction (pseudonormalization). Right Ventricle: The right ventricular size is not well visualized. Right vetricular wall thickness was not well visualized. Right ventricular systolic function is normal. Left Atrium: Left atrial size was severely dilated. Right Atrium: Right atrial size was normal in size. Pericardium: There is no evidence of pericardial effusion. Mitral Valve: The mitral valve is normal in structure. Mild mitral valve regurgitation. No evidence of mitral valve stenosis. MV peak gradient, 6.1 mmHg. The mean mitral valve gradient is 2.0 mmHg. Tricuspid Valve: The tricuspid valve is not well visualized. Tricuspid valve regurgitation is trivial. Aortic Valve: The aortic valve  is calcified. Aortic valve regurgitation is not visualized. Aortic valve sclerosis/calcification is present, without any evidence of aortic stenosis. Aortic valve mean gradient measures 3.0 mmHg. Aortic valve peak gradient measures 5.3 mmHg. Aortic valve area, by VTI measures 1.69 cm. Pulmonic Valve: The pulmonic valve was not well visualized. Pulmonic valve regurgitation is mild. No evidence of pulmonic stenosis. Aorta: The aortic root and ascending aorta are structurally normal, with no evidence of dilitation. IAS/Shunts: The interatrial septum was not well visualized.  LEFT VENTRICLE PLAX 2D LVIDd:         5.00 cm      Diastology LVIDs:         4.10 cm      LV e' medial:    4.03 cm/s LV PW:         1.30 cm      LV E/e' medial:  21.3 LV IVS:        1.50 cm      LV e' lateral:   8.59 cm/s LVOT diam:     2.00 cm      LV E/e' lateral: 10.0 LV SV:         36 LV SV Index:   17 LVOT Area:  3.14 cm  LV Volumes (MOD) LV vol d, MOD A2C: 178.0 ml LV vol d, MOD A4C: 207.0 ml LV vol s, MOD A2C: 165.0 ml LV vol s, MOD A4C: 96.2 ml LV SV MOD A2C:     13.0 ml LV SV MOD A4C:     207.0 ml LV SV MOD BP:      57.3 ml RIGHT VENTRICLE RV S prime:     18.00 cm/s TAPSE (M-mode): 4.8 cm LEFT ATRIUM              Index        RIGHT ATRIUM           Index LA diam:        5.30 cm  2.54 cm/m   RA Area:     17.30 cm LA Vol (A2C):   101.0 ml 48.46 ml/m  RA Volume:   48.50 ml  23.27 ml/m LA Vol (A4C):   127.0 ml 60.93 ml/m LA Biplane Vol: 118.0 ml 56.62 ml/m  AORTIC VALVE                    PULMONIC VALVE AV Area (Vmax):    1.30 cm     PV Vmax:        0.78 m/s AV Area (Vmean):   1.29 cm     PV Vmean:       53.600 cm/s AV Area (VTI):     1.69 cm     PV VTI:         0.152 m AV Vmax:           114.67 cm/s  PV Peak grad:   2.4 mmHg AV Vmean:          76.567 cm/s  PV Mean grad:   1.5 mmHg AV VTI:            0.214 m      RVOT Peak grad: 4 mmHg AV Peak Grad:      5.3 mmHg AV Mean Grad:      3.0 mmHg LVOT Vmax:         47.60 cm/s LVOT  Vmean:        31.500 cm/s LVOT VTI:          0.115 m LVOT/AV VTI ratio: 0.54  AORTA Ao Root diam: 2.93 cm MITRAL VALVE               TRICUSPID VALVE MV Area (PHT): 4.80 cm    TR Peak grad:   27.2 mmHg MV Area VTI:   1.02 cm    TR Vmax:        261.00 cm/s MV Peak grad:  6.1 mmHg MV Mean grad:  2.0 mmHg    SHUNTS MV Vmax:       1.23 m/s    Systemic VTI:  0.12 m MV Vmean:      61.9 cm/s   Systemic Diam: 2.00 cm MV Decel Time: 158 msec    Pulmonic VTI:  0.217 m MV E velocity: 85.70 cm/s MV A velocity: 99.40 cm/s MV E/A ratio:  0.86 Donnelly Angelica Electronically signed by Donnelly Angelica Signature Date/Time: 09/30/2021/12:59:07 PM    Final    CT HEAD CODE STROKE WO CONTRAST  Result Date: 09/29/2021 CLINICAL DATA:  Code stroke. EXAM: CT HEAD WITHOUT CONTRAST TECHNIQUE: Contiguous axial images were obtained from the base of the skull through the vertex without intravenous contrast. COMPARISON:  None. FINDINGS: Brain: No evidence of acute  infarction, hemorrhage, hydrocephalus, extra-axial collection or mass lesion/mass effect. Vascular: No hyperdense vessel or unexpected calcification. Skull: Normal. Negative for fracture or focal lesion. Sinuses/Orbits: No acute finding Other: These results were communicated to Dr. Rory Percy at 8:30 am on 09/29/2021 by text page via the [facility] messaging system. ASPECTS [facility] Stroke Program Early CT Score) Not scored without localizing symptom IMPRESSION: Motion degraded head CT without acute finding. Electronically Signed   By: Jorje Guild M.D.   On: 09/29/2021 08:31   CT ANGIO HEAD NECK W WO CM W PERF (CODE STROKE)  Result Date: 09/29/2021 CLINICAL DATA:  Acute stroke suspected EXAM: CT ANGIOGRAPHY HEAD AND NECK TECHNIQUE: Multidetector CT imaging of the head and neck was performed using the standard protocol during bolus administration of intravenous contrast. Multiplanar CT image reconstructions and MIPs were obtained to evaluate the vascular anatomy. Carotid stenosis measurements (when  applicable) are obtained utilizing NASCET criteria, using the distal internal carotid diameter as the denominator. CONTRAST:  Dose is not known on this in progress study COMPARISON:  Noncontrast head CT earlier the same day FINDINGS: CTA NECK FINDINGS Aortic arch: Elongated and atheromatous aorta. Right carotid system: Diffuse atheromatous plaque. Mixed density, mainly calcified plaque at the bifurcation and bulb with proximal ICA stenosis measuring up to 50%. Left carotid system: Atheromatous wall thickening. Calcified plaque at the proximal ICA without associated stenosis. Subsequently is a vessel kink which narrows the lumen to approximately 55%. No ulceration or dissection. Vertebral arteries: Proximal subclavian atherosclerosis and tortuosity without flow limiting stenosis. The right vertebral artery is dominant. Calcified plaque at the right vertebral origin causing 50% stenosis based on sagittal reformats. No dissection or beading. Skeleton: No acute or aggressive finding. Other neck: Negative Upper chest: Small pleural effusions. Airway cuffing and interlobular septal thickening. Review of the MIP images confirms the above findings CTA HEAD FINDINGS Anterior circulation: Extensive atheromatous plaque along the carotid siphons. No significant stenosis, proximal occlusion, aneurysm, or vascular malformation. Posterior circulation: No significant stenosis, proximal occlusion, aneurysm, or vascular malformation. Venous sinuses: Diffusely patent Anatomic variants: None significant Review of the MIP images confirms the above findings IMPRESSION: 1. No emergent finding. 2. Atherosclerosis with ~50% narrowing at the bilateral proximal ICA and right vertebral origin. 3. Probable CHF. Electronically Signed   By: Jorje Guild M.D.   On: 09/29/2021 08:56    EKG: Sinus rhythm with frequent PVCs.  Poor R wave progression.  Lateral T wave abnormality.  ASSESSMENT AND PLAN:  Napoleon Monacelli is a 88 year old male with  a history of HFrEF (EF 30%), mild aortic stenosis, CAD (CTO LAD, moderate to severe disease in OM), moderate pulmonary hypertension, hypertension, myasthenia gravis who was admitted to the hospital with shortness of breath.  Cardiology is consulted for management of acute on chronic congestive heart failure. Has improved with diuresis but may also have exacerbation of MG playing a role.   # Heart failure with reduced EF (LVEF 30%) #Acute hypoxic and hypercapnic respiratory failure # Myasthenia gravis EF previously estimated at 30%, with moderate pulmonary hypertension, and mild aortic stenosis.  He has been intermittently compliant recently with his Lasix, and in addition has been enjoying holiday meals with significant increase in salt intake as a result. He has improved some with diuresis.  -Patient started on IVIG yesterday and reports remarkable improvement in his breathing and overall feels better.  We will continue to monitor his volume status and increase lasix dosing as below to account for IVIG.  -Patient had good urine output  with IV Lasix 40 mg twice daily.  We will continue that regimen while he is hospitalized. -Beta-blocker contraindicated in myasthenia gravis -BP is improving slightly today 127/62.  Plan to initiate low-dose ACE/ARB tomorrow if blood pressure remains low 973 systolic for afterload reduction and GDMT. -Wean oxygen as tolerated, encourage ambulation with PT/OT and monitoring of I&Os   #CAD Underwent heart catheterization in 2015 which showed a CTO of his mid LAD, and severe disease in his OM.  This appears to have been medically managed.  Continues to deny chest pain. -EKG shows poor R wave progression consistent with prior septal infarct. -Continue aspirin 81 mg indefinitely -Continue pravastatin 20 mg daily.  Given his age and comorbidities, we will not change this to a high intensity statin therapy at this time.  This patient's case was discussed with Dr. Isaias Cowman and he is in agreement with this plan of care as above.   Signed: Tristan Schroeder, PA-C 10/02/2021, 3:04 PM

## 2021-10-02 NOTE — Progress Notes (Signed)
Subjective: He feels similar to yesterday. States that he was SOB at bedtime yesterday and could not sleep, but then when bipap was placed he went to sleep right away.   Exam: Vitals:   10/02/21 1330 10/02/21 1400  BP: (!) 127/57 (!) 136/51  Pulse: (!) 50 60  Resp: 18 15  Temp:    SpO2: 98% 99%   Gen: In bed, NAD Resp: non-labored breathing, no acute distress. Counts to 26 on a single breath(imprved from 14 on Monday) Abd: soft, nt  Neuro: MS: Awake, alert, interactive and appropriate CN: He has conjugate gaze, pupils are reactive bilaterally.  He does not have disc conjugation with sustained upgaze, nor does he have ptosis.  His palate elevates symmetrically and well.  His smile is normal and not transverse. Motor: he has good strength bilaterally without significant fatigueability.  Sensory:intact to LT  Pertinent Labs: ACHr + from 2014 P/Q type calcium channel antibodies form 2014 - positive   Impression: 88 yo M with myasthenia gravis(MG) as evidenced by positive Ach-R antibodies in 2014 and consistent symptoms.The presence of acetyl-choline receptor antibodies confirms MG.  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  He presented with an acute CHF exacerbation. Whether MG is playing a role or not has been difficult to tease out. The fact that he has been hypercarbic and the fact that he gets so much relief with bi-pap made me suspect MG is playing a role. I am hesitant to use steroids alone as these can worsen an exacerbation and his NIF/FVC are tenuous. Also, given his age and previous good control with cellcept, I feel that holding off on steroids may be prudent. He was therefore started on IVIG.   Recommendations: 1) continue treatment of underlying CHF exacerbation 2) continue home pyridostigmine and mycophenolate 3) IVIG day 2/5  Roland Rack, MD [facility] [phone]  If 7pm-  7am, please page neurology on call as listed in [place].

## 2021-10-02 NOTE — Progress Notes (Signed)
Patient states that he does not wish to do VC and NIF that he is very sleepy and will do next time.

## 2021-10-02 NOTE — Progress Notes (Signed)
Mobility Specialist - Progress Note   10/02/21 1352  Mobility  Activity Contraindicated/medical hold  Mobility performed by Mobility specialist    Per discussion with RN, request to hold on mobility at this time as pt is on IG IV. Will attempt another date/time as appropriate.    Kathee Delton Mobility Specialist 10/02/21, 1:53 PM

## 2021-10-03 LAB — ACETYLCHOLINE RECEPTOR AB, ALL
Acety choline binding ab: 5.35 nmol/L — ABNORMAL HIGH (ref 0.00–0.24)
Acetylchol Block Ab: 38 % — ABNORMAL HIGH (ref 0–25)

## 2021-10-03 LAB — BASIC METABOLIC PANEL
Anion gap: 10 (ref 5–15)
BUN: 22 mg/dL (ref 8–23)
CO2: 37 mmol/L — ABNORMAL HIGH (ref 22–32)
Calcium: 8.3 mg/dL — ABNORMAL LOW (ref 8.9–10.3)
Chloride: 93 mmol/L — ABNORMAL LOW (ref 98–111)
Creatinine, Ser: 1.06 mg/dL (ref 0.61–1.24)
GFR, Estimated: >60 mL/min (ref >60)
Glucose, Bld: 102 mg/dL — ABNORMAL HIGH (ref 70–99)
Potassium: 3.6 mmol/L (ref 3.5–5.1)
Sodium: 140 mmol/L (ref 135–145)

## 2021-10-03 MED ORDER — FUROSEMIDE 10 MG/ML IJ SOLN
40.0000 mg | Freq: Two times a day (BID) | INTRAMUSCULAR | Status: DC
  Administered 2021-10-03 – 2021-10-05 (×4): 40 mg via INTRAVENOUS
  Filled 2021-10-03 (×4): qty 4

## 2021-10-03 NOTE — Progress Notes (Addendum)
[facility] [place] NOTE  Patient ID: JOASH TONY MRN: [id number] DOB/AGE: 86-Apr-1927 86 y.o.  Admit date: 09/28/2021 Referring Physician Eppie Gibson Primary Physician [name], MD Primary Cardiologist Serafina Royals  Reason for Consultation AoCHF  HPI:  Arn Mcomber is a 88 year old male with a history of HFrEF (EF 30%), mild aortic stenosis, CAD (CTO LAD, moderate to severe disease in OM), moderate pulmonary hypertension, hypertension, myasthenia gravis who was admitted to the hospital with shortness of breath.  Cardiology is consulted for management of acute on chronic congestive heart failure.  Interval history -Reports feeling even better than yesterday, on day 3/5 of IVIG -Denies chest pain, palpitations, worsening lower extremity edema -1.7L output yesterday  Review of systems complete and found to be negative unless listed above     Past Medical History:  Diagnosis Date   Abnormal chest CT 03/28/2017   Allergy    Anemia    Angina pectoris ([place]) 04/02/2011   Aortic aneurysm ([place]) 03/28/2017   Saw Dr Genevive Bi 04/16/17 - recommended f/u CT in 3 months.     Atherosclerosis of both carotid arteries 07/17/2014   Benign lipomatous neoplasm of skin and subcutaneous tissue of left arm 10/04/2013   Bilateral carotid artery stenosis 08/30/2014   Overview:  Less than 50% 2015   Bladder cancer ([place]) 03/05/2013   CAD (coronary artery disease) 06/18/2014   Cancer ([place]) 11-24-12   bladder. BCG treatments by Dr Jacqlyn Larsen.   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 WD SCC. Re-shaved 01/14/2016. Excised 02/05/2016, margins free.   Squamous cell carcinoma of skin 11/03/2018   Right cheek ant. to sideburn. Poorly differentiated.   Squamous cell carcinoma of skin 02/02/2019   Right mid dorsum forearm. WD SCC. EDC.    Past Surgical History:  Procedure Laterality Date   [place]    Medications Prior to Admission  Medication Sig Dispense Refill Last Dose   aspirin 81 MG tablet Take 81 mg by mouth daily.   09/28/2021   finasteride (PROSCAR) 5 MG tablet Take 5 mg by mouth daily.   09/28/2021   hydroxypropyl methylcellulose / hypromellose (ISOPTO TEARS / GONIOVISC) 2.5 % ophthalmic solution Place 1 drop into both eyes daily.   09/28/2021   mycophenolate (CELLCEPT) 250 MG capsule Take 1,000 mg by mouth 2 (two) times daily.    09/28/2021   pravastatin (PRAVACHOL) 20 MG tablet Take 1 tablet (20 mg total) by mouth daily. 90 tablet 1 09/28/2021   prednisoLONE acetate (PRED FORTE) 1 % ophthalmic suspension Place 1 drop into the right eye daily. Per patient. 5 mL 0 09/28/2021   pyridostigmine (MESTINON)  60 MG tablet Take 30 mg by mouth 6 (six) times daily.    09/28/2021   tamsulosin (FLOMAX) 0.4 MG CAPS Take 0.4 mg by mouth daily.   09/28/2021   furosemide (LASIX) 40 MG tablet Take 1 tablet (40 mg total) by mouth daily. 90 tablet 3    tacrolimus (PROTOPIC) 0.1 % ointment Apply topically 2 (two) times daily. 100 g 2     Social History   Socioeconomic History   Marital status: Married    Spouse name: Not on file   Number of children: Not on file   Years  of education: Not on file   Highest education level: Not on file  Occupational History   Not on file  Tobacco Use   Smoking status: Former    Types: Cigarettes    Quit date: 09/29/1958    Years since quitting: 63.0   Smokeless tobacco: Never  Vaping Use   Vaping Use: Never used  Substance and Sexual Activity   Alcohol use: Yes    Alcohol/week: 0.0 standard drinks    Comment: occasionally   Drug use: No   Sexual activity: Never  Other Topics Concern   Not on file  Social History Narrative   Pt is married with 2 children - 1 son, 1 daughter. Previously self-employed in [place] Strain: Low Risk    Difficulty of Paying Living Expenses: Not hard at all  Food Insecurity: No Food Insecurity   Worried About Charity fundraiser in the Last Year: Never true   Arboriculturist in the Last Year: Never true  Transportation Needs: No Transportation Needs   Lack of Transportation (Medical): No   Lack of Transportation (Non-Medical): No  Physical Activity: Not on file  Stress: No Stress Concern Present   Feeling of Stress : Not at all  Social Connections: Unknown   Frequency of Communication with Friends and Family: More than three times a week   Frequency of Social Gatherings with Friends and Family: More than three times a week   Attends Religious Services: Not on file   Active Member of Clubs or Organizations: Not on file   Attends Archivist Meetings: Not on file   Marital Status: Widowed  Human resources officer Violence: Not At Risk   Fear of Current or Ex-Partner: No   Emotionally Abused: No   Physically Abused: No   Sexually Abused: No    Family History  Problem Relation Age of Onset   Lung cancer Sister    Prostate cancer Brother    Lung cancer Brother    Arthritis Other        parent   Colon cancer Neg Hx       Review of systems complete and found to be negative unless listed above   PHYSICAL  EXAM  General: Elderly appearing Caucasian male examined sitting on edge of PCU bed with son at bedside. HEENT:  Normocephalic and atramatic Neck:  No JVD Lungs: Normal respiratory effort on O2 by nasal cannula. Clear to ascultation bilaterally. Heart: HRRR, frequent extra beats. 2/6 systolic murmur  Abdomen: Nondistended appearing Msk: Normal strength and tone for age. Extremities: Compression stockings on bilateral lower extremities. Some swelling at the top of the compression stocking L>R Neuro: Alert and oriented x3 no focal deficit Psych:  Responds appropriately  Labs:   Lab Results  Component Value Date   WBC 4.5 10/02/2021   HGB  12.4 (L) 10/02/2021   HCT 40.3 10/02/2021   MCV 93.3 10/02/2021   PLT 184 10/02/2021    Recent Labs  Lab 09/29/21 0500 09/30/21 0555 10/03/21 0653  NA 137   < > 140  K 4.5   < > 3.6  CL 99   < > 93*  CO2 29   < > 37*  BUN 21   < > 22  CREATININE 0.99   < > 1.06  CALCIUM 8.8*   < > 8.3*  PROT 6.8  --   --   BILITOT 1.1  --   --   ALKPHOS 73  --   --   ALT 21  --   --   AST 23  --   --   GLUCOSE 114*   < > 102*   < > = values in this interval not displayed.    Lab Results  Component Value Date   TROPONINI 0.43 [facility]) 12/29/2016     Lab Results  Component Value Date   CHOL 140 02/22/2021   CHOL 159 11/15/2020   CHOL 155 07/16/2020   Lab Results  Component Value Date   HDL 44.70 02/22/2021   HDL 57.20 11/15/2020   HDL 53.80 07/16/2020   Lab Results  Component Value Date   LDLCALC 71 02/22/2021   LDLCALC 87 11/15/2020   LDLCALC 86 07/16/2020   Lab Results  Component Value Date   TRIG 121.0 02/22/2021   TRIG 74.0 11/15/2020   TRIG 77.0 07/16/2020   Lab Results  Component Value Date   CHOLHDL 3 02/22/2021   CHOLHDL 3 11/15/2020   CHOLHDL 3 07/16/2020   Lab Results  Component Value Date   LDLDIRECT 143.5 08/26/2013      Radiology: DG Chest 1 View  Result Date: 09/29/2021 CLINICAL DATA:  Shortness of breath.  Patient has a history of congestive heart failure. EXAM: CHEST  1 VIEW COMPARISON:  September 28, 2021 5:44 p.m. FINDINGS: The heart size is enlarged. Mediastinal contour is stable. There is increased pulmonary interstitium bilaterally worsened compared prior exam. Probable small left pleural effusion is identified. Osseous structures are stable. IMPRESSION: Congestive heart failure worsened compared prior exam. Electronically Signed   By: Abelardo Diesel M.D.   On: 09/29/2021 09:27   DG Chest 2 View  Result Date: 09/28/2021 CLINICAL DATA:  Short of breath for the past 3 days. EXAM: CHEST - 2 VIEW COMPARISON:  01/13/2020. FINDINGS: Cardiac silhouette mildly enlarged.  No mediastinal or hilar masses. Bilateral interstitial thickening. Additional opacities noted in the left lung base, likely atelectasis. No pleural effusion.  No pneumothorax. Skeletal structures are demineralized, but grossly intact. IMPRESSION: 1. Cardiomegaly with interstitial thickening. Suspect mild congestive heart failure. Electronically Signed   By: Lajean Manes M.D.   On: 09/28/2021 18:22   ECHOCARDIOGRAM COMPLETE  Result Date: 09/30/2021    ECHOCARDIOGRAM REPORT   Patient Name:   MUKUND WEINREB Date of Exam: 09/30/2021 Medical Rec #:  [id number]       Height:       70.0 in Accession #:    [id number]      Weight:       199.3 lb Date of Birth:  1925/11/10       BSA:          2.084 m Patient Age:    80 years        BP:           124/64 mmHg Patient Gender: M  HR:           66 bpm. Exam Location:  [facility] Procedure: 2D Echo, Cardiac Doppler and Color Doppler Indications:     CHF-acute systolic B28.41  History:         Patient has prior history of Echocardiogram examinations, most                  recent 01/15/2020. CAD; Risk Factors:Hypertension.  Sonographer:     Sherrie Sport Referring Phys:  [id number] [place] Diagnosing Phys: Donnelly Angelica  Sonographer Comments: Suboptimal apical window. IMPRESSIONS  1. Left ventricular ejection  fraction, by estimation, is 30 to 35%. The left ventricle has moderately decreased function. The left ventricle demonstrates regional wall motion abnormalities (see scoring diagram/findings for description). Left ventricular  diastolic parameters are consistent with Grade II diastolic dysfunction (pseudonormalization).  2. Right ventricular systolic function is normal. The right ventricular size is not well visualized.  3. Left atrial size was severely dilated.  4. The mitral valve is normal in structure. Mild mitral valve regurgitation. No evidence of mitral stenosis.  5. The aortic valve is calcified. Aortic valve regurgitation is not visualized. Aortic valve sclerosis/calcification is present, without any evidence of aortic stenosis. Comparison(s): No significant change from prior study. FINDINGS  Left Ventricle: Left ventricular ejection fraction, by estimation, is 30 to 35%. The left ventricle has moderately decreased function. The left ventricle demonstrates regional wall motion abnormalities. Severe hypokinesis of the left ventricular, entire  apical segment and anteroseptal wall. The left ventricular internal cavity size was normal in size. There is no left ventricular hypertrophy. Left ventricular diastolic parameters are consistent with Grade II diastolic dysfunction (pseudonormalization). Right Ventricle: The right ventricular size is not well visualized. Right vetricular wall thickness was not well visualized. Right ventricular systolic function is normal. Left Atrium: Left atrial size was severely dilated. Right Atrium: Right atrial size was normal in size. Pericardium: There is no evidence of pericardial effusion. Mitral Valve: The mitral valve is normal in structure. Mild mitral valve regurgitation. No evidence of mitral valve stenosis. MV peak gradient, 6.1 mmHg. The mean mitral valve gradient is 2.0 mmHg. Tricuspid Valve: The tricuspid valve is not well visualized. Tricuspid valve regurgitation is  trivial. Aortic Valve: The aortic valve is calcified. Aortic valve regurgitation is not visualized. Aortic valve sclerosis/calcification is present, without any evidence of aortic stenosis. Aortic valve mean gradient measures 3.0 mmHg. Aortic valve peak gradient measures 5.3 mmHg. Aortic valve area, by VTI measures 1.69 cm. Pulmonic Valve: The pulmonic valve was not well visualized. Pulmonic valve regurgitation is mild. No evidence of pulmonic stenosis. Aorta: The aortic root and ascending aorta are structurally normal, with no evidence of dilitation. IAS/Shunts: The interatrial septum was not well visualized.  LEFT VENTRICLE PLAX 2D LVIDd:         5.00 cm      Diastology LVIDs:         4.10 cm      LV e' medial:    4.03 cm/s LV PW:         1.30 cm      LV E/e' medial:  21.3 LV IVS:        1.50 cm      LV e' lateral:   8.59 cm/s LVOT diam:     2.00 cm      LV E/e' lateral: 10.0 LV SV:         36 LV SV Index:   17 LVOT Area:  3.14 cm  LV Volumes (MOD) LV vol d, MOD A2C: 178.0 ml LV vol d, MOD A4C: 207.0 ml LV vol s, MOD A2C: 165.0 ml LV vol s, MOD A4C: 96.2 ml LV SV MOD A2C:     13.0 ml LV SV MOD A4C:     207.0 ml LV SV MOD BP:      57.3 ml RIGHT VENTRICLE RV S prime:     18.00 cm/s TAPSE (M-mode): 4.8 cm LEFT ATRIUM              Index        RIGHT ATRIUM           Index LA diam:        5.30 cm  2.54 cm/m   RA Area:     17.30 cm LA Vol (A2C):   101.0 ml 48.46 ml/m  RA Volume:   48.50 ml  23.27 ml/m LA Vol (A4C):   127.0 ml 60.93 ml/m LA Biplane Vol: 118.0 ml 56.62 ml/m  AORTIC VALVE                    PULMONIC VALVE AV Area (Vmax):    1.30 cm     PV Vmax:        0.78 m/s AV Area (Vmean):   1.29 cm     PV Vmean:       53.600 cm/s AV Area (VTI):     1.69 cm     PV VTI:         0.152 m AV Vmax:           114.67 cm/s  PV Peak grad:   2.4 mmHg AV Vmean:          76.567 cm/s  PV Mean grad:   1.5 mmHg AV VTI:            0.214 m      RVOT Peak grad: 4 mmHg AV Peak Grad:      5.3 mmHg AV Mean Grad:      3.0 mmHg  LVOT Vmax:         47.60 cm/s LVOT Vmean:        31.500 cm/s LVOT VTI:          0.115 m LVOT/AV VTI ratio: 0.54  AORTA Ao Root diam: 2.93 cm MITRAL VALVE               TRICUSPID VALVE MV Area (PHT): 4.80 cm    TR Peak grad:   27.2 mmHg MV Area VTI:   1.02 cm    TR Vmax:        261.00 cm/s MV Peak grad:  6.1 mmHg MV Mean grad:  2.0 mmHg    SHUNTS MV Vmax:       1.23 m/s    Systemic VTI:  0.12 m MV Vmean:      61.9 cm/s   Systemic Diam: 2.00 cm MV Decel Time: 158 msec    Pulmonic VTI:  0.217 m MV E velocity: 85.70 cm/s MV A velocity: 99.40 cm/s MV E/A ratio:  0.86 Donnelly Angelica Electronically signed by Donnelly Angelica Signature Date/Time: 09/30/2021/12:59:07 PM    Final    CT HEAD CODE STROKE WO CONTRAST  Result Date: 09/29/2021 CLINICAL DATA:  Code stroke. EXAM: CT HEAD WITHOUT CONTRAST TECHNIQUE: Contiguous axial images were obtained from the base of the skull through the vertex without intravenous contrast. COMPARISON:  None. FINDINGS: Brain: No evidence of acute  infarction, hemorrhage, hydrocephalus, extra-axial collection or mass lesion/mass effect. Vascular: No hyperdense vessel or unexpected calcification. Skull: Normal. Negative for fracture or focal lesion. Sinuses/Orbits: No acute finding Other: These results were communicated to Dr. Rory Percy at 8:30 am on 09/29/2021 by text page via the [facility] messaging system. ASPECTS [facility] Stroke Program Early CT Score) Not scored without localizing symptom IMPRESSION: Motion degraded head CT without acute finding. Electronically Signed   By: Jorje Guild M.D.   On: 09/29/2021 08:31   CT ANGIO HEAD NECK W WO CM W PERF (CODE STROKE)  Result Date: 09/29/2021 CLINICAL DATA:  Acute stroke suspected EXAM: CT ANGIOGRAPHY HEAD AND NECK TECHNIQUE: Multidetector CT imaging of the head and neck was performed using the standard protocol during bolus administration of intravenous contrast. Multiplanar CT image reconstructions and MIPs were obtained to evaluate the vascular anatomy.  Carotid stenosis measurements (when applicable) are obtained utilizing NASCET criteria, using the distal internal carotid diameter as the denominator. CONTRAST:  Dose is not known on this in progress study COMPARISON:  Noncontrast head CT earlier the same day FINDINGS: CTA NECK FINDINGS Aortic arch: Elongated and atheromatous aorta. Right carotid system: Diffuse atheromatous plaque. Mixed density, mainly calcified plaque at the bifurcation and bulb with proximal ICA stenosis measuring up to 50%. Left carotid system: Atheromatous wall thickening. Calcified plaque at the proximal ICA without associated stenosis. Subsequently is a vessel kink which narrows the lumen to approximately 55%. No ulceration or dissection. Vertebral arteries: Proximal subclavian atherosclerosis and tortuosity without flow limiting stenosis. The right vertebral artery is dominant. Calcified plaque at the right vertebral origin causing 50% stenosis based on sagittal reformats. No dissection or beading. Skeleton: No acute or aggressive finding. Other neck: Negative Upper chest: Small pleural effusions. Airway cuffing and interlobular septal thickening. Review of the MIP images confirms the above findings CTA HEAD FINDINGS Anterior circulation: Extensive atheromatous plaque along the carotid siphons. No significant stenosis, proximal occlusion, aneurysm, or vascular malformation. Posterior circulation: No significant stenosis, proximal occlusion, aneurysm, or vascular malformation. Venous sinuses: Diffusely patent Anatomic variants: None significant Review of the MIP images confirms the above findings IMPRESSION: 1. No emergent finding. 2. Atherosclerosis with ~50% narrowing at the bilateral proximal ICA and right vertebral origin. 3. Probable CHF. Electronically Signed   By: Jorje Guild M.D.   On: 09/29/2021 08:56    EKG: Sinus rhythm with frequent PVCs.  Poor R wave progression.  Lateral T wave abnormality.  ASSESSMENT AND PLAN:   Eryck Negron is a 88 year old male with a history of HFrEF (EF 30%), mild aortic stenosis, CAD (CTO LAD, moderate to severe disease in OM), moderate pulmonary hypertension, hypertension, myasthenia gravis who was admitted to the hospital with shortness of breath.  Cardiology is consulted for management of acute on chronic congestive heart failure. Has improved with diuresis but may also have exacerbation of MG playing a role.   # Heart failure with reduced EF (LVEF 30%) #Acute hypoxic and hypercapnic respiratory failure # Myasthenia gravis EF previously estimated at 30%, with moderate pulmonary hypertension, and mild aortic stenosis.  He has been intermittently compliant recently with his Lasix, and in addition has been enjoying holiday meals with significant increase in salt intake as a result. He has improved some with diuresis.  -Patient is on day 3/5 of IVIG and continues to improve clinically. We will continue to monitor his volume status and increase lasix dosing as below to account for IVIG.  -Patient continues to have good urine output with IV Lasix  40 mg twice daily.  We will continue this regimen while he is hospitalized and can transition to PO Lasix 40mg  once daily at discharge.  -Beta-blocker contraindicated in myasthenia gravis -BP still labile, on the low side. 112/78 this morning. Consider ACE/ARB for afterload reduction and GDMT if his BP improves or on an outpatient basis. -Wean oxygen as tolerated, encourage ambulation with PT/OT and monitoring of I&Os. Compression stockings.   #CAD Underwent heart catheterization in 2015 which showed a CTO of his mid LAD, and severe disease in his OM.  This appears to have been medically managed.  Continues to deny chest pain. -EKG shows poor R wave progression consistent with prior septal infarct. -Continue aspirin 81 mg indefinitely -Continue pravastatin 20 mg daily.  Given his age and comorbidities, we will not change this to a high  intensity statin therapy at this time.  This patient's case was discussed with Dr. Isaias Cowman and he is in agreement with this plan of care as above.   Signed: Tristan Schroeder, PA-C 10/03/2021, 11:24 AM

## 2021-10-03 NOTE — Progress Notes (Signed)
Pt ambulated around nurses desk, on room air lowest oxygen sat 86, recovered quickly, oxygen 94 on 2L.

## 2021-10-03 NOTE — Progress Notes (Signed)
Subjective: Respiratory measures are imporving, he continues to feel better   Exam: Vitals:   10/03/21 1514 10/03/21 1517  BP:  114/67  Pulse:  68  Resp: 16 19  Temp:  98.1 F (36.7 C)  SpO2: 97% 98%   Gen: In bed, NAD Resp: non-labored breathing, no acute distress. Abd: soft, nt  Neuro: MS: Awake, alert, interactive and appropriate CN: He has conjugate gaze, pupils are reactive bilaterally.  He does not have disc conjugation with sustained upgaze, nor does he have ptosis.  His palate elevates symmetrically and well.  His smile is normal and not transverse. Motor: he has good strength bilaterally without significant fatigueability.  Sensory:intact to LT  Pertinent Labs: ACHr + from 2014 P/Q type calcium channel antibodies form 2014 - positive   Impression: 88 yo M with myasthenia gravis(MG) as evidenced by positive Ach-R antibodies in 2014 and consistent symptoms.The presence of acetyl-choline receptor antibodies confirms MG.  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  He presented with an acute CHF exacerbation. Whether MG is playing a role or not has been difficult to tease out. The fact that he has been hypercarbic and the fact that he gets so much relief with bi-pap made me suspect MG is playing a role. I am hesitant to use steroids alone as these can worsen an exacerbation and his NIF/FVC are tenuous. Also, given his age and previous good control with cellcept, I feel that holding off on steroids may be prudent. He was therefore started on IVIG.   Recommendations: 1) continue treatment of underlying CHF exacerbation 2) continue home pyridostigmine and mycophenolate 3) IVIG day 3/5 4) I discussed that I typically start steroids in the setting of an acute exacerbation, but the patient is very hesitant to do so and wishes to discuss with outpatient neurology at a later date.  I think that this is reasonable  as the IVIG will likely buy him time.  Roland Rack, MD [facility] [phone]  If 7pm- 7am, please page neurology on call as listed in [place].

## 2021-10-03 NOTE — Progress Notes (Signed)
OT Cancellation Note  Patient Details Name: [name] MRN: [id number] DOB: 1926/02/11   Cancelled Treatment:    Reason Eval/Treat Not Completed: Other (comment). Pt out of the room. Noted to be walking with nursing around the unit. Will re-attempt OT tx at later date/time as pt is available.   Ardeth Perfect., MPH, MS, OTR/L ascom [phone] 10/03/21, 3:59 PM

## 2021-10-03 NOTE — Progress Notes (Addendum)
NIF-32cmH20,  FVC 1.38L

## 2021-10-03 NOTE — Progress Notes (Signed)
NIF: -30cmH20 FVC: 1.45L

## 2021-10-03 NOTE — Progress Notes (Signed)
PROGRESS NOTE   HPI was taken from Dr. Cyd Pace: 88 year old male with past medical history of systolic and diastolic congestive heart failure (Echo 12/2019 EF 30-35% wtih G3DD), myasthenia gravis, benign prostatic hyperplasia, hypertension, coronary artery disease, lymphedema, hyperlipidemia who presents to [facility] emergency department with complaints of shortness of breath.   Patient explains that approximately 3 days ago he began to develop shortness of breath.  Initially shortness of breath was mild in intensity but progressively became more more severe.  Shortness of breath is worse with exertion and improved with rest.  Patient explains that over the same period of time he has been experiencing progressively worsening bilateral lower extremity swelling.  Patient denies any associated chest pain, cough, fever, sick contacts, recent travel or contact with confirmed COVID-19 infection.   Patient symptoms continued to worsen until eventually EMS was contacted.  EMS promptly came to evaluate the patient and patient was placed on 4 L of supplemental oxygen and brought into [facility] emergency department for evaluation.   Upon evaluation in the emergency department chest x-ray was performed revealing mild pulmonary edema.  BNP was performed and was found to be elevated at 869.  Patient was felt to be suffering from acute congestive heart failure and 40 mg of intravenous Lasix was administered.  The hospitalist group was then called to assess the patient for admission to the hospital.   Hospital course from Dr. Jimmye Pace 1/1-10/01/21: Pt presented w/ shortness of breath likely secondary to CHF exacerbation. Pt initially required BiPAP and has since been weaned off. Of note, pt was placed back on BiPAP last night but it was removed this morning and pt is now back on 3L [place]. Pt will need oxygen desat test prior to d/c. Furthermore, pt has hx of myasthenia gravis and [name] syndrome and neuro believes pt's MG &  [name] Pace syndrome are playing a part in pt's current clinical picture. Neuro will start the pt on IVIG x 5 doses. For more information, please see previous progress/consult notes.     [name]  YIA:[id number] DOB: 10/08/1925 DOA: 09/28/2021 PCP: Joel Pheasant, MD    Assessment & Plan:   Principal Problem:   Acute on chronic combined systolic and diastolic CHF (congestive heart failure) ([place]) Active Problems:   Coronary artery disease involving native coronary artery of native heart without angina pectoris   Acute respiratory failure with hypoxia (HCC)   Essential hypertension   Mixed hyperlipidemia   Joel-Pace myasthenic syndrome (HCC)   Benign prostatic hyperplasia  Acute on chronic combined CHF exacerbation: w/ increasing peripheral edema, dyspnea on exertion & weight gain. Dietary indiscretions and intermittent noncompliance w/ lasix. Monitor I/Os and daily weights. Neg approx  3 L last 24 Continue on IV lasix. Cardio following and recs apprec   Acute hypoxic & hypercarbic respiratory failure: likely secondary to above. Continue on supplemental oxygen and wean as tolerated, currently on 2L [place]. Have messaged nursing about weaning  Debility: pt/ot advising hh pt/ot will need orders, also for rolling walker  Hx of CAD: denies chest pain. Continue on statin, aspirin    Myasthenia gravis & [name] syndrome: continue on home dose of pyridostigmine, cellcept. Have started IVIG x 5 doses today as per neuro (began 1/3). Neuro following and recs apprec. Likely can discharge Saturday after last dose of ivig  HLD: continue on statin   BPH: continue on home dose of flomax, proscar     DVT prophylaxis: lovenox  Code Status: full  Family Communication:  son and daughter updated @ bedside 1/5 Disposition Plan: likely d/c home w/ HH   Level of care: Telemetry Cardiac  Status is: Inpatient  Remains inpatient appropriate because: severity of illness, still requiring  supplemental oxygen & started on IVIG today     Consultants:  Neuro Cardio  Procedures:   Antimicrobials:   Subjective: Pt c/o shortness of breath and malaise, improving  Objective: Vitals:   10/03/21 1230 10/03/21 1245 10/03/21 1300 10/03/21 1315  BP: (!) 125/55 122/60 (!) 113/57 (!) 127/54  Pulse: 67 65 66 68  Resp: 16 16 16 16   Temp: 97.8 F (36.6 C) 97.8 F (36.6 C) 98.4 F (36.9 C) 97.9 F (36.6 C)  TempSrc: Oral Oral Oral Oral  SpO2: 95% 95% 98% 94%  Weight:      Height:        Intake/Output Summary (Last 24 hours) at 10/03/2021 1430 Last data filed at 10/03/2021 0950 Gross per 24 hour  Intake 830 ml  Output 1430 ml  Net -600 ml   Filed Weights   09/30/21 0500 10/02/21 0500 10/03/21 0600  Weight: 90.4 kg 91.9 kg 88.9 kg    Examination:  General exam: Appears calm but uncomfortable   Respiratory system: decreased breath sounds b/l  Cardiovascular system: S1 & S2+. No rubs or gallops  Gastrointestinal system: Abd is soft, NT, ND & hypoactive bowel sounds  Central nervous system: Alert and awake. Moves all extremities  Psychiatry: Judgement and insight appears normal. Flat mood and affect    Data Reviewed: I have personally reviewed following labs and imaging studies  CBC: Recent Labs  Lab 09/28/21 1751 09/29/21 0500 09/30/21 0555 10/01/21 0905 10/02/21 0648  WBC 6.7 6.8 6.8 6.9 4.5  NEUTROABS  --  5.3  --   --   --   HGB 13.4 13.5 11.9* 12.0* 12.4*  HCT 43.8 44.6 40.4 39.1 40.3  MCV 96.1 97.4 98.3 95.4 93.3  PLT 200 207 174 179 782   Basic Metabolic Panel: Recent Labs  Lab 09/29/21 0500 09/30/21 0555 10/01/21 0905 10/02/21 0648 10/03/21 0653  NA 137 138 136 140 140  K 4.5 4.2 3.8 3.7 3.6  CL 99 96* 94* 93* 93*  CO2 29 37* 36* 36* 37*  GLUCOSE 114* 111* 107* 105* 102*  BUN 21 22 21 22 22   CREATININE 0.99 1.13 0.89 1.00 1.06  CALCIUM 8.8* 8.3* 8.4* 8.5* 8.3*  MG 2.1  --   --   --   --    GFR: Estimated Creatinine Clearance:  48.9 mL/min (by C-G formula based on SCr of 1.06 mg/dL). Liver Function Tests: Recent Labs  Lab 09/29/21 0500  AST 23  ALT 21  ALKPHOS 73  BILITOT 1.1  PROT 6.8  ALBUMIN 3.9   No results for input(s): LIPASE, AMYLASE in the last 168 hours. No results for input(s): AMMONIA in the last 168 hours. Coagulation Profile: Recent Labs  Lab 09/29/21 1528  INR 1.1   Cardiac Enzymes: No results for input(s): CKTOTAL, CKMB, CKMBINDEX, TROPONINI in the last 168 hours. BNP (last 3 results) No results for input(s): PROBNP in the last 8760 hours. HbA1C: No results for input(s): HGBA1C in the last 72 hours. CBG: Recent Labs  Lab 09/29/21 0845  GLUCAP 108*   Lipid Profile: No results for input(s): CHOL, HDL, LDLCALC, TRIG, CHOLHDL, LDLDIRECT in the last 72 hours. Thyroid Function Tests: No results for input(s): TSH, T4TOTAL, FREET4, T3FREE, THYROIDAB in the last 72 hours.  Anemia Panel: No results  for input(s): VITAMINB12, FOLATE, FERRITIN, TIBC, IRON, RETICCTPCT in the last 72 hours. Sepsis Labs: Recent Labs  Lab 09/29/21 1204  PROCALCITON <0.10    Recent Results (from the past 240 hour(s))  Resp Panel by RT-PCR (Flu A&B, Covid) Nasopharyngeal Swab     Status: None   Collection Time: 09/28/21  5:51 PM   Specimen: Nasopharyngeal Swab; Nasopharyngeal(NP) swabs in vial transport medium  Result Value Ref Range Status   SARS Coronavirus 2 by RT PCR NEGATIVE NEGATIVE Final    Comment: (NOTE) SARS-CoV-2 target nucleic acids are NOT DETECTED.  The SARS-CoV-2 RNA is generally detectable in upper respiratory specimens during the acute phase of infection. The lowest concentration of SARS-CoV-2 viral copies this assay can detect is 138 copies/mL. A negative result does not preclude SARS-Cov-2 infection and should not be used as the sole basis for treatment or other patient management decisions. A negative result may occur with  improper specimen collection/handling, submission of  specimen other than nasopharyngeal swab, presence of viral mutation(s) within the areas targeted by this assay, and inadequate number of viral copies(<138 copies/mL). A negative result must be combined with clinical observations, patient history, and epidemiological information. The expected result is Negative.  Fact Sheet for Patients:  EntrepreneurPulse.com.au  Fact Sheet for Healthcare Providers:  IncredibleEmployment.be  This test is no t yet approved or cleared by the [place] FDA and  has been authorized for detection and/or diagnosis of SARS-CoV-2 by FDA under an Emergency Use Authorization (EUA). This EUA will remain  in effect (meaning this test can be used) for the duration of the COVID-19 declaration under Section 564(b)(1) of the Act, 21 U.S.C.section 360bbb-3(b)(1), unless the authorization is terminated  or revoked sooner.       Influenza A by PCR NEGATIVE NEGATIVE Final   Influenza B by PCR NEGATIVE NEGATIVE Final    Comment: (NOTE) The Xpert Xpress SARS-CoV-2/FLU/RSV plus assay is intended as an aid in the diagnosis of influenza from Nasopharyngeal swab specimens and should not be used as a sole basis for treatment. Nasal washings and aspirates are unacceptable for Xpert Xpress SARS-CoV-2/FLU/RSV testing.  Fact Sheet for Patients: EntrepreneurPulse.com.au  Fact Sheet for Healthcare Providers: IncredibleEmployment.be  This test is not yet approved or cleared by the [place] FDA and has been authorized for detection and/or diagnosis of SARS-CoV-2 by FDA under an Emergency Use Authorization (EUA). This EUA will remain in effect (meaning this test can be used) for the duration of the COVID-19 declaration under Section 564(b)(1) of the Act, 21 U.S.C. section 360bbb-3(b)(1), unless the authorization is terminated or revoked.  Performed at [facility], [address] [place], [place] [address]          Radiology Studies: No results found.      Scheduled Meds:  aspirin EC  81 mg Oral Daily   Chlorhexidine Gluconate Cloth  6 each Topical Daily   enoxaparin (LOVENOX) injection  40 mg Subcutaneous Q24H   finasteride  5 mg Oral Daily   furosemide  40 mg Intravenous BID   mycophenolate  1,000 mg Oral BID   polyvinyl alcohol  1 drop Both Eyes Daily   pravastatin  20 mg Oral q1800   prednisoLONE acetate  1 drop Right Eye Daily   pyridostigmine  30 mg Oral 6 X Daily   tamsulosin  0.4 mg Oral Daily   Continuous Infusions:  [START ON 10/04/2021] Immune Globulin 10%     [START ON 10/05/2021] Immune Globulin 10%  LOS: 5 days    Time spent: 30 mins     Desma Maxim, MD [facility]  If 7PM-7AM, please contact night-coverage 10/03/2021, 2:30 PM

## 2021-10-04 LAB — BASIC METABOLIC PANEL
Anion gap: 8 (ref 5–15)
BUN: 31 mg/dL — ABNORMAL HIGH (ref 8–23)
CO2: 37 mmol/L — ABNORMAL HIGH (ref 22–32)
Calcium: 8.6 mg/dL — ABNORMAL LOW (ref 8.9–10.3)
Chloride: 94 mmol/L — ABNORMAL LOW (ref 98–111)
Creatinine, Ser: 1.02 mg/dL (ref 0.61–1.24)
GFR, Estimated: >60 mL/min (ref >60)
Glucose, Bld: 102 mg/dL — ABNORMAL HIGH (ref 70–99)
Potassium: 4.1 mmol/L (ref 3.5–5.1)
Sodium: 139 mmol/L (ref 135–145)

## 2021-10-04 LAB — MISC LABCORP TEST (SEND OUT): Labcorp test code: 140640

## 2021-10-04 NOTE — Plan of Care (Signed)

## 2021-10-04 NOTE — Progress Notes (Signed)
NIF: -30cmH2O FVC: 1.76L

## 2021-10-04 NOTE — Progress Notes (Signed)
PROGRESS NOTE   HPI was taken from Dr. Cyd Silence: 88 year old male with past medical history of systolic and diastolic congestive heart failure (Echo 12/2019 EF 30-35% wtih G3DD), myasthenia gravis, benign prostatic hyperplasia, hypertension, coronary artery disease, lymphedema, hyperlipidemia who presents to [facility] emergency department with complaints of shortness of breath.   Patient explains that approximately 3 days ago he began to develop shortness of breath.  Initially shortness of breath was mild in intensity but progressively became more more severe.  Shortness of breath is worse with exertion and improved with rest.  Patient explains that over the same period of time he has been experiencing progressively worsening bilateral lower extremity swelling.  Patient denies any associated chest pain, cough, fever, sick contacts, recent travel or contact with confirmed COVID-19 infection.   Patient symptoms continued to worsen until eventually EMS was contacted.  EMS promptly came to evaluate the patient and patient was placed on 4 L of supplemental oxygen and brought into [facility] emergency department for evaluation.   Upon evaluation in the emergency department chest x-ray was performed revealing mild pulmonary edema.  BNP was performed and was found to be elevated at 869.  Patient was felt to be suffering from acute congestive heart failure and 40 mg of intravenous Lasix was administered.  The hospitalist group was then called to assess the patient for admission to the hospital.   Hospital course from Dr. Jimmye Norman 1/1-10/01/21: Pt presented w/ shortness of breath likely secondary to CHF exacerbation. Pt initially required BiPAP and has since been weaned off. Of note, pt was placed back on BiPAP last night but it was removed this morning and pt is now back on 3L [place]. Pt will need oxygen desat test prior to d/c. Furthermore, pt has hx of myasthenia gravis and Lambert eaton syndrome and neuro believes pt's MG &  [name] eaton syndrome are playing a part in pt's current clinical picture. Neuro will start the pt on IVIG x 5 doses. For more information, please see previous progress/consult notes.     OTHER ATIENZA  XTK:[id number] DOB: Oct 20, 1925 DOA: 09/28/2021 PCP: [name], MD    Assessment & Plan:   Principal Problem:   Acute on chronic combined systolic and diastolic CHF (congestive heart failure) ([place]) Active Problems:   Coronary artery disease involving native coronary artery of native heart without angina pectoris   Acute respiratory failure with hypoxia (HCC)   Essential hypertension   Mixed hyperlipidemia   Lambert-Eaton myasthenic syndrome (HCC)   Benign prostatic hyperplasia  Acute on chronic combined CHF exacerbation: w/ increasing peripheral edema, dyspnea on exertion & weight gain. Dietary indiscretions and intermittent noncompliance w/ lasix. Monitor I/Os and daily weights.  Continue on IV lasix. Cardio following and recs apprec. Will transition to home lasix at discharge  Acute hypoxic & hypercarbic respiratory failure: likely secondary to above. Continue on supplemental oxygen and wean as tolerated, currently on 2L [place]. Ordering home o2  Debility: pt/ot advising hh pt/ot will need orders, also for rolling walker. Ordered today  Hx of CAD: denies chest pain. Continue on statin, aspirin    Myasthenia gravis & [name]: continue on home dose of pyridostigmine, cellcept. Have started IVIG x 5 doses today as per neuro (began 1/3). Neuro following and recs apprec. Likely can discharge Saturday after last dose of ivig  HLD: continue on statin   BPH: continue on home dose of flomax, proscar     DVT prophylaxis: lovenox  Code Status: full  Family Communication:  son and daughter updated @ bedside 1/6 Disposition Plan: likely d/c home w/ HH   Level of care: Telemetry Cardiac  Status is: Inpatient  Remains inpatient appropriate because: severity of illness,  still requiring supplemental oxygen & started on IVIG today     Consultants:  Neuro Cardio  Procedures:   Antimicrobials:   Subjective: Pt c/o shortness of breath and malaise, improving  Objective: Vitals:   10/04/21 1059 10/04/21 1125 10/04/21 1135 10/04/21 1349  BP: (!) 146/64 (!) 122/57 119/70 123/78  Pulse: 69 75 63 70  Resp:  20 18 16   Temp: 97.6 F (36.4 C) 97.7 F (36.5 C) 97.7 F (36.5 C)   TempSrc: Oral Oral Oral   SpO2: 97% 96% 96% 96%  Weight:      Height:        Intake/Output Summary (Last 24 hours) at 10/04/2021 1549 Last data filed at 10/04/2021 1340 Gross per 24 hour  Intake 890 ml  Output 900 ml  Net -10 ml   Filed Weights   10/02/21 0500 10/03/21 0600 10/04/21 0500  Weight: 91.9 kg 88.9 kg 89 kg    Examination:  General exam: Appears calm but uncomfortable   Respiratory system: bibasilar rales otherwise clear Cardiovascular system: S1 & S2+. No rubs or gallops  Gastrointestinal system: Abd is soft, NT, ND & hypoactive bowel sounds  Central nervous system: Alert and awake. Moves all extremities  Psychiatry: Judgement and insight appears normal. Flat mood and affect    Data Reviewed: I have personally reviewed following labs and imaging studies  CBC: Recent Labs  Lab 09/28/21 1751 09/29/21 0500 09/30/21 0555 10/01/21 0905 10/02/21 0648  WBC 6.7 6.8 6.8 6.9 4.5  NEUTROABS  --  5.3  --   --   --   HGB 13.4 13.5 11.9* 12.0* 12.4*  HCT 43.8 44.6 40.4 39.1 40.3  MCV 96.1 97.4 98.3 95.4 93.3  PLT 200 207 174 179 620   Basic Metabolic Panel: Recent Labs  Lab 09/29/21 0500 09/30/21 0555 10/01/21 0905 10/02/21 0648 10/03/21 0653 10/04/21 0516  NA 137 138 136 140 140 139  K 4.5 4.2 3.8 3.7 3.6 4.1  CL 99 96* 94* 93* 93* 94*  CO2 29 37* 36* 36* 37* 37*  GLUCOSE 114* 111* 107* 105* 102* 102*  BUN 21 22 21 22 22  31*  CREATININE 0.99 1.13 0.89 1.00 1.06 1.02  CALCIUM 8.8* 8.3* 8.4* 8.5* 8.3* 8.6*  MG 2.1  --   --   --   --   --     GFR: Estimated Creatinine Clearance: 50.8 mL/min (by C-G formula based on SCr of 1.02 mg/dL). Liver Function Tests: Recent Labs  Lab 09/29/21 0500  AST 23  ALT 21  ALKPHOS 73  BILITOT 1.1  PROT 6.8  ALBUMIN 3.9   No results for input(s): LIPASE, AMYLASE in the last 168 hours. No results for input(s): AMMONIA in the last 168 hours. Coagulation Profile: Recent Labs  Lab 09/29/21 1528  INR 1.1   Cardiac Enzymes: No results for input(s): CKTOTAL, CKMB, CKMBINDEX, TROPONINI in the last 168 hours. BNP (last 3 results) No results for input(s): PROBNP in the last 8760 hours. HbA1C: No results for input(s): HGBA1C in the last 72 hours. CBG: Recent Labs  Lab 09/29/21 0845  GLUCAP 108*   Lipid Profile: No results for input(s): CHOL, HDL, LDLCALC, TRIG, CHOLHDL, LDLDIRECT in the last 72 hours. Thyroid Function Tests: No results for input(s): TSH, T4TOTAL, FREET4, T3FREE, THYROIDAB in the  last 72 hours.  Anemia Panel: No results for input(s): VITAMINB12, FOLATE, FERRITIN, TIBC, IRON, RETICCTPCT in the last 72 hours. Sepsis Labs: Recent Labs  Lab 09/29/21 1204  PROCALCITON <0.10    Recent Results (from the past 240 hour(s))  Resp Panel by RT-PCR (Flu A&B, Covid) Nasopharyngeal Swab     Status: None   Collection Time: 09/28/21  5:51 PM   Specimen: Nasopharyngeal Swab; Nasopharyngeal(NP) swabs in vial transport medium  Result Value Ref Range Status   SARS Coronavirus 2 by RT PCR NEGATIVE NEGATIVE Final    Comment: (NOTE) SARS-CoV-2 target nucleic acids are NOT DETECTED.  The SARS-CoV-2 RNA is generally detectable in upper respiratory specimens during the acute phase of infection. The lowest concentration of SARS-CoV-2 viral copies this assay can detect is 138 copies/mL. A negative result does not preclude SARS-Cov-2 infection and should not be used as the sole basis for treatment or other patient management decisions. A negative result may occur with  improper  specimen collection/handling, submission of specimen other than nasopharyngeal swab, presence of viral mutation(s) within the areas targeted by this assay, and inadequate number of viral copies(<138 copies/mL). A negative result must be combined with clinical observations, patient history, and epidemiological information. The expected result is Negative.  Fact Sheet for Patients:  EntrepreneurPulse.com.au  Fact Sheet for Healthcare Providers:  IncredibleEmployment.be  This test is no t yet approved or cleared by the [place] FDA and  has been authorized for detection and/or diagnosis of SARS-CoV-2 by FDA under an Emergency Use Authorization (EUA). This EUA will remain  in effect (meaning this test can be used) for the duration of the COVID-19 declaration under Section 564(b)(1) of the Act, 21 U.S.C.section 360bbb-3(b)(1), unless the authorization is terminated  or revoked sooner.       Influenza A by PCR NEGATIVE NEGATIVE Final   Influenza B by PCR NEGATIVE NEGATIVE Final    Comment: (NOTE) The Xpert Xpress SARS-CoV-2/FLU/RSV plus assay is intended as an aid in the diagnosis of influenza from Nasopharyngeal swab specimens and should not be used as a sole basis for treatment. Nasal washings and aspirates are unacceptable for Xpert Xpress SARS-CoV-2/FLU/RSV testing.  Fact Sheet for Patients: EntrepreneurPulse.com.au  Fact Sheet for Healthcare Providers: IncredibleEmployment.be  This test is not yet approved or cleared by the [place] FDA and has been authorized for detection and/or diagnosis of SARS-CoV-2 by FDA under an Emergency Use Authorization (EUA). This EUA will remain in effect (meaning this test can be used) for the duration of the COVID-19 declaration under Section 564(b)(1) of the Act, 21 U.S.C. section 360bbb-3(b)(1), unless the authorization is terminated or revoked.  Performed at  [facility], [address], [place] , [place] [address]          Radiology Studies: No results found.      Scheduled Meds:  aspirin EC  81 mg Oral Daily   Chlorhexidine Gluconate Cloth  6 each Topical Daily   enoxaparin (LOVENOX) injection  40 mg Subcutaneous Q24H   finasteride  5 mg Oral Daily   furosemide  40 mg Intravenous BID   mycophenolate  1,000 mg Oral BID   polyvinyl alcohol  1 drop Both Eyes Daily   pravastatin  20 mg Oral q1800   prednisoLONE acetate  1 drop Right Eye Daily   pyridostigmine  30 mg Oral 6 X Daily   tamsulosin  0.4 mg Oral Daily   Continuous Infusions:  [START ON 10/05/2021] Immune Globulin 10%  LOS: 6 days    Time spent: 30 mins     Desma Maxim, MD [facility]  If 7PM-7AM, please contact night-coverage 10/04/2021, 3:49 PM

## 2021-10-04 NOTE — Progress Notes (Addendum)
Mobility Specialist - Progress Note   10/04/21 1700  Mobility  Activity Ambulated in hall  Level of Assistance Standby assist, set-up cues, supervision of patient - no hands on  Assistive Device Front wheel walker  Distance Ambulated (ft) 200 ft  Mobility Ambulated with assistance in hallway  Mobility Response Tolerated well  Mobility performed by Mobility specialist  $Mobility charge 1 Mobility    Pt sitting EOB on arrival, utilizing 2L. Pt ambulated in hallway with supervision. O2 desat to 84%, possibly d/t tight grip, bumped up to 3L for recovery. Pt denied SOB. Returned EOB with needs in reach. Family at bedside. Back on 2L with sats in 90s.    Kathee Delton Mobility Specialist 10/04/21, 5:26 PM

## 2021-10-04 NOTE — Progress Notes (Signed)
OT Cancellation Note  Patient Details Name: [name] MRN: [id number] DOB: June 06, 1926   Cancelled Treatment:    Reason Eval/Treat Not Completed: Patient declined, no reason specified. Pt seated EOB visiting with family. Pt reports recently having worked with PT. Denies SOB. Pt/family politely declining OT this afternoon. Will re-attempt at later date/time as agreeable and appropriate.   Ardeth Perfect., MPH, MS, OTR/L ascom [phone] 10/04/21, 4:01 PM

## 2021-10-04 NOTE — Care Management Important Message (Signed)
Important Message  Patient Details  Name: [name] MRN: [id number] Date of Birth: [birth year]   Medicare Important Message Given:  Yes     Dannette Barbara 10/04/2021, 12:46 PM

## 2021-10-04 NOTE — Progress Notes (Signed)
[place] NOTE  Patient ID: [name] MRN: [id number] DOB/AGE: [birth year] 88 y.o.  Admit date: 09/28/2021 Referring Physician [name] Primary Physician Joel Pheasant, MD Primary Cardiologist [name]  Reason for Consultation AoCHF  HPI:  [name] is a 88 year old male with a history of HFrEF (EF 30%), mild aortic stenosis, CAD (CTO LAD, moderate to severe disease in OM), moderate pulmonary hypertension, hypertension, myasthenia gravis who was admitted to the hospital with shortness of breath.  Cardiology is consulted for management of acute on chronic congestive heart failure.  Interval history -Patient states he is feeling even better than yesterday, denies chest pain, shortness of breath. -Patient continues to report good urine output with 40 IV Lasix twice daily, however there is only 370 L of net output recorded yesterday.  Appears euvolemic. -Today is day 4/5 of IVIG  Review of systems complete and found to be negative unless listed above   Past Medical History:  Diagnosis Date   Abnormal chest CT 03/28/2017   Allergy    Anemia    Angina pectoris ([place]) 04/02/2011   Aortic aneurysm ([place]) 03/28/2017   Saw Dr Genevive Bi 04/16/17 - recommended f/u CT in 3 months.     Atherosclerosis of both carotid arteries 07/17/2014   Benign lipomatous neoplasm of skin and subcutaneous tissue of left arm 10/04/2013   Bilateral carotid artery stenosis 08/30/2014   Overview:  Less than 50% 2015   Bladder cancer ([place]) 03/05/2013   CAD (coronary artery disease) 06/18/2014   Cancer ([place]) 11-24-12   bladder. BCG treatments by Dr Jacqlyn Larsen.   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 WD SCC. Re-shaved 01/14/2016. Excised 02/05/2016, margins free.   Squamous cell carcinoma of skin 11/03/2018   Right cheek ant. to sideburn. Poorly differentiated.   Squamous cell carcinoma of skin 02/02/2019   Right mid dorsum forearm. WD SCC. EDC.    Past Surgical History:  Procedure Laterality Date   [place]    Medications Prior to Admission  Medication Sig Dispense Refill Last Dose   aspirin 81 MG tablet Take 81 mg by mouth daily.   09/28/2021   finasteride (PROSCAR) 5 MG tablet Take 5 mg by mouth daily.   09/28/2021   hydroxypropyl methylcellulose / hypromellose (ISOPTO TEARS / GONIOVISC) 2.5 % ophthalmic solution Place 1 drop into both eyes daily.   09/28/2021   mycophenolate (CELLCEPT) 250 MG capsule Take 1,000 mg by mouth 2 (two) times daily.    09/28/2021   pravastatin (PRAVACHOL) 20 MG tablet Take 1 tablet (20 mg total) by mouth daily. 90 tablet 1 09/28/2021   prednisoLONE acetate (  PRED FORTE) 1 % ophthalmic suspension Place 1 drop into the right eye daily. Per patient. 5 mL 0 09/28/2021   pyridostigmine (MESTINON) 60 MG tablet Take 30 mg by mouth 6 (six) times daily.    09/28/2021   tamsulosin (FLOMAX) 0.4 MG CAPS Take 0.4 mg by mouth daily.   09/28/2021   furosemide (LASIX) 40 MG tablet Take 1 tablet (40 mg total) by mouth daily. 90 tablet 3    tacrolimus (PROTOPIC) 0.1 % ointment Apply topically 2 (two) times daily. 100 g 2     Social History    Socioeconomic History   Marital status: Married    Spouse name: Not on file   Number of children: Not on file   Years of education: Not on file   Highest education level: Not on file  Occupational History   Not on file  Tobacco Use   Smoking status: Former    Types: Cigarettes    Quit date: 09/29/1958    Years since quitting: 63.0   Smokeless tobacco: Never  Vaping Use   Vaping Use: Never used  Substance and Sexual Activity   Alcohol use: Yes    Alcohol/week: 0.0 standard drinks    Comment: occasionally   Drug use: No   Sexual activity: Never  Other Topics Concern   Not on file  Social History Narrative   Pt is married with 2 children - 1 son, 1 daughter. Previously self-employed in [place] Strain: Low Risk    Difficulty of Paying Living Expenses: Not hard at all  Food Insecurity: No Food Insecurity   Worried About Charity fundraiser in the Last Year: Never true   Arboriculturist in the Last Year: Never true  Transportation Needs: No Transportation Needs   Lack of Transportation (Medical): No   Lack of Transportation (Non-Medical): No  Physical Activity: Not on file  Stress: No Stress Concern Present   Feeling of Stress : Not at all  Social Connections: Unknown   Frequency of Communication with Friends and Family: More than three times a week   Frequency of Social Gatherings with Friends and Family: More than three times a week   Attends Religious Services: Not on file   Active Member of Clubs or Organizations: Not on file   Attends Archivist Meetings: Not on file   Marital Status: Widowed  Human resources officer Violence: Not At Risk   Fear of Current or Ex-Partner: No   Emotionally Abused: No   Physically Abused: No   Sexually Abused: No    Family History  Problem Relation Age of Onset   Lung cancer Sister    Prostate cancer Brother    Lung cancer Brother    Arthritis Other        parent    Colon cancer Neg Hx       Review of systems complete and found to be negative unless listed above   PHYSICAL EXAM General: Elderly appearing Caucasian male examined sitting on edge of PCU bed . HEENT:  Normocephalic and atramatic Neck:  No JVD Lungs: Normal respiratory effort on O2 by nasal cannula. Clear to ascultation bilaterally. Heart: HRRR, frequent extra beats. 2/6 systolic murmur  Abdomen: Nondistended appearing Msk: Normal strength and tone for age. Extremities: Bilateral lower extremity edema left greater > right.  Taking a break from compression stockings overnight, chronic venous stasis changes bilaterally. Neuro: Alert and oriented x3  no focal deficit Psych:  Responds appropriately  Labs:   Lab Results  Component Value Date   WBC 4.5 10/02/2021   HGB 12.4 (L) 10/02/2021   HCT 40.3 10/02/2021   MCV 93.3 10/02/2021   PLT 184 10/02/2021    Recent Labs  Lab 09/29/21 0500 09/30/21 0555 10/04/21 0516  NA 137   < > 139  K 4.5   < > 4.1  CL 99   < > 94*  CO2 29   < > 37*  BUN 21   < > 31*  CREATININE 0.99   < > 1.02  CALCIUM 8.8*   < > 8.6*  PROT 6.8  --   --   BILITOT 1.1  --   --   ALKPHOS 73  --   --   ALT 21  --   --   AST 23  --   --   GLUCOSE 114*   < > 102*   < > = values in this interval not displayed.    Lab Results  Component Value Date   TROPONINI 0.43 [facility]) 12/29/2016     Lab Results  Component Value Date   CHOL 140 02/22/2021   CHOL 159 11/15/2020   CHOL 155 07/16/2020   Lab Results  Component Value Date   HDL 44.70 02/22/2021   HDL 57.20 11/15/2020   HDL 53.80 07/16/2020   Lab Results  Component Value Date   LDLCALC 71 02/22/2021   LDLCALC 87 11/15/2020   LDLCALC 86 07/16/2020   Lab Results  Component Value Date   TRIG 121.0 02/22/2021   TRIG 74.0 11/15/2020   TRIG 77.0 07/16/2020   Lab Results  Component Value Date   CHOLHDL 3 02/22/2021   CHOLHDL 3 11/15/2020   CHOLHDL 3 07/16/2020   Lab Results  Component  Value Date   LDLDIRECT 143.5 08/26/2013      Radiology: DG Chest 1 View  Result Date: 09/29/2021 CLINICAL DATA:  Shortness of breath. Patient has a history of congestive heart failure. EXAM: CHEST  1 VIEW COMPARISON:  September 28, 2021 5:44 p.m. FINDINGS: The heart size is enlarged. Mediastinal contour is stable. There is increased pulmonary interstitium bilaterally worsened compared prior exam. Probable small left pleural effusion is identified. Osseous structures are stable. IMPRESSION: Congestive heart failure worsened compared prior exam. Electronically Signed   By: Abelardo Diesel M.D.   On: 09/29/2021 09:27   DG Chest 2 View  Result Date: 09/28/2021 CLINICAL DATA:  Short of breath for the past 3 days. EXAM: CHEST - 2 VIEW COMPARISON:  01/13/2020. FINDINGS: Cardiac silhouette mildly enlarged.  No mediastinal or hilar masses. Bilateral interstitial thickening. Additional opacities noted in the left lung base, likely atelectasis. No pleural effusion.  No pneumothorax. Skeletal structures are demineralized, but grossly intact. IMPRESSION: 1. Cardiomegaly with interstitial thickening. Suspect mild congestive heart failure. Electronically Signed   By: Lajean Manes M.D.   On: 09/28/2021 18:22   ECHOCARDIOGRAM COMPLETE  Result Date: 09/30/2021    ECHOCARDIOGRAM REPORT   Patient Name:   [name] Date of Exam: 09/30/2021 Medical Rec #:  [id number]       Height:       70.0 in Accession #:    [id number]      Weight:       199.3 lb Date of Birth:  08-27-1926       BSA:          2.084 m Patient Age:  89 years        BP:           124/64 mmHg Patient Gender: M               HR:           66 bpm. Exam Location:  [facility] Procedure: 2D Echo, Cardiac Doppler and Color Doppler Indications:     CHF-acute systolic A41.66  History:         Patient has prior history of Echocardiogram examinations, most                  recent 01/15/2020. CAD; Risk Factors:Hypertension.  Sonographer:     Sherrie Sport Referring Phys:   [id number] [place] Diagnosing Phys: Donnelly Angelica  Sonographer Comments: Suboptimal apical window. IMPRESSIONS  1. Left ventricular ejection fraction, by estimation, is 30 to 35%. The left ventricle has moderately decreased function. The left ventricle demonstrates regional wall motion abnormalities (see scoring diagram/findings for description). Left ventricular  diastolic parameters are consistent with Grade II diastolic dysfunction (pseudonormalization).  2. Right ventricular systolic function is normal. The right ventricular size is not well visualized.  3. Left atrial size was severely dilated.  4. The mitral valve is normal in structure. Mild mitral valve regurgitation. No evidence of mitral stenosis.  5. The aortic valve is calcified. Aortic valve regurgitation is not visualized. Aortic valve sclerosis/calcification is present, without any evidence of aortic stenosis. Comparison(s): No significant change from prior study. FINDINGS  Left Ventricle: Left ventricular ejection fraction, by estimation, is 30 to 35%. The left ventricle has moderately decreased function. The left ventricle demonstrates regional wall motion abnormalities. Severe hypokinesis of the left ventricular, entire  apical segment and anteroseptal wall. The left ventricular internal cavity size was normal in size. There is no left ventricular hypertrophy. Left ventricular diastolic parameters are consistent with Grade II diastolic dysfunction (pseudonormalization). Right Ventricle: The right ventricular size is not well visualized. Right vetricular wall thickness was not well visualized. Right ventricular systolic function is normal. Left Atrium: Left atrial size was severely dilated. Right Atrium: Right atrial size was normal in size. Pericardium: There is no evidence of pericardial effusion. Mitral Valve: The mitral valve is normal in structure. Mild mitral valve regurgitation. No evidence of mitral valve stenosis. MV peak gradient, 6.1  mmHg. The mean mitral valve gradient is 2.0 mmHg. Tricuspid Valve: The tricuspid valve is not well visualized. Tricuspid valve regurgitation is trivial. Aortic Valve: The aortic valve is calcified. Aortic valve regurgitation is not visualized. Aortic valve sclerosis/calcification is present, without any evidence of aortic stenosis. Aortic valve mean gradient measures 3.0 mmHg. Aortic valve peak gradient measures 5.3 mmHg. Aortic valve area, by VTI measures 1.69 cm. Pulmonic Valve: The pulmonic valve was not well visualized. Pulmonic valve regurgitation is mild. No evidence of pulmonic stenosis. Aorta: The aortic root and ascending aorta are structurally normal, with no evidence of dilitation. IAS/Shunts: The interatrial septum was not well visualized.  LEFT VENTRICLE PLAX 2D LVIDd:         5.00 cm      Diastology LVIDs:         4.10 cm      LV e' medial:    4.03 cm/s LV PW:         1.30 cm      LV E/e' medial:  21.3 LV IVS:        1.50 cm      LV e' lateral:  8.59 cm/s LVOT diam:     2.00 cm      LV E/e' lateral: 10.0 LV SV:         36 LV SV Index:   17 LVOT Area:     3.14 cm  LV Volumes (MOD) LV vol d, MOD A2C: 178.0 ml LV vol d, MOD A4C: 207.0 ml LV vol s, MOD A2C: 165.0 ml LV vol s, MOD A4C: 96.2 ml LV SV MOD A2C:     13.0 ml LV SV MOD A4C:     207.0 ml LV SV MOD BP:      57.3 ml RIGHT VENTRICLE RV S prime:     18.00 cm/s TAPSE (M-mode): 4.8 cm LEFT ATRIUM              Index        RIGHT ATRIUM           Index LA diam:        5.30 cm  2.54 cm/m   RA Area:     17.30 cm LA Vol (A2C):   101.0 ml 48.46 ml/m  RA Volume:   48.50 ml  23.27 ml/m LA Vol (A4C):   127.0 ml 60.93 ml/m LA Biplane Vol: 118.0 ml 56.62 ml/m  AORTIC VALVE                    PULMONIC VALVE AV Area (Vmax):    1.30 cm     PV Vmax:        0.78 m/s AV Area (Vmean):   1.29 cm     PV Vmean:       53.600 cm/s AV Area (VTI):     1.69 cm     PV VTI:         0.152 m AV Vmax:           114.67 cm/s  PV Peak grad:   2.4 mmHg AV Vmean:           76.567 cm/s  PV Mean grad:   1.5 mmHg AV VTI:            0.214 m      RVOT Peak grad: 4 mmHg AV Peak Grad:      5.3 mmHg AV Mean Grad:      3.0 mmHg LVOT Vmax:         47.60 cm/s LVOT Vmean:        31.500 cm/s LVOT VTI:          0.115 m LVOT/AV VTI ratio: 0.54  AORTA Ao Root diam: 2.93 cm MITRAL VALVE               TRICUSPID VALVE MV Area (PHT): 4.80 cm    TR Peak grad:   27.2 mmHg MV Area VTI:   1.02 cm    TR Vmax:        261.00 cm/s MV Peak grad:  6.1 mmHg MV Mean grad:  2.0 mmHg    SHUNTS MV Vmax:       1.23 m/s    Systemic VTI:  0.12 m MV Vmean:      61.9 cm/s   Systemic Diam: 2.00 cm MV Decel Time: 158 msec    Pulmonic VTI:  0.217 m MV E velocity: 85.70 cm/s MV A velocity: 99.40 cm/s MV E/A ratio:  0.86 Donnelly Angelica Electronically signed by Donnelly Angelica Signature Date/Time: 09/30/2021/12:59:07 PM    Final    CT HEAD CODE STROKE WO  CONTRAST  Result Date: 09/29/2021 CLINICAL DATA:  Code stroke. EXAM: CT HEAD WITHOUT CONTRAST TECHNIQUE: Contiguous axial images were obtained from the base of the skull through the vertex without intravenous contrast. COMPARISON:  None. FINDINGS: Brain: No evidence of acute infarction, hemorrhage, hydrocephalus, extra-axial collection or mass lesion/mass effect. Vascular: No hyperdense vessel or unexpected calcification. Skull: Normal. Negative for fracture or focal lesion. Sinuses/Orbits: No acute finding Other: These results were communicated to Dr. Rory Percy at 8:30 am on 09/29/2021 by text page via the [facility] messaging system. ASPECTS [facility] Stroke Program Early CT Score) Not scored without localizing symptom IMPRESSION: Motion degraded head CT without acute finding. Electronically Signed   By: Jorje Guild M.D.   On: 09/29/2021 08:31   CT ANGIO HEAD NECK W WO CM W PERF (CODE STROKE)  Result Date: 09/29/2021 CLINICAL DATA:  Acute stroke suspected EXAM: CT ANGIOGRAPHY HEAD AND NECK TECHNIQUE: Multidetector CT imaging of the head and neck was performed using the standard protocol  during bolus administration of intravenous contrast. Multiplanar CT image reconstructions and MIPs were obtained to evaluate the vascular anatomy. Carotid stenosis measurements (when applicable) are obtained utilizing NASCET criteria, using the distal internal carotid diameter as the denominator. CONTRAST:  Dose is not known on this in progress study COMPARISON:  Noncontrast head CT earlier the same day FINDINGS: CTA NECK FINDINGS Aortic arch: Elongated and atheromatous aorta. Right carotid system: Diffuse atheromatous plaque. Mixed density, mainly calcified plaque at the bifurcation and bulb with proximal ICA stenosis measuring up to 50%. Left carotid system: Atheromatous wall thickening. Calcified plaque at the proximal ICA without associated stenosis. Subsequently is a vessel kink which narrows the lumen to approximately 55%. No ulceration or dissection. Vertebral arteries: Proximal subclavian atherosclerosis and tortuosity without flow limiting stenosis. The right vertebral artery is dominant. Calcified plaque at the right vertebral origin causing 50% stenosis based on sagittal reformats. No dissection or beading. Skeleton: No acute or aggressive finding. Other neck: Negative Upper chest: Small pleural effusions. Airway cuffing and interlobular septal thickening. Review of the MIP images confirms the above findings CTA HEAD FINDINGS Anterior circulation: Extensive atheromatous plaque along the carotid siphons. No significant stenosis, proximal occlusion, aneurysm, or vascular malformation. Posterior circulation: No significant stenosis, proximal occlusion, aneurysm, or vascular malformation. Venous sinuses: Diffusely patent Anatomic variants: None significant Review of the MIP images confirms the above findings IMPRESSION: 1. No emergent finding. 2. Atherosclerosis with ~50% narrowing at the bilateral proximal ICA and right vertebral origin. 3. Probable CHF. Electronically Signed   By: Jorje Guild M.D.   On:  09/29/2021 08:56    EKG: Sinus rhythm with frequent PVCs.  Poor R wave progression.  Lateral T wave abnormality.  ASSESSMENT AND PLAN:  [name] is a 88 year old male with a history of HFrEF (EF 30%), mild aortic stenosis, CAD (CTO LAD, moderate to severe disease in OM), moderate pulmonary hypertension, hypertension, myasthenia gravis who was admitted to the hospital with shortness of breath.  Cardiology is consulted for management of acute on chronic congestive heart failure. Has improved with diuresis but may also have exacerbation of MG playing a role.   # Heart failure with reduced EF (LVEF 30%) #Acute hypoxic and hypercapnic respiratory failure # Myasthenia gravis EF previously estimated at 30%, with moderate pulmonary hypertension, and mild aortic stenosis.  He has been intermittently compliant recently with his Lasix, and in addition has been enjoying holiday meals with significant increase in salt intake as a result. He has improved some with diuresis.  -  Patient is on day 4/5 of IVIG and continues to improve clinically. We will continue to monitor his volume status and continue lasix dosing as below to account for IVIG.  -Beta-blocker contraindicated in myasthenia gravis -BP still labile, on the low side. Consider ACE/ARB for afterload reduction and GDMT on an outpatient basis. -Wean oxygen as tolerated, encourage ambulation with PT/OT and monitoring of I&Os. Compression stockings.  -Provided patient continues to ambulate well and feel overall back to his baseline with administration of IVIG, he should be cleared for discharge from a cardiac standpoint. Recommend continuing IV lasix 40mg  BID while getting IVIG and transition to Lasix 40 mg by mouth once daily at discharge with close follow-up with his regular cardiologist Dr. Nehemiah Massed in 1 to 2 weeks.  #CAD Underwent heart catheterization in 2015 which showed a CTO of his mid LAD, and severe disease in his OM.  This appears to have  been medically managed.  Continues to deny chest pain. -EKG shows poor R wave progression consistent with prior septal infarct. -Continue aspirin 81 mg indefinitely -Continue pravastatin 20 mg daily.  Given his age and comorbidities, we will not change this to a high intensity statin therapy at this time.  This patient's case was discussed with Dr. Isaias Cowman and he is in agreement with this plan of care as above.  Cardiology signing off, please contact on-call physician Dr. Isaias Cowman for further questions over the weekend.  Signed: Tristan Schroeder, PA-C 10/04/2021, 9:27 AM

## 2021-10-04 NOTE — Progress Notes (Signed)
NIF:  -30 cmH20 FVC: 1.58L

## 2021-10-04 NOTE — Progress Notes (Signed)
SATURATION QUALIFICATIONS: (This note is used to comply with regulatory documentation for home oxygen)  Patient Saturations on Room Air at Rest = 94%  Patient Saturations on Room Air while Ambulating = 84%  Patient Saturations on 2 Liters of oxygen while Ambulating = 94%  Please briefly explain why patient needs home oxygen: Patient desaturates to 80's without oxygen on exertion and walking.

## 2021-10-04 NOTE — Progress Notes (Signed)
Physical Therapy Treatment Patient Details Name: [name] MRN: [id number] DOB: 06-30-1926 Today's Date: 10/04/2021   History of Present Illness 88 year old male with a history of HFrEF (EF 30%), mild aortic stenosis, CAD (CTO LAD, moderate to severe disease in OM), moderate pulmonary hypertension, hypertension, myasthenia gravis who was admitted to the hospital with shortness of breath. During hospital cource was noted to be less responsive and nonverbal with a code stroke initiated. Imaging unremarkable.    PT Comments    Pt was sitting EOB with daughter and supportive son present. He is A and O x 4 and cooperative throughout. He was on 2 L o2 upon arriving with sao2 94%. Removed O2 during session however during ambulation desaturates to 83%. Reapplied 2 L and pt able to maintain > 90%. He was able to stand and ambulate with supervision only. No physical assistance required. Pt was only using SPC at times at baseline. Author recommends HHPT to assist pt to PLOF and home O2 for equipment. He has all other equipment needs met.    Recommendations for follow up therapy are one component of a multi-disciplinary discharge planning process, led by the attending physician.  Recommendations may be updated based on patient status, additional functional criteria and insurance authorization.  Follow Up Recommendations  Home health PT     Assistance Recommended at Discharge PRN  Patient can return home with the following Assist for transportation   Equipment Recommendations   (home O2)       Precautions / Restrictions Precautions Precautions: Fall Precaution Comments: monitor O2 Restrictions Weight Bearing Restrictions: No     Mobility  Bed Mobility Overal bed mobility: Modified Independent     Transfers Overall transfer level: Modified independent Equipment used: Rolling walker (2 wheels) Transfers: Sit to/from Stand Sit to Stand: Supervision   Ambulation/Gait Ambulation/Gait  assistance: Supervision Gait Distance (Feet): 200 Feet Assistive device: Rolling walker (2 wheels) Gait Pattern/deviations: Step-through pattern Gait velocity: WFL     General Gait Details: Pt was able to ambulate 200 ft without LOB or safety concern. Did desaturate to 83% on room air during ambulation.     Balance Overall balance assessment: Needs assistance Sitting-balance support: Feet supported Sitting balance-Leahy Scale: Good     Standing balance support: During functional activity;No upper extremity supported;Bilateral upper extremity supported Standing balance-Leahy Scale: Good         Cognition Arousal/Alertness: Awake/alert Behavior During Therapy: WFL for tasks assessed/performed Overall Cognitive Status: Within Functional Limits for tasks assessed      General Comments: Patient A and O x 4               Pertinent Vitals/Pain Pain Assessment: No/denies pain     PT Goals (current goals can now be found in the care plan section) Acute Rehab PT Goals Patient Stated Goal: to return home Progress towards PT goals: Progressing toward goals    Frequency    Min 2X/week      PT Plan Current plan remains appropriate       AM-PAC PT "6 Clicks" Mobility   Outcome Measure  Help needed turning from your back to your side while in a flat bed without using bedrails?: None Help needed moving from lying on your back to sitting on the side of a flat bed without using bedrails?: None Help needed moving to and from a bed to a chair (including a wheelchair)?: A Little Help needed standing up from a chair using your arms (e.g., wheelchair or bedside  chair)?: A Little Help needed to walk in hospital room?: A Little Help needed climbing 3-5 steps with a railing? : A Little 6 Click Score: 20    End of Session Equipment Utilized During Treatment: Gait belt;Oxygen Activity Tolerance: Patient tolerated treatment well Patient left: with call bell/phone within  reach;with family/visitor present Nurse Communication: Mobility status PT Visit Diagnosis: Muscle weakness (generalized) (M62.81)     Time: 7939-6886 PT Time Calculation (min) (ACUTE ONLY): 18 min  Charges:  $Gait Training: 8-22 mins                    Julaine Fusi PTA 10/04/21, 3:48 PM

## 2021-10-04 NOTE — Progress Notes (Signed)
Mobility Specialist - Progress Note   10/04/21 1500  Mobility  Activity Off unit  Mobility performed by Mobility specialist    Pt ambulating hallway with PT at time of attempt. Will attempt another date/time.    Kathee Delton Mobility Specialist 10/04/21, 3:30 PM

## 2021-10-05 LAB — BASIC METABOLIC PANEL
Anion gap: 7 (ref 5–15)
BUN: 29 mg/dL — ABNORMAL HIGH (ref 8–23)
CO2: 36 mmol/L — ABNORMAL HIGH (ref 22–32)
Calcium: 8.2 mg/dL — ABNORMAL LOW (ref 8.9–10.3)
Chloride: 94 mmol/L — ABNORMAL LOW (ref 98–111)
Creatinine, Ser: 1.04 mg/dL (ref 0.61–1.24)
GFR, Estimated: >60 mL/min (ref >60)
Glucose, Bld: 105 mg/dL — ABNORMAL HIGH (ref 70–99)
Potassium: 3.4 mmol/L — ABNORMAL LOW (ref 3.5–5.1)
Sodium: 137 mmol/L (ref 135–145)

## 2021-10-05 NOTE — Progress Notes (Signed)
Subjective: Feels better than admission  Exam: Vitals:   10/05/21 0754 10/05/21 0902  BP: 114/77 131/76  Pulse: (!) 56   Resp: 18   Temp: 98.5 F (36.9 C)   SpO2: 98%    Gen: In bed, NAD Resp: non-labored breathing, no acute distress. Abd: soft, nt  Neuro: MS: Awake, alert, interactive and appropriate CN: He has conjugate gaze, pupils are reactive bilaterally.  He does not have disconjugation with sustained upgaze, nor does he have ptosis.  His palate elevates symmetrically and well.  His smile is normal and not transverse. Motor: he has good strength bilaterally without significant fatigueability.  Sensory:intact to LT  Pertinent Labs: ACHr + from 2014 P/Q type calcium channel antibodies form 2014 - positive   Impression: 88 yo M with myasthenia gravis(MG) as evidenced by positive Ach-R antibodies in 2014 and consistent symptoms.The presence of acetyl-choline receptor antibodies confirms MG.  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  He presented with an acute CHF exacerbation. Whether MG is playing a role or not has been difficult to tease out. The fact that he has been hypercarbic and the fact that he gets so much relief with bi-pap made me suspect MG is playing a role. He has tolerated IVIG well.    Recommendations: 1) continue home pyridostigmine and mycophenolate 2) IVIG day 5/5 3) I discussed that I typically start steroids in the setting of an acute exacerbation, but the patient is very hesitant to do so and wishes to discuss with outpatient neurology at a later date.  I think that this is reasonable as the IVIG will likely buy him time. 4) Neurology will be available as needed.   Roland Rack, MD [facility] [phone]  If 7pm- 7am, please page neurology on call as listed in [place].

## 2021-10-05 NOTE — Discharge Summary (Signed)
[name] JSH:[id number] DOB: 06/08/1926 DOA: 09/28/2021  PCP: [name], MD  Admit date: 09/28/2021 Discharge date: 10/05/2021  Time spent: 35 minutes  Recommendations for Outpatient Follow-up:  Neurology, cardiology, and pcp f/u Bmp 1 week Attention to o2 need at f/u    Discharge Diagnoses:  Principal Problem:   Acute on chronic combined systolic and diastolic CHF (congestive heart failure) ([place]) Active Problems:   Coronary artery disease involving native coronary artery of native heart without angina pectoris   Acute respiratory failure with hypoxia ([place])   Essential hypertension   Mixed hyperlipidemia   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   Patient explains that approximately 3 days ago he began to develop shortness of breath.  Initially shortness of breath was mild in intensity but progressively became more more severe.  Shortness of breath is worse with exertion and improved with rest.  Patient explains that over the same period of time he has been experiencing progressively worsening bilateral lower extremity swelling.  Patient denies any associated chest pain, cough, fever, sick contacts, recent travel or contact with confirmed COVID-19 infection.   Patient symptoms continued to worsen until eventually EMS was contacted.  EMS promptly came to evaluate the patient and patient was placed on 4 L of supplemental oxygen and  brought into [facility] emergency department for evaluation.   Upon evaluation in the emergency department chest x-ray was performed revealing mild pulmonary edema.  BNP was performed and was found to be elevated at 869.  Patient was felt to be suffering from acute congestive heart failure and 40 mg of intravenous Lasix was administered.  The hospitalist group was then called to assess the patient for admission to the hospital.  Hospital Course:  Acute on chronic combined CHF exacerbation: w/ increasing peripheral edema, dyspnea on exertion & weight gain. Dietary indiscretions and intermittent noncompliance w/ lasix likely contributory. Treated with IV lasix with good response, transitioned back to home lasix at d/c with plan for close cardiology f/u    Myasthenia gravis & [name]: continue on home dose of pyridostigmine, cellcept. Treated for flare w/ 5 days ivig. Neuro advised steroids as well but pt declined. Plan will be outpt neurology f/u   Acute hypoxic & hypercarbic respiratory failure: likely secondary to chf and myasthenias. Unable to wean o2 here, will d/c on home o2 2 L   Debility: pt/ot advising hh pt/ot will need orders, also for rolling walker. Ordered.   Hx of CAD: denies chest pain. Continued on statin, aspirin     Procedures: none   Consultations: Neurology, cardiology  Discharge Exam: Vitals:   10/05/21 0754 10/05/21 0902  BP: 114/77 131/76  Pulse: (!) 56   Resp: 18   Temp: 98.5 F (36.9 C)   SpO2: 98%     General exam: Appears calm but uncomfortable   Respiratory system: bibasilar rales otherwise clear Cardiovascular system: S1 & S2+. No rubs or gallops  Gastrointestinal system: Abd is soft, NT, ND & hypoactive bowel sounds  Central nervous  system: Alert and awake. Moves all extremities  Psychiatry: Judgement and insight appears normal. Flat mood and affect  Discharge Instructions   Discharge Instructions     Diet - low sodium heart healthy    Complete by: As directed    Diet - low sodium heart healthy   Complete by: As directed    Increase activity slowly   Complete by: As directed    Increase activity slowly   Complete by: As directed       Allergies as of 10/05/2021       Reactions   Beta Adrenergic Blockers Other (See Comments)   Beta Blocker will precipitate acute weakness in Lambert-Eaton Syndrome or Myasthenia Gravis   Calcium Channel Blockers Other (See Comments)   Calcium Channel Blocker- will precipitate acute weakness in Lambert-Eaton Syndrome.   Ciprofloxacin Other (See Comments)   Last resort due to Myasthia Gravis   Sulfa Antibiotics Rash        Medication List     TAKE these medications    aspirin 81 MG tablet Take 81 mg by mouth daily.   finasteride 5 MG tablet Commonly known as: PROSCAR Take 5 mg by mouth daily.   furosemide 40 MG tablet Commonly known as: LASIX Take 1 tablet (40 mg total) by mouth daily.   hydroxypropyl methylcellulose / hypromellose 2.5 % ophthalmic solution Commonly known as: ISOPTO TEARS / GONIOVISC Place 1 drop into both eyes daily.   mycophenolate 250 MG capsule Commonly known as: CELLCEPT Take 1,000 mg by mouth 2 (two) times daily.   pravastatin 20 MG tablet Commonly known as: PRAVACHOL Take 1 tablet (20 mg total) by mouth daily.   prednisoLONE acetate 1 % ophthalmic suspension Commonly known as: PRED FORTE Place 1 drop into the right eye daily. Per patient.   pyridostigmine 60 MG tablet Commonly known as: MESTINON Take 30 mg by mouth 6 (six) times daily.   tacrolimus 0.1 % ointment Commonly known as: Protopic Apply topically 2 (two) times daily.   tamsulosin 0.4 MG Caps capsule Commonly known as: FLOMAX Take 0.4 mg by mouth daily.               Durable Medical Equipment  (From admission, onward)           Start     Ordered   10/04/21 1552  DME Oxygen  Once       Question Answer Comment  Length of Need 6 Months   Mode or (Route)  Nasal cannula   Liters per Minute 2   Frequency Continuous (stationary and portable oxygen unit needed)   Oxygen conserving device No   Oxygen delivery system Gas      10/04/21 1551           Allergies  Allergen Reactions   Beta Adrenergic Blockers Other (See Comments)    Beta Blocker will precipitate acute weakness in Lambert-Eaton Syndrome or Myasthenia Gravis   Calcium Channel Blockers Other (See Comments)    Calcium Channel Blocker- will precipitate acute weakness in Lambert-Eaton Syndrome.   Ciprofloxacin Other (See Comments)    Last resort due to Early Antibiotics Rash    Follow-up Information     Corey Skains, MD. Go in 1 week(s).   Specialty: Cardiology Why: 1-2 weeks after discharge Contact information: [address] [facility] [place] [address] [phone]         [name], MD Follow up.   Specialty: Internal Medicine Contact  information: [address] [place] [id number] [phone]         Your [facility] neurologist Follow up.   Why: as scheduled                 The results of significant diagnostics from this hospitalization (including imaging, microbiology, ancillary and laboratory) are listed below for reference.    Significant Diagnostic Studies: DG Chest 1 View  Result Date: 09/29/2021 CLINICAL DATA:  Shortness of breath. Patient has a history of congestive heart failure. EXAM: CHEST  1 VIEW COMPARISON:  September 28, 2021 5:44 p.m. FINDINGS: The heart size is enlarged. Mediastinal contour is stable. There is increased pulmonary interstitium bilaterally worsened compared prior exam. Probable small left pleural effusion is identified. Osseous structures are stable. IMPRESSION: Congestive heart failure worsened compared prior exam. Electronically Signed   By: Abelardo Diesel M.D.   On: 09/29/2021 09:27   DG Chest 2 View  Result Date: 09/28/2021 CLINICAL  DATA:  Short of breath for the past 3 days. EXAM: CHEST - 2 VIEW COMPARISON:  01/13/2020. FINDINGS: Cardiac silhouette mildly enlarged.  No mediastinal or hilar masses. Bilateral interstitial thickening. Additional opacities noted in the left lung base, likely atelectasis. No pleural effusion.  No pneumothorax. Skeletal structures are demineralized, but grossly intact. IMPRESSION: 1. Cardiomegaly with interstitial thickening. Suspect mild congestive heart failure. Electronically Signed   By: Lajean Manes M.D.   On: 09/28/2021 18:22   ECHOCARDIOGRAM COMPLETE  Result Date: 09/30/2021    ECHOCARDIOGRAM REPORT   Patient Name:   RHYSE LOUX Date of Exam: 09/30/2021 Medical Rec #:  [id number]       Height:       70.0 in Accession #:    [id number]      Weight:       199.3 lb Date of Birth:  10-15-1925       BSA:          2.084 m Patient Age:    72 years        BP:           124/64 mmHg Patient Gender: M               HR:           66 bpm. Exam Location:  [facility] Procedure: 2D Echo, Cardiac Doppler and Color Doppler Indications:     CHF-acute systolic J88.41  History:         Patient has prior history of Echocardiogram examinations, most                  recent 01/15/2020. CAD; Risk Factors:Hypertension.  Sonographer:     Sherrie Sport Referring Phys:  [id number] [place] Diagnosing Phys: Donnelly Angelica  Sonographer Comments: Suboptimal apical window. IMPRESSIONS  1. Left ventricular ejection fraction, by estimation, is 30 to 35%. The left ventricle has moderately decreased function. The left ventricle demonstrates regional wall motion abnormalities (see scoring diagram/findings for description). Left ventricular  diastolic parameters are consistent with Grade II diastolic dysfunction (pseudonormalization).  2. Right ventricular systolic function is normal. The right ventricular size is not well visualized.  3. Left atrial size was severely dilated.  4. The mitral valve is normal in structure. Mild mitral valve  regurgitation. No evidence of mitral stenosis.  5. The aortic valve is calcified. Aortic valve regurgitation is not visualized. Aortic valve sclerosis/calcification is present, without any evidence of aortic stenosis. Comparison(s): No significant change from prior study. FINDINGS  Left Ventricle: Left ventricular ejection fraction, by estimation, is 30 to 35%. The left ventricle has moderately decreased function. The left ventricle demonstrates regional wall motion abnormalities. Severe hypokinesis of the left ventricular, entire  apical segment and anteroseptal wall. The left ventricular internal cavity size was normal in size. There is no left ventricular hypertrophy. Left ventricular diastolic parameters are consistent with Grade II diastolic dysfunction (pseudonormalization). Right Ventricle: The right ventricular size is not well visualized. Right vetricular wall thickness was not well visualized. Right ventricular systolic function is normal. Left Atrium: Left atrial size was severely dilated. Right Atrium: Right atrial size was normal in size. Pericardium: There is no evidence of pericardial effusion. Mitral Valve: The mitral valve is normal in structure. Mild mitral valve regurgitation. No evidence of mitral valve stenosis. MV peak gradient, 6.1 mmHg. The mean mitral valve gradient is 2.0 mmHg. Tricuspid Valve: The tricuspid valve is not well visualized. Tricuspid valve regurgitation is trivial. Aortic Valve: The aortic valve is calcified. Aortic valve regurgitation is not visualized. Aortic valve sclerosis/calcification is present, without any evidence of aortic stenosis. Aortic valve mean gradient measures 3.0 mmHg. Aortic valve peak gradient measures 5.3 mmHg. Aortic valve area, by VTI measures 1.69 cm. Pulmonic Valve: The pulmonic valve was not well visualized. Pulmonic valve regurgitation is mild. No evidence of pulmonic stenosis. Aorta: The aortic root and ascending aorta are structurally normal,  with no evidence of dilitation. IAS/Shunts: The interatrial septum was not well visualized.  LEFT VENTRICLE PLAX 2D LVIDd:         5.00 cm      Diastology LVIDs:         4.10 cm      LV e' medial:    4.03 cm/s LV PW:         1.30 cm      LV E/e' medial:  21.3 LV IVS:        1.50 cm      LV e' lateral:   8.59 cm/s LVOT diam:     2.00 cm      LV E/e' lateral: 10.0 LV SV:         36 LV SV Index:   17 LVOT Area:     3.14 cm  LV Volumes (MOD) LV vol d, MOD A2C: 178.0 ml LV vol d, MOD A4C: 207.0 ml LV vol s, MOD A2C: 165.0 ml LV vol s, MOD A4C: 96.2 ml LV SV MOD A2C:     13.0 ml LV SV MOD A4C:     207.0 ml LV SV MOD BP:      57.3 ml RIGHT VENTRICLE RV S prime:     18.00 cm/s TAPSE (M-mode): 4.8 cm LEFT ATRIUM              Index        RIGHT ATRIUM           Index LA diam:        5.30 cm  2.54 cm/m   RA Area:     17.30 cm LA Vol (A2C):   101.0 ml 48.46 ml/m  RA Volume:   48.50 ml  23.27 ml/m LA Vol (A4C):   127.0 ml 60.93 ml/m LA Biplane Vol: 118.0 ml 56.62 ml/m  AORTIC VALVE                    PULMONIC VALVE AV Area (Vmax):    1.30 cm     PV Vmax:  0.78 m/s AV Area (Vmean):   1.29 cm     PV Vmean:       53.600 cm/s AV Area (VTI):     1.69 cm     PV VTI:         0.152 m AV Vmax:           114.67 cm/s  PV Peak grad:   2.4 mmHg AV Vmean:          76.567 cm/s  PV Mean grad:   1.5 mmHg AV VTI:            0.214 m      RVOT Peak grad: 4 mmHg AV Peak Grad:      5.3 mmHg AV Mean Grad:      3.0 mmHg LVOT Vmax:         47.60 cm/s LVOT Vmean:        31.500 cm/s LVOT VTI:          0.115 m LVOT/AV VTI ratio: 0.54  AORTA Ao Root diam: 2.93 cm MITRAL VALVE               TRICUSPID VALVE MV Area (PHT): 4.80 cm    TR Peak grad:   27.2 mmHg MV Area VTI:   1.02 cm    TR Vmax:        261.00 cm/s MV Peak grad:  6.1 mmHg MV Mean grad:  2.0 mmHg    SHUNTS MV Vmax:       1.23 m/s    Systemic VTI:  0.12 m MV Vmean:      61.9 cm/s   Systemic Diam: 2.00 cm MV Decel Time: 158 msec    Pulmonic VTI:  0.217 m MV E velocity: 85.70 cm/s  MV A velocity: 99.40 cm/s MV E/A ratio:  0.86 Donnelly Angelica Electronically signed by Donnelly Angelica Signature Date/Time: 09/30/2021/12:59:07 PM    Final    CT HEAD CODE STROKE WO CONTRAST  Result Date: 09/29/2021 CLINICAL DATA:  Code stroke. EXAM: CT HEAD WITHOUT CONTRAST TECHNIQUE: Contiguous axial images were obtained from the base of the skull through the vertex without intravenous contrast. COMPARISON:  None. FINDINGS: Brain: No evidence of acute infarction, hemorrhage, hydrocephalus, extra-axial collection or mass lesion/mass effect. Vascular: No hyperdense vessel or unexpected calcification. Skull: Normal. Negative for fracture or focal lesion. Sinuses/Orbits: No acute finding Other: These results were communicated to Dr. Rory Percy at 8:30 am on 09/29/2021 by text page via the [facility] messaging system. ASPECTS [facility] Stroke Program Early CT Score) Not scored without localizing symptom IMPRESSION: Motion degraded head CT without acute finding. Electronically Signed   By: Jorje Guild M.D.   On: 09/29/2021 08:31   CT ANGIO HEAD NECK W WO CM W PERF (CODE STROKE)  Result Date: 09/29/2021 CLINICAL DATA:  Acute stroke suspected EXAM: CT ANGIOGRAPHY HEAD AND NECK TECHNIQUE: Multidetector CT imaging of the head and neck was performed using the standard protocol during bolus administration of intravenous contrast. Multiplanar CT image reconstructions and MIPs were obtained to evaluate the vascular anatomy. Carotid stenosis measurements (when applicable) are obtained utilizing NASCET criteria, using the distal internal carotid diameter as the denominator. CONTRAST:  Dose is not known on this in progress study COMPARISON:  Noncontrast head CT earlier the same day FINDINGS: CTA NECK FINDINGS Aortic arch: Elongated and atheromatous aorta. Right carotid system: Diffuse atheromatous plaque. Mixed density, mainly calcified plaque at the bifurcation and bulb with proximal ICA stenosis measuring up to 50%. Left  carotid system:  Atheromatous wall thickening. Calcified plaque at the proximal ICA without associated stenosis. Subsequently is a vessel kink which narrows the lumen to approximately 55%. No ulceration or dissection. Vertebral arteries: Proximal subclavian atherosclerosis and tortuosity without flow limiting stenosis. The right vertebral artery is dominant. Calcified plaque at the right vertebral origin causing 50% stenosis based on sagittal reformats. No dissection or beading. Skeleton: No acute or aggressive finding. Other neck: Negative Upper chest: Small pleural effusions. Airway cuffing and interlobular septal thickening. Review of the MIP images confirms the above findings CTA HEAD FINDINGS Anterior circulation: Extensive atheromatous plaque along the carotid siphons. No significant stenosis, proximal occlusion, aneurysm, or vascular malformation. Posterior circulation: No significant stenosis, proximal occlusion, aneurysm, or vascular malformation. Venous sinuses: Diffusely patent Anatomic variants: None significant Review of the MIP images confirms the above findings IMPRESSION: 1. No emergent finding. 2. Atherosclerosis with ~50% narrowing at the bilateral proximal ICA and right vertebral origin. 3. Probable CHF. Electronically Signed   By: Jorje Guild M.D.   On: 09/29/2021 08:56    Microbiology: Recent Results (from the past 240 hour(s))  Resp Panel by RT-PCR (Flu A&B, Covid) Nasopharyngeal Swab     Status: None   Collection Time: 09/28/21  5:51 PM   Specimen: Nasopharyngeal Swab; Nasopharyngeal(NP) swabs in vial transport medium  Result Value Ref Range Status   SARS Coronavirus 2 by RT PCR NEGATIVE NEGATIVE Final    Comment: (NOTE) SARS-CoV-2 target nucleic acids are NOT DETECTED.  The SARS-CoV-2 RNA is generally detectable in upper respiratory specimens during the acute phase of infection. The lowest concentration of SARS-CoV-2 viral copies this assay can detect is 138 copies/mL. A negative result  does not preclude SARS-Cov-2 infection and should not be used as the sole basis for treatment or other patient management decisions. A negative result may occur with  improper specimen collection/handling, submission of specimen other than nasopharyngeal swab, presence of viral mutation(s) within the areas targeted by this assay, and inadequate number of viral copies(<138 copies/mL). A negative result must be combined with clinical observations, patient history, and epidemiological information. The expected result is Negative.  Fact Sheet for Patients:  EntrepreneurPulse.com.au  Fact Sheet for Healthcare Providers:  IncredibleEmployment.be  This test is no t yet approved or cleared by the [place] FDA and  has been authorized for detection and/or diagnosis of SARS-CoV-2 by FDA under an Emergency Use Authorization (EUA). This EUA will remain  in effect (meaning this test can be used) for the duration of the COVID-19 declaration under Section 564(b)(1) of the Act, 21 U.S.C.section 360bbb-3(b)(1), unless the authorization is terminated  or revoked sooner.       Influenza A by PCR NEGATIVE NEGATIVE Final   Influenza B by PCR NEGATIVE NEGATIVE Final    Comment: (NOTE) The Xpert Xpress SARS-CoV-2/FLU/RSV plus assay is intended as an aid in the diagnosis of influenza from Nasopharyngeal swab specimens and should not be used as a sole basis for treatment. Nasal washings and aspirates are unacceptable for Xpert Xpress SARS-CoV-2/FLU/RSV testing.  Fact Sheet for Patients: EntrepreneurPulse.com.au  Fact Sheet for Healthcare Providers: IncredibleEmployment.be  This test is not yet approved or cleared by the [place] FDA and has been authorized for detection and/or diagnosis of SARS-CoV-2 by FDA under an Emergency Use Authorization (EUA). This EUA will remain in effect (meaning this test can be used) for  the duration of the COVID-19 declaration under Section 564(b)(1) of the Act, 21 U.S.C. section 360bbb-3(b)(1), unless the authorization is  terminated or revoked.  Performed at [facility], [address] [place], [place] [address]      Labs: Basic Metabolic Panel: Recent Labs  Lab 09/29/21 0500 09/30/21 0555 10/01/21 0905 10/02/21 0648 10/03/21 0653 10/04/21 0516 10/05/21 0353  NA 137   < > 136 140 140 139 137  K 4.5   < > 3.8 3.7 3.6 4.1 3.4*  CL 99   < > 94* 93* 93* 94* 94*  CO2 29   < > 36* 36* 37* 37* 36*  GLUCOSE 114*   < > 107* 105* 102* 102* 105*  BUN 21   < > 21 22 22  31* 29*  CREATININE 0.99   < > 0.89 1.00 1.06 1.02 1.04  CALCIUM 8.8*   < > 8.4* 8.5* 8.3* 8.6* 8.2*  MG 2.1  --   --   --   --   --   --    < > = values in this interval not displayed.   Liver Function Tests: Recent Labs  Lab 09/29/21 0500  AST 23  ALT 21  ALKPHOS 73  BILITOT 1.1  PROT 6.8  ALBUMIN 3.9   No results for input(s): LIPASE, AMYLASE in the last 168 hours. No results for input(s): AMMONIA in the last 168 hours. CBC: Recent Labs  Lab 09/28/21 1751 09/29/21 0500 09/30/21 0555 10/01/21 0905 10/02/21 0648  WBC 6.7 6.8 6.8 6.9 4.5  NEUTROABS  --  5.3  --   --   --   HGB 13.4 13.5 11.9* 12.0* 12.4*  HCT 43.8 44.6 40.4 39.1 40.3  MCV 96.1 97.4 98.3 95.4 93.3  PLT 200 207 174 179 184   Cardiac Enzymes: No results for input(s): CKTOTAL, CKMB, CKMBINDEX, TROPONINI in the last 168 hours. BNP: BNP (last 3 results) Recent Labs    09/28/21 1751 10/01/21 0905  BNP 869.3* 580.4*    ProBNP (last 3 results) No results for input(s): PROBNP in the last 8760 hours.  CBG: Recent Labs  Lab 09/29/21 0845  GLUCAP 108*       Signed:  Desma Maxim MD.  [facility] 10/05/2021, 10:34 AM

## 2021-10-05 NOTE — Progress Notes (Signed)
Discharge instructions explained to pt and daughter/both verbalized understanding. IV and tele removed. Will transport off unit via wheelchair.

## 2021-10-05 NOTE — Progress Notes (Deleted)
Patient ID: [name], male    DOB: Dec 28, 1925, 88 y.o.   MRN: [id number]   [name] is a 88 y/o male with a history of HTN, hyperlipidemia, CAD, carotid disease, bladder cancer, anemia, Lambert-Eaton syndrome, myasthenia gravis, previous tobacco use and chronic heart failure.   Echo report from 09/30/21 reviewed and showed an EF of 30-35% along with severe LAE and mild Joel. Echo report from 01/15/20 reviewed and showed an EF of 30-35% along with moderately elevated PA pressure, mild Joel, mild AS and moderate TR.   Catheterization done 04/06/14.  Admitted 09/28/21 due to acute on chronic shortness of breath. Initially given IV lasix with transition to oral diuretics. Neurology and cardiology consults obtained. Discharged after 7 days.   He presents today for a follow-up visit with a chief complaint of    Past Medical History:  Diagnosis Date   Abnormal chest CT 03/28/2017   Allergy    Anemia    Angina pectoris ([place]) 04/02/2011   Aortic aneurysm ([place]) 03/28/2017   Saw Dr Genevive Bi 04/16/17 - recommended f/u CT in 3 months.     Atherosclerosis of both carotid arteries 07/17/2014   Benign lipomatous neoplasm of skin and subcutaneous tissue of left arm 10/04/2013   Bilateral carotid artery stenosis 08/30/2014   Overview:  Less than 50% 2015   Bladder cancer ([place]) 03/05/2013   CAD (coronary artery disease) 06/18/2014   Cancer ([place]) 11-24-12   bladder. BCG treatments by Dr Jacqlyn Larsen.   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 WD SCC. Re-shaved 01/14/2016. Excised 02/05/2016, margins free.   Squamous cell carcinoma of skin 11/03/2018   Right cheek ant. to sideburn. Poorly differentiated.   Squamous cell carcinoma of skin 02/02/2019   Right mid dorsum forearm. WD SCC. EDC.   Past Surgical History:  Procedure Laterality Date   [place]   Family History  Problem Relation Age of Onset   Lung cancer Sister    Prostate cancer Brother    Lung cancer Brother    Arthritis Other        parent   Colon cancer Neg Hx    Social History   Tobacco Use   Smoking status: Former    Types: Cigarettes    Quit date: 09/29/1958    Years since quitting: 63.0   Smokeless tobacco: Never  Substance Use Topics   Alcohol use: Yes    Alcohol/week: 0.0 standard drinks    Comment: occasionally   Allergies  Allergen Reactions   Beta Adrenergic Blockers Other (See Comments)    Beta Blocker will precipitate acute weakness in Lambert-Eaton Syndrome or  Myasthenia Gravis   Calcium Channel Blockers Other (See Comments)    Calcium Channel Blocker- will precipitate acute weakness in Lambert-Eaton Syndrome.   Ciprofloxacin Other (See Comments)    Last resort due to Myasthia Gravis   Sulfa Antibiotics Rash     Review of Systems  Constitutional:  Positive for fatigue. Negative for appetite change.  HENT:  Negative for congestion, rhinorrhea and sore throat.   Eyes: Negative.   Respiratory:  Positive for shortness of breath (with moderate exertion). Negative for cough and wheezing.   Cardiovascular:  Positive for leg swelling. Negative for chest pain and palpitations.  Gastrointestinal:  Negative  for abdominal distention and abdominal pain.  Endocrine: Negative.   Genitourinary: Negative.   Musculoskeletal:  Negative for arthralgias, back pain and neck pain.  Skin: Negative.   Allergic/Immunologic: Negative.   Neurological:  Negative for dizziness and light-headedness.  Hematological:  Negative for adenopathy. Does not bruise/bleed easily.  Psychiatric/Behavioral:  Negative for dysphoric mood and sleep disturbance (sleeping on 1-2 pillows). The patient is not nervous/anxious.      Physical Exam Vitals and nursing note reviewed.  Constitutional:      Appearance: Normal appearance.  HENT:     Head: Normocephalic and atraumatic.  Cardiovascular:     Rate and Rhythm: Normal rate and regular rhythm.  Pulmonary:     Effort: Pulmonary effort is normal. No respiratory distress.     Breath sounds: No wheezing or rales.  Abdominal:     General: There is no distension.     Palpations: Abdomen is soft.  Musculoskeletal:        General: No tenderness.     Cervical back: Normal range of motion and neck supple.     Right lower leg: Edema (1+ pitting) present.     Left lower leg: Edema (2+ pitting) present.  Skin:    General: Skin is warm and dry.  Neurological:     General: No focal deficit present.     Mental Status: He is alert and oriented to person, place, and time.  Psychiatric:        Mood and Affect: Mood normal.        Behavior: Behavior normal.        Thought Content: Thought content normal.    Assessment & Plan:  1: Chronic heart failure with reduced ejection fraction- - NYHA class II - euvolemic today - weighing daily; reminded to call for an overnight weight gain of >2 pounds or a weekly weight gain of >5 pounds - weight 198.6 pounds from last visit here 4 months ago  - not adding salt and has been looking at food labels; reminded to closely follow a low sodium diet - unsure if BP could tolerate entresto as it's been low in the past - saw cardiology [name])  08/08/21 -- beta-blockers and calcium channel blockers cause weakness due to Lambert-Eaton syndrome - BNP 01/13/20 was 764.0  2: HTN- - BP  - saw PCP [name]) 05/27/21 - BMP 10/05/21 reviewed and showed sodium 137, potassium 3.4, creatinine 1.04 and GFR >60  3: Lymphedema- - stage 2 - elevating his legs when sitting for long periods of time  - getting the compression socks on daily with removal at bedtime with the assistance of compression sock device; edema has improved some - saw lymphedema clinic on 03/19/21 - saw vascular [facility] Shark) 08/14/21   Medication list reviewed.

## 2021-10-05 NOTE — Progress Notes (Signed)
NIF -28 

## 2021-10-06 DIAGNOSIS — C44300 Unspecified malignant neoplasm of skin of unspecified part of face: Secondary | ICD-10-CM | POA: Diagnosis not present

## 2021-10-06 DIAGNOSIS — I110 Hypertensive heart disease with heart failure: Secondary | ICD-10-CM | POA: Diagnosis not present

## 2021-10-06 DIAGNOSIS — G7000 Myasthenia gravis without (acute) exacerbation: Secondary | ICD-10-CM | POA: Diagnosis not present

## 2021-10-06 DIAGNOSIS — J9602 Acute respiratory failure with hypercapnia: Secondary | ICD-10-CM | POA: Diagnosis not present

## 2021-10-06 DIAGNOSIS — C44601 Unspecified malignant neoplasm of skin of unspecified upper limb, including shoulder: Secondary | ICD-10-CM | POA: Diagnosis not present

## 2021-10-06 DIAGNOSIS — I890 Lymphedema, not elsewhere classified: Secondary | ICD-10-CM | POA: Diagnosis not present

## 2021-10-06 DIAGNOSIS — I081 Rheumatic disorders of both mitral and tricuspid valves: Secondary | ICD-10-CM | POA: Diagnosis not present

## 2021-10-06 DIAGNOSIS — G7080 Lambert-Eaton syndrome, unspecified: Secondary | ICD-10-CM | POA: Diagnosis not present

## 2021-10-06 DIAGNOSIS — E782 Mixed hyperlipidemia: Secondary | ICD-10-CM | POA: Diagnosis not present

## 2021-10-06 DIAGNOSIS — I2510 Atherosclerotic heart disease of native coronary artery without angina pectoris: Secondary | ICD-10-CM | POA: Diagnosis not present

## 2021-10-06 DIAGNOSIS — N400 Enlarged prostate without lower urinary tract symptoms: Secondary | ICD-10-CM | POA: Diagnosis not present

## 2021-10-06 DIAGNOSIS — C679 Malignant neoplasm of bladder, unspecified: Secondary | ICD-10-CM | POA: Diagnosis not present

## 2021-10-06 DIAGNOSIS — J9601 Acute respiratory failure with hypoxia: Secondary | ICD-10-CM | POA: Diagnosis not present

## 2021-10-06 DIAGNOSIS — I6523 Occlusion and stenosis of bilateral carotid arteries: Secondary | ICD-10-CM | POA: Diagnosis not present

## 2021-10-06 DIAGNOSIS — I5043 Acute on chronic combined systolic (congestive) and diastolic (congestive) heart failure: Secondary | ICD-10-CM | POA: Diagnosis not present

## 2021-10-06 DIAGNOSIS — I719 Aortic aneurysm of unspecified site, without rupture: Secondary | ICD-10-CM | POA: Diagnosis not present

## 2021-10-07 ENCOUNTER — Telehealth: Payer: Self-pay

## 2021-10-07 ENCOUNTER — Ambulatory Visit: Payer: Medicare Other | Admitting: Family

## 2021-10-07 ENCOUNTER — Telehealth: Payer: Self-pay | Admitting: Internal Medicine

## 2021-10-07 NOTE — Telephone Encounter (Signed)
Transition Care Management Unsuccessful Follow-up Telephone Call  Date of discharge and from where:  10/05/21 [facility]  Attempts:  1st Attempt  Reason for unsuccessful TCM follow-up call:  Unable to leave message. Will follow.

## 2021-10-07 NOTE — Telephone Encounter (Signed)
Pt daughter called in wanting an hospital follow-up. Pt went into hospital on 12/31 for congestive heart failure and went home with oxygen which is new and myasthenia flare. Pt has home physical therapy and oxygen and need an appt within two weeks. Pt was discharged on 12/7

## 2021-10-08 NOTE — Telephone Encounter (Signed)
Called patient to confirm doing ok. Scheduled him for HFU appt 1/13 at 11:00. Has appt with heart failure clinic but is unsure if he will keep. Patient asked that I schedule him for the 1/13 appt and call his daughter to let her know since she will be the one bringing. Patient and daughter aware of appt date and time. No further concerns at this time.

## 2021-10-08 NOTE — Telephone Encounter (Signed)
Transition Care Management Follow-up Telephone Call Date of discharge and from where: 10/05/21 [facility] How have you been since you were released from the hospital? Patient states,"I am doing real good."  Denies new or worsening symptoms. Oxygen not in use today, room air sats 96. Will monitor and use at bedtime if needed. Denies chest pain, shortness of breath, dizziness and all other symptoms.  Any questions or concerns? No  Items Reviewed: Did the pt receive and understand the discharge instructions provided? Yes  Medications obtained and verified? Yes  Any new allergies since your discharge? No  Dietary orders reviewed? Yes Do you have support at home? Yes , son lives close by.   Home Care and Equipment/Supplies: Were home health services ordered? HHPT in progress.   Functional Questionnaire: (I = Independent and D = Dependent) ADLs: I  Follow up appointments reviewed:  PCP Hospital f/u appt confirmed? Yes  Scheduled to see 10/11/21 on @ 11:00.  [facility] f/u appt confirmed? Yes   Are transportation arrangements needed? No  If their condition worsens, is the pt aware to call PCP or go to the Emergency Dept.? Yes Was the patient provided with contact information for the PCP's office or ED? Yes Was to pt encouraged to call back with questions or concerns? Yes

## 2021-10-11 ENCOUNTER — Inpatient Hospital Stay: Payer: Medicare Other | Admitting: Internal Medicine

## 2021-10-11 ENCOUNTER — Ambulatory Visit: Payer: Medicare Other | Admitting: Family

## 2021-10-11 ENCOUNTER — Ambulatory Visit (INDEPENDENT_AMBULATORY_CARE_PROVIDER_SITE_OTHER): Payer: Medicare Other | Admitting: Internal Medicine

## 2021-10-11 ENCOUNTER — Other Ambulatory Visit: Payer: Self-pay

## 2021-10-11 VITALS — BP 126/70 | HR 70 | Temp 97.9°F | Resp 16 | Ht 70.0 in | Wt 200.0 lb

## 2021-10-11 DIAGNOSIS — I2510 Atherosclerotic heart disease of native coronary artery without angina pectoris: Secondary | ICD-10-CM

## 2021-10-11 DIAGNOSIS — E7800 Pure hypercholesterolemia, unspecified: Secondary | ICD-10-CM

## 2021-10-11 DIAGNOSIS — I712 Thoracic aortic aneurysm, without rupture, unspecified: Secondary | ICD-10-CM

## 2021-10-11 DIAGNOSIS — G7080 Lambert-Eaton syndrome, unspecified: Secondary | ICD-10-CM

## 2021-10-11 DIAGNOSIS — I5043 Acute on chronic combined systolic (congestive) and diastolic (congestive) heart failure: Secondary | ICD-10-CM

## 2021-10-11 DIAGNOSIS — D649 Anemia, unspecified: Secondary | ICD-10-CM

## 2021-10-11 DIAGNOSIS — I10 Essential (primary) hypertension: Secondary | ICD-10-CM

## 2021-10-11 DIAGNOSIS — I5022 Chronic systolic (congestive) heart failure: Secondary | ICD-10-CM | POA: Diagnosis not present

## 2021-10-11 DIAGNOSIS — J9601 Acute respiratory failure with hypoxia: Secondary | ICD-10-CM

## 2021-10-11 DIAGNOSIS — R0602 Shortness of breath: Secondary | ICD-10-CM

## 2021-10-11 DIAGNOSIS — R739 Hyperglycemia, unspecified: Secondary | ICD-10-CM

## 2021-10-11 LAB — BASIC METABOLIC PANEL
BUN: 28 mg/dL — ABNORMAL HIGH (ref 6–23)
CO2: 34 mEq/L — ABNORMAL HIGH (ref 19–32)
Calcium: 9.5 mg/dL (ref 8.4–10.5)
Chloride: 97 mEq/L (ref 96–112)
Creatinine, Ser: 1.1 mg/dL (ref 0.40–1.50)
GFR: 58.03 mL/min — ABNORMAL LOW (ref >60.00)
Glucose, Bld: 102 mg/dL — ABNORMAL HIGH (ref 70–99)
Potassium: 4 mEq/L (ref 3.5–5.1)
Sodium: 139 mEq/L (ref 135–145)

## 2021-10-11 NOTE — Progress Notes (Signed)
Patient ID: KYRUS HYDE, male   DOB: 21-Dec-1925, 88 y.o.   MRN: [id number]   Subjective:    Patient ID: ARSHAD OBERHOLZER, male    DOB: 23-Aug-1926, 88 y.o.   MRN: [id number]  This visit occurred during the SARS-CoV-2 public health emergency.  Safety protocols were in place, including screening questions prior to the visit, additional usage of staff PPE, and extensive cleaning of exam room while observing appropriate contact time as indicated for disinfecting solutions.   Patient here for hospital follow up.   Chief Complaint  Patient presents with   Hospitalization Follow-up   .   HPI Admitted 09/28/21 - 10/05/21 after presenting with sob. Brought in by EMS.  CXR - mild pulmonary edema.  BNP elevated.  Given IV lasix.  Diagnosed with acute on chronic CHF exacerbation.  Transitioned back to home lasix with recommendation for cardiology f/u.  Was d/c'd home with oxygen.  With his history of myasthenia and [name], he was treated with IVIG.  Declined steroids.  PT/OT recommended.  He is accompanied by his daughter.  History obtained from both of them.  Reports he is feeling better.  Breathing better.  Not using oxygen currently (here in the office).  No increased cough or congestion.  No chest pain.  No acid reflux.  No abdominal pain.  Bowels moving.  Due to f/u with cardiology next week and f/u with neurology planned.  Due to start PT next week.  Weighing daily.  Discussed diet.     Past Medical History:  Diagnosis Date   Abnormal chest CT 03/28/2017   Allergy    Anemia    Angina pectoris ([place]) 04/02/2011   Aortic aneurysm ([place]) 03/28/2017   Saw Dr Genevive Bi 04/16/17 - recommended f/u CT in 3 months.     Atherosclerosis of both carotid arteries 07/17/2014   Benign lipomatous neoplasm of skin and subcutaneous tissue of left arm 10/04/2013   Bilateral carotid artery stenosis 08/30/2014   Overview:  Less than 50% 2015   Bladder cancer ([place]) 03/05/2013   CAD (coronary artery disease)  06/18/2014   Cancer ([place]) 11-24-12   bladder. BCG treatments by Dr Jacqlyn Larsen.   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 WD SCC. Re-shaved 01/14/2016. Excised 02/05/2016, margins free.   Squamous cell carcinoma of skin 11/03/2018   Right cheek ant. to sideburn. Poorly differentiated.   Squamous cell carcinoma of skin 02/02/2019   Right mid dorsum forearm. WD SCC. EDC.   Past Surgical History:  Procedure Laterality Date   [place]   Family History  Problem Relation Age of Onset   Lung  cancer Sister    Prostate cancer Brother    Lung cancer Brother    Arthritis Other        parent   Colon cancer Neg Hx    Social History   Socioeconomic History   Marital status: Married    Spouse name: Not on file   Number of children: Not on file   Years of education: Not on file   Highest education level: Not on file  Occupational History   Not on file  Tobacco Use   Smoking status: Former    Types: Cigarettes    Quit date: 09/29/1958    Years since quitting: 63.0   Smokeless tobacco: Never  Vaping Use   Vaping Use: Never used  Substance and Sexual Activity   Alcohol use: Yes     Alcohol/week: 0.0 standard drinks    Comment: occasionally   Drug use: No   Sexual activity: Never  Other Topics Concern   Not on file  Social History Narrative   Pt is married with 2 children - 1 son, 1 daughter. Previously self-employed in [place] Strain: Low Risk    Difficulty of Paying Living Expenses: Not hard at all  Food Insecurity: No Food Insecurity   Worried About Charity fundraiser in the Last Year: Never true   Arboriculturist in the Last Year: Never true  Transportation Needs: No Transportation Needs   Lack of Transportation (Medical): No   Lack of Transportation (Non-Medical): No  Physical Activity: Not on file  Stress: No Stress Concern Present   Feeling of Stress : Not at all  Social Connections: Unknown   Frequency of Communication with Friends and Family: More than three times a week   Frequency of Social Gatherings with Friends and Family: More than three times a week   Attends Religious Services: Not on file   Active Member of Clubs or Organizations: Not on file   Attends Archivist Meetings: Not on file   Marital Status: Widowed     Review of Systems  Constitutional:  Negative for appetite change and unexpected weight change.  HENT:  Negative for congestion and sinus pressure.   Respiratory:  Negative for cough and chest tightness.        Breathing improved.   Cardiovascular:  Negative for chest pain and palpitations.       No increased swelling.    Gastrointestinal:  Negative for abdominal pain, diarrhea, nausea and vomiting.  Genitourinary:  Negative for difficulty urinating and dysuria.  Musculoskeletal:  Negative for joint swelling and myalgias.  Skin:  Negative for color change and rash.  Neurological:  Negative for dizziness, light-headedness and headaches.  Psychiatric/Behavioral:  Negative for agitation and dysphoric mood.       Objective:     BP 126/70    Pulse 70     Temp 97.9 F (36.6 C)    Resp 16    Ht $R'5\' 10"'gF$  (1.778 m)    Wt 200 lb (90.7 kg)    SpO2 95%    BMI 28.70 kg/m  Wt Readings from Last 3 Encounters:  10/11/21 200 lb (90.7 kg)  10/05/21 184 lb 11.9 oz (83.8 kg)  08/14/21 200 lb (90.7 kg)    Physical Exam Vitals reviewed.  Constitutional:      General: He is not in acute distress.    Appearance: Normal appearance. He is well-developed.  HENT:  Head: Normocephalic and atraumatic.     Right Ear: External ear normal.     Left Ear: External ear normal.  Eyes:     General: No scleral icterus.       Right eye: No discharge.        Left eye: No discharge.     Conjunctiva/sclera: Conjunctivae normal.  Cardiovascular:     Rate and Rhythm: Normal rate and regular rhythm.  Pulmonary:     Effort: Pulmonary effort is normal. No respiratory distress.     Breath sounds: Normal breath sounds.  Abdominal:     General: Bowel sounds are normal.     Palpations: Abdomen is soft.     Tenderness: There is no abdominal tenderness.  Musculoskeletal:        General: No swelling or tenderness.     Cervical back: Neck supple. No tenderness.  Lymphadenopathy:     Cervical: No cervical adenopathy.  Skin:    Findings: No erythema or rash.  Neurological:     Mental Status: He is alert.  Psychiatric:        Mood and Affect: Mood normal.        Behavior: Behavior normal.     Outpatient Encounter Medications as of 10/11/2021  Medication Sig   aspirin 81 MG tablet Take 81 mg by mouth daily.   finasteride (PROSCAR) 5 MG tablet Take 5 mg by mouth daily.   hydroxypropyl methylcellulose / hypromellose (ISOPTO TEARS / GONIOVISC) 2.5 % ophthalmic solution Place 1 drop into both eyes daily.   mycophenolate (CELLCEPT) 250 MG capsule Take 1,000 mg by mouth 2 (two) times daily.    pravastatin (PRAVACHOL) 20 MG tablet Take 1 tablet (20 mg total) by mouth daily.   prednisoLONE acetate (PRED FORTE) 1 % ophthalmic suspension Place 1 drop into the right eye  daily. Per patient.   pyridostigmine (MESTINON) 60 MG tablet Take 30 mg by mouth 6 (six) times daily.    tacrolimus (PROTOPIC) 0.1 % ointment Apply topically 2 (two) times daily.   tamsulosin (FLOMAX) 0.4 MG CAPS Take 0.4 mg by mouth daily.   furosemide (LASIX) 40 MG tablet Take 1 tablet (40 mg total) by mouth daily.   No facility-administered encounter medications on file as of 10/11/2021.     Lab Results  Component Value Date   WBC 4.5 10/02/2021   HGB 12.4 (L) 10/02/2021   HCT 40.3 10/02/2021   PLT 184 10/02/2021   GLUCOSE 102 (H) 10/11/2021   CHOL 140 02/22/2021   TRIG 121.0 02/22/2021   HDL 44.70 02/22/2021   LDLDIRECT 143.5 08/26/2013   LDLCALC 71 02/22/2021   ALT 21 09/29/2021   AST 23 09/29/2021   NA 139 10/11/2021   K 4.0 10/11/2021   CL 97 10/11/2021   CREATININE 1.10 10/11/2021   BUN 28 (H) 10/11/2021   CO2 34 (H) 10/11/2021   TSH 1.543 09/29/2021   INR 1.1 09/29/2021   HGBA1C 5.9 02/22/2021    ECHOCARDIOGRAM COMPLETE  Result Date: 09/30/2021    ECHOCARDIOGRAM REPORT   Patient Name:   GORDEN STTHOMAS Date of Exam: 09/30/2021 Medical Rec #:  [id number]       Height:       70.0 in Accession #:    [id number]      Weight:       199.3 lb Date of Birth:  10-12-1925       BSA:          2.084 m Patient Age:  89 years        BP:           124/64 mmHg Patient Gender: M               HR:           66 bpm. Exam Location:  [facility] Procedure: 2D Echo, Cardiac Doppler and Color Doppler Indications:     CHF-acute systolic I50.21  History:         Patient has prior history of Echocardiogram examinations, most                  recent 01/15/2020. CAD; Risk Factors:Hypertension.  Sonographer:     Cristela Blue Referring Phys:  [id number] Deno Lunger [facility] Diagnosing Phys: Sena Slate  Sonographer Comments: Suboptimal apical window. IMPRESSIONS  1. Left ventricular ejection fraction, by estimation, is 30 to 35%. The left ventricle has moderately decreased function. The left ventricle demonstrates  regional wall motion abnormalities (see scoring diagram/findings for description). Left ventricular  diastolic parameters are consistent with Grade II diastolic dysfunction (pseudonormalization).  2. Right ventricular systolic function is normal. The right ventricular size is not well visualized.  3. Left atrial size was severely dilated.  4. The mitral valve is normal in structure. Mild mitral valve regurgitation. No evidence of mitral stenosis.  5. The aortic valve is calcified. Aortic valve regurgitation is not visualized. Aortic valve sclerosis/calcification is present, without any evidence of aortic stenosis. Comparison(s): No significant change from prior study. FINDINGS  Left Ventricle: Left ventricular ejection fraction, by estimation, is 30 to 35%. The left ventricle has moderately decreased function. The left ventricle demonstrates regional wall motion abnormalities. Severe hypokinesis of the left ventricular, entire  apical segment and anteroseptal wall. The left ventricular internal cavity size was normal in size. There is no left ventricular hypertrophy. Left ventricular diastolic parameters are consistent with Grade II diastolic dysfunction (pseudonormalization). Right Ventricle: The right ventricular size is not well visualized. Right vetricular wall thickness was not well visualized. Right ventricular systolic function is normal. Left Atrium: Left atrial size was severely dilated. Right Atrium: Right atrial size was normal in size. Pericardium: There is no evidence of pericardial effusion. Mitral Valve: The mitral valve is normal in structure. Mild mitral valve regurgitation. No evidence of mitral valve stenosis. MV peak gradient, 6.1 mmHg. The mean mitral valve gradient is 2.0 mmHg. Tricuspid Valve: The tricuspid valve is not well visualized. Tricuspid valve regurgitation is trivial. Aortic Valve: The aortic valve is calcified. Aortic valve regurgitation is not visualized. Aortic valve  sclerosis/calcification is present, without any evidence of aortic stenosis. Aortic valve mean gradient measures 3.0 mmHg. Aortic valve peak gradient measures 5.3 mmHg. Aortic valve area, by VTI measures 1.69 cm. Pulmonic Valve: The pulmonic valve was not well visualized. Pulmonic valve regurgitation is mild. No evidence of pulmonic stenosis. Aorta: The aortic root and ascending aorta are structurally normal, with no evidence of dilitation. IAS/Shunts: The interatrial septum was not well visualized.  LEFT VENTRICLE PLAX 2D LVIDd:         5.00 cm      Diastology LVIDs:         4.10 cm      LV e' medial:    4.03 cm/s LV PW:         1.30 cm      LV E/e' medial:  21.3 LV IVS:        1.50 cm      LV e' lateral:  8.59 cm/s LVOT diam:     2.00 cm      LV E/e' lateral: 10.0 LV SV:         36 LV SV Index:   17 LVOT Area:     3.14 cm  LV Volumes (MOD) LV vol d, MOD A2C: 178.0 ml LV vol d, MOD A4C: 207.0 ml LV vol s, MOD A2C: 165.0 ml LV vol s, MOD A4C: 96.2 ml LV SV MOD A2C:     13.0 ml LV SV MOD A4C:     207.0 ml LV SV MOD BP:      57.3 ml RIGHT VENTRICLE RV S prime:     18.00 cm/s TAPSE (M-mode): 4.8 cm LEFT ATRIUM              Index        RIGHT ATRIUM           Index LA diam:        5.30 cm  2.54 cm/m   RA Area:     17.30 cm LA Vol (A2C):   101.0 ml 48.46 ml/m  RA Volume:   48.50 ml  23.27 ml/m LA Vol (A4C):   127.0 ml 60.93 ml/m LA Biplane Vol: 118.0 ml 56.62 ml/m  AORTIC VALVE                    PULMONIC VALVE AV Area (Vmax):    1.30 cm     PV Vmax:        0.78 m/s AV Area (Vmean):   1.29 cm     PV Vmean:       53.600 cm/s AV Area (VTI):     1.69 cm     PV VTI:         0.152 m AV Vmax:           114.67 cm/s  PV Peak grad:   2.4 mmHg AV Vmean:          76.567 cm/s  PV Mean grad:   1.5 mmHg AV VTI:            0.214 m      RVOT Peak grad: 4 mmHg AV Peak Grad:      5.3 mmHg AV Mean Grad:      3.0 mmHg LVOT Vmax:         47.60 cm/s LVOT Vmean:        31.500 cm/s LVOT VTI:          0.115 m LVOT/AV VTI ratio: 0.54   AORTA Ao Root diam: 2.93 cm MITRAL VALVE               TRICUSPID VALVE MV Area (PHT): 4.80 cm    TR Peak grad:   27.2 mmHg MV Area VTI:   1.02 cm    TR Vmax:        261.00 cm/s MV Peak grad:  6.1 mmHg MV Mean grad:  2.0 mmHg    SHUNTS MV Vmax:       1.23 m/s    Systemic VTI:  0.12 m MV Vmean:      61.9 cm/s   Systemic Diam: 2.00 cm MV Decel Time: 158 msec    Pulmonic VTI:  0.217 m MV E velocity: 85.70 cm/s MV A velocity: 99.40 cm/s MV E/A ratio:  0.86 Donnelly Angelica Electronically signed by Donnelly Angelica Signature Date/Time: 09/30/2021/12:59:07 PM    Final        Assessment &  Plan:   Problem List Items Addressed This Visit     Acute on chronic combined systolic and diastolic CHF (congestive heart failure) ([place])    Recently admitted with acute on chronic CHF.  ECHO - EF 30-35%, grade II DD, mild MR.  IV lasix in hospital and continued on oral lasix outpt.  Discussed daily weights.  Discussed diet.  Follow.        Acute respiratory failure with hypoxia ([place])    Discharged on oxygen.  Not wearing today.  Uses at home.  Have home health to evaluate continued oxygen need.        Anemia    Follow cbc.       Aortic aneurysm [facility])    Previously evaluated by Dr Genevive Bi.  Stable.  Recommended f/u prn.       Chronic systolic CHF (congestive heart failure), NYHA class 3 (HCC) - Primary   Relevant Orders   Basic metabolic panel (Completed)   Coronary artery disease involving native coronary artery of native heart without angina pectoris    Continue pravastatin and aspirin.  No chest pain.        Hypercholesterolemia    Continue pravastatin.  Low cholesterol diet and exercise.  Follow lipid panel and liver function tests.        Hyperglycemia    Low carb diet and exercise.  Follow met b and a1c.       Hypertension, benign    On lasix.  Pressures as outlined. Follow pressures.  Follow metabolic panel.        Lambert-Eaton myasthenic syndrome (HCC)    On cellcept.  Treated with IVIG in hospital.   Declined prednisone.        SOB (shortness of breath)    Breathing better.  Continue lasix.  Check metabolic panel.  Breathing is better.  Home PT to begin next week.          [name], MD

## 2021-10-14 ENCOUNTER — Telehealth: Payer: Self-pay

## 2021-10-14 NOTE — Telephone Encounter (Signed)
Attempted to call. No answer no voicemail. 

## 2021-10-18 DIAGNOSIS — I5023 Acute on chronic systolic (congestive) heart failure: Secondary | ICD-10-CM | POA: Diagnosis not present

## 2021-10-18 DIAGNOSIS — I10 Essential (primary) hypertension: Secondary | ICD-10-CM | POA: Diagnosis not present

## 2021-10-18 DIAGNOSIS — I2510 Atherosclerotic heart disease of native coronary artery without angina pectoris: Secondary | ICD-10-CM | POA: Diagnosis not present

## 2021-10-18 DIAGNOSIS — I6523 Occlusion and stenosis of bilateral carotid arteries: Secondary | ICD-10-CM | POA: Diagnosis not present

## 2021-10-19 ENCOUNTER — Encounter: Payer: Self-pay | Admitting: Internal Medicine

## 2021-10-19 NOTE — Assessment & Plan Note (Signed)
Continue pravastatin and aspirin.  No chest pain.

## 2021-10-19 NOTE — Assessment & Plan Note (Signed)
Previously evaluated by Dr Oaks.  Stable.  Recommended f/u prn.  

## 2021-10-19 NOTE — Assessment & Plan Note (Signed)
On cellcept.  Treated with IVIG in hospital.  Declined prednisone.

## 2021-10-19 NOTE — Assessment & Plan Note (Signed)
Follow cbc.  

## 2021-10-19 NOTE — Assessment & Plan Note (Addendum)
Breathing better.  Continue lasix.  Check metabolic panel.  Breathing is better.  Home PT to begin next week.

## 2021-10-19 NOTE — Assessment & Plan Note (Signed)
Low carb diet and exercise.  Follow met b and a1c.

## 2021-10-19 NOTE — Assessment & Plan Note (Signed)
On lasix.  Pressures as outlined. Follow pressures.  Follow metabolic panel.

## 2021-10-19 NOTE — Assessment & Plan Note (Signed)
Continue pravastatin.  Low cholesterol diet and exercise.  Follow lipid panel and liver function tests.   

## 2021-10-19 NOTE — Assessment & Plan Note (Signed)
Discharged on oxygen.  Not wearing today.  Uses at home.  Have home health to evaluate continued oxygen need.

## 2021-10-19 NOTE — Assessment & Plan Note (Signed)
Recently admitted with acute on chronic CHF.  ECHO - EF 30-35%, grade II DD, mild MR.  IV lasix in hospital and continued on oral lasix outpt.  Discussed daily weights.  Discussed diet.  Follow.

## 2021-10-21 ENCOUNTER — Telehealth: Payer: Self-pay | Admitting: Internal Medicine

## 2021-10-21 NOTE — Telephone Encounter (Signed)
Pt called in stating that he has an oxygen machine that he is not using and he called [phone] to pick it up but they said that his primary doctor has to release it

## 2021-10-22 NOTE — Telephone Encounter (Signed)
Pt called in stating that he needs to speak with you about an oxygen machine he has. Pt didn't give detail. Pt just would like to speak with you about the oxygen machine. Pt requesting callback

## 2021-10-23 NOTE — Telephone Encounter (Signed)
I think he is receiving home health.  Need them to confirm that oxygen is not needed - walk and document oxygen sats.

## 2021-10-23 NOTE — Telephone Encounter (Signed)
Confirmed with patient no longer using O2. Ok to give D/C order so they will come pick up?

## 2021-10-24 NOTE — Telephone Encounter (Signed)
Patient is no longer receiving home health. Do we need to set him up to do a walk?

## 2021-10-24 NOTE — Telephone Encounter (Signed)
Yes.  He needs a walk.

## 2021-10-29 NOTE — Telephone Encounter (Signed)
Walk scheduled

## 2021-10-30 ENCOUNTER — Ambulatory Visit (INDEPENDENT_AMBULATORY_CARE_PROVIDER_SITE_OTHER): Payer: Medicare Other

## 2021-10-30 ENCOUNTER — Other Ambulatory Visit: Payer: Self-pay

## 2021-10-30 DIAGNOSIS — I5022 Chronic systolic (congestive) heart failure: Secondary | ICD-10-CM

## 2021-10-30 NOTE — Progress Notes (Signed)
Patient came in today to have oxygen d/c. Completed 5 min walk with patient. Resting O2 sat 98% room air. Ambulatory O2 sat 93%-94%. Patient showed no signs of acute distress. Ok to send order to D/C oxygen.?

## 2021-10-30 NOTE — Progress Notes (Signed)
Ok to send order to d/c oxygen.

## 2021-11-01 ENCOUNTER — Telehealth: Payer: Self-pay

## 2021-11-01 DIAGNOSIS — I2510 Atherosclerotic heart disease of native coronary artery without angina pectoris: Secondary | ICD-10-CM

## 2021-11-01 DIAGNOSIS — G7080 Lambert-Eaton syndrome, unspecified: Secondary | ICD-10-CM | POA: Diagnosis not present

## 2021-11-01 DIAGNOSIS — K6289 Other specified diseases of anus and rectum: Secondary | ICD-10-CM

## 2021-11-01 DIAGNOSIS — E782 Mixed hyperlipidemia: Secondary | ICD-10-CM

## 2021-11-01 DIAGNOSIS — N411 Chronic prostatitis: Secondary | ICD-10-CM

## 2021-11-01 DIAGNOSIS — C44601 Unspecified malignant neoplasm of skin of unspecified upper limb, including shoulder: Secondary | ICD-10-CM

## 2021-11-01 DIAGNOSIS — I110 Hypertensive heart disease with heart failure: Secondary | ICD-10-CM | POA: Diagnosis not present

## 2021-11-01 DIAGNOSIS — Z7982 Long term (current) use of aspirin: Secondary | ICD-10-CM

## 2021-11-01 DIAGNOSIS — I890 Lymphedema, not elsewhere classified: Secondary | ICD-10-CM

## 2021-11-01 DIAGNOSIS — J9601 Acute respiratory failure with hypoxia: Secondary | ICD-10-CM

## 2021-11-01 DIAGNOSIS — I5043 Acute on chronic combined systolic (congestive) and diastolic (congestive) heart failure: Secondary | ICD-10-CM | POA: Diagnosis not present

## 2021-11-01 DIAGNOSIS — N400 Enlarged prostate without lower urinary tract symptoms: Secondary | ICD-10-CM

## 2021-11-01 DIAGNOSIS — I6523 Occlusion and stenosis of bilateral carotid arteries: Secondary | ICD-10-CM

## 2021-11-01 DIAGNOSIS — I719 Aortic aneurysm of unspecified site, without rupture: Secondary | ICD-10-CM

## 2021-11-01 DIAGNOSIS — C44300 Unspecified malignant neoplasm of skin of unspecified part of face: Secondary | ICD-10-CM

## 2021-11-01 DIAGNOSIS — I081 Rheumatic disorders of both mitral and tricuspid valves: Secondary | ICD-10-CM

## 2021-11-01 DIAGNOSIS — G7000 Myasthenia gravis without (acute) exacerbation: Secondary | ICD-10-CM | POA: Diagnosis not present

## 2021-11-01 DIAGNOSIS — C679 Malignant neoplasm of bladder, unspecified: Secondary | ICD-10-CM

## 2021-11-01 DIAGNOSIS — J9602 Acute respiratory failure with hypercapnia: Secondary | ICD-10-CM

## 2021-11-01 DIAGNOSIS — Z9981 Dependence on supplemental oxygen: Secondary | ICD-10-CM

## 2021-11-01 NOTE — Telephone Encounter (Signed)
D/C oxygen order printed, signed and faxed. Patient requesting handicap form. Also requesting rx for cushion for chair due to rectal irritation. DME printed.

## 2021-11-12 ENCOUNTER — Ambulatory Visit: Payer: Medicare Other | Admitting: Family

## 2021-11-19 ENCOUNTER — Ambulatory Visit (INDEPENDENT_AMBULATORY_CARE_PROVIDER_SITE_OTHER): Payer: Medicare Other

## 2021-11-19 VITALS — Ht 70.0 in | Wt 200.0 lb

## 2021-11-19 DIAGNOSIS — Z0000 Encounter for general adult medical examination without abnormal findings: Secondary | ICD-10-CM | POA: Diagnosis not present

## 2021-11-19 NOTE — Patient Instructions (Addendum)
[name] , Thank you for taking time to come for your Medicare Wellness Visit. I appreciate your ongoing commitment to your health goals. Please review the following plan we discussed and let me know if I can assist you in the future.   These are the goals we discussed:  Goals      Follow up with Primary Care Provider     As needed.        This is a list of the screening recommended for you and due dates:  Health Maintenance  Topic Date Due   COVID-19 Vaccine (5 - Booster for Pfizer series) 12/05/2021*   Flu Shot  12/27/2021*   Zoster (Shingles) Vaccine (1 of 2) 01/09/2022*   Tetanus Vaccine  10/28/2028   Pneumonia Vaccine  Completed   HPV Vaccine  Aged Out  *Topic was postponed. The date shown is not the original due date.    Advanced directives: End of life planning; Advance aging; Advanced directives discussed.  Copy of current HCPOA/Living Will requested.    Conditions/risks identified: none new  Follow up in one year for your annual wellness visit.   Preventive Care 45 Years and Older, Male Preventive care refers to lifestyle choices and visits with your health care provider that can promote health and wellness. What does preventive care include? A yearly physical exam. This is also called an annual well check. Dental exams once or twice a year. Routine eye exams. Ask your health care provider how often you should have your eyes checked. Personal lifestyle choices, including: Daily care of your teeth and gums. Regular physical activity. Eating a healthy diet. Avoiding tobacco and drug use. Limiting alcohol use. Practicing safe sex. Taking low doses of aspirin every day. Taking vitamin and mineral supplements as recommended by your health care provider. What happens during an annual well check? The services and screenings done by your health care provider during your annual well check will depend on your age, overall health, lifestyle risk factors, and family  history of disease. Counseling  Your health care provider may ask you questions about your: Alcohol use. Tobacco use. Drug use. Emotional well-being. Home and relationship well-being. Sexual activity. Eating habits. History of falls. Memory and ability to understand (cognition). Work and work Statistician. Screening  You may have the following tests or measurements: Height, weight, and BMI. Blood pressure. Lipid and cholesterol levels. These may be checked every 5 years, or more frequently if you are over 32 years old. Skin check. Lung cancer screening. You may have this screening every year starting at age 65 if you have a 30-pack-year history of smoking and currently smoke or have quit within the past 15 years. Fecal occult blood test (FOBT) of the stool. You may have this test every year starting at age 53. Flexible sigmoidoscopy or colonoscopy. You may have a sigmoidoscopy every 5 years or a colonoscopy every 10 years starting at age 41. Prostate cancer screening. Recommendations will vary depending on your family history and other risks. Hepatitis C blood test. Hepatitis B blood test. Sexually transmitted disease (STD) testing. Diabetes screening. This is done by checking your blood sugar (glucose) after you have not eaten for a while (fasting). You may have this done every 1-3 years. Abdominal aortic aneurysm (AAA) screening. You may need this if you are a current or former smoker. Osteoporosis. You may be screened starting at age 65 if you are at high risk. Talk with your health care provider about your test results, treatment  options, and if necessary, the need for more tests. Vaccines  Your health care provider may recommend certain vaccines, such as: Influenza vaccine. This is recommended every year. Tetanus, diphtheria, and acellular pertussis (Tdap, Td) vaccine. You may need a Td booster every 10 years. Zoster vaccine. You may need this after age 57. Pneumococcal  13-valent conjugate (PCV13) vaccine. One dose is recommended after age 61. Pneumococcal polysaccharide (PPSV23) vaccine. One dose is recommended after age 64. Talk to your health care provider about which screenings and vaccines you need and how often you need them. This information is not intended to replace advice given to you by your health care provider. Make sure you discuss any questions you have with your health care provider. Document Released: 10/12/2015 Document Revised: 06/04/2016 Document Reviewed: 07/17/2015 Elsevier Interactive Patient Education  2017 [place] Prevention in the Home Falls can cause injuries. They can happen to people of all ages. There are many things you can do to make your home safe and to help prevent falls. What can I do on the outside of my home? Regularly fix the edges of walkways and driveways and fix any cracks. Remove anything that might make you trip as you walk through a door, such as a raised step or threshold. Trim any bushes or trees on the path to your home. Use bright outdoor lighting. Clear any walking paths of anything that might make someone trip, such as rocks or tools. Regularly check to see if handrails are loose or broken. Make sure that both sides of any steps have handrails. Any raised decks and porches should have guardrails on the edges. Have any leaves, snow, or ice cleared regularly. Use sand or salt on walking paths during winter. Clean up any spills in your garage right away. This includes oil or grease spills. What can I do in the bathroom? Use night lights. Install grab bars by the toilet and in the tub and shower. Do not use towel bars as grab bars. Use non-skid mats or decals in the tub or shower. If you need to sit down in the shower, use a plastic, non-slip stool. Keep the floor dry. Clean up any water that spills on the floor as soon as it happens. Remove soap buildup in the tub or shower regularly. Attach  bath mats securely with double-sided non-slip rug tape. Do not have throw rugs and other things on the floor that can make you trip. What can I do in the bedroom? Use night lights. Make sure that you have a light by your bed that is easy to reach. Do not use any sheets or blankets that are too big for your bed. They should not hang down onto the floor. Have a firm chair that has side arms. You can use this for support while you get dressed. Do not have throw rugs and other things on the floor that can make you trip. What can I do in the kitchen? Clean up any spills right away. Avoid walking on wet floors. Keep items that you use a lot in easy-to-reach places. If you need to reach something above you, use a strong step stool that has a grab bar. Keep electrical cords out of the way. Do not use floor polish or wax that makes floors slippery. If you must use wax, use non-skid floor wax. Do not have throw rugs and other things on the floor that can make you trip. What can I do with my stairs? Do not  leave any items on the stairs. Make sure that there are handrails on both sides of the stairs and use them. Fix handrails that are broken or loose. Make sure that handrails are as long as the stairways. Check any carpeting to make sure that it is firmly attached to the stairs. Fix any carpet that is loose or worn. Avoid having throw rugs at the top or bottom of the stairs. If you do have throw rugs, attach them to the floor with carpet tape. Make sure that you have a light switch at the top of the stairs and the bottom of the stairs. If you do not have them, ask someone to add them for you. What else can I do to help prevent falls? Wear shoes that: Do not have high heels. Have rubber bottoms. Are comfortable and fit you well. Are closed at the toe. Do not wear sandals. If you use a stepladder: Make sure that it is fully opened. Do not climb a closed stepladder. Make sure that both sides of the  stepladder are locked into place. Ask someone to hold it for you, if possible. Clearly mark and make sure that you can see: Any grab bars or handrails. First and last steps. Where the edge of each step is. Use tools that help you move around (mobility aids) if they are needed. These include: Canes. Walkers. Scooters. Crutches. Turn on the lights when you go into a dark area. Replace any light bulbs as soon as they burn out. Set up your furniture so you have a clear path. Avoid moving your furniture around. If any of your floors are uneven, fix them. If there are any pets around you, be aware of where they are. Review your medicines with your doctor. Some medicines can make you feel dizzy. This can increase your chance of falling. Ask your doctor what other things that you can do to help prevent falls. This information is not intended to replace advice given to you by your health care provider. Make sure you discuss any questions you have with your health care provider. Document Released: 07/12/2009 Document Revised: 02/21/2016 Document Reviewed: 10/20/2014 Elsevier Interactive Patient Education  2017 [employer].

## 2021-11-19 NOTE — Progress Notes (Signed)
Subjective:   [name] is a 88 y.o. male who presents for Medicare Annual/Subsequent preventive examination.  Review of Systems    No ROS.  Medicare Wellness Virtual Visit.  Visual/audio telehealth visit, UTA vital signs.   See social history for additional risk factors.   Cardiac Risk Factors include: advanced age (>43men, >61 women);male gender     Objective:    Today's Vitals   11/19/21 1330  Weight: 200 lb (90.7 kg)  Height: 5\' 10"  (1.778 m)   Body mass index is 28.7 kg/m.  Advanced Directives 11/19/2021 09/28/2021 01/01/2021 11/16/2020 01/13/2020 01/13/2020 05/10/2019  Does Patient Have a Medical Advance Directive? Yes No Yes;No No No Yes Yes  Type of Academic librarian;Living will - - - - - Press photographer;Living will  Does patient want to make changes to medical advance directive? No - Patient declined - - - No - Patient declined - No - Patient declined  Copy of [place] in Chart? No - copy requested - - - - - No - copy requested  Would patient like information on creating a medical advance directive? - - - No - Patient declined No - Patient declined - -    Current Medications (verified) Outpatient Encounter Medications as of 11/19/2021  Medication Sig   Acetaminophen (ARTHRITIS PAIN PO) Take 650 mg by mouth.   aspirin 81 MG tablet Take 81 mg by mouth daily.   finasteride (PROSCAR) 5 MG tablet Take 5 mg by mouth daily.   hydroxypropyl methylcellulose / hypromellose (ISOPTO TEARS / GONIOVISC) 2.5 % ophthalmic solution Place 1 drop into both eyes daily.   mycophenolate (CELLCEPT) 250 MG capsule Take 1,000 mg by mouth 2 (two) times daily.    pravastatin (PRAVACHOL) 20 MG tablet Take 1 tablet (20 mg total) by mouth daily.   prednisoLONE acetate (PRED FORTE) 1 % ophthalmic suspension Place 1 drop into the right eye daily. Per patient.   pyridostigmine (MESTINON) 60 MG tablet Take 30 mg by mouth 6 (six) times  daily.    tacrolimus (PROTOPIC) 0.1 % ointment Apply topically 2 (two) times daily.   tamsulosin (FLOMAX) 0.4 MG CAPS Take 0.4 mg by mouth daily.   furosemide (LASIX) 40 MG tablet Take 1 tablet (40 mg total) by mouth daily.   No facility-administered encounter medications on file as of 11/19/2021.    Allergies (verified) Beta adrenergic blockers, Calcium channel blockers, Ciprofloxacin, and Sulfa antibiotics   History: Past Medical History:  Diagnosis Date   Abnormal chest CT 03/28/2017   Allergy    Anemia    Angina pectoris ([place]) 04/02/2011   Aortic aneurysm ([place]) 03/28/2017   Saw Dr Genevive Bi 04/16/17 - recommended f/u CT in 3 months.     Atherosclerosis of both carotid arteries 07/17/2014   Benign lipomatous neoplasm of skin and subcutaneous tissue of left arm 10/04/2013   Bilateral carotid artery stenosis 08/30/2014   Overview:  Less than 50% 2015   Bladder cancer ([place]) 03/05/2013   CAD (coronary artery disease) 06/18/2014   Cancer ([place]) 11-24-12   bladder. BCG treatments by Dr Jacqlyn Larsen.   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 WD SCC. Re-shaved 01/14/2016. Excised 02/05/2016, margins free.   Squamous cell carcinoma of skin 11/03/2018   Right cheek ant. to  sideburn. Poorly differentiated.   Squamous cell carcinoma of skin 02/02/2019   Right mid dorsum forearm. WD SCC. EDC.   Past Surgical History:  Procedure Laterality Date   [place]   Family History  Problem Relation Age of Onset   Lung cancer Sister    Prostate cancer Brother    Lung cancer Brother    Arthritis Other        parent   Colon cancer Neg Hx    Social History   Socioeconomic History   Marital status: Widowed    Spouse name: Not on file   Number of children: Not on file   Years of education: Not on file   Highest education level: Not on file  Occupational History   Not on file  Tobacco Use   Smoking status: Former    Types: Cigarettes    Quit date: 09/29/1958    Years since quitting: 63.1   Smokeless tobacco: Never  Vaping Use   Vaping Use: Never used  Substance and Sexual Activity   Alcohol use: Yes    Alcohol/week: 0.0 standard drinks    Comment: occasionally   Drug use: No   Sexual activity: Never  Other Topics Concern   Not on file  Social History Narrative   Pt is married with 2 children - 1 son, 1 daughter. Previously self-employed in [place] Strain: Low Risk    Difficulty of Paying Living Expenses: Not hard at all  Food Insecurity: No Food Insecurity   Worried About Charity fundraiser in the Last Year: Never true   Arboriculturist in the Last Year: Never true  Transportation Needs: No Transportation Needs   Lack of Transportation (Medical): No   Lack of Transportation (Non-Medical): No  Physical Activity: Not on file  Stress: No Stress Concern Present   Feeling of Stress : Not at all  Social Connections: Unknown   Frequency of Communication with Friends and Family: More than three times a week   Frequency of Social Gatherings with Friends and Family: More than three times a week   Attends Religious  Services: More than 4 times per year   Active Member of [employer] or Organizations: Not on file   Attends Archivist Meetings: Not on file   Marital Status: Widowed    Tobacco Counseling Counseling given: Not Answered   Clinical Intake:  Pre-visit preparation completed: Yes        Diabetes: No  How often do you need to have someone help you when you read instructions, pamphlets, or other written materials from your doctor or pharmacy?: 1 - Never   Interpreter Needed?: No      Activities of Daily Living In your present state of health,  do you have any difficulty performing the following activities: 11/19/2021 09/30/2021  Hearing? N Y  Vision? N N  Difficulty concentrating or making decisions? N N  Walking or climbing stairs? N Y  Comment Cane in use. Paces self. -  Dressing or bathing? N Y  Doing errands, shopping? N Y  Conservation officer, nature and eating ? N -  Using the Toilet? N -  In the past six months, have you accidently leaked urine? Y -  Comment Managed with daily pad. Followed by Urology. -  Do you have problems with loss of bowel control? N -  Managing your Medications? N -  Managing your Finances? N -  Housekeeping or managing your Housekeeping? N -  Some recent data might be hidden    Patient Care Team: [name], MD as PCP - General (Internal Medicine) [name], MD (Internal Medicine) Bary Castilla Forest Gleason, MD (General Surgery)  Indicate any recent Medical Services you may have received from other than [facility] providers in the past year (date may be approximate).     Assessment:   This is a routine wellness examination for [place].  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 Sandra Cockayne  Followed by [facility] Frequent office visits due to Shingles in the R eye  L cataract  removed  Wears glasses  Dietary issues and exercise activities discussed: Current Exercise Habits: Home exercise routine, Intensity: Mild   Goals Addressed             This Visit's Progress    Follow up with Primary Care Provider       As needed.       Depression Screen PHQ 2/9 Scores 11/19/2021 06/04/2021 02/22/2021 11/16/2020 07/18/2020 05/10/2019 09/09/2016  PHQ - 2 Score 0 0 0 0 0 0 0    Fall Risk Fall Risk  11/19/2021 06/04/2021 02/22/2021 12/03/2020 11/16/2020  Falls in the past year? 0 0 0 0 0  Number falls in past yr: 0 0 0 0 0  Injury with Fall? - 0 0 0 0  Risk for fall due to : Impaired balance/gait - - - -  Follow up Falls evaluation completed Falls evaluation completed;Falls prevention discussed Falls evaluation completed Falls evaluation completed Falls evaluation completed    FALL RISK PREVENTION PERTAINING TO THE HOME: Home free of loose throw rugs in walkways, pet beds, electrical cords, etc? Yes  Adequate lighting in your home to reduce risk of falls? Yes   ASSISTIVE DEVICES UTILIZED TO PREVENT FALLS: Use of a cane, walker or w/c? Yes   TIMED UP AND GO: Was the test performed? No .   Cognitive Function: MMSE - Mini Mental State Exam 09/09/2016 09/10/2015  Orientation to time 5 5  Orientation to Place 5 5  Registration 3 3  Attention/ Calculation 5 5  Recall 3 3  Language- name 2 objects 2 2  Language- repeat 1 1  Language- follow 3 step command 3 3  Language- read & follow direction 1 1  Write a sentence 1 1  Copy design 1 1  Total score 30 30     6CIT Screen 11/19/2021 11/16/2020 05/10/2019  What Year? 0 points 0 points 0 points  What month? 0 points 0 points 0 points  What time? 0 points 0 points 0 points  Count back from 20 0 points 0  points 0 points  Months in reverse 0 points - 0 points    Immunizations Immunization History  Administered Date(s) Administered   Fluad Quad(high Dose 65+) 06/30/2019, 07/16/2020   Influenza Split 06/29/2013    Influenza, High Dose Seasonal PF 06/24/2016, 06/29/2017, 07/07/2018   Influenza,inj,Quad PF,6+ Mos 07/26/2014   Influenza-Unspecified 06/29/2009, 06/29/2010, 07/09/2012, 07/06/2015, 06/29/2016, 06/22/2017, 08/11/2018, 06/30/2019   PFIZER(Purple Top)SARS-COV-2 Vaccination 10/22/2019, 11/12/2019, 08/13/2020, 09/03/2021   Pneumococcal Conjugate-13 10/02/2015   Pneumococcal Polysaccharide-23 07/08/2017   Tdap 10/28/2018    Screening Tests Health Maintenance  Topic Date Due   COVID-19 Vaccine (5 - Booster for [place] series) 12/05/2021 (Originally 10/29/2021)   INFLUENZA VACCINE  12/27/2021 (Originally 04/29/2021)   Zoster Vaccines- Shingrix (1 of 2) 01/09/2022 (Originally 09/05/1945)   TETANUS/TDAP  10/28/2028   Pneumonia Vaccine 54+ Years old  Completed   HPV VACCINES  Aged Out   Health Maintenance There are no preventive care reminders to display for this patient.  Lung Cancer Screening: (Low Dose CT Chest recommended if Age 81-80 years, 30 pack-year currently smoking OR have quit w/in 15years.) does not qualify.   Hepatitis C Screening: does not qualify  Vision Screening: Recommended annual ophthalmology exams for early detection of glaucoma and other disorders of the eye.  Dental Screening: Recommended annual dental exams for proper oral hygiene  Community Resource Referral / Chronic Care Management: CRR required this visit?  No   CCM required this visit?  No      Plan:   Keep all routine maintenance appointments.   I have personally reviewed and noted the following in the patients chart:   Medical and social history Use of alcohol, tobacco or illicit drugs  Current medications and supplements including opioid prescriptions. Patient is not currently taking opioid prescriptions. Functional ability and status Nutritional status Physical activity Advanced directives List of other physicians Hospitalizations, surgeries, and ER visits in previous 12 months Vitals Screenings  to include cognitive, depression, and falls Referrals and appointments  In addition, I have reviewed and discussed with patient certain preventive protocols, quality metrics, and best practice recommendations. A written personalized care plan for preventive services as well as general preventive health recommendations were provided to patient via mychart.     Varney Biles, LPN   [id number]/54/[id number]

## 2021-11-25 ENCOUNTER — Telehealth: Payer: Self-pay | Admitting: Internal Medicine

## 2021-11-25 NOTE — Telephone Encounter (Signed)
Pt daughter called in stating that Pt is requesting for you to contact him. Pt daughter stated that its in regards to Pt hemorrhoid. Pt daughter stated to call pt back at [phone]

## 2021-11-25 NOTE — Telephone Encounter (Addendum)
Patient said he had heavy rectal bleeding on Friday after doing some heavy lifting on Thursday, patient stated you treated him once before sent him to GI stated it was external hemorrhoids. Patient say twice to day with bleeding today he can see in his pad. No pain at rectum some stinging, but stated the bleeding is bright red , advised he needs to be evaluated due to filled his urinary continence pad and into his underwear on Friday, better today. Patient says he feels coming from external hemorrhoids refuses UC or ED, stated he believes same as last time . Patient promised bleeding at this time just smear on pad but still red, he did promise if bleeding worsens before appointment will go to ED or UC.  I scheduled for Wednesday at 12:30.

## 2021-11-27 ENCOUNTER — Ambulatory Visit: Payer: Medicare Other | Admitting: Internal Medicine

## 2021-11-27 ENCOUNTER — Other Ambulatory Visit: Payer: Self-pay

## 2021-11-27 VITALS — BP 126/68 | HR 68 | Temp 97.5°F | Resp 16 | Ht 70.0 in | Wt 204.0 lb

## 2021-11-27 DIAGNOSIS — I5022 Chronic systolic (congestive) heart failure: Secondary | ICD-10-CM

## 2021-11-27 DIAGNOSIS — I872 Venous insufficiency (chronic) (peripheral): Secondary | ICD-10-CM

## 2021-11-27 DIAGNOSIS — K625 Hemorrhage of anus and rectum: Secondary | ICD-10-CM | POA: Diagnosis not present

## 2021-11-27 DIAGNOSIS — I10 Essential (primary) hypertension: Secondary | ICD-10-CM

## 2021-11-27 LAB — CBC WITH DIFFERENTIAL/PLATELET
Basophils Absolute: 0 10*3/uL (ref 0.0–0.1)
Basophils Relative: 0.5 % (ref 0.0–3.0)
Eosinophils Absolute: 0 10*3/uL (ref 0.0–0.7)
Eosinophils Relative: 0.8 % (ref 0.0–5.0)
HCT: 42.6 % (ref 39.0–52.0)
Hemoglobin: 13.4 g/dL (ref 13.0–17.0)
Lymphocytes Relative: 20.6 % (ref 12.0–46.0)
Lymphs Abs: 1.3 10*3/uL (ref 0.7–4.0)
MCHC: 31.5 g/dL (ref 30.0–36.0)
MCV: 92.7 fl (ref 78.0–100.0)
Monocytes Absolute: 0.6 10*3/uL (ref 0.1–1.0)
Monocytes Relative: 9.2 % (ref 3.0–12.0)
Neutro Abs: 4.2 10*3/uL (ref 1.4–7.7)
Neutrophils Relative %: 68.9 % (ref 43.0–77.0)
Platelets: 187 10*3/uL (ref 150.0–400.0)
RBC: 4.6 Mil/uL (ref 4.22–5.81)
RDW: 16.8 % — ABNORMAL HIGH (ref 11.5–15.5)
WBC: 6.1 10*3/uL (ref 4.0–10.5)

## 2021-11-27 MED ORDER — HYDROCORTISONE ACETATE 25 MG RE SUPP: 25.0000 mg | Freq: Two times a day (BID) | RECTAL | 0 refills | Status: DC

## 2021-11-27 NOTE — Progress Notes (Signed)
Patient ID: [name], male   DOB: 08-06-1926, 88 y.o.   MRN: [id number]   Subjective:    Patient ID: [name], male    DOB: [birth year], 88 y.o.   MRN: [id number]  This visit occurred during the SARS-CoV-2 public health emergency.  Safety protocols were in place, including screening questions prior to the visit, additional usage of staff PPE, and extensive cleaning of exam room while observing appropriate contact time as indicated for disinfecting solutions.   Patient here for work in [employer] Complaint  Patient presents with   Hemorrhoids   .   HPI Work in - noticed rectal bleeding.  Reports that he noticed rectal bleeding - when went to urinate.  Since he has had bladder issues, he reports he has to sit down to urinate.  On 11/22/21 and 11/23/21 - noticed when urinated - rectal bleeding - red blood in the toilet water.  No bleeding at other times.  Noticed very minimal amount 11/24/21  and on 11/25/21 - noticed minimal blood with wiping (only).  No blood since.  No abdominal pain.  No fever.  Eating.  Good appetite.  No nausea or vomiting.  Has had an episode previously of rectal bleeding.  Saw Dr Bary Castilla.  Felt to be related to hemorrhoid.  Treated with anusol suppositories and has had no further problems until this recent episode.  No dizziness or light headedness reported.    Past Medical History:  Diagnosis Date   Abnormal chest CT 03/28/2017   Allergy    Anemia    Angina pectoris ([place]) 04/02/2011   Aortic aneurysm ([place]) 03/28/2017   Saw Dr Genevive Bi 04/16/17 - recommended f/u CT in 3 months.     Atherosclerosis of both carotid arteries 07/17/2014   Benign lipomatous neoplasm of skin and subcutaneous tissue of left arm 10/04/2013   Bilateral carotid artery stenosis 08/30/2014   Overview:  Less than 50% 2015   Bladder cancer ([place]) 03/05/2013   CAD (coronary artery disease) 06/18/2014   Cancer ([place]) 11-24-12   bladder. BCG treatments by Dr Jacqlyn Larsen.   Chicken pox    Chronic prostatitis  09/30/2012   Colon polyp    Congestive heart failure (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 WD SCC. Re-shaved 01/14/2016. Excised 02/05/2016, margins free.   Squamous cell carcinoma of skin 11/03/2018   Right cheek ant. to sideburn. Poorly differentiated.   Squamous cell carcinoma of skin 02/02/2019   Right mid dorsum forearm. WD SCC. EDC.   Past Surgical History:  Procedure Laterality Date   [name]   Family History  Problem Relation Age of Onset   Lung cancer Sister    Prostate cancer Brother    Lung cancer Brother  Arthritis Other        parent   Colon cancer Neg Hx    Social History   Socioeconomic History   Marital status: Widowed    Spouse name: Not on file   Number of children: Not on file   Years of education: Not on file   Highest education level: Not on file  Occupational History   Not on file  Tobacco Use   Smoking status: Former    Types: Cigarettes    Quit date: 09/29/1958    Years since quitting: 63.2   Smokeless tobacco: Never  Vaping Use   Vaping Use: Never used  Substance and Sexual Activity   Alcohol use: Yes    Alcohol/week: 0.0 standard drinks    Comment: occasionally   Drug use: No   Sexual activity: Never  Other  Topics Concern   Not on file  Social History Narrative   Pt is married with 2 children - 1 son, 1 daughter. Previously self-employed in [place] Strain: Low Risk    Difficulty of Paying Living Expenses: Not hard at all  Food Insecurity: No Food Insecurity   Worried About Charity fundraiser in the Last Year: Never true   Arboriculturist in the Last Year: Never true  Transportation Needs: No Transportation Needs   Lack of Transportation (Medical): No   Lack of Transportation (Non-Medical): No  Physical Activity: Not on file  Stress: No Stress Concern Present   Feeling of Stress : Not at all  Social Connections: Unknown   Frequency of Communication with Friends and Family: More than three times a week   Frequency of Social Gatherings with Friends and Family: More than three times a week   Attends Religious Services: More than 4 times per year   Active Member of [employer] or Organizations: Not on file   Attends Archivist Meetings: Not on file   Marital Status: Widowed     Review of Systems  Constitutional:  Negative for appetite change and fever.  HENT:  Negative for congestion and sinus pressure.   Respiratory:  Negative for cough and chest tightness.        Breathing stable.   Cardiovascular:  Negative for chest pain and palpitations.       No increased leg swelling.   Gastrointestinal:  Negative for abdominal pain, nausea and vomiting.       Rectal bleeding as outlined.   Genitourinary:  Negative for dysuria and hematuria.  Musculoskeletal:  Negative for joint swelling and myalgias.  Skin:  Negative for color change and rash.  Neurological:  Negative for dizziness, light-headedness and headaches.  Psychiatric/Behavioral:  Negative for agitation and dysphoric mood.       Objective:     BP 126/68    Pulse 68    Temp (!) 97.5 F (36.4 C)    Resp 16    Ht 5\' 10"  (1.778 m)    Wt 204 lb (92.5 kg)    SpO2 97%     BMI 29.27 kg/m  Wt Readings from Last 3 Encounters:  11/27/21 204 lb (92.5 kg)  11/19/21 200 lb (90.7 kg)  10/11/21 200 lb (90.7 kg)    Physical Exam Vitals reviewed.  Constitutional:      General: He is not in acute distress.    Appearance: Normal appearance. He is well-developed.  HENT:     Head: Normocephalic and atraumatic.  Right Ear: External ear normal.     Left Ear: External ear normal.  Eyes:     General: No scleral icterus.       Right eye: No discharge.        Left eye: No discharge.     Conjunctiva/sclera: Conjunctivae normal.  Cardiovascular:     Rate and Rhythm: Normal rate and regular rhythm.  Pulmonary:     Effort: Pulmonary effort is normal. No respiratory distress.     Breath sounds: Normal breath sounds.  Abdominal:     General: Bowel sounds are normal.     Palpations: Abdomen is soft.     Tenderness: There is no abdominal tenderness.  Genitourinary:    Comments: Rectal exam:  heme positive.  No mass palpated.   Musculoskeletal:        General: No tenderness.     Cervical back: Neck supple. No tenderness.     Comments: No increased swelling.  Compression hose in place.   Lymphadenopathy:     Cervical: No cervical adenopathy.  Skin:    Findings: No erythema or rash.  Neurological:     Mental Status: He is alert.  Psychiatric:        Mood and Affect: Mood normal.        Behavior: Behavior normal.     Outpatient Encounter Medications as of 11/27/2021  Medication Sig   hydrocortisone (ANUSOL-HC) 25 MG suppository Place 1 suppository (25 mg total) rectally 2 (two) times daily.   Acetaminophen (ARTHRITIS PAIN PO) Take 650 mg by mouth.   aspirin 81 MG tablet Take 81 mg by mouth daily.   finasteride (PROSCAR) 5 MG tablet Take 5 mg by mouth daily.   furosemide (LASIX) 40 MG tablet Take 1 tablet (40 mg total) by mouth daily.   hydroxypropyl methylcellulose / hypromellose (ISOPTO TEARS / GONIOVISC) 2.5 % ophthalmic solution Place 1 drop into both  eyes daily.   mycophenolate (CELLCEPT) 250 MG capsule Take 1,000 mg by mouth 2 (two) times daily.    pravastatin (PRAVACHOL) 20 MG tablet Take 1 tablet (20 mg total) by mouth daily.   prednisoLONE acetate (PRED FORTE) 1 % ophthalmic suspension Place 1 drop into the right eye daily. Per patient.   pyridostigmine (MESTINON) 60 MG tablet Take 30 mg by mouth 6 (six) times daily.    tacrolimus (PROTOPIC) 0.1 % ointment Apply topically 2 (two) times daily.   tamsulosin (FLOMAX) 0.4 MG CAPS Take 0.4 mg by mouth daily.   No facility-administered encounter medications on file as of 11/27/2021.     Lab Results  Component Value Date   WBC 6.1 11/27/2021   HGB 13.4 11/27/2021   HCT 42.6 11/27/2021   PLT 187.0 11/27/2021   GLUCOSE 102 (H) 10/11/2021   CHOL 140 02/22/2021   TRIG 121.0 02/22/2021   HDL 44.70 02/22/2021   LDLDIRECT 143.5 08/26/2013   LDLCALC 71 02/22/2021   ALT 21 09/29/2021   AST 23 09/29/2021   NA 139 10/11/2021   K 4.0 10/11/2021   CL 97 10/11/2021   CREATININE 1.10 10/11/2021   BUN 28 (H) 10/11/2021   CO2 34 (H) 10/11/2021   TSH 1.543 09/29/2021   INR 1.1 09/29/2021   HGBA1C 5.9 02/22/2021    ECHOCARDIOGRAM COMPLETE  Result Date: 09/30/2021    ECHOCARDIOGRAM REPORT   Patient Name:   [name] Date of Exam: 09/30/2021 Medical Rec #:  [id number]       Height:  70.0 in Accession #:    [id number]      Weight:       199.3 lb Date of Birth:  July 27, 1926       BSA:          2.084 m Patient Age:    67 years        BP:           124/64 mmHg Patient Gender: M               HR:           66 bpm. Exam Location:  [facility] Procedure: 2D Echo, Cardiac Doppler and Color Doppler Indications:     CHF-acute systolic Y50.35  History:         Patient has prior history of Echocardiogram examinations, most                  recent 01/15/2020. CAD; Risk Factors:Hypertension.  Sonographer:     Sherrie Sport Referring Phys:  [id number] [place] Diagnosing Phys: Donnelly Angelica  Sonographer Comments:  Suboptimal apical window. IMPRESSIONS  1. Left ventricular ejection fraction, by estimation, is 30 to 35%. The left ventricle has moderately decreased function. The left ventricle demonstrates regional wall motion abnormalities (see scoring diagram/findings for description). Left ventricular  diastolic parameters are consistent with Grade II diastolic dysfunction (pseudonormalization).  2. Right ventricular systolic function is normal. The right ventricular size is not well visualized.  3. Left atrial size was severely dilated.  4. The mitral valve is normal in structure. Mild mitral valve regurgitation. No evidence of mitral stenosis.  5. The aortic valve is calcified. Aortic valve regurgitation is not visualized. Aortic valve sclerosis/calcification is present, without any evidence of aortic stenosis. Comparison(s): No significant change from prior study. FINDINGS  Left Ventricle: Left ventricular ejection fraction, by estimation, is 30 to 35%. The left ventricle has moderately decreased function. The left ventricle demonstrates regional wall motion abnormalities. Severe hypokinesis of the left ventricular, entire  apical segment and anteroseptal wall. The left ventricular internal cavity size was normal in size. There is no left ventricular hypertrophy. Left ventricular diastolic parameters are consistent with Grade II diastolic dysfunction (pseudonormalization). Right Ventricle: The right ventricular size is not well visualized. Right vetricular wall thickness was not well visualized. Right ventricular systolic function is normal. Left Atrium: Left atrial size was severely dilated. Right Atrium: Right atrial size was normal in size. Pericardium: There is no evidence of pericardial effusion. Mitral Valve: The mitral valve is normal in structure. Mild mitral valve regurgitation. No evidence of mitral valve stenosis. MV peak gradient, 6.1 mmHg. The mean mitral valve gradient is 2.0 mmHg. Tricuspid Valve: The  tricuspid valve is not well visualized. Tricuspid valve regurgitation is trivial. Aortic Valve: The aortic valve is calcified. Aortic valve regurgitation is not visualized. Aortic valve sclerosis/calcification is present, without any evidence of aortic stenosis. Aortic valve mean gradient measures 3.0 mmHg. Aortic valve peak gradient measures 5.3 mmHg. Aortic valve area, by VTI measures 1.69 cm. Pulmonic Valve: The pulmonic valve was not well visualized. Pulmonic valve regurgitation is mild. No evidence of pulmonic stenosis. Aorta: The aortic root and ascending aorta are structurally normal, with no evidence of dilitation. IAS/Shunts: The interatrial septum was not well visualized.  LEFT VENTRICLE PLAX 2D LVIDd:         5.00 cm      Diastology LVIDs:         4.10 cm      LV  e' medial:    4.03 cm/s LV PW:         1.30 cm      LV E/e' medial:  21.3 LV IVS:        1.50 cm      LV e' lateral:   8.59 cm/s LVOT diam:     2.00 cm      LV E/e' lateral: 10.0 LV SV:         36 LV SV Index:   17 LVOT Area:     3.14 cm  LV Volumes (MOD) LV vol d, MOD A2C: 178.0 ml LV vol d, MOD A4C: 207.0 ml LV vol s, MOD A2C: 165.0 ml LV vol s, MOD A4C: 96.2 ml LV SV MOD A2C:     13.0 ml LV SV MOD A4C:     207.0 ml LV SV MOD BP:      57.3 ml RIGHT VENTRICLE RV S prime:     18.00 cm/s TAPSE (M-mode): 4.8 cm LEFT ATRIUM              Index        RIGHT ATRIUM           Index LA diam:        5.30 cm  2.54 cm/m   RA Area:     17.30 cm LA Vol (A2C):   101.0 ml 48.46 ml/m  RA Volume:   48.50 ml  23.27 ml/m LA Vol (A4C):   127.0 ml 60.93 ml/m LA Biplane Vol: 118.0 ml 56.62 ml/m  AORTIC VALVE                    PULMONIC VALVE AV Area (Vmax):    1.30 cm     PV Vmax:        0.78 m/s AV Area (Vmean):   1.29 cm     PV Vmean:       53.600 cm/s AV Area (VTI):     1.69 cm     PV VTI:         0.152 m AV Vmax:           114.67 cm/s  PV Peak grad:   2.4 mmHg AV Vmean:          76.567 cm/s  PV Mean grad:   1.5 mmHg AV VTI:            0.214 m      RVOT  Peak grad: 4 mmHg AV Peak Grad:      5.3 mmHg AV Mean Grad:      3.0 mmHg LVOT Vmax:         47.60 cm/s LVOT Vmean:        31.500 cm/s LVOT VTI:          0.115 m LVOT/AV VTI ratio: 0.54  AORTA Ao Root diam: 2.93 cm MITRAL VALVE               TRICUSPID VALVE MV Area (PHT): 4.80 cm    TR Peak grad:   27.2 mmHg MV Area VTI:   1.02 cm    TR Vmax:        261.00 cm/s MV Peak grad:  6.1 mmHg MV Mean grad:  2.0 mmHg    SHUNTS MV Vmax:       1.23 m/s    Systemic VTI:  0.12 m MV Vmean:      61.9 cm/s   Systemic Diam: 2.00 cm MV  Decel Time: 158 msec    Pulmonic VTI:  0.217 m MV E velocity: 85.70 cm/s MV A velocity: 99.40 cm/s MV E/A ratio:  0.86 Donnelly Angelica Electronically signed by Donnelly Angelica Signature Date/Time: 09/30/2021/12:59:07 PM    Final        Assessment & Plan:   Problem List Items Addressed This Visit     Chronic systolic CHF (congestive heart failure), NYHA class 3 ([place])    ECHO 09/2021 - EF 30-35%.  Remains on lasix.  Breathing stable.        Hypertension, benign    On lasix.  Pressures as outlined. Follow pressures.  Follow metabolic panel.        Rectal bleeding - Primary    Bleeding as outlined.  No increased bleeding now.  Heme positive on exam.  Check cbc.  Previously saw surgery.  Felt to be c/w hemorrhoidal bleeding.  Previously treated with anusol HC suppositories with no problems until now.  Discussed further evaluation and referral back to confirm source.  He wants try the suppositories and see if any further problems.  Wants to hold on referral at this time.  Follow closely.  Call with update.       Relevant Orders   CBC with Differential/Platelet (Completed)   Venous insufficiency of both lower extremities    Being followed by lymphedema clinic.  Stable.         [name], MD

## 2021-11-28 ENCOUNTER — Encounter: Payer: Self-pay | Admitting: Internal Medicine

## 2021-11-28 NOTE — Assessment & Plan Note (Signed)
Bleeding as outlined.  No increased bleeding now.  Heme positive on exam.  Check cbc.  Previously saw surgery.  Felt to be c/w hemorrhoidal bleeding.  Previously treated with anusol HC suppositories with no problems until now.  Discussed further evaluation and referral back to confirm source.  He wants try the suppositories and see if any further problems.  Wants to hold on referral at this time.  Follow closely.  Call with update.  ?

## 2021-11-28 NOTE — Assessment & Plan Note (Signed)
Being followed by lymphedema clinic.  Stable.  ?

## 2021-11-28 NOTE — Assessment & Plan Note (Signed)
ECHO 09/2021 - EF 30-35%.  Remains on lasix.  Breathing stable.   ?

## 2021-11-28 NOTE — Assessment & Plan Note (Signed)
On lasix.  Pressures as outlined. Follow pressures.  Follow metabolic panel.   ?

## 2021-12-03 DIAGNOSIS — H18421 Band keratopathy, right eye: Secondary | ICD-10-CM | POA: Diagnosis not present

## 2021-12-13 ENCOUNTER — Ambulatory Visit (INDEPENDENT_AMBULATORY_CARE_PROVIDER_SITE_OTHER): Payer: Medicare Other | Admitting: Internal Medicine

## 2021-12-13 ENCOUNTER — Encounter: Payer: Self-pay | Admitting: Internal Medicine

## 2021-12-13 DIAGNOSIS — K625 Hemorrhage of anus and rectum: Secondary | ICD-10-CM

## 2021-12-13 DIAGNOSIS — I2510 Atherosclerotic heart disease of native coronary artery without angina pectoris: Secondary | ICD-10-CM

## 2021-12-13 DIAGNOSIS — I10 Essential (primary) hypertension: Secondary | ICD-10-CM | POA: Diagnosis not present

## 2021-12-13 DIAGNOSIS — R739 Hyperglycemia, unspecified: Secondary | ICD-10-CM

## 2021-12-13 DIAGNOSIS — Z8551 Personal history of malignant neoplasm of bladder: Secondary | ICD-10-CM

## 2021-12-13 DIAGNOSIS — I712 Thoracic aortic aneurysm, without rupture, unspecified: Secondary | ICD-10-CM

## 2021-12-13 DIAGNOSIS — I5022 Chronic systolic (congestive) heart failure: Secondary | ICD-10-CM | POA: Diagnosis not present

## 2021-12-13 DIAGNOSIS — I872 Venous insufficiency (chronic) (peripheral): Secondary | ICD-10-CM | POA: Diagnosis not present

## 2021-12-13 DIAGNOSIS — D649 Anemia, unspecified: Secondary | ICD-10-CM

## 2021-12-13 DIAGNOSIS — G7080 Lambert-Eaton syndrome, unspecified: Secondary | ICD-10-CM

## 2021-12-13 DIAGNOSIS — E7800 Pure hypercholesterolemia, unspecified: Secondary | ICD-10-CM

## 2021-12-13 NOTE — Progress Notes (Signed)
Patient ID: [name], male   DOB: [birth year], 88 y.o.   MRN: [id number] ? ? ?Virtual Visit via video Note ? ?This visit type was conducted due to national recommendations for restrictions regarding the COVID-19 pandemic (e.g. social distancing).  This format is felt to be most appropriate for this patient at this time.  All issues noted in this document were discussed and addressed.  No physical exam was performed (except for noted visual exam findings with Video Visits).  ? ?I connected with Debera Lat by telephone and verified that I am speaking with the correct person using two identifiers. ?Location patient: home ?Location provider: work  ?Persons participating in the telephone visit: patient, provider ? ?The limitations, risks, security and privacy concerns of performing an evaluation and management service by telephone and the availability of in person appointments have been discussed. It has also been discussed with the patient that there may be a patient responsible charge related to this service. The patient expressed understanding and agreed to proceed. ? ? ?Reason for visit: follow up appt ? ?HPI: ?Follow up regarding his blood pressure, breathing, lower extremity swelling and hypercholesterolemia.  He reports he is doing relatively well.  No chest pain.  Breathing stable.  Monitors his weight daily.  Today's weight 192.6 pounds.  States average at home typically 196 pounds.  No cough or congestion.  No abdominal pain.  Bowels moving.  Swelling stable.  Due to follow up with urology next week.   ? ? ?ROS: See pertinent positives and negatives per HPI. ? ?Past Medical History:  ?Diagnosis Date  ? Abnormal chest CT 03/28/2017  ? Allergy   ? Anemia   ? Angina pectoris ([place]) 04/02/2011  ? Aortic aneurysm ([place]) 03/28/2017  ? Saw Dr Genevive Bi 04/16/17 - recommended f/u CT in 3 months.    ? Atherosclerosis of both carotid arteries 07/17/2014  ? Benign lipomatous neoplasm of skin and subcutaneous tissue of left arm  10/04/2013  ? Bilateral carotid artery stenosis 08/30/2014  ? Overview:  Less than 50% 2015  ? Bladder cancer ([place]) 03/05/2013  ? CAD (coronary artery disease) 06/18/2014  ? Cancer ([place]) 11-24-12  ? bladder. BCG treatments by Dr Jacqlyn Larsen.  ? Chicken pox   ? Chronic prostatitis 09/30/2012  ? Colon polyp   ? Congestive heart failure ([place]) 04/20/2014  ? Overview:  Overview:  With anterior mi and moderate lv dyfunction ef 35%  ? Dizziness 12/22/2016  ? 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 Eaton-Lambert syndrome ([place])   ? Elevated prostate specific antigen (PSA) 09/30/2012  ? Essential hypertension, benign   ? Gross hematuria 09/30/2012  ? Herpes zoster 11/28/2011  ? Overview:  with ocular involvement OD  ? Hypercholesterolemia   ? Hypertension, benign 04/02/2011  ? Hypotension   ? Moderate mitral insufficiency 05/18/2015  ? Moderate tricuspid insufficiency 05/18/2015  ? Myasthenia gravis ([place])   ? Myasthenia gravis without exacerbation ([place]) 03/31/2011  ? Shingles 2013  ? Squamous cell carcinoma of skin 11/22/2015  ? Left cheek. WD SCC. Re-shaved 01/14/2016. Excised 02/05/2016, margins free.  ? Squamous cell carcinoma of skin 11/03/2018  ? Right cheek ant. to sideburn. Poorly differentiated.  ? Squamous cell carcinoma of skin 02/02/2019  ? Right mid dorsum forearm. WD SCC. EDC.  ? ? ?Past Surgical History:  ?Procedure Laterality Date  ? APPENDECTOMY  1958  ? CHOLECYSTECTOMY  1979  ? HERNIA REPAIR  1970  ? TONSILLECTOMY  1958  ? ? ?Family History  ?Problem Relation  Age of Onset  ? Lung cancer Sister   ? Prostate cancer Brother   ? Lung cancer Brother   ? Arthritis Other   ?     parent  ? Colon cancer Neg Hx   ? ? ?SOCIAL HX: reviewed.  ? ? ?Current Outpatient Medications:  ?  Acetaminophen (ARTHRITIS PAIN PO), Take 650 mg by mouth., Disp: , Rfl:  ?  aspirin 81 MG tablet, Take 81 mg by mouth daily., Disp: , Rfl:  ?  finasteride (PROSCAR) 5 MG tablet, Take 5 mg by mouth daily., Disp: , Rfl:  ?  furosemide (LASIX) 40 MG tablet, Take 1  tablet (40 mg total) by mouth daily., Disp: 90 tablet, Rfl: 3 ?  hydroxypropyl methylcellulose / hypromellose (ISOPTO TEARS / GONIOVISC) 2.5 % ophthalmic solution, Place 1 drop into both eyes daily., Disp: , Rfl:  ?  mycophenolate (CELLCEPT) 250 MG capsule, Take 1,000 mg by mouth 2 (two) times daily. , Disp: , Rfl:  ?  pravastatin (PRAVACHOL) 20 MG tablet, Take 1 tablet (20 mg total) by mouth daily., Disp: 90 tablet, Rfl: 1 ?  prednisoLONE acetate (PRED FORTE) 1 % ophthalmic suspension, Place 1 drop into the right eye daily. Per patient., Disp: 5 mL, Rfl: 0 ?  pyridostigmine (MESTINON) 60 MG tablet, Take 30 mg by mouth 6 (six) times daily. , Disp: , Rfl:  ?  tacrolimus (PROTOPIC) 0.1 % ointment, Apply topically 2 (two) times daily., Disp: 100 g, Rfl: 2 ?  tamsulosin (FLOMAX) 0.4 MG CAPS, Take 0.4 mg by mouth daily., Disp: , Rfl:  ? ?EXAM: ? ?GENERAL: alert, oriented, appears well and in no acute distress ? ?HEENT: atraumatic, conjunttiva clear, no obvious abnormalities on inspection of external nose and ears ? ?NECK: normal movements of the head and neck ? ?LUNGS: on inspection no signs of respiratory distress, breathing rate appears normal, no obvious gross SOB, gasping or wheezing ? ?CV: no obvious cyanosis ? ?PSYCH/NEURO: pleasant and cooperative, no obvious depression or anxiety, speech and thought processing grossly intact ? ?ASSESSMENT AND PLAN: ? ?Discussed the following assessment and plan: ? ?Problem List Items Addressed This Visit   ? ? Anemia  ?  Follow cbc.  ?  ?  ? Aortic aneurysm ([place])  ?  Previously evaluated by Dr Genevive Bi.  Stable.  Recommended f/u prn.  ?  ?  ? CHF (congestive heart failure) ([place])  ?  On lasix and lisinopril.  Weight has been stable.  Follow.  ?  ?  ? Chronic systolic CHF (congestive heart failure), NYHA class 3 ([place])  ?  ECHO 09/2021 - EF 30-35%.  Remains on lasix.  Breathing stable.  Following his weight.  ?  ?  ? Coronary artery disease involving native coronary artery of native  heart without angina pectoris  ?  Continue pravastatin and aspirin.  No chest pain.   ?  ?  ? History of bladder cancer  ?  Due to f/u with urology next week. No hematuria.  ?  ?  ? Hypercholesterolemia  ?  Continue pravastatin.  Low cholesterol diet and exercise.  Follow lipid panel and liver function tests.   ?  ?  ? Relevant Orders  ? Lipid panel  ? Hepatic function panel  ? Hyperglycemia  ?  Low carb diet and exercise.  Follow met b and a1c.  ?  ?  ? Relevant Orders  ? Hemoglobin A1c  ? Hypertension, benign  ?  On lasix.  Pressures have been  under control.  Follow pressures.  Follow metabolic panel.   ?  ?  ? Relevant Orders  ? Basic metabolic panel  ? Lambert-Eaton myasthenic syndrome ([place])  ?  On cellcept.  Treated with IVIG in hospital.  Declined prednisone.   ?  ?  ? Rectal bleeding  ?  Used suppositories.  No further bleeding.  Follow.  ?  ?  ? Venous insufficiency of both lower extremities  ?  Has been followed by lymphedema clinic.  Stable.  ?  ?  ? ? ?Return in about 3 months (around 03/15/2022) for follow up appt (80mn). ?  ?I discussed the assessment and treatment plan with the patient. The patient was provided an opportunity to ask questions and all were answered. The patient agreed with the plan and demonstrated an understanding of the instructions. ?  ?The patient was advised to call back or seek an in-person evaluation if the symptoms worsen or if the condition fails to improve as anticipated. ? ?I provided 15 minutes of non-face-to-face time during this encounter. ? ? ?CEinar Pheasant MD   ?

## 2021-12-14 ENCOUNTER — Encounter: Payer: Self-pay | Admitting: Internal Medicine

## 2021-12-14 NOTE — Assessment & Plan Note (Signed)
Follow cbc.  

## 2021-12-14 NOTE — Assessment & Plan Note (Signed)
On lasix.  Pressures have been under control.  Follow pressures.  Follow metabolic panel.   ?

## 2021-12-14 NOTE — Assessment & Plan Note (Signed)
Has been followed by lymphedema clinic.  Stable.  ?

## 2021-12-14 NOTE — Assessment & Plan Note (Signed)
On cellcept.  Treated with IVIG in hospital.  Declined prednisone.   ?

## 2021-12-14 NOTE — Assessment & Plan Note (Signed)
On lasix and lisinopril.  Weight has been stable.  Follow.  ?

## 2021-12-14 NOTE — Assessment & Plan Note (Signed)
Continue pravastatin and aspirin.  No chest pain.   ?

## 2021-12-14 NOTE — Assessment & Plan Note (Signed)
Previously evaluated by Dr Oaks.  Stable.  Recommended f/u prn.  

## 2021-12-14 NOTE — Assessment & Plan Note (Signed)
ECHO 09/2021 - EF 30-35%.  Remains on lasix.  Breathing stable.  Following his weight.  ?

## 2021-12-14 NOTE — Assessment & Plan Note (Signed)
Continue pravastatin.  Low cholesterol diet and exercise.  Follow lipid panel and liver function tests.   

## 2021-12-14 NOTE — Assessment & Plan Note (Signed)
Used suppositories.  No further bleeding.  Follow.  ?

## 2021-12-14 NOTE — Assessment & Plan Note (Signed)
Low carb diet and exercise.  Follow met b and a1c.  ?

## 2021-12-14 NOTE — Assessment & Plan Note (Signed)
Due to f/u with urology next week. No hematuria.  ?

## 2021-12-17 DIAGNOSIS — G7000 Myasthenia gravis without (acute) exacerbation: Secondary | ICD-10-CM | POA: Diagnosis not present

## 2021-12-17 DIAGNOSIS — G7080 Lambert-Eaton syndrome, unspecified: Secondary | ICD-10-CM | POA: Diagnosis not present

## 2021-12-18 ENCOUNTER — Encounter: Payer: Self-pay | Admitting: Dermatology

## 2021-12-27 ENCOUNTER — Telehealth: Payer: Self-pay | Admitting: Internal Medicine

## 2021-12-27 NOTE — Telephone Encounter (Signed)
Advised that patient will need to get shingrix at pharmacy or [facility] [place] due to insurance coverage. Daughter gave verbal understanding. ?

## 2021-12-27 NOTE — Telephone Encounter (Signed)
Pt daughter called in stating that pt neurologist is requesting pt to get the shingles vaccination... Pt daughter is inquiring more information about shingles vaccination  and would like to know the cost... Pt is requesting callback ?

## 2022-01-27 ENCOUNTER — Encounter: Payer: Self-pay | Admitting: Dermatology

## 2022-01-27 ENCOUNTER — Ambulatory Visit: Payer: Medicare Other | Admitting: Dermatology

## 2022-01-27 DIAGNOSIS — Z1283 Encounter for screening for malignant neoplasm of skin: Secondary | ICD-10-CM

## 2022-01-27 DIAGNOSIS — L578 Other skin changes due to chronic exposure to nonionizing radiation: Secondary | ICD-10-CM | POA: Diagnosis not present

## 2022-01-27 DIAGNOSIS — L814 Other melanin hyperpigmentation: Secondary | ICD-10-CM | POA: Diagnosis not present

## 2022-01-27 DIAGNOSIS — B078 Other viral warts: Secondary | ICD-10-CM

## 2022-01-27 DIAGNOSIS — Z85828 Personal history of other malignant neoplasm of skin: Secondary | ICD-10-CM

## 2022-01-27 DIAGNOSIS — L820 Inflamed seborrheic keratosis: Secondary | ICD-10-CM | POA: Diagnosis not present

## 2022-01-27 DIAGNOSIS — L821 Other seborrheic keratosis: Secondary | ICD-10-CM

## 2022-01-27 DIAGNOSIS — D1800 Hemangioma unspecified site: Secondary | ICD-10-CM

## 2022-01-27 DIAGNOSIS — L570 Actinic keratosis: Secondary | ICD-10-CM | POA: Diagnosis not present

## 2022-01-27 DIAGNOSIS — D229 Melanocytic nevi, unspecified: Secondary | ICD-10-CM

## 2022-01-27 NOTE — Patient Instructions (Addendum)
Recommend daily broad spectrum sunscreen SPF 30+ to sun-exposed areas, reapply every 2 hours as needed. Call for new or changing lesions.  ?Staying in the shade or wearing long sleeves, sun glasses (UVA+UVB protection) and wide brim hats (4-inch brim around the entire circumference of the hat) are also recommended for sun protection.  ? ?Cryotherapy Aftercare ? ?Wash gently with soap and water everyday.   ?Apply Vaseline and Band-Aid daily until healed.  ? ?Prior to procedure, discussed risks of blister formation, small wound, skin dyspigmentation, or rare scar following cryotherapy. Recommend Vaseline ointment to treated areas while healing.  ? ?Melanoma ABCDEs ? ?Melanoma is the most dangerous type of skin cancer, and is the leading cause of death from skin disease.  You are more likely to develop melanoma if you: ?Have light-colored skin, light-colored eyes, or red or blond hair ?Spend a lot of time in the sun ?Tan regularly, either outdoors or in a tanning bed ?Have had blistering sunburns, especially during childhood ?Have a close family member who has had a melanoma ?Have atypical moles or large birthmarks ? ?Early detection of melanoma is key since treatment is typically straightforward and cure rates are extremely high if we catch it early.  ? ?The first sign of melanoma is often a change in a mole or a new dark spot.  The ABCDE system is a way of remembering the signs of melanoma. ? ?A for asymmetry:  The two halves do not match. ?B for border:  The edges of the growth are irregular. ?C for color:  A mixture of colors are present instead of an even brown color. ?D for diameter:  Melanomas are usually (but not always) greater than 42m - the size of a pencil eraser. ?E for evolution:  The spot keeps changing in size, shape, and color. ? ?Please check your skin once per month between visits. You can use a small mirror in front and a large mirror behind you to keep an eye on the back side or your body.  ? ?If  you see any new or changing lesions before your next follow-up, please call to schedule a visit. ? ?Please continue daily skin protection including broad spectrum sunscreen SPF 30+ to sun-exposed areas, reapplying every 2 hours as needed when you're outdoors.  ? ?Staying in the shade or wearing long sleeves, sun glasses (UVA+UVB protection) and wide brim hats (4-inch brim around the entire circumference of the hat) are also recommended for sun protection.   ? ? ?If You Need Anything After Your Visit ? ?If you have any questions or concerns for your doctor, please call our main line at [phone] press option 4 to reach your doctor's medical assistant. If no one answers, please leave a voicemail as directed and we will return your call as soon as possible. Messages left after 4 pm will be answered the following business day.  ? ?You may also send [place] message via MyChart. We typically respond to MyChart messages within 1-2 business days. ? ?For prescription refills, please ask your pharmacy to contact our office. Our fax number is [phone] ? ?If you have an urgent issue when the clinic is closed that cannot wait until the next business day, you can page your doctor at the number below.   ? ?Please note that while we do our best to be available for urgent issues outside of office hours, we are not available 24/7.  ? ?If you have an urgent issue and are unable  to reach [place], you may choose to seek medical care at your doctor's office, retail clinic, urgent care center, or emergency room. ? ?If you have a medical emergency, please immediately call 911 or go to the emergency department. ? ?Pager Numbers ? ?- Dr. Nehemiah Massed: [phone] ? ?- Dr. Laurence Ferrari: [phone] ? ?- Dr. Nicole Kindred: [phone] ? ?In the event of inclement weather, please call our main line at [phone] for an update on the status of any delays or closures. ? ?Dermatology Medication Tips: ?Please keep the boxes that topical medications come in  in order to help keep track of the instructions about where and how to use these. Pharmacies typically print the medication instructions only on the boxes and not directly on the medication tubes.  ? ?If your medication is too expensive, please contact our office at [phone] option 4 or send [place] a message through [place].  ? ?We are unable to tell what your co-pay for medications will be in advance as this is different depending on your insurance coverage. However, we may be able to find a substitute medication at lower cost or fill out paperwork to get insurance to cover a needed medication.  ? ?If a prior authorization is required to get your medication covered by your insurance company, please allow [place] 1-2 business days to complete this process. ? ?Drug prices often vary depending on where the prescription is filled and some pharmacies may offer cheaper prices. ? ?The website www.goodrx.com contains coupons for medications through different pharmacies. The prices here do not account for what the cost may be with help from insurance (it may be cheaper with your insurance), but the website can give you the price if you did not use any insurance.  ?- You can print the associated coupon and take it with your prescription to the pharmacy.  ?- You may also stop by our office during regular business hours and pick up a GoodRx coupon card.  ?- If you need your prescription sent electronically to a different pharmacy, notify our office through [facility] or by phone at [phone] option 4. ? ? ? ? ?Si Usted Necesita Algo Despu?s de Su Visita ? ?Tambi?n puede enviarnos un mensaje a trav?s de MyChart. Por lo general respondemos a los mensajes de MyChart en el transcurso de 1 a 2 d?as h?biles. ? ?Para renovar recetas, por favor pida a su farmacia que se ponga en contacto con nuestra oficina. Nuestro n?mero de fax es el [phone]. ? ?Si tiene un asunto urgente cuando la cl?nica est? cerrada y que no puede  esperar hasta el siguiente d?a h?bil, puede llamar/localizar a su doctor(a) al n?mero que aparece a continuaci?n.  ? ?Por favor, tenga en cuenta que aunque hacemos todo lo posible para estar disponibles para asuntos urgentes fuera del horario de oficina, no estamos disponibles las 24 horas del d?a, los 7 d?as de la semana.  ? ?Si tiene un problema urgente y no puede comunicarse con nosotros, puede optar por buscar atenci?n m?dica  en el consultorio de su doctor(a), en una cl?nica privada, en un centro de atenci?n urgente o en una sala de emergencias. ? ?Si tiene Engineer, maintenance (IT) m?dica, por favor llame inmediatamente al 911 o vaya a la sala de emergencias. ? ?N?meros de b?per ? ?- Dr. Nehemiah Massed: [phone] ? ?- Dra. Moye: [phone] ? ?- Dra. Nicole Kindred: [phone] ? ?En caso de inclemencias del tiempo, por favor llame a nuestra l?nea principal al [phone] para una actualizaci?n sobre el [place] de  cualquier retraso o cierre. ? ?Consejos para la medicaci?n en dermatolog?a: ?Por favor, guarde las cajas en las que vienen los medicamentos de uso t?pico para ayudarle a seguir las instrucciones sobre d?nde y c?mo usarlos. Las farmacias generalmente imprimen las instrucciones del medicamento s?lo en las cajas y no directamente en los tubos del [place].  ? ?Si su medicamento es muy caro, por favor, p?ngase en contacto con Zigmund Daniel llamando al [phone] y presione la opci?n 4 o env?enos un mensaje a trav?s de MyChart.  ? ?No podemos decirle cu?l ser? su copago por los medicamentos por adelantado ya que esto es diferente dependiendo de la cobertura de su seguro. Sin embargo, es posible que podamos encontrar un medicamento sustituto a Electrical engineer un formulario para que el seguro cubra el medicamento que se considera necesario.  ? ?Si se requiere Ardelia Mems autorizaci?n previa para que su compa??a de seguros [place] su medicamento, por favor perm?tanos de 1 a 2 d?as h?biles para completar este proceso. ? ?Los  precios de los medicamentos var?an con frecuencia dependiendo del Environmental consultant de d?nde se surte la receta y alguna farmacias pueden ofrecer precios m?s baratos. ? ?El sitio web www.goodrx.com tiene cupones para

## 2022-01-27 NOTE — Progress Notes (Signed)
? ?Follow-Up Visit ?  ?Subjective  ?[name] is a 88 y.o. male who presents for the following: Annual Exam (Here for skin cancer screening. Upper body. Hx of SCC/BCC). ?The patient presents for Total-Body Skin Exam (TBSE) for skin cancer screening and mole check.  The patient has spots, moles and lesions to be evaluated, some may be new or changing and the patient has concerns that these could be cancer. ? ?The following portions of the chart were reviewed this encounter and updated as appropriate:  Tobacco  Allergies  Meds  Problems  Med Hx  Surg Hx  Fam Hx   ?  ?Review of Systems: No other skin or systemic complaints except as noted in HPI or Assessment and Plan. ? ?Objective  ?Well appearing patient in no apparent distress; mood and affect are within normal limits. ? ?All skin waist up examined. ? ?scalp, face, ears x17 (17) ?Erythematous thin papules/macules with gritty scale.  ? ?face and neck x5 (5) ?Erythematous keratotic or waxy stuck-on papule or plaque. ? ?Left Dorsal Mid 4th Finger x1 ?Verrucous papules -- Discussed viral etiology and contagion.  ? ? ?Assessment & Plan  ? ?History of Basal Cell Carcinoma of the Skin. Left ant neck. ?- No evidence of recurrence today ?- Recommend regular full body skin exams ?- Recommend daily broad spectrum sunscreen SPF 30+ to sun-exposed areas, reapply every 2 hours as needed.  ?- Call if any new or changing lesions are noted between office visits ? ?History of Squamous Cell Carcinoma of the Skin. Right cheek, right mid dorsum forearm. ?- No evidence of recurrence today ?- No lymphadenopathy ?- Recommend regular full body skin exams ?- Recommend daily broad spectrum sunscreen SPF 30+ to sun-exposed areas, reapply every 2 hours as needed.  ?- Call if any new or changing lesions are noted between office visits ? ?Lentigines ?- Scattered tan macules ?- Due to sun exposure ?- Benign-appearing, observe ?- Recommend daily broad spectrum sunscreen SPF 30+ to  sun-exposed areas, reapply every 2 hours as needed. ?- Call for any changes ? ?Seborrheic Keratoses ?- Stuck-on, waxy, tan-brown papules and/or plaques  ?- Benign-appearing ?- Discussed benign etiology and prognosis. ?- Observe ?- Call for any changes ? ?Melanocytic Nevi ?- Tan-brown and/or pink-flesh-colored symmetric macules and papules ?- Benign appearing on exam today ?- Observation ?- Call clinic for new or changing moles ?- Recommend daily use of broad spectrum spf 30+ sunscreen to sun-exposed areas.  ? ?Hemangiomas ?- Red papules ?- Discussed benign nature ?- Observe ?- Call for any changes ? ?Actinic Damage ?- Chronic condition, secondary to cumulative UV/sun exposure ?- diffuse scaly erythematous macules with underlying dyspigmentation ?- Recommend daily broad spectrum sunscreen SPF 30+ to sun-exposed areas, reapply every 2 hours as needed.  ?- Staying in the shade or wearing long sleeves, sun glasses (UVA+UVB protection) and wide brim hats (4-inch brim around the entire circumference of the hat) are also recommended for sun protection.  ?- Call for new or changing lesions. ? ?Skin cancer screening performed today. ? ?AK (actinic keratosis) (17) ?scalp, face, ears x17 ?Actinic keratoses are precancerous spots that appear secondary to cumulative UV radiation exposure/sun exposure over time. They are chronic with expected duration over 1 year. A portion of actinic keratoses will progress to squamous cell carcinoma of the skin. It is not possible to reliably predict which spots will progress to skin cancer and so treatment is recommended to prevent development of skin cancer. ? ?Recommend daily broad spectrum sunscreen  SPF 30+ to sun-exposed areas, reapply every 2 hours as needed.  ?Recommend staying in the shade or wearing long sleeves, sun glasses (UVA+UVB protection) and wide brim hats (4-inch brim around the entire circumference of the hat). ?Call for new or changing lesions. ? ?Destruction of lesion -  scalp, face, ears x17 ?Complexity: simple   ?Destruction method: cryotherapy   ?Informed consent: discussed and consent obtained   ?Timeout:  patient name, date of birth, surgical site, and procedure verified ?Lesion destroyed using liquid nitrogen: Yes   ?Region frozen until ice ball extended beyond lesion: Yes   ?Outcome: patient tolerated procedure well with no complications   ?Post-procedure details: wound care instructions given   ? ?Inflamed seborrheic keratosis (5) ?face and neck x5 ?Destruction of lesion - face and neck x5 ?Complexity: simple   ?Destruction method: cryotherapy   ?Informed consent: discussed and consent obtained   ?Timeout:  patient name, date of birth, surgical site, and procedure verified ?Lesion destroyed using liquid nitrogen: Yes   ?Region frozen until ice ball extended beyond lesion: Yes   ?Outcome: patient tolerated procedure well with no complications   ?Post-procedure details: wound care instructions given   ? ?Other viral warts ?Left Dorsal Mid 4th Finger x1 ?Discussed viral etiology and risk of spread.  Discussed multiple treatments may be required to clear warts.  Discussed possible post-treatment dyspigmentation and risk of recurrence. ?Recheck at next visit. ? ?Destruction of lesion - Left Dorsal Mid 4th Finger x1 ?Complexity: simple   ?Destruction method: cryotherapy   ?Informed consent: discussed and consent obtained   ?Timeout:  patient name, date of birth, surgical site, and procedure verified ?Lesion destroyed using liquid nitrogen: Yes   ?Region frozen until ice ball extended beyond lesion: Yes   ?Outcome: patient tolerated procedure well with no complications   ?Post-procedure details: wound care instructions given   ? ?Return for AK Follow Up, ISK Follow Up, Wart Follow UP 5-6 months . ? ?I, [name], CMA, am acting as scribe for [name], MD. ?Documentation: I have reviewed the above documentation for accuracy and completeness, and I agree with the  above. ? ?[name], MD ? ? ?

## 2022-02-04 ENCOUNTER — Encounter: Payer: Self-pay | Admitting: Dermatology

## 2022-03-13 DIAGNOSIS — G7000 Myasthenia gravis without (acute) exacerbation: Secondary | ICD-10-CM | POA: Diagnosis not present

## 2022-03-21 DIAGNOSIS — G7000 Myasthenia gravis without (acute) exacerbation: Secondary | ICD-10-CM | POA: Diagnosis not present

## 2022-03-27 DIAGNOSIS — C672 Malignant neoplasm of lateral wall of bladder: Secondary | ICD-10-CM | POA: Diagnosis not present

## 2022-03-27 DIAGNOSIS — G7000 Myasthenia gravis without (acute) exacerbation: Secondary | ICD-10-CM | POA: Diagnosis not present

## 2022-04-04 DIAGNOSIS — G7000 Myasthenia gravis without (acute) exacerbation: Secondary | ICD-10-CM | POA: Diagnosis not present

## 2022-04-17 DIAGNOSIS — I071 Rheumatic tricuspid insufficiency: Secondary | ICD-10-CM | POA: Diagnosis not present

## 2022-04-17 DIAGNOSIS — I350 Nonrheumatic aortic (valve) stenosis: Secondary | ICD-10-CM | POA: Diagnosis not present

## 2022-04-17 DIAGNOSIS — I340 Nonrheumatic mitral (valve) insufficiency: Secondary | ICD-10-CM | POA: Diagnosis not present

## 2022-04-17 DIAGNOSIS — I5022 Chronic systolic (congestive) heart failure: Secondary | ICD-10-CM | POA: Diagnosis not present

## 2022-04-21 ENCOUNTER — Telehealth: Payer: Self-pay | Admitting: Internal Medicine

## 2022-04-21 NOTE — Telephone Encounter (Signed)
Pt prev seen Dr Bary Castilla - pt daughter advised and given # Pt daughter advised if cannot see Dr Bary Castilla soon, please call our office to sched eval

## 2022-04-21 NOTE — Telephone Encounter (Signed)
Pt daughter called stating the pt wants to know what the name of the doctor he saw for his hemorrhoid at [facility] clinic because he is still bleeding Daughter work number [phone]

## 2022-04-22 DIAGNOSIS — K625 Hemorrhage of anus and rectum: Secondary | ICD-10-CM | POA: Diagnosis not present

## 2022-04-22 DIAGNOSIS — K648 Other hemorrhoids: Secondary | ICD-10-CM | POA: Diagnosis not present

## 2022-04-25 DIAGNOSIS — G7000 Myasthenia gravis without (acute) exacerbation: Secondary | ICD-10-CM | POA: Diagnosis not present

## 2022-05-01 DIAGNOSIS — G7000 Myasthenia gravis without (acute) exacerbation: Secondary | ICD-10-CM | POA: Diagnosis not present

## 2022-05-09 DIAGNOSIS — G7000 Myasthenia gravis without (acute) exacerbation: Secondary | ICD-10-CM | POA: Diagnosis not present

## 2022-05-13 ENCOUNTER — Ambulatory Visit: Payer: Medicare Other | Admitting: Dermatology

## 2022-05-13 DIAGNOSIS — L03115 Cellulitis of right lower limb: Secondary | ICD-10-CM

## 2022-05-13 DIAGNOSIS — L03116 Cellulitis of left lower limb: Secondary | ICD-10-CM

## 2022-05-13 DIAGNOSIS — L03119 Cellulitis of unspecified part of limb: Secondary | ICD-10-CM

## 2022-05-13 DIAGNOSIS — I890 Lymphedema, not elsewhere classified: Secondary | ICD-10-CM | POA: Diagnosis not present

## 2022-05-13 DIAGNOSIS — I872 Venous insufficiency (chronic) (peripheral): Secondary | ICD-10-CM | POA: Diagnosis not present

## 2022-05-13 MED ORDER — DOXYCYCLINE MONOHYDRATE 100 MG PO CAPS: 100.0000 mg | ORAL_CAPSULE | Freq: Two times a day (BID) | ORAL | 0 refills | Status: AC

## 2022-05-13 NOTE — Progress Notes (Signed)
   Follow-Up Visit   Subjective  [name] is a 88 y.o. male who presents for the following: hx of Stasis dermatitis with Lymphedema (R lower leg with drainage, 2 wks, pt wears compression socks qd).  The following portions of the chart were reviewed this encounter and updated as appropriate:   Tobacco  Allergies  Meds  Problems  Med Hx  Surg Hx  Fam Hx     Review of Systems:  No other skin or systemic complaints except as noted in HPI or Assessment and Plan.  Objective  Well appearing patient in no apparent distress; mood and affect are within normal limits.  A focused examination was performed including bilateral lower legs. Relevant physical exam findings are noted in the Assessment and Plan.  bil lower legs Crusting, peeling, erosions and large thick flakes of skin, significant edema and erythema bil lower legs                Assessment & Plan  Stasis dermatitis of both legs bil lower legs With Lymphedema and cellulitis Stasis in the legs causes chronic leg swelling, which may result in itchy or painful rashes, skin discoloration, skin texture changes, and sometimes ulceration.  Recommend daily graduated compression hose/stockings- easiest to put on first thing in morning, remove at bedtime.  Elevate legs as much as possible. Avoid salt/sodium rich foods. Chronic and persistent condition with duration or expected duration over one year. Condition is bothersome/symptomatic for patient. Currently flared.  Start Doxycycline '100mg'$  1 po bid with food and drink for 10 days Unna Boots applied to L and R lower legs, legs were cleaned with puracyn then boot applied, followed by coban wrap  Referral to Lymphedema clinic placed today  Doxycycline should be taken with food to prevent nausea. Do not lay down for 30 minutes after taking. Be cautious with sun exposure and use good sun protection while on this medication. Pregnant women should not take this medication.     doxycycline (MONODOX) 100 MG capsule - bil lower legs Take 1 capsule (100 mg total) by mouth 2 (two) times daily for 10 days. Take with food and drink  Remv/revisn boot/body cast - bil lower legs  Remv/revisn boot/body cast - bil lower legs  Related Procedures Ambulatory referral to Occupational Therapy  Return in about 2 days (around 05/15/2022) for stasis derm f/u.  I, Othelia Pulling, RMA, am acting as scribe for [name], MD . Documentation: I have reviewed the above documentation for accuracy and completeness, and I agree with the above.  [name], MD

## 2022-05-13 NOTE — Patient Instructions (Signed)
Doxycycline should be taken with food to prevent nausea. Do not lay down for 30 minutes after taking. Be cautious with sun exposure and use good sun protection while on this medication. Pregnant women should not take this medication.   Due to recent changes in healthcare laws, you may see results of your pathology and/or laboratory studies on MyChart before the doctors have had a chance to review them. We understand that in some cases there may be results that are confusing or concerning to you. Please understand that not all results are received at the same time and often the doctors may need to interpret multiple results in order to provide you with the best plan of care or course of treatment. Therefore, we ask that you please give us 2 business days to thoroughly review all your results before contacting the office for clarification. Should we see a critical lab result, you will be contacted sooner.   If You Need Anything After Your Visit  If you have any questions or concerns for your doctor, please call our main line at [phone] and press option 4 to reach your doctor's medical assistant. If no one answers, please leave a voicemail as directed and we will return your call as soon as possible. Messages left after 4 pm will be answered the following business day.   You may also send us a message via MyChart. We typically respond to MyChart messages within 1-2 business days.  For prescription refills, please ask your pharmacy to contact our office. Our fax number is [phone].  If you have an urgent issue when the clinic is closed that cannot wait until the next business day, you can page your doctor at the number below.    Please note that while we do our best to be available for urgent issues outside of office hours, we are not available 24/7.   If you have an urgent issue and are unable to reach us, you may choose to seek medical care at your doctor's office, retail clinic, urgent care  center, or emergency room.  If you have a medical emergency, please immediately call 911 or go to the emergency department.  Pager Numbers  - Dr. Kowalski: [phone]  - Dr. Moye: [phone]  - Dr. Stewart: [phone]  In the event of inclement weather, please call our main line at [phone] for an update on the status of any delays or closures.  Dermatology Medication Tips: Please keep the boxes that topical medications come in in order to help keep track of the instructions about where and how to use these. Pharmacies typically print the medication instructions only on the boxes and not directly on the medication tubes.   If your medication is too expensive, please contact our office at [phone] option 4 or send us a message through MyChart.   We are unable to tell what your co-pay for medications will be in advance as this is different depending on your insurance coverage. However, we may be able to find a substitute medication at lower cost or fill out paperwork to get insurance to cover a needed medication.   If a prior authorization is required to get your medication covered by your insurance company, please allow us 1-2 business days to complete this process.  Drug prices often vary depending on where the prescription is filled and some pharmacies may offer cheaper prices.  The website www.goodrx.com contains coupons for medications through different pharmacies. The prices here do not account for what the   cost may be with help from insurance (it may be cheaper with your insurance), but the website can give you the price if you did not use any insurance.  - You can print the associated coupon and take it with your prescription to the pharmacy.  - You may also stop by our office during regular business hours and pick up a GoodRx coupon card.  - If you need your prescription sent electronically to a different pharmacy, notify our office through [facility] MyChart or by  phone at [phone] option 4.     Si Usted Necesita Algo Despus de Su Visita  Tambin puede enviarnos un mensaje a travs de MyChart. Por lo general respondemos a los mensajes de MyChart en el transcurso de 1 a 2 das hbiles.  Para renovar recetas, por favor pida a su farmacia que se ponga en contacto con nuestra oficina. Nuestro nmero de fax es el [phone].  Si tiene un asunto urgente cuando la clnica est cerrada y que no puede esperar hasta el siguiente da hbil, puede llamar/localizar a su doctor(a) al nmero que aparece a continuacin.   Por favor, tenga en cuenta que aunque hacemos todo lo posible para estar disponibles para asuntos urgentes fuera del horario de oficina, no estamos disponibles las 24 horas del da, los 7 das de la semana.   Si tiene un problema urgente y no puede comunicarse con nosotros, puede optar por buscar atencin mdica  en el consultorio de su doctor(a), en una clnica privada, en un centro de atencin urgente o en una sala de emergencias.  Si tiene una emergencia mdica, por favor llame inmediatamente al 911 o vaya a la sala de emergencias.  Nmeros de bper  - Dr. Kowalski: [phone]  - Dra. Moye: [phone]  - Dra. Stewart: [phone]  En caso de inclemencias del tiempo, por favor llame a nuestra lnea principal al [phone] para una actualizacin sobre el estado de cualquier retraso o cierre.  Consejos para la medicacin en dermatologa: Por favor, guarde las cajas en las que vienen los medicamentos de uso tpico para ayudarle a seguir las instrucciones sobre dnde y cmo usarlos. Las farmacias generalmente imprimen las instrucciones del medicamento slo en las cajas y no directamente en los tubos del medicamento.   Si su medicamento es muy caro, por favor, pngase en contacto con nuestra oficina llamando al [phone] y presione la opcin 4 o envenos un mensaje a travs de MyChart.   No podemos decirle cul ser su copago  por los medicamentos por adelantado ya que esto es diferente dependiendo de la cobertura de su seguro. Sin embargo, es posible que podamos encontrar un medicamento sustituto a menor costo o llenar un formulario para que el seguro cubra el medicamento que se considera necesario.   Si se requiere una autorizacin previa para que su compaa de seguros cubra su medicamento, por favor permtanos de 1 a 2 das hbiles para completar este proceso.  Los precios de los medicamentos varan con frecuencia dependiendo del lugar de dnde se surte la receta y alguna farmacias pueden ofrecer precios ms baratos.  El sitio web www.goodrx.com tiene cupones para medicamentos de diferentes farmacias. Los precios aqu no tienen en cuenta lo que podra costar con la ayuda del seguro (puede ser ms barato con su seguro), pero el sitio web puede darle el precio si no utiliz ningn seguro.  - Puede imprimir el cupn correspondiente y llevarlo con su receta a la farmacia.  - Tambin puede pasar por   puede pasar por nuestra oficina durante el horario de atencin regular y [employer] una tarjeta de cupones de GoodRx.  - Si necesita que su receta se enve electrnicamente a una farmacia diferente, informe a nuestra oficina a travs de MyChart de [place] o por telfono llamando al [phone] y presione la opcin 4.

## 2022-05-15 ENCOUNTER — Ambulatory Visit: Payer: Medicare Other | Admitting: Dermatology

## 2022-05-15 DIAGNOSIS — I872 Venous insufficiency (chronic) (peripheral): Secondary | ICD-10-CM | POA: Diagnosis not present

## 2022-05-19 ENCOUNTER — Telehealth: Payer: Self-pay

## 2022-05-19 ENCOUNTER — Ambulatory Visit: Payer: Medicare Other | Admitting: Dermatology

## 2022-05-19 DIAGNOSIS — I890 Lymphedema, not elsewhere classified: Secondary | ICD-10-CM

## 2022-05-19 DIAGNOSIS — I872 Venous insufficiency (chronic) (peripheral): Secondary | ICD-10-CM

## 2022-05-19 DIAGNOSIS — L97819 Non-pressure chronic ulcer of other part of right lower leg with unspecified severity: Secondary | ICD-10-CM

## 2022-05-19 NOTE — Telephone Encounter (Signed)
Called [facility] occupational therapy to follow up on patient's referral for lymphedema. Confirmed they have received referral but patient has not been scheduled yet - referral is in work queue.  Lurlean Horns., RMA

## 2022-05-19 NOTE — Patient Instructions (Signed)
Due to recent changes in healthcare laws, you may see results of your pathology and/or laboratory studies on MyChart before the doctors have had a chance to review them. We understand that in some cases there may be results that are confusing or concerning to you. Please understand that not all results are received at the same time and often the doctors may need to interpret multiple results in order to provide you with the best plan of care or course of treatment. Therefore, we ask that you please give us 2 business days to thoroughly review all your results before contacting the office for clarification. Should we see a critical lab result, you will be contacted sooner.   If You Need Anything After Your Visit  If you have any questions or concerns for your doctor, please call our main line at [phone] and press option 4 to reach your doctor's medical assistant. If no one answers, please leave a voicemail as directed and we will return your call as soon as possible. Messages left after 4 pm will be answered the following business day.   You may also send us a message via MyChart. We typically respond to MyChart messages within 1-2 business days.  For prescription refills, please ask your pharmacy to contact our office. Our fax number is [phone].  If you have an urgent issue when the clinic is closed that cannot wait until the next business day, you can page your doctor at the number below.    Please note that while we do our best to be available for urgent issues outside of office hours, we are not available 24/7.   If you have an urgent issue and are unable to reach us, you may choose to seek medical care at your doctor's office, retail clinic, urgent care center, or emergency room.  If you have a medical emergency, please immediately call 911 or go to the emergency department.  Pager Numbers  - Dr. Kowalski: [phone]  - Dr. Moye: [phone]  - Dr. Stewart:  [phone]  In the event of inclement weather, please call our main line at [phone] for an update on the status of any delays or closures.  Dermatology Medication Tips: Please keep the boxes that topical medications come in in order to help keep track of the instructions about where and how to use these. Pharmacies typically print the medication instructions only on the boxes and not directly on the medication tubes.   If your medication is too expensive, please contact our office at [phone] option 4 or send us a message through MyChart.   We are unable to tell what your co-pay for medications will be in advance as this is different depending on your insurance coverage. However, we may be able to find a substitute medication at lower cost or fill out paperwork to get insurance to cover a needed medication.   If a prior authorization is required to get your medication covered by your insurance company, please allow us 1-2 business days to complete this process.  Drug prices often vary depending on where the prescription is filled and some pharmacies may offer cheaper prices.  The website www.goodrx.com contains coupons for medications through different pharmacies. The prices here do not account for what the cost may be with help from insurance (it may be cheaper with your insurance), but the website can give you the price if you did not use any insurance.  - You can print the associated coupon and take it with   your prescription to the pharmacy.  - You may also stop by our office during regular business hours and pick up a GoodRx coupon card.  - If you need your prescription sent electronically to a different pharmacy, notify our office through [facility] MyChart or by phone at [phone] option 4.     Si Usted Necesita Algo Despus de Su Visita  Tambin puede enviarnos un mensaje a travs de MyChart. Por lo general respondemos a los mensajes de MyChart en el transcurso de 1 a 2  das hbiles.  Para renovar recetas, por favor pida a su farmacia que se ponga en contacto con nuestra oficina. Nuestro nmero de fax es el [phone].  Si tiene un asunto urgente cuando la clnica est cerrada y que no puede esperar hasta el siguiente da hbil, puede llamar/localizar a su doctor(a) al nmero que aparece a continuacin.   Por favor, tenga en cuenta que aunque hacemos todo lo posible para estar disponibles para asuntos urgentes fuera del horario de oficina, no estamos disponibles las 24 horas del da, los 7 das de la semana.   Si tiene un problema urgente y no puede comunicarse con nosotros, puede optar por buscar atencin mdica  en el consultorio de su doctor(a), en una clnica privada, en un centro de atencin urgente o en una sala de emergencias.  Si tiene una emergencia mdica, por favor llame inmediatamente al 911 o vaya a la sala de emergencias.  Nmeros de bper  - Dr. Kowalski: [phone]  - Dra. Moye: [phone]  - Dra. Stewart: [phone]  En caso de inclemencias del tiempo, por favor llame a nuestra lnea principal al [phone] para una actualizacin sobre el estado de cualquier retraso o cierre.  Consejos para la medicacin en dermatologa: Por favor, guarde las cajas en las que vienen los medicamentos de uso tpico para ayudarle a seguir las instrucciones sobre dnde y cmo usarlos. Las farmacias generalmente imprimen las instrucciones del medicamento slo en las cajas y no directamente en los tubos del medicamento.   Si su medicamento es muy caro, por favor, pngase en contacto con nuestra oficina llamando al [phone] y presione la opcin 4 o envenos un mensaje a travs de MyChart.   No podemos decirle cul ser su copago por los medicamentos por adelantado ya que esto es diferente dependiendo de la cobertura de su seguro. Sin embargo, es posible que podamos encontrar un medicamento sustituto a menor costo o llenar un formulario para que el  seguro cubra el medicamento que se considera necesario.   Si se requiere una autorizacin previa para que su compaa de seguros cubra su medicamento, por favor permtanos de 1 a 2 das hbiles para completar este proceso.  Los precios de los medicamentos varan con frecuencia dependiendo del lugar de dnde se surte la receta y alguna farmacias pueden ofrecer precios ms baratos.  El sitio web www.goodrx.com tiene cupones para medicamentos de diferentes farmacias. Los precios aqu no tienen en cuenta lo que podra costar con la ayuda del seguro (puede ser ms barato con su seguro), pero el sitio web puede darle el precio si no utiliz ningn seguro.  - Puede imprimir el cupn correspondiente y llevarlo con su receta a la farmacia.  - Tambin puede pasar por nuestra oficina durante el horario de atencin regular y recoger una tarjeta de cupones de GoodRx.  - Si necesita que su receta se enve electrnicamente a una farmacia diferente, informe a nuestra oficina a travs de MyChart de [facility]   o por telfono llamando al [phone] y presione la opcin 4.  

## 2022-05-19 NOTE — Progress Notes (Signed)
   Follow-Up Visit   Subjective  [name] is a 88 y.o. male who presents for the following: Follow-up (Patient here today for 4 day stasis follow up and unna boots. ).  Patient still taking doxycycline 100 mg twice daily.   The following portions of the chart were reviewed this encounter and updated as appropriate:       Review of Systems:  No other skin or systemic complaints except as noted in HPI or Assessment and Plan.  Objective  Well appearing patient in no apparent distress; mood and affect are within normal limits.  A focused examination was performed including lower legs. Relevant physical exam findings are noted in the Assessment and Plan.  bilateral lower legs Violaceous discoloration with woody edema, 2 + pitting edema L > R Cribriform erosion with ooze at right lateral lower leg after duoderm removed.     Assessment & Plan  Stasis dermatitis of both legs bilateral lower legs  With chronic erosion/superficial ulceration R lower leg and lymphedema  Unna boots removed, legs cleaned with Puracyn. Mupirocin/Telfa applied to eroded area at right lower leg, followed by Vaseline to entire lower legs and feet.  Unna Boots applied to L and R lower legs, followed by coban wrap. Referral pending to lymphedema clinic   Stasis in the legs causes chronic leg swelling, which may result in itchy or painful rashes, skin discoloration, skin texture changes, and sometimes ulceration.  Recommend daily graduated compression hose/stockings- easiest to put on first thing in morning, remove at bedtime.  Elevate legs as much as possible. Avoid salt/sodium rich foods.  Remv/revisn boot/body cast - bilateral lower legs  Remv/revisn boot/body cast - bilateral lower legs  Related Medications doxycycline (MONODOX) 100 MG capsule Take 1 capsule (100 mg total) by mouth 2 (two) times daily for 10 days. Take with food and drink   Return in about 3 days (around 05/22/2022).  [name], RMA, am acting as scribe for [name], MD .  Documentation: I have reviewed the above documentation for accuracy and completeness, and I agree with the above.  [name] MD

## 2022-05-19 NOTE — Patient Instructions (Signed)
Due to recent changes in healthcare laws, you may see results of your pathology and/or laboratory studies on MyChart before the doctors have had a chance to review them. We understand that in some cases there may be results that are confusing or concerning to you. Please understand that not all results are received at the same time and often the doctors may need to interpret multiple results in order to provide you with the best plan of care or course of treatment. Therefore, we ask that you please give us 2 business days to thoroughly review all your results before contacting the office for clarification. Should we see a critical lab result, you will be contacted sooner.   If You Need Anything After Your Visit  If you have any questions or concerns for your doctor, please call our main line at [phone] and press option 4 to reach your doctor's medical assistant. If no one answers, please leave a voicemail as directed and we will return your call as soon as possible. Messages left after 4 pm will be answered the following business day.   You may also send us a message via MyChart. We typically respond to MyChart messages within 1-2 business days.  For prescription refills, please ask your pharmacy to contact our office. Our fax number is [phone].  If you have an urgent issue when the clinic is closed that cannot wait until the next business day, you can page your doctor at the number below.    Please note that while we do our best to be available for urgent issues outside of office hours, we are not available 24/7.   If you have an urgent issue and are unable to reach us, you may choose to seek medical care at your doctor's office, retail clinic, urgent care center, or emergency room.  If you have a medical emergency, please immediately call 911 or go to the emergency department.  Pager Numbers  - Dr. Kowalski: [phone]  - Dr. Moye: [phone]  - Dr. Stewart:  [phone]  In the event of inclement weather, please call our main line at [phone] for an update on the status of any delays or closures.  Dermatology Medication Tips: Please keep the boxes that topical medications come in in order to help keep track of the instructions about where and how to use these. Pharmacies typically print the medication instructions only on the boxes and not directly on the medication tubes.   If your medication is too expensive, please contact our office at [phone] option 4 or send us a message through MyChart.   We are unable to tell what your co-pay for medications will be in advance as this is different depending on your insurance coverage. However, we may be able to find a substitute medication at lower cost or fill out paperwork to get insurance to cover a needed medication.   If a prior authorization is required to get your medication covered by your insurance company, please allow us 1-2 business days to complete this process.  Drug prices often vary depending on where the prescription is filled and some pharmacies may offer cheaper prices.  The website www.goodrx.com contains coupons for medications through different pharmacies. The prices here do not account for what the cost may be with help from insurance (it may be cheaper with your insurance), but the website can give you the price if you did not use any insurance.  - You can print the associated coupon and take it with   your prescription to the pharmacy.  - You may also stop by our office during regular business hours and pick up a GoodRx coupon card.  - If you need your prescription sent electronically to a different pharmacy, notify our office through [facility] MyChart or by phone at [phone] option 4.     Si Usted Necesita Algo Despus de Su Visita  Tambin puede enviarnos un mensaje a travs de MyChart. Por lo general respondemos a los mensajes de MyChart en el transcurso de 1 a 2  das hbiles.  Para renovar recetas, por favor pida a su farmacia que se ponga en contacto con nuestra oficina. Nuestro nmero de fax es el [phone].  Si tiene un asunto urgente cuando la clnica est cerrada y que no puede esperar hasta el siguiente da hbil, puede llamar/localizar a su doctor(a) al nmero que aparece a continuacin.   Por favor, tenga en cuenta que aunque hacemos todo lo posible para estar disponibles para asuntos urgentes fuera del horario de oficina, no estamos disponibles las 24 horas del da, los 7 das de la semana.   Si tiene un problema urgente y no puede comunicarse con nosotros, puede optar por buscar atencin mdica  en el consultorio de su doctor(a), en una clnica privada, en un centro de atencin urgente o en una sala de emergencias.  Si tiene una emergencia mdica, por favor llame inmediatamente al 911 o vaya a la sala de emergencias.  Nmeros de bper  - Dr. Kowalski: [phone]  - Dra. Moye: [phone]  - Dra. Stewart: [phone]  En caso de inclemencias del tiempo, por favor llame a nuestra lnea principal al [phone] para una actualizacin sobre el estado de cualquier retraso o cierre.  Consejos para la medicacin en dermatologa: Por favor, guarde las cajas en las que vienen los medicamentos de uso tpico para ayudarle a seguir las instrucciones sobre dnde y cmo usarlos. Las farmacias generalmente imprimen las instrucciones del medicamento slo en las cajas y no directamente en los tubos del medicamento.   Si su medicamento es muy caro, por favor, pngase en contacto con nuestra oficina llamando al [phone] y presione la opcin 4 o envenos un mensaje a travs de MyChart.   No podemos decirle cul ser su copago por los medicamentos por adelantado ya que esto es diferente dependiendo de la cobertura de su seguro. Sin embargo, es posible que podamos encontrar un medicamento sustituto a menor costo o llenar un formulario para que el  seguro cubra el medicamento que se considera necesario.   Si se requiere una autorizacin previa para que su compaa de seguros cubra su medicamento, por favor permtanos de 1 a 2 das hbiles para completar este proceso.  Los precios de los medicamentos varan con frecuencia dependiendo del lugar de dnde se surte la receta y alguna farmacias pueden ofrecer precios ms baratos.  El sitio web www.goodrx.com tiene cupones para medicamentos de diferentes farmacias. Los precios aqu no tienen en cuenta lo que podra costar con la ayuda del seguro (puede ser ms barato con su seguro), pero el sitio web puede darle el precio si no utiliz ningn seguro.  - Puede imprimir el cupn correspondiente y llevarlo con su receta a la farmacia.  - Tambin puede pasar por nuestra oficina durante el horario de atencin regular y recoger una tarjeta de cupones de GoodRx.  - Si necesita que su receta se enve electrnicamente a una farmacia diferente, informe a nuestra oficina a travs de MyChart de [place]   o por telfono llamando al [phone] y presione la opcin 4.  

## 2022-05-19 NOTE — Progress Notes (Signed)
    Follow-Up Visit   Subjective  [name] is a 88 y.o. male who presents for the following: Follow-up (Patient here today for stasis follow up and unna boots. ).  Patient saw Dr. Nehemiah Massed and had unna boots placed on 2 days ago.   The following portions of the chart were reviewed this encounter and updated as appropriate:   Tobacco  Allergies  Meds  Problems  Med Hx  Surg Hx  Fam Hx      Review of Systems:  No other skin or systemic complaints except as noted in HPI or Assessment and Plan.  Objective  Well appearing patient in no apparent distress; mood and affect are within normal limits.  A focused examination was performed including lower legs. Relevant physical exam findings are noted in the Assessment and Plan.  bilateral lower legs Erythematous patches with few erosions, 2+ pitting edema, 2+ pedal pulses bilaterally    Assessment & Plan  Venous stasis dermatitis of left lower extremity bilateral lower legs  With lymphedema  Chronic and persistent condition with duration or expected duration over one year. Condition is bothersome/symptomatic for patient. Currently flared.  Unna boots removed, legs cleaned with Puracyn. Mupirocin applied to open areas at right lower leg and covered with Duoderm, followed by Vaseline to entire lower legs and feet.  Unna Boots applied to L and R lower legs, followed by coban wrap.  Stasis in the legs causes chronic leg swelling, which may result in itchy or painful rashes, skin discoloration, skin texture changes, and sometimes ulceration.  Recommend daily graduated compression hose/stockings- easiest to put on first thing in morning, remove at bedtime.  Elevate legs as much as possible. Avoid salt/sodium rich foods.   Related Procedures Remv/revisn boot/body cast Remv/revisn boot/body cast  Called wound clinic/lymphedema clinic and they are unable to get patient in until September   Return in about 4 days (around  05/19/2022).  [name], RMA, am acting as scribe for Forest Gleason, MD .  Documentation: I have reviewed the above documentation for accuracy and completeness, and I agree with the above.  Forest Gleason, MD

## 2022-05-20 ENCOUNTER — Encounter: Payer: Self-pay | Admitting: Dermatology

## 2022-05-22 ENCOUNTER — Ambulatory Visit: Payer: Medicare Other | Admitting: Dermatology

## 2022-05-22 DIAGNOSIS — I890 Lymphedema, not elsewhere classified: Secondary | ICD-10-CM | POA: Diagnosis not present

## 2022-05-22 DIAGNOSIS — I872 Venous insufficiency (chronic) (peripheral): Secondary | ICD-10-CM

## 2022-05-22 DIAGNOSIS — L03119 Cellulitis of unspecified part of limb: Secondary | ICD-10-CM

## 2022-05-22 DIAGNOSIS — L03116 Cellulitis of left lower limb: Secondary | ICD-10-CM | POA: Diagnosis not present

## 2022-05-22 DIAGNOSIS — L03115 Cellulitis of right lower limb: Secondary | ICD-10-CM | POA: Diagnosis not present

## 2022-05-22 NOTE — Patient Instructions (Addendum)
Recommend compression stockings on legs daily until your appointment lymphedema clinic       Due to recent changes in healthcare laws, you may see results of your pathology and/or laboratory studies on MyChart before the doctors have had a chance to review them. We understand that in some cases there may be results that are confusing or concerning to you. Please understand that not all results are received at the same time and often the doctors may need to interpret multiple results in order to provide you with the best plan of care or course of treatment. Therefore, we ask that you please give [place] 2 business days to thoroughly review all your results before contacting the office for clarification. Should we see a critical lab result, you will be contacted sooner.   If You Need Anything After Your Visit  If you have any questions or concerns for your doctor, please call our main line at [phone] and press option 4 to reach your doctor's medical assistant. If no one answers, please leave a voicemail as directed and we will return your call as soon as possible. Messages left after 4 pm will be answered the following business day.   You may also send [place] a message via [place]. We typically respond to MyChart messages within 1-2 business days.  For prescription refills, please ask your pharmacy to contact our office. Our fax number is [phone].  If you have an urgent issue when the clinic is closed that cannot wait until the next business day, you can page your doctor at the number below.    Please note that while we do our best to be available for urgent issues outside of office hours, we are not available 24/7.   If you have an urgent issue and are unable to reach [place], you may choose to seek medical care at your doctor's office, retail clinic, urgent care center, or emergency room.  If you have a medical emergency, please immediately call 911 or go to the emergency department.  Pager  Numbers  - Dr. Nehemiah Massed: [phone]  - Dr. Laurence Ferrari: [phone]  - Dr. Nicole Kindred: [phone]  In the event of inclement weather, please call our main line at [phone] for an update on the status of any delays or closures.  Dermatology Medication Tips: Please keep the boxes that topical medications come in in order to help keep track of the instructions about where and how to use these. Pharmacies typically print the medication instructions only on the boxes and not directly on the medication tubes.   If your medication is too expensive, please contact our office at [phone] option 4 or send [place] a message through [place].   We are unable to tell what your co-pay for medications will be in advance as this is different depending on your insurance coverage. However, we may be able to find a substitute medication at lower cost or fill out paperwork to get insurance to cover a needed medication.   If a prior authorization is required to get your medication covered by your insurance company, please allow [place] 1-2 business days to complete this process.  Drug prices often vary depending on where the prescription is filled and some pharmacies may offer cheaper prices.  The website www.goodrx.com contains coupons for medications through different pharmacies. The prices here do not account for what the cost may be with help from insurance (it may be cheaper with your insurance), but the website can give you the price if  you did not use any insurance.  - You can print the associated coupon and take it with your prescription to the pharmacy.  - You may also stop by our office during regular business hours and pick up a GoodRx coupon card.  - If you need your prescription sent electronically to a different pharmacy, notify our office through [facility] or by phone at [phone] option 4.     Si Usted Necesita Algo Despus de Su Visita  Tambin puede enviarnos un mensaje a travs de  Pharmacist, community. Por lo general respondemos a los mensajes de MyChart en el transcurso de 1 a 2 das hbiles.  Para renovar recetas, por favor pida a su farmacia que se ponga en contacto con nuestra oficina. Harland Dingwall de fax es [place] [phone].  Si tiene un asunto urgente cuando la clnica est cerrada y que no puede esperar hasta el siguiente da hbil, puede llamar/localizar a su doctor(a) al nmero que aparece a continuacin.   Por favor, tenga en cuenta que aunque hacemos todo lo posible para estar disponibles para asuntos urgentes fuera del horario de [place], no estamos disponibles las 24 horas del da, los 7 das de la [place].   Si tiene un problema urgente y no puede comunicarse con nosotros, puede optar por buscar atencin mdica  en el consultorio de su doctor(a), en una clnica privada, en un centro de atencin urgente o en una sala de emergencias.  Si tiene Engineering geologist, por favor llame inmediatamente al 911 o vaya a la sala de emergencias.  Nmeros de bper  - Dr. Nehemiah Massed: [phone]  - Dra. Moye: [phone]  - Dra. Nicole Kindred: [phone]  En caso de inclemencias del [place], por favor llame a Johnsie Kindred principal al [phone] para una actualizacin sobre el [place] de cualquier retraso o cierre.  Consejos para la medicacin en dermatologa: Por favor, guarde las cajas en las que vienen los medicamentos de uso tpico para ayudarle a seguir las instrucciones sobre dnde y cmo usarlos. Las farmacias generalmente imprimen las instrucciones del medicamento slo en las cajas y no directamente en los tubos del [place].   Si su medicamento es muy caro, por favor, pngase en contacto con Zigmund Daniel llamando al [phone] y presione la opcin 4 o envenos un mensaje a travs de Pharmacist, community.   No podemos decirle cul ser su copago por los medicamentos por adelantado ya que esto es diferente dependiendo de la cobertura de su seguro. Sin embargo, es posible que  podamos encontrar un medicamento sustituto a Electrical engineer un formulario para que el seguro cubra el medicamento que se considera necesario.   Si se requiere una autorizacin previa para que su compaa de seguros [place] su medicamento, por favor permtanos de 1 a 2 das hbiles para completar este proceso.  Los precios de los medicamentos varan con frecuencia dependiendo del Environmental consultant de dnde se surte la receta y alguna farmacias pueden ofrecer precios ms baratos.  El sitio web www.goodrx.com tiene cupones para medicamentos de Airline pilot. Los precios aqu no tienen en cuenta lo que podra costar con la ayuda del seguro (puede ser ms barato con su seguro), pero el sitio web puede darle el precio si no utiliz Research scientist (physical sciences).  - Puede imprimir el cupn correspondiente y llevarlo con su receta a la farmacia.  - Tambin puede pasar por nuestra oficina durante el horario de atencin regular y Charity fundraiser una tarjeta de cupones de GoodRx.  - Si necesita que su receta  se enve electrnicamente a una farmacia diferente, informe a nuestra oficina a travs de MyChart de [place] o por telfono llamando al [phone] y presione la opcin 4.

## 2022-05-22 NOTE — Progress Notes (Signed)
   Follow-Up Visit   Subjective  [name] is a 88 y.o. male who presents for the following: stasis dermatitis of both legs (3 day follow up, patient reports has seen some improvement. Has been taking doxycycline 100 mg for 9 days and has 1 more pill left. Removed unna boots this morning. Still has not heard from Lymphedema Clinic following referral sent. ).  The following portions of the chart were reviewed this encounter and updated as appropriate:  Tobacco  Allergies  Meds  Problems  Med Hx  Surg Hx  Fam Hx     Review of Systems: No other skin or systemic complaints except as noted in HPI or Assessment and Plan.  Objective  Well appearing patient in no apparent distress; mood and affect are within normal limits.  A focused examination was performed including bilateral lower legs. Relevant physical exam findings are noted in the Assessment and Plan.  b/l lower legs Erythematous, scaly patches involving the ankle and distal lower leg with associated lower leg edema.    Assessment & Plan  Stasis dermatitis of both legs b/l lower legs With Lymphedema and cellulitis Stasis in the legs causes chronic leg swelling, which may result in itchy or painful rashes, skin discoloration, skin texture changes, and sometimes ulceration.  Recommend daily graduated compression hose/stockings- easiest to put on first thing in morning, remove at bedtime.  Elevate legs as much as possible. Avoid salt/sodium rich foods. Chronic and persistent condition with duration or expected duration over one year. Condition is bothersome/symptomatic for patient. Currently flared, but improving   Continue Doxycycline '100mg'$  until complete  Start wearing compression stockings in the am qd and remove nightly before bed.  We did not put in a boots on today as patient states he can start wearing his compression stockings.  If he worsens and feels like he needs the Unna boot reapplied.  He may call for appointment  prior to his 2-week follow-up.  Changed referral to lymphedema clinic to Urgent. Spoke with Melissa at Lymphedema clinic, she changed status to urgent and will try to fit patient in for appointment within the next 2 weeks.  Will follow up with patient in 2 weeks.    Doxycycline should be taken with food to prevent nausea. Do not lay down for 30 minutes after taking. Be cautious with sun exposure and use good sun protection while on this medication. Pregnant women should not take this medication.    Related Medications doxycycline (MONODOX) 100 MG capsule Take 1 capsule (100 mg total) by mouth 2 (two) times daily for 10 days. Take with food and drink  Return in about 2 weeks (around 06/05/2022) for stasis dermatitis follow up on bilateral legs . IRuthell Rummage, CMA, am acting as scribe for [name], MD. Documentation: I have reviewed the above documentation for accuracy and completeness, and I agree with the above.  [name], MD

## 2022-05-24 ENCOUNTER — Encounter: Payer: Self-pay | Admitting: Dermatology

## 2022-05-24 ENCOUNTER — Encounter: Payer: Self-pay | Admitting: Internal Medicine

## 2022-05-24 DIAGNOSIS — I872 Venous insufficiency (chronic) (peripheral): Secondary | ICD-10-CM | POA: Insufficient documentation

## 2022-05-25 ENCOUNTER — Encounter: Payer: Self-pay | Admitting: Dermatology

## 2022-05-26 ENCOUNTER — Ambulatory Visit: Payer: Medicare Other | Admitting: Occupational Therapy

## 2022-06-03 DIAGNOSIS — H18421 Band keratopathy, right eye: Secondary | ICD-10-CM | POA: Diagnosis not present

## 2022-06-05 ENCOUNTER — Ambulatory Visit: Payer: Medicare Other | Admitting: Dermatology

## 2022-06-10 ENCOUNTER — Ambulatory Visit: Payer: Medicare Other | Attending: Dermatology | Admitting: Occupational Therapy

## 2022-06-10 DIAGNOSIS — I890 Lymphedema, not elsewhere classified: Secondary | ICD-10-CM | POA: Insufficient documentation

## 2022-06-11 ENCOUNTER — Ambulatory Visit: Payer: Medicare Other | Admitting: Occupational Therapy

## 2022-06-11 ENCOUNTER — Encounter: Payer: Self-pay | Admitting: Occupational Therapy

## 2022-06-11 DIAGNOSIS — Z7982 Long term (current) use of aspirin: Secondary | ICD-10-CM | POA: Diagnosis not present

## 2022-06-11 DIAGNOSIS — I110 Hypertensive heart disease with heart failure: Secondary | ICD-10-CM | POA: Diagnosis not present

## 2022-06-11 DIAGNOSIS — Z79899 Other long term (current) drug therapy: Secondary | ICD-10-CM | POA: Diagnosis not present

## 2022-06-11 DIAGNOSIS — J9622 Acute and chronic respiratory failure with hypercapnia: Secondary | ICD-10-CM | POA: Diagnosis not present

## 2022-06-11 DIAGNOSIS — Z882 Allergy status to sulfonamides status: Secondary | ICD-10-CM | POA: Diagnosis not present

## 2022-06-11 DIAGNOSIS — I890 Lymphedema, not elsewhere classified: Secondary | ICD-10-CM

## 2022-06-11 DIAGNOSIS — G7000 Myasthenia gravis without (acute) exacerbation: Secondary | ICD-10-CM | POA: Diagnosis not present

## 2022-06-11 DIAGNOSIS — E7800 Pure hypercholesterolemia, unspecified: Secondary | ICD-10-CM | POA: Diagnosis not present

## 2022-06-11 DIAGNOSIS — E876 Hypokalemia: Secondary | ICD-10-CM | POA: Diagnosis not present

## 2022-06-11 DIAGNOSIS — G7080 Lambert-Eaton syndrome, unspecified: Secondary | ICD-10-CM | POA: Diagnosis not present

## 2022-06-11 DIAGNOSIS — J9601 Acute respiratory failure with hypoxia: Secondary | ICD-10-CM | POA: Diagnosis not present

## 2022-06-11 DIAGNOSIS — Z20822 Contact with and (suspected) exposure to covid-19: Secondary | ICD-10-CM | POA: Diagnosis not present

## 2022-06-11 DIAGNOSIS — I2510 Atherosclerotic heart disease of native coronary artery without angina pectoris: Secondary | ICD-10-CM | POA: Diagnosis not present

## 2022-06-11 DIAGNOSIS — I5023 Acute on chronic systolic (congestive) heart failure: Secondary | ICD-10-CM | POA: Diagnosis not present

## 2022-06-11 DIAGNOSIS — I872 Venous insufficiency (chronic) (peripheral): Secondary | ICD-10-CM | POA: Diagnosis present

## 2022-06-11 DIAGNOSIS — Z87891 Personal history of nicotine dependence: Secondary | ICD-10-CM | POA: Diagnosis not present

## 2022-06-11 DIAGNOSIS — Z515 Encounter for palliative care: Secondary | ICD-10-CM | POA: Diagnosis not present

## 2022-06-11 DIAGNOSIS — I214 Non-ST elevation (NSTEMI) myocardial infarction: Secondary | ICD-10-CM | POA: Diagnosis not present

## 2022-06-11 DIAGNOSIS — Z23 Encounter for immunization: Secondary | ICD-10-CM | POA: Diagnosis not present

## 2022-06-11 DIAGNOSIS — I5043 Acute on chronic combined systolic (congestive) and diastolic (congestive) heart failure: Secondary | ICD-10-CM | POA: Diagnosis not present

## 2022-06-11 DIAGNOSIS — I509 Heart failure, unspecified: Secondary | ICD-10-CM | POA: Diagnosis not present

## 2022-06-11 DIAGNOSIS — Z881 Allergy status to other antibiotic agents status: Secondary | ICD-10-CM | POA: Diagnosis not present

## 2022-06-11 DIAGNOSIS — J811 Chronic pulmonary edema: Secondary | ICD-10-CM | POA: Diagnosis not present

## 2022-06-11 DIAGNOSIS — Z789 Other specified health status: Secondary | ICD-10-CM | POA: Diagnosis not present

## 2022-06-11 DIAGNOSIS — I083 Combined rheumatic disorders of mitral, aortic and tricuspid valves: Secondary | ICD-10-CM | POA: Diagnosis not present

## 2022-06-11 DIAGNOSIS — Z79624 Long term (current) use of inhibitors of nucleotide synthesis: Secondary | ICD-10-CM | POA: Diagnosis not present

## 2022-06-11 DIAGNOSIS — J9621 Acute and chronic respiratory failure with hypoxia: Secondary | ICD-10-CM | POA: Diagnosis not present

## 2022-06-11 DIAGNOSIS — Z8551 Personal history of malignant neoplasm of bladder: Secondary | ICD-10-CM | POA: Diagnosis not present

## 2022-06-11 DIAGNOSIS — Z85828 Personal history of other malignant neoplasm of skin: Secondary | ICD-10-CM | POA: Diagnosis not present

## 2022-06-11 DIAGNOSIS — J90 Pleural effusion, not elsewhere classified: Secondary | ICD-10-CM | POA: Diagnosis not present

## 2022-06-11 DIAGNOSIS — Z888 Allergy status to other drugs, medicaments and biological substances status: Secondary | ICD-10-CM | POA: Diagnosis not present

## 2022-06-11 DIAGNOSIS — N400 Enlarged prostate without lower urinary tract symptoms: Secondary | ICD-10-CM | POA: Diagnosis not present

## 2022-06-11 DIAGNOSIS — R0602 Shortness of breath: Secondary | ICD-10-CM | POA: Diagnosis not present

## 2022-06-11 NOTE — Therapy (Signed)
[place] MAIN [facility] SERVICES [address] [place], [place], [address] Phone: [phone]   Fax:  [phone]  Occupational Therapy Evaluation  Patient Details  Name: ROY TOKARZ MRN: [id number] Date of Birth: September 01, 1926 Referring Provider (OT): [name], MD   Encounter Date: 06/10/2022   OT End of Session - 06/11/22 1337     Visit Number 1    Number of Visits 36    Date for OT Re-Evaluation 09/09/22    OT Start Time 0300    OT Stop Time 0425    OT Time Calculation (min) 85 min    Activity Tolerance Patient tolerated treatment well;No increased pain    Behavior During Therapy [facility] for tasks assessed/performed             Past Medical History:  Diagnosis Date   Abnormal chest CT 03/28/2017   Allergy    Anemia    Angina pectoris ([place]) 04/02/2011   Aortic aneurysm ([place]) 03/28/2017   Saw Dr Genevive Bi 04/16/17 - recommended f/u CT in 3 months.     Atherosclerosis of both carotid arteries 07/17/2014   Basal cell carcinoma 06/25/2021   Left ant neck. Nodulocystic pattern, close to margin   Benign lipomatous neoplasm of skin and subcutaneous tissue of left arm 10/04/2013   Bilateral carotid artery stenosis 08/30/2014   Overview:  Less than 50% 2015   Bladder cancer ([place]) 03/05/2013   CAD (coronary artery disease) 06/18/2014   Cancer ([place]) 11/24/2012   bladder. BCG treatments by Dr Jacqlyn Larsen.   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 WD SCC. Re-shaved 01/14/2016. Excised 02/05/2016, margins free.   Squamous cell carcinoma of skin 11/03/2018   Right cheek ant. to sideburn. Poorly differentiated.   Squamous cell carcinoma of skin 02/02/2019   Right mid dorsum forearm. WD SCC. EDC.    Past Surgical History:  Procedure Laterality Date   Orient    There were no vitals filed for this visit.   Subjective Assessment - 06/11/22 1345     Subjective  Mr. Mouhamad Teed is referred to Occupational Therapy by Ralene Bathe, MD for evaluation and treatment of exacerbated BLE phlebo-lymphedema after a recent episode of cellulitis. He is accompanied by his daughter, [name]. He denies leg pain. BLE swelling is significantly increased below the knees since last seen on 03/19/21 .  Pt states he finished a course of oral antibiotics about 10 days ago. Mr. Agena is well known to this OT as he successfully completed a course of Complete Decongestive Therapy (CDT) to BLE from4/4/22 and 03/19/21. Pt achieved 20.96% R leg limb  volume reduction and a 13.98% reduction in the L leg. Pt  obtained recommended compression stockings (OTS ccl 2 (30-40 mmHg) Juzo 3512 A-D) and wore them consistently as directed using a donning butler to assist with putting stockings on each day. Unfortuneately Mr. Boehringer did not replace stockings as directed and eventually stopped wearing them conasistently. Garments should be replaced q 3-6 months and PRN. Pt's gfoal for OT is to get leg swelling and associated pain  back down to limit recurrent infection and progression.    Patient is accompanied by: Family member   daughter, Tulio Facundo   Pertinent History chronic BLE lymphedema (LE) and associated pain/ discomfort. Altered sensation/numbness LLE, Hx lymphorrhea, hx non-pressure related leg ulcer, stasis dermatitis, AORTIC ANEURISM, BILATERAL CAROTID STENOSIS, BLADDER CANCER 03/05/13, CAD, CHF. htn, s/p squamous cell carcinoma 2020 x 2, 20117    Limitations decreased endurance, impaired balance, chronic leg swelling w associated pain, hx skin ulcer, limited knowledge of DME,decreased hip and knee AROM- difficulty reaching feet;    Repetition Increases Symptoms    Special Tests +Stemmer bilaterally, FOTO TBA Rx visit 1    Patient Stated Goals "get this swelling down and keep it from getting worse again    Currently in Pain? Yes    Pain Score 5     Pain Location Leg    Pain Orientation Right;Left    Pain Descriptors / Indicators Aching;Sore;Squeezing;Tender;Tightness;Discomfort;Pressure;Heaviness    Pain Type Chronic pain    Pain Onset Other (comment)   exacerbated by recent episode of cellulitis reoccurence   Pain Frequency Intermittent    Aggravating Factors  standing, walking, dependent sitting    Pain Relieving Factors elevation, compression    Effect of Pain on Daily Activities limits ability to fit street shoes and LB clothing,               [facility] OT Assessment - 06/11/22 1424       Assessment   Medical Diagnosis Moderate  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 Pt underwent CDT with this OT in 2022, ad achied LLE volume reduction of 26% and RLE decrease measuring 13%. Pt modified assist w/ donning butler ; Existing stockings worn out and need replacement      Precautions   Precautions Other (comment)   NO NECK MLD 2/2 CAROTID BLOCKAGEs     Restrictions   Weight Bearing Restrictions No      Balance Screen   Has the patient fallen in the past 6 months No    Has the patient had a decrease in activity level  because of a fear of falling?  No    Is the patient reluctant to leave their home because of a fear of falling?  No      Home  Environment   Family/patient expects to be discharged to: Private residence    Living Arrangements Alone    Available Help at Discharge Family    Type of Gate with basic ADLs;Independent with household mobility without device;Independent with community mobility without device;Independent with gait;Independent with transfers;Needs assistance with ADLs    Vocation Retired    [employer] very active 40 y old man. 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 and farm, light home management tasks and yard work    Leisure [employer]  Prior Level of Function Shopping independent for small purchases    Shopping Needs to be accompanied on any shopping trip    Prior Level of Function Light Housekeeping Mod A    Light Housekeeping Does personal laundry completely;Launders small items, rinses stockings, etc.;Performs light daily tasks such as dishwashing, bed making;Performs light daily tasks but cannot maintain acceptable level of cleanliness    Prior Level of Function Meal Prep Independent    Meal Prep Able to complete simple cold meal and snack prep;Able to complete simple warm meal prep;Prepares adequate meal if supplied with ingredients   extra time and seated rest  breaks   Prior Level of Function Community Mobility Idriving own car close to home    Community Mobility Relies on family or friends for transportation      Mobility   Mobility Status Independent      Vision Assessment   Eye Alignment Impaired (comment)   blind in R eye     Activity Tolerance   Activity Tolerance Tolerates 10-20 min activity with multiple rests    Sitting Balance --   WFL     Cognition   Overall Cognitive Status Within Functional Limits for tasks assessed      Observation/Other Assessments   Focus on Therapeutic Outcomes (FOTO)   TBA    Outcome Measures BLE comparative limb volumetrics      Posture/Postural Control   Posture/Postural Control Postural limitations    Postural Limitations Rounded Shoulders;Forward head;Posterior pelvic tilt;Increased thoracic kyphosis      Sensation   Light Touch Appears Intact      Coordination   Gross Motor Movements are Fluid and Coordinated Yes    Fine Motor Movements are Fluid and Coordinated Yes      AROM   Overall AROM  Deficits      Strength   Overall Strength Deficits    Overall Strength Comments generalized weakness 2/2 aging muscle mass      Hand Function   Right Hand Gross Grasp Functional    Left Hand Gross Grasp Functional             Moderate, Stage  II, Bilateral Lower Extremity Phlebo Lymphedema 2/2 CVI   Skin  Description Hyper-Keratosis Peau' de Orange Shiny Tight Fibrotic/ Indurated Fatty Doughy Spongy/ boggy   x  x x L>R   x   Skin dry Flaky WNL Macerated   mildly x     Color Redness Varicosities Blanching Hemosiderin Stain Mottled   x x  Light      Odor Malodorous Yeast Fungal infection  WNL      x   Temperature Warm Cool wnl    x     Pitting Edema   1+ 2+ 3+ 4+ Non-pitting         x   Girth Symmetrical Asymmetrical                   Distribution    L>R oes to groin    Stemmer Sign Positive Negative   Strong +    Lymphorrhea History Of:  Present Absent   x      Wounds History Of Present Absent Venous Arterial Pressure Sheer   x          Signs of Infection Redness Warmth Erythema Acute Swelling Drainage Borders   x                 Sensation Light Touch Deep pressure Hypersensitivty   Present  Impaired Present Impaired Absent Impaired   x  x       Nails WNL   Fungus nail dystrophy        Hair Growth Symmetrical Asymmetrical   x    Skin Creases Base of toes  Ankles   Base of Fingers knees       Abdominal pannus Thigh Lobules  Face/neck   x x                OT Treatments/Exercises (OP) -  06/11/22 1424       ADLs   ADL Education Given Yes      Manual Therapy   Manual Therapy Edema management;Manual Lymphatic Drainage (MLD)    Compression Bandaging knee length. short stretch compression wraps applied to LLE using 8 and 10 cm wide short stretch wraps in gradient configuration over stockinett and 1 layer of Rosidal 0.4 cm thick foam. Stockinett and single layer of foam from distal ankle to popliteal on the L with loaned  adjustable, Velcro style leg wrap to the RLE.                    OT Education - 06/11/22 1425     Education Details Provided Pt/ family education regarding lymphatic structure and function, etiology, onset patterns and stages of progression. Taught interaction between blood circulatory system and lymphatic circulation.Discussed  impact of gravity and co-morbidities on lymphatic function. Outlined Complete Decongestive Therapy (CDT)  as standard of care and provided in depth information regarding 4 primary components of Intensive and Self Management Phases, including Manual Lymph Drainage (MLD), compression wrapping and garments, skin care, and therapeutic exercise. Pilar Plate discussion with re need for frequent attendance and high burden of care when caregiver is needed, impact of co morbidities. We discussed  the chronic, progressive nature of lymphedema and Importance of daily, ongoing LE self-care essential for limiting progression and infection risk. Discussed the demanding nature of CDT and importance of high compliance with all home program components for optimal outcome.Discussed need to have daily caregiver assistance with lymphedema self-care home program throughout Intensive Phase of CDT due to Pt's inability to reach feet and distal legs to correctly apply compression wraps and to perform effective skin care daily. Pt and caregiver agree with this recommendation.    Person(s) Educated Patient;Caregiver(s)    Methods Explanation;Demonstration;Handout     Comprehension Verbalized understanding;Returned demonstration;Need further instruction                 OT Long Term Goals - 06/11/22 1437       OT LONG TERM GOAL #1   Title Given this patient's risk-adjustment variables, and is Intake Functional Status score of /100 on the FOTO tool, patient will experience at least an increase in function of 5 points, or higher.    Baseline TBA    Time 12    Period Weeks    Status New   unable to assess on 03/19/21 for 19th visit progress note due to technology failure. Will assess at 3 month f/u.   Target Date 09/09/22      OT LONG TERM GOAL #2   Title Pt will be able to apply knee length, multi-layer, short stretch compression wraps using correct gradient techniques with max caregiver Assistance  to decrease limb volume and associated  leg pain and limit infection risk,    Baseline dependent    Time 4    Period Days    Status New   Max  A from caregiver required   Target Date --   4th OT Rx visit     OT LONG TERM GOAL #3   Title Patient will demonstrate understanding of LE precautions, prevention strategies and cellulitis signs and symptoms by verbalizing at least 4 examples using  printed reference( modified independence)  to limit infection risk, recurrent wounds and LE progression.    Baseline Max A    Time 4    Period Days    Status New    Target Date --   4th OT rx visit     OT LONG TERM GOAL #4   Title Pt will achieve 5%, or greater,  limb volume reduction on the R below the knee, and 10% reduction on the L below the knee (ankle to tibial tuberosity = A-D) during Intensive Phase CDT to achieve optimal limb volume reduction and to prevent re-accumulation of lymphatic congestion and fibrosis in the legs, to limit infection risk, to improve functional ambulation and transfers, to improve functional performance of basic and instrumental ADLs, and to limit LE progression.    Baseline Max A    Time 12    Period Weeks    Status New    03/19/21: RLE reduction = 20.96% from ankle to popliteal (A-D). LLE volume increase = 13.98% since 01/02/21.   Target Date 09/09/22      OT LONG TERM GOAL #5   Title With max CG assistance Pt will demonstrate and sustain a least 85% compliance performing all daily LE self-care home program components daily throughout Intensive Phase CDT, including recommended skin care regime, lymphatic pumping ther ex, 23/7 compression wraps and simple self-MLD, to ensure optimal limb volume reduction, to limit infection risk and to limit LE progression.    Baseline dependent    Time 12    Period Weeks    Status New   Pt has had difficulty with compliance with compression wraps to date. He has not been able to apply wraps himself. A very helpful friend/ neighbor learned to apply wraps at last visit. She did apply them this past weekend   Target Date 09/09/22      OT LONG TERM GOAL #6   Title Using assistive devices and additional time  (modified independence)  Pt will be able to don and doff appropriate daytime compression garments and HOS devices to limit lymphatic re-accumulation and LE progression with before transitioning to self-management phase of CDT.    Baseline Max A    Time 12    Period --   issue date   Status New                   Plan - 06/11/22 1430     Clinical Impression Statement Ajahni Nay is a 88 yo male presenting with moderate BLE phlebo-lymphedema. Pt returns for treatment due to recent exacerbation of this chronic, progressive condition due to an episode of lower extremity cellulitis. Comorbidities contributing to systemic fluid strain on delicate lymphatics include CHF, HTN and non-pressure related leg ulcers.  Swelling is concentrated below the knees w/ L>R. Edema is protein rich with 3+ pitting and dense subcutaneous fibrosis and induration.  Skin is cool to the touch with venostasis changes including redness, hemosiderin staining, skin creases and hyperkeratosis.  LE-associated swelling and pain worsens with all functional activities requiring standing, walking and/ or dependent sitting > 10 minutes. Chronic, progressive LE limits functional ambulation and transfers, and limits performance in all occupational domains, including basic and  instrumental ADLs, leisure pursuits and productive/ work activities and social participation. Mr. Rothert will benefit from skilled Occupational Therapy for Intensive and Management Phase Complete Decongestive Therapy (CDT) to include manual lymphatic drainage (MLD), skin care, therapeutic exercise and compression therapies. CDT is medically necessary to reduce limb swelling and associated pain, to limit progression of the condition and to reduce infection risk. Without skilled OT for CDT LE will progress and further functional decline is expected. A daily caregiver is required   between clinic visits to ensure optimal outcome due to Pt's inability to apply compression wraps independently.    OT Occupational Profile and History Detailed Assessment- Review of Records and additional review of physical, cognitive, psychosocial history related to current functional performance    Occupational performance deficits (Please refer to evaluation for details): ADL's;IADL's;Work;Leisure;Social Participation    Body Structure / Function / Physical Skills ADL;Decreased knowledge of use of DME;Balance;Pain;Edema;Endurance;IADL;Scar mobility;Mobility;Decreased knowledge of precautions;Skin integrity    Rehab Potential Good    Clinical Decision Making Several treatment options, min-mod task modification necessary    Comorbidities Affecting Occupational Performance: Presence of comorbidities impacting occupational performance    Comorbidities impacting occupational performance description: CVI, CAD, hx non-pressure related leg ulcer    Modification or Assistance to Complete Evaluation  Min-Moderate modification of tasks or assist with assess necessary  to complete eval    OT Frequency 3x / week    OT Duration 12 weeks    OT Treatment/Interventions Self-care/ADL training;DME and/or AE instruction;Manual lymph drainage;Compression bandaging;Therapeutic activities;Therapeutic exercise;Scar mobilization;Other (comment);Manual Therapy;Patient/family education    Plan Complete Decongestive Therapy (CDT) Pt and CG will be taught to apply multilayer gradient compression wraps below knee to base of toes. CDT also to include manual lymphatic drainage, skin care, therapeutic exercise and fitting with appropriate knee length compression garments that are comfortable and Pt is able to don and doff using assistive devices PRN.    Recommended Other Services --    Consulted and Agree with Plan of Care Patient;Family member/caregiver    Family Member Consulted daughter, [name]             Patient will benefit from skilled therapeutic intervention in order to improve the following deficits and impairments:   Body Structure / Function / Physical Skills: ADL, Decreased knowledge of use of DME, Balance, Pain, Edema, Endurance, IADL, Scar mobility, Mobility, Decreased knowledge of precautions, Skin integrity       Visit Diagnosis: Lymphedema, not elsewhere classified    Problem List Patient Active Problem List   Diagnosis Date Noted   Stasis dermatitis 05/24/2022   Acute on chronic combined systolic and diastolic CHF (congestive heart failure) ([place]) 09/28/2021   Benign prostatic hyperplasia 09/28/2021   Rectal bleeding 02/24/2021   Rectal irritation 11/25/2020   Lymphedema 11/19/2020   Pressure injury of right buttock, stage 2 ([place]) 04/23/2020   Lower limb ulcer, calf, left, limited to breakdown of skin ([place]) 12/27/2019   Dysuria 06/30/2019   Hyperglycemia 06/30/2019   LVH (left ventricular hypertrophy) due to hypertensive disease, without heart failure 05/26/2019   Pseudophakia, left eye 11/26/2018   Venous insufficiency of both lower extremities  10/13/2018   Swelling of limb 10/12/2018   Bilateral lower extremity edema 10/10/2018   Absent pulse in lower extremity 10/10/2018   Corneal thinning of right eye 07/17/2018   Eye pain 05/31/2018   Mild aortic valve stenosis 12/03/2017   Epididymoorchitis 09/02/2017   Hydrocele, right 08/11/2017   Aortic aneurysm ([place]) 03/28/2017   Abnormal chest CT  03/28/2017   Acute respiratory failure with hypoxia ([facility]) 12/27/2016   Hypotension    Dizziness 12/22/2016   Leg weakness 03/11/2016   Skin lesion 09/11/2015   Moderate mitral insufficiency 05/18/2015   Moderate tricuspid insufficiency 05/18/2015   Essential hypertension 04/25/2015   Rash 03/10/2015   Health care maintenance 03/10/2015   Bilateral carotid artery stenosis 08/30/2014   Atherosclerosis of both carotid arteries 59/93/5701   Chronic systolic CHF (congestive heart failure), NYHA class 3 ([place]) 07/17/2014   Coronary artery disease involving native coronary artery of native heart without angina pectoris 06/18/2014   CHF (congestive heart failure) ([place]) 04/20/2014   Congestive heart failure ([place]) 04/20/2014   SOB (shortness of breath) 03/10/2014   Abnormal EKG 03/10/2014   Mixed hyperlipidemia 03/10/2014   Benign lipomatous neoplasm of skin and subcutaneous tissue of left arm 10/04/2013   Arm skin lesion, left 09/04/2013   History of shingles 09/04/2013   History of bladder cancer 03/05/2013   Chronic prostatitis 09/30/2012   Elevated prostate specific antigen (PSA) 09/30/2012   Family history of malignant neoplasm of prostate 09/30/2012   Gross hematuria 09/30/2012   Incomplete emptying of bladder 09/30/2012   Anemia 08/30/2012   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

## 2022-06-11 NOTE — Patient Instructions (Signed)

## 2022-06-12 ENCOUNTER — Ambulatory Visit: Payer: Medicare Other | Admitting: Occupational Therapy

## 2022-06-12 ENCOUNTER — Encounter: Payer: Self-pay | Admitting: Occupational Therapy

## 2022-06-12 DIAGNOSIS — I890 Lymphedema, not elsewhere classified: Secondary | ICD-10-CM

## 2022-06-12 NOTE — Therapy (Signed)
[facility] MAIN [facility] SERVICES [address] [place], [place], [address] Phone: [phone]   Fax:  [phone]  Occupational Therapy Treatment  Patient Details  Name: [name] MRN: [id number] Date of Birth: 05-30-1926 Referring Provider (OT): Joel Ser, MD   Encounter Date: 06/11/2022   OT End of Session - 06/11/22 1251     Visit Number 2    Number of Visits 36    Date for OT Re-Evaluation 09/09/22    OT Start Time 1000    OT Stop Time 1110    OT Time Calculation (min) 70 min    Activity Tolerance Patient tolerated treatment well;No increased pain    Behavior During Therapy [facility] for tasks assessed/performed             Past Medical History:  Diagnosis Date   Abnormal chest CT 03/28/2017   Allergy    Anemia    Angina pectoris ([place]) 04/02/2011   Aortic aneurysm ([place]) 03/28/2017   Saw Dr [name] 04/16/17 - recommended f/u CT in 3 months.     Atherosclerosis of both carotid arteries 07/17/2014   Basal cell carcinoma 06/25/2021   Left ant neck. Nodulocystic pattern, close to margin   Benign lipomatous neoplasm of skin and subcutaneous tissue of left arm 10/04/2013   Bilateral carotid artery stenosis 08/30/2014   Overview:  Less than 50% 2015   Bladder cancer ([place]) 03/05/2013   CAD (coronary artery disease) 06/18/2014   Cancer ([place]) 11/24/2012   bladder. BCG treatments by Dr [name].   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 WD SCC. Re-shaved 01/14/2016. Excised 02/05/2016, margins free.   Squamous cell carcinoma of skin 11/03/2018   Right cheek ant. to sideburn. Poorly differentiated.   Squamous cell carcinoma of skin 02/02/2019   Right mid dorsum forearm. WD SCC. EDC.    Past Surgical History:  Procedure Laterality Date   [place]    There were no vitals filed for this visit.   Subjective Assessment - 06/11/22 1256     Subjective  [name] presents for OT Rx visit to address BLE lymphedema, L>R. He is accompanied tby his daughter , [name]. Pt presents with LLE compression wraps in place.LE-related pain is unchanged since initial evaluation yesterday.    Patient is accompanied by: Family member   daughter, [name]   Pertinent History chronic BLE lymphedema (LE) and associated pain/ discomfort. Altered sensation/numbness LLE, Hx lymphorrhea, hx non-pressure related leg ulcer, stasis dermatitis, AORTIC ANEURISM, BILATERAL CAROTID STENOSIS, BLADDER CANCER 03/05/13, CAD, CHF. htn, s/p squamous cell carcinoma 2020 x 2, 20117  Limitations decreased endurance, decreased balance, chronic leg swelling w pain, hx multiple venous stasis leg ulcers,  ulcer, hx recurrent cellulitis, impaired posture,    Repetition Increases Symptoms    Special Tests +[place]emmer bilaterally, FOTO TBA Rx visit 1    Patient Stated Goals "get this swelling down in about 2 weeks"    Pain Location Leg    Pain Orientation Right;Left    Pain Descriptors / Indicators Numbness;Tightness;Discomfort;Tiring;Heaviness    Pain Type Chronic pain    Pain Onset Other (comment)   insideous onset ~ 5 yrs  ago   Pain Frequency Intermittent                          OT Treatments/Exercises (OP) - 06/12/22 1300       ADLs   ADL Education Given Yes      Manual Therapy   Manual Therapy Edema management;Manual Lymphatic Drainage (MLD)    Compression Bandaging Bilateral knee length. short stretch compression wraps applied to LLE using one each 8, 10 and 12 cm wide short stretch wraps iover stockinett in gradient configuration over 1 layer of [place] 0.4 cm thick foam. Same configuration to RLE with one less layer- the 12 cm wide wrap.                    OT Education - 06/12/22 1303     Education Details Pt and CG education for application of multilayer, knee length, gradient compression wraps. Explained rational for techniques and importance of   consistent application using correct techniques.    Person(s) Educated Patient;Caregiver(s)    Methods Explanation;Demonstration;Handout    Comprehension Verbalized understanding;Returned demonstration;Need further instruction                 OT Long Term Goals - 06/11/22 1437       OT LONG TERM GOAL #1   Title Given this patient's risk-adjustment variables, and is Intake Functional Status score of /100 on the FOTO tool, patient will experience at least an increase in function of 5 points, or higher.    Baseline TBA    Time 12    Period Weeks    Status New   unable to assess on 03/19/21 for 19th visit progress note due to technology failure. Will assess at 3 month f/u.   Target Date 09/09/22      OT LONG TERM GOAL #2   Title Pt will be able to apply knee length, multi-layer, short stretch compression wraps using correct gradient techniques with max caregiver Assistance  to decrease limb volume and associated  leg pain and limit infection risk,    Baseline dependent    Time 4    Period Days    Status New   Max A from caregiver required   Target Date --   4th OT Rx visit     OT LONG TERM GOAL #3   Title  Patient will demonstrate understanding of LE precautions, prevention strategies and cellulitis signs and symptoms by verbalizing at least 4 examples using  printed reference( modified independence)  to limit infection risk, recurrent wounds and LE progression.    Baseline Max A    Time 4    Period Days    Status New    Target Date --   4th OT rx visit     OT LONG TERM GOAL #4   Title Pt will achieve 5%, or greater,  limb volume reduction on  the R below the knee, and 10% reduction on the L below the knee (ankle to tibial tuberosity = A-D) during Intensive Phase CDT to achieve optimal limb volume reduction and to prevent re-accumulation of lymphatic congestion and fibrosis in the legs, to limit infection risk, to improve functional ambulation and transfers, to improve functional performance of basic and instrumental ADLs, and to limit LE progression.    Baseline Max A    Time 12    Period Weeks    Status New   03/19/21: RLE reduction = 20.96% from ankle to popliteal (A-D). LLE volume increase = 13.98% since 01/02/21.   Target Date 09/09/22      OT LONG TERM GOAL #5   Title With max CG assistance Pt will demonstrate and sustain a least 85% compliance performing all daily LE self-care home program components daily throughout Intensive Phase CDT, including recommended skin care regime, lymphatic pumping ther ex, 23/7 compression wraps and simple self-MLD, to ensure optimal limb volume reduction, to limit infection risk and to limit LE progression.    Baseline dependent    Time 12    Period Weeks    Status New   Pt has had difficulty with compliance with compression wraps to date. He has not been able to apply wraps himself. A very helpful friend/ neighbor learned to apply wraps at last visit. She did apply them this past weekend   Target Date 09/09/22      OT LONG TERM GOAL #6   Title Using assistive devices and additional time  (modified independence)  Pt will be able to don and doff appropriate  daytime compression garments and HOS devices to limit lymphatic re-accumulation and LE progression with before transitioning to self-management phase of CDT.    Baseline Max A    Time 12    Period --   issue date   Status New                   Plan - 06/11/22 1306     Clinical Impression Statement RLE   is decreased significantly in volume after overnight compression wrapappplied at eval yesterday since Pt and OT are so well known to each other. Small linear area of skin breakdown and weeping appears to be healing well. but is not yet fully healed. We applied a non-stick bandage without tape directly to skin again today before appling wraps to keep bandages from sticking to the skin and tearing it off. Emphasis of session on Pt and family edu for correct application of multilayer compression wraps using gradient techniques. Provided verbal and tactile cures, demonstration, and  provided multiple opportunities to practice. By end of session daughter [name] was able to apple knee length wraps to RLE with moderate assistance. Pt needed frequent cues for repositioning. Good session. Cont as per POC.    OT Occupational Profile and History Detailed Assessment- Review of Records and additional review of physical, cognitive, psychosocial history related to current functional performance    Occupational performance deficits (Please refer to evaluation for details): ADL's;IADL's;Work;Leisure;Social Participation    Body Structure / Function / Physical Skills ADL;Decreased knowledge of use of DME;Balance;Pain;Edema;Endurance;IADL;Scar mobility;Mobility;Decreased knowledge of precautions;Skin integrity    Rehab Potential Good    Clinical Decision Making Several treatment options, min-mod task modification necessary    Comorbidities Affecting Occupational Performance: Presence of comorbidities impacting occupational performance    Comorbidities impacting occupational performance description: CVI, CAD, hx  non-pressure related leg ulcer    Modification or  Assistance to Complete Evaluation  Min-Moderate modification of tasks or assist with assess necessary to complete eval    OT Frequency 3x / week    OT Duration 12 weeks    OT Treatment/Interventions Self-care/ADL training;DME and/or AE instruction;Manual lymph drainage;Compression bandaging;Therapeutic activities;Therapeutic exercise;Scar mobilization;Other (comment);Manual Therapy;Patient/family education    Plan Complete Decongestive Therapy (CDT) Pt and CG will be taught to apply multilayer gradient compression wraps below knee to base of toes. CDT also to include manual lymphatic drainage, skin care, therapeutic exercise and fitting with appropriate knee length compression garments that are comfortable and Pt is able to don and doff using assistive devices PRN.    Consulted and Agree with Plan of Care Patient;Family member/caregiver    Family Member Consulted daughter, [name]             Patient will benefit from skilled therapeutic intervention in order to improve the following deficits and impairments:   Body Structure / Function / Physical Skills: ADL, Decreased knowledge of use of DME, Balance, Pain, Edema, Endurance, IADL, Scar mobility, Mobility, Decreased knowledge of precautions, Skin integrity       Visit Diagnosis: Lymphedema, not elsewhere classified    Problem List Patient Active Problem List   Diagnosis Date Noted   Stasis dermatitis 05/24/2022   Acute on chronic combined systolic and diastolic CHF (congestive heart failure) ([place]) 09/28/2021   Benign prostatic hyperplasia 09/28/2021   Rectal bleeding 02/24/2021   Rectal irritation 11/25/2020   Lymphedema 11/19/2020   Pressure injury of right buttock, stage 2 ([place]) 04/23/2020   Lower limb ulcer, calf, left, limited to breakdown of skin ([place]) 12/27/2019   Dysuria 06/30/2019   Hyperglycemia 06/30/2019   LVH (left ventricular hypertrophy) due to hypertensive disease,  without heart failure 05/26/2019   Pseudophakia, left eye 11/26/2018   Venous insufficiency of both lower extremities 10/13/2018   Swelling of limb 10/12/2018   Bilateral lower extremity edema 10/10/2018   Absent pulse in lower extremity 10/10/2018   Corneal thinning of right eye 07/17/2018   Eye pain 05/31/2018   Mild aortic valve stenosis 12/03/2017   Epididymoorchitis 09/02/2017   Hydrocele, right 08/11/2017   Aortic aneurysm ([place]) 03/28/2017   Abnormal chest CT 03/28/2017   Acute respiratory failure with hypoxia ([place]) 12/27/2016   Hypotension    Dizziness 12/22/2016   Leg weakness 03/11/2016   Skin lesion 09/11/2015   Moderate mitral insufficiency 05/18/2015   Moderate tricuspid insufficiency 05/18/2015   Essential hypertension 04/25/2015   Rash 03/10/2015   Health care maintenance 03/10/2015   Bilateral carotid artery stenosis 08/30/2014   Atherosclerosis of both carotid arteries 59/56/3875   Chronic systolic CHF (congestive heart failure), NYHA class 3 ([place]) 07/17/2014   Coronary artery disease involving native coronary artery of native heart without angina pectoris 06/18/2014   CHF (congestive heart failure) ([place]) 04/20/2014   Congestive heart failure ([place]) 04/20/2014   SOB (shortness of breath) 03/10/2014   Abnormal EKG 03/10/2014   Mixed hyperlipidemia 03/10/2014   Benign lipomatous neoplasm of skin and subcutaneous tissue of left arm 10/04/2013   Arm skin lesion, left 09/04/2013   History of shingles 09/04/2013   History of bladder cancer 03/05/2013   Chronic prostatitis 09/30/2012   Elevated prostate specific antigen (PSA) 09/30/2012   Family history of malignant neoplasm of prostate 09/30/2012   Gross hematuria 09/30/2012   Incomplete emptying of bladder 09/30/2012   Anemia 08/30/2012   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 MAIN [facility] SERVICES [address] [place], [place], [address] Phone: [phone]   Fax:  [phone]  Name: KALUM MINNER MRN: [id number] Date of Birth: [birth year]

## 2022-06-12 NOTE — Patient Instructions (Signed)

## 2022-06-13 DIAGNOSIS — G7000 Myasthenia gravis without (acute) exacerbation: Secondary | ICD-10-CM | POA: Diagnosis not present

## 2022-06-13 DIAGNOSIS — Z79899 Other long term (current) drug therapy: Secondary | ICD-10-CM | POA: Diagnosis not present

## 2022-06-13 NOTE — Patient Instructions (Signed)

## 2022-06-13 NOTE — Therapy (Signed)
[facility] [address] [place], [place], [address] Phone: [phone]   Fax:  [phone]  Occupational Therapy Treatment  Patient Details  Name: [name] MRN: [id number] Date of Birth: Feb 27, 1926 Referring Provider (OT): [name], MD   Encounter Date: 06/12/2022   OT End of Session - 06/12/22 1047     Visit Number 3    Number of Visits 36    Date for OT Re-Evaluation 09/09/22    OT Start Time 1105    OT Stop Time 3664    OT Time Calculation (min) 59 min    Activity Tolerance Patient tolerated treatment well;No increased pain    Behavior During Therapy [facility] for tasks assessed/performed             Past Medical History:  Diagnosis Date   Abnormal chest CT 03/28/2017   Allergy    Anemia    Angina pectoris ([place]) 04/02/2011   Aortic aneurysm ([place]) 03/28/2017   Saw Dr Genevive Bi 04/16/17 - recommended f/u CT in 3 months.     Atherosclerosis of both carotid arteries 07/17/2014   Basal cell carcinoma 06/25/2021   Left ant neck. Nodulocystic pattern, close to margin   Benign lipomatous neoplasm of skin and subcutaneous tissue of left arm 10/04/2013   Bilateral carotid artery stenosis 08/30/2014   Overview:  Less than 50% 2015   Bladder cancer ([place]) 03/05/2013   CAD (coronary artery disease) 06/18/2014   Cancer ([place]) 11/24/2012   bladder. BCG treatments by Dr Jacqlyn Larsen.   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 WD SCC. Re-shaved 01/14/2016. Excised 02/05/2016, margins free.   Squamous cell carcinoma of skin 11/03/2018   Right cheek ant. to sideburn. Poorly differentiated.   Squamous cell carcinoma of skin 02/02/2019   Right mid dorsum forearm. WD SCC. EDC.    Past Surgical History:  Procedure Laterality Date   [place]    There were no vitals filed for this visit.   Subjective Assessment - 06/13/22 1050     Subjective  [name] presents for OT visit to address BLE lymphedema. He is accompanied by a local neighbor and 3 family members who plan to assist him with compression bandaging, in addition to his daughter, between clinic visits.Everyone is eager to learn to wrap. Pt denies LE related leg pain. He wrears wraps applied at clinic visit yesterday todoy stating he had no difficulty tolerating wraps overnight.    Patient is accompanied by: Family member    Pertinent History chronic BLE lymphedema (LE) and associated pain/ discomfort. Altered sensation/numbness LLE, Hx lymphorrhea, hx non-pressure related  leg ulcer, stasis dermatitis, AORTIC ANEURISM, BILATERAL CAROTID STENOSIS, BLADDER CANCER 03/05/13, CAD, CHF. htn, s/p squamous cell carcinoma 2020 x 2, 20117    Limitations decreased endurance, impaired balance, chronic leg swelling w associated pain, hx skin ulcer, limited knowledge of DME,decreased hip and knee AROM- difficulty reaching feet;    Repetition Increases Symptoms    Special Tests +[place] bilaterally, FOTO TBA Rx visit 1    Currently in Pain? No/denies    Pain Onset Other (comment)                          OT  Treatments/Exercises (OP) - 06/13/22 1053       ADLs   ADL Education Given Yes      Manual Therapy   Manual Therapy Edema management;Manual Lymphatic Drainage (MLD)    Compression Bandaging Bilateral knee length. short stretch compression wraps applied to LLE using one each 8, 10 and 12 cm wide short stretch wraps iover stockinett in gradient configuration over 1 layer of [place] 0.4 cm thick foam. Same configuration to RLE with one less layer- the 12 cm wide wrap.                    OT Education - 06/13/22 1050     Education Details Pt and CG education for application of multilayer, knee length, gradient compression wraps. Explained rational for techniques and importance of   consistent application using correct techniques.    Person(s) Educated Patient;Caregiver(s)    Methods Explanation;Demonstration;Handout    Comprehension Verbalized understanding;Returned demonstration;Need further instruction                 OT Long Term Goals - 06/11/22 1437       OT LONG TERM GOAL #1   Title Given this patient's risk-adjustment variables, and is Intake Functional Status score of /100 on the FOTO tool, patient will experience at least an increase in function of 5 points, or higher.    Baseline TBA    Time 12    Period Weeks    Status New   unable to assess on 03/19/21 for 19th visit progress note due to technology failure. Will assess at 3 month f/u.   Target Date 09/09/22      OT LONG TERM GOAL #2   Title Pt will be able to apply knee length, multi-layer, short stretch compression wraps using correct gradient techniques with max caregiver Assistance  to decrease limb volume and associated  leg pain and limit infection risk,    Baseline dependent    Time 4    Period Days    Status New   Max A from caregiver required   Target Date --   4th OT Rx visit     OT LONG TERM GOAL #3   Title Patient will demonstrate understanding of LE precautions, prevention strategies and  cellulitis signs and symptoms by verbalizing at least 4 examples using  printed reference( modified independence)  to limit infection risk, recurrent wounds and LE progression.    Baseline Max A    Time 4    Period Days    Status New    Target Date --   4th OT rx visit     OT LONG TERM GOAL #4   Title Pt will achieve 5%, or greater,  limb volume reduction on the R below the knee, and 10% reduction on the L below the knee (ankle to tibial tuberosity =  A-D) during Intensive Phase CDT to achieve optimal limb volume reduction and to prevent re-accumulation of lymphatic congestion and fibrosis in the legs, to limit infection risk, to improve functional ambulation and transfers, to improve functional performance of basic and instrumental ADLs, and to limit LE progression.    Baseline Max A    Time 12    Period Weeks    Status New   03/19/21: RLE reduction = 20.96% from ankle to popliteal (A-D). LLE volume increase = 13.98% since 01/02/21.   Target Date 09/09/22      OT LONG TERM GOAL #5   Title With max CG assistance Pt will demonstrate and sustain a least 85% compliance performing all daily LE self-care home program components daily throughout Intensive Phase CDT, including recommended skin care regime, lymphatic pumping ther ex, 23/7 compression wraps and simple self-MLD, to ensure optimal limb volume reduction, to limit infection risk and to limit LE progression.    Baseline dependent    Time 12    Period Weeks    Status New   Pt has had difficulty with compliance with compression wraps to date. He has not been able to apply wraps himself. A very helpful friend/ neighbor learned to apply wraps at last visit. She did apply them this past weekend   Target Date 09/09/22      OT LONG TERM GOAL #6   Title Using assistive devices and additional time  (modified independence)  Pt will be able to don and doff appropriate daytime compression garments and HOS devices to limit lymphatic re-accumulation and LE  progression with before transitioning to self-management phase of CDT.    Baseline Max A    Time 12    Period --   issue date   Status New                   Plan - 06/13/22 1053     Clinical Impression Statement RLE   is decreased significantly in volume again today after last 2 days in  multilayer, short stretch compression wraps. Skin condition continues to improve. Abraided area on lateral R leg is all but closeed/, but we won't be able to use a garment yet without risking pulling fragile skin off, increased risk of reoccurence of cellulitis. RLE was gently washed with warm soap and water and dried thouroughly before commencing 2nd teaching session for compression wrap application. Reapplied antibacterial first aid cream and redressed with non-stick first aid pad without tape. Anchored pad   with clean stockinett. Emphasis of session again today on Pt and family edu for correct application of multilayer compression wraps using gradient techniques. Provided verbal and tactile cures, demonstration, and  provided multiple opportunities to practice. By end of session one helper was able to apply wraps with min assist, including verbal cues. The other attendees opted out of practice. Cont as per POC. Fit RLE compression garment ASAP and shift focus of Rx to LLE.    OT Occupational Profile and History Detailed Assessment- Review of Records and additional review of physical, cognitive, psychosocial history related to current functional performance    Occupational performance deficits (Please refer to evaluation for details): ADL's;IADL's;Work;Leisure;Social Participation    Body Structure / Function / Physical Skills ADL;Decreased knowledge of use of DME;Balance;Pain;Edema;Endurance;IADL;Scar mobility;Mobility;Decreased knowledge of precautions;Skin integrity    Rehab Potential Good    Clinical Decision Making Several treatment options, min-mod task modification necessary    Comorbidities  Affecting Occupational Performance: Presence of comorbidities impacting occupational  performance    Comorbidities impacting occupational performance description: CVI, CAD, hx non-pressure related leg ulcer    Modification or Assistance to Complete Evaluation  Min-Moderate modification of tasks or assist with assess necessary to complete eval    OT Frequency 3x / week    OT Duration 12 weeks    OT Treatment/Interventions Self-care/ADL training;DME and/or AE instruction;Manual lymph drainage;Compression bandaging;Therapeutic activities;Therapeutic exercise;Scar mobilization;Other (comment);Manual Therapy;Patient/family education    Plan Complete Decongestive Therapy (CDT) Pt and CG will be taught to apply multilayer gradient compression wraps below knee to base of toes. CDT also to include manual lymphatic drainage, skin care, therapeutic exercise and fitting with appropriate knee length compression garments that are comfortable and Pt is able to don and doff using assistive devices PRN.    Consulted and Agree with Plan of Care Patient;Family member/caregiver    Family Member Consulted daughter, [name]             Patient will benefit from skilled therapeutic intervention in order to improve the following deficits and impairments:   Body Structure / Function / Physical Skills: ADL, Decreased knowledge of use of DME, Balance, Pain, Edema, Endurance, IADL, Scar mobility, Mobility, Decreased knowledge of precautions, Skin integrity       Visit Diagnosis: Lymphedema, not elsewhere classified    Problem List Patient Active Problem List   Diagnosis Date Noted   Stasis dermatitis 05/24/2022   Acute on chronic combined systolic and diastolic CHF (congestive heart failure) ([place]) 09/28/2021   Benign prostatic hyperplasia 09/28/2021   Rectal bleeding 02/24/2021   Rectal irritation 11/25/2020   Lymphedema 11/19/2020   Pressure injury of right buttock, stage 2 ([place]) 04/23/2020   Lower limb ulcer,  calf, left, limited to breakdown of skin ([place]) 12/27/2019   Dysuria 06/30/2019   Hyperglycemia 06/30/2019   LVH (left ventricular hypertrophy) due to hypertensive disease, without heart failure 05/26/2019   Pseudophakia, left eye 11/26/2018   Venous insufficiency of both lower extremities 10/13/2018   Swelling of limb 10/12/2018   Bilateral lower extremity edema 10/10/2018   Absent pulse in lower extremity 10/10/2018   Corneal thinning of right eye 07/17/2018   Eye pain 05/31/2018   Mild aortic valve stenosis 12/03/2017   Epididymoorchitis 09/02/2017   Hydrocele, right 08/11/2017   Aortic aneurysm ([place]) 03/28/2017   Abnormal chest CT 03/28/2017   Acute respiratory failure with hypoxia ([place]) 12/27/2016   Hypotension    Dizziness 12/22/2016   Leg weakness 03/11/2016   Skin lesion 09/11/2015   Moderate mitral insufficiency 05/18/2015   Moderate tricuspid insufficiency 05/18/2015   Essential hypertension 04/25/2015   Rash 03/10/2015   Health care maintenance 03/10/2015   Bilateral carotid artery stenosis 08/30/2014   Atherosclerosis of both carotid arteries 22/10/5425   Chronic systolic CHF (congestive heart failure), NYHA class 3 ([place]) 07/17/2014   Coronary artery disease involving native coronary artery of native heart without angina pectoris 06/18/2014   CHF (congestive heart failure) ([place]) 04/20/2014   Congestive heart failure ([place]) 04/20/2014   SOB (shortness of breath) 03/10/2014   Abnormal EKG 03/10/2014   Mixed hyperlipidemia 03/10/2014   Benign lipomatous neoplasm of skin and subcutaneous tissue of left arm 10/04/2013   Arm skin lesion, left 09/04/2013   History of shingles 09/04/2013   History of bladder cancer 03/05/2013   Chronic prostatitis 09/30/2012   Elevated prostate specific antigen (PSA) 09/30/2012   Family history of malignant neoplasm of prostate 09/30/2012   Gross hematuria 09/30/2012   Incomplete emptying of bladder 09/30/2012  Anemia 08/30/2012    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

## 2022-06-14 ENCOUNTER — Other Ambulatory Visit: Payer: Self-pay

## 2022-06-14 ENCOUNTER — Emergency Department: Payer: Medicare Other

## 2022-06-14 ENCOUNTER — Inpatient Hospital Stay
Admission: EM | Admit: 2022-06-14 | Discharge: 2022-06-18 | DRG: 280 | Disposition: A | Discharge status: Home / Self Care | Payer: Medicare Other | Attending: Osteopathic Medicine | Admitting: Osteopathic Medicine

## 2022-06-14 DIAGNOSIS — J9621 Acute and chronic respiratory failure with hypoxia: Secondary | ICD-10-CM | POA: Diagnosis present

## 2022-06-14 DIAGNOSIS — Z85828 Personal history of other malignant neoplasm of skin: Secondary | ICD-10-CM | POA: Diagnosis not present

## 2022-06-14 DIAGNOSIS — I083 Combined rheumatic disorders of mitral, aortic and tricuspid valves: Secondary | ICD-10-CM | POA: Diagnosis present

## 2022-06-14 DIAGNOSIS — Z7982 Long term (current) use of aspirin: Secondary | ICD-10-CM

## 2022-06-14 DIAGNOSIS — E876 Hypokalemia: Secondary | ICD-10-CM | POA: Diagnosis present

## 2022-06-14 DIAGNOSIS — Z8551 Personal history of malignant neoplasm of bladder: Secondary | ICD-10-CM

## 2022-06-14 DIAGNOSIS — I5043 Acute on chronic combined systolic (congestive) and diastolic (congestive) heart failure: Secondary | ICD-10-CM | POA: Diagnosis present

## 2022-06-14 DIAGNOSIS — E7800 Pure hypercholesterolemia, unspecified: Secondary | ICD-10-CM | POA: Diagnosis present

## 2022-06-14 DIAGNOSIS — Z23 Encounter for immunization: Secondary | ICD-10-CM

## 2022-06-14 DIAGNOSIS — I509 Heart failure, unspecified: Secondary | ICD-10-CM | POA: Diagnosis not present

## 2022-06-14 DIAGNOSIS — I214 Non-ST elevation (NSTEMI) myocardial infarction: Secondary | ICD-10-CM | POA: Diagnosis present

## 2022-06-14 DIAGNOSIS — J9601 Acute respiratory failure with hypoxia: Principal | ICD-10-CM

## 2022-06-14 DIAGNOSIS — J9622 Acute and chronic respiratory failure with hypercapnia: Secondary | ICD-10-CM | POA: Diagnosis present

## 2022-06-14 DIAGNOSIS — Z79624 Long term (current) use of inhibitors of nucleotide synthesis: Secondary | ICD-10-CM

## 2022-06-14 DIAGNOSIS — Z87891 Personal history of nicotine dependence: Secondary | ICD-10-CM

## 2022-06-14 DIAGNOSIS — Z515 Encounter for palliative care: Secondary | ICD-10-CM | POA: Diagnosis not present

## 2022-06-14 DIAGNOSIS — Z881 Allergy status to other antibiotic agents status: Secondary | ICD-10-CM

## 2022-06-14 DIAGNOSIS — I872 Venous insufficiency (chronic) (peripheral): Secondary | ICD-10-CM | POA: Diagnosis present

## 2022-06-14 DIAGNOSIS — I2510 Atherosclerotic heart disease of native coronary artery without angina pectoris: Secondary | ICD-10-CM | POA: Diagnosis present

## 2022-06-14 DIAGNOSIS — I10 Essential (primary) hypertension: Secondary | ICD-10-CM | POA: Diagnosis present

## 2022-06-14 DIAGNOSIS — N400 Enlarged prostate without lower urinary tract symptoms: Secondary | ICD-10-CM | POA: Diagnosis present

## 2022-06-14 DIAGNOSIS — Z789 Other specified health status: Secondary | ICD-10-CM | POA: Diagnosis not present

## 2022-06-14 DIAGNOSIS — Z888 Allergy status to other drugs, medicaments and biological substances status: Secondary | ICD-10-CM

## 2022-06-14 DIAGNOSIS — I110 Hypertensive heart disease with heart failure: Secondary | ICD-10-CM | POA: Diagnosis present

## 2022-06-14 DIAGNOSIS — G7000 Myasthenia gravis without (acute) exacerbation: Secondary | ICD-10-CM | POA: Diagnosis present

## 2022-06-14 DIAGNOSIS — Z20822 Contact with and (suspected) exposure to covid-19: Secondary | ICD-10-CM | POA: Diagnosis present

## 2022-06-14 DIAGNOSIS — Z79899 Other long term (current) drug therapy: Secondary | ICD-10-CM

## 2022-06-14 DIAGNOSIS — Z882 Allergy status to sulfonamides status: Secondary | ICD-10-CM

## 2022-06-14 DIAGNOSIS — G7080 Lambert-Eaton syndrome, unspecified: Secondary | ICD-10-CM | POA: Diagnosis present

## 2022-06-14 LAB — BASIC METABOLIC PANEL
Anion gap: 11 (ref 5–15)
BUN: 19 mg/dL (ref 8–23)
CO2: 30 mmol/L (ref 22–32)
Calcium: 9.2 mg/dL (ref 8.9–10.3)
Chloride: 100 mmol/L (ref 98–111)
Creatinine, Ser: 1.06 mg/dL (ref 0.61–1.24)
GFR, Estimated: >60 mL/min (ref >60)
Glucose, Bld: 132 mg/dL — ABNORMAL HIGH (ref 70–99)
Potassium: 3.9 mmol/L (ref 3.5–5.1)
Sodium: 141 mmol/L (ref 135–145)

## 2022-06-14 LAB — BLOOD GAS, ARTERIAL
Acid-Base Excess: 10.2 mmol/L — ABNORMAL HIGH (ref 0.0–2.0)
Bicarbonate: 36.1 mmol/L — ABNORMAL HIGH (ref 20.0–28.0)
Expiratory PAP: 10 cmH2O
FIO2: 40 %
Inspiratory PAP: 20 cmH2O
O2 Saturation: 99.7 %
Patient temperature: 37
pCO2 arterial: 52 mmHg — ABNORMAL HIGH (ref 32–48)
pH, Arterial: 7.45 (ref 7.35–7.45)
pO2, Arterial: 125 mmHg — ABNORMAL HIGH (ref 83–108)

## 2022-06-14 LAB — BLOOD GAS, VENOUS
Acid-Base Excess: 3.4 mmol/L — ABNORMAL HIGH (ref 0.0–2.0)
Acid-Base Excess: 9.1 mmol/L — ABNORMAL HIGH (ref 0.0–2.0)
Bicarbonate: 34.4 mmol/L — ABNORMAL HIGH (ref 20.0–28.0)
Bicarbonate: 39.9 mmol/L — ABNORMAL HIGH (ref 20.0–28.0)
O2 Saturation: 96.1 %
O2 Saturation: 51.4 %
Patient temperature: 37
Patient temperature: 37
pCO2, Ven: 88 mmHg (ref 44–60)
pCO2, Ven: 89 mmHg (ref 44–60)
pH, Ven: 7.2 — ABNORMAL LOW (ref 7.25–7.43)
pH, Ven: 7.26 (ref 7.25–7.43)
pO2, Ven: 83 mmHg — ABNORMAL HIGH (ref 32–45)
pO2, Ven: 36 mmHg (ref 32–45)

## 2022-06-14 LAB — CBC
HCT: 42.2 % (ref 39.0–52.0)
Hemoglobin: 12.9 g/dL — ABNORMAL LOW (ref 13.0–17.0)
MCH: 28.4 pg (ref 26.0–34.0)
MCHC: 30.6 g/dL (ref 30.0–36.0)
MCV: 93 fL (ref 80.0–100.0)
Platelets: 165 10*3/uL (ref 150–400)
RBC: 4.54 MIL/uL (ref 4.22–5.81)
RDW: 15.1 % (ref 11.5–15.5)
WBC: 9.2 10*3/uL (ref 4.0–10.5)
nRBC: 0 % (ref 0.0–0.2)

## 2022-06-14 LAB — HEPATIC FUNCTION PANEL
ALT: 19 U/L (ref 0–44)
AST: 28 U/L (ref 15–41)
Albumin: 4.4 g/dL (ref 3.5–5.0)
Alkaline Phosphatase: 87 U/L (ref 38–126)
Bilirubin, Direct: 0.3 mg/dL — ABNORMAL HIGH (ref 0.0–0.2)
Indirect Bilirubin: 1.3 mg/dL — ABNORMAL HIGH (ref 0.3–0.9)
Total Bilirubin: 1.6 mg/dL — ABNORMAL HIGH (ref 0.3–1.2)
Total Protein: 7 g/dL (ref 6.5–8.1)

## 2022-06-14 LAB — PROTIME-INR
INR: 1.2 (ref 0.8–1.2)
Prothrombin Time: 14.7 seconds (ref 11.4–15.2)

## 2022-06-14 LAB — PROCALCITONIN: Procalcitonin: <0.1 ng/mL

## 2022-06-14 LAB — APTT: aPTT: 30 seconds (ref 24–36)

## 2022-06-14 LAB — MAGNESIUM: Magnesium: 2.1 mg/dL (ref 1.7–2.4)

## 2022-06-14 LAB — LACTIC ACID, PLASMA
Lactic Acid, Venous: 1.9 mmol/L (ref 0.5–1.9)
Lactic Acid, Venous: 1.5 mmol/L (ref 0.5–1.9)

## 2022-06-14 LAB — TROPONIN I (HIGH SENSITIVITY)
Troponin I (High Sensitivity): 25 ng/L — ABNORMAL HIGH (ref <18)
Troponin I (High Sensitivity): 164 ng/L (ref <18)
Troponin I (High Sensitivity): 594 ng/L (ref <18)
Troponin I (High Sensitivity): 1709 ng/L (ref <18)

## 2022-06-14 LAB — RESP PANEL BY RT-PCR (FLU A&B, COVID) ARPGX2
Influenza A by PCR: NEGATIVE
Influenza B by PCR: NEGATIVE
SARS Coronavirus 2 by RT PCR: NEGATIVE

## 2022-06-14 LAB — GLUCOSE, CAPILLARY: Glucose-Capillary: 182 mg/dL — ABNORMAL HIGH (ref 70–99)

## 2022-06-14 LAB — TYPE AND SCREEN
ABO/RH(D): O POS
Antibody Screen: NEGATIVE

## 2022-06-14 LAB — BRAIN NATRIURETIC PEPTIDE: B Natriuretic Peptide: 755.9 pg/mL — ABNORMAL HIGH (ref 0.0–100.0)

## 2022-06-14 LAB — MRSA NEXT GEN BY PCR, NASAL: MRSA by PCR Next Gen: NOT DETECTED

## 2022-06-14 LAB — PHOSPHORUS: Phosphorus: 2.8 mg/dL (ref 2.5–4.6)

## 2022-06-14 MED ORDER — HEPARIN BOLUS VIA INFUSION
4000.0000 [IU] | Freq: Once | INTRAVENOUS | Status: AC
  Administered 2022-06-14: 4000 [IU] via INTRAVENOUS
  Filled 2022-06-14: qty 4000

## 2022-06-14 MED ORDER — FUROSEMIDE 10 MG/ML IJ SOLN
40.0000 mg | Freq: Two times a day (BID) | INTRAMUSCULAR | Status: DC
  Administered 2022-06-14 – 2022-06-16 (×4): 40 mg via INTRAVENOUS
  Filled 2022-06-14 (×4): qty 4

## 2022-06-14 MED ORDER — ENOXAPARIN SODIUM 40 MG/0.4ML IJ SOSY
40.0000 mg | PREFILLED_SYRINGE | INTRAMUSCULAR | Status: DC
  Filled 2022-06-14: qty 0.4

## 2022-06-14 MED ORDER — SODIUM CHLORIDE 0.9 % IV SOLN: 250.0000 mL | INTRAVENOUS | Status: DC

## 2022-06-14 MED ORDER — POLYETHYLENE GLYCOL 3350 17 G PO PACK: 17.0000 g | PACK | Freq: Every day | ORAL | Status: DC | PRN

## 2022-06-14 MED ORDER — PYRIDOSTIGMINE BROMIDE 10 MG/2ML IV SOLN
1.0000 mg | Freq: Once | INTRAVENOUS | Status: AC
  Administered 2022-06-14: 1 mg via INTRAVENOUS
  Filled 2022-06-14: qty 0.2

## 2022-06-14 MED ORDER — IPRATROPIUM-ALBUTEROL 0.5-2.5 (3) MG/3ML IN SOLN
3.0000 mL | Freq: Once | RESPIRATORY_TRACT | Status: AC
  Administered 2022-06-14: 3 mL via RESPIRATORY_TRACT
  Filled 2022-06-14: qty 3

## 2022-06-14 MED ORDER — CHLORHEXIDINE GLUCONATE CLOTH 2 % EX PADS
6.0000 | MEDICATED_PAD | Freq: Every day | CUTANEOUS | Status: DC
  Administered 2022-06-14 – 2022-06-17 (×4): 6 via TOPICAL

## 2022-06-14 MED ORDER — DOCUSATE SODIUM 100 MG PO CAPS: 100.0000 mg | ORAL_CAPSULE | Freq: Two times a day (BID) | ORAL | Status: DC | PRN

## 2022-06-14 MED ORDER — HEPARIN (PORCINE) 25000 UT/250ML-% IV SOLN
1450.0000 [IU]/h | INTRAVENOUS | Status: AC
  Administered 2022-06-14: 1150 [IU]/h via INTRAVENOUS
  Administered 2022-06-15 – 2022-06-16 (×2): 1450 [IU]/h via INTRAVENOUS
  Filled 2022-06-14 (×3): qty 250

## 2022-06-14 MED ORDER — INFLUENZA VAC A&B SA ADJ QUAD 0.5 ML IM PRSY
0.5000 mL | PREFILLED_SYRINGE | INTRAMUSCULAR | Status: DC
  Filled 2022-06-14 (×2): qty 0.5

## 2022-06-14 MED ORDER — IPRATROPIUM-ALBUTEROL 0.5-2.5 (3) MG/3ML IN SOLN: 3.0000 mL | Freq: Four times a day (QID) | RESPIRATORY_TRACT | Status: DC | PRN

## 2022-06-14 MED ORDER — PYRIDOSTIGMINE BROMIDE 10 MG/2ML IV SOLN
1.0000 mg | Freq: Four times a day (QID) | INTRAVENOUS | Status: DC
  Administered 2022-06-14 – 2022-06-15 (×2): 1 mg via INTRAVENOUS
  Filled 2022-06-14 (×3): qty 0.2

## 2022-06-14 MED ORDER — NOREPINEPHRINE 4 MG/250ML-% IV SOLN: 2.0000 ug/min | INTRAVENOUS | Status: DC

## 2022-06-14 MED ORDER — FUROSEMIDE 10 MG/ML IJ SOLN
40.0000 mg | Freq: Once | INTRAMUSCULAR | Status: AC
  Administered 2022-06-14: 40 mg via INTRAVENOUS
  Filled 2022-06-14: qty 4

## 2022-06-14 NOTE — Progress Notes (Signed)
An USGPIV (ultrasound guided PIV) has been placed for short-term vasopressor infusion. A correctly placed ivWatch must be used when administering Vasopressors. Should this treatment be needed beyond 72 hours, central line access should be obtained.  It will be the responsibility of the bedside nurse to follow best practice to prevent extravasations.   ?

## 2022-06-14 NOTE — Progress Notes (Signed)
Not able to perform NIF

## 2022-06-14 NOTE — Progress Notes (Signed)
ANTICOAGULATION CONSULT NOTE - Initial Consult  Pharmacy Consult for Heparin  Indication: chest pain/ACS  Allergies  Allergen Reactions   Beta Adrenergic Blockers Other (See Comments)    Beta Blocker will precipitate acute weakness in Lambert-Eaton Syndrome or Myasthenia Gravis   Calcium Channel Blockers Other (See Comments)    Calcium Channel Blocker- will precipitate acute weakness in Lambert-Eaton Syndrome.   Ciprofloxacin Other (See Comments)    Last resort due to Myasthia Gravis   Sulfa Antibiotics Rash    Patient Measurements: Height: '5\' 10"'$  (177.8 cm) Weight: 84.6 kg (186 lb 8.2 oz) IBW/kg (Calculated) : 73 Heparin Dosing Weight: 84.6 kg   Vital Signs: Temp: 97.6 F (36.4 C) (09/16 1900) Temp Source: Axillary (09/16 1900) BP: 100/62 (09/16 2000) Pulse Rate: 75 (09/16 2000)  Labs: Recent Labs    06/14/22 1526 06/14/22 1655 06/14/22 1859 06/14/22 2055  HGB 12.9*  --   --   --   HCT 42.2  --   --   --   PLT 165  --   --   --   APTT 30  --   --   --   LABPROT 14.7  --   --   --   INR 1.2  --   --   --   CREATININE 1.06  --   --   --   TROPONINIHS 25* 164* 594* 1,709*    Estimated Creatinine Clearance: 45 mL/min (by C-G formula based on SCr of 1.06 mg/dL).   Medical History: Past Medical History:  Diagnosis Date   Abnormal chest CT 03/28/2017   Allergy    Anemia    Angina pectoris ([place]) 04/02/2011   Aortic aneurysm ([place]) 03/28/2017   Saw Dr Genevive Bi 04/16/17 - recommended f/u CT in 3 months.     Atherosclerosis of both carotid arteries 07/17/2014   Basal cell carcinoma 06/25/2021   Left ant neck. Nodulocystic pattern, close to margin   Benign lipomatous neoplasm of skin and subcutaneous tissue of left arm 10/04/2013   Bilateral carotid artery stenosis 08/30/2014   Overview:  Less than 50% 2015   Bladder cancer ([place]) 03/05/2013   CAD (coronary artery disease) 06/18/2014   Cancer ([place]) 11/24/2012   bladder. BCG treatments by Dr Jacqlyn Larsen.   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 WD SCC. Re-shaved 01/14/2016. Excised 02/05/2016, margins free.   Squamous cell carcinoma of skin 11/03/2018   Right cheek ant. to sideburn. Poorly differentiated.   Squamous cell carcinoma of skin 02/02/2019   Right mid dorsum forearm. WD SCC. EDC.    Medications:  Medications Prior to Admission  Medication Sig Dispense Refill Last Dose   aspirin 81 MG tablet Take 81 mg by mouth daily.  06/13/2022   finasteride (PROSCAR) 5 MG tablet Take 5 mg by mouth daily.   06/13/2022   furosemide (LASIX) 40 MG tablet Take 1 tablet (40 mg total) by mouth daily. (Patient taking differently: Take 40 mg by mouth daily.) 90 tablet 3 06/13/2022   mycophenolate (CELLCEPT) 250 MG capsule Take 1,000 mg by mouth 2 (two) times daily.    06/13/2022   pravastatin (PRAVACHOL) 20 MG tablet Take 1 tablet (20 mg total) by mouth daily. 90 tablet 1 06/13/2022   pyridostigmine (MESTINON) 60 MG tablet Take 30 mg by mouth 6 (six) times daily.    06/13/2022   tamsulosin (FLOMAX) 0.4 MG CAPS Take 0.4 mg by mouth daily.   06/13/2022   Acetaminophen (ARTHRITIS PAIN PO) Take 650 mg by mouth.      hydroxypropyl methylcellulose / hypromellose (ISOPTO TEARS /  GONIOVISC) 2.5 % ophthalmic solution Place 1 drop into both eyes daily.      prednisoLONE acetate (PRED FORTE) 1 % ophthalmic suspension Place 1 drop into the right eye daily. Per patient. 5 mL 0    tacrolimus (PROTOPIC) 0.1 % ointment Apply topically 2 (two) times daily. 100 g 2     Assessment: Pharmacy consulted to dose heparin in this 88 year old male admitted with respiratory failure, now with NSTEMI.  No prior anticoag noted. CrCl = 45 ml/min   Goal of Therapy:  Heparin level 0.3-0.7 units/ml Monitor platelets by anticoagulation protocol: Yes   Plan:  Give 4000 units bolus x 1 Start heparin infusion at 1150 units/hr Check anti-Xa level in 8 hours and daily while on heparin Continue to monitor H&H and platelets  Gavinn Collard D 06/14/2022,10:16 PM

## 2022-06-14 NOTE — Progress Notes (Signed)
Pt unable to do NIF due to SOB. Pt placed on 8L high flow due to desaturations.

## 2022-06-14 NOTE — ED Triage Notes (Addendum)
Pt comes with c/o sob that started this am. Pt was out doing yardwork. Pt states increased SOb.  O2 was 77% RA. Pt placed on 2L [place] and O2 increased to 86. Pt taken straight to room at this time. .  Pt has labored breathing noted and accessory muscle use.

## 2022-06-14 NOTE — Progress Notes (Addendum)
Elevated Troponin secondary to N-STEMI vs demand ischemia Troponin: 25 > 164 > 594 > 1709 I, Domingo Pulse Rust-Chester, AGACNP-BC, personally viewed and interpreted this ECG. EKG Interpretation: Date: 06/14/22, EKG Time: 22:06, Rate: 77, Rhythm: NSR, QRS Axis: normal, Intervals: normal, ST/T Wave abnormalities: ST depression leads: II, avF & V4-V6, T wave inversion diffuse leads , Narrative Interpretation: NSR with ST depression and diffuse T wave inversions concerning for ischemia  - Trend troponin to peak, will evaluate d-dimer > consider CTa to evaluate for PE - Echocardiogram ordered - heparin drip per pharmacy consult - continuous cardiac monitoring - Cardiology consulted, appreciate input   Domingo Pulse Rust-[place], AGACNP-BC Acute Care Nurse Practitioner [facility]   [phone] / [phone] Please see Amion for pager details.

## 2022-06-14 NOTE — Progress Notes (Addendum)
NAME:  [name], MRN:  [id number], DOB:  July 17, 1926, LOS: 0 ADMISSION DATE:  06/14/2022  CHIEF COMPLAINT:  Shortness of breath   History of Present Illness:  [name] is a 88 year old male patient with a history most significant for HFrEF and Myasthenia Gravis who presents to the ED with a one day history of shortness of breath.  Patient was in his usual state of health yesterday. He went to [facility] where he was seen by his neurologist. On his way back, he stopped and had a chick fillet sandwich, which is not a usual choice of meal for him. He is usually very careful with his salt intake. He was able to ambulate at baseline yesterday and was last seen in his usual state of healthy by his daughter around 5 pm when she dropped him off. Family also reports that he did a couple of hours of yard work, something that is outside of the ordinary for him.  [name] was interviewed in the presence of his son [name] and daughter-in-law [name]. He denied having chest pain, chest tightness, chest pressure, or wheezing. He confirmed the breathing difficult was sudden in onset. He has not had any increased weakness, difficulty swallowing, increased drooling, or other bulbar symptoms given his history of myasthenia. They report some runny nose recently, but otherwise no signs or symptoms of an URTI. No fevers or chills were reported. Patient has chronic stasis dermatitis for which he is followed as an outpatient with documented improvement.  Patient was seen yesterday at [facility] by neurology for his myasthenia gravis. The note is not complete and I am unable to review it. Review of prior neurology notes shows that he was diagnosed with lambert-eaton syndrome in 1977, and myasthenia gravis in 2001. He is AChR antibody positive and is maintained on 1000 mg bid of mycophenolate and 30 mg of Pyridostigmine every 6 hours. He was also receiving injections of Efgartigimod alfa for the management of his Myasthenia Gravis.  He  also follows with Dr. Dionne Bucy from cardiology for his decompensated heart failure. He has a history of HFrEF, aortic stenosis, tricuspid regurgitation, mitral regurgitation, CAD, and Hypertension. He is on 40 mg of furosemide daily.  In the ED, he was noted to be hypoxic and hypercapnic with a VBG showing a pH of 7.2 and CO2 of 80. He was started on BiPAP 20/10 with improvement in oxygenation. He was given 40 mg of IV furosemide with improvement.  Pertinent  Medical History  #Myasthenia Gravis (AChR +ve) #Lambert Eaton Syndrome (P/Q antibody positive) #HFrEF #CAD #HTN #AS #MR #TR #BPH  Significant Hospital Events: Including procedures, antibiotic start and stop dates in addition to other pertinent events   06/14/2022: started on BiPAP  Interim History / Subjective:  Shortness of breath improved with BiPAP  Objective   Blood pressure 123/70, pulse 98, temperature 98 F (36.7 C), resp. rate (!) 22, SpO2 96 %.       No intake or output data in the 24 hours ending 06/14/22 1648 There were no vitals filed for this visit.  Examination: Physical Exam Constitutional:      General: He is not in acute distress.    Appearance: He is obese. He is ill-appearing. He is not toxic-appearing.  HENT:     Head: Normocephalic.     Nose: Nose normal.     Mouth/Throat:     Mouth: Mucous membranes are moist.     Comments: BiPAP mask in place Cardiovascular:  Rate and Rhythm: Normal rate and regular rhythm.     Heart sounds: Murmur heard.  Pulmonary:     Breath sounds: Rales present. No wheezing.  Abdominal:     General: There is distension.     Palpations: Abdomen is soft.  Musculoskeletal:     Cervical back: Normal range of motion.     Right lower leg: Edema present.     Left lower leg: Edema present.     Comments: Changes of chronic stasis dermatitis  Skin:    General: Skin is warm.  Neurological:     General: No focal deficit present.     Mental Status: He is oriented to  person, place, and time. Mental status is at baseline.     Sensory: No sensory deficit.     Motor: Weakness present.      Resolved Hospital Problem list    Assessment & Plan:   61M with a history of HFrEF and Myasthenia Gravis who presents to the ED with acute onset shortness of breath with imaging and exam findings consistent with acute decompensated heart failure.  Neurology #Myasthenia Gravis #Lambert Beulah Gandy Myasthenic Syndrome  His myasthenia gravis is well controlled on outpatient Pyridostigmine (30 mg q 6 hours) and Efgartigimod alfa outpatient injections. He is also on Mycophenolate 1000 mg bid. He did test positive for P/Q type calcium channel antibodies in 2014 consistent with [name] Myasthenic Syndrome. He also has positive antibodies for AChR antibodies in 2014. He was admitted in December of 2022 where he was managed for a similar chief complaint. At that time, he was also given a course of IVIG for concern of myasthenic crisis (but not steroids). Shortness of breath is sudden onset and not consistent with a myasthenic crisis.  -Continue Pyridostigmine 30 mg every 6 hours (IV) -Restart Mycophenolate once able to take PO intake -Hold Efgartigimod alfa (last infusion was 05/09/2022) -Follow up blood gas  Respiratory #Acute Hypoxic and Hypercapnic Respiratory Failure  Presents with shortness of breath and hypoxia requiring oxygen supplementation in the setting of pulmonary edema from decompensated HFrEF. Presentation not consistent with a myasthenic crisis given acuity. Reports a runny nose but otherwise no cough, sputum production, fever, or other symptoms of URTI. COVID and Flu tests negative. CXR with cardiomegaly, pulmonary edema and no focal infiltrates. His VBG does suggest acute on chronic respiratory acidosis (pH 7.2 and CO2 of 88), and I suspect his hypoxia is secondary to his pulmonary edema as well as worsened by the hypercapnia given its impact on the alveolar gas  equation. His bicarb on BMP is also elevated, again confirming compensation for chronic hypercapnia. Previous blood gases similarly show chronic hypercapnia.  For management, will continue him on BiPAP for support and initiate diuresis. Will monitor his NIF bid given MG. Patient might benefit from long term NIPPV (BiLevel) on discharge given his chronic hypercapnia and neuromuscular disease.  -NIF bid -continue BiPAP -repeat VBG, obtain ABG to assess acid base status  Cardiovascular #Acute Decompensated Heart Failure with reduced Ejection Fraction #Valvulopathy (AS, MR, TR)  Most recent TTE from 09/2021 showed an EF of 30-35% with associated diastolic disfunction and mild MR. AS and TR could not be assessed then, but the aortic valve was noted to be normal in April of 2021. Presenting with pulmonary edema and worsening shortness of breath consistent with decompensated heart failure. Given his myasthenia gravis, he is not on beta blockade. I also don't see any record of him receiving guideline directed medical therapy. Mild  elevation in troponin is secondary to heart failure, will trend and obtain EKG.  Suspect dietary indiscretion is part of reason behind compensation, albeit doesn't explain full picture, and this is likely multi-factorial. Patient could be further optimized with guideline directed medical therapy should this not cause an interaction with his MG. I suspect chronic hypercapnia played a direct role in patient's decompensation and hypoxia.  -Continue Furosemide, increase dose to 40 mg IV bid -Strict I/O's -consider repeat TTE -trend troponin to peak -follow up BNP  Renal  Kidney function appears to be at baseline. Diuresis per cardiovascular section.  -monitor electrolytes closely given active diuresis and MG  GI  NPO while on BiPAP, otherwise no active issues  Endo  No history of diabetes. Monitor glucose per ICU glycemic protocol.  Hem/Onc  Lovenox for  prophylaxis  ID  No signs of active infection. Patient's procalcitonin was <0.1 supporting no active infection. Patient is on Mycophenolate and is at increased risk for opportunistic infections, will monitor fever curve and hold antibiotics. Of note, his total IgG levels monitored by his outpatient providers and are low.  Code Status: Discussion at bedside between ED provider, patient and son yielded consensus at having patient partial code of ok to intubate but not ok to undergo chest compressions. I confirmed this with son, however daughter was not aware of said conversation. I encouraged communication between family members to achieve consensus on code status. I also updated daughter over the phone re Mr. Clabo medical condition and plan. I will revisit this with Mr. Hemstreet and his family.  Armando Reichert, MD  Pulmonary Critical Care  Best Practice (right click and "Reselect all SmartList Selections" daily)   Diet/type: NPO DVT prophylaxis: LMWH GI prophylaxis: N/A Lines: N/A Foley:  Yes, and it is still needed Code Status:  limited  Labs   CBC: Recent Labs  Lab 06/14/22 1526  WBC 9.2  HGB 12.9*  HCT 42.2  MCV 93.0  PLT 557    Basic Metabolic Panel: Recent Labs  Lab 06/14/22 1526  NA 141  K 3.9  CL 100  CO2 30  GLUCOSE 132*  BUN 19  CREATININE 1.06  CALCIUM 9.2   GFR: CrCl cannot be calculated (Unknown ideal weight.). Recent Labs  Lab 06/14/22 1526  WBC 9.2    Liver Function Tests: Recent Labs  Lab 06/14/22 1526  AST 28  ALT 19  ALKPHOS 87  BILITOT 1.6*  PROT 7.0  ALBUMIN 4.4   No results for input(s): "LIPASE", "AMYLASE" in the last 168 hours. No results for input(s): "AMMONIA" in the last 168 hours.  ABG    Component Value Date/Time   PHART 7.37 09/29/2021 1324   PCO2ART 59 (H) 09/29/2021 1324   PO2ART 133 (H) 09/29/2021 1324   HCO3 34.4 (H) 06/14/2022 1606   O2SAT 96.1 06/14/2022 1606     Coagulation Profile: No results for  input(s): "INR", "PROTIME" in the last 168 hours.  Cardiac Enzymes: No results for input(s): "CKTOTAL", "CKMB", "CKMBINDEX", "TROPONINI" in the last 168 hours.  HbA1C: Hgb A1c MFr Bld  Date/Time Value Ref Range Status  02/22/2021 12:34 PM 5.9 4.6 - 6.5 % Final    Comment:    Glycemic Control Guidelines for People with Diabetes:Non Diabetic:  <6%Goal of Therapy: <7%Additional Action Suggested:  >8%   11/15/2020 08:50 AM 5.9 4.6 - 6.5 % Final    Comment:    Glycemic Control Guidelines for People with Diabetes:Non Diabetic:  <6%Goal of Therapy: <7%Additional Action Suggested:  >  8%     CBG: No results for input(s): "GLUCAP" in the last 168 hours.  Review of Systems:   Unable to obtain   Surgical History:   Past Surgical History:  Procedure Laterality Date   [place]     Social History:   reports that he quit smoking about 63 years ago. His smoking use included cigarettes. He has never used smokeless tobacco. He reports current alcohol use. He reports that he does not use drugs.   Family History:  His family history includes Arthritis in an other family member; Lung cancer in his brother and sister; Prostate cancer in his brother. There is no history of Colon cancer.   Allergies Allergies  Allergen Reactions   Beta Adrenergic Blockers Other (See Comments)    Beta Blocker will precipitate acute weakness in Lambert-Eaton Syndrome or Myasthenia Gravis   Calcium Channel Blockers Other (See Comments)    Calcium Channel Blocker- will precipitate acute weakness in Lambert-Eaton Syndrome.   Ciprofloxacin Other (See Comments)    Last resort due to Myasthia Gravis   Sulfa Antibiotics Rash     Home Medications  Prior to Admission medications   Medication Sig Start Date End Date Taking? Authorizing Provider  Acetaminophen (ARTHRITIS PAIN PO) Take 650 mg by mouth.    [provider]  aspirin 81  MG tablet Take 81 mg by mouth daily.    [provider]  finasteride (PROSCAR) 5 MG tablet Take 5 mg by mouth daily.    [provider]  furosemide (LASIX) 40 MG tablet Take 1 tablet (40 mg total) by mouth daily. 04/03/20 12/13/21  Alisa Graff, FNP  hydroxypropyl methylcellulose / hypromellose (ISOPTO TEARS / GONIOVISC) 2.5 % ophthalmic solution Place 1 drop into both eyes daily.    [provider]  mycophenolate (CELLCEPT) 250 MG capsule Take 1,000 mg by mouth 2 (two) times daily.     [provider]  pravastatin (PRAVACHOL) 20 MG tablet Take 1 tablet (20 mg total) by mouth daily. 07/27/14   [name], MD  prednisoLONE acetate (PRED FORTE) 1 % ophthalmic suspension Place 1 drop into the right eye daily. Per patient. 01/15/20   Enzo Bi, MD  pyridostigmine (MESTINON) 60 MG tablet Take 30 mg by mouth 6 (six) times daily.     [provider]  tacrolimus (PROTOPIC) 0.1 % ointment Apply topically 2 (two) times daily. 07/29/21   Ralene Bathe, MD  tamsulosin (FLOMAX) 0.4 MG CAPS Take 0.4 mg by mouth daily.    [provider]     Critical care time: 60 minutes

## 2022-06-14 NOTE — ED Provider Notes (Signed)
[facility] Provider Note    Event Date/Time   First MD Initiated Contact with Patient 06/14/22 1459     (approximate)   History   Shortness of Breath   HPI  [name] is a 88 y.o. male with history of myasthenia gravis on immunosuppressive medications, venous stasis, CHF, moderate tricuspid insufficiency who comes in who comes in with increased shortness of breath.  Patient was outside doing yard work.  O2 saturation was 77 on room air and increased to 86 on 2 L.  Patient reports being fine yesterday and actually had a checkup appointment where he was doing well.  He reports sudden onset of shortness of breath outside.  He denies any chest pain, abdominal pain, any falls hitting his head.  He does have venous stasis and has his legs wrapped but denies any new swelling.   Physical Exam   Triage Vital Signs: ED Triage Vitals [06/14/22 1453]  Enc Vitals Group     BP 117/71     Pulse Rate 100     Resp (!) 23     Temp 98 F (36.7 C)     Temp src      SpO2 (!) 86 %     Weight      Height      Head Circumference      Peak Flow      Pain Score 0     Pain Loc      Pain Edu?      Excl. in [place]?     Most recent vital signs: Vitals:   06/14/22 1453  BP: 117/71  Pulse: 100  Resp: (!) 23  Temp: 98 F (36.7 C)  SpO2: (!) 86%     General: Awake, no distress.  CV:  Good peripheral perfusion.  Resp:  Normal effort.  Increased work of breathing with minimal wheeze and crackles noted Abd:  No distention.  Other:  Patient has lymphedema wraps noted around his legs   ED Results / Procedures / Treatments   Labs (all labs ordered are listed, but only abnormal results are displayed) Labs Reviewed  BASIC METABOLIC PANEL  CBC  TROPONIN I (HIGH SENSITIVITY)     EKG  My interpretation of EKG:  Sinus rate of 99 without any ST elevation, T wave inversion in V6, widened qrs.  RADIOLOGY I have reviewed the xray personally and interpreted and  patient has evidence of pulmonary edema  PROCEDURES:  Critical Care performed: Yes, see critical care procedure note(s)  .1-3 Lead EKG Interpretation  Performed by: Vanessa Rolesville, MD Authorized by: Vanessa Shadybrook, MD     Interpretation: normal     ECG rate:  90   ECG rate assessment: normal     Rhythm: sinus rhythm     Ectopy: none     Conduction: normal   .Critical Care  Performed by: Vanessa Raceland, MD Authorized by: Vanessa Vanderbilt, MD   Critical care provider statement:    Critical care time (minutes):  30   Critical care was necessary to treat or prevent imminent or life-threatening deterioration of the following conditions:  Respiratory failure   Critical care was time spent personally by me on the following activities:  Development of treatment plan with patient or surrogate, discussions with consultants, evaluation of patient's response to treatment, examination of patient, ordering and review of laboratory studies, ordering and review of radiographic studies, ordering and performing treatments and interventions, pulse oximetry,  re-evaluation of patient's condition and review of old [place] ED: Medications  pyridostigmine (MESTINON) injection 1 mg (has no administration in time range)  0.9 %  sodium chloride infusion (has no administration in time range)  norepinephrine (LEVOPHED) '4mg'$  in 264m (0.016 mg/mL) premix infusion (has no administration in time range)  docusate sodium (COLACE) capsule 100 mg (has no administration in time range)  polyethylene glycol (MIRALAX / GLYCOLAX) packet 17 g (has no administration in time range)  enoxaparin (LOVENOX) injection 40 mg (has no administration in time range)  ipratropium-albuterol (DUONEB) 0.5-2.5 (3) MG/3ML nebulizer solution 3 mL (has no administration in time range)  ipratropium-albuterol (DUONEB) 0.5-2.5 (3) MG/3ML nebulizer solution 3 mL (3 mLs Nebulization Given 06/14/22 1601)  furosemide (LASIX)  injection 40 mg (40 mg Intravenous Given 06/14/22 1619)     IMPRESSION / MDM / ASSESSMENT AND PLAN / ED COURSE  I reviewed the triage vital signs and the nursing notes.   Patient's presentation is most consistent with severe exacerbation of chronic illness.   Patient comes in with respiratory distress patient is currently on 4 L with minimal work of breathing.  We will get respiratory to get a NIF to evaluate for his myasthenia gravis to get chest x-ray to evaluate for any pulmonary edema, denies any history of COPD but does have a light wheeze we will trial a DuoNeb, consider ACS.  I was called to the room due to patient having increase in work of breathing.  Patient was placed on BiPAP and patient's blood pressures were notably low.  Chest x-ray consistent with pulmonary edema so we will give some IV diuresis but may need to start some Levophed to help support blood pressures with diuresis.   4:15 PM discussed with neurology Dr kLeonel Ramsaywho recommended doing the IV mestinon.  Confirmed with pharmacy the dosing.  This does not sound like an MG flareup due to the sudden onset seems more likely CHF related   4:28 PM patient was placed on BiPAP and looks much more comfortable.  Blood pressures are slightly soft we will give some Lasix and use Levophed to support him as needed.  Patient was able to answer questions we discussed CODE STATUS with patient and he states that if his heart was to stop he would not want resuscitation and that he would prefer to be DNR.  He is open to pressors and he is open to intubation if his heart has not stopped.  When asked who he would want to make medical decisions for him if he states  his son who is his oldest son.  I discussed with the ICU team for admission.   The patient is on the cardiac monitor to evaluate for evidence of arrhythmia and/or significant heart rate changes.      FINAL CLINICAL IMPRESSION(S) / ED DIAGNOSES   Final diagnoses:  Acute  respiratory failure with hypoxia (HCC)  Acute on chronic congestive heart failure, unspecified heart failure type ([facility]  [place]ia [place]is ([place]     Rx / DC Orders   ED Discharge Orders     None        Note:  This document was prepared using Dragon voice recognition software and may include unintentional dictation errors.   FVanessa Ackermanville MD 06/14/22 [phone]

## 2022-06-15 ENCOUNTER — Inpatient Hospital Stay: Payer: Medicare Other

## 2022-06-15 DIAGNOSIS — J9622 Acute and chronic respiratory failure with hypercapnia: Secondary | ICD-10-CM | POA: Diagnosis not present

## 2022-06-15 DIAGNOSIS — I214 Non-ST elevation (NSTEMI) myocardial infarction: Secondary | ICD-10-CM | POA: Diagnosis present

## 2022-06-15 DIAGNOSIS — J9621 Acute and chronic respiratory failure with hypoxia: Secondary | ICD-10-CM | POA: Diagnosis not present

## 2022-06-15 LAB — CBC
HCT: 40.6 % (ref 39.0–52.0)
Hemoglobin: 12.3 g/dL — ABNORMAL LOW (ref 13.0–17.0)
MCH: 28.8 pg (ref 26.0–34.0)
MCHC: 30.3 g/dL (ref 30.0–36.0)
MCV: 95.1 fL (ref 80.0–100.0)
Platelets: 143 10*3/uL — ABNORMAL LOW (ref 150–400)
RBC: 4.27 MIL/uL (ref 4.22–5.81)
RDW: 15.2 % (ref 11.5–15.5)
WBC: 8.1 10*3/uL (ref 4.0–10.5)
nRBC: 0 % (ref 0.0–0.2)

## 2022-06-15 LAB — D-DIMER, QUANTITATIVE: D-Dimer, Quant: 1 ug/mL-FEU — ABNORMAL HIGH (ref 0.00–0.50)

## 2022-06-15 LAB — BASIC METABOLIC PANEL
Anion gap: 8 (ref 5–15)
BUN: 20 mg/dL (ref 8–23)
CO2: 33 mmol/L — ABNORMAL HIGH (ref 22–32)
Calcium: 8.6 mg/dL — ABNORMAL LOW (ref 8.9–10.3)
Chloride: 99 mmol/L (ref 98–111)
Creatinine, Ser: 1.11 mg/dL (ref 0.61–1.24)
GFR, Estimated: >60 mL/min (ref >60)
Glucose, Bld: 121 mg/dL — ABNORMAL HIGH (ref 70–99)
Potassium: 3.8 mmol/L (ref 3.5–5.1)
Sodium: 140 mmol/L (ref 135–145)

## 2022-06-15 LAB — BLOOD GAS, VENOUS
Acid-Base Excess: 12.9 mmol/L — ABNORMAL HIGH (ref 0.0–2.0)
Bicarbonate: 40.2 mmol/L — ABNORMAL HIGH (ref 20.0–28.0)
O2 Saturation: 89.5 %
Patient temperature: 37
pCO2, Ven: 62 mmHg — ABNORMAL HIGH (ref 44–60)
pH, Ven: 7.42 (ref 7.25–7.43)
pO2, Ven: 55 mmHg — ABNORMAL HIGH (ref 32–45)

## 2022-06-15 LAB — TROPONIN I (HIGH SENSITIVITY)
Troponin I (High Sensitivity): 3866 ng/L (ref <18)
Troponin I (High Sensitivity): 4019 ng/L (ref <18)
Troponin I (High Sensitivity): 6814 ng/L (ref <18)
Troponin I (High Sensitivity): 7420 ng/L (ref <18)
Troponin I (High Sensitivity): 8437 ng/L (ref <18)
Troponin I (High Sensitivity): 8893 ng/L (ref <18)

## 2022-06-15 LAB — MAGNESIUM: Magnesium: 2.1 mg/dL (ref 1.7–2.4)

## 2022-06-15 LAB — PHOSPHORUS: Phosphorus: 4.7 mg/dL — ABNORMAL HIGH (ref 2.5–4.6)

## 2022-06-15 LAB — GLUCOSE, CAPILLARY: Glucose-Capillary: 152 mg/dL — ABNORMAL HIGH (ref 70–99)

## 2022-06-15 LAB — PROCALCITONIN: Procalcitonin: 0.31 ng/mL

## 2022-06-15 LAB — HEPARIN LEVEL (UNFRACTIONATED)
Heparin Unfractionated: 0.34 IU/mL (ref 0.30–0.70)
Heparin Unfractionated: 0.19 IU/mL — ABNORMAL LOW (ref 0.30–0.70)

## 2022-06-15 MED ORDER — HEPARIN BOLUS VIA INFUSION
3000.0000 [IU] | Freq: Once | INTRAVENOUS | Status: AC
  Administered 2022-06-15: 3000 [IU] via INTRAVENOUS
  Filled 2022-06-15: qty 3000

## 2022-06-15 MED ORDER — PYRIDOSTIGMINE BROMIDE 60 MG PO TABS
30.0000 mg | ORAL_TABLET | Freq: Four times a day (QID) | ORAL | Status: DC
  Administered 2022-06-15 – 2022-06-18 (×14): 30 mg via ORAL
  Filled 2022-06-15 (×17): qty 0.5

## 2022-06-15 MED ORDER — PRAVASTATIN SODIUM 20 MG PO TABS
20.0000 mg | ORAL_TABLET | Freq: Every day | ORAL | Status: DC
  Administered 2022-06-15 – 2022-06-17 (×3): 20 mg via ORAL
  Filled 2022-06-15 (×3): qty 1

## 2022-06-15 MED ORDER — ASPIRIN 81 MG PO TBEC
81.0000 mg | DELAYED_RELEASE_TABLET | Freq: Every day | ORAL | Status: DC
  Administered 2022-06-15 – 2022-06-18 (×4): 81 mg via ORAL
  Filled 2022-06-15 (×5): qty 1

## 2022-06-15 NOTE — Consult Note (Signed)
PHARMACY CONSULT NOTE - FOLLOW UP  Pharmacy Consult for Electrolyte Monitoring and Replacement   Recent Labs: Potassium (mmol/L)  Date Value  06/15/2022 3.8   Magnesium (mg/dL)  Date Value  06/15/2022 2.1   Calcium (mg/dL)  Date Value  06/15/2022 8.6 (L)   Albumin (g/dL)  Date Value  06/14/2022 4.4   Phosphorus (mg/dL)  Date Value  06/15/2022 4.7 (H)   Sodium (mmol/L)  Date Value  06/15/2022 140     Assessment: 88 year old male patient with a history most significant for HFrEF and Myasthenia Gravis who presents to the ED with a one day history of shortness of breath.  On lasix 40 mg IV BID.   Goal of Therapy:  WNL  Plan:  No replacement needed at this time.  F/u with AM labs.   Oswald Hillock ,PharmD Clinical Pharmacist 06/15/2022 8:29 AM

## 2022-06-15 NOTE — Progress Notes (Signed)
NIF -24 BEST EFFORT

## 2022-06-15 NOTE — Consult Note (Addendum)
[facility] Cardiology  CARDIOLOGY CONSULT NOTE  Patient ID: [name] MRN: [id number] DOB/AGE: March 19, 1926 89 y.o.  Admit date: 06/14/2022 Referring Physician [place] Primary Physician [facility] Primary Cardiologist Nehemiah Massed Reason for Consultation NSTEMI  HPI: 88 year old gentleman referred for evaluation of NSTEMI and congestive heart failure.  Patient has a history of known coronary artery disease by cardiac catheterization 04/06/2014.  The patient has chronic systolic congestive heart failure.  Patient also has a history of myasthenia gravis followed at [facility].  The patient presents with shortness of breath, chest x-ray revealing pulmonary edema, BNP 755, consistent with acute on chronic systolic congestive heart failure, HFrEF with EF 30-35% by echocardiogram 09/30/2021, followed by Dr. Nehemiah Massed.  Admission labs also noted for elevated troponin (594, 1709, 3866, 4019, 6814, 7420), in the absence of chest pain, with ECG revealing sinus rhythm at 87 bpm, with nonspecific intraventricular conduction delay, with inferolateral T wave inversions, which appears similar to prior ECG from 09/29/2021, consistent with NSTEMI.  The patient continues to deny chest pain.  The patient reports shortness of breath has improved after initial diuresis.  Review of systems complete and found to be negative unless listed above     Past Medical History:  Diagnosis Date   Abnormal chest CT 03/28/2017   Allergy    Anemia    Angina pectoris ([place]) 04/02/2011   Aortic aneurysm ([place]) 03/28/2017   Saw Dr Genevive Bi 04/16/17 - recommended f/u CT in 3 months.     Atherosclerosis of both carotid arteries 07/17/2014   Basal cell carcinoma 06/25/2021   Left ant neck. Nodulocystic pattern, close to margin   Benign lipomatous neoplasm of skin and subcutaneous tissue of left arm 10/04/2013   Bilateral carotid artery stenosis 08/30/2014   Overview:  Less than 50% 2015   Bladder cancer ([place]) 03/05/2013   CAD (coronary artery disease)  06/18/2014   Cancer ([place]) 11/24/2012   bladder. BCG treatments by Dr Jacqlyn Larsen.   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 WD SCC. Re-shaved 01/14/2016. Excised 02/05/2016, margins free.   Squamous cell carcinoma of skin 11/03/2018   Right cheek ant. to sideburn. Poorly differentiated.   Squamous cell carcinoma of skin 02/02/2019   Right mid dorsum forearm. WD SCC. EDC.    Past Surgical History:  Procedure Laterality Date   [place]    Medications Prior to Admission  Medication Sig Dispense Refill Last Dose   aspirin 81 MG tablet Take 81 mg by mouth daily.   06/13/2022   finasteride (PROSCAR) 5 MG tablet Take 5 mg by mouth daily.   06/13/2022   furosemide (LASIX) 40 MG tablet Take 1 tablet (  40 mg total) by mouth daily. (Patient taking differently: Take 40 mg by mouth daily.) 90 tablet 3 06/13/2022   mycophenolate (CELLCEPT) 250 MG capsule Take 1,000 mg by mouth 2 (two) times daily.    06/13/2022   pravastatin (PRAVACHOL) 20 MG tablet Take 1 tablet (20 mg total) by mouth daily. 90 tablet 1 06/13/2022   pyridostigmine (MESTINON) 60 MG tablet Take 30 mg by mouth 6 (six)  times daily.    06/13/2022   tamsulosin (FLOMAX) 0.4 MG CAPS Take 0.4 mg by mouth daily.   06/13/2022   Acetaminophen (ARTHRITIS PAIN PO) Take 650 mg by mouth.      hydroxypropyl methylcellulose / hypromellose (ISOPTO TEARS / GONIOVISC) 2.5 % ophthalmic solution Place 1 drop into both eyes daily.      prednisoLONE acetate (PRED FORTE) 1 % ophthalmic suspension Place 1 drop into the right eye daily. Per patient. 5 mL 0    tacrolimus (PROTOPIC) 0.1 % ointment Apply topically 2 (two) times daily. 100 g 2    Social History   Socioeconomic History   Marital status: Widowed    Spouse name: Not on file   Number of children: Not on file   Years of education: Not on file   Highest education level: Not on file  Occupational History   Not on file  Tobacco Use   Smoking status: Former    Types: Cigarettes    Quit date: 09/29/1958    Years since quitting: 63.7   Smokeless tobacco: Never  Vaping Use   Vaping Use: Never used  Substance and Sexual Activity   Alcohol use: Yes    Alcohol/week: 0.0 standard drinks of alcohol    Comment: occasionally   Drug use: No   Sexual activity: Never  Other Topics Concern   Not on file  Social History Narrative   Pt is married with 2 children - 1 son, 1 daughter. Previously self-employed in [place] Strain: [place]  (11/19/2021)   Overall Financial Resource Strain (CARDIA)    Difficulty of Paying Living Expenses: Not hard at all  Food Insecurity: No Food Insecurity (11/19/2021)   Hunger Vital Sign    Worried About Running Out of Food in the Last Year: Never true    [place] in the Last Year: Never true  Transportation Needs: No Transportation Needs (11/19/2021)   PRAPARE - Hydrologist (Medical): No    Lack of Transportation (Non-Medical): No  Physical Activity: Not on file  Stress: No Stress Concern Present (11/19/2021)   [place]    Feeling of Stress : Not at all  Social Connections: Unknown (11/19/2021)   Social Connection and Isolation Panel [NHANES]    Frequency of Communication with Friends and Family: More than three times a week    Frequency of Social Gatherings with Friends and Family: More than three times a week    Attends Religious Services: More than 4 times per year    Active Member of [employer] or Organizations: Not on file    Attends Archivist Meetings: Not on file    Marital Status: Widowed  Intimate Partner Violence: Not At Risk (11/19/2021)   Humiliation, Afraid, Rape, and Kick questionnaire    Fear of Current or Ex-Partner: No    Emotionally Abused: No    Physically Abused: No    Sexually Abused:  No    Family History  Problem Relation Age of Onset   Lung cancer Sister    Prostate cancer Brother    Lung cancer Brother    Arthritis Other        parent   Colon cancer Neg Hx       Review of systems complete and found to be negative unless listed above      PHYSICAL EXAM  General: Well developed, well nourished, in no acute distress HEENT:  Normocephalic and atramatic Neck:  No JVD.  Lungs: Clear bilaterally to auscultation and percussion. Heart: HRRR . Normal S1 and S2 without gallops or murmurs.  Abdomen: Bowel sounds are positive, abdomen soft and non-tender  Msk:  Back normal, normal gait. Normal strength and tone for age. Extremities: No clubbing, cyanosis or edema.   Neuro: Alert and oriented X 3. Psych:  Good affect, responds appropriately  Labs:   Lab Results  Component Value Date   WBC 8.1 06/15/2022   HGB 12.3 (L) 06/15/2022   HCT 40.6 06/15/2022   MCV 95.1 06/15/2022   PLT 143 (L) 06/15/2022    Recent Labs  Lab 06/14/22 1526 06/15/22 0302  NA 141 140  K 3.9 3.8  CL 100 99  CO2 30 33*  BUN 19 20  CREATININE 1.06 1.11  CALCIUM 9.2 8.6*  PROT 7.0  --   BILITOT 1.6*  --   ALKPHOS 87  --   ALT 19  --   AST  28  --   GLUCOSE 132* 121*   Lab Results  Component Value Date   TROPONINI 0.43 (HH) 12/29/2016    Lab Results  Component Value Date   CHOL 140 02/22/2021   CHOL 159 11/15/2020   CHOL 155 07/16/2020   Lab Results  Component Value Date   HDL 44.70 02/22/2021   HDL 57.20 11/15/2020   HDL 53.80 07/16/2020   Lab Results  Component Value Date   LDLCALC 71 02/22/2021   LDLCALC 87 11/15/2020   LDLCALC 86 07/16/2020   Lab Results  Component Value Date   TRIG 121.0 02/22/2021   TRIG 74.0 11/15/2020   TRIG 77.0 07/16/2020   Lab Results  Component Value Date   CHOLHDL 3 02/22/2021   CHOLHDL 3 11/15/2020   CHOLHDL 3 07/16/2020   Lab Results  Component Value Date   LDLDIRECT 143.5 08/26/2013      Radiology: DG Chest Port 1 View  Result Date: 06/15/2022 CLINICAL DATA:  Heart failure EXAM: PORTABLE CHEST 1 VIEW COMPARISON:  06/14/2022 FINDINGS: 0508 hours. The cardio pericardial silhouette is enlarged. Vascular congestion with diffuse interstitial pulmonary edema noted. Mild left base collapse/consolidation with probable tiny left effusion. Bones are diffusely demineralized. Telemetry leads overlie the chest. IMPRESSION: 1. Cardiomegaly with pulmonary edema pattern. 2. Mild left base collapse/consolidation with probable tiny left effusion. Electronically Signed   By: Misty Stanley M.D.   On: 06/15/2022 06:32   DG Chest Port 1 View  Result Date: 06/14/2022 CLINICAL DATA:  Shortness of breath. EXAM: PORTABLE CHEST 1 VIEW COMPARISON:  Chest x-ray dated September 29, 2021. FINDINGS: The heart size and mediastinal contours are within normal limits. Pulmonary vascular congestion and mid and lower lung interstitial thickening similar to prior study. Small left pleural effusion. No pneumothorax. No acute osseous abnormality. IMPRESSION: 1. Congestive heart failure, similar to January. Electronically Signed   By: Titus Dubin M.D.   On: 06/14/2022 15:19    EKG: Sinus rhythm 87 bpm,  nonspecific intraventricular  conduction delay  ASSESSMENT AND PLAN:   1.  NSTEMI, with elevated troponin (594, 1709, 3866, 4019, 6814, 7420), in the absence of chest pain, with nondiagnostic ECG, in the setting of acute on chronic systolic congestive heart failure, HFrEF, on heparin infusion 2.  Acute on chronic systolic congestive heart failure, HFrEF, EF 30-35% by echo 09/30/2021, clinically improved after initial diuresis 3.  CAD, two-vessel coronary artery disease by cardiac catheterization 04/06/2014, without history of surgical or percutaneous intervention 4.  Myasthenia gravis, followed at [facility]  Recommendations  1.  Agree with overall current therapy 2.  Continue diuresis 3.  Carefully monitor renal status 4.  Continue heparin infusion 48-72 hours 5.  Start low-dose aspirin 6.  Resume pravastatin 7.  Defer cardiac catheterization at this time, in the absence of chest pain, known ischemic cardiomyopathy, and in light of advanced age.  I discussed this with the patient who agrees with initial conservative management.  Signed: Isaias Cowman MD,PhD, [facility] 06/15/2022, 8:48 AM

## 2022-06-15 NOTE — Progress Notes (Signed)
[place] for Heparin  Indication: chest pain/ACS  Allergies  Allergen Reactions   Beta Adrenergic Blockers Other (See Comments)    Beta Blocker will precipitate acute weakness in Lambert-Eaton Syndrome or Myasthenia Gravis   Calcium Channel Blockers Other (See Comments)    Calcium Channel Blocker- will precipitate acute weakness in Lambert-Eaton Syndrome.   Ciprofloxacin Other (See Comments)    Last resort due to Myasthia Gravis   Sulfa Antibiotics Rash    Patient Measurements: Height: '5\' 10"'$  (177.8 cm) Weight: 84.6 kg (186 lb 8.2 oz) IBW/kg (Calculated) : 73 Heparin Dosing Weight: 84.6 kg   Vital Signs: Temp: 97.9 F (36.6 C) (09/17 0737) Temp Source: Oral (09/17 0737) BP: 94/60 (09/17 0800) Pulse Rate: 64 (09/17 0800)  Labs: Recent Labs    06/14/22 1526 06/14/22 1655 06/15/22 0037 06/15/22 0302 06/15/22 0459 06/15/22 0752  HGB 12.9*  --   --  12.3*  --   --   HCT 42.2  --   --  40.6  --   --   PLT 165  --   --  143*  --   --   APTT 30  --   --   --   --   --   LABPROT 14.7  --   --   --   --   --   INR 1.2  --   --   --   --   --   HEPARINUNFRC  --   --   --   --   --  0.34  CREATININE 1.06  --   --  1.11  --   --   TROPONINIHS 25*   < > 4,019* 6,814* 7,420*  --    < > = values in this interval not displayed.     Estimated Creatinine Clearance: 42.9 mL/min (by C-G formula based on SCr of 1.11 mg/dL).   Medical History: Past Medical History:  Diagnosis Date   Abnormal chest CT 03/28/2017   Allergy    Anemia    Angina pectoris ([place]) 04/02/2011   Aortic aneurysm ([place]) 03/28/2017   Saw Dr Genevive Bi 04/16/17 - recommended f/u CT in 3 months.     Atherosclerosis of both carotid arteries 07/17/2014   Basal cell carcinoma 06/25/2021   Left ant neck. Nodulocystic pattern, close to margin   Benign lipomatous neoplasm of skin and subcutaneous tissue of left arm 10/04/2013   Bilateral carotid artery stenosis 08/30/2014    Overview:  Less than 50% 2015   Bladder cancer ([place]) 03/05/2013   CAD (coronary artery disease) 06/18/2014   Cancer ([place]) 11/24/2012   bladder. BCG treatments by Dr Jacqlyn Larsen.   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 WD SCC. Re-shaved 01/14/2016. Excised 02/05/2016, margins free.   Squamous cell carcinoma of skin 11/03/2018   Right cheek ant. to sideburn. Poorly differentiated.   Squamous cell carcinoma of skin 02/02/2019   Right mid dorsum forearm. WD SCC. EDC.    Medications:  Medications Prior to Admission  Medication Sig Dispense Refill Last Dose   aspirin 81 MG tablet Take 81 mg by mouth daily.   06/13/2022   finasteride (PROSCAR) 5 MG tablet Take 5 mg by mouth daily.   06/13/2022   furosemide (LASIX) 40 MG tablet Take 1 tablet (40 mg total) by mouth daily. (Patient taking differently: Take 40 mg by mouth daily.) 90 tablet 3 06/13/2022   mycophenolate (CELLCEPT) 250 MG capsule Take 1,000 mg by mouth 2 (two) times daily.    06/13/2022   pravastatin (PRAVACHOL) 20 MG tablet Take 1 tablet (20 mg total) by mouth daily. 90 tablet 1 06/13/2022   pyridostigmine (MESTINON) 60 MG tablet Take 30 mg by mouth 6 (six) times daily.    06/13/2022    tamsulosin (FLOMAX) 0.4 MG CAPS Take 0.4 mg by mouth daily.   06/13/2022   Acetaminophen (ARTHRITIS PAIN PO) Take 650 mg by mouth.      hydroxypropyl methylcellulose / hypromellose (ISOPTO TEARS / GONIOVISC) 2.5 % ophthalmic solution Place 1 drop into both eyes daily.      prednisoLONE acetate (PRED FORTE) 1 % ophthalmic suspension Place 1 drop into the right eye daily. Per patient. 5 mL 0    tacrolimus (PROTOPIC) 0.1 % ointment Apply topically 2 (two) times daily. 100 g 2     Assessment: Pharmacy consulted to dose heparin in this 88 year old male admitted with respiratory failure, now with NSTEMI.  No prior anticoag noted.  9/17 0752 HL 0.34   Goal of Therapy:  Heparin level 0.3-0.7 units/ml Monitor platelets by anticoagulation protocol: Yes   Plan:  Heparin level is therapeutic. Will continue heparin infusion at 1150 units/hr. Recheck heparin level in 8 hours. CBC daily while on heparin.    Oswald Hillock, PharmD, BCPS 06/15/2022,8:25 AM

## 2022-06-15 NOTE — Consult Note (Signed)
Neurology Consultation Reason for Consult: Myasthenia gravis Referring Physician: Carlynn Herald  CC: Shortness of breath  History is obtained from: Patient, chart review  HPI: [name] is a 88 y.o. 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  His myasthenia has been relatively well controlled, and in fact the day before admission he saw his neurologist and was doing well.  He developed shortness of breath yesterday and presented, his chest x-ray demonstrated evidence of congestive heart failure he was started on BiPAP.  He denies any sensation of generalized weakness.  He denies any double vision, denies any trouble swallowing.   Past Medical History:  Diagnosis Date   Abnormal chest CT 03/28/2017   Allergy    Anemia    Angina pectoris ([place]) 04/02/2011   Aortic aneurysm ([place]) 03/28/2017   Saw Dr Genevive Bi 04/16/17 - recommended f/u CT in 3 months.     Atherosclerosis of both carotid arteries 07/17/2014   Basal cell carcinoma 06/25/2021   Left ant neck. Nodulocystic pattern, close to margin   Benign lipomatous neoplasm of skin and subcutaneous tissue of left arm 10/04/2013   Bilateral carotid artery stenosis 08/30/2014   Overview:  Less than 50% 2015   Bladder cancer ([place]) 03/05/2013   CAD (coronary artery disease) 06/18/2014   Cancer ([place]) 11/24/2012   bladder. BCG treatments by Dr Jacqlyn Larsen.   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 WD SCC. Re-shaved 01/14/2016. Excised 02/05/2016, margins free.   Squamous cell carcinoma of skin 11/03/2018   Right cheek ant. to sideburn. Poorly differentiated.   Squamous cell carcinoma of skin 02/02/2019   Right mid dorsum forearm. WD SCC. EDC.     Family History  Problem Relation Age of Onset   Lung cancer Sister    Prostate cancer Brother    Lung cancer Brother    Arthritis Other        parent   Colon cancer Neg Hx      Social History:  reports that he quit smoking about 63 years ago. His smoking use included cigarettes. He has never used smokeless tobacco. He reports current alcohol use. He reports that he does not use drugs.   Exam: Current vital signs: BP 94/60 (BP Location: Left Arm)   Pulse 64   Temp 97.9 F (36.6 C) (Oral)   Resp 16   Ht '5\' 10"'$  (1.778 m)   Wt 84.6 kg   SpO2 94%   BMI 26.76 kg/m  Vital signs in last 24 hours: Temp:  [97.5 F (36.4 C)-98 F (36.7 C)] 97.9 F (  36.6 C) (09/17 0737) Pulse Rate:  [60-106] 64 (09/17 0800) Resp:  [14-46] 16 (09/17 0800) BP: (92-125)/(52-97) 94/60 (09/17 0800) SpO2:  [86 %-100 %] 94 % (09/17 0800) FiO2 (%):  [30 %-40 %] 30 % (09/17 0400) Weight:  [84.6 kg] 84.6 kg (09/16 1812)   Physical Exam  Respiratory: He is able to count to mid 20s on a single breath  Neuro: Mental Status: Patient is awake, alert, oriented to person, place, month, year, and situation. Patient is able to give a clear and coherent history. No signs of aphasia or neglect Cranial Nerves: II: Visual Fields are full. Pupils are equal,  round, and reactive to light.   III,IV, VI: EOMI without ptosis or diploplia.  V: Facial sensation is symmetric to temperature VII: Facial movement is symmetric.  VIII: hearing is intact to voice X: Uvula elevates symmetrically XI: Shoulder shrug is symmetric. XII: tongue is midline without atrophy or fasciculations.  Motor: Tone is normal. Bulk is normal. 5/5 strength was present in all four extremities.  He has good neck flexion weakness 5/5 Sensory: Sensation is symmetric to light touch and temperature in the arms and legs. Cerebellar: No clear ataxia     I have reviewed labs in epic and the results pertinent to this consultation are: Creatinine 1.11  I have reviewed the images obtained: Chest x-ray-significant vascular congestion consistent with CHF  Impression: 88 year old male who presented with shortness of breath in the setting of CHF exacerbation.  He has no evidence of myasthenia on exam, he has good neck flexion and I strongly doubt that myasthenia is playing a significant role in his current presentation.  I would favor continuing his home Mestinon  Recommendations: 1) continue home Mestinon 30 mg 4 times daily 2) no objection to holding CellCept in the short-term per CCM, restart once concern of infection is passed 3) treatment of heart failure per CCM/internal medicine  4) neurology will be available on an as-needed basis   Roland Rack, MD [facility] [phone]  If 7pm- 7am, please page neurology on call as listed in [place].

## 2022-06-15 NOTE — Progress Notes (Addendum)
[place] for Heparin  Indication: chest pain/ACS  Allergies  Allergen Reactions   Beta Adrenergic Blockers Other (See Comments)    Beta Blocker will precipitate acute weakness in Lambert-Eaton Syndrome or Myasthenia Gravis   Calcium Channel Blockers Other (See Comments)    Calcium Channel Blocker- will precipitate acute weakness in Lambert-Eaton Syndrome.   Ciprofloxacin Other (See Comments)    Last resort due to Myasthia Gravis   Sulfa Antibiotics Rash    Patient Measurements: Height: '5\' 10"'$  (177.8 cm) Weight: 84.6 kg (186 lb 8.2 oz) IBW/kg (Calculated) : 73 Heparin Dosing Weight: 84.6 kg   Vital Signs: Temp: 98.6 F (37 C) (09/17 1555) Temp Source: Oral (09/17 1555) BP: 99/53 (09/17 1500) Pulse Rate: 68 (09/17 1555)  Labs: Recent Labs    06/14/22 1526 06/14/22 1655 06/15/22 0302 06/15/22 0459 06/15/22 0752 06/15/22 1224 06/15/22 1555  HGB 12.9*  --  12.3*  --   --   --   --   HCT 42.2  --  40.6  --   --   --   --   PLT 165  --  143*  --   --   --   --   APTT 30  --   --   --   --   --   --   LABPROT 14.7  --   --   --   --   --   --   INR 1.2  --   --   --   --   --   --   HEPARINUNFRC  --   --   --   --  0.34  --  0.19*  CREATININE 1.06  --  1.11  --   --   --   --   TROPONINIHS 25*   < > 6,814* 7,420* 8,893* 8,437*  --    < > = values in this interval not displayed.     Estimated Creatinine Clearance: 42.9 mL/min (by C-G formula based on SCr of 1.11 mg/dL).   Medical History: Past Medical History:  Diagnosis Date   Abnormal chest CT 03/28/2017   Allergy    Anemia    Angina pectoris ([place]) 04/02/2011   Aortic aneurysm ([place]) 03/28/2017   Saw Dr Genevive Bi 04/16/17 - recommended f/u CT in 3 months.     Atherosclerosis of both carotid arteries 07/17/2014   Basal cell carcinoma 06/25/2021   Left ant neck. Nodulocystic pattern, close to margin   Benign lipomatous neoplasm of skin and subcutaneous tissue of left arm  10/04/2013   Bilateral carotid artery stenosis 08/30/2014   Overview:  Less than 50% 2015   Bladder cancer ([place]) 03/05/2013   CAD (coronary artery disease) 06/18/2014   Cancer ([place]) 11/24/2012   bladder. BCG treatments by Dr Jacqlyn Larsen.   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 WD SCC. Re-shaved 01/14/2016. Excised 02/05/2016, margins free.   Squamous cell carcinoma of skin 11/03/2018   Right cheek ant. to sideburn. Poorly differentiated.   Squamous cell carcinoma of skin 02/02/2019   Right mid dorsum forearm. WD SCC. EDC.    Medications:  Medications Prior to Admission  Medication Sig Dispense Refill Last Dose   aspirin 81 MG tablet Take 81 mg by mouth daily.   06/13/2022   finasteride (PROSCAR) 5 MG tablet Take 5 mg by mouth daily.   06/13/2022   furosemide (LASIX) 40 MG tablet Take 1 tablet (40 mg total) by mouth daily. (Patient taking differently: Take 40 mg by mouth daily.) 90 tablet 3 06/13/2022   mycophenolate (CELLCEPT) 250 MG capsule Take 1,000 mg by mouth 2 (two) times daily.    06/13/2022   pravastatin (PRAVACHOL) 20 MG tablet Take 1 tablet (20 mg total) by mouth daily. 90 tablet 1 06/13/2022   pyridostigmine (MESTINON) 60 MG tablet  Take 30 mg by mouth 6 (six) times daily.    06/13/2022   tamsulosin (FLOMAX) 0.4 MG CAPS Take 0.4 mg by mouth daily.   06/13/2022   Acetaminophen (ARTHRITIS PAIN PO) Take 650 mg by mouth.      hydroxypropyl methylcellulose / hypromellose (ISOPTO TEARS / GONIOVISC) 2.5 % ophthalmic solution Place 1 drop into both eyes daily.      prednisoLONE acetate (PRED FORTE) 1 % ophthalmic suspension Place 1 drop into the right eye daily. Per patient. 5 mL 0    tacrolimus (PROTOPIC) 0.1 % ointment Apply topically 2 (two) times daily. 100 g 2     Assessment: Pharmacy consulted to dose heparin in this 88 year old male admitted with respiratory failure, now with NSTEMI.  No prior anticoag noted.  9/17 0752 HL 0.34  9/17 1555 HL 0.19  Goal of Therapy:  Heparin level 0.3-0.7 units/ml Monitor platelets by anticoagulation protocol: Yes   Plan: Heparin level is sub-therapeutic.  Heparin bolus 3000 units Increase heparin infusion to 1450 units/hr.  Recheck heparin level in 8 hours.  CBC daily while on heparin.     Glean Salvo, PharmD Clinical Pharmacist  06/15/2022 5:08 PM

## 2022-06-15 NOTE — Progress Notes (Signed)
NAME:  [name], MRN:  [id number], DOB:  [birth year], LOS: 1 ADMISSION DATE:  06/14/2022  CHIEF COMPLAINT:  Shortness of breath   History of Present Illness:  [name] is a 88 year old male patient with a history most significant for HFrEF and Myasthenia Gravis who presents to the ED with a one day history of shortness of breath.  Patient was in his usual state of health yesterday. He went to [facility] where he was seen by his neurologist. On his way back, he stopped and had a chick fillet sandwich, which is not a usual choice of meal for him. He is usually very careful with his salt intake. He was able to ambulate at baseline yesterday and was last seen in his usual state of healthy by his daughter around 5 pm when she dropped him off. Family also reports that he did a couple of hours of yard work, something that is outside of the ordinary for him.  [name] was interviewed in the presence of his son [name] and daughter-in-law [name]. He denied having chest pain, chest tightness, chest pressure, or wheezing. He confirmed the breathing difficult was sudden in onset. He has not had any increased weakness, difficulty swallowing, increased drooling, or other bulbar symptoms given his history of myasthenia. They report some runny nose recently, but otherwise no signs or symptoms of an URTI. No fevers or chills were reported. Patient has chronic stasis dermatitis for which he is followed as an outpatient with documented improvement.  Patient was seen yesterday at [facility] by neurology for his myasthenia gravis. The note is not complete and I am unable to review it. Review of prior neurology notes shows that he was diagnosed with lambert-eaton syndrome in 1977, and myasthenia gravis in 2001. He is AChR antibody positive and is maintained on 1000 mg bid of mycophenolate and 30 mg of Pyridostigmine every 6 hours. He was also receiving injections of Efgartigimod alfa for the management of his Myasthenia Gravis.  He  also follows with Dr. Dionne Bucy from cardiology for his decompensated heart failure. He has a history of HFrEF, aortic stenosis, tricuspid regurgitation, mitral regurgitation, CAD, and Hypertension. He is on 40 mg of furosemide daily.  In the ED, he was noted to be hypoxic and hypercapnic with a VBG showing a pH of 7.2 and CO2 of 80. He was started on BiPAP 20/10 with improvement in oxygenation. He was given 40 mg of IV furosemide with improvement.  Pertinent  Medical History  #Myasthenia Gravis (AChR +ve) #Lambert Eaton Syndrome (P/Q antibody positive) #HFrEF #CAD #HTN #AS #MR #TR #BPH  Significant Hospital Events: Including procedures, antibiotic start and stop dates in addition to other pertinent events   06/14/2022: started on BiPAP  Interim History / Subjective:  Shortness of breath improved with BiPAP  Objective   Blood pressure (!) 100/59, pulse 61, temperature 97.9 F (36.6 C), temperature source Oral, resp. rate (!) 25, height '5\' 10"'$  (1.778 m), weight 84.6 kg, SpO2 94 %.    FiO2 (%):  [30 %-40 %] 30 %   Intake/Output Summary (Last 24 hours) at 06/15/2022 0805 Last data filed at 06/15/2022 0736 Gross per 24 hour  Intake --  Output 1800 ml  Net -1800 ml   Filed Weights   06/14/22 1812  Weight: 84.6 kg    Examination: Physical Exam Constitutional:      General: He is not in acute distress.    Appearance: He is obese. He is ill-appearing. He is  not toxic-appearing.  HENT:     Head: Normocephalic.     Nose: Nose normal.     Mouth/Throat:     Mouth: Mucous membranes are moist.     Comments: BiPAP mask in place Cardiovascular:     Rate and Rhythm: Normal rate and regular rhythm.     Heart sounds: Murmur heard.  Pulmonary:     Breath sounds: Rales present. No wheezing.  Abdominal:     General: There is distension.     Palpations: Abdomen is soft.  Musculoskeletal:     Cervical back: Normal range of motion.     Right lower leg: Edema present.     Left lower  leg: Edema present.     Comments: Changes of chronic stasis dermatitis in the lower extremities, wrapped  Skin:    General: Skin is warm.  Neurological:     General: No focal deficit present.     Mental Status: He is oriented to person, place, and time. Mental status is at baseline.     Sensory: No sensory deficit.     Motor: Weakness present.      Resolved Hospital Problem list    Assessment & Plan:   71M with a history of HFrEF and Myasthenia Gravis who presents to the ED with acute onset shortness of breath with imaging and exam findings consistent with acute decompensated heart failure.  Neurology #Myasthenia Gravis #Lambert Beulah Gandy Myasthenic Syndrome  His myasthenia gravis is well controlled on outpatient Pyridostigmine (30 mg q 6 hours) and Efgartigimod alfa outpatient injections. He is also on Mycophenolate 1000 mg bid. He did test positive for P/Q type calcium channel antibodies in 2014 consistent with [name] Myasthenic Syndrome. He also had positive antibodies for AChR antibodies in 2014. He was admitted in December of 2022 where he was managed for a similar chief complaint. At that time, he was also given a course of IVIG for concern of myasthenic crisis (but not steroids). Shortness of breath is sudden onset and not consistent with a myasthenic crisis.  -Continue Pyridostigmine 30 mg every 6 hours once able to take PO, switch to 1 mg IV every 6 hours while on BiPAP -Restart Mycophenolate once able to take PO intake -Hold Efgartigimod alfa (last infusion was 05/09/2022) -Follow up blood gas with improvement in CO2  Respiratory #Acute Hypoxic and Hypercapnic Respiratory Failure  Presents with shortness of breath and hypoxia requiring oxygen supplementation in the setting of pulmonary edema from decompensated HFrEF. Presentation not consistent with a myasthenic crisis given acuity. Reports a runny nose but otherwise no cough, sputum production, fever, or other symptoms of  URTI. COVID and Flu tests negative. CXR with cardiomegaly, pulmonary edema and no focal infiltrates. His VBG does suggest acute on chronic respiratory acidosis (pH 7.2 and CO2 of 88), and I suspect his hypoxia is secondary to his pulmonary edema as well as worsened by the hypercapnia given its impact on the alveolar gas equation. His bicarb on BMP is also elevated, again confirming compensation for chronic hypercapnia. Previous blood gases similarly show chronic hypercapnia. ABG also confirms chronic respiratory acidosis with compensation. CO2 much improved after BiPAP.   For management, will continue him on BiPAP for support and initiate diuresis. Will monitor his NIF bid given myasthenia. Patient might benefit from long term NIPPV (BiLevel) on discharge given his chronic hypercapnia and neuromuscular disease.  -NIF bid -continue nocturnal BiPAP, switch to [place] during the day  Cardiovascular #Acute Decompensated Heart Failure with reduced Ejection Fraction #  Valvulopathy (AS, MR, TR) #NSTEMI  Most recent TTE from 09/2021 showed an EF of 30-35% with associated diastolic disfunction and mild MR. AS and TR could not be assessed then, but the aortic valve was noted to be normal in April of 2021. Presenting with pulmonary edema and worsening shortness of breath consistent with decompensated heart failure. Given his myasthenia gravis, he is not on beta blockade. I also don't see any record of him receiving guideline directed medical therapy. Troponin was elevated on presentation and has significantly increased overnight, suggesting possible NSTEMI. This could also be secondary to heart failure, will continue to trend until peaked and initiate heparin gtt. Cardiology is consulted.  Suspect dietary indiscretion is part of reason behind compensation, albeit doesn't explain full picture, and this is likely multi-factorial. NSTEMI could also be contributing to this current decompensation. Patient could be further  optimized with guideline directed medical therapy should this not cause an interaction with his MG. I suspect chronic hypercapnia also played a direct role in patient's decompensation and hypoxia.  -Continue Furosemide, increase dose to 40 mg IV bid -heparin gtt -Strict I/O's -repeat TTE -trend troponin to peak -cardiology consult appreciated  Renal  Kidney function appears to be at baseline. Diuresis per cardiovascular section.  -monitor electrolytes closely given active diuresis and MG  GI  NPO while on BiPAP, otherwise no active issues  Endo  No history of diabetes. Monitor glucose per ICU glycemic protocol.  Hem/Onc  Lovenox for prophylaxis  ID  No signs of active infection. Patient's procalcitonin was <0.1 on presentation supporting no active infection. Patient is on Mycophenolate and is at increased risk for opportunistic infections, will monitor fever curve and hold antibiotics. Of note, his total IgG levels monitored by his outpatient providers and are low.  Armando Reichert, MD [facility]  Best Practice (right click and "Reselect all SmartList Selections" daily)   Diet/type: NPO DVT prophylaxis: LMWH GI prophylaxis: N/A Lines: N/A Foley:  Yes, and it is still needed Code Status:  limited  Labs   CBC: Recent Labs  Lab 06/14/22 1526 06/15/22 0302  WBC 9.2 8.1  HGB 12.9* 12.3*  HCT 42.2 40.6  MCV 93.0 95.1  PLT 165 143*     Basic Metabolic Panel: Recent Labs  Lab 06/14/22 1526 06/15/22 0302  NA 141 140  K 3.9 3.8  CL 100 99  CO2 30 33*  GLUCOSE 132* 121*  BUN 19 20  CREATININE 1.06 1.11  CALCIUM 9.2 8.6*  MG 2.1 2.1  PHOS 2.8 4.7*    GFR: Estimated Creatinine Clearance: 42.9 mL/min (by C-G formula based on SCr of 1.11 mg/dL). Recent Labs  Lab 06/14/22 1526 06/14/22 1859 06/14/22 1944 06/15/22 0302  PROCALCITON <0.10  --   --  0.31  WBC 9.2  --   --  8.1  LATICACIDVEN  --  1.9 1.5  --      Liver Function  Tests: Recent Labs  Lab 06/14/22 1526  AST 28  ALT 19  ALKPHOS 87  BILITOT 1.6*  PROT 7.0  ALBUMIN 4.4    No results for input(s): "LIPASE", "AMYLASE" in the last 168 hours. No results for input(s): "AMMONIA" in the last 168 hours.  ABG    Component Value Date/Time   PHART 7.45 06/14/2022 1930   PCO2ART 52 (H) 06/14/2022 1930   PO2ART 125 (H) 06/14/2022 1930   HCO3 36.1 (H) 06/14/2022 1930   O2SAT 99.7 06/14/2022 1930     Coagulation Profile: Recent  Labs  Lab 06/14/22 1526  INR 1.2    Cardiac Enzymes: No results for input(s): "CKTOTAL", "CKMB", "CKMBINDEX", "TROPONINI" in the last 168 hours.  HbA1C: Hgb A1c MFr Bld  Date/Time Value Ref Range Status  02/22/2021 12:34 PM 5.9 4.6 - 6.5 % Final    Comment:    Glycemic Control Guidelines for People with Diabetes:Non Diabetic:  <6%Goal of Therapy: <7%Additional Action Suggested:  >8%   11/15/2020 08:50 AM 5.9 4.6 - 6.5 % Final    Comment:    Glycemic Control Guidelines for People with Diabetes:Non Diabetic:  <6%Goal of Therapy: <7%Additional Action Suggested:  >8%     CBG: Recent Labs  Lab 06/14/22 [place]*    Review of Systems:   Unable to obtain   Surgical History:   Past Surgical History:  Procedure Laterality Date   [place]     Social History:   reports that he quit smoking about 63 years ago. His smoking use included cigarettes. He has never used smokeless tobacco. He reports current alcohol use. He reports that he does not use drugs.   Family History:  His family history includes Arthritis in an other family member; Lung cancer in his brother and sister; Prostate cancer in his brother. There is no history of Colon cancer.   Allergies Allergies  Allergen Reactions   Beta Adrenergic Blockers Other (See Comments)    Beta Blocker will precipitate acute weakness in Lambert-Eaton Syndrome or Myasthenia Gravis    Calcium Channel Blockers Other (See Comments)    Calcium Channel Blocker- will precipitate acute weakness in Lambert-Eaton Syndrome.   Ciprofloxacin Other (See Comments)    Last resort due to Myasthia Gravis   Sulfa Antibiotics Rash     Home Medications  Prior to Admission medications   Medication Sig Start Date End Date Taking? Authorizing Provider  Acetaminophen (ARTHRITIS PAIN PO) Take 650 mg by mouth.    [provider]  aspirin 81 MG tablet Take 81 mg by mouth daily.    [provider]  finasteride (PROSCAR) 5 MG tablet Take 5 mg by mouth daily.    [provider]  furosemide (LASIX) 40 MG tablet Take 1 tablet (40 mg total) by mouth daily. 04/03/20 12/13/21  Alisa Graff, FNP  hydroxypropyl methylcellulose / hypromellose (ISOPTO TEARS / GONIOVISC) 2.5 % ophthalmic solution Place 1 drop into both eyes daily.    [provider]  mycophenolate (CELLCEPT) 250 MG capsule Take 1,000 mg by mouth 2 (two) times daily.     [provider]  pravastatin (PRAVACHOL) 20 MG tablet Take 1 tablet (20 mg total) by mouth daily. 07/27/14   [name], MD  prednisoLONE acetate (PRED FORTE) 1 % ophthalmic suspension Place 1 drop into the right eye daily. Per patient. 01/15/20   Enzo Bi, MD  pyridostigmine (MESTINON) 60 MG tablet Take 30 mg by mouth 6 (six) times daily.     [provider]  tacrolimus (PROTOPIC) 0.1 % ointment Apply topically 2 (two) times daily. 07/29/21   Ralene Bathe, MD  tamsulosin (FLOMAX) 0.4 MG CAPS Take 0.4 mg by mouth daily.    [provider]     Critical care time: 60 minutes

## 2022-06-16 ENCOUNTER — Ambulatory Visit: Payer: Medicare Other | Admitting: Occupational Therapy

## 2022-06-16 ENCOUNTER — Encounter: Payer: Self-pay | Admitting: Student in an Organized Health Care Education/Training Program

## 2022-06-16 ENCOUNTER — Inpatient Hospital Stay
Admit: 2022-06-16 | Discharge: 2022-06-16 | Disposition: A | Discharge status: Home / Self Care | Payer: Medicare Other | Attending: Pulmonary Disease | Admitting: Pulmonary Disease

## 2022-06-16 DIAGNOSIS — J9621 Acute and chronic respiratory failure with hypoxia: Secondary | ICD-10-CM | POA: Diagnosis not present

## 2022-06-16 DIAGNOSIS — J9622 Acute and chronic respiratory failure with hypercapnia: Secondary | ICD-10-CM | POA: Diagnosis not present

## 2022-06-16 LAB — HEPARIN LEVEL (UNFRACTIONATED)
Heparin Unfractionated: 0.4 IU/mL (ref 0.30–0.70)
Heparin Unfractionated: 0.45 IU/mL (ref 0.30–0.70)

## 2022-06-16 LAB — LIPID PANEL
Cholesterol: 134 mg/dL (ref 0–200)
HDL: 49 mg/dL (ref >40)
LDL Cholesterol: 72 mg/dL (ref 0–99)
Total CHOL/HDL Ratio: 2.7 RATIO
Triglycerides: 65 mg/dL (ref <150)
VLDL: 13 mg/dL (ref 0–40)

## 2022-06-16 LAB — BASIC METABOLIC PANEL
Anion gap: 9 (ref 5–15)
BUN: 28 mg/dL — ABNORMAL HIGH (ref 8–23)
CO2: 33 mmol/L — ABNORMAL HIGH (ref 22–32)
Calcium: 8.1 mg/dL — ABNORMAL LOW (ref 8.9–10.3)
Chloride: 94 mmol/L — ABNORMAL LOW (ref 98–111)
Creatinine, Ser: 0.89 mg/dL (ref 0.61–1.24)
GFR, Estimated: >60 mL/min (ref >60)
Glucose, Bld: 110 mg/dL — ABNORMAL HIGH (ref 70–99)
Potassium: 3.4 mmol/L — ABNORMAL LOW (ref 3.5–5.1)
Sodium: 136 mmol/L (ref 135–145)

## 2022-06-16 LAB — CBC
HCT: 38.2 % — ABNORMAL LOW (ref 39.0–52.0)
Hemoglobin: 11.8 g/dL — ABNORMAL LOW (ref 13.0–17.0)
MCH: 29 pg (ref 26.0–34.0)
MCHC: 30.9 g/dL (ref 30.0–36.0)
MCV: 93.9 fL (ref 80.0–100.0)
Platelets: 131 10*3/uL — ABNORMAL LOW (ref 150–400)
RBC: 4.07 MIL/uL — ABNORMAL LOW (ref 4.22–5.81)
RDW: 15 % (ref 11.5–15.5)
WBC: 7.3 10*3/uL (ref 4.0–10.5)
nRBC: 0 % (ref 0.0–0.2)

## 2022-06-16 LAB — MAGNESIUM: Magnesium: 1.9 mg/dL (ref 1.7–2.4)

## 2022-06-16 LAB — HEMOGLOBIN A1C
Hgb A1c MFr Bld: 5.5 % (ref 4.8–5.6)
Mean Plasma Glucose: 111.15 mg/dL

## 2022-06-16 LAB — PHOSPHORUS: Phosphorus: 2.9 mg/dL (ref 2.5–4.6)

## 2022-06-16 LAB — PROCALCITONIN: Procalcitonin: 0.24 ng/mL

## 2022-06-16 MED ORDER — MYCOPHENOLATE MOFETIL 250 MG PO CAPS
1000.0000 mg | ORAL_CAPSULE | Freq: Two times a day (BID) | ORAL | Status: DC
  Administered 2022-06-16 – 2022-06-18 (×5): 1000 mg via ORAL
  Filled 2022-06-16 (×6): qty 4

## 2022-06-16 MED ORDER — FINASTERIDE 5 MG PO TABS
5.0000 mg | ORAL_TABLET | Freq: Every day | ORAL | Status: DC
  Administered 2022-06-16 – 2022-06-18 (×3): 5 mg via ORAL
  Filled 2022-06-16 (×4): qty 1

## 2022-06-16 MED ORDER — TAMSULOSIN HCL 0.4 MG PO CAPS
0.4000 mg | ORAL_CAPSULE | Freq: Every day | ORAL | Status: DC
  Administered 2022-06-16 – 2022-06-18 (×3): 0.4 mg via ORAL
  Filled 2022-06-16 (×3): qty 1

## 2022-06-16 MED ORDER — POTASSIUM CHLORIDE CRYS ER 20 MEQ PO TBCR
40.0000 meq | EXTENDED_RELEASE_TABLET | Freq: Once | ORAL | Status: AC
  Administered 2022-06-16: 40 meq via ORAL
  Filled 2022-06-16: qty 2

## 2022-06-16 MED ORDER — FUROSEMIDE 10 MG/ML IJ SOLN: 40.0000 mg | Freq: Every day | INTRAMUSCULAR | Status: DC

## 2022-06-16 MED ORDER — FUROSEMIDE 10 MG/ML IJ SOLN: 40.0000 mg | Freq: Two times a day (BID) | INTRAMUSCULAR | Status: DC

## 2022-06-16 MED ORDER — FUROSEMIDE 10 MG/ML IJ SOLN
40.0000 mg | Freq: Two times a day (BID) | INTRAMUSCULAR | Status: AC
  Administered 2022-06-17 (×2): 40 mg via INTRAVENOUS
  Filled 2022-06-16 (×2): qty 4

## 2022-06-16 NOTE — Progress Notes (Signed)
NAME:  [name], MRN:  [id number], DOB:  [birth year], LOS: 2 ADMISSION DATE:  06/14/2022, CONSULTATION DATE:  06/14/2022 CHIEF COMPLAINT:  Shortness of Breath   Brief Pt Description / Synopsis:  30M with a history of HFrEF and Myasthenia Gravis who presents to the ED with acute onset shortness of breath with imaging and exam findings consistent with acute decompensated heart failure requiring BiPAP.  History of Present Illness:  [name] is a 88 year old male patient with a history most significant for HFrEF and Myasthenia Gravis who presents to the ED with a one day history of shortness of breath.   Patient was in his usual state of health yesterday. He went to [facility] where he was seen by his neurologist. On his way back, he stopped and had a chick fillet sandwich, which is not a usual choice of meal for him. He is usually very careful with his salt intake. He was able to ambulate at baseline yesterday and was last seen in his usual state of healthy by his daughter around 5 pm when she dropped him off. Family also reports that he did a couple of hours of yard work, something that is outside of the ordinary for him.   [name] was interviewed in the presence of his son [name] and daughter-in-law [name]. He denied having chest pain, chest tightness, chest pressure, or wheezing. He confirmed the breathing difficult was sudden in onset. He has not had any increased weakness, difficulty swallowing, increased drooling, or other bulbar symptoms given his history of myasthenia. They report some runny nose recently, but otherwise no signs or symptoms of an URTI. No fevers or chills were reported. Patient has chronic stasis dermatitis for which he is followed as an outpatient with documented improvement.   Patient was seen yesterday at [facility] by neurology for his myasthenia gravis. The note is not complete and I am unable to review it. Review of prior neurology notes shows that he was diagnosed with  lambert-eaton syndrome in 1977, and myasthenia gravis in 2001. He is AChR antibody positive and is maintained on 1000 mg bid of mycophenolate and 30 mg of Pyridostigmine every 6 hours. He was also receiving injections of Efgartigimod alfa for the management of his Myasthenia Gravis.   He also follows with Dr. Dionne Bucy from cardiology for his decompensated heart failure. He has a history of HFrEF, aortic stenosis, tricuspid regurgitation, mitral regurgitation, CAD, and Hypertension. He is on 40 mg of furosemide daily.   In the ED, he was noted to be hypoxic and hypercapnic with a VBG showing a pH of 7.2 and CO2 of 80. He was started on BiPAP 20/10 with improvement in oxygenation. He was given 40 mg of IV furosemide with improvement.  Pertinent  Medical History  #Myasthenia Gravis (AChR +ve) #Lambert Eaton Syndrome (P/Q antibody positive) #HFrEF #CAD #HTN #AS #MR #TR #BPH  Micro Data:  9/16: SARS-CoV-2 and influenza PCR>> negative 9/16: MRSA PCR>> negative  Antimicrobials:  N/A  Significant Hospital Events: Including procedures, antibiotic start and stop dates in addition to other pertinent events   06/14/2022: started on BiPAP, admitted by PCCM. 06/15/2022: Cardiology consulted for NSTEMI, no cath, medically treating NSTEMI.  Neurology consulted for PMHx of Myasthenia gravis. 06/16/2022: Improved, weaned off BiPAP. Decrease Lasix to 40 mg IV daily. Resume home Cellcept. Transfer to Progressive Cardiac when bed available  Interim History / Subjective:  -No significant events noted overnight -Afebrile, he medically stable (blood pressure on the softer side  with systolics in the low 035W) -Urine output 2.3 L for last 24 hours (net -2.7 L) -Creatinine improved to 0.89 from 1.11, however BUN to creatinine increased to 28 from 20 (BUN: Creatinine ratio 31.4) ~will de-escalate diuretics to 40 mg IV Lasix daily -Infectious markers remain negative, no evidence of pneumonia or UTI ~discussed with  Dr. Mortimer Fries and pharmacy will reinitiate home CellCept -Patient sitting up in recliner on 2 L nasal cannula, no acute distress, eating breakfast, denies all complaints ~will transfer to progressive cardiac unit once bed available -To remain on heparin drip for 48 hours (stops at 2300 tonight) -Will have PT and OT evaluate patient  Objective   Blood pressure (!) 101/54, pulse 62, temperature 98.1 F (36.7 C), temperature source Axillary, resp. rate 14, height '5\' 10"'$  (1.778 m), weight 84.6 kg, SpO2 97 %.        Intake/Output Summary (Last 24 hours) at 06/16/2022 0729 Last data filed at 06/16/2022 0500 Gross per 24 hour  Intake 1310.81 ml  Output 2325 ml  Net -1014.19 ml   Filed Weights   06/14/22 1812  Weight: 84.6 kg    Examination: General: Acute on chronically ill-appearing male, sitting in recliner on 2 L nasal cannula, no acute distress HENT: Atraumatic, normocephalic, neck supple, no JVD Lungs: Lungs clear with bibasilar coarse crackles, even, nonlabored, normal effort Cardiovascular: Regular rate and rhythm, S1-S2, no murmurs, rubs, gallops Abdomen: Soft, nontender, nondistended, no guarding or rebound tenderness, bowel sounds positive x4 Extremities: Generalized weakness, no deformities, bilateral lower extremities with Unna boots in place Neuro: Awake and alert (hard of hearing) oriented x3, moves all extremities to command, no focal deficits, speech clear GU: Foley catheter in place draining yellow urine  Resolved Hospital Problem list     Assessment & Plan:   #Acute Hypoxic and Hypercapnic Respiratory Failure in the setting of pulmonary edema -Supplemental O2 as needed to maintain O2 sats >90% -BiPAP as needed, currently weaned off ~ recommend nocturnal BiPAP -Follow intermittent Chest X-ray & ABG as needed -Bronchodilators as needed -Diuresis as BP and renal function permits ~de-escalate to 40 mg IV Lasix daily on 9/18 -Pulmonary toilet as able  #Acute Decompensated  Heart Failure with reduced Ejection Fraction #Valvulopathy (AS, MR, TR) #NSTEMI -Continuous cardiac monitoring -Maintain MAP >65 -Vasopressors as needed to maintain MAP goal ~not requiring -HS Troponin peaked at 8893 -Continue heparin drip for 48 hours (to end at 2300 on 9/18) -Continue aspirin and statin -Cardiology following, appreciate input ~no acute plans for cardiac cath at this time -Repeat echocardiogram pending  #Mild Hypokalemia PMHx: BPH -Monitor I&O's / urinary output -Follow BMP -Ensure adequate renal perfusion -Avoid nephrotoxic agents as able -Replace electrolytes as indicated -Pharmacy following for assistance with electrolyte replacement -Resume home Finasteride and Tamsulosin -Will continue Foley until Heparin drip d/c as pt may require in & out catheterization given BPH history, plan to remove foley tomorrow 9/19  #Myasthenia Gravis #Lambert Beulah Gandy Myasthenic Syndrome His myasthenia gravis is well controlled on outpatient Pyridostigmine (30 mg q 6 hours) and Efgartigimod alfa outpatient injections. He is also on Mycophenolate 1000 mg bid. He did test positive for P/Q type calcium channel antibodies in 2014 consistent with [name] Myasthenic Syndrome. He also had positive antibodies for AChR antibodies in 2014. He was admitted in December of 2022 where he was managed for a similar chief complaint. At that time, he was also given a course of IVIG for concern of myasthenic crisis (but not steroids). Shortness of breath is  sudden onset and not consistent with a myasthenic crisis.  -Continue Pyridostigmine 30 mg every 6 hours  -Discussed with Dr. Mortimer Fries, restart Mycophenolate today 9/18 -Hold Efgartigimod alfa (last infusion was 05/09/2022) -Neurology was consulted, has signed off  ID No signs of active infection. Patient's procalcitonin was <0.1 on presentation supporting no active infection. Patient is on Mycophenolate and is at increased risk for opportunistic  infections, will monitor fever curve and hold antibiotics. Of note, his total IgG levels monitored by his outpatient providers and are low.   Best Practice (right click and "Reselect all SmartList Selections" daily)   Diet/type: Regular consistency (see orders) DVT prophylaxis: systemic heparin GI prophylaxis: PPI Lines: N/A Foley:  Yes, and it is still needed Code Status:  full code Last date of multidisciplinary goals of care discussion [06/16/2022]   9/18: Pt and his daughter updated at bedside.  All questions answered.  Labs   CBC: Recent Labs  Lab 06/14/22 1526 06/15/22 0302 06/16/22 0207  WBC 9.2 8.1 7.3  HGB 12.9* 12.3* 11.8*  HCT 42.2 40.6 38.2*  MCV 93.0 95.1 93.9  PLT 165 143* 131*    Basic Metabolic Panel: Recent Labs  Lab 06/14/22 1526 06/15/22 0302 06/16/22 0207  NA 141 140 136  K 3.9 3.8 3.4*  CL 100 99 94*  CO2 30 33* 33*  GLUCOSE 132* 121* 110*  BUN 19 20 28*  CREATININE 1.06 1.11 0.89  CALCIUM 9.2 8.6* 8.1*  MG 2.1 2.1 1.9  PHOS 2.8 4.7* 2.9   GFR: Estimated Creatinine Clearance: 53.5 mL/min (by C-G formula based on SCr of 0.89 mg/dL). Recent Labs  Lab 06/14/22 1526 06/14/22 1859 06/14/22 1944 06/15/22 0302 06/16/22 0207  PROCALCITON <0.10  --   --  0.31 0.24  WBC 9.2  --   --  8.1 7.3  LATICACIDVEN  --  1.9 1.5  --   --     Liver Function Tests: Recent Labs  Lab 06/14/22 1526  AST 28  ALT 19  ALKPHOS 87  BILITOT 1.6*  PROT 7.0  ALBUMIN 4.4   No results for input(s): "LIPASE", "AMYLASE" in the last 168 hours. No results for input(s): "AMMONIA" in the last 168 hours.  ABG    Component Value Date/Time   PHART 7.45 06/14/2022 1930   PCO2ART 52 (H) 06/14/2022 1930   PO2ART 125 (H) 06/14/2022 1930   HCO3 40.2 (H) 06/15/2022 1419   O2SAT 89.5 06/15/2022 1419     Coagulation Profile: Recent Labs  Lab 06/14/22 1526  INR 1.2    Cardiac Enzymes: No results for input(s): "CKTOTAL", "CKMB", "CKMBINDEX", "TROPONINI" in  the last 168 hours.  HbA1C: Hgb A1c MFr Bld  Date/Time Value Ref Range Status  02/22/2021 12:34 PM 5.9 4.6 - 6.5 % Final    Comment:    Glycemic Control Guidelines for People with Diabetes:Non Diabetic:  <6%Goal of Therapy: <7%Additional Action Suggested:  >8%   11/15/2020 08:50 AM 5.9 4.6 - 6.5 % Final    Comment:    Glycemic Control Guidelines for People with Diabetes:Non Diabetic:  <6%Goal of Therapy: <7%Additional Action Suggested:  >8%     CBG: Recent Labs  Lab 06/14/22 1820 06/15/22 1930  GLUCAP 182* 152*    Review of Systems:   Positives in BOLD: Gen: Denies fever, chills, weight change, fatigue, night sweats HEENT: Denies blurred vision, double vision, hearing loss, tinnitus, sinus congestion, rhinorrhea, sore throat, neck stiffness, dysphagia PULM: Denies shortness of breath, cough, sputum production, hemoptysis, wheezing CV:  Denies chest pain, edema, orthopnea, paroxysmal nocturnal dyspnea, palpitations GI: Denies abdominal pain, nausea, vomiting, diarrhea, hematochezia, melena, constipation, change in bowel habits GU: Denies dysuria, hematuria, polyuria, oliguria, urethral discharge Endocrine: Denies hot or cold intolerance, polyuria, polyphagia or appetite change Derm: Denies rash, dry skin, scaling or peeling skin change Heme: Denies easy bruising, bleeding, bleeding gums Neuro: Denies headache, numbness, weakness, slurred speech, loss of memory or consciousness   Past Medical History:  He,  has a past medical history of Abnormal chest CT (03/28/2017), Allergy, Anemia, Angina pectoris ([place]) (04/02/2011), Aortic aneurysm ([place]) (03/28/2017), Atherosclerosis of both carotid arteries (07/17/2014), Basal cell carcinoma (06/25/2021), Benign lipomatous neoplasm of skin and subcutaneous tissue of left arm (10/04/2013), Bilateral carotid artery stenosis (08/30/2014), Bladder cancer ([place]) (03/05/2013), CAD (coronary artery disease) (06/18/2014), Cancer ([place]) (11/24/2012),  Chicken pox, Chronic prostatitis (09/30/2012), Colon polyp, Congestive heart failure ([place]) (04/20/2014), Dizziness (12/22/2016), 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[place]) (08/30/2012), Eaton-Lambert syndrome ([place]), Elevated prostate specific antigen (PSA) (09/30/2012), Essential hypertension, benign, Gross hematuria (09/30/2012), Herpes zoster (11/28/2011), Hypercholesterolemia, Hypertension, benign (04/02/2011), Hypotension, Moderate mitral insufficiency (05/18/2015), Moderate tricuspid insufficiency (05/18/2015), Myasthenia gravis ([place]), Myasthenia gravis without exacerbation ([place]) (03/31/2011), Shingles (2013), Squamous cell carcinoma of skin (11/22/2015), Squamous cell carcinoma of skin (11/03/2018), and Squamous cell carcinoma of skin (02/02/2019).   Surgical History:   Past Surgical History:  Procedure Laterality Date   [place]     Social History:   reports that he quit smoking about 63 years ago. His smoking use included cigarettes. He has never used smokeless tobacco. He reports current alcohol use. He reports that he does not use drugs.   Family History:  His family history includes Arthritis in an other family member; Lung cancer in his brother and sister; Prostate cancer in his brother. There is no history of Colon cancer.   Allergies Allergies  Allergen Reactions   Beta Adrenergic Blockers Other (See Comments)    Beta Blocker will precipitate acute weakness in Lambert-Eaton Syndrome or Myasthenia Gravis   Calcium Channel Blockers Other (See Comments)    Calcium Channel Blocker- will precipitate acute weakness in Lambert-Eaton Syndrome.   Ciprofloxacin Other (See Comments)    Last resort due to Myasthia Gravis   Sulfa Antibiotics Rash     Home Medications  Prior to Admission medications   Medication Sig Start Date End Date Taking? Authorizing Provider  aspirin 81 MG tablet Take 81 mg by mouth  daily.   Yes [provider]  finasteride (PROSCAR) 5 MG tablet Take 5 mg by mouth daily.   Yes [provider]  furosemide (LASIX) 40 MG tablet Take 1 tablet (40 mg total) by mouth daily. Patient taking differently: Take 40 mg by mouth daily. 04/03/20 06/14/22 Yes Hackney, Otila Kluver A, FNP  mycophenolate (CELLCEPT) 250 MG capsule Take 1,000 mg by mouth 2 (two) times daily.    Yes [provider]  pravastatin (PRAVACHOL) 20 MG tablet Take 1 tablet (20 mg total) by mouth daily. 07/27/14  Yes [name], MD  pyridostigmine (MESTINON) 60 MG tablet Take 30 mg by mouth 6 (six) times daily.    Yes [provider]  tamsulosin (FLOMAX) 0.4 MG CAPS Take 0.4 mg by mouth daily.   Yes [provider]  Acetaminophen (ARTHRITIS PAIN PO) Take 650 mg by mouth.    [provider]  hydroxypropyl methylcellulose / hypromellose (ISOPTO TEARS / GONIOVISC) 2.5 % ophthalmic solution  Place 1 drop into both eyes daily.    [provider]  prednisoLONE acetate (PRED FORTE) 1 % ophthalmic suspension Place 1 drop into the right eye daily. Per patient. 01/15/20   Enzo Bi, MD  tacrolimus (PROTOPIC) 0.1 % ointment Apply topically 2 (two) times daily. 07/29/21   Ralene Bathe, MD     Critical care time: 40 minutes     Darel Hong, AGACNP-BC [facility] Prefer epic messenger for cross cover needs If after hours, please call E-link

## 2022-06-16 NOTE — Progress Notes (Addendum)
[place] for Heparin  Indication: chest pain/ACS  Allergies  Allergen Reactions   Beta Adrenergic Blockers Other (See Comments)    Beta Blocker will precipitate acute weakness in Lambert-Eaton Syndrome or Myasthenia Gravis   Calcium Channel Blockers Other (See Comments)    Calcium Channel Blocker- will precipitate acute weakness in Lambert-Eaton Syndrome.   Ciprofloxacin Other (See Comments)    Last resort due to Myasthia Gravis   Sulfa Antibiotics Rash    Patient Measurements: Height: '5\' 10"'$  (177.8 cm) Weight: 84.6 kg (186 lb 8.2 oz) IBW/kg (Calculated) : 73 Heparin Dosing Weight: 84.6 kg   Vital Signs: Temp: 98.1 F (36.7 C) (09/18 0800) Temp Source: Oral (09/18 0800) BP: 103/53 (09/18 0800) Pulse Rate: 63 (09/18 0800)  Labs: Recent Labs    06/14/22 1526 06/14/22 1655 06/15/22 0302 06/15/22 0459 06/15/22 0752 06/15/22 0752 06/15/22 1224 06/15/22 1555 06/16/22 0207 06/16/22 1000  HGB 12.9*  --  12.3*  --   --   --   --   --  11.8*  --   HCT 42.2  --  40.6  --   --   --   --   --  38.2*  --   PLT 165  --  143*  --   --   --   --   --  131*  --   APTT 30  --   --   --   --   --   --   --   --   --   LABPROT 14.7  --   --   --   --   --   --   --   --   --   INR 1.2  --   --   --   --   --   --   --   --   --   HEPARINUNFRC  --   --   --   --  0.34   < >  --  0.19* 0.40 0.45  CREATININE 1.06  --  1.11  --   --   --   --   --  0.89  --   TROPONINIHS 25*   < > 6,814* 7,420* 8,893*  --  8,437*  --   --   --    < > = values in this interval not displayed.     Estimated Creatinine Clearance: 53.5 mL/min (by C-G formula based on SCr of 0.89 mg/dL).   Medical History: Past Medical History:  Diagnosis Date   Abnormal chest CT 03/28/2017   Allergy    Anemia    Angina pectoris ([place]) 04/02/2011   Aortic aneurysm ([place]) 03/28/2017   Saw Dr Genevive Bi 04/16/17 - recommended f/u CT in 3 months.     Atherosclerosis of both carotid  arteries 07/17/2014   Basal cell carcinoma 06/25/2021   Left ant neck. Nodulocystic pattern, close to margin   Benign lipomatous neoplasm of skin and subcutaneous tissue of left arm 10/04/2013   Bilateral carotid artery stenosis 08/30/2014   Overview:  Less than 50% 2015   Bladder cancer ([place]) 03/05/2013   CAD (coronary artery disease) 06/18/2014   Cancer ([place]) 11/24/2012   bladder. BCG treatments by Dr Jacqlyn Larsen.   Chicken pox    Chronic prostatitis 09/30/2012   Colon polyp    Congestive heart failure ([place]) 04/20/2014   Overview:  Overview:  With anterior mi and moderate lv dyfunction ef 35%  Dizziness 12/22/2016   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[place]) 08/30/2012   Eaton-Lambert syndrome ([facility])    Elevated prostate specific antigen (PSA) 09/30/2012   Essential hypertension, benign    Gross hematuria 09/30/2012   Herpes zoster 11/28/2011   Overview:  with ocular involvement OD   Hypercholesterolemia    Hypertension, benign 04/02/2011   Hypotension    Moderate mitral insufficiency 05/18/2015   Moderate tricuspid insufficiency 05/18/2015   Myasthenia gravis ([place])    Myasthenia gravis without exacerbation ([place]) 03/31/2011   Shingles 2013   Squamous cell carcinoma of skin 11/22/2015   Left cheek. WD SCC. Re-shaved 01/14/2016. Excised 02/05/2016, margins free.   Squamous cell carcinoma of skin 11/03/2018   Right cheek ant. to sideburn. Poorly differentiated.   Squamous cell carcinoma of skin 02/02/2019   Right mid dorsum forearm. WD SCC. EDC.    Medications:  Medications Prior to Admission  Medication Sig Dispense Refill Last Dose   aspirin 81 MG tablet Take 81 mg by mouth daily.   06/13/2022   finasteride (PROSCAR) 5 MG tablet Take 5 mg by mouth daily.   06/13/2022   furosemide (LASIX) 40 MG tablet Take 1 tablet (40 mg total) by mouth daily. (Patient taking differently: Take 40 mg by mouth daily.) 90 tablet 3 06/13/2022   mycophenolate (CELLCEPT) 250 MG capsule Take 1,000 mg by mouth  2 (two) times daily.    06/13/2022   pravastatin (PRAVACHOL) 20 MG tablet Take 1 tablet (20 mg total) by mouth daily. 90 tablet 1 06/13/2022   pyridostigmine (MESTINON) 60 MG tablet Take 30 mg by mouth 6 (six) times daily.    06/13/2022   tamsulosin (FLOMAX) 0.4 MG CAPS Take 0.4 mg by mouth daily.   06/13/2022   Acetaminophen (ARTHRITIS PAIN PO) Take 650 mg by mouth.      hydroxypropyl methylcellulose / hypromellose (ISOPTO TEARS / GONIOVISC) 2.5 % ophthalmic solution Place 1 drop into both eyes daily.      prednisoLONE acetate (PRED FORTE) 1 % ophthalmic suspension Place 1 drop into the right eye daily. Per patient. 5 mL 0    tacrolimus (PROTOPIC) 0.1 % ointment Apply topically 2 (two) times daily. 100 g 2     Assessment: Pharmacy consulted to dose heparin in this 88 year old male admitted with respiratory failure, now with NSTEMI.  No prior anticoag noted.  Labs: Trop pk'd [id number] on 9/17; Hgb stable/low 12.9>11.8; Plts 165>131  Date Time  HL Rate/Comment 9/17 0752 HL 0.34, thera x1; 1150 un/hr 9/17 1555 HL 0.19 subthera; 1150 >1450 un/hr 9/18 0207 HL 0.40, therapeutic x1 1450 un/hr 9/18 1000 HL 0.45, therapeutic x2 1450 un/hr  Goal of Therapy:  Heparin level 0.3-0.7 units/ml Monitor platelets by anticoagulation protocol: Yes   Plan:  9/18:  HL @ 1000 = 0.45, therapeutic x2 Planning 48hrs will complete '@2300'$  tonight. Will continue pt on current rate, no need for further levels unless gtt is continued beyond 48hrs (appropriate for daily monitoring if so).  Lorna Dibble Clinical Pharmacist  06/16/2022 10:51 AM

## 2022-06-16 NOTE — Evaluation (Addendum)
Physical Therapy Evaluation Patient Details Name: [name] MRN: [id number] DOB: 04-16-1926 Today's Date: 06/16/2022  History of Present Illness  Patient is a 88 year old male with NSTEMI,  acute on chronic systolic congestive heart failure, CAD. History of myasthenia gravis, [name].   Clinical Impression  Patient is agreeable to PT evaluation. He reports being independent with mobility at baseline, drives, and lives alone. His supportive family was at the bedside.   Patient in the recliner chair on arrival to the room. The patient required Min guard with occasional safety cues with transfers. Patient ambulated in hallway with rolling walker and Min guard assistance with occasional cues to decrease cadence for safety. Recommended to use rolling walker for now due to mild increased unsteadiness with short distance ambulation without device. No shortness of breath noted and no pain with mobility efforts. Recommend to continue PT while in the hospital to maximize independence in preparation for discharge home. Consider HHPT initially as patient lives alone with transition to cardiac rehab when appropriate.      Recommendations for follow up therapy are one component of a multi-disciplinary discharge planning process, led by the attending physician.  Recommendations may be updated based on patient status, additional functional criteria and insurance authorization.  Follow Up Recommendations Home health PT      Assistance Recommended at Discharge PRN  Patient can return home with the following  Help with stairs or ramp for entrance;Assist for transportation;Assistance with cooking/housework    Equipment Recommendations None recommended by PT  Recommendations for Other Services       Functional Status Assessment Patient has had a recent decline in their functional status and demonstrates the ability to make significant improvements in function in a reasonable and  predictable amount of time.     Precautions / Restrictions Precautions Precautions: Fall Restrictions Weight Bearing Restrictions: No      Mobility  Bed Mobility               General bed mobility comments: not addressed as patient sitting up on arrival and post session    Transfers Overall transfer level: Needs assistance Equipment used: None Transfers: Sit to/from Stand Sit to Stand: Min guard           General transfer comment: safety cues with sitting required.    Ambulation/Gait Ambulation/Gait assistance: Min guard Gait Distance (Feet): 100 Feet Assistive device: Rolling walker (2 wheels) Gait Pattern/deviations: Step-through pattern Gait velocity: fast to normal     General Gait Details: intermittent fast cadence with ambulation requiring cues for safety. encouraged patient to use the rolling walker for safety at this tim as short distance ambulation with no device was mildly unsteady. no shortness of breath is noted with activity and no pain reported. vitals stable throughout  Stairs            Wheelchair Mobility    Modified Rankin (Stroke Patients Only)       Balance Overall balance assessment: Needs assistance Sitting-balance support: Feet supported Sitting balance-Leahy Scale: Good     Standing balance support: No upper extremity supported Standing balance-Leahy Scale: Fair Standing balance comment: no external support required                             Pertinent Vitals/Pain Pain Assessment Pain Assessment: No/denies pain    Home Living Family/patient expects to be discharged to:: Private residence Living Arrangements: Alone Available Help at Discharge:  Family;Available PRN/intermittently Type of Home: House Home Access: Stairs to enter Entrance Stairs-Rails: Right Entrance Stairs-Number of Steps: 3   Home Layout: One level Home Equipment: Conservation officer, nature (2 wheels);Cane - single point;Grab bars -  tub/shower      Prior Function Prior Level of Function : Independent/Modified Independent;Driving             Mobility Comments: independent, drives       Hand Dominance        Extremity/Trunk Assessment   Upper Extremity Assessment Upper Extremity Assessment: Overall WFL for tasks assessed    Lower Extremity Assessment Lower Extremity Assessment: Overall WFL for tasks assessed       Communication   Communication: No difficulties  Cognition Arousal/Alertness: Awake/alert Behavior During Therapy: WFL for tasks assessed/performed Overall Cognitive Status: Within Functional Limits for tasks assessed                                 General Comments: patient is able to follow commands consistently. occasionally impulsive with activity, requiring intermittent safety cues        General Comments      Exercises     Assessment/Plan    PT Assessment Patient needs continued PT services  PT Problem List Decreased activity tolerance;Decreased balance;Decreased mobility;Decreased safety awareness       PT Treatment Interventions DME instruction;Gait training;Stair training;Functional mobility training;Therapeutic activities;Therapeutic exercise;Balance training;Neuromuscular re-education;Cognitive remediation;Patient/family education    PT Goals (Current goals can be found in the Care Plan section)  Acute Rehab PT Goals Patient Stated Goal: to go home PT Goal Formulation: With patient Time For Goal Achievement: 06/30/22 Potential to Achieve Goals: Good    Frequency Min 2X/week     Co-evaluation PT/OT/SLP Co-Evaluation/Treatment: Yes Reason for Co-Treatment: To address functional/ADL transfers PT goals addressed during session: Mobility/safety with mobility         AM-PAC PT "6 Clicks" Mobility  Outcome Measure Help needed turning from your back to your side while in a flat bed without using bedrails?: None Help needed moving from lying on  your back to sitting on the side of a flat bed without using bedrails?: A Little Help needed moving to and from a bed to a chair (including a wheelchair)?: A Little Help needed standing up from a chair using your arms (e.g., wheelchair or bedside chair)?: A Little Help needed to walk in hospital room?: A Little Help needed climbing 3-5 steps with a railing? : A Little 6 Click Score: 19    End of Session Equipment Utilized During Treatment: Oxygen Activity Tolerance: Patient tolerated treatment well Patient left: in chair;with call bell/phone within reach;with family/visitor present (sister and daughter present) Nurse Communication: Mobility status PT Visit Diagnosis: Unsteadiness on feet (R26.81);Muscle weakness (generalized) (M62.81)    Time: 1446-1500 PT Time Calculation (min) (ACUTE ONLY): 14 min   Charges:   PT Evaluation $PT Eval Low Complexity: 1 Low          Minna Merritts, PT, MPT   Percell Locus 06/16/2022, 3:27 PM

## 2022-06-16 NOTE — Progress Notes (Signed)
Patient states he would rather just wear O2 tonight, son is at bedside. RN has been made aware. No distress noted at this time.

## 2022-06-16 NOTE — Consult Note (Addendum)
PHARMACY CONSULT NOTE - FOLLOW UP  Pharmacy Consult for Electrolyte Monitoring and Replacement   Recent Labs: Potassium (mmol/L)  Date Value  06/16/2022 3.4 (L)   Magnesium (mg/dL)  Date Value  06/16/2022 1.9   Calcium (mg/dL)  Date Value  06/16/2022 8.1 (L)   Albumin (g/dL)  Date Value  06/14/2022 4.4   Phosphorus (mg/dL)  Date Value  06/16/2022 2.9   Sodium (mmol/L)  Date Value  06/16/2022 136     Assessment: 88 year old male patient with a history most significant for HFrEF and Myasthenia Gravis who presents to the ED with a one day history of shortness of breath.  Goal of Therapy:  WNL  Medications: On lasix 40 mg IV QD. NS KVO.  Plan:  Scr 1.11>0.89; I/O -2.7L; UOP 1.57m/k/h K 3.8>3.4: pt received 444m KCL x1; no further repletion at this time.  Mg 2.1>1.9: Pt with Myasthenia Gravis, will not replace unless critically low. Avoid IV, prefer cautious PO if needed. 4.7>2.9: CTM trend, currently WNL. F/u with AM labs.   BrLorna DibblePharmD Clinical Pharmacist 06/16/2022 8:33 AM

## 2022-06-16 NOTE — Progress Notes (Signed)
NIF -28  with good effort sitting up in chair

## 2022-06-16 NOTE — Evaluation (Signed)
Occupational Therapy Evaluation Patient Details Name: [name] MRN: [id number] DOB: 1925/10/08 Today's Date: 06/16/2022   History of Present Illness Patient is a 88 year old male with NSTEMI,  acute on chronic systolic congestive heart failure, CAD. History of myasthenia gravis, [name].   Clinical Impression   [name] was seen for OT evaluation this date. Prior to hospital admission, pt was Independent for mobility and ADLs including driving. Pt lives alone, family available as needed. Pt presents to acute OT demonstrating impaired ADL performance and functional mobility 2/2 functional balance deficits. Pt currently requires CGA + RW for simulated toilet t/f - cues for safety, +2 assist for lines mgmt. Fair balance standing no UE support. Independent don/doff B socks seated. Pt near baseline, will sign off. Upon hospital discharge, recommend no OT follow up.    Recommendations for follow up therapy are one component of a multi-disciplinary discharge planning process, led by the attending physician.  Recommendations may be updated based on patient status, additional functional criteria and insurance authorization.   Follow Up Recommendations  No OT follow up    Assistance Recommended at Discharge Set up Supervision/Assistance  Patient can return home with the following Help with stairs or ramp for entrance    Functional Status Assessment  Patient has had a recent decline in their functional status and demonstrates the ability to make significant improvements in function in a reasonable and predictable amount of time.  Equipment Recommendations  None recommended by OT    Recommendations for Other Services       Precautions / Restrictions Precautions Precautions: Fall Restrictions Weight Bearing Restrictions: No      Mobility Bed Mobility               General bed mobility comments: seated pre post session    Transfers Overall transfer level:  Needs assistance Equipment used: None Transfers: Sit to/from Stand Sit to Stand: Min guard           General transfer comment: no AD use, minld balance deficits      Balance Overall balance assessment: Needs assistance Sitting-balance support: Feet supported Sitting balance-Leahy Scale: Good     Standing balance support: No upper extremity supported Standing balance-Leahy Scale: Fair Standing balance comment: no external support required                           ADL either performed or assessed with clinical judgement   ADL Overall ADL's : Needs assistance/impaired                                       General ADL Comments: Independent don/doff B socks seated. CGA + RW for simulated toilet t/f - cues for safety, +2 assist for lines mgmt.      Pertinent Vitals/Pain Pain Assessment Pain Assessment: No/denies pain     Hand Dominance     Extremity/Trunk Assessment Upper Extremity Assessment Upper Extremity Assessment: Overall WFL for tasks assessed   Lower Extremity Assessment Lower Extremity Assessment: Overall WFL for tasks assessed       Communication Communication Communication: No difficulties   Cognition Arousal/Alertness: Awake/alert Behavior During Therapy: WFL for tasks assessed/performed Overall Cognitive Status: Within Functional Limits for tasks assessed  General Comments: patient is able to follow commands consistently. occasionally impulsive with activity, requiring intermittent safety cues     General Comments  87% on 2L [place] during mobility            Home Living Family/patient expects to be discharged to:: Private residence Living Arrangements: Alone Available Help at Discharge: Family;Available PRN/intermittently Type of Home: House Home Access: Stairs to enter Entrance Stairs-Number of Steps: 3 Entrance Stairs-Rails: Right Home Layout: One level      Bathroom Shower/Tub: Walk-in shower         Home Equipment: Conservation officer, nature (2 wheels);Cane - single point;Grab bars - tub/shower          Prior Functioning/Environment Prior Level of Function : Independent/Modified Independent;Driving             Mobility Comments: independent, drives          OT Problem List: Impaired balance (sitting and/or standing);Decreased safety awareness         OT Goals(Current goals can be found in the care plan section) Acute Rehab OT Goals Patient Stated Goal: to go home OT Goal Formulation: With patient Time For Goal Achievement: 06/30/22 Potential to Achieve Goals: Good      Co-evaluation PT/OT/SLP Co-Evaluation/Treatment: Yes Reason for Co-Treatment: To address functional/ADL transfers PT goals addressed during session: Mobility/safety with mobility OT goals addressed during session: ADL's and self-care      AM-PAC OT "6 Clicks" Daily Activity     Outcome Measure Help from another person eating meals?: None Help from another person taking care of personal grooming?: A Little Help from another person toileting, which includes using toliet, bedpan, or urinal?: A Little Help from another person bathing (including washing, rinsing, drying)?: None Help from another person to put on and taking off regular upper body clothing?: None Help from another person to put on and taking off regular lower body clothing?: None 6 Click Score: 22   End of Session    Activity Tolerance: Patient tolerated treatment well Patient left: in chair;with chair alarm set;with family/visitor present  OT Visit Diagnosis: Other abnormalities of gait and mobility (R26.89);Muscle weakness (generalized) (M62.81)                Time: 0174-9449 OT Time Calculation (min): 21 min Charges:  OT General Charges $OT Visit: 1 Visit OT Evaluation $OT Eval Moderate Complexity: 1 Mod  Dessie Coma, M.S. OTR/L  06/16/22, 4:35 PM  ascom [phone]

## 2022-06-16 NOTE — Progress Notes (Signed)
[facility] for Heparin  Indication: chest pain/ACS  Allergies  Allergen Reactions   Beta Adrenergic Blockers Other (See Comments)    Beta Blocker will precipitate acute weakness in Lambert-Eaton Syndrome or Myasthenia Gravis   Calcium Channel Blockers Other (See Comments)    Calcium Channel Blocker- will precipitate acute weakness in Lambert-Eaton Syndrome.   Ciprofloxacin Other (See Comments)    Last resort due to Myasthia Gravis   Sulfa Antibiotics Rash    Patient Measurements: Height: '5\' 10"'$  (177.8 cm) Weight: 84.6 kg (186 lb 8.2 oz) IBW/kg (Calculated) : 73 Heparin Dosing Weight: 84.6 kg   Vital Signs: Temp: 97.8 F (36.6 C) (09/18 0000) Temp Source: Axillary (09/18 0000) BP: 105/52 (09/18 0100) Pulse Rate: 58 (09/18 0100)  Labs: Recent Labs    06/14/22 1526 06/14/22 1655 06/15/22 0302 06/15/22 0459 06/15/22 0752 06/15/22 1224 06/15/22 1555 06/16/22 0207  HGB 12.9*  --  12.3*  --   --   --   --  11.8*  HCT 42.2  --  40.6  --   --   --   --  38.2*  PLT 165  --  143*  --   --   --   --  131*  APTT 30  --   --   --   --   --   --   --   LABPROT 14.7  --   --   --   --   --   --   --   INR 1.2  --   --   --   --   --   --   --   HEPARINUNFRC  --   --   --   --  0.34  --  0.19* 0.40  CREATININE 1.06  --  1.11  --   --   --   --   --   TROPONINIHS 25*   < > 6,814* 7,420* 8,893* 8,437*  --   --    < > = values in this interval not displayed.     Estimated Creatinine Clearance: 42.9 mL/min (by C-G formula based on SCr of 1.11 mg/dL).   Medical History: Past Medical History:  Diagnosis Date   Abnormal chest CT 03/28/2017   Allergy    Anemia    Angina pectoris ([place]) 04/02/2011   Aortic aneurysm ([place]) 03/28/2017   Saw Dr Genevive Bi 04/16/17 - recommended f/u CT in 3 months.     Atherosclerosis of both carotid arteries 07/17/2014   Basal cell carcinoma 06/25/2021   Left ant neck. Nodulocystic pattern, close to margin   Benign  lipomatous neoplasm of skin and subcutaneous tissue of left arm 10/04/2013   Bilateral carotid artery stenosis 08/30/2014   Overview:  Less than 50% 2015   Bladder cancer ([place]) 03/05/2013   CAD (coronary artery disease) 06/18/2014   Cancer ([place]) 11/24/2012   bladder. BCG treatments by Dr Jacqlyn Larsen.   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 WD SCC. Re-shaved 01/14/2016. Excised 02/05/2016, margins free.   Squamous cell carcinoma of skin 11/03/2018   Right cheek ant. to sideburn. Poorly differentiated.   Squamous cell carcinoma of skin 02/02/2019   Right mid dorsum forearm. WD SCC. EDC.    Medications:  Medications Prior to Admission  Medication Sig Dispense Refill Last Dose   aspirin 81 MG tablet Take 81 mg by mouth daily.   06/13/2022   finasteride (PROSCAR) 5 MG tablet Take 5 mg by mouth daily.   06/13/2022   furosemide (LASIX) 40 MG tablet Take 1 tablet (40 mg total) by mouth daily. (Patient taking differently: Take 40 mg by mouth daily.) 90 tablet 3 06/13/2022   mycophenolate (CELLCEPT) 250 MG capsule Take 1,000 mg by mouth 2 (two) times daily.    06/13/2022   pravastatin (PRAVACHOL) 20 MG tablet Take 1 tablet (20 mg total) by mouth daily. 90  tablet 1 06/13/2022   pyridostigmine (MESTINON) 60 MG tablet Take 30 mg by mouth 6 (six) times daily.    06/13/2022   tamsulosin (FLOMAX) 0.4 MG CAPS Take 0.4 mg by mouth daily.   06/13/2022   Acetaminophen (ARTHRITIS PAIN PO) Take 650 mg by mouth.      hydroxypropyl methylcellulose / hypromellose (ISOPTO TEARS / GONIOVISC) 2.5 % ophthalmic solution Place 1 drop into both eyes daily.      prednisoLONE acetate (PRED FORTE) 1 % ophthalmic suspension Place 1 drop into the right eye daily. Per patient. 5 mL 0    tacrolimus (PROTOPIC) 0.1 % ointment Apply topically 2 (two) times daily. 100 g 2     Assessment: Pharmacy consulted to dose heparin in this 88 year old male admitted with respiratory failure, now with NSTEMI.  No prior anticoag noted.  9/17 0752 HL 0.34  9/17 1555 HL 0.19 9/18 0207 HL 0.40, therapeutic X 1   Goal of Therapy:  Heparin level 0.3-0.7 units/ml Monitor platelets by anticoagulation protocol: Yes   Plan:  9/18:  HL @ 0207 = 0.40, therapeutic X 1  Will continue pt on current rate and recheck HL on 9/18 @ 1000.   Shaw Dobek D Clinical Pharmacist  06/16/2022 2:43 AM

## 2022-06-16 NOTE — Progress Notes (Signed)
[facility] Cardiology  CARDIOLOGY CONSULT NOTE  Patient ID: [name] MRN: [id number] DOB/AGE: Feb 09, 1926 88 y.o.  Admit date: 06/14/2022 Referring Physician [place] Primary Physician [facility] Primary Cardiologist Nehemiah Massed Reason for Consultation NSTEMI  HPI: 88 year old gentleman referred for evaluation of NSTEMI and congestive heart failure.  Patient has a history of known coronary artery disease by cardiac catheterization 04/06/2014 CTO LAD, moderate to severe disease in OM.  The patient has chronic systolic congestive heart failure and mild aortic stenosis by echo in January 2023.  Patient also has a history of myasthenia gravis followed at [facility].  The patient presents with shortness of breath, chest x-ray revealing pulmonary edema, BNP 755, consistent with acute on chronic systolic congestive heart failure, HFrEF with EF 30-35% by echocardiogram 09/30/2021, followed by Dr. Nehemiah Massed.  Admission labs also noted for elevated troponin (594, 1709, 3866, 4019, 6814, 7420), in the absence of chest pain, with ECG revealing sinus rhythm at 87 bpm, with nonspecific intraventricular conduction delay, with inferolateral T wave inversions, which appears similar to prior ECG from 09/29/2021, consistent with NSTEMI.  The patient continues to deny chest pain.  The patient reports shortness of breath has improved after initial diuresis.  Interval History: -Feels much better today, seen using incentive spirometer and sitting in recliner.  Remains on supplemental oxygen. -No chest pain, palpitations, dizziness - troponin peaked at 8893 on 9/17 -Repeat echo pending  Review of systems complete and found to be negative unless listed above     Past Medical History:  Diagnosis Date   Abnormal chest CT 03/28/2017   Allergy    Anemia    Angina pectoris ([place]) 04/02/2011   Aortic aneurysm ([place]) 03/28/2017   Saw Dr Genevive Bi 04/16/17 - recommended f/u CT in 3 months.     Atherosclerosis of both carotid arteries 07/17/2014    Basal cell carcinoma 06/25/2021   Left ant neck. Nodulocystic pattern, close to margin   Benign lipomatous neoplasm of skin and subcutaneous tissue of left arm 10/04/2013   Bilateral carotid artery stenosis 08/30/2014   Overview:  Less than 50% 2015   Bladder cancer ([place]) 03/05/2013   CAD (coronary artery disease) 06/18/2014   Cancer ([place]) 11/24/2012   bladder. BCG treatments by Dr Jacqlyn Larsen.   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 WD SCC. Re-shaved 01/14/2016. Excised 02/05/2016, margins free.   Squamous cell carcinoma of skin 11/03/2018   Right cheek ant. to sideburn. Poorly differentiated.   Squamous cell carcinoma of skin 02/02/2019   Right mid dorsum forearm. WD SCC. EDC.    Past Surgical History:  Procedure Laterality Date   [place]  Medications Prior to Admission  Medication Sig Dispense Refill Last Dose   aspirin 81 MG tablet Take 81 mg by mouth daily.   06/13/2022   finasteride (PROSCAR) 5 MG tablet Take 5 mg by mouth daily.   06/13/2022   furosemide (LASIX) 40 MG tablet Take 1 tablet (40 mg total) by mouth daily. (Patient taking differently:  Take 40 mg by mouth daily.) 90 tablet 3 06/13/2022   mycophenolate (CELLCEPT) 250 MG capsule Take 1,000 mg by mouth 2 (two) times daily.    06/13/2022   pravastatin (PRAVACHOL) 20 MG tablet Take 1 tablet (20 mg total) by mouth daily. 90 tablet 1 06/13/2022   pyridostigmine (MESTINON) 60 MG tablet Take 30 mg by mouth 6 (six) times daily.    06/13/2022   tamsulosin (FLOMAX) 0.4 MG CAPS Take 0.4 mg by mouth daily.   06/13/2022   Acetaminophen (ARTHRITIS PAIN PO) Take 650 mg by mouth.      hydroxypropyl methylcellulose / hypromellose (ISOPTO TEARS / GONIOVISC) 2.5 % ophthalmic solution Place 1 drop into both eyes daily.      prednisoLONE acetate (PRED FORTE) 1 % ophthalmic suspension Place 1 drop into the right eye daily. Per patient. 5 mL 0    tacrolimus (PROTOPIC) 0.1 % ointment Apply topically 2 (two) times daily. 100 g 2    Social History   Socioeconomic History   Marital status: Widowed    Spouse name: Not on file   Number of children: Not on file   Years of education: Not on file   Highest education level: Not on file  Occupational History   Not on file  Tobacco Use   Smoking status: Former    Types: Cigarettes    Quit date: 09/29/1958    Years since quitting: 63.7   Smokeless tobacco: Never  Vaping Use   Vaping Use: Never used  Substance and Sexual Activity   Alcohol use: Yes    Alcohol/week: 0.0 standard drinks of alcohol    Comment: occasionally   Drug use: No   Sexual activity: Never  Other Topics Concern   Not on file  Social History Narrative   Pt is married with 2 children - 1 son, 1 daughter. Previously self-employed in [employer] Strain: [place]  (11/19/2021)   Overall Financial Resource Strain (CARDIA)    Difficulty of Paying Living Expenses: Not hard at all  Food Insecurity: No Food Insecurity (11/19/2021)   Hunger Vital Sign    Worried About Running Out of Food in the Last Year: Never true    [place] in the Last Year: Never true  Transportation Needs: No Transportation Needs (11/19/2021)   PRAPARE - Hydrologist (Medical): No    Lack of Transportation (Non-Medical): No  Physical Activity: Not on file  Stress: No Stress Concern Present (11/19/2021)   [place]    Feeling of Stress : Not at all  Social Connections: Unknown (11/19/2021)   Social Connection and Isolation Panel [NHANES]    Frequency of Communication with Friends and Family: More than three times a week    Frequency of Social Gatherings with Friends and Family: More than three times a week    Attends Religious Services: More than 4 times per year    Active Member of [employer] or Organizations: Not on file    Attends Archivist  Meetings: Not on file    Marital Status: Widowed  Intimate Partner Violence: Not At Risk (11/19/2021)   Humiliation, Afraid, Rape, and Kick questionnaire    Fear of Current or Ex-Partner: No    Emotionally Abused: No    Physically Abused: No    Sexually Abused: No    Family History  Problem Relation Age of Onset   Lung cancer Sister    Prostate cancer Brother    Lung cancer Brother    Arthritis Other        parent   Colon cancer Neg Hx     PHYSICAL EXAM  General: Pleasant elderly Caucasian male, sitting upright in recliner in ICU with his daughter at bedside.   HEENT:  Normocephalic and atraumatic Neck:  No JVD.  Lungs: Normal respiratory effort on oxygen by nasal cannula, bibasilar crackles without appreciable wheezes. Heart: HRRR . Normal S1 and S2 without gallops.  3/6 systolic murmurs best heard at the RUSB.  Abdomen: Nondistended appearing  Msk:  Normal strength and tone for age. Extremities: Bilateral lower extremities wrapped with padded foam and Kerlix dressing up to his knees, so difficult to assess volume status.  No palpable edema on the top of his calf. Neuro: Alert and  oriented X 3. Psych:  Good affect, responds appropriately  Labs:   Lab Results  Component Value Date   WBC 7.3 06/16/2022   HGB 11.8 (L) 06/16/2022   HCT 38.2 (L) 06/16/2022   MCV 93.9 06/16/2022   PLT 131 (L) 06/16/2022    Recent Labs  Lab 06/14/22 1526 06/15/22 0302 06/16/22 0207  NA 141   < > 136  K 3.9   < > 3.4*  CL 100   < > 94*  CO2 30   < > 33*  BUN 19   < > 28*  CREATININE 1.06   < > 0.89  CALCIUM 9.2   < > 8.1*  PROT 7.0  --   --   BILITOT 1.6*  --   --   ALKPHOS 87  --   --   ALT 19  --   --   AST 28  --   --   GLUCOSE 132*   < > 110*   < > = values in this interval not displayed.    Lab Results  Component Value Date   TROPONINI 0.43 [facility]) 12/29/2016     Lab Results  Component Value Date   CHOL 140 02/22/2021   CHOL 159 11/15/2020   CHOL 155 07/16/2020   Lab Results  Component Value Date   HDL 44.70 02/22/2021   HDL 57.20 11/15/2020   HDL 53.80 07/16/2020   Lab Results  Component Value Date   LDLCALC 71 02/22/2021   LDLCALC 87 11/15/2020   LDLCALC 86 07/16/2020   Lab Results  Component Value Date   TRIG 121.0 02/22/2021   TRIG 74.0 11/15/2020   TRIG 77.0 07/16/2020   Lab Results  Component Value Date   CHOLHDL 3 02/22/2021   CHOLHDL 3 11/15/2020   CHOLHDL 3 07/16/2020   Lab Results  Component Value Date   LDLDIRECT 143.5 08/26/2013      Radiology: DG Chest Port 1 View  Result Date: 06/15/2022 CLINICAL DATA:  Heart failure EXAM: PORTABLE CHEST 1 VIEW COMPARISON:  06/14/2022 FINDINGS: 0508 hours. The cardio pericardial silhouette is enlarged. Vascular congestion with diffuse interstitial pulmonary edema noted. Mild left base collapse/consolidation with probable tiny left effusion. Bones are diffusely demineralized. Telemetry leads  overlie the chest. IMPRESSION: 1. Cardiomegaly with pulmonary edema pattern. 2. Mild left base collapse/consolidation with probable tiny left effusion. Electronically Signed   By: Misty Stanley M.D.   On:  06/15/2022 06:32   DG Chest Port 1 View  Result Date: 06/14/2022 CLINICAL DATA:  Shortness of breath. EXAM: PORTABLE CHEST 1 VIEW COMPARISON:  Chest x-ray dated September 29, 2021. FINDINGS: The heart size and mediastinal contours are within normal limits. Pulmonary vascular congestion and mid and lower lung interstitial thickening similar to prior study. Small left pleural effusion. No pneumothorax. No acute osseous abnormality. IMPRESSION: 1. Congestive heart failure, similar to January. Electronically Signed   By: Titus Dubin M.D.   On: 06/14/2022 15:19    EKG: Sinus rhythm 87 bpm, nonspecific intraventricular conduction delay  ECHO: 09/2021  1. Left ventricular ejection fraction, by estimation, is 30 to 35%. The  left ventricle has moderately decreased function. The left ventricle  demonstrates regional wall motion abnormalities (see scoring  diagram/findings for description). Left ventricular   diastolic parameters are consistent with Grade II diastolic dysfunction  (pseudonormalization).   2. Right ventricular systolic function is normal. The right ventricular  size is not well visualized.   3. Left atrial size was severely dilated.   4. The mitral valve is normal in structure. Mild mitral valve  regurgitation. No evidence of mitral stenosis.   5. The aortic valve is calcified. Aortic valve regurgitation is not  visualized. Aortic valve sclerosis/calcification is present, without any  evidence of aortic stenosis.   Comparison(s): No significant change from prior study.   Telemetry: NSR rate 64 occasional PVCs  Data Reviewed by me (LT) 9/18: Critical care note, neurology note, CBC, BMP, troponins, cholesterol panel I's and O's, telemetry  ASSESSMENT AND PLAN:   1.  NSTEMI, with elevated troponin (594, 1709, 3866, 4019, 6814, 7420, 8893, 8437 ), in the absence of chest pain, with nondiagnostic ECG, in the setting of acute on chronic systolic congestive heart failure, HFrEF, on  heparin infusion 2.  Acute on chronic systolic congestive heart failure, HFrEF, EF 30-35% by echo 09/30/2021, clinically improved after IV lasix '40mg'$  x 5 doses 3.  CAD, two-vessel coronary artery disease by cardiac catheterization 04/06/2014, without history of surgical or percutaneous intervention 4.  Myasthenia gravis, [facility] at [facility]  Recommendations  1.  Agree with overall current therapy 2.  Continue diuresis 3.  Carefully monitor renal status 4.  Continue heparin infusion 48 hours (ending 9/18 at 2300)  5.  continue low-dose aspirin 6.  continue pravastatin 8.  BBs contraindicated with history of MG 9   consider addition of low-dose lisinopril and Jardiance, pending clinical course, previously did not tolerate lisinopril due to hypotension per his daughter 54.  Continue conservative medical management and defer cardiac catheterization at this time, in the absence of chest pain, known ischemic cardiomyopathy, and in light of advanced age for which the patient and his daughter are in agreement.  This patient's plan of care was discussed and created with Dr. Nehemiah Massed and he is in agreement.    Signed: Alanson Puls Tang PA-C 06/16/2022, 9:08 AM  The patient has had some shortness of breath and there has been some concerns pulmonary edema so oxygen supplementation has been started.  We have had a long discussion with the family about his non-ST elevation myocardial infarction with no evidence of further chest discomfort.  We have discussed aspirin and Plavix and furosemide but with the lower blood pressure may not be able to  add any medication management beyond that.  In addition to that we will be noninterventional at this time due to concerns of advanced age.  He does have LV systolic dysfunction by previous echocardiogram.  I have personally reviewed EKG, telemetry, chest x-ray, laboratory work, previous echocardiogram and office notes.  The patient has been interviewed and examined. I agree  with assessment and plan above. Serafina Royals MD [facility]

## 2022-06-17 DIAGNOSIS — J9621 Acute and chronic respiratory failure with hypoxia: Secondary | ICD-10-CM | POA: Diagnosis not present

## 2022-06-17 DIAGNOSIS — Z515 Encounter for palliative care: Secondary | ICD-10-CM | POA: Diagnosis not present

## 2022-06-17 DIAGNOSIS — I5043 Acute on chronic combined systolic (congestive) and diastolic (congestive) heart failure: Secondary | ICD-10-CM

## 2022-06-17 DIAGNOSIS — I214 Non-ST elevation (NSTEMI) myocardial infarction: Secondary | ICD-10-CM | POA: Diagnosis not present

## 2022-06-17 DIAGNOSIS — G7000 Myasthenia gravis without (acute) exacerbation: Secondary | ICD-10-CM | POA: Diagnosis not present

## 2022-06-17 DIAGNOSIS — Z789 Other specified health status: Secondary | ICD-10-CM

## 2022-06-17 LAB — CBC
HCT: 37.8 % — ABNORMAL LOW (ref 39.0–52.0)
Hemoglobin: 11.5 g/dL — ABNORMAL LOW (ref 13.0–17.0)
MCH: 28.4 pg (ref 26.0–34.0)
MCHC: 30.4 g/dL (ref 30.0–36.0)
MCV: 93.3 fL (ref 80.0–100.0)
Platelets: 145 10*3/uL — ABNORMAL LOW (ref 150–400)
RBC: 4.05 MIL/uL — ABNORMAL LOW (ref 4.22–5.81)
RDW: 15 % (ref 11.5–15.5)
WBC: 4.7 10*3/uL (ref 4.0–10.5)
nRBC: 0 % (ref 0.0–0.2)

## 2022-06-17 LAB — ECHOCARDIOGRAM COMPLETE
AR max vel: 1.3 cm2
AV Area VTI: 1.28 cm2
AV Area mean vel: 1.22 cm2
AV Mean grad: 9.5 mmHg
AV Peak grad: 16.7 mmHg
Ao pk vel: 2.05 m/s
Area-P 1/2: 4.49 cm2
Calc EF: 32.7 %
Height: 70 in
S' Lateral: 5.2 cm
Single Plane A2C EF: 36.2 %
Single Plane A4C EF: 27.9 %
Weight: 2984.15 oz

## 2022-06-17 LAB — BASIC METABOLIC PANEL
Anion gap: 9 (ref 5–15)
BUN: 27 mg/dL — ABNORMAL HIGH (ref 8–23)
CO2: 34 mmol/L — ABNORMAL HIGH (ref 22–32)
Calcium: 8.5 mg/dL — ABNORMAL LOW (ref 8.9–10.3)
Chloride: 99 mmol/L (ref 98–111)
Creatinine, Ser: 0.99 mg/dL (ref 0.61–1.24)
GFR, Estimated: >60 mL/min (ref >60)
Glucose, Bld: 105 mg/dL — ABNORMAL HIGH (ref 70–99)
Potassium: 3.7 mmol/L (ref 3.5–5.1)
Sodium: 142 mmol/L (ref 135–145)

## 2022-06-17 LAB — MAGNESIUM: Magnesium: 2.1 mg/dL (ref 1.7–2.4)

## 2022-06-17 LAB — PHOSPHORUS: Phosphorus: 3.3 mg/dL (ref 2.5–4.6)

## 2022-06-17 MED ORDER — ENOXAPARIN SODIUM 40 MG/0.4ML IJ SOSY
40.0000 mg | PREFILLED_SYRINGE | Freq: Every day | INTRAMUSCULAR | Status: DC
  Administered 2022-06-17: 40 mg via SUBCUTANEOUS
  Filled 2022-06-17: qty 0.4

## 2022-06-17 MED ORDER — CLOPIDOGREL BISULFATE 75 MG PO TABS
75.0000 mg | ORAL_TABLET | Freq: Every day | ORAL | Status: DC
  Administered 2022-06-17 – 2022-06-18 (×2): 75 mg via ORAL
  Filled 2022-06-17 (×3): qty 1

## 2022-06-17 MED ORDER — POTASSIUM CHLORIDE CRYS ER 20 MEQ PO TBCR
40.0000 meq | EXTENDED_RELEASE_TABLET | Freq: Once | ORAL | Status: AC
  Administered 2022-06-17: 40 meq via ORAL
  Filled 2022-06-17: qty 2

## 2022-06-17 NOTE — Assessment & Plan Note (Signed)
Echocardiogram earlier this year notes ejection fraction 30 to 17% and diastolic dysfunction.  BNP on admission at 756.  Has been getting Lasix and has diuresed over 5 L and is more than -3 L deficient, down 10+ pounds.  Continue Lasix.  Cardiology following.  Renal function remaining stable.  Recheck labs in the morning.

## 2022-06-17 NOTE — Progress Notes (Signed)
[facility] Progress Note  Patient: MAXINE HUYNH    OIN:[id number]  DOA: 06/14/2022    Date of Service: the patient was seen and examined on 06/17/2022  Brief hospital course: 88 year old male with past medical history of systolic heart failure myasthenia gravis who presented to the emergency room on 9/16 with shortness of breath x1 day.  Patient found to be in acute respiratory failure as well as having a non-STEMI started on BiPAP and admitted by critical care service.  Patient seen by cardiology and plans are for medical management including 48 hours of IV heparin with no cardiac catheterization.  Neurology consulted given history of myasthenia gravis.  By 9/18, patient was weaned off of BiPAP and transferred to the hospitalist service.  Assessment and Plan: Assessment and Plan: * Acute on chronic respiratory failure with hypoxia and hypercapnia (HCC) Initially requiring BiPAP and now weaned down to baseline of 2 L.  NSTEMI (non-ST elevated myocardial infarction) ([place]) Medically managing.  No plans for cardiac catheterization intervention.  No chest pain.  Completed 48 hours of IV heparin.  Continued on aspirin and Plavix added.  Continue statin.  Patient comfortable currently.  Palliative care following as well as cardiology.  No beta-blocker due to history of myasthenia gravis.  Previously did not tolerate lisinopril due to hypotension.  Acute on chronic combined systolic and diastolic CHF (congestive heart failure) (HCC) Echocardiogram earlier this year notes ejection fraction 30 to 70% and diastolic dysfunction.  BNP on admission at 756.  Has been getting Lasix and has diuresed over 5 L and is more than -3 L deficient, down 10+ pounds.  Continue Lasix.  Cardiology following.  Renal function remaining stable.  Recheck labs in the morning.  Myasthenia gravis ([place]) Seen by neurology.  Myasthenia feels to be at baseline.  Continue home Mestinon.  Avoiding beta-blockers.  Procalcitonin  on 17th and 18th less than 1 with no rise.  CellCept restarted.  Patient has been afebrile.  Benign prostatic hyperplasia Continued on Flomax and Proscar       Body mass index is 24.99 kg/m.        Consultants: Critical care Neurology Cardiology Palliative care  Procedures: None  Antimicrobials: None  Code Status: Full code   Subjective: Patient doing okay, denies any chest pain, feels like breathing is at baseline  Objective: Vital signs were reviewed and unremarkable. Vitals:   06/17/22 1700 06/17/22 1800  BP: 99/67 (!) 110/54  Pulse: (!) 59 63  Resp: 18 17  Temp:    SpO2: 96% 96%    Intake/Output Summary (Last 24 hours) at 06/17/2022 1908 Last data filed at 06/17/2022 1800 Gross per 24 hour  Intake 630 ml  Output 1450 ml  Net -820 ml   Filed Weights   06/14/22 1812 06/17/22 0340  Weight: 84.6 kg 79 kg   Body mass index is 24.99 kg/m.  Exam:  General: Alert and oriented x2, no acute distress HEENT: Normocephalic, atraumatic, mucous membranes are moist Cardiovascular: Regular rate and rhythm, occasional ectopic beat, 2 out of 6 holosystolic murmur Respiratory: Decreased breath sounds bibasilar Abdomen: Soft, nontender, nondistended, positive bowel sounds Musculoskeletal: No clubbing or cyanosis, 1+ pitting edema Skin: No breaks, tears or lesions Psychiatry: Appropriate, no evidence of psychoses Neurology: No focal deficits  Data Reviewed: Stable electrolytes and kidney function  Disposition:  Status is: Inpatient   Anticipated discharge date: 9/21  Remaining issues to be resolved so that patient can be discharged:  -Further diuresis   Family Communication:  Left message for wife DVT Prophylaxis: enoxaparin (LOVENOX) injection 40 mg Start: 06/17/22 2200    Author: Annita Brod ,MD 06/17/2022 7:08 PM  To reach On-call, see care teams to locate the attending and reach out via [email or web address]. Between 7PM-7AM, please contact  night-coverage If you still have difficulty reaching the attending provider, please page the [facility] (Director on Call) for [facility] on amion for assistance.

## 2022-06-17 NOTE — TOC Initial Note (Signed)
Transition of Care [facility]) - Initial/Assessment Note    Patient Details  Name: [name] MRN: [id number] Date of Birth: 1926-01-07  Transition of Care [facility]) CM/SW Contact:    Shelbie Hutching, RN Phone Number: 06/17/2022, 11:23 AM  Clinical Narrative:                 Patient admitted to the hospital with acute on chronic respiratory failure. Patient has a history of CHF and Myasthenia gravis.  RNCM met with patient at the bedside, introduced self and explained role.  Patient is from home where he is independent and lives alone.  He drives.  He is current with PCP dr. Nicki Reaper and he goes to [facility] for his Myasthenia Gravis.   Patient declines home health at this time.  He has a son that lives close by and checks on him daily.    Expected Discharge Plan: Home/Self Care Barriers to Discharge: Continued Medical Work up   Patient Goals and CMS Choice Patient states their goals for this hospitalization and ongoing recovery are:: Hopes to go home soon      Expected Discharge Plan and Services Expected Discharge Plan: Home/Self Care   Discharge Planning Services: CM Consult   Living arrangements for the past 2 months: Single Family Home                 DME Arranged: N/A DME Agency: NA       HH Arranged: Patient Refused HH          Prior Living Arrangements/Services Living arrangements for the past 2 months: Single Family Home Lives with:: Self Patient language and need for interpreter reviewed:: Yes Do you feel safe going back to the place where you live?: Yes      Need for Family Participation in Patient Care: Yes (Comment) Care giver support system in place?: Yes (comment)   Criminal Activity/Legal Involvement Pertinent to Current Situation/Hospitalization: No - Comment as needed  Activities of Daily Living Home Assistive Devices/Equipment: Cane (specify quad or straight) ADL Screening (condition at time of admission) Patient's cognitive ability adequate to safely  complete daily activities?: Yes Is the patient deaf or have difficulty hearing?: No Does the patient have difficulty seeing, even when wearing glasses/contacts?: No Does the patient have difficulty concentrating, remembering, or making decisions?: No Patient able to express need for assistance with ADLs?: Yes Does the patient have difficulty dressing or bathing?: Yes Independently performs ADLs?: No Does the patient have difficulty walking or climbing stairs?: Yes Weakness of Legs: None Weakness of Arms/Hands: None  Permission Sought/Granted Permission sought to share information with : Case Manager, Family Supports Permission granted to share information with : Yes, Verbal Permission Granted  Share Information with NAME: Juno Bozard     Permission granted to share info w Relationship: son  Permission granted to share info w Contact Information: [phone]  Emotional Assessment Appearance:: Appears stated age Attitude/Demeanor/Rapport: Engaged Affect (typically observed): Accepting Orientation: : Oriented to Self, Oriented to Place, Oriented to  Time, Oriented to Situation Alcohol / Substance Use: Not Applicable Psych Involvement: No (comment)  Admission diagnosis:  Acute respiratory failure with hypoxia (HCC) [J96.01] Myasthenia gravis (HCC) [G70.00] Acute on chronic respiratory failure with hypoxia and hypercapnia (HCC) [J96.21, J96.22] Acute on chronic congestive heart failure, unspecified heart failure type ([place]) [I50.9] Patient Active Problem List   Diagnosis Date Noted   NSTEMI (non-ST elevated myocardial infarction) ([place])    Acute on chronic respiratory failure with hypoxia and hypercapnia (HCC)  06/14/2022   Myasthenia gravis ([place])    Stasis dermatitis 05/24/2022   Acute on chronic combined systolic and diastolic CHF (congestive heart failure) ([place]) 09/28/2021   Benign prostatic hyperplasia 09/28/2021   Rectal bleeding 02/24/2021   Rectal irritation 11/25/2020    Lymphedema 11/19/2020   Pressure injury of right buttock, stage 2 ([place]) 04/23/2020   Lower limb ulcer, calf, left, limited to breakdown of skin ([place]) 12/27/2019   Dysuria 06/30/2019   Hyperglycemia 06/30/2019   LVH (left ventricular hypertrophy) due to hypertensive disease, without heart failure 05/26/2019   Pseudophakia, left eye 11/26/2018   Venous insufficiency of both lower extremities 10/13/2018   Swelling of limb 10/12/2018   Bilateral lower extremity edema 10/10/2018   Absent pulse in lower extremity 10/10/2018   Corneal thinning of right eye 07/17/2018   Eye pain 05/31/2018   Mild aortic valve stenosis 12/03/2017   Epididymoorchitis 09/02/2017   Hydrocele, right 08/11/2017   Aortic aneurysm ([place]) 03/28/2017   Abnormal chest CT 03/28/2017   Acute respiratory failure with hypoxia (HCC) 12/27/2016   Hypotension    Dizziness 12/22/2016   Leg weakness 03/11/2016   Skin lesion 09/11/2015   Moderate mitral insufficiency 05/18/2015   Moderate tricuspid insufficiency 05/18/2015   Essential hypertension 04/25/2015   Rash 03/10/2015   Health care maintenance 03/10/2015   Bilateral carotid artery stenosis 08/30/2014   Atherosclerosis of both carotid arteries 28/78/6767   Chronic systolic CHF (congestive heart failure), NYHA class 3 ([place]) 07/17/2014   Coronary artery disease involving native coronary artery of native heart without angina pectoris 06/18/2014   CHF (congestive heart failure) ([place]) 04/20/2014   Congestive heart failure ([place]) 04/20/2014   SOB (shortness of breath) 03/10/2014   Abnormal EKG 03/10/2014   Mixed hyperlipidemia 03/10/2014   Benign lipomatous neoplasm of skin and subcutaneous tissue of left arm 10/04/2013   Arm skin lesion, left 09/04/2013   History of shingles 09/04/2013   History of bladder cancer 03/05/2013   Chronic prostatitis 09/30/2012   Elevated prostate specific antigen (PSA) 09/30/2012   Family history of malignant neoplasm of prostate  09/30/2012   Gross hematuria 09/30/2012   Incomplete emptying of bladder 09/30/2012   Anemia 08/30/2012   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

## 2022-06-17 NOTE — Consult Note (Signed)
[place] for Electrolyte Monitoring and Replacement   Recent Labs: Potassium (mmol/L)  Date Value  06/17/2022 3.7   Magnesium (mg/dL)  Date Value  06/17/2022 2.1   Calcium (mg/dL)  Date Value  06/17/2022 8.5 (L)   Albumin (g/dL)  Date Value  06/14/2022 4.4   Phosphorus (mg/dL)  Date Value  06/17/2022 3.3   Sodium (mmol/L)  Date Value  06/17/2022 142   Assessment: 88 year old male patient with a history most significant for HFrEF and Myasthenia Gravis who presents to the ED with a one day history of shortness of breath.  Medications: IV Lasix  Goal of Therapy:  Electrolytes within normal limits  Plan:  --K 3.7, will go ahead and give Kcl 40 mEq PO x 1 dose given patient continues on diuresis --Patient care has been transitioned from PCCM to [facility]. Will discontinue electrolyte consult at this time. Defer further ordering of labs and electrolyte replacement to hospitalist  Benita Gutter 06/17/2022 7:39 AM

## 2022-06-17 NOTE — Consult Note (Addendum)
Consultation Note Date: 06/17/2022   Patient Name: [name]  DOB: 1926/01/10  MRN: [id number]  Age / Sex: 88 y.o., male  PCP: Joel Pheasant, MD Referring Physician: Annita Brod, MD  HPI/Patient Profile: 88 y.o. male  with past medical history of Eaton-Lambert syndrome, myasthenia gravis, bladder cancer, chronic systolic CHF with mild aortic stenosis, CAD, and HTN admitted on 06/14/2022 with shortness of breat. Patient is being treated for acute decompensated heart failure requiring BiPAP.  PMT was consulted to clarify patient's CODE STATUS.  Clinical Assessment and Goals of Care: I have reviewed medical records including EPIC notes, labs and imaging, and assessed the patient.  He is resting in the bedside chair in no apparent distress.  No family was present at bedside.  I then spoke with patient's daughter [name] over the phone to discuss diagnosis prognosis, [place], EOL wishes, disposition and options.  I introduced Palliative Medicine as specialized medical care for people living with serious illness. It focuses on providing relief from the symptoms and stress of a serious illness. The goal is to improve quality of life for both the patient and the family.  We discussed a brief life review of the patient.  Patient is 1 of 13 children.  He is an [employer] fast [employer]. 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  In retirement, patient enjoys being active in his garden and yard.  Daughter shares the patient is very positive with a constant glass half full attitude.  As far as functional and nutritional status [name] shares patient is independent with ADLs.  He was even driving to places that are easily accessible for him.  She shares she had a healthy appetite and his mentation was consistently alert and oriented x4.  Advance directives, concepts specific to code status, artificial  feeding and hydration, and rehospitalization were considered and discussed.  [name] shares she is confused as to why people keep asking her about CODE STATUS.  She believes someone discussed DNR status with her father at a time when he did not have full capacity for medical decision-making.    She shares now that he is back to baseline (AAO x4) he is in agreement with full code and accepting of the full scope of treatment.  I shared the patient was sleeping when I attempted to speak with him I would like to schedule a time to speak with me and family in regards to [place].  She requested that I NOT speak with the patient today regarding CODE STATUS as he has already asked several times why people continue to speak with him about it.  However, she was in agreement to allow a PMT provider to confirm CODE STATUS with patient tomorrow, 9/20.  In addition to herself and her brother, she would like the patient to rest today.  Education offered regarding concept specific to human mortality and the limitations of medical interventions to prolong life when the body begins to fail to thrive. Encouraged [name] to consider DNR/DNI status  understanding evidenced based poor outcomes in similar hospitalized patients, as the cause of the arrest is likely associated with chronic/terminal disease rather than a reversible acute cardio-pulmonary event.     Discussed with [name] the importance of continued conversation with family and the medical providers regarding overall plan of care and treatment options, ensuring decisions are within the context of the patient's values and GOCs.  Outpatient palliative services described.  [name] shares that patient is of the mindset that he does not need help until he is physically not able to get up and go.  She believes at that point the patient would likely change his CODE STATUS and be accepting of additional services.  [name] shares she would like to have outpatient palliative services on  board for "whenever that time comes".  Referral placed.  [name] shares that her brother [name] helps in decision-making for her father.  According to [name] has just returned home to catch up on sleep since they are taking turns staying overnight with patient.  She requested that I NOT call to speak with [name] today in hopes of not disturbing his rest.  She plans to give [name] PMT contact information so that he can reach out with any future palliative needs.  Full code remains.   Questions and concerns were addressed. The family was encouraged to call with questions or concerns.   PMT will remain available to patient and family throughout his hospitalization.  Primary Decision Maker PATIENT  Code Status/Advance Care Planning: Full code  Prognosis:   Unable to determine  Discharge Planning: Home with Palliative Services  Primary Diagnoses: Present on Admission:  Acute on chronic respiratory failure with hypoxia and hypercapnia ([name])   Physical Exam Vitals reviewed.  Constitutional:      General: He is not in acute distress.    Appearance: He is well-developed and normal weight. He is not toxic-appearing.  HENT:     Head: Normocephalic.  Cardiovascular:     Rate and Rhythm: Normal rate.  Pulmonary:     Effort: Pulmonary effort is normal.  Musculoskeletal:        General: Normal range of motion.  Skin:    General: Skin is warm and dry.  Neurological:     Mental Status: He is oriented to person, place, and time.  Psychiatric:        Mood and Affect: Mood is not anxious.        Behavior: Behavior is not agitated.     Palliative Assessment/Data: 60%     Thank you for this consult. Palliative medicine will continue to follow and assist holistically.   Time Total: 75 minutes Greater than 50%  of this time was spent counseling and coordinating care related to the above assessment and plan.  Signed by: Jordan Hawks, DNP, FNP-BC Palliative Medicine    Please  contact Palliative Medicine Team phone at [phone] for questions and concerns.  For individual provider: See [name]

## 2022-06-17 NOTE — Assessment & Plan Note (Signed)
Initially requiring BiPAP and now weaned down to baseline of 2 L.

## 2022-06-17 NOTE — Assessment & Plan Note (Addendum)
Seen by neurology.  Myasthenia feels to be at baseline.  Continue home Mestinon.  Avoiding beta-blockers.  Procalcitonin on 17th and 18th less than 1 with no rise.  CellCept restarted.  Patient has been afebrile.

## 2022-06-17 NOTE — Progress Notes (Signed)
NIF -25, good effort noted

## 2022-06-17 NOTE — Progress Notes (Addendum)
[facility] Cardiology  CARDIOLOGY CONSULT NOTE  Patient ID: [name] MRN: [id number] DOB/AGE: [birth year] 86 y.o.  Admit date: 06/14/2022 Referring Physician [place] Primary Physician [facility] Primary Cardiologist Nehemiah Massed Reason for Consultation NSTEMI  HPI: 88 year old gentleman referred for evaluation of NSTEMI and congestive heart failure.  Patient has a history of known coronary artery disease by cardiac catheterization 04/06/2014 CTO LAD, moderate to severe disease in OM.  The patient has chronic systolic congestive heart failure and mild aortic stenosis by echo in January 2023.  Patient also has a history of myasthenia gravis followed at [facility].  The patient presents with shortness of breath, chest x-ray revealing pulmonary edema, BNP 755, consistent with acute on chronic systolic congestive heart failure, HFrEF with EF 30-35% by echocardiogram 09/30/2021, followed by Dr. Nehemiah Massed.  Admission labs also noted for elevated troponin (594, 1709, 3866, 4019, 6814, 7420), in the absence of chest pain, with ECG revealing sinus rhythm at 87 bpm, with nonspecific intraventricular conduction delay, with inferolateral T wave inversions, which appears similar to prior ECG from 09/29/2021, consistent with NSTEMI.  The patient continues to deny chest pain.  The patient reports shortness of breath has improved after initial diuresis.  Interval History: -feels about the same, leg swelling improved somewhat -ambulated with PT with a walker a short distance yesterday and did well -Continues to deny chest pain, still feels somewhat short of breath -Repeat echo pending read  Review of systems complete and found to be negative unless listed above     Past Medical History:  Diagnosis Date   Abnormal chest CT 03/28/2017   Allergy    Anemia    Angina pectoris ([place]) 04/02/2011   Aortic aneurysm ([place]) 03/28/2017   Saw Dr Genevive Bi 04/16/17 - recommended f/u CT in 3 months.     Atherosclerosis of both carotid arteries  07/17/2014   Basal cell carcinoma 06/25/2021   Left ant neck. Nodulocystic pattern, close to margin   Benign lipomatous neoplasm of skin and subcutaneous tissue of left arm 10/04/2013   Bilateral carotid artery stenosis 08/30/2014   Overview:  Less than 50% 2015   Bladder cancer ([place]) 03/05/2013   CAD (coronary artery disease) 06/18/2014   Cancer ([place]) 11/24/2012   bladder. BCG treatments by Dr Jacqlyn Larsen.   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 WD SCC. Re-shaved 01/14/2016. Excised 02/05/2016, margins free.   Squamous cell carcinoma of skin 11/03/2018   Right cheek ant. to sideburn. Poorly differentiated.   Squamous cell carcinoma of skin 02/02/2019   Right mid dorsum forearm. WD SCC. EDC.    Past Surgical History:  Procedure Laterality Date   [place]  1958    Medications Prior to Admission  Medication Sig Dispense Refill Last Dose   aspirin 81 MG tablet Take 81 mg by mouth daily.   06/13/2022   finasteride (PROSCAR) 5 MG tablet Take 5 mg by mouth daily.   06/13/2022   furosemide (LASIX) 40 MG tablet Take 1 tablet (40 mg total) by mouth daily. (Patient taking  differently: Take 40 mg by mouth daily.) 90 tablet 3 06/13/2022   mycophenolate (CELLCEPT) 250 MG capsule Take 1,000 mg by mouth 2 (two) times daily.    06/13/2022   pravastatin (PRAVACHOL) 20 MG tablet Take 1 tablet (20 mg total) by mouth daily. 90 tablet 1 06/13/2022   pyridostigmine (MESTINON) 60 MG tablet Take 30 mg by mouth 6 (six) times daily.    06/13/2022   tamsulosin (FLOMAX) 0.4 MG CAPS Take 0.4 mg by mouth daily.   06/13/2022   Acetaminophen (ARTHRITIS PAIN PO) Take 650 mg by mouth.      hydroxypropyl methylcellulose / hypromellose (ISOPTO TEARS / GONIOVISC) 2.5 % ophthalmic solution Place 1 drop into both eyes daily.      prednisoLONE acetate (PRED FORTE) 1 % ophthalmic suspension Place 1 drop into the right eye daily. Per patient. 5 mL 0    tacrolimus (PROTOPIC) 0.1 % ointment Apply topically 2 (two) times daily. 100 g 2    Social History   Socioeconomic History   Marital status: Widowed    Spouse name: Not on file   Number of children: Not on file   Years of education: Not on file   Highest education level: Not on file  Occupational History   Not on file  Tobacco Use   Smoking status: Former    Types: Cigarettes    Quit date: 09/29/1958    Years since quitting: 63.7   Smokeless tobacco: Never  Vaping Use   Vaping Use: Never used  Substance and Sexual Activity   Alcohol use: Yes    Alcohol/week: 0.0 standard drinks of alcohol    Comment: occasionally   Drug use: No   Sexual activity: Never  Other Topics Concern   Not on file  Social History Narrative   Pt is married with 2 children - 1 son, 1 daughter. Previously self-employed in [place] Strain: [place]  (11/19/2021)   Overall Financial Resource Strain (CARDIA)    Difficulty of Paying Living Expenses: Not hard at all  Food Insecurity: No Food Insecurity (06/16/2022)   Hunger Vital Sign    Worried About Running Out of Food in the Last Year: Never true     Ran Out of Food in the Last Year: Never true  Transportation Needs: No Transportation Needs (06/16/2022)   PRAPARE - Hydrologist (Medical): No    Lack of Transportation (Non-Medical): No  Physical Activity: Not on file  Stress: No Stress Concern Present (11/19/2021)   Stites    Feeling of Stress : Not at all  Social Connections: Unknown (11/19/2021)   Social Connection and Isolation Panel [NHANES]    Frequency of Communication with Friends and Family: More than three times a week    Frequency of Social Gatherings with Friends and Family: More than three times a week    Attends Religious Services: More than 4 times per year    Active Member of [employer] or Organizations: Not on file  Attends Archivist Meetings: Not on file    Marital Status: Widowed  Intimate Partner Violence: Not At Risk (06/16/2022)   Humiliation, Afraid, Rape, and Kick questionnaire    Fear of Current or Ex-Partner: No    Emotionally Abused: No    Physically Abused: No    Sexually Abused: No    Family History  Problem Relation Age of Onset   Lung cancer Sister    Prostate cancer Brother    Lung cancer Brother    Arthritis Other        parent   Colon cancer Neg Hx     PHYSICAL EXAM  General: Pleasant elderly Caucasian male, sitting upright in recliner in ICU with his daughter in law at bedside.   HEENT:  Normocephalic and atraumatic Neck:  No JVD.  Lungs: Normal respiratory effort on oxygen by nasal cannula, bibasilar crackles without appreciable wheezes. Heart: HRRR . Normal S1 and S2 without gallops.  3/6 systolic murmurs best heard at the RUSB.  Abdomen: Nondistended appearing  Msk:  Normal strength and tone for age. Extremities: Bilateral lower extremities with chronic venous stasis changes and scaling worse on the right with trace edema bilaterally neuro: Alert and oriented X 3. Psych:  Good  affect, responds appropriately  Labs:   Lab Results  Component Value Date   WBC 4.7 06/17/2022   HGB 11.5 (L) 06/17/2022   HCT 37.8 (L) 06/17/2022   MCV 93.3 06/17/2022   PLT 145 (L) 06/17/2022    Recent Labs  Lab 06/14/22 1526 06/15/22 0302 06/17/22 0551  NA 141   < > 142  K 3.9   < > 3.7  CL 100   < > 99  CO2 30   < > 34*  BUN 19   < > 27*  CREATININE 1.06   < > 0.99  CALCIUM 9.2   < > 8.5*  PROT 7.0  --   --   BILITOT 1.6*  --   --   ALKPHOS 87  --   --   ALT 19  --   --   AST 28  --   --   GLUCOSE 132*   < > 105*   < > = values in this interval not displayed.    Lab Results  Component Value Date   TROPONINI 0.43 [facility]) 12/29/2016     Lab Results  Component Value Date   CHOL 134 06/16/2022   CHOL 140 02/22/2021   CHOL 159 11/15/2020   Lab Results  Component Value Date   HDL 49 06/16/2022   HDL 44.70 02/22/2021   HDL 57.20 11/15/2020   Lab Results  Component Value Date   LDLCALC 72 06/16/2022   LDLCALC 71 02/22/2021   LDLCALC 87 11/15/2020   Lab Results  Component Value Date   TRIG 65 06/16/2022   TRIG 121.0 02/22/2021   TRIG 74.0 11/15/2020   Lab Results  Component Value Date   CHOLHDL 2.7 06/16/2022   CHOLHDL 3 02/22/2021   CHOLHDL 3 11/15/2020   Lab Results  Component Value Date   LDLDIRECT 143.5 08/26/2013      Radiology: DG Chest Port 1 View  Result Date: 06/15/2022 CLINICAL DATA:  Heart failure EXAM: PORTABLE CHEST 1 VIEW COMPARISON:  06/14/2022 FINDINGS: 0508 hours. The cardio pericardial silhouette is enlarged. Vascular congestion with diffuse interstitial pulmonary edema noted. Mild left base collapse/consolidation with probable tiny left effusion. Bones are diffusely demineralized. Telemetry leads overlie the chest. IMPRESSION: 1. Cardiomegaly  with pulmonary edema pattern. 2. Mild left base collapse/consolidation with probable tiny left effusion. Electronically Signed   By: Misty Stanley M.D.   On: 06/15/2022 06:32   DG Chest Port  1 View  Result Date: 06/14/2022 CLINICAL DATA:  Shortness of breath. EXAM: PORTABLE CHEST 1 VIEW COMPARISON:  Chest x-ray dated September 29, 2021. FINDINGS: The heart size and mediastinal contours are within normal limits. Pulmonary vascular congestion and mid and lower lung interstitial thickening similar to prior study. Small left pleural effusion. No pneumothorax. No acute osseous abnormality. IMPRESSION: 1. Congestive heart failure, similar to January. Electronically Signed   By: Titus Dubin M.D.   On: 06/14/2022 15:19    EKG: Sinus rhythm 87 bpm, nonspecific intraventricular conduction delay  ECHO: 09/2021  1. Left ventricular ejection fraction, by estimation, is 30 to 35%. The  left ventricle has moderately decreased function. The left ventricle  demonstrates regional wall motion abnormalities (see scoring  diagram/findings for description). Left ventricular   diastolic parameters are consistent with Grade II diastolic dysfunction  (pseudonormalization).   2. Right ventricular systolic function is normal. The right ventricular  size is not well visualized.   3. Left atrial size was severely dilated.   4. The mitral valve is normal in structure. Mild mitral valve  regurgitation. No evidence of mitral stenosis.   5. The aortic valve is calcified. Aortic valve regurgitation is not  visualized. Aortic valve sclerosis/calcification is present, without any  evidence of aortic stenosis.   Comparison(s): No significant change from prior study.   Telemetry: NSR rate 60-80s, occasional PVCs  Data Reviewed by me (LT) 9/19: CBC, BMP, I's and O's, telemetry, respiratory note  ASSESSMENT AND PLAN:   1.  NSTEMI, with elevated troponin (594, 1709, 3866, 4019, 6814, 7420, 8893, 8437 ), in the absence of chest pain, with nondiagnostic ECG, in the setting of acute on chronic systolic congestive heart failure, HFrEF, on heparin infusion 2.  Acute on chronic systolic congestive heart failure,  HFrEF, EF 30-35% by echo 09/30/2021, clinically improved after IV lasix '40mg'$  x 5 doses 3.  CAD, two-vessel coronary artery disease by cardiac catheterization 04/06/2014, without history of surgical or percutaneous intervention 4.  Myasthenia gravis, followed at [facility]  Recommendations  1.  Agree with overall current therapy 2.  Continue diuresis 3.  Carefully monitor renal status 4.  S/p heparin infusion 48 hours (ended 9/18 at 2300)  5.  continue low-dose aspirin 6.  Start clopidogrel '75mg'$  today, continue to monitor platelets 7.  continue pravastatin 8.  BBs contraindicated with history of MG 9   consider addition of low-dose lisinopril and Jardiance, pending clinical course, previously did not tolerate lisinopril due to hypotension per his daughter 46.  Continue conservative medical management and defer cardiac catheterization at this time, in the absence of chest pain, known ischemic cardiomyopathy, and in light of advanced age for which the patient and his daughter are in agreement.  This patient's plan of care was discussed and created with Dr. Nehemiah Massed and he is in agreement.    Signed: Alanson Puls Tang PA-C 06/17/2022, 11:08 AM  I had conversation with the family as well as the patient about guideline medical therapy for his cardiomyopathy and LV systolic dysfunction.  Despite echocardiogram still showing ejection fraction of 30% the patient did have myocardial infarction.  The patient at this point is not able to tolerate more than dual antiplatelet therapy and statin therapy.  He has ambulated into the chair fairly well but still  remains on oxygen due to concerns.  There has been no evidence of rhythm disturbances chest pain or congestive heart failure symptoms at this time.  After discussion with the family the patient wishes to not further intervene at this time  The patient has been interviewed and examined. I agree with assessment and plan above. Serafina Royals MD [facility]

## 2022-06-17 NOTE — Assessment & Plan Note (Addendum)
Medically managing.  No plans for cardiac catheterization intervention.  No chest pain.  Completed 48 hours of IV heparin.  Continued on aspirin and Plavix added.  Continue statin.  Patient comfortable currently.  Palliative care following as well as cardiology.  No beta-blocker due to history of myasthenia gravis.  Previously did not tolerate lisinopril due to hypotension.

## 2022-06-17 NOTE — Progress Notes (Signed)
NIF -24, Pt had good effort but appears a little sleepy at this time

## 2022-06-17 NOTE — Assessment & Plan Note (Signed)
Continued on Flomax and Proscar

## 2022-06-17 NOTE — Hospital Course (Signed)
88 year old male with past medical history of systolic heart failure myasthenia gravis who presented to the emergency room on 9/16 with shortness of breath x1 day.  Patient found to be in acute respiratory failure as well as having a non-STEMI started on BiPAP and admitted by critical care service.  Patient seen by cardiology and plans are for medical management including 48 hours of IV heparin with no cardiac catheterization.  Neurology consulted given history of myasthenia gravis.  By 9/18, patient was weaned off of BiPAP and transferred to the hospitalist service.

## 2022-06-18 ENCOUNTER — Ambulatory Visit: Payer: Medicare Other | Admitting: Occupational Therapy

## 2022-06-18 DIAGNOSIS — J9622 Acute and chronic respiratory failure with hypercapnia: Secondary | ICD-10-CM | POA: Diagnosis not present

## 2022-06-18 DIAGNOSIS — J9621 Acute and chronic respiratory failure with hypoxia: Secondary | ICD-10-CM | POA: Diagnosis not present

## 2022-06-18 LAB — CBC
HCT: 39.3 % (ref 39.0–52.0)
Hemoglobin: 12 g/dL — ABNORMAL LOW (ref 13.0–17.0)
MCH: 28.8 pg (ref 26.0–34.0)
MCHC: 30.5 g/dL (ref 30.0–36.0)
MCV: 94.2 fL (ref 80.0–100.0)
Platelets: 146 10*3/uL — ABNORMAL LOW (ref 150–400)
RBC: 4.17 MIL/uL — ABNORMAL LOW (ref 4.22–5.81)
RDW: 14.8 % (ref 11.5–15.5)
WBC: 5.2 10*3/uL (ref 4.0–10.5)
nRBC: 0 % (ref 0.0–0.2)

## 2022-06-18 LAB — BASIC METABOLIC PANEL
Anion gap: 9 (ref 5–15)
BUN: 27 mg/dL — ABNORMAL HIGH (ref 8–23)
CO2: 33 mmol/L — ABNORMAL HIGH (ref 22–32)
Calcium: 8.8 mg/dL — ABNORMAL LOW (ref 8.9–10.3)
Chloride: 98 mmol/L (ref 98–111)
Creatinine, Ser: 0.89 mg/dL (ref 0.61–1.24)
GFR, Estimated: >60 mL/min (ref >60)
Glucose, Bld: 102 mg/dL — ABNORMAL HIGH (ref 70–99)
Potassium: 3.7 mmol/L (ref 3.5–5.1)
Sodium: 140 mmol/L (ref 135–145)

## 2022-06-18 LAB — MAGNESIUM: Magnesium: 2.1 mg/dL (ref 1.7–2.4)

## 2022-06-18 LAB — PHOSPHORUS: Phosphorus: 3.4 mg/dL (ref 2.5–4.6)

## 2022-06-18 MED ORDER — INFLUENZA VAC A&B SA ADJ QUAD 0.5 ML IM PRSY: 0.5000 mL | PREFILLED_SYRINGE | INTRAMUSCULAR | Status: DC

## 2022-06-18 MED ORDER — POLYETHYLENE GLYCOL 3350 17 G PO PACK: 17.0000 g | PACK | Freq: Every day | ORAL | 0 refills | Status: DC | PRN

## 2022-06-18 MED ORDER — INFLUENZA VAC A&B SA ADJ QUAD 0.5 ML IM PRSY
0.5000 mL | PREFILLED_SYRINGE | Freq: Once | INTRAMUSCULAR | Status: AC
  Administered 2022-06-18: 0.5 mL via INTRAMUSCULAR
  Filled 2022-06-18: qty 0.5

## 2022-06-18 MED ORDER — FUROSEMIDE 10 MG/ML IJ SOLN
40.0000 mg | Freq: Two times a day (BID) | INTRAMUSCULAR | Status: AC
  Administered 2022-06-18: 40 mg via INTRAVENOUS
  Filled 2022-06-18: qty 4

## 2022-06-18 MED ORDER — DOCUSATE SODIUM 100 MG PO CAPS: 100.0000 mg | ORAL_CAPSULE | Freq: Two times a day (BID) | ORAL | 0 refills | Status: DC | PRN

## 2022-06-18 MED ORDER — CLOPIDOGREL BISULFATE 75 MG PO TABS: 75.0000 mg | ORAL_TABLET | Freq: Every day | ORAL | 0 refills | Status: DC

## 2022-06-18 NOTE — TOC Transition Note (Signed)
Transition of Care [facility]) - CM/SW Discharge Note   Patient Details  Name: [name] MRN: [id number] Date of Birth: April 20, 1926  Transition of Care [facility]) CM/SW Contact:  Shelbie Hutching, RN Phone Number: 06/18/2022, 2:56 PM   Clinical Narrative:    Patient is medically cleared for discharge home today, he does need home oxygen.  Oxygen ordered from [employer] to be delivered to the room before patient leaves to go home.     Final next level of care: OP Rehab Barriers to Discharge: Barriers Resolved   Patient Goals and CMS Choice Patient states their goals for this hospitalization and ongoing recovery are:: Hopes to go home soon CMS Medicare.gov Compare Post Acute Care list provided to:: Patient Choice offered to / list presented to : Patient  Discharge Placement                       Discharge Plan and Services   Discharge Planning Services: CM Consult            DME Arranged: Oxygen DME Agency: [employer] Date DME Agency Contacted: 06/18/22 Time DME Agency Contacted: [phone] Representative spoke with at DME Agency: [place]: Patient Refused [place]          Social Determinants of Health ([place]) Interventions     Readmission Risk Interventions     No data to display

## 2022-06-18 NOTE — Assessment & Plan Note (Signed)
09/20 -lightheaded and low BP, gave fluid bolus and responded well.  Reduced beta-blocker.  Continue to monitor vital signs closely.

## 2022-06-18 NOTE — Progress Notes (Signed)
NIF - 34 

## 2022-06-18 NOTE — Progress Notes (Addendum)
[facility] Cardiology  CARDIOLOGY CONSULT NOTE  Patient ID: [name] MRN: [id number] DOB/AGE: 1926-04-09 88 y.o.  Admit date: 06/14/2022 Referring Physician [place] Primary Physician [facility] Primary Cardiologist Nehemiah Massed Reason for Consultation NSTEMI  HPI: 88 year old gentleman referred for evaluation of NSTEMI and congestive heart failure.  Patient has a history of known coronary artery disease by cardiac catheterization 04/06/2014 CTO LAD, moderate to severe disease in OM.  The patient has chronic systolic congestive heart failure and mild aortic stenosis by echo in January 2023.  Patient also has a history of myasthenia gravis followed at [facility].  The patient presents with shortness of breath, chest x-ray revealing pulmonary edema, BNP 755, consistent with acute on chronic systolic congestive heart failure, HFrEF with EF 30-35% by echocardiogram 09/30/2021, followed by Dr. Nehemiah Massed.  Admission labs also noted for elevated troponin (594, 1709, 3866, 4019, 6814, 7420), in the absence of chest pain, with ECG revealing sinus rhythm at 87 bpm, with nonspecific intraventricular conduction delay, with inferolateral T wave inversions, which appears similar to prior ECG from 09/29/2021, consistent with NSTEMI.  The patient continues to deny chest pain.  The patient reports shortness of breath has improved after initial diuresis.  Interval History: -Feels much better today, very eager to go home -Denies worsening shortness of breath or peripheral edema, no chest pain. -Has diuresed well during admission, urine output slowing (net -3.6 L since admission)  Review of systems complete and found to be negative unless listed above     Past Medical History:  Diagnosis Date   Abnormal chest CT 03/28/2017   Allergy    Anemia    Angina pectoris ([place]) 04/02/2011   Aortic aneurysm ([place]) 03/28/2017   Saw Dr Genevive Bi 04/16/17 - recommended f/u CT in 3 months.     Atherosclerosis of both carotid arteries 07/17/2014    Basal cell carcinoma 06/25/2021   Left ant neck. Nodulocystic pattern, close to margin   Benign lipomatous neoplasm of skin and subcutaneous tissue of left arm 10/04/2013   Bilateral carotid artery stenosis 08/30/2014   Overview:  Less than 50% 2015   Bladder cancer ([place]) 03/05/2013   CAD (coronary artery disease) 06/18/2014   Cancer ([place]) 11/24/2012   bladder. BCG treatments by Dr Jacqlyn Larsen.   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 WD SCC. Re-shaved 01/14/2016. Excised 02/05/2016, margins free.   Squamous cell carcinoma of skin 11/03/2018   Right cheek ant. to sideburn. Poorly differentiated.   Squamous cell carcinoma of skin 02/02/2019   Right mid dorsum forearm. WD SCC. EDC.    Past Surgical History:  Procedure Laterality Date   [place]  Medications Prior to Admission  Medication Sig Dispense Refill Last Dose   aspirin 81 MG tablet Take 81 mg by mouth daily.   06/13/2022   finasteride (PROSCAR) 5 MG tablet Take 5 mg by mouth daily.   06/13/2022   furosemide (LASIX) 40 MG tablet Take 1 tablet (40 mg total) by mouth daily. (Patient taking differently: Take  40 mg by mouth daily.) 90 tablet 3 06/13/2022   mycophenolate (CELLCEPT) 250 MG capsule Take 1,000 mg by mouth 2 (two) times daily.    06/13/2022   pravastatin (PRAVACHOL) 20 MG tablet Take 1 tablet (20 mg total) by mouth daily. 90 tablet 1 06/13/2022   pyridostigmine (MESTINON) 60 MG tablet Take 30 mg by mouth 6 (six) times daily.    06/13/2022   tamsulosin (FLOMAX) 0.4 MG CAPS Take 0.4 mg by mouth daily.   06/13/2022   Acetaminophen (ARTHRITIS PAIN PO) Take 650 mg by mouth.      hydroxypropyl methylcellulose / hypromellose (ISOPTO TEARS / GONIOVISC) 2.5 % ophthalmic solution Place 1 drop into both eyes daily.      prednisoLONE acetate (PRED FORTE) 1 % ophthalmic suspension Place 1 drop into the right eye daily. Per patient. 5 mL 0    tacrolimus (PROTOPIC) 0.1 % ointment Apply topically 2 (two) times daily. 100 g 2    Social History   Socioeconomic History   Marital status: Widowed    Spouse name: Not on file   Number of children: Not on file   Years of education: Not on file   Highest education level: Not on file  Occupational History   Not on file  Tobacco Use   Smoking status: Former    Types: Cigarettes    Quit date: 09/29/1958    Years since quitting: 63.7   Smokeless tobacco: Never  Vaping Use   Vaping Use: Never used  Substance and Sexual Activity   Alcohol use: Yes    Alcohol/week: 0.0 standard drinks of alcohol    Comment: occasionally   Drug use: No   Sexual activity: Never  Other Topics Concern   Not on file  Social History Narrative   Pt is married with 2 children - 1 son, 1 daughter. Previously self-employed in [place] Strain: [place]  (11/19/2021)   Overall Financial Resource Strain (CARDIA)    Difficulty of Paying Living Expenses: Not hard at all  Food Insecurity: No Food Insecurity (06/16/2022)   Hunger Vital Sign    Worried About Running Out of Food in the Last Year: Never true    Ran Out of Food in  the Last Year: Never true  Transportation Needs: No Transportation Needs (06/16/2022)   PRAPARE - Hydrologist (Medical): No    Lack of Transportation (Non-Medical): No  Physical Activity: Not on file  Stress: No Stress Concern Present (11/19/2021)   [place]    Feeling of Stress : Not at all  Social Connections: Unknown (11/19/2021)   Social Connection and Isolation Panel [NHANES]    Frequency of Communication with Friends and Family: More than three times a week    Frequency of Social Gatherings with Friends and Family: More than three times a week    Attends Religious Services: More than 4 times per year    Active Member of [employer] or Organizations: Not on file    Attends Archivist  Meetings: Not on file    Marital Status: Widowed  Intimate Partner Violence: Not At Risk (06/16/2022)   Humiliation, Afraid, Rape, and Kick questionnaire    Fear of Current or Ex-Partner: No    Emotionally Abused: No    Physically Abused: No    Sexually Abused: No    Family History  Problem Relation Age of Onset   Lung cancer Sister    Prostate cancer Brother    Lung cancer Brother    Arthritis Other        parent   Colon cancer Neg Hx     PHYSICAL EXAM  General: Pleasant elderly Caucasian male, sitting upright in recliner in ICU with his son at bedside HEENT:  Normocephalic and atraumatic Neck:  No JVD.  Lungs: Normal respiratory effort on oxygen by nasal cannula, trace bibasilar crackles without appreciable wheezes.,  Overall improved during hospitalization. Heart: HRRR . Normal S1 and S2 without gallops.  3/6 systolic murmurs best heard at the RUSB.  Abdomen: Nondistended appearing  Msk:  Normal strength and tone for age. Extremities: Bilateral lower extremities with chronic venous stasis changes and scaling with slight tenderness to palpation and trace edema bilaterally neuro: Alert and  oriented X 3. Psych:  Good affect, responds appropriately  Labs:   Lab Results  Component Value Date   WBC 5.2 06/18/2022   HGB 12.0 (L) 06/18/2022   HCT 39.3 06/18/2022   MCV 94.2 06/18/2022   PLT 146 (L) 06/18/2022    Recent Labs  Lab 06/14/22 1526 06/15/22 0302 06/18/22 0424  NA 141   < > 140  K 3.9   < > 3.7  CL 100   < > 98  CO2 30   < > 33*  BUN 19   < > 27*  CREATININE 1.06   < > 0.89  CALCIUM 9.2   < > 8.8*  PROT 7.0  --   --   BILITOT 1.6*  --   --   ALKPHOS 87  --   --   ALT 19  --   --   AST 28  --   --   GLUCOSE 132*   < > 102*   < > = values in this interval not displayed.    Lab Results  Component Value Date   TROPONINI 0.43 [facility]) 12/29/2016     Lab Results  Component Value Date   CHOL 134 06/16/2022   CHOL 140 02/22/2021   CHOL 159 11/15/2020   Lab Results  Component Value Date   HDL 49 06/16/2022   HDL 44.70 02/22/2021   HDL 57.20 11/15/2020   Lab Results  Component Value Date   LDLCALC 72 06/16/2022   LDLCALC 71 02/22/2021   LDLCALC 87 11/15/2020   Lab Results  Component Value Date   TRIG 65 06/16/2022   TRIG 121.0 02/22/2021   TRIG 74.0 11/15/2020   Lab Results  Component Value Date   CHOLHDL 2.7 06/16/2022   CHOLHDL 3 02/22/2021   CHOLHDL 3 11/15/2020   Lab Results  Component Value Date   LDLDIRECT 143.5 08/26/2013      Radiology: ECHOCARDIOGRAM COMPLETE  Result Date: 06/17/2022    ECHOCARDIOGRAM REPORT   Patient Name:   [name] Date of Exam: 06/16/2022 Medical Rec #:  [id number]       Height:       70.0 in Accession #:    [id number]      Weight:  186.5 lb Date of Birth:  [birth year]       BSA:          2.026 m Patient Age:    24 years        BP:           110/52 mmHg Patient Gender: M               HR:           71 bpm. Exam Location:  [facility] Procedure: 2D Echo, Cardiac Doppler and Color Doppler Indications:     Elevated troponin  History:         Patient has prior history of Echocardiogram examinations, most                   recent 09/30/2021. CHF, CAD; Risk Factors:Former Smoker,                  Hypertension and Dyslipidemia.  Sonographer:     Rosalia Hammers Referring Phys:  [id number] [place] L [place] Diagnosing Phys: Serafina Royals MD IMPRESSIONS  1. Left ventricular ejection fraction, by estimation, is 25 to 30%. The left ventricle has severely decreased function. The left ventricle demonstrates regional wall motion abnormalities (see scoring diagram/findings for description). The left ventricular internal cavity size was moderately dilated. Left ventricular diastolic parameters were normal.  2. Right ventricular systolic function is normal. The right ventricular size is normal.  3. Left atrial size was moderately dilated.  4. Right atrial size was moderately dilated.  5. The mitral valve is normal in structure. Moderate to severe mitral valve regurgitation.  6. Tricuspid valve regurgitation is moderate.  7. The aortic valve is normal in structure. Aortic valve regurgitation is trivial. FINDINGS  Left Ventricle: Left ventricular ejection fraction, by estimation, is 25 to 30%. The left ventricle has severely decreased function. The left ventricle demonstrates regional wall motion abnormalities. Severe akinesis of the left ventricular, mid-apical anteroseptal wall, apical segment and anterior segment. The left ventricular internal cavity size was moderately dilated. There is no left ventricular hypertrophy. Left ventricular diastolic parameters were normal. Right Ventricle: The right ventricular size is normal. No increase in right ventricular wall thickness. Right ventricular systolic function is normal. Left Atrium: Left atrial size was moderately dilated. Right Atrium: Right atrial size was moderately dilated. Pericardium: There is no evidence of pericardial effusion. Mitral Valve: The mitral valve is normal in structure. Moderate to severe mitral valve regurgitation. Tricuspid Valve: The tricuspid valve is normal in  structure. Tricuspid valve regurgitation is moderate. Aortic Valve: The aortic valve is normal in structure. Aortic valve regurgitation is trivial. Aortic valve mean gradient measures 9.5 mmHg. Aortic valve peak gradient measures 16.7 mmHg. Aortic valve area, by VTI measures 1.28 cm. Pulmonic Valve: The pulmonic valve was normal in structure. Pulmonic valve regurgitation is trivial. Aorta: The aortic root and ascending aorta are structurally normal, with no evidence of dilitation. IAS/Shunts: No atrial level shunt detected by color flow Doppler.  LEFT VENTRICLE PLAX 2D LVIDd:         5.80 cm      Diastology LVIDs:         5.20 cm      LV e' medial:    5.82 cm/s LV PW:         1.30 cm      LV E/e' medial:  17.7 LV IVS:        0.90 cm      LV e' lateral:  7.07 cm/s LVOT diam:     2.20 cm      LV E/e' lateral: 14.6 LV SV:         54 LV SV Index:   27 LVOT Area:     3.80 cm  LV Volumes (MOD) LV vol d, MOD A2C: 224.0 ml LV vol d, MOD A4C: 204.0 ml LV vol s, MOD A2C: 143.0 ml LV vol s, MOD A4C: 147.0 ml LV SV MOD A2C:     81.0 ml LV SV MOD A4C:     204.0 ml LV SV MOD BP:      72.0 ml RIGHT VENTRICLE RV Basal diam:  4.30 cm RV Mid diam:    2.90 cm RV S prime:     13.10 cm/s TAPSE (M-mode): 2.8 cm LEFT ATRIUM              Index        RIGHT ATRIUM           Index LA diam:        4.40 cm  2.17 cm/m   RA Area:     21.40 cm LA Vol (A2C):   106.0 ml 52.31 ml/m  RA Volume:   67.90 ml  33.51 ml/m LA Vol (A4C):   69.3 ml  34.20 ml/m LA Biplane Vol: 86.8 ml  42.84 ml/m  AORTIC VALVE                     PULMONIC VALVE AV Area (Vmax):    1.30 cm      PR End Diast Vel: 4.58 msec AV Area (Vmean):   1.22 cm AV Area (VTI):     1.28 cm AV Vmax:           204.50 cm/s AV Vmean:          143.500 cm/s AV VTI:            0.426 m AV Peak Grad:      16.7 mmHg AV Mean Grad:      9.5 mmHg LVOT Vmax:         69.70 cm/s LVOT Vmean:        46.100 cm/s LVOT VTI:          0.143 m LVOT/AV VTI ratio: 0.34  AORTA Ao Root diam: 3.60 cm MITRAL  VALVE                TRICUSPID VALVE MV Area (PHT): 4.49 cm     TR Peak grad:   48.4 mmHg MV Decel Time: 169 msec     TR Vmax:        348.00 cm/s MV E velocity: 103.00 cm/s MV A velocity: 104.00 cm/s  SHUNTS MV E/A ratio:  0.99         Systemic VTI:  0.14 m                             Systemic Diam: 2.20 cm Serafina Royals MD Electronically signed by Serafina Royals MD Signature Date/Time: 06/17/2022/12:30:16 PM    Final    DG Chest Port 1 View  Result Date: 06/15/2022 CLINICAL DATA:  Heart failure EXAM: PORTABLE CHEST 1 VIEW COMPARISON:  06/14/2022 FINDINGS: 0508 hours. The cardio pericardial silhouette is enlarged. Vascular congestion with diffuse interstitial pulmonary edema noted. Mild left base collapse/consolidation with probable tiny left effusion. Bones are diffusely demineralized. Telemetry leads overlie the chest. IMPRESSION: 1. Cardiomegaly  with pulmonary edema pattern. 2. Mild left base collapse/consolidation with probable tiny left effusion. Electronically Signed   By: Misty Stanley M.D.   On: 06/15/2022 06:32   DG Chest Port 1 View  Result Date: 06/14/2022 CLINICAL DATA:  Shortness of breath. EXAM: PORTABLE CHEST 1 VIEW COMPARISON:  Chest x-ray dated September 29, 2021. FINDINGS: The heart size and mediastinal contours are within normal limits. Pulmonary vascular congestion and mid and lower lung interstitial thickening similar to prior study. Small left pleural effusion. No pneumothorax. No acute osseous abnormality. IMPRESSION: 1. Congestive heart failure, similar to January. Electronically Signed   By: Titus Dubin M.D.   On: 06/14/2022 15:19    EKG: Sinus rhythm 87 bpm, nonspecific intraventricular conduction delay  ECHO: 09/2021  1. Left ventricular ejection fraction, by estimation, is 30 to 35%. The  left ventricle has moderately decreased function. The left ventricle  demonstrates regional wall motion abnormalities (see scoring  diagram/findings for description). Left ventricular    diastolic parameters are consistent with Grade II diastolic dysfunction  (pseudonormalization).   2. Right ventricular systolic function is normal. The right ventricular  size is not well visualized.   3. Left atrial size was severely dilated.   4. The mitral valve is normal in structure. Mild mitral valve  regurgitation. No evidence of mitral stenosis.   5. The aortic valve is calcified. Aortic valve regurgitation is not  visualized. Aortic valve sclerosis/calcification is present, without any  evidence of aortic stenosis.   Comparison(s): No significant change from prior study.   Telemetry: NSR rate 60-80s, occasional PVCs  Data Reviewed by me (LT) 9/20: CBC, BMP, I's and O's, telemetry, hospitalist note, palliative care note  ASSESSMENT AND PLAN:   1.  NSTEMI, with elevated troponin (594, 1709, 3866, 4019, 6814, 7420, 8893, 8437 ), in the absence of chest pain, with nondiagnostic ECG, in the setting of acute on chronic systolic congestive heart failure, HFrEF, s/p heparin infusion for 48 hours 2.  Acute on chronic systolic congestive heart failure, HFrEF, EF 30-35% by echo 09/30/2021, clinically improved after IV lasix '40mg'$  x 5 doses 3.  CAD, two-vessel coronary artery disease by cardiac catheterization 04/06/2014, without history of surgical or percutaneous intervention 4.  Myasthenia gravis, [facility] at [facility]  Recommendations  1.  Agree with overall current therapy 2.  Continue 1 more dose of IV Lasix 40 mg x 1, resume home Lasix 40 mg p.o. once daily at discharge 3.  Carefully monitor renal status 4.  S/p heparin infusion 48 hours (ended 9/18 at 2300)  5.  continue low-dose aspirin 6.  Continue clopidogrel '75mg'$  today, to take with aspirin for ideally 6 months, if not longer  7.  continue pravastatin 8.  BBs contraindicated with history of MG 9   defer low-dose lisinopril and Jardiance in the setting of persistent relative hypotension during admission (SBP range 62-035 systolic,  mostly in the low 100s) 10.  Continue conservative medical management from a cardiac standpoint. 11.  Recommend attempting to wean oxygen as tolerated today, ambulate, and if still feeling well, okay for discharge from a cardiac standpoint today.  Follow-up with Dr. Nehemiah Massed in 1 to 2 weeks.  This patient's plan of care was discussed and created with Dr. Nehemiah Massed and he is in agreement.    Signed: Alanson Puls Tang PA-C 06/18/2022, 9:32 AM  The patient has felt quite well today and slept well overnight without evidence of congestive heart failure or anginal symptoms.  He has been placed on  as much guideline medical therapy as possible but has issues with hypotension.  Therefore cannot add beta-blocker ACE inhibitor Jardiance and/or Entresto at this time.  The patient may be able to have this further treated as an outpatient.  The patient does have previous history of side effects statins at this time and therefore has had the maximum dose of what he can tolerate with pravastatin at 20 mg.  He does have some continued mild amount of oxygen requirement with activities but at rest he is on room air with good oxygenation.  The patient may be able to be discharged home with follow-up in 1 to 2 weeks  The patient has been interviewed and examined. I agree with assessment and plan above. Serafina Royals MD [facility]

## 2022-06-18 NOTE — Progress Notes (Signed)
NIF -28 with good pt effort

## 2022-06-18 NOTE — Progress Notes (Signed)
Patient states he does not want to wear BIPAP tonight

## 2022-06-18 NOTE — Progress Notes (Signed)
Patient alert and oriented with family at bedside. Patient on room air, tolerating well. Patient ambulated on unit, tolerated well but did have some drop in sats with ambulation. MD notified. Oxygen provided for discharge home with patient. Education and medication instruction provided to patient and family. Patient to follow up with eye doctor regarding eye drops. Belongings returned to patient. Patient discharged with family.

## 2022-06-18 NOTE — Progress Notes (Signed)
Test with exertion: O2 sat at rest on room air: @ 90% , O2 sat at exertion on room air: @ 85%, then O2 sat at exertion on 3L/min of O2: '@95'$ %.

## 2022-06-18 NOTE — Discharge Summary (Addendum)
Physician Discharge Summary   Patient: [name] MRN: [id number]  DOB: [birth year]   Admit:     Date of Admission: 06/14/2022 Admitted from: home   Discharge: Date of discharge: 06/18/22 Disposition: Home Condition at discharge: good  CODE STATUS: FULL CODE     Discharge Physician: Emeterio Reeve, DO [facility]     PCP: [name], MD  Recommendations for Outpatient Follow-up:  Follow up with PCP [name], MD in 1-2 weeks Follow-up with Dr. Nehemiah Massed in 1 to 2 weeks OK to resume care at lymphedema clinic  Please follow up on the following pending results: none PCP AND OTHER OUTPATIENT PROVIDERS: SEE [facility] PATIENT INFO    Discharge Instructions     Diet - low sodium heart healthy   Complete by: As directed    Discharge instructions   Complete by: As directed    Follow-up with Dr. Nehemiah Massed in 1 to 2 weeks   Increase activity slowly   Complete by: As directed          Discharge Diagnoses: Principal Problem:   Acute on chronic respiratory failure with hypoxia and hypercapnia ([place]) Active Problems:   NSTEMI (non-ST elevated myocardial infarction) ([place])   Acute on chronic combined systolic and diastolic CHF (congestive heart failure) ([place])   Myasthenia gravis ([place])   Benign prostatic hyperplasia   Essential hypertension       Hospital Course: 88 year old male with past medical history of systolic heart failure myasthenia gravis who presented to the emergency room on 9/16 with shortness of breath x1 day.  Patient found to be in acute respiratory failure as well as having a non-STEMI started on BiPAP and admitted by critical care service.  Patient seen by cardiology and plans are for medical management including 48 hours of IV heparin with no cardiac catheterization.  Neurology consulted given history of myasthenia gravis.  By 9/18, patient was weaned  off of BiPAP and transferred to the hospitalist service. Continued to diurese well and stable for discharge 06/18/2022. 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   Ciprofloxacin Other (See Comments)   Last resort due to Myasthia Gravis   Sulfa Antibiotics Rash        Medication List     STOP taking these medications    prednisoLONE acetate 1 % ophthalmic suspension Commonly known as: PRED FORTE       TAKE these medications    ARTHRITIS PAIN PO Take 650 mg by mouth.   aspirin 81 MG tablet Take 81 mg by mouth daily.   clopidogrel 75 MG tablet Commonly known as: PLAVIX Take 1 tablet (75 mg total) by mouth daily. Total at least 6 mos therapy w/ clopidogrel + aspirin Start taking on: June 19, 2022   docusate sodium 100 MG capsule Commonly known as: COLACE Take 1 capsule (100 mg total) by mouth 2 (two) times daily as needed for mild constipation.   finasteride 5 MG tablet Commonly known as: PROSCAR Take 5 mg by mouth daily.  furosemide 40 MG tablet Commonly known as: LASIX Take 1 tablet (40 mg total) by mouth daily.   hydroxypropyl methylcellulose / hypromellose 2.5 % ophthalmic solution Commonly known as: ISOPTO TEARS / GONIOVISC Place 1 drop into both eyes daily.   mycophenolate 250 MG capsule Commonly known as: CELLCEPT Take 1,000 mg by mouth 2 (two) times daily.   polyethylene glycol 17 g packet Commonly known as: MIRALAX / GLYCOLAX Take 17 g by mouth daily as needed for moderate constipation.   pravastatin 20 MG tablet Commonly known as: PRAVACHOL Take 1 tablet  (20 mg total) by mouth daily.   pyridostigmine 60 MG tablet Commonly known as: MESTINON Take 30 mg by mouth 6 (six) times daily.   tacrolimus 0.1 % ointment Commonly known as: Protopic Apply topically 2 (two) times daily.   tamsulosin 0.4 MG Caps capsule Commonly known as: FLOMAX Take 0.4 mg by mouth daily.               Durable Medical Equipment  (From admission, onward)           Start     Ordered   06/18/22 1450  For home use only DME oxygen  Once       Question Answer Comment  Length of Need 6 Months   Mode or (Route) Nasal cannula   Liters per Minute 3   Frequency Continuous (stationary and portable oxygen unit needed)   Oxygen conserving device Yes   Oxygen delivery system Gas      06/18/22 1449             Follow-up Information     Corey Skains, MD. Go in 1 week(s).   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   Ciprofloxacin Other (See Comments)    Last resort due to Myasthia Gravis   Sulfa Antibiotics Rash     Subjective: Pt states no complaints, he wants to go home. Denies CP/SOB, denies HA/VC/dizzy, denies pain.    Discharge Exam: BP 113/67 (BP Location: Left Arm)   Pulse 68   Temp 98.2 F (36.8 C) (Oral)   Resp 19   Ht '5\' 10"'$  (1.778 m)   Wt 81.5 kg   SpO2 97%   BMI 25.78 kg/m  General: Pt is alert, awake, not in acute distress Cardiovascular: RRR, V6/H2 +0/9 systolic murmur, no rubs, no gallops Respiratory: CTA bilaterally except very faint crackles at bases, no wheezing, no  rhonchi Abdominal: Soft, NT, ND Extremities: no edema, no cyanosis     The results of significant diagnostics from this hospitalization (including imaging, microbiology, ancillary and laboratory) are listed below for reference.     Microbiology: Recent Results (from the past 240 hour(s))  Resp Panel by RT-PCR (Flu A&B, Covid) Anterior Nasal Swab     Status: None   Collection Time: 06/14/22  3:26 PM   Specimen: Anterior Nasal Swab  Result Value Ref Range Status   SARS Coronavirus 2 by RT PCR NEGATIVE NEGATIVE Final    Comment: (NOTE) SARS-CoV-2 target nucleic acids are NOT DETECTED.  The SARS-CoV-2 RNA is generally detectable in upper respiratory specimens during the acute phase of infection. The lowest concentration of SARS-CoV-2 viral copies this assay can detect is 138 copies/mL. A negative result does not preclude SARS-Cov-2 infection and should not be used as the sole basis for treatment or other patient management decisions. A negative result may occur with  improper specimen collection/handling, submission of specimen other than nasopharyngeal swab, presence of viral mutation(s) within the areas targeted by this assay, and inadequate number of viral copies(<138 copies/mL). A negative result must be combined with clinical observations, patient history, and epidemiological information. The expected result is Negative.  Fact Sheet for Patients:  EntrepreneurPulse.com.au  Fact Sheet for Healthcare Providers:  IncredibleEmployment.be  This test is no t yet approved or cleared by the [place] FDA and  has been authorized for detection and/or diagnosis of SARS-CoV-2 by FDA under an Emergency Use Authorization (EUA). This EUA will remain  in effect (meaning this test can be used) for the duration of the COVID-19 declaration under Section 564(b)(1) of the Act, 21 U.S.C.section 360bbb-3(b)(1), unless the authorization is terminated  or  revoked sooner.       Influenza A by PCR NEGATIVE NEGATIVE Final   Influenza B by PCR NEGATIVE NEGATIVE Final    Comment: (NOTE) The Xpert Xpress SARS-CoV-2/FLU/RSV plus assay is intended as an aid in the diagnosis of influenza from Nasopharyngeal swab specimens and should not be used as a sole basis for treatment. Nasal washings and aspirates are unacceptable for Xpert Xpress SARS-CoV-2/FLU/RSV testing.  Fact Sheet for Patients: EntrepreneurPulse.com.au  Fact Sheet for Healthcare Providers: IncredibleEmployment.be  This test is not yet approved or cleared by the [place] FDA and has been authorized for detection and/or diagnosis of SARS-CoV-2 by FDA under an Emergency Use Authorization (EUA). This EUA will remain in effect (meaning this test can be used) for the duration of the COVID-19 declaration under Section 564(b)(1) of the Act, 21 U.S.C. section 360bbb-3(b)(1), unless the authorization is terminated or revoked.  Performed at [facility], [address] [place], [place] [address]   MRSA Next Gen by PCR, Nasal     Status: None   Collection Time: 06/14/22  6:18 PM   Specimen: Nasal Mucosa; Nasal Swab  Result Value Ref Range Status   MRSA by PCR Next Gen NOT DETECTED NOT DETECTED Final    Comment: (NOTE) The GeneXpert MRSA Assay (FDA approved for NASAL specimens only), is one component of a comprehensive MRSA colonization surveillance program. It is not intended to diagnose MRSA infection nor to guide or monitor treatment for MRSA infections. Test performance is not FDA approved in patients less than 77 years old. Performed at [facility], [address] [place], [place] [address]      Labs: BNP (last 3 results) Recent Labs    09/28/21 1751 10/01/21 0905 06/14/22 1526  BNP 869.3* 580.4* 536.6*   Basic Metabolic Panel: Recent Labs  Lab 06/14/22 1526 06/15/22 0302 06/16/22 0207 06/17/22 0551  06/18/22 0424  NA 141 140 136 142 140  K 3.9 3.8 3.4* 3.7 3.7  CL 100 99 94* 99 98  CO2 30 33* 33* 34* 33*  GLUCOSE 132* 121* 110* 105* 102*  BUN 19 20 28* 27* 27*  CREATININE 1.06 1.11 0.89 0.99 0.89  CALCIUM 9.2 8.6* 8.1* 8.5* 8.8*  MG 2.1 2.1 1.9 2.1 2.1  PHOS 2.8 4.7* 2.9 3.3 3.4   Liver Function Tests: Recent Labs  Lab 06/14/22 1526  AST 28  ALT 19  ALKPHOS 87  BILITOT 1.6*  PROT 7.0  ALBUMIN 4.4   No results for input(s): "LIPASE", "AMYLASE" in the last 168 hours. No results for input(s): "AMMONIA" in the last 168 hours. CBC: Recent Labs  Lab 06/14/22 1526 06/15/22 0302 06/16/22 0207 06/17/22 0551 06/18/22 0424  WBC 9.2 8.1 7.3 4.7 5.2  HGB 12.9* 12.3* 11.8* 11.5* 12.0*  HCT 42.2 40.6 38.2* 37.8* 39.3  MCV 93.0 95.1 93.9 93.3 94.2  PLT 165 143* 131* 145* 146*   Cardiac Enzymes: No results for input(s): "CKTOTAL", "CKMB", "CKMBINDEX", "TROPONINI" in the last 168 hours. BNP: Invalid input(s): "POCBNP" CBG: Recent Labs  Lab 06/14/22 1820 06/15/22 1930  GLUCAP 182* 152*   D-Dimer No results for input(s): "DDIMER" in the last 72 hours. Hgb A1c Recent Labs    06/16/22 0201  HGBA1C 5.5   Lipid Profile Recent Labs    06/16/22 0207  CHOL 134  HDL 49  LDLCALC 72  TRIG 65  CHOLHDL 2.7   Thyroid function studies No results for input(s): "TSH", "T4TOTAL", "T3FREE", "THYROIDAB" in the last 72 hours.  Invalid input(s): "FREET3" Anemia work up No results for input(s): "VITAMINB12", "FOLATE", "FERRITIN", "TIBC", "IRON", "RETICCTPCT" in the last 72 hours. Urinalysis    Component Value Date/Time   COLORURINE STRAW (A) 09/29/2021 1244   APPEARANCEUR CLEAR (A) 09/29/2021 1244   LABSPEC 1.019 09/29/2021 1244   PHURINE 5.0 09/29/2021 [place] 09/29/2021 1244   GLUCOSEU NEGATIVE 06/30/2019 1022   HGBUR SMALL (A) 09/29/2021 1244   BILIRUBINUR NEGATIVE 09/29/2021 1244   BILIRUBINUR n 08/30/2012 1006   KETONESUR NEGATIVE 09/29/2021  [place] 09/29/2021 1244   UROBILINOGEN 0.2 06/30/2019 1022   NITRITE NEGATIVE 09/29/2021 1244   LEUKOCYTESUR NEGATIVE 09/29/2021 1244   Sepsis Labs Recent Labs  Lab 06/15/22 0302 06/16/22 0207 06/17/22 0551 06/18/22 0424  WBC 8.1 7.3 4.7 5.2   Microbiology Recent Results (from the past 240 hour(s))  Resp Panel by RT-PCR (Flu A&B, Covid) Anterior Nasal Swab     Status: None   Collection Time: 06/14/22  3:26 PM   Specimen: Anterior Nasal Swab  Result Value Ref Range Status   SARS Coronavirus 2 by RT PCR NEGATIVE NEGATIVE Final    Comment: (NOTE) SARS-CoV-2 target nucleic acids are NOT DETECTED.  The SARS-CoV-2 RNA is generally detectable in upper respiratory specimens during the acute phase of infection. The lowest concentration of SARS-CoV-2 viral copies this assay can detect is 138 copies/mL. A negative result does not preclude SARS-Cov-2 infection and should not be used as the sole basis for treatment or other patient management decisions. A negative result may occur with  improper specimen collection/handling, submission of specimen other than nasopharyngeal swab, presence of viral mutation(s) within the areas targeted by this assay, and inadequate number of viral copies(<138 copies/mL). A negative result must be combined with clinical observations, patient history, and epidemiological information. The expected result is Negative.  Fact Sheet for Patients:  EntrepreneurPulse.com.au  Fact Sheet for Healthcare Providers:  IncredibleEmployment.be  This test is no t yet approved or cleared by the [place] FDA and  has been authorized for detection and/or diagnosis of SARS-CoV-2 by FDA under an Emergency Use Authorization (EUA). This EUA will remain  in effect (meaning this test can be used) for the duration of the COVID-19 declaration under Section 564(b)(1) of the Act, 21 U.S.C.section 360bbb-3(b)(1), unless the  authorization is terminated  or revoked sooner.  Influenza A by PCR NEGATIVE NEGATIVE Final   Influenza B by PCR NEGATIVE NEGATIVE Final    Comment: (NOTE) The Xpert Xpress SARS-CoV-2/FLU/RSV plus assay is intended as an aid in the diagnosis of influenza from Nasopharyngeal swab specimens and should not be used as a sole basis for treatment. Nasal washings and aspirates are unacceptable for Xpert Xpress SARS-CoV-2/FLU/RSV testing.  Fact Sheet for Patients: EntrepreneurPulse.com.au  Fact Sheet for Healthcare Providers: IncredibleEmployment.be  This test is not yet approved or cleared by the [place] FDA and has been authorized for detection and/or diagnosis of SARS-CoV-2 by FDA under an Emergency Use Authorization (EUA). This EUA will remain in effect (meaning this test can be used) for the duration of the COVID-19 declaration under Section 564(b)(1) of the Act, 21 U.S.C. section 360bbb-3(b)(1), unless the authorization is terminated or revoked.  Performed at [facility], [address] [place], [place] [address]   MRSA Next Gen by PCR, Nasal     Status: None   Collection Time: 06/14/22  6:18 PM   Specimen: Nasal Mucosa; Nasal Swab  Result Value Ref Range Status   MRSA by PCR Next Gen NOT DETECTED NOT DETECTED Final    Comment: (NOTE) The GeneXpert MRSA Assay (FDA approved for NASAL specimens only), is one component of a comprehensive MRSA colonization surveillance program. It is not intended to diagnose MRSA infection nor to guide or monitor treatment for MRSA infections. Test performance is not FDA approved in patients less than 4 years old. Performed at [facility], [address] [place], [place] [address]    Imaging ECHOCARDIOGRAM COMPLETE  Result Date: 06/17/2022    ECHOCARDIOGRAM REPORT   Patient Name:   [name] Date of Exam: 06/16/2022 Medical Rec #:  [id number]       Height:       70.0  in Accession #:    [id number]      Weight:       186.5 lb Date of Birth:  Jan 17, 1926       BSA:          2.026 m Patient Age:    88 years        BP:           110/52 mmHg Patient Gender: M               HR:           71 bpm. Exam Location:  [facility] Procedure: 2D Echo, Cardiac Doppler and Color Doppler Indications:     Elevated troponin  History:         Patient has prior history of Echocardiogram examinations, most                  recent 09/30/2021. CHF, CAD; Risk Factors:Former Smoker,                  Hypertension and Dyslipidemia.  Sonographer:     Rosalia Hammers Referring Phys:  [id number] [place] Diagnosing Phys: Serafina Royals MD IMPRESSIONS  1. Left ventricular ejection fraction, by estimation, is 25 to 30%. The left ventricle has severely decreased function. The left ventricle demonstrates regional wall motion abnormalities (see scoring diagram/findings for description). The left ventricular internal cavity size was moderately dilated. Left ventricular diastolic parameters were normal.  2. Right ventricular systolic function is normal. The right ventricular size is normal.  3. Left atrial size was moderately dilated.  4. Right atrial size was moderately dilated.  5. The mitral valve is normal in structure. Moderate to severe mitral valve regurgitation.  6. Tricuspid valve regurgitation is moderate.  7. The aortic valve is normal in structure. Aortic valve regurgitation is trivial. FINDINGS  Left Ventricle: Left ventricular ejection fraction, by estimation, is 25 to 30%. The left ventricle has severely decreased function. The left ventricle demonstrates regional wall motion abnormalities. Severe akinesis of the left ventricular, mid-apical anteroseptal wall, apical segment and anterior segment. The left ventricular internal cavity size was moderately dilated. There is no left ventricular hypertrophy. Left ventricular diastolic parameters were normal. Right Ventricle: The right ventricular size is  normal. No increase in right ventricular wall thickness. Right ventricular systolic function is normal. Left Atrium: Left atrial size was moderately dilated. Right Atrium: Right atrial size was moderately dilated. Pericardium: There is no evidence of pericardial effusion. Mitral Valve: The mitral valve is normal in structure. Moderate to severe mitral valve regurgitation. Tricuspid Valve: The tricuspid valve is normal in structure. Tricuspid valve regurgitation is moderate. Aortic Valve: The aortic valve is normal in structure. Aortic valve regurgitation is trivial. Aortic valve mean gradient measures 9.5 mmHg. Aortic valve peak gradient measures 16.7 mmHg. Aortic valve area, by VTI measures 1.28 cm. Pulmonic Valve: The pulmonic valve was normal in structure. Pulmonic valve regurgitation is trivial. Aorta: The aortic root and ascending aorta are structurally normal, with no evidence of dilitation. IAS/Shunts: No atrial level shunt detected by color flow Doppler.  LEFT VENTRICLE PLAX 2D LVIDd:         5.80 cm      Diastology LVIDs:         5.20 cm      LV e' medial:    5.82 cm/s LV PW:         1.30 cm      LV E/e' medial:  17.7 LV IVS:        0.90 cm      LV e' lateral:   7.07 cm/s LVOT diam:     2.20 cm      LV E/e' lateral: 14.6 LV SV:         54 LV SV Index:   27 LVOT Area:     3.80 cm  LV Volumes (MOD) LV vol d, MOD A2C: 224.0 ml LV vol d, MOD A4C: 204.0 ml LV vol s, MOD A2C: 143.0 ml LV vol s, MOD A4C: 147.0 ml LV SV MOD A2C:     81.0 ml LV SV MOD A4C:     204.0 ml LV SV MOD BP:      72.0 ml RIGHT VENTRICLE RV Basal diam:  4.30 cm RV Mid diam:    2.90 cm RV S prime:     13.10 cm/s TAPSE (M-mode): 2.8 cm LEFT ATRIUM              Index        RIGHT ATRIUM           Index LA diam:        4.40 cm  2.17 cm/m   RA Area:     21.40 cm LA Vol (A2C):   106.0 ml 52.31 ml/m  RA Volume:   67.90 ml  33.51 ml/m LA Vol (A4C):   69.3 ml  34.20 ml/m LA Biplane Vol: 86.8 ml  42.84 ml/m  AORTIC VALVE                      PULMONIC VALVE AV Area (Vmax):  1.30 cm      PR End Diast Vel: 4.58 msec AV Area (Vmean):   1.22 cm AV Area (VTI):     1.28 cm AV Vmax:           204.50 cm/s AV Vmean:          143.500 cm/s AV VTI:            0.426 m AV Peak Grad:      16.7 mmHg AV Mean Grad:      9.5 mmHg LVOT Vmax:         69.70 cm/s LVOT Vmean:        46.100 cm/s LVOT VTI:          0.143 m LVOT/AV VTI ratio: 0.34  AORTA Ao Root diam: 3.60 cm MITRAL VALVE                TRICUSPID VALVE MV Area (PHT): 4.49 cm     TR Peak grad:   48.4 mmHg MV Decel Time: 169 msec     TR Vmax:        348.00 cm/s MV E velocity: 103.00 cm/s MV A velocity: 104.00 cm/s  SHUNTS MV E/A ratio:  0.99         Systemic VTI:  0.14 m                             Systemic Diam: 2.20 cm Serafina Royals MD Electronically signed by Serafina Royals MD Signature Date/Time: 06/17/2022/12:30:16 PM    Final       Time coordinating discharge: over 30 minutes  SIGNED:  Emeterio Reeve DO [facility]

## 2022-06-19 ENCOUNTER — Telehealth: Payer: Self-pay

## 2022-06-19 ENCOUNTER — Encounter: Payer: Medicare Other | Admitting: Occupational Therapy

## 2022-06-19 NOTE — Telephone Encounter (Signed)
I can see him 06/27/22 at 11:00 - if desires earlier appt.  Do you mind calling and scheduling.  Thanks.

## 2022-06-19 NOTE — Telephone Encounter (Signed)
Per son [name] and daughter [name]  Transition Care Management Follow-up Telephone Call Date of discharge and from where: 06/18/2022 [facility] How have you been since you were released from the hospital? Doing good Any questions or concerns? No  Items Reviewed: Did the pt receive and understand the discharge instructions provided? Yes  Medications obtained and verified? Yes  Other? No  Any new allergies since your discharge? No  Dietary orders reviewed? Yes Do you have support at home? Yes   Home Care and Equipment/Supplies: Were home health services ordered? no If so, what is the name of the agency? N/a  Has the agency set up a time to come to the patient's home? not applicable Were any new equipment or medical supplies ordered?  Yes: oxygen What is the name of the medical supply agency?  Were you able to get the supplies/equipment? yes Do you have any questions related to the use of the equipment or supplies? Yes: feels compressor is too big  Functional Questionnaire: (I = Independent and D = Dependent) ADLs: I  Bathing/Dressing- I  Meal Prep- I  Eating- I  Maintaining continence- I  Transferring/Ambulation- I  Managing Meds- I  Follow up appointments reviewed:  PCP Hospital f/u appt confirmed? Yes  Scheduled to see Dr. Nicki Reaper on 07/01/2022 @ 11:00. [facility] f/u appt confirmed? Yes  seeing cardiologist on 07/18/2022 Are transportation arrangements needed? No  If their condition worsens, is the pt aware to call PCP or go to the Emergency Dept.? Yes Was the patient provided with contact information for the PCP's office or ED? Yes Was to pt encouraged to call back with questions or concerns? Yes

## 2022-06-19 NOTE — Telephone Encounter (Signed)
I do not mind signing order.  Please hold for order.  Please call if we do not receive.

## 2022-06-19 NOTE — Telephone Encounter (Signed)
Patient's daughter, [name], called to state Occupational Therapy will be faxing a request to resume lymphedema treatment after hospital discharge on 06/18/2022.  [name] states patient has a previously scheduled lymphedema treatment on 06/23/2022.  [name] states she believes Dr. [name] originally referred patient for lymphedema treatment, but occupational therapy representative was concerned that they may not be able to get a response in time from their office.  [name] states patient is scheduled to see his heart doctor on 06/23/2022 at 10am.

## 2022-06-20 NOTE — Telephone Encounter (Signed)
Thank you :)

## 2022-06-20 NOTE — Telephone Encounter (Signed)
Signed and placed in box at desk

## 2022-06-20 NOTE — Telephone Encounter (Signed)
Patient 's daughter got patient in at Cardiology on 06/23/2022. They want to keep the October 3rd with Dr Nicki Reaper.

## 2022-06-20 NOTE — Telephone Encounter (Signed)
Orders received and placed for Dr Nicki Reaper to review

## 2022-06-20 NOTE — Telephone Encounter (Signed)
Form faxed over

## 2022-06-23 ENCOUNTER — Ambulatory Visit: Payer: Medicare Other | Admitting: Occupational Therapy

## 2022-06-23 DIAGNOSIS — I890 Lymphedema, not elsewhere classified: Secondary | ICD-10-CM | POA: Diagnosis not present

## 2022-06-23 DIAGNOSIS — I350 Nonrheumatic aortic (valve) stenosis: Secondary | ICD-10-CM | POA: Diagnosis not present

## 2022-06-23 DIAGNOSIS — I5023 Acute on chronic systolic (congestive) heart failure: Secondary | ICD-10-CM | POA: Diagnosis not present

## 2022-06-23 DIAGNOSIS — I119 Hypertensive heart disease without heart failure: Secondary | ICD-10-CM | POA: Diagnosis not present

## 2022-06-23 DIAGNOSIS — I5022 Chronic systolic (congestive) heart failure: Secondary | ICD-10-CM | POA: Diagnosis not present

## 2022-06-23 NOTE — Therapy (Signed)
[place] MAIN [facility] SERVICES [address] [place], [place], [address] Phone: [phone]   Fax:  [phone]  Occupational Therapy Treatment  Patient Details  Name: [name] MRN: [id number] Date of Birth: 12-11-1925 Referring Provider (OT): [name], MD   Encounter Date: 06/23/2022   OT End of Session - 06/23/22 1412     Visit Number 4    Number of Visits 36    Date for OT Re-Evaluation 09/09/22    OT Start Time 0205    OT Stop Time 0305    OT Time Calculation (min) 60 min    Activity Tolerance Patient tolerated treatment well;No increased pain    Behavior During Therapy [facility] for tasks assessed/performed             Past Medical History:  Diagnosis Date   Abnormal chest CT 03/28/2017   Allergy    Anemia    Angina pectoris ([place]) 04/02/2011   Aortic aneurysm ([place]) 03/28/2017   Saw Dr Genevive Bi 04/16/17 - recommended f/u CT in 3 months.     Atherosclerosis of both carotid arteries 07/17/2014   Basal cell carcinoma 06/25/2021   Left ant neck. Nodulocystic pattern, close to margin   Benign lipomatous neoplasm of skin and subcutaneous tissue of left arm 10/04/2013   Bilateral carotid artery stenosis 08/30/2014   Overview:  Less than 50% 2015   Bladder cancer ([place]) 03/05/2013   CAD (coronary artery disease) 06/18/2014   Cancer ([place]) 11/24/2012   bladder. BCG treatments by Dr Jacqlyn Larsen.   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 WD SCC. Re-shaved 01/14/2016. Excised 02/05/2016, margins free.   Squamous cell carcinoma of skin 11/03/2018   Right cheek ant. to sideburn. Poorly differentiated.   Squamous cell carcinoma of skin 02/02/2019   Right mid dorsum forearm. WD SCC. EDC.    Past Surgical History:  Procedure Laterality Date   [place]    There were no vitals filed for this visit.   Subjective Assessment - 06/23/22 1504     Subjective  [name] presents for OT visit to address BLE lymphedema. He is accompanied by his son today. He was last seen on 9/15. During visit interval he was hospitalized for an MI. He reports he is feeling well tday and denies leg pain. "My legs are way down. I brought my old stockings with me today so you can see them."    Patient is accompanied by: Family member    Pertinent History chronic BLE lymphedema (LE) and associated pain/ discomfort. Altered sensation/numbness LLE, Hx lymphorrhea, hx non-pressure related leg ulcer, stasis dermatitis, AORTIC  ANEURISM, BILATERAL CAROTID STENOSIS, BLADDER CANCER 03/05/13, CAD, CHF. htn, s/p squamous cell carcinoma 2020 x 2, 20117    Limitations decreased endurance, impaired balance, chronic leg swelling w associated pain, hx skin ulcer, limited knowledge of DME,decreased hip and knee AROM- difficulty reaching feet;    Repetition Increases Symptoms    Special Tests +[place]mer bilaterally, FOTO TBA Rx visit 1    Patient Stated Goals "get this swelling down and keep it from getting worse again    Pain Onset Other (comment)                          OT Treatments/Exercises (OP) - 06/23/22 1506        ADLs   ADL Education Given Yes      Manual Therapy   Manual Therapy Edema management;Other (comment)    Other Manual Therapy Max A to don compression garments to conserve time. Garments assesseds. Worn out and in need of replacement.                    OT Education - 06/23/22 1513     Education Details Skilled OT instruction in LE self-care, including custom compression garments, or alternatives, wear and care regimes, garment positioning, contraindications. Reviewed donning and doffing garments using assistive devices .    Person(s) Educated Patient;Child(ren)    Methods Explanation;Demonstration;Handout    Comprehension Verbalized understanding;Returned demonstration                 OT Long Term Goals - 06/11/22 1437       OT LONG TERM GOAL #1   Title Given this patient's risk-adjustment variables, and is Intake Functional Status score of /100 on the FOTO tool, patient will experience at least an increase in function of 5 points, or higher.    Baseline TBA    Time 12    Period Weeks    Status New   unable to assess on 03/19/21 for 19th visit progress note due to technology failure. Will assess at 3 month f/u.   Target Date 09/09/22      OT LONG TERM GOAL #2   Title Pt will be able to apply knee length, multi-layer, short stretch compression wraps using correct gradient techniques with max caregiver Assistance  to decrease limb volume and associated  leg pain and limit infection risk,    Baseline dependent    Time 4    Period Days    Status New   Max A from caregiver required   Target Date --   4th OT Rx visit     OT LONG TERM GOAL #3   Title Patient will demonstrate understanding of LE precautions, prevention strategies and cellulitis signs and symptoms by verbalizing at least 4 examples using  printed reference( modified independence)  to limit infection risk, recurrent wounds and LE progression.    Baseline Max A    Time 4    Period Days    Status New     Target Date --   4th OT rx visit     OT LONG TERM GOAL #4   Title Pt will achieve 5%, or greater,  limb volume reduction on the R below the knee, and 10% reduction on the L below the knee (ankle to tibial tuberosity = A-D) during Intensive Phase CDT to achieve optimal limb volume reduction and to prevent re-accumulation of lymphatic congestion and fibrosis in the legs, to limit infection risk, to  improve functional ambulation and transfers, to improve functional performance of basic and instrumental ADLs, and to limit LE progression.    Baseline Max A    Time 12    Period Weeks    Status New   03/19/21: RLE reduction = 20.96% from ankle to popliteal (A-D). LLE volume increase = 13.98% since 01/02/21.   Target Date 09/09/22      OT LONG TERM GOAL #5   Title With max CG assistance Pt will demonstrate and sustain a least 85% compliance performing all daily LE self-care home program components daily throughout Intensive Phase CDT, including recommended skin care regime, lymphatic pumping ther ex, 23/7 compression wraps and simple self-MLD, to ensure optimal limb volume reduction, to limit infection risk and to limit LE progression.    Baseline dependent    Time 12    Period Weeks    Status New   Pt has had difficulty with compliance with compression wraps to date. He has not been able to apply wraps himself. A very helpful friend/ neighbor learned to apply wraps at last visit. She did apply them this past weekend   Target Date 09/09/22      OT LONG TERM GOAL #6   Title Using assistive devices and additional time  (modified independence)  Pt will be able to don and doff appropriate daytime compression garments and HOS devices to limit lymphatic re-accumulation and LE progression with before transitioning to self-management phase of CDT.    Baseline Max A    Time 12    Period --   issue date   Status New                   Plan - 06/23/22 1503     Clinical Impression Statement [name] returns to OT today for lymphedema care to BLE . During visit interval he was hospitalized for a MI after working in his yard. BLE swelling is significantly decreased since last seen. Family were wrapping his [name] legs in the hgospital and at home. Existing ofrf-the-shelf, knee length, compression garments, Juzo SOFT, SIZE 4, REG length, open toe, w silicone top band are worn out and need replacement, but size and other sprecifications remain appropriate for Pt. Printed 3 online resources for recommended garments and highlighted specifications.Daughter will order. Pt agrees to wear existing sockings daily and return to OT for fitting and follow up when new stockings arrive, All remaining appointments are cancelled until garment fitting.    OT Occupational Profile and History Detailed Assessment- Review of Records and additional review of physical, cognitive, psychosocial history related to current functional performance    Occupational performance deficits (Please refer to evaluation for details): ADL's;IADL's;Work;Leisure;Social Participation    Body Structure / Function / Physical Skills ADL;Decreased knowledge of use of DME;Balance;Pain;Edema;Endurance;IADL;Scar mobility;Mobility;Decreased knowledge of precautions;Skin integrity    Rehab Potential Good    Clinical Decision Making Several treatment options, min-mod task modification necessary    Comorbidities Affecting Occupational Performance: Presence of comorbidities impacting occupational performance    Comorbidities impacting occupational performance description: CVI, CAD, hx non-pressure related leg ulcer    Modification or Assistance to Complete Evaluation  Min-Moderate modification of tasks or assist with assess necessary to complete eval    OT Frequency 3x / week    OT Duration 12 weeks    OT Treatment/Interventions Self-care/ADL training;DME and/or AE instruction;Manual lymph drainage;Compression bandaging;Therapeutic  activities;Therapeutic exercise;Scar mobilization;Other (comment);Manual Therapy;Patient/family education    Plan Complete Decongestive Therapy (CDT) Pt  and CG will be taught to apply multilayer gradient compression wraps below knee to base of toes. CDT also to include manual lymphatic drainage, skin care, therapeutic exercise and fitting with appropriate knee length compression garments that are comfortable and Pt is able to don and doff using assistive devices PRN.    Consulted and Agree with Plan of Care Patient;Family member/caregiver    Family Member Consulted daughter, [name]             Patient will benefit from skilled therapeutic intervention in order to improve the following deficits and impairments:   Body Structure / Function / Physical Skills: ADL, Decreased knowledge of use of DME, Balance, Pain, Edema, Endurance, IADL, Scar mobility, Mobility, Decreased knowledge of precautions, Skin integrity       Visit Diagnosis: Lymphedema, not elsewhere classified    Problem List Patient Active Problem List   Diagnosis Date Noted   NSTEMI (non-ST elevated myocardial infarction) ([place])    Acute on chronic respiratory failure with hypoxia and hypercapnia ([place]) 06/14/2022   Myasthenia gravis ([place])    Stasis dermatitis 05/24/2022   Acute on chronic combined systolic and diastolic CHF (congestive heart failure) ([place]) 09/28/2021   Benign prostatic hyperplasia 09/28/2021   Rectal bleeding 02/24/2021   Rectal irritation 11/25/2020   Lymphedema 11/19/2020   Pressure injury of right buttock, stage 2 ([place]) 04/23/2020   Lower limb ulcer, calf, left, limited to breakdown of skin ([place]) 12/27/2019   Dysuria 06/30/2019   Hyperglycemia 06/30/2019   LVH (left ventricular hypertrophy) due to hypertensive disease, without heart failure 05/26/2019   Pseudophakia, left eye 11/26/2018   Venous insufficiency of both lower extremities 10/13/2018   Swelling of limb 10/12/2018   Bilateral lower  extremity edema 10/10/2018   Absent pulse in lower extremity 10/10/2018   Corneal thinning of right eye 07/17/2018   Eye pain 05/31/2018   Mild aortic valve stenosis 12/03/2017   Epididymoorchitis 09/02/2017   Hydrocele, right 08/11/2017   Aortic aneurysm ([place]) 03/28/2017   Abnormal chest CT 03/28/2017   Acute respiratory failure with hypoxia ([facility]) 12/27/2016   Hypotension    Dizziness 12/22/2016   Leg weakness 03/11/2016   Skin lesion 09/11/2015   Moderate mitral insufficiency 05/18/2015   Moderate tricuspid insufficiency 05/18/2015   Essential hypertension 04/25/2015   Rash 03/10/2015   Health care maintenance 03/10/2015   Bilateral carotid artery stenosis 08/30/2014   Atherosclerosis of both carotid arteries 56/38/7564   Chronic systolic CHF (congestive heart failure), NYHA class 3 ([place]) 07/17/2014   Coronary artery disease involving native coronary artery of native heart without angina pectoris 06/18/2014   CHF (congestive heart failure) ([place]) 04/20/2014   Congestive heart failure ([place]) 04/20/2014   SOB (shortness of breath) 03/10/2014   Abnormal EKG 03/10/2014   Mixed hyperlipidemia 03/10/2014   Benign lipomatous neoplasm of skin and subcutaneous tissue of left arm 10/04/2013   Arm skin lesion, left 09/04/2013   History of shingles 09/04/2013   History of bladder cancer 03/05/2013   Chronic prostatitis 09/30/2012   Elevated prostate specific antigen (PSA) 09/30/2012   Family history of malignant neoplasm of prostate 09/30/2012   Gross hematuria 09/30/2012   Incomplete emptying of bladder 09/30/2012   Anemia 08/30/2012   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

## 2022-06-24 ENCOUNTER — Ambulatory Visit: Payer: Medicare Other | Admitting: Family

## 2022-06-25 ENCOUNTER — Ambulatory Visit: Payer: Medicare Other | Admitting: Occupational Therapy

## 2022-06-26 ENCOUNTER — Ambulatory Visit: Payer: Medicare Other | Admitting: Occupational Therapy

## 2022-07-01 ENCOUNTER — Ambulatory Visit (INDEPENDENT_AMBULATORY_CARE_PROVIDER_SITE_OTHER): Payer: Medicare Other | Admitting: Internal Medicine

## 2022-07-01 ENCOUNTER — Encounter: Payer: Self-pay | Admitting: Internal Medicine

## 2022-07-01 VITALS — BP 110/58 | HR 78 | Temp 97.9°F | Ht 70.0 in | Wt 185.8 lb

## 2022-07-01 DIAGNOSIS — I872 Venous insufficiency (chronic) (peripheral): Secondary | ICD-10-CM | POA: Diagnosis not present

## 2022-07-01 DIAGNOSIS — I10 Essential (primary) hypertension: Secondary | ICD-10-CM

## 2022-07-01 DIAGNOSIS — I2510 Atherosclerotic heart disease of native coronary artery without angina pectoris: Secondary | ICD-10-CM | POA: Diagnosis not present

## 2022-07-01 DIAGNOSIS — D649 Anemia, unspecified: Secondary | ICD-10-CM

## 2022-07-01 DIAGNOSIS — G7080 Lambert-Eaton syndrome, unspecified: Secondary | ICD-10-CM

## 2022-07-01 DIAGNOSIS — R739 Hyperglycemia, unspecified: Secondary | ICD-10-CM

## 2022-07-01 DIAGNOSIS — Z8551 Personal history of malignant neoplasm of bladder: Secondary | ICD-10-CM

## 2022-07-01 DIAGNOSIS — E7800 Pure hypercholesterolemia, unspecified: Secondary | ICD-10-CM

## 2022-07-01 DIAGNOSIS — I5022 Chronic systolic (congestive) heart failure: Secondary | ICD-10-CM | POA: Diagnosis not present

## 2022-07-01 DIAGNOSIS — N400 Enlarged prostate without lower urinary tract symptoms: Secondary | ICD-10-CM

## 2022-07-01 DIAGNOSIS — I712 Thoracic aortic aneurysm, without rupture, unspecified: Secondary | ICD-10-CM

## 2022-07-01 NOTE — Progress Notes (Signed)
Patient ID: [name], male   DOB: October 16, 1925, 88 y.o.   MRN: [id number]   Subjective:    Patient ID: [name], male    DOB: July 02, 1926, 88 y.o.   MRN: [id number]   Patient here for  Chief Complaint  Patient presents with   [facility] follow up   .   HPI Admitted 06/14/22 - SOB- acute resp failure - Non- STEMI.  Started on BIPAP.  CXR pulmonary edema with BNP 755,  diuresed.  Discharged on home lasix and DAPT. Since his discharge, he has been doing relatively well.  Has been weighing daily and monitoring heart rate, blood pressure and pulse ox.  Reviewed.  Weight has been stable.  Discussed low sodium diet.  Breathing stable (improved from hospitalization).  No increased cough or congestion.  Swallowing ok.  No vomiting.  No abdominal pain.     Past Medical History:  Diagnosis Date   Abnormal chest CT 03/28/2017   Allergy    Anemia    Angina pectoris ([place]) 04/02/2011   Aortic aneurysm ([place]) 03/28/2017   Saw Dr Genevive Bi 04/16/17 - recommended f/u CT in 3 months.     Atherosclerosis of both carotid arteries 07/17/2014   Basal cell carcinoma 06/25/2021   Left ant neck. Nodulocystic pattern, close to margin   Benign lipomatous neoplasm of skin and subcutaneous tissue of left arm 10/04/2013   Bilateral carotid artery stenosis 08/30/2014   Overview:  Less than 50% 2015   Bladder cancer ([place]) 03/05/2013   CAD (coronary artery disease) 06/18/2014   Cancer ([place]) 11/24/2012   bladder. BCG treatments by Dr Jacqlyn Larsen.   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 WD SCC. Re-shaved 01/14/2016. Excised 02/05/2016, margins free.   Squamous cell carcinoma of skin 11/03/2018   Right cheek ant. to sideburn. Poorly differentiated.   Squamous cell carcinoma of skin 02/02/2019   Right mid dorsum forearm. WD SCC. EDC.   Past Surgical History:  Procedure Laterality Date   [place]   Family History  Problem Relation Age of Onset   Lung cancer Sister    Prostate cancer Brother    Lung cancer Brother    Arthritis Other        parent   Colon cancer Neg Hx    Social History   Socioeconomic History   Marital status: Widowed    Spouse name: Not on file   Number of children: Not on file   Years of education: Not on file   Highest education level: Not on file  Occupational History   Not on file  Tobacco Use  Smoking status: Former    Types: Cigarettes    Quit date: 09/29/1958    Years since quitting: 63.8   Smokeless tobacco: Never  Vaping Use   Vaping Use: Never used  Substance and Sexual Activity   Alcohol use: Yes    Alcohol/week: 0.0 standard drinks of alcohol    Comment: occasionally   Drug use: No   Sexual activity: Never  Other Topics Concern   Not on file  Social History Narrative   Pt is married with 2 children - 1 son, 1 daughter. Previously self-employed in [place] Strain: [place]  (11/19/2021)   Overall Financial Resource Strain (CARDIA)    Difficulty of Paying Living Expenses: Not hard at all  Food Insecurity: No Food Insecurity (06/16/2022)   Hunger  Vital Sign    Worried About Running Out of Food in the Last Year: Never true    Ran Out of Food in the Last Year: Never true  Transportation Needs: No Transportation Needs (06/16/2022)   PRAPARE - Hydrologist (Medical): No    Lack of Transportation (Non-Medical): No  Physical Activity: Not on file  Stress: No Stress Concern Present (11/19/2021)   [place]    Feeling of Stress : Not at all  Social Connections: Unknown (11/19/2021)   Social Connection and Isolation Panel [NHANES]    Frequency of Communication with Friends and Family: More than three times a week    Frequency of Social Gatherings with Friends and Family: More than three times a week    Attends Religious Services: More than 4 times per year    Active Member of [employer] or Organizations: Not on file    Attends Archivist Meetings: Not on file    Marital Status: Widowed     Review of Systems  Constitutional:  Negative for appetite change.       Weight remaining stable.   HENT:  Negative for congestion and sinus pressure.   Respiratory:  Negative for cough and chest tightness.        Breathing better since discharge.   Cardiovascular:  Positive for leg swelling. Negative for chest pain and palpitations.  Gastrointestinal:  Negative for abdominal pain, diarrhea, nausea and vomiting.  Genitourinary:  Negative for difficulty urinating and dysuria.  Musculoskeletal:  Negative for joint swelling and myalgias.  Skin:  Negative for color change and rash.  Neurological:  Negative for dizziness and headaches.  Psychiatric/Behavioral:  Negative for agitation and dysphoric mood.        Objective:     BP (!) 110/58 (BP Location: Left Arm, Patient Position: Sitting, Cuff Size: Normal)   Pulse 78   Temp 97.9 F (36.6 C) (Oral)   Ht $R'5\' 10"'pS$  (1.778 m)   Wt 185 lb 12.8 oz (84.3 kg)   SpO2 95%   BMI 26.66 kg/m  Wt Readings  from Last 3 Encounters:  07/01/22 185 lb 12.8 oz (84.3 kg)  06/18/22 179 lb 10.8 oz (81.5 kg)  12/13/21 192 lb 9.6 oz (87.4 kg)    Physical Exam Constitutional:      General: He is not in acute distress.    Appearance: Normal appearance. He is well-developed.  HENT:     Head: Normocephalic and atraumatic.     Right Ear: External ear normal.     Left Ear: External ear normal.  Eyes:  General: No scleral icterus.       Right eye: No discharge.        Left eye: No discharge.  Cardiovascular:     Rate and Rhythm: Normal rate and regular rhythm.  Pulmonary:     Effort: Pulmonary effort is normal. No respiratory distress.     Breath sounds: Normal breath sounds.  Abdominal:     General: Bowel sounds are normal.     Palpations: Abdomen is soft.     Tenderness: There is no abdominal tenderness.  Musculoskeletal:        General: No tenderness.     Cervical back: Neck supple. No tenderness.     Comments: Lower extremities - wrapped - swelling - stable.   Lymphadenopathy:     Cervical: No cervical adenopathy.  Skin:    Findings: No erythema or rash.  Neurological:     Mental Status: He is alert.  Psychiatric:        Mood and Affect: Mood normal.        Behavior: Behavior normal.      Outpatient Encounter Medications as of 07/01/2022  Medication Sig   Acetaminophen (ARTHRITIS PAIN PO) Take 650 mg by mouth.   aspirin 81 MG tablet Take 81 mg by mouth daily.   clopidogrel (PLAVIX) 75 MG tablet Take 1 tablet (75 mg total) by mouth daily. Total at least 6 mos therapy w/ clopidogrel + aspirin   docusate sodium (COLACE) 100 MG capsule Take 1 capsule (100 mg total) by mouth 2 (two) times daily as needed for mild constipation.   finasteride (PROSCAR) 5 MG tablet Take 5 mg by mouth daily.   hydroxypropyl methylcellulose / hypromellose (ISOPTO TEARS / GONIOVISC) 2.5 % ophthalmic solution Place 1 drop into both eyes daily.   mycophenolate (CELLCEPT) 250 MG capsule Take 1,000 mg by mouth  2 (two) times daily.    polyethylene glycol (MIRALAX / GLYCOLAX) 17 g packet Take 17 g by mouth daily as needed for moderate constipation.   pravastatin (PRAVACHOL) 20 MG tablet Take 1 tablet (20 mg total) by mouth daily.   pyridostigmine (MESTINON) 60 MG tablet Take 30 mg by mouth 6 (six) times daily.    tacrolimus (PROTOPIC) 0.1 % ointment Apply topically 2 (two) times daily.   tamsulosin (FLOMAX) 0.4 MG CAPS Take 0.4 mg by mouth daily.   furosemide (LASIX) 40 MG tablet Take 1 tablet (40 mg total) by mouth daily. (Patient taking differently: Take 40 mg by mouth daily.)   No facility-administered encounter medications on file as of 07/01/2022.     Lab Results  Component Value Date   WBC 5.2 06/18/2022   HGB 12.0 (L) 06/18/2022   HCT 39.3 06/18/2022   PLT 146 (L) 06/18/2022   GLUCOSE 102 (H) 06/18/2022   CHOL 134 06/16/2022   TRIG 65 06/16/2022   HDL 49 06/16/2022   LDLDIRECT 143.5 08/26/2013   LDLCALC 72 06/16/2022   ALT 19 06/14/2022   AST 28 06/14/2022   NA 140 06/18/2022   K 3.7 06/18/2022   CL 98 06/18/2022   CREATININE 0.89 06/18/2022   BUN 27 (H) 06/18/2022   CO2 33 (H) 06/18/2022   TSH 1.543 09/29/2021   INR 1.2 06/14/2022   HGBA1C 5.5 06/16/2022    ECHOCARDIOGRAM COMPLETE  Result Date: 06/17/2022    ECHOCARDIOGRAM REPORT   Patient Name:   TYWAN SIEVER Date of Exam: 06/16/2022 Medical Rec #:  [id number]       Height:  70.0 in Accession #:    [id number]      Weight:       186.5 lb Date of Birth:  10-21-1925       BSA:          2.026 m Patient Age:    73 years        BP:           110/52 mmHg Patient Gender: M               HR:           71 bpm. Exam Location:  [facility] Procedure: 2D Echo, Cardiac Doppler and Color Doppler Indications:     Elevated troponin  History:         Patient has prior history of Echocardiogram examinations, most                  recent 09/30/2021. CHF, CAD; Risk Factors:Former Smoker,                  Hypertension and Dyslipidemia.  Sonographer:      Rosalia Hammers Referring Phys:  [id number] Levy L RUST-CHESTER Diagnosing Phys: Serafina Royals MD IMPRESSIONS  1. Left ventricular ejection fraction, by estimation, is 25 to 30%. The left ventricle has severely decreased function. The left ventricle demonstrates regional wall motion abnormalities (see scoring diagram/findings for description). The left ventricular internal cavity size was moderately dilated. Left ventricular diastolic parameters were normal.  2. Right ventricular systolic function is normal. The right ventricular size is normal.  3. Left atrial size was moderately dilated.  4. Right atrial size was moderately dilated.  5. The mitral valve is normal in structure. Moderate to severe mitral valve regurgitation.  6. Tricuspid valve regurgitation is moderate.  7. The aortic valve is normal in structure. Aortic valve regurgitation is trivial. FINDINGS  Left Ventricle: Left ventricular ejection fraction, by estimation, is 25 to 30%. The left ventricle has severely decreased function. The left ventricle demonstrates regional wall motion abnormalities. Severe akinesis of the left ventricular, mid-apical anteroseptal wall, apical segment and anterior segment. The left ventricular internal cavity size was moderately dilated. There is no left ventricular hypertrophy. Left ventricular diastolic parameters were normal. Right Ventricle: The right ventricular size is normal. No increase in right ventricular wall thickness. Right ventricular systolic function is normal. Left Atrium: Left atrial size was moderately dilated. Right Atrium: Right atrial size was moderately dilated. Pericardium: There is no evidence of pericardial effusion. Mitral Valve: The mitral valve is normal in structure. Moderate to severe mitral valve regurgitation. Tricuspid Valve: The tricuspid valve is normal in structure. Tricuspid valve regurgitation is moderate. Aortic Valve: The aortic valve is normal in structure. Aortic valve  regurgitation is trivial. Aortic valve mean gradient measures 9.5 mmHg. Aortic valve peak gradient measures 16.7 mmHg. Aortic valve area, by VTI measures 1.28 cm. Pulmonic Valve: The pulmonic valve was normal in structure. Pulmonic valve regurgitation is trivial. Aorta: The aortic root and ascending aorta are structurally normal, with no evidence of dilitation. IAS/Shunts: No atrial level shunt detected by color flow Doppler.  LEFT VENTRICLE PLAX 2D LVIDd:         5.80 cm      Diastology LVIDs:         5.20 cm      LV e' medial:    5.82 cm/s LV PW:         1.30 cm      LV E/e' medial:  17.7  LV IVS:        0.90 cm      LV e' lateral:   7.07 cm/s LVOT diam:     2.20 cm      LV E/e' lateral: 14.6 LV SV:         54 LV SV Index:   27 LVOT Area:     3.80 cm  LV Volumes (MOD) LV vol d, MOD A2C: 224.0 ml LV vol d, MOD A4C: 204.0 ml LV vol s, MOD A2C: 143.0 ml LV vol s, MOD A4C: 147.0 ml LV SV MOD A2C:     81.0 ml LV SV MOD A4C:     204.0 ml LV SV MOD BP:      72.0 ml RIGHT VENTRICLE RV Basal diam:  4.30 cm RV Mid diam:    2.90 cm RV S prime:     13.10 cm/s TAPSE (M-mode): 2.8 cm LEFT ATRIUM              Index        RIGHT ATRIUM           Index LA diam:        4.40 cm  2.17 cm/m   RA Area:     21.40 cm LA Vol (A2C):   106.0 ml 52.31 ml/m  RA Volume:   67.90 ml  33.51 ml/m LA Vol (A4C):   69.3 ml  34.20 ml/m LA Biplane Vol: 86.8 ml  42.84 ml/m  AORTIC VALVE                     PULMONIC VALVE AV Area (Vmax):    1.30 cm      PR End Diast Vel: 4.58 msec AV Area (Vmean):   1.22 cm AV Area (VTI):     1.28 cm AV Vmax:           204.50 cm/s AV Vmean:          143.500 cm/s AV VTI:            0.426 m AV Peak Grad:      16.7 mmHg AV Mean Grad:      9.5 mmHg LVOT Vmax:         69.70 cm/s LVOT Vmean:        46.100 cm/s LVOT VTI:          0.143 m LVOT/AV VTI ratio: 0.34  AORTA Ao Root diam: 3.60 cm MITRAL VALVE                TRICUSPID VALVE MV Area (PHT): 4.49 cm     TR Peak grad:   48.4 mmHg MV Decel Time: 169 msec     TR  Vmax:        348.00 cm/s MV E velocity: 103.00 cm/s MV A velocity: 104.00 cm/s  SHUNTS MV E/A ratio:  0.99         Systemic VTI:  0.14 m                             Systemic Diam: 2.20 cm Serafina Royals MD Electronically signed by Serafina Royals MD Signature Date/Time: 06/17/2022/12:30:16 PM    Final        Assessment & Plan:   Problem List Items Addressed This Visit     Anemia    Recheck cbc next labs.       Aortic aneurysm ([place])  Previously evaluated by Dr Genevive Bi.  Stable.  Recommended f/u prn.       Benign prostatic hyperplasia    Continue flomax and proscar.  Stable.       Chronic systolic CHF (congestive heart failure), NYHA class 3 (HCC)    ECHO 09/2021 - EF 30-35%.  Remains on lasix.  Breathing stable currently. Following his weight. Reviewed outside weights.  Stable.  Continue to avoid increased sodium intake.  Follow.       Coronary artery disease involving native coronary artery of native heart without angina pectoris - Primary    Admitted 06/14/22 - SOB- acute resp failure - Non- STEMI.  Started on BIPAP.  CXR pulmonary edema with BNP 755,  diuresed.  Discharged on home lasix and DAPT. Since his discharge, he has been doing relatively well.  Has been weighing daily and monitoring heart rate, blood pressure and pulse ox.  Discussed importance of continuing to follow daily weights.  Continue DAPT.  Continue pravastatin.        History of bladder cancer    Continue f/u with urology.       Hypercholesterolemia    Continue pravastatin.  Low cholesterol diet and exercise.  Follow lipid panel and liver function tests.        Hyperglycemia    Low carb diet and exercise.  Follow met b and a1c.       Hypertension, benign    On lasix.  Pressures have been under control.  Follow pressures.  Follow metabolic panel.  Avoid beta blockers given history of MG.        Lambert-Eaton myasthenic syndrome (HCC)    On cellcept. Avoid beta blockers.  Overall appears to be stable. f  ollow.       Venous insufficiency of both lower extremities    Legs have been wrapped intermittently. Follow.  Discussed low sodium diet.  Follow.         [name], MD

## 2022-07-02 ENCOUNTER — Ambulatory Visit: Payer: Medicare Other | Admitting: Occupational Therapy

## 2022-07-04 ENCOUNTER — Encounter: Payer: Medicare Other | Admitting: Occupational Therapy

## 2022-07-06 ENCOUNTER — Encounter: Payer: Self-pay | Admitting: Internal Medicine

## 2022-07-06 NOTE — Assessment & Plan Note (Signed)
On lasix.  Pressures have been under control.  Follow pressures.  Follow metabolic panel.  Avoid beta blockers given history of MG.

## 2022-07-06 NOTE — Assessment & Plan Note (Signed)
Legs have been wrapped intermittently. Follow.  Discussed low sodium diet.  Follow.

## 2022-07-06 NOTE — Assessment & Plan Note (Signed)
Continue f/u with urology  

## 2022-07-06 NOTE — Assessment & Plan Note (Signed)
Continue flomax and proscar.  Stable.

## 2022-07-06 NOTE — Assessment & Plan Note (Signed)
Continue pravastatin.  Low cholesterol diet and exercise.  Follow lipid panel and liver function tests.   

## 2022-07-06 NOTE — Assessment & Plan Note (Signed)
Low carb diet and exercise.  Follow met b and a1c.  

## 2022-07-06 NOTE — Assessment & Plan Note (Signed)
Previously evaluated by Dr Genevive Bi.  Stable.  Recommended f/u prn.

## 2022-07-06 NOTE — Assessment & Plan Note (Signed)
ECHO 09/2021 - EF 30-35%.  Remains on lasix.  Breathing stable currently. Following his weight. Reviewed outside weights.  Stable.  Continue to avoid increased sodium intake.  Follow.

## 2022-07-06 NOTE — Assessment & Plan Note (Signed)
Admitted 06/14/22 - SOB- acute resp failure - Non- STEMI.  Started on BIPAP.  CXR pulmonary edema with BNP 755,  diuresed.  Discharged on home lasix and DAPT. Since his discharge, he has been doing relatively well.  Has been weighing daily and monitoring heart rate, blood pressure and pulse ox.  Discussed importance of continuing to follow daily weights.  Continue DAPT.  Continue pravastatin.

## 2022-07-06 NOTE — Assessment & Plan Note (Signed)
On cellcept. Avoid beta blockers.  Overall appears to be stable. f ollow.

## 2022-07-06 NOTE — Assessment & Plan Note (Signed)
Recheck cbc next labs.

## 2022-07-08 ENCOUNTER — Encounter: Payer: Medicare Other | Admitting: Occupational Therapy

## 2022-07-09 ENCOUNTER — Encounter: Payer: Medicare Other | Admitting: Occupational Therapy

## 2022-07-10 ENCOUNTER — Encounter: Payer: Medicare Other | Admitting: Occupational Therapy

## 2022-07-15 ENCOUNTER — Encounter: Payer: Medicare Other | Admitting: Occupational Therapy

## 2022-07-16 ENCOUNTER — Encounter: Payer: Medicare Other | Admitting: Occupational Therapy

## 2022-07-17 ENCOUNTER — Encounter: Payer: Medicare Other | Admitting: Occupational Therapy

## 2022-07-22 ENCOUNTER — Encounter: Payer: Medicare Other | Admitting: Occupational Therapy

## 2022-07-23 ENCOUNTER — Encounter: Payer: Medicare Other | Admitting: Occupational Therapy

## 2022-07-23 ENCOUNTER — Telehealth: Payer: Self-pay | Admitting: Internal Medicine

## 2022-07-23 NOTE — Telephone Encounter (Signed)
Patient walled into office. He would like Dr Nicki Reaper to cancel his night time oxygen. Oxygen is authorized from 07/07/2022 to 10/06/2024, rental of Portable Oxygen [phone]), company is [employer]. A copy of the letter from [facility] is up front in Dr [employer]. Please call patient when oxygen has been canceled.

## 2022-07-23 NOTE — Telephone Encounter (Signed)
For the oxygen to be able to be cancelled, their has to be documentation that he no longer needs it.  If he is agreeable, will need an overnight oximetry - to confirm what oxygen level is while sleeping.  If he is agreeable, ok to order.  Will need to contact the company who is currently supplying his oxygen and see if they do overnight oximetry.  If so, ok to fax order to them.

## 2022-07-24 ENCOUNTER — Encounter: Payer: Medicare Other | Admitting: Occupational Therapy

## 2022-07-24 NOTE — Telephone Encounter (Signed)
LMTCB

## 2022-07-25 NOTE — Telephone Encounter (Signed)
Pt returning call

## 2022-07-26 DIAGNOSIS — I5043 Acute on chronic combined systolic (congestive) and diastolic (congestive) heart failure: Secondary | ICD-10-CM | POA: Diagnosis not present

## 2022-07-26 DIAGNOSIS — I2510 Atherosclerotic heart disease of native coronary artery without angina pectoris: Secondary | ICD-10-CM | POA: Diagnosis not present

## 2022-07-29 ENCOUNTER — Encounter: Payer: Medicare Other | Admitting: Occupational Therapy

## 2022-07-29 ENCOUNTER — Telehealth: Payer: Self-pay | Admitting: Internal Medicine

## 2022-07-29 NOTE — Telephone Encounter (Signed)
Pt stated he is returning a call to [place]

## 2022-07-30 ENCOUNTER — Ambulatory Visit: Payer: Medicare Other

## 2022-07-30 ENCOUNTER — Encounter: Payer: Medicare Other | Admitting: Occupational Therapy

## 2022-07-30 NOTE — Telephone Encounter (Signed)
Lm for pt to cb.

## 2022-07-31 ENCOUNTER — Telehealth: Payer: Self-pay | Admitting: Internal Medicine

## 2022-07-31 ENCOUNTER — Ambulatory Visit: Payer: Medicare Other | Admitting: Dermatology

## 2022-07-31 ENCOUNTER — Encounter: Payer: Medicare Other | Admitting: Occupational Therapy

## 2022-07-31 NOTE — Telephone Encounter (Signed)
Pt would like to be called regarding his condition and oxygen tanks

## 2022-08-01 NOTE — Telephone Encounter (Signed)
Lm for pt to cb.

## 2022-08-04 ENCOUNTER — Emergency Department: Payer: Medicare Other

## 2022-08-04 ENCOUNTER — Inpatient Hospital Stay
Admission: EM | Admit: 2022-08-04 | Discharge: 2022-08-07 | DRG: 291 | Disposition: A | Discharge status: Home Health | Payer: Medicare Other | Attending: Internal Medicine | Admitting: Internal Medicine

## 2022-08-04 ENCOUNTER — Other Ambulatory Visit: Payer: Self-pay

## 2022-08-04 ENCOUNTER — Encounter: Payer: Self-pay | Admitting: *Deleted

## 2022-08-04 DIAGNOSIS — I5023 Acute on chronic systolic (congestive) heart failure: Secondary | ICD-10-CM | POA: Diagnosis not present

## 2022-08-04 DIAGNOSIS — G7000 Myasthenia gravis without (acute) exacerbation: Secondary | ICD-10-CM | POA: Diagnosis present

## 2022-08-04 DIAGNOSIS — G7080 Lambert-Eaton syndrome, unspecified: Secondary | ICD-10-CM | POA: Diagnosis present

## 2022-08-04 DIAGNOSIS — Z7982 Long term (current) use of aspirin: Secondary | ICD-10-CM | POA: Diagnosis not present

## 2022-08-04 DIAGNOSIS — I493 Ventricular premature depolarization: Secondary | ICD-10-CM | POA: Diagnosis present

## 2022-08-04 DIAGNOSIS — J9601 Acute respiratory failure with hypoxia: Secondary | ICD-10-CM | POA: Diagnosis present

## 2022-08-04 DIAGNOSIS — Z888 Allergy status to other drugs, medicaments and biological substances status: Secondary | ICD-10-CM

## 2022-08-04 DIAGNOSIS — E785 Hyperlipidemia, unspecified: Secondary | ICD-10-CM | POA: Diagnosis not present

## 2022-08-04 DIAGNOSIS — Z7902 Long term (current) use of antithrombotics/antiplatelets: Secondary | ICD-10-CM | POA: Diagnosis not present

## 2022-08-04 DIAGNOSIS — R0689 Other abnormalities of breathing: Secondary | ICD-10-CM | POA: Diagnosis not present

## 2022-08-04 DIAGNOSIS — I110 Hypertensive heart disease with heart failure: Secondary | ICD-10-CM | POA: Diagnosis not present

## 2022-08-04 DIAGNOSIS — Z85828 Personal history of other malignant neoplasm of skin: Secondary | ICD-10-CM

## 2022-08-04 DIAGNOSIS — I509 Heart failure, unspecified: Secondary | ICD-10-CM | POA: Diagnosis not present

## 2022-08-04 DIAGNOSIS — J9621 Acute and chronic respiratory failure with hypoxia: Secondary | ICD-10-CM | POA: Diagnosis present

## 2022-08-04 DIAGNOSIS — Z1152 Encounter for screening for COVID-19: Secondary | ICD-10-CM

## 2022-08-04 DIAGNOSIS — E7800 Pure hypercholesterolemia, unspecified: Secondary | ICD-10-CM | POA: Diagnosis present

## 2022-08-04 DIAGNOSIS — I2510 Atherosclerotic heart disease of native coronary artery without angina pectoris: Secondary | ICD-10-CM | POA: Diagnosis not present

## 2022-08-04 DIAGNOSIS — R001 Bradycardia, unspecified: Secondary | ICD-10-CM | POA: Diagnosis not present

## 2022-08-04 DIAGNOSIS — J90 Pleural effusion, not elsewhere classified: Secondary | ICD-10-CM | POA: Diagnosis not present

## 2022-08-04 DIAGNOSIS — Z79899 Other long term (current) drug therapy: Secondary | ICD-10-CM | POA: Diagnosis not present

## 2022-08-04 DIAGNOSIS — Z8551 Personal history of malignant neoplasm of bladder: Secondary | ICD-10-CM | POA: Diagnosis not present

## 2022-08-04 DIAGNOSIS — Z881 Allergy status to other antibiotic agents status: Secondary | ICD-10-CM | POA: Diagnosis not present

## 2022-08-04 DIAGNOSIS — J45909 Unspecified asthma, uncomplicated: Secondary | ICD-10-CM | POA: Diagnosis not present

## 2022-08-04 DIAGNOSIS — Z882 Allergy status to sulfonamides status: Secondary | ICD-10-CM | POA: Diagnosis not present

## 2022-08-04 DIAGNOSIS — N400 Enlarged prostate without lower urinary tract symptoms: Secondary | ICD-10-CM | POA: Diagnosis present

## 2022-08-04 DIAGNOSIS — Z87891 Personal history of nicotine dependence: Secondary | ICD-10-CM

## 2022-08-04 DIAGNOSIS — I252 Old myocardial infarction: Secondary | ICD-10-CM

## 2022-08-04 DIAGNOSIS — I2489 Other forms of acute ischemic heart disease: Secondary | ICD-10-CM | POA: Diagnosis not present

## 2022-08-04 DIAGNOSIS — J811 Chronic pulmonary edema: Secondary | ICD-10-CM | POA: Diagnosis not present

## 2022-08-04 DIAGNOSIS — Z79624 Long term (current) use of inhibitors of nucleotide synthesis: Secondary | ICD-10-CM | POA: Diagnosis not present

## 2022-08-04 DIAGNOSIS — R0602 Shortness of breath: Secondary | ICD-10-CM | POA: Diagnosis not present

## 2022-08-04 DIAGNOSIS — R0902 Hypoxemia: Secondary | ICD-10-CM | POA: Diagnosis not present

## 2022-08-04 LAB — COMPREHENSIVE METABOLIC PANEL
ALT: 25 U/L (ref 0–44)
AST: 23 U/L (ref 15–41)
Albumin: 4.3 g/dL (ref 3.5–5.0)
Alkaline Phosphatase: 88 U/L (ref 38–126)
Anion gap: 9 (ref 5–15)
BUN: 23 mg/dL (ref 8–23)
CO2: 31 mmol/L (ref 22–32)
Calcium: 9.3 mg/dL (ref 8.9–10.3)
Chloride: 103 mmol/L (ref 98–111)
Creatinine, Ser: 0.98 mg/dL (ref 0.61–1.24)
GFR, Estimated: >60 mL/min (ref >60)
Glucose, Bld: 119 mg/dL — ABNORMAL HIGH (ref 70–99)
Potassium: 3.9 mmol/L (ref 3.5–5.1)
Sodium: 143 mmol/L (ref 135–145)
Total Bilirubin: 1.6 mg/dL — ABNORMAL HIGH (ref 0.3–1.2)
Total Protein: 7.3 g/dL (ref 6.5–8.1)

## 2022-08-04 LAB — BLOOD GAS, VENOUS
Acid-Base Excess: 7 mmol/L — ABNORMAL HIGH (ref 0.0–2.0)
Bicarbonate: 35.3 mmol/L — ABNORMAL HIGH (ref 20.0–28.0)
O2 Saturation: 91.2 %
Patient temperature: 37
pCO2, Ven: 67 mmHg — ABNORMAL HIGH (ref 44–60)
pH, Ven: 7.33 (ref 7.25–7.43)
pO2, Ven: 61 mmHg — ABNORMAL HIGH (ref 32–45)

## 2022-08-04 LAB — CBC WITH DIFFERENTIAL/PLATELET
Abs Immature Granulocytes: 0.04 10*3/uL (ref 0.00–0.07)
Basophils Absolute: 0 10*3/uL (ref 0.0–0.1)
Basophils Relative: 0 %
Eosinophils Absolute: 0 10*3/uL (ref 0.0–0.5)
Eosinophils Relative: 0 %
HCT: 43.1 % (ref 39.0–52.0)
Hemoglobin: 13.1 g/dL (ref 13.0–17.0)
Immature Granulocytes: 1 %
Lymphocytes Relative: 7 %
Lymphs Abs: 0.5 10*3/uL — ABNORMAL LOW (ref 0.7–4.0)
MCH: 28.8 pg (ref 26.0–34.0)
MCHC: 30.4 g/dL (ref 30.0–36.0)
MCV: 94.7 fL (ref 80.0–100.0)
Monocytes Absolute: 0.7 10*3/uL (ref 0.1–1.0)
Monocytes Relative: 8 %
Neutro Abs: 6.9 10*3/uL (ref 1.7–7.7)
Neutrophils Relative %: 84 %
Platelets: 179 10*3/uL (ref 150–400)
RBC: 4.55 MIL/uL (ref 4.22–5.81)
RDW: 15.8 % — ABNORMAL HIGH (ref 11.5–15.5)
WBC: 8.1 10*3/uL (ref 4.0–10.5)
nRBC: 0 % (ref 0.0–0.2)

## 2022-08-04 LAB — SARS CORONAVIRUS 2 BY RT PCR: SARS Coronavirus 2 by RT PCR: NEGATIVE

## 2022-08-04 LAB — BRAIN NATRIURETIC PEPTIDE: B Natriuretic Peptide: 1547.5 pg/mL — ABNORMAL HIGH (ref 0.0–100.0)

## 2022-08-04 LAB — TROPONIN I (HIGH SENSITIVITY)
Troponin I (High Sensitivity): 58 ng/L — ABNORMAL HIGH (ref <18)
Troponin I (High Sensitivity): 64 ng/L — ABNORMAL HIGH (ref <18)

## 2022-08-04 LAB — PROCALCITONIN: Procalcitonin: <0.1 ng/mL

## 2022-08-04 MED ORDER — PYRIDOSTIGMINE BROMIDE 60 MG PO TABS: 30.0000 mg | ORAL_TABLET | Freq: Every day | ORAL | Status: DC

## 2022-08-04 MED ORDER — ACETAMINOPHEN 325 MG PO TABS
650.0000 mg | ORAL_TABLET | Freq: Four times a day (QID) | ORAL | Status: DC | PRN
  Administered 2022-08-06 (×2): 650 mg via ORAL
  Filled 2022-08-04 (×2): qty 2

## 2022-08-04 MED ORDER — MAGNESIUM HYDROXIDE 400 MG/5ML PO SUSP: 30.0000 mL | Freq: Every day | ORAL | Status: DC | PRN

## 2022-08-04 MED ORDER — ASPIRIN 81 MG PO TBEC
81.0000 mg | DELAYED_RELEASE_TABLET | Freq: Every day | ORAL | Status: DC
  Administered 2022-08-05 – 2022-08-07 (×3): 81 mg via ORAL
  Filled 2022-08-04 (×3): qty 1

## 2022-08-04 MED ORDER — ONDANSETRON HCL 4 MG PO TABS: 4.0000 mg | ORAL_TABLET | Freq: Four times a day (QID) | ORAL | Status: DC | PRN

## 2022-08-04 MED ORDER — FUROSEMIDE 10 MG/ML IJ SOLN
40.0000 mg | Freq: Once | INTRAMUSCULAR | Status: AC
  Administered 2022-08-04: 40 mg via INTRAVENOUS
  Filled 2022-08-04: qty 4

## 2022-08-04 MED ORDER — DOCUSATE SODIUM 100 MG PO CAPS: 100.0000 mg | ORAL_CAPSULE | Freq: Two times a day (BID) | ORAL | Status: DC | PRN

## 2022-08-04 MED ORDER — TAMSULOSIN HCL 0.4 MG PO CAPS
0.4000 mg | ORAL_CAPSULE | Freq: Every day | ORAL | Status: DC
  Administered 2022-08-05 – 2022-08-07 (×3): 0.4 mg via ORAL
  Filled 2022-08-04 (×3): qty 1

## 2022-08-04 MED ORDER — TACROLIMUS 0.1 % EX OINT: TOPICAL_OINTMENT | Freq: Two times a day (BID) | CUTANEOUS | Status: DC

## 2022-08-04 MED ORDER — CLOPIDOGREL BISULFATE 75 MG PO TABS
75.0000 mg | ORAL_TABLET | Freq: Every day | ORAL | Status: DC
  Administered 2022-08-05 – 2022-08-07 (×3): 75 mg via ORAL
  Filled 2022-08-04 (×3): qty 1

## 2022-08-04 MED ORDER — PRAVASTATIN SODIUM 20 MG PO TABS
20.0000 mg | ORAL_TABLET | Freq: Every day | ORAL | Status: DC
  Administered 2022-08-05 – 2022-08-07 (×3): 20 mg via ORAL
  Filled 2022-08-04 (×3): qty 1

## 2022-08-04 MED ORDER — FUROSEMIDE 10 MG/ML IJ SOLN
40.0000 mg | Freq: Two times a day (BID) | INTRAMUSCULAR | Status: DC
  Administered 2022-08-05 – 2022-08-06 (×3): 40 mg via INTRAVENOUS
  Filled 2022-08-04 (×3): qty 4

## 2022-08-04 MED ORDER — PYRIDOSTIGMINE BROMIDE 60 MG PO TABS
30.0000 mg | ORAL_TABLET | ORAL | Status: DC
  Administered 2022-08-04: 30 mg via ORAL
  Filled 2022-08-04 (×4): qty 0.5

## 2022-08-04 MED ORDER — FINASTERIDE 5 MG PO TABS
5.0000 mg | ORAL_TABLET | Freq: Every day | ORAL | Status: DC
  Administered 2022-08-05 – 2022-08-07 (×3): 5 mg via ORAL
  Filled 2022-08-04 (×3): qty 1

## 2022-08-04 MED ORDER — ENOXAPARIN SODIUM 40 MG/0.4ML IJ SOSY
40.0000 mg | PREFILLED_SYRINGE | INTRAMUSCULAR | Status: DC
  Administered 2022-08-05 – 2022-08-06 (×3): 40 mg via SUBCUTANEOUS
  Filled 2022-08-04 (×3): qty 0.4

## 2022-08-04 MED ORDER — ACETAMINOPHEN 650 MG RE SUPP: 650.0000 mg | Freq: Four times a day (QID) | RECTAL | Status: DC | PRN

## 2022-08-04 MED ORDER — POLYVINYL ALCOHOL 1.4 % OP SOLN
1.0000 [drp] | Freq: Every day | OPHTHALMIC | Status: DC
  Filled 2022-08-04: qty 15

## 2022-08-04 MED ORDER — MYCOPHENOLATE MOFETIL 250 MG PO CAPS
1000.0000 mg | ORAL_CAPSULE | Freq: Two times a day (BID) | ORAL | Status: DC
  Administered 2022-08-04 – 2022-08-07 (×6): 1000 mg via ORAL
  Filled 2022-08-04 (×7): qty 4

## 2022-08-04 MED ORDER — MYCOPHENOLATE MOFETIL 250 MG PO CAPS: 1000.0000 mg | ORAL_CAPSULE | Freq: Two times a day (BID) | ORAL | Status: DC

## 2022-08-04 MED ORDER — POLYETHYLENE GLYCOL 3350 17 G PO PACK: 17.0000 g | PACK | Freq: Every day | ORAL | Status: DC | PRN

## 2022-08-04 MED ORDER — ONDANSETRON HCL 4 MG/2ML IJ SOLN: 4.0000 mg | Freq: Four times a day (QID) | INTRAMUSCULAR | Status: DC | PRN

## 2022-08-04 MED ORDER — TRAZODONE HCL 50 MG PO TABS
25.0000 mg | ORAL_TABLET | Freq: Every evening | ORAL | Status: DC | PRN
  Administered 2022-08-06 (×2): 25 mg via ORAL
  Filled 2022-08-04 (×2): qty 1

## 2022-08-04 NOTE — ED Provider Notes (Signed)
[facility] Provider Note    Event Date/Time   First MD Initiated Contact with Patient 08/04/22 1845     (approximate)   History   Shortness of Breath   HPI  [name] is a 88 y.o. male  here with SOB. Pt reports that over the past 24 hours he has had acute SOB worse with any exertion, lying flat. Pt has a h/o CHF as well as myasthenia. He started by gaining 2-3 lb over the past day or so. He went to a sports event yesterday and exerted himself much more than usual. At the event he became acutely SOB and this persisted. He checked his home o2 and it was in the 80s so he has been using his home O2 since then, which is also more than usual. Today, whenever he moved his sats droped to 70s so he arrives for evaluation. Reports significant SOB worse with exertion and lying flat. No chest pain.       Physical Exam   Triage Vital Signs: ED Triage Vitals  Enc Vitals Group     BP 08/04/22 1659 130/71     Pulse Rate 08/04/22 1659 75     Resp 08/04/22 1659 (!) 22     Temp 08/04/22 1659 97.9 F (36.6 C)     Temp Source 08/04/22 1659 Oral     SpO2 08/04/22 1659 91 %     Weight 08/04/22 1657 190 lb (86.2 kg)     Height 08/04/22 1657 '5\' 10"'$  (1.778 m)     Head Circumference --      Peak Flow --      Pain Score 08/04/22 1657 0     Pain Loc --      Pain Edu? --      Excl. in [place]? --     Most recent vital signs: Vitals:   08/04/22 2053 08/04/22 2100  BP:  (!) 140/60  Pulse: 79 77  Resp: (!) 25 (!) 22  Temp:    SpO2: 98% 98%     General: Awake, mild resp distress. CV:  Good peripheral perfusion. Regular rate and rhythm. Resp:  Increased WOB with tachypnea. Bilateral rales to mid lung fields. Abd:  No distention. No tenderness. Other:  1+ pitting edema bilaterally   ED Results / Procedures / Treatments   Labs (all labs ordered are listed, but only abnormal results are displayed) Labs Reviewed  BRAIN NATRIURETIC PEPTIDE - Abnormal; Notable for  the following components:      Result Value   B Natriuretic Peptide 1,547.5 (*)    All other components within normal limits  CBC WITH DIFFERENTIAL/PLATELET - Abnormal; Notable for the following components:   RDW 15.8 (*)    Lymphs Abs 0.5 (*)    All other components within normal limits  COMPREHENSIVE METABOLIC PANEL - Abnormal; Notable for the following components:   Glucose, Bld 119 (*)    Total Bilirubin 1.6 (*)    All other components within normal limits  BLOOD GAS, VENOUS - Abnormal; Notable for the following components:   pCO2, Ven 67 (*)    pO2, Ven 61 (*)    Bicarbonate 35.3 (*)    Acid-Base Excess 7.0 (*)    All other components within normal limits  TROPONIN I (HIGH SENSITIVITY) - Abnormal; Notable for the following components:   Troponin I (High Sensitivity) 58 (*)    All other components within normal limits  TROPONIN I (HIGH SENSITIVITY) - Abnormal;  Notable for the following components:   Troponin I (High Sensitivity) 64 (*)    All other components within normal limits  SARS CORONAVIRUS 2 BY RT PCR  PROCALCITONIN  BASIC METABOLIC PANEL  CBC     EKG Sinus rhythm with PVCs, LBBB. VR 78, PR 202, QRS 104, QTc 435. No acute St elevations or depressions. No ischemia or infarct.   RADIOLOGY CXR: Cardiomegaly with central congestion and pulmonary edema   I also independently reviewed and agree with radiologist interpretations.   PROCEDURES:  Critical Care performed: Yes, see critical care procedure note(s)  .Critical Care  Performed by: Duffy Bruce, MD Authorized by: Duffy Bruce, MD   Critical care provider statement:    Critical care time (minutes):  30   Critical care time was exclusive of:  Separately billable procedures and treating other patients   Critical care was necessary to treat or prevent imminent or life-threatening deterioration of the following conditions:  Respiratory failure   Critical care was time spent personally by me on the  following activities:  Development of treatment plan with patient or surrogate, discussions with consultants, evaluation of patient's response to treatment, examination of patient, ordering and review of laboratory studies, ordering and review of radiographic studies, ordering and performing treatments and interventions, pulse oximetry, re-evaluation of patient's condition and review of old charts    MEDICATIONS ORDERED IN ED: Medications  pyridostigmine (MESTINON) tablet 30 mg (30 mg Oral Given 08/04/22 2042)  mycophenolate (CELLCEPT) capsule 1,000 mg (1,000 mg Oral Given 08/04/22 2043)  aspirin EC tablet 81 mg (has no administration in time range)  pravastatin (PRAVACHOL) tablet 20 mg (has no administration in time range)  docusate sodium (COLACE) capsule 100 mg (has no administration in time range)  polyethylene glycol (MIRALAX / GLYCOLAX) packet 17 g (has no administration in time range)  finasteride (PROSCAR) tablet 5 mg (has no administration in time range)  tamsulosin (FLOMAX) capsule 0.4 mg (has no administration in time range)  clopidogrel (PLAVIX) tablet 75 mg (has no administration in time range)  polyvinyl alcohol (LIQUIFILM TEARS) 1.4 % ophthalmic solution 1 drop (has no administration in time range)  tacrolimus (PROTOPIC) 0.1 % ointment (has no administration in time range)  enoxaparin (LOVENOX) injection 40 mg (has no administration in time range)  acetaminophen (TYLENOL) tablet 650 mg (has no administration in time range)    Or  acetaminophen (TYLENOL) suppository 650 mg (has no administration in time range)  traZODone (DESYREL) tablet 25 mg (has no administration in time range)  magnesium hydroxide (MILK OF MAGNESIA) suspension 30 mL (has no administration in time range)  ondansetron (ZOFRAN) tablet 4 mg (has no administration in time range)    Or  ondansetron (ZOFRAN) injection 4 mg (has no administration in time range)  furosemide (LASIX) injection 40 mg (has no  administration in time range)  furosemide (LASIX) injection 40 mg (40 mg Intravenous Given 08/04/22 1942)     IMPRESSION / MDM / [place] / ED COURSE  I reviewed the triage vital signs and the nursing notes.                               This patient presents to the ED for concern of SOB, this involves an extensive number of treatment options, and is a complaint that carries with it a high risk of complications and morbidity.  The differential diagnosis includes CHF exacerbation, PNA, ACS, PE, PTX, myasthenia/LES  crisis, anemia   Co morbidities that complicate the patient evaluation  CHF Myasthenia [name]   Additional history obtained:  Additional history obtained from daughter External records from outside source obtained and reviewed including PCP notes, prior admission for CHF   Lab Tests:  I Ordered, and personally interpreted labs.  The pertinent results include:   Blood gas with pH 7.33, pCO2 67, pO2 61 (venous) Trop 64, likely demand, EKG nonischemic BNP>1500 CBC without leukocytosis or anemia CMP at baseline   Imaging Studies ordered:  I ordered imaging studies including CXR  I independently visualized and interpreted imaging which showed: Bilateral pulm edema and cardiomegaly I agree with the radiologist interpretation   Cardiac Monitoring: / EKG:  The patient was maintained on a cardiac monitor.  I personally viewed and interpreted the cardiac monitored which showed an underlying rhythm of: NSR   Problem List / ED Course / Critical interventions / Medication management  CHF exacerbation: BNP>1500, exam c/w CHF and pt has had several lb weight gain Moderate tachypnea noted, placed on BIPAP for WOB esp In setting of myasthenia IV lasix given, BP is at goal Admit to medicine No signs of PNA, infection No signs of ischemia, trop likely elevated 2/2 demand No evidence of MG/LE exacerbation but will need to be monitored I ordered  medication including IV lasix for CHF, cellcept/mestinon for MG/LES Reevaluation of the patient after these medicines showed that the patient improved I have reviewed the patients home medicines and have made adjustments as needed   Social Determinants of Health:  NA   Test / Admission - Considered:  Admit to Hospitalist   FINAL CLINICAL IMPRESSION(S) / ED DIAGNOSES   Final diagnoses:  Acute on chronic congestive heart failure, unspecified heart failure type ([place])  Acute on chronic hypoxic respiratory failure ([place])     Rx / DC Orders   ED Discharge Orders     None        Note:  This document was prepared using Dragon voice recognition software and may include unintentional dictation errors.   Duffy Bruce, MD 08/04/22 [phone]

## 2022-08-04 NOTE — ED Triage Notes (Signed)
First nurse note. Patient arrived by EMS From home c/o SOB. Reports MI X3 months ago. A&O x4 per EMS  EMS vitals: 92% RA for EMS and placed on 2L O2 94% 74P 24RR 126/70 b/p

## 2022-08-04 NOTE — Assessment & Plan Note (Signed)
-   The patient will be admitted to a stepdown unit bed for close monitoring specially given history of myasthenia gravis and Eaton-Lambert syndrome. - We will continue him on BiPAP. - O2 protocol will be followed. - This is likely secondary to his acute on chronic systolic CHF.

## 2022-08-04 NOTE — ED Notes (Signed)
Patient in [place]

## 2022-08-04 NOTE — H&P (Incomplete)
[place]   PATIENT NAME: [name]    MR#:  [id number]  DATE OF BIRTH:  10/18/1925  DATE OF ADMISSION:  08/04/2022  PRIMARY CARE PHYSICIAN: [name], MD   Patient is coming from: Home  REQUESTING/REFERRING PHYSICIAN: Duffy Bruce, MD  CHIEF COMPLAINT:   Chief Complaint  Patient presents with   Shortness of Breath    HISTORY OF PRESENT ILLNESS:  [name] is a 88 y.o. Caucasian male with medical history significant for asthma, coronary artery disease, hypertension, dyslipidemia, Lambert-Eaton myasthenic syndrome presented to the emergency room with acute onset of worsening dyspnea for the last couple of days with associated weight gain of 4 pounds over the last 3 days without significant cough or wheezing.  He did not have any worsening lower extremity edema.  He has been having dyspnea on exertion.  He was at an outside sports event yesterday and did not take his Lasix to avoid urinary frequency.  No chest pain or palpitations.  No nausea or vomiting or abdominal pain.  No dysuria, oliguria or hematuria or flank pain.  ED Course: When he came to the ER, respiratory it was 22 and pulse 70 was 91% on 3 L of O2 by nasal cannula later 97%.  He was in worsening respiratory distress requiring BiPAP with 45% FiO2 and pulse extremity was up to 98% on BiPAP.  Labs revealed unremarkable CMP except for total bili 1.6.  BNP was significantly elevated at 1547.5 and high sensitive troponin was 58.  CBC was within normal.  Blood gas showed a pH of 7.33, PCO2 67 and PO2 of 61 with HCO3 of 35.3 and O2 sat of 91% on initial 3 L of O2 by nasal cannula. EKG as reviewed by me : EKG showed sinus rhythm with a rate of 78 with occasional PVCs, Q waves anteroseptally and T wave inversion laterally. Imaging: 2 view chest x-ray showed cardiomegaly with central congestion and mild interstitial pulmonary edema with small bilateral pleural effusions and bibasal atelectasis or  pneumonia.  The patient was given 40 mg of IV Lasix, his CellCept and Mestinon and was placed on BiPAP as mentioned above.  He will be admitted to a stepdown unit bed for further evaluation and management. PAST MEDICAL HISTORY:   Past Medical History:  Diagnosis Date   Abnormal chest CT 03/28/2017   Allergy    Anemia    Angina pectoris ([place]) 04/02/2011   Aortic aneurysm ([place]) 03/28/2017   Saw Dr Genevive Bi 04/16/17 - recommended f/u CT in 3 months.     Atherosclerosis of both carotid arteries 07/17/2014   Basal cell carcinoma 06/25/2021   Left ant neck. Nodulocystic pattern, close to margin   Benign lipomatous neoplasm of skin and subcutaneous tissue of left arm 10/04/2013   Bilateral carotid artery stenosis 08/30/2014   Overview:  Less than 50% 2015   Bladder cancer ([place]) 03/05/2013   CAD (coronary artery disease) 06/18/2014   Cancer ([place]) 11/24/2012   bladder. BCG treatments by Dr Jacqlyn Larsen.   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 WD SCC. Re-shaved 01/14/2016. Excised 02/05/2016, margins free.   Squamous cell carcinoma of skin 11/03/2018   Right cheek ant. to sideburn. Poorly differentiated.   Squamous cell carcinoma of skin 02/02/2019    Right mid dorsum forearm. WD SCC. EDC.    PAST SURGICAL HISTORY:   Past Surgical History:  Procedure Laterality Date   [place]   TONSILLECTOMY  1958    SOCIAL HISTORY:   Social History   Tobacco Use   Smoking status: Former    Types: Cigarettes    Quit date: 09/29/1958    Years since quitting: 63.8   Smokeless tobacco: Never  Substance Use Topics   Alcohol use: Yes    Alcohol/week: 0.0 standard drinks of alcohol    Comment: occasionally    FAMILY HISTORY:   Family History  Problem Relation Age of Onset   Lung cancer Sister    Prostate cancer Brother    Lung cancer Brother    Arthritis Other        parent   Colon cancer Neg Hx     DRUG ALLERGIES:   Allergies  Allergen Reactions   Beta Adrenergic Blockers Other (See Comments)    Beta Blocker will precipitate acute weakness in Lambert-Eaton Syndrome or Myasthenia Gravis   Calcium Channel Blockers Other (See Comments)    Calcium Channel Blocker- will precipitate acute weakness in Lambert-Eaton Syndrome.   Diltiazem Other (See Comments)   Ciprofloxacin Other (See Comments)    Last resort due to Myasthia Gravis   Sulfa Antibiotics Rash    REVIEW OF SYSTEMS:   ROS As per history of present illness. All pertinent systems were reviewed above. Constitutional, HEENT, cardiovascular, respiratory, GI, GU, musculoskeletal, neuro, psychiatric, endocrine, integumentary and hematologic systems were reviewed and are otherwise negative/unremarkable except for positive findings mentioned above in the HPI.   MEDICATIONS AT HOME:   Prior to Admission medications   Medication Sig Start Date End Date Taking? Authorizing Provider  Acetaminophen (ARTHRITIS PAIN PO) Take 650 mg by mouth.   Yes [provider]  aspirin 81 MG tablet Take 81 mg by mouth daily.   Yes [provider]  clopidogrel (PLAVIX) 75 MG tablet Take 1 tablet (75 mg total) by mouth  daily. Total at least 6 mos therapy w/ clopidogrel + aspirin 06/19/22  Yes Emeterio Reeve, DO  docusate sodium (COLACE) 100 MG capsule Take 1 capsule (100 mg total) by mouth 2 (two) times daily as needed for mild constipation. 06/18/22  Yes Emeterio Reeve, DO  finasteride (PROSCAR) 5 MG tablet Take 5 mg by mouth daily.   Yes [provider]  furosemide (LASIX) 40 MG tablet Take 1 tablet (40 mg total) by mouth daily. Patient taking differently: Take 40 mg by mouth daily. 04/03/20 08/04/22 Yes Hackney, Otila Kluver A, FNP  hydroxypropyl methylcellulose / hypromellose (ISOPTO TEARS / GONIOVISC) 2.5 % ophthalmic solution Place 1 drop into both eyes daily.   Yes [provider]  mycophenolate (CELLCEPT) 250 MG capsule Take 1,000 mg by mouth 2 (two) times daily.    Yes [provider]  pravastatin (PRAVACHOL) 20 MG tablet Take 1 tablet (20 mg total) by mouth daily. 07/27/14  Yes [name], MD  pyridostigmine (MESTINON) 60 MG tablet Take 30 mg by mouth 6 (six) times daily.    Yes [provider]  tacrolimus (PROTOPIC) 0.1 % ointment Apply topically 2 (two) times daily. 07/29/21  Yes Ralene Bathe, MD  tamsulosin (FLOMAX) 0.4 MG CAPS Take 0.4 mg by mouth daily.   Yes [provider]  polyethylene glycol (MIRALAX / GLYCOLAX) 17 g packet Take 17 g by mouth daily as needed for moderate constipation. 06/18/22   Emeterio Reeve, DO      VITAL SIGNS:  Blood pressure 114/60, pulse 73, temperature 98.8 F (37.1 C), temperature source Oral, resp. rate 19, height '5\' 10"'$  (1.778 m), weight 86.2 kg, SpO2 100 %.  PHYSICAL EXAMINATION:  Physical Exam  GENERAL: Acutely ill 88 y.o.-year-old patient lying in the bed with mild respiratory distress on BiPAP. EYES: Pupils equal, round, reactive to light and accommodation. No scleral icterus. Extraocular muscles intact.  HEENT: Head atraumatic, normocephalic. Oropharynx and nasopharynx clear.  NECK:  Supple, no jugular  venous distention. No thyroid enlargement, no tenderness.  LUNGS: Diminished bibasilar breath sounds with bibasal rales.  CARDIOVASCULAR: Regular rate and rhythm, S1, S2 normal. No murmurs, rubs, or gallops.  ABDOMEN: Soft, nondistended, nontender. Bowel sounds present. No organomegaly or mass.  EXTREMITIES: 1+ bilateral lower extremity pitting edema with no cyanosis, or clubbing.  NEUROLOGIC: Cranial nerves II through XII are intact. Muscle strength 5/5 in all extremities. Sensation intact. Gait not checked.  PSYCHIATRIC: The patient is alert and oriented x 3.  Normal affect and good eye contact. SKIN: No obvious rash, lesion, or ulcer.   LABORATORY PANEL:   CBC Recent Labs  Lab 08/04/22 1705  WBC 8.1  HGB 13.1  HCT 43.1  PLT 179   ------------------------------------------------------------------------------------------------------------------  Chemistries  Recent Labs  Lab 08/04/22 1705  NA 143  K 3.9  CL 103  CO2 31  GLUCOSE 119*  BUN 23  CREATININE 0.98  CALCIUM 9.3  AST 23  ALT 25  ALKPHOS 88  BILITOT 1.6*   ------------------------------------------------------------------------------------------------------------------  Cardiac Enzymes No results for input(s): "TROPONINI" in the last 168 hours. ------------------------------------------------------------------------------------------------------------------  RADIOLOGY:  DG Chest 2 View  Result Date: 08/04/2022 CLINICAL DATA:  Shortness of breath EXAM: CHEST - 2 VIEW COMPARISON:  06/15/2022 FINDINGS: Small bilateral pleural effusions. Cardiomegaly with central congestion and mild diffuse interstitial pulmonary edema. Bibasilar partial consolidations. Aortic atherosclerosis. No pneumothorax IMPRESSION: Cardiomegaly with central congestion and mild interstitial pulmonary edema. Small bilateral pleural effusions with bibasilar atelectasis or pneumonia. Electronically Signed   By: Donavan Foil M.D.   On: 08/04/2022  17:28      IMPRESSION AND PLAN:  Assessment and Plan: * Acute on chronic hypoxic respiratory failure (HCC) - The patient will be admitted to a stepdown unit bed for close monitoring specially given history of myasthenia gravis and Eaton-Lambert syndrome. - We will continue him on BiPAP. - O2 protocol will be followed. - This is likely secondary to his acute on chronic systolic CHF.  Acute on chronic systolic CHF (congestive heart failure) ([place]) - The patient will be reached with IV Lasix. - We will follow serial troponins. - Cardiology consult to be obtained. - I notified Dr. Saralyn Pilar about patient.  Lambert-Eaton myasthenic syndrome (HCC) - We will continue CellCept and pyridostigmine.  Dyslipidemia - We will continue statin therapy.  Coronary artery disease involving native coronary artery  of native heart without angina pectoris - We will continue aspirin and Plavix as well as statin therapy.  Benign prostatic hyperplasia - We will continue Flomax.    DVT prophylaxis: Lovenox.  Advanced Care Planning:  Code Status: full code.  Family Communication:  The plan of care was discussed in details with the patient (and family). I answered all questions. The patient agreed to proceed with the above mentioned plan. Further management will depend upon hospital course. Disposition Plan: Back to previous home environment Consults called: Cardiology. All the records are reviewed and case discussed with ED provider.  Status is: Inpatient   At the time of the admission, it appears that the appropriate admission status for this patient is inpatient.  This is judged to be reasonable and necessary in order to provide the required intensity of service to ensure the patient's safety given the presenting symptoms, physical exam findings and initial radiographic and laboratory data in the context of comorbid conditions.  The patient requires inpatient status due to high intensity of service,  high risk of further deterioration and high frequency of surveillance required.  I certify that at the time of admission, it is my clinical judgment that the patient will require inpatient hospital care extending more than 2 midnights.                            Dispo: The patient is from: Home              Anticipated d/c is to: Home              Patient currently is not medically stable to d/c.              Difficult to place patient: No  Authorized and performed by: Eugenie Norrie, MD Total critical care time: Approximately  55     minutes. Due to a high probability of clinically significant, life-threatening deterioration, the patient required my highest level of preparedness to intervene emergently and I personally spent this critical care time directly and personally managing the patient.  This critical care time included obtaining a history, examining the patient, pulse oximetry, ordering and review of studies, arranging urgent treatment with development of management plan, evaluation of patient's response to treatment, frequent reassessment, and discussions with other providers. This critical care time was performed to assess and manage the high probability of imminent, life-threatening deterioration that could result in multiorgan failure.  It was exclusive of separately billable procedures and treating other patients and teaching time.    Christel Mormon M.D on 08/05/2022 at 12:12 AM  [facility]   From 7 PM-7 AM, contact night-coverage [email or web address]  CC: Primary care physician; [name], MD

## 2022-08-04 NOTE — ED Provider Triage Note (Signed)
Emergency Medicine Provider Triage Evaluation Note  CHORD TAKAHASHI , a 88 y.o. male  was evaluated in triage.  Pt complains of worsening shortness of breath since yesterday. Chronically on 2.5L O2 at home. He denies chest pain, fever, nausea, vomiting, or recent injury.  Physical Exam  There were no vitals taken for this visit. Gen:   Awake, no distress   Resp:  Normal effort  Completing full sentences. MSK:   Moves extremities without difficulty  Other:  Wearing compression stockings.   Medical Decision Making  Medically screening exam initiated at 4:57 PM.  Appropriate orders placed.  EMRIC KOWALEWSKI was informed that the remainder of the evaluation will be completed by another provider, this initial triage assessment does not replace that evaluation, and the importance of remaining in the ED until their evaluation is complete.    Victorino Dike, FNP 08/04/22 1659

## 2022-08-04 NOTE — ED Triage Notes (Signed)
BIB neighbor, daughter on the way, here from home for sob,onset yesterday, endorses h/o CHF, and edema. Denies cough or fever. States is on 3L O2 [place] at home. Wears compression socks.

## 2022-08-04 NOTE — H&P (Incomplete)
PATIENT NAME: [name]    MR#:  [id number]  DATE OF BIRTH:  1925-12-01  DATE OF ADMISSION:  08/04/2022  PRIMARY CARE PHYSICIAN: [name], MD   Patient is coming from: Home  REQUESTING/REFERRING PHYSICIAN: Duffy Bruce, MD  CHIEF COMPLAINT:   Chief Complaint  Patient presents with  . Shortness of Breath    HISTORY OF PRESENT ILLNESS:  [name] is a 88 y.o. Caucasian male with medical history significant for asthma, coronary artery disease, hypertension, dyslipidemia, Eaton-Lambert syndrome and myasthenia gravis, presented to the emergency room with acute onset of worsening dyspnea for the last couple of days with associated weight gain of 4 pounds over the last 3 days without significant cough or wheezing.  He did not have any worsening lower extremity edema.  He has been having dyspnea on exertion.  He was at an outside sports event yesterday and did not take his Lasix to avoid urinary frequency.  No chest pain or palpitations.  No nausea or vomiting or abdominal pain.  No dysuria, oliguria or hematuria or flank pain.  ED Course: When he came to the ER, respiratory it was 22 and pulse 70 was 91% on 3 L of O2 by nasal cannula later 97%.  He was in worsening respiratory distress requiring BiPAP with 45% FiO2 and pulse extremity was up to 98% on BiPAP.  Labs revealed unremarkable CMP except for total bili 1.6.  BNP was significantly elevated at 1547.5 and high sensitive troponin was 58.  CBC was within normal.  Blood gas showed a pH of 7.33, PCO2 67 and PO2 of 61 with HCO3 of 35.3 and O2 sat of 91% on initial 3 L of O2 by nasal cannula. EKG as reviewed by me : EKG showed sinus rhythm with a rate of 78 with occasional PVCs, Q waves anteroseptally and T wave inversion laterally. Imaging: 2 view chest x-ray showed cardiomegaly with central congestion and mild interstitial pulmonary edema with small bilateral pleural effusions and bibasal atelectasis or  pneumonia.  The patient was given 40 mg of IV Lasix, his CellCept and Mestinon and was placed on BiPAP as mentioned above.  He will be admitted to a stepdown unit bed for further evaluation and management. PAST MEDICAL HISTORY:   Past Medical History:  Diagnosis Date  . Abnormal chest CT 03/28/2017  . Allergy   . Anemia   . Angina pectoris ([place]) 04/02/2011  . Aortic aneurysm ([place]) 03/28/2017   Saw Dr Genevive Bi 04/16/17 - recommended f/u CT in 3 months.    . Atherosclerosis of both carotid arteries 07/17/2014  . Basal cell carcinoma 06/25/2021   Left ant neck. Nodulocystic pattern, close to margin  . Benign lipomatous neoplasm of skin and subcutaneous tissue of left arm 10/04/2013  . Bilateral carotid artery stenosis 08/30/2014   Overview:  Less than 50% 2015  . Bladder cancer ([place]) 03/05/2013  . CAD (coronary artery disease) 06/18/2014  . Cancer ([place]) 11/24/2012   bladder. BCG treatments by Dr Jacqlyn Larsen.  . Chicken pox   . Chronic prostatitis 09/30/2012  . Colon polyp   . Congestive heart failure ([place]) 04/20/2014   Overview:  Overview:  With anterior mi and moderate lv dyfunction ef 35%  . Dizziness 12/22/2016  . 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 Elevated prostate specific antigen (PSA) 09/30/2012  . Essential hypertension, benign   . Gross hematuria 09/30/2012  . Herpes zoster 11/28/2011  Overview:  with ocular involvement OD  . Hypercholesterolemia   . Hypertension, benign 04/02/2011  . Hypotension   . Moderate mitral insufficiency 05/18/2015  . Moderate tricuspid insufficiency 05/18/2015  . Myasthenia gravis ([place])   . Myasthenia gravis without exacerbation ([place]) 03/31/2011  . Shingles 2013  . Squamous cell carcinoma of skin 11/22/2015   Left cheek. WD SCC. Re-shaved 01/14/2016. Excised 02/05/2016, margins free.  Marland Kitchen Squamous cell carcinoma of skin 11/03/2018   Right cheek ant. to sideburn. Poorly differentiated.  . Squamous cell  carcinoma of skin 02/02/2019   Right mid dorsum forearm. WD SCC. EDC.    PAST SURGICAL HISTORY:   Past Surgical History:  Procedure Laterality Date  . APPENDECTOMY  1958  . CHOLECYSTECTOMY  1979  . HERNIA REPAIR  1970  . TONSILLECTOMY  1958    SOCIAL HISTORY:   Social History   Tobacco Use  . Smoking status: Former    Types: Cigarettes    Quit date: 09/29/1958    Years since quitting: 63.8  . Smokeless tobacco: Never  Substance Use Topics  . Alcohol use: Yes    Alcohol/week: 0.0 standard drinks of alcohol    Comment: occasionally    FAMILY HISTORY:   Family History  Problem Relation Age of Onset  . Lung cancer Sister   . Prostate cancer Brother   . Lung cancer Brother   . Arthritis Other        parent  . Colon cancer Neg Hx     DRUG ALLERGIES:   Allergies  Allergen Reactions  . Beta Adrenergic Blockers Other (See Comments)    Beta Blocker will precipitate acute weakness in Lambert-Eaton Syndrome or Myasthenia Gravis  . Calcium Channel Blockers Other (See Comments)    Calcium Channel Blocker- will precipitate acute weakness in Lambert-Eaton Syndrome.  . Diltiazem Other (See Comments)  . Ciprofloxacin Other (See Comments)    Last resort due to Myasthia Gravis  . Sulfa Antibiotics Rash    REVIEW OF SYSTEMS:   ROS As per history of present illness. All pertinent systems were reviewed above. Constitutional, HEENT, cardiovascular, respiratory, GI, GU, musculoskeletal, neuro, psychiatric, endocrine, integumentary and hematologic systems were reviewed and are otherwise negative/unremarkable except for positive findings mentioned above in the HPI.   MEDICATIONS AT HOME:   Prior to Admission medications   Medication Sig Start Date End Date Taking? Authorizing Provider  Acetaminophen (ARTHRITIS PAIN PO) Take 650 mg by mouth.   Yes [provider]  aspirin 81 MG tablet Take 81 mg by mouth daily.   Yes [provider]  clopidogrel (PLAVIX) 75 MG  tablet Take 1 tablet (75 mg total) by mouth daily. Total at least 6 mos therapy w/ clopidogrel + aspirin 06/19/22  Yes Emeterio Reeve, DO  docusate sodium (COLACE) 100 MG capsule Take 1 capsule (100 mg total) by mouth 2 (two) times daily as needed for mild constipation. 06/18/22  Yes Emeterio Reeve, DO  finasteride (PROSCAR) 5 MG tablet Take 5 mg by mouth daily.   Yes [provider]  furosemide (LASIX) 40 MG tablet Take 1 tablet (40 mg total) by mouth daily. Patient taking differently: Take 40 mg by mouth daily. 04/03/20 08/04/22 Yes Hackney, Otila Kluver A, FNP  hydroxypropyl methylcellulose / hypromellose (ISOPTO TEARS / GONIOVISC) 2.5 % ophthalmic solution Place 1 drop into both eyes daily.   Yes [provider]  mycophenolate (CELLCEPT) 250 MG capsule Take 1,000 mg by mouth 2 (two) times daily.    Yes [provider]  pravastatin (PRAVACHOL) 20 MG tablet Take 1 tablet (20 mg total) by mouth daily. 07/27/14  Yes [name], MD  pyridostigmine (MESTINON) 60 MG tablet Take 30 mg by mouth 6 (six) times daily.    Yes [provider]  tacrolimus (PROTOPIC) 0.1 % ointment Apply topically 2 (two) times daily. 07/29/21  Yes Ralene Bathe, MD  tamsulosin (FLOMAX) 0.4 MG CAPS Take 0.4 mg by mouth daily.   Yes [provider]  polyethylene glycol (MIRALAX / GLYCOLAX) 17 g packet Take 17 g by mouth daily as needed for moderate constipation. 06/18/22   Emeterio Reeve, DO      VITAL SIGNS:  Blood pressure 114/60, pulse 73, temperature 98.8 F (37.1 C), temperature source Oral, resp. rate 19, height '5\' 10"'$  (1.778 m), weight 86.2 kg, SpO2 100 %.  PHYSICAL EXAMINATION:  Physical Exam  GENERAL: Acutely ill 88 y.o.-year-old patient lying in the bed with mild respiratory distress on BiPAP. EYES: Pupils equal, round, reactive to light and accommodation. No scleral icterus. Extraocular muscles intact.  HEENT: Head atraumatic, normocephalic. Oropharynx and  nasopharynx clear.  NECK:  Supple, no jugular venous distention. No thyroid enlargement, no tenderness.  LUNGS: Diminished bibasilar breath sounds with bibasal rales.  CARDIOVASCULAR: Regular rate and rhythm, S1, S2 normal. No murmurs, rubs, or gallops.  ABDOMEN: Soft, nondistended, nontender. Bowel sounds present. No organomegaly or mass.  EXTREMITIES: 1+ bilateral lower extremity pitting edema with no cyanosis, or clubbing.  NEUROLOGIC: Cranial nerves II through XII are intact. Muscle strength 5/5 in all extremities. Sensation intact. Gait not checked.  PSYCHIATRIC: The patient is alert and oriented x 3.  Normal affect and good eye contact. SKIN: No obvious rash, lesion, or ulcer.   LABORATORY PANEL:   CBC Recent Labs  Lab 08/04/22 1705  WBC 8.1  HGB 13.1  HCT 43.1  PLT 179   ------------------------------------------------------------------------------------------------------------------  Chemistries  Recent Labs  Lab 08/04/22 1705  NA 143  K 3.9  CL 103  CO2 31  GLUCOSE 119*  BUN 23  CREATININE 0.98  CALCIUM 9.3  AST 23  ALT 25  ALKPHOS 88  BILITOT 1.6*   ------------------------------------------------------------------------------------------------------------------  Cardiac Enzymes No results for input(s): "TROPONINI" in the last 168 hours. ------------------------------------------------------------------------------------------------------------------  RADIOLOGY:  DG Chest 2 View  Result Date: 08/04/2022 CLINICAL DATA:  Shortness of breath EXAM: CHEST - 2 VIEW COMPARISON:  06/15/2022 FINDINGS: Small bilateral pleural effusions. Cardiomegaly with central congestion and mild diffuse interstitial pulmonary edema. Bibasilar partial consolidations. Aortic atherosclerosis. No pneumothorax IMPRESSION: Cardiomegaly with central congestion and mild interstitial pulmonary edema. Small bilateral pleural effusions with bibasilar atelectasis or pneumonia. Electronically  Signed   By: Donavan Foil M.D.   On: 08/04/2022 17:28      IMPRESSION AND PLAN:  Assessment and Plan: * Acute respiratory failure with hypoxia ([place]) - The patient will be admitted to a stepdown unit bed for close monitoring specially given history of myasthenia gravis and Eaton-Lambert syndrome. - We will continue him on BiPAP. - O2 protocol will be followed. - This is likely secondary to his acute on chronic systolic CHF.  Acute on chronic systolic CHF (congestive heart failure) ([place]) - The patient will be reached with IV Lasix. - We will follow serial troponins. - Cardiology consult to be obtained. - I notified Dr. Saralyn Pilar about patient.  Myasthenia gravis ([place]) - We will continue on CellCept and pyridostigmine.  Dyslipidemia - We will continue statin therapy.  Coronary artery disease involving native coronary artery of  native heart without angina pectoris - We will continue aspirin and Plavix as well as statin therapy.  Benign prostatic hyperplasia - We will continue Flomax.    DVT prophylaxis: Lovenox.  Advanced Care Planning:  Code Status: full code.  Family Communication:  The plan of care was discussed in details with the patient (and family). I answered all questions. The patient agreed to proceed with the above mentioned plan. Further management will depend upon hospital course. Disposition Plan: Back to previous home environment Consults called: Cardiology. All the records are reviewed and case discussed with ED provider.  Status is: Inpatient   At the time of the admission, it appears that the appropriate admission status for this patient is inpatient.  This is judged to be reasonable and necessary in order to provide the required intensity of service to ensure the patient's safety given the presenting symptoms, physical exam findings and initial radiographic and laboratory data in the context of comorbid conditions.  The patient requires inpatient status due  to high intensity of service, high risk of further deterioration and high frequency of surveillance required.  I certify that at the time of admission, it is my clinical judgment that the patient will require inpatient hospital care extending more than 2 midnights.                            Dispo: The patient is from: Home              Anticipated d/c is to: Home              Patient currently is not medically stable to d/c.              Difficult to place patient: No  Christel Mormon M.D on 08/05/2022 at 12:04 AM  [facility]   From 7 PM-7 AM, contact night-coverage [email or web address]  CC: Primary care physician; [name], MD

## 2022-08-05 ENCOUNTER — Encounter: Payer: Medicare Other | Admitting: Occupational Therapy

## 2022-08-05 DIAGNOSIS — I509 Heart failure, unspecified: Secondary | ICD-10-CM

## 2022-08-05 DIAGNOSIS — J9601 Acute respiratory failure with hypoxia: Secondary | ICD-10-CM | POA: Insufficient documentation

## 2022-08-05 DIAGNOSIS — G7080 Lambert-Eaton syndrome, unspecified: Secondary | ICD-10-CM

## 2022-08-05 DIAGNOSIS — E785 Hyperlipidemia, unspecified: Secondary | ICD-10-CM

## 2022-08-05 DIAGNOSIS — N400 Enlarged prostate without lower urinary tract symptoms: Secondary | ICD-10-CM

## 2022-08-05 DIAGNOSIS — J9621 Acute and chronic respiratory failure with hypoxia: Secondary | ICD-10-CM | POA: Diagnosis not present

## 2022-08-05 LAB — CBC
HCT: 41.6 % (ref 39.0–52.0)
Hemoglobin: 12.5 g/dL — ABNORMAL LOW (ref 13.0–17.0)
MCH: 28.7 pg (ref 26.0–34.0)
MCHC: 30 g/dL (ref 30.0–36.0)
MCV: 95.4 fL (ref 80.0–100.0)
Platelets: 160 10*3/uL (ref 150–400)
RBC: 4.36 MIL/uL (ref 4.22–5.81)
RDW: 15.6 % — ABNORMAL HIGH (ref 11.5–15.5)
WBC: 7.8 10*3/uL (ref 4.0–10.5)
nRBC: 0 % (ref 0.0–0.2)

## 2022-08-05 LAB — URINALYSIS, ROUTINE W REFLEX MICROSCOPIC
Bilirubin Urine: NEGATIVE
Glucose, UA: NEGATIVE mg/dL
Ketones, ur: NEGATIVE mg/dL
Leukocytes,Ua: NEGATIVE
Nitrite: NEGATIVE
Protein, ur: NEGATIVE mg/dL
Specific Gravity, Urine: 1.014 (ref 1.005–1.030)
Squamous Epithelial / HPF: NONE SEEN (ref 0–5)
pH: 5 (ref 5.0–8.0)

## 2022-08-05 LAB — BASIC METABOLIC PANEL WITH GFR
Anion gap: 8 (ref 5–15)
BUN: 23 mg/dL (ref 8–23)
CO2: 31 mmol/L (ref 22–32)
Calcium: 8.6 mg/dL — ABNORMAL LOW (ref 8.9–10.3)
Chloride: 104 mmol/L (ref 98–111)
Creatinine, Ser: 0.95 mg/dL (ref 0.61–1.24)
GFR, Estimated: >60 mL/min
Glucose, Bld: 101 mg/dL — ABNORMAL HIGH (ref 70–99)
Potassium: 3.8 mmol/L (ref 3.5–5.1)
Sodium: 143 mmol/L (ref 135–145)

## 2022-08-05 MED ORDER — ENSURE ENLIVE PO LIQD: 237.0000 mL | Freq: Two times a day (BID) | ORAL | Status: DC

## 2022-08-05 MED ORDER — ADULT MULTIVITAMIN W/MINERALS CH
1.0000 | ORAL_TABLET | Freq: Every day | ORAL | Status: DC
  Administered 2022-08-05 – 2022-08-07 (×3): 1 via ORAL
  Filled 2022-08-05 (×3): qty 1

## 2022-08-05 MED ORDER — PYRIDOSTIGMINE BROMIDE 60 MG PO TABS
30.0000 mg | ORAL_TABLET | Freq: Every day | ORAL | Status: DC
  Administered 2022-08-05 – 2022-08-07 (×13): 30 mg via ORAL
  Filled 2022-08-05 (×16): qty 0.5

## 2022-08-05 MED ORDER — PYRIDOSTIGMINE BROMIDE 60 MG PO TABS
30.0000 mg | ORAL_TABLET | Freq: Two times a day (BID) | ORAL | Status: DC
  Administered 2022-08-05: 30 mg via ORAL
  Filled 2022-08-05: qty 0.5

## 2022-08-05 NOTE — Progress Notes (Signed)
FVC X 3 attempts Best  -28cmH2O

## 2022-08-05 NOTE — Discharge Instructions (Signed)

## 2022-08-05 NOTE — Assessment & Plan Note (Signed)
-   We will continue aspirin and Plavix as well as statin therapy.

## 2022-08-05 NOTE — ED Notes (Addendum)
Pt SPO2 90-92% while eating breakfast so turned up the  5L. Pt repositioned in bed again.

## 2022-08-05 NOTE — Assessment & Plan Note (Signed)
-   We will continue CellCept and pyridostigmine.

## 2022-08-05 NOTE — Assessment & Plan Note (Signed)
-   We will continue on CellCept and pyridostigmine.

## 2022-08-05 NOTE — ED Notes (Addendum)
Pt taken off Bipap put on [place] 2L for breakfast. Pt repositioned in bed.

## 2022-08-05 NOTE — Assessment & Plan Note (Signed)
-   We will continue statin therapy. 

## 2022-08-05 NOTE — ED Notes (Signed)
Patient is asleep with daughter at bedside.

## 2022-08-05 NOTE — Telephone Encounter (Signed)
LM with pt to cb

## 2022-08-05 NOTE — Assessment & Plan Note (Signed)
-   The patient will be reached with IV Lasix. - We will follow serial troponins. - Cardiology consult to be obtained. - I notified Dr. Saralyn Pilar about patient.

## 2022-08-05 NOTE — Consult Note (Signed)
[facility] NOTE       Patient ID: BURRELL HODAPP MRN: [id number] DOB/AGE: 05/27/1926 88 y.o.  Admit date: 08/04/2022 Referring Physician Dr. Eugenie Norrie Primary Physician Dr. [name]  Primary Cardiologist Dr. Nehemiah Massed Reason for Consultation AoCHF  HPI: Keven Osborn is a 88 year old male with a history of HFrEF (EF 25-30% with multiple WMAs 05/2022), mild AS, mod-severe MR, CAD (CTO LAD, moderate to severe disease in OM, recent NSTEMI 05/2022, medically managed), moderate pulmonary, hypertension, myasthenia gravis who presented to [facility] ED 08/05/2022 in respiratory distress. Cardiology is consulted for further assistance with his heart failure.   He presents with his daughter who contributes to the history.  His daughter noted that his weight had been creeping up slightly from last Thursday through Sunday about 4 pounds, it is unclear whether he took an extra dose of his 40 mg of p.o. Lasix during this or not really does with slight weight gain.  On Sunday he had a [employer] country ham biscuit and a couple pieces of cheese toast and later on in the day he went to [facility] to watch one of his relatives play ball and had to walk quite a long distance to get there.  He felt short of breath during this walk and had to make frequent stops in order to catch his breath.  Yesterday he continued to feel short of breath despite taking his morning Lasix, with significant dyspnea on exertion, and noted his oxygen was low by his home pulse ox.  His daughter put him back on supplemental oxygen that he had leftover from his hospitalization in September with improvement in his O2 sat, but he continued to feel short of breath so they came to the ED.  He initially required BiPAP, but at my time of evaluation he has been weaned to 4 L by nasal cannula and is sitting upright eating his lunch, he says he feels about "50% better" compared to when he came in.  He denied any chest discomfort  while he was short of breath, denies dizziness, palpitations, or any worsening of his chronic lymphedema bilaterally.  Admits to compliance with his aspirin and Plavix.  He was given IV Lasix 40 mg x 2 doses with clinical improvement and about 700 mL of urine documented output so far.  He was hypertensive with blood pressure 160/100 and O2 sat 95% on 4 L, heart rate in sinus rhythm in the 70s to 80s on telemetry.  His labs are notable for a BNP of 1500, high-sensitivity troponin minimally elevated with a flat trend at 55-64.  Potassium 3.8, BUN/creatinine 23/0.95 and GFR greater than 60.  H&H stable at 12.5/41 chest x-ray with cardiomegaly central vascular congestion and interstitial edema with small bilateral pleural effusions with atelectasis.  Shows sinus rhythm with a nonspecific IVCD rate of 70 and occasional PVCs  Review of systems complete and found to be negative unless listed above     Past Medical History:  Diagnosis Date   Abnormal chest CT 03/28/2017   Allergy    Anemia    Angina pectoris ([place]) 04/02/2011   Aortic aneurysm ([place]) 03/28/2017   Saw Dr Genevive Bi 04/16/17 - recommended f/u CT in 3 months.     Atherosclerosis of both carotid arteries 07/17/2014   Basal cell carcinoma 06/25/2021   Left ant neck. Nodulocystic pattern, close to margin   Benign lipomatous neoplasm of skin and subcutaneous tissue of left arm 10/04/2013   Bilateral carotid artery stenosis 08/30/2014  Overview:  Less than 50% 2015   Bladder cancer ([place]) 03/05/2013   CAD (coronary artery disease) 06/18/2014   Cancer ([place]) 11/24/2012   bladder. BCG treatments by Dr Jacqlyn Larsen.   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 WD SCC. Re-shaved 01/14/2016. Excised 02/05/2016, margins free.   Squamous cell carcinoma of skin 11/03/2018   Right cheek ant. to sideburn. Poorly differentiated.   Squamous cell carcinoma of skin 02/02/2019   Right mid dorsum forearm. WD SCC. EDC.    Past Surgical History:  Procedure Laterality Date   [place]    (Not in a hospital admission)  Social History   Socioeconomic History   Marital status: Widowed    Spouse name: Not on file   Number of children: Not on file   Years of education: Not on file   Highest education level: Not on file  Occupational History   Not on file  Tobacco Use   Smoking status: Former    Types: Cigarettes    Quit date: 09/29/1958    Years since quitting: 63.8   Smokeless tobacco: Never  Vaping Use   Vaping Use: Never used  Substance and Sexual Activity   Alcohol use: Yes    Alcohol/week: 0.0 standard drinks of alcohol    Comment: occasionally   Drug use: No   Sexual activity: Never  Other Topics Concern   Not on file  Social History Narrative   Pt is married with 2 children - 1 son, 1 daughter. Previously self-employed in [place] Strain: [place]  (11/19/2021)   Overall Financial Resource Strain (CARDIA)    Difficulty of Paying Living Expenses: Not hard at all  Food Insecurity: No Food Insecurity (08/05/2022)   Hunger Vital Sign     Worried About Running Out of Food in the Last Year: Never true    Ran Out of Food in the Last Year: Never true  Transportation Needs: No Transportation Needs (08/05/2022)   PRAPARE - Hydrologist (Medical): No    Lack of Transportation (Non-Medical): No  Physical Activity: Not on file  Stress: No Stress Concern Present (11/19/2021)   [place]    Feeling of Stress : Not at all  Social Connections: Unknown (11/19/2021)   Social Connection and Isolation Panel [NHANES]    Frequency of Communication with Friends and Family: More than three times a week    Frequency of Social Gatherings with Friends and Family: More than three times a week    Attends  Religious Services: More than 4 times per year    Active Member of Clubs or Organizations: Not on file    Attends Club or Organization Meetings: Not on file    Marital Status: Widowed  Intimate Partner Violence: Not At Risk (08/05/2022)   Humiliation, Afraid, Rape, and Kick questionnaire    Fear of Current or Ex-Partner: No    Emotionally Abused: No    Physically Abused: No    Sexually Abused: No    Family History  Problem Relation Age of Onset   Lung cancer Sister    Prostate cancer Brother    Lung cancer Brother    Arthritis Other        parent   Colon cancer Neg Hx      Vitals:   08/05/22 0800 08/05/22 0815 08/05/22 0830 08/05/22 0845  BP: 115/70     Pulse: 77 79 83 93  Resp: (!) 21 (!) '23 20 20  '$ Temp:      TempSrc:      SpO2: 98% 92% 92% 95%  Weight:      Height:        PHYSICAL EXAM General: Pleasant elderly Caucasian male, well nourished, in no acute distress.  Sitting upright in recliner eating lunch, daughter at bedside HEENT:  Normocephalic and atraumatic. Neck:  No JVD.  Lungs: Normal respiratory effort on 4 L by nasal cannula.  Bibasilar crackles without appreciable wheezes.   Heart: HRRR . Normal S1 and S2 without gallops  or murmurs.  Abdomen: Non-distended appearing.  Msk: Normal strength and tone for age. Extremities: Warm and well perfused. No clubbing, cyanosis.  Generally edematous bilateral lower extremities with compression stockings and 2 additional layers of socks in place.  Neuro: Alert and oriented X 3. Psych:  Answers questions appropriately.   Labs: Basic Metabolic Panel: Recent Labs    08/04/22 1705 08/05/22 0435  NA 143 143  K 3.9 3.8  CL 103 104  CO2 31 31  GLUCOSE 119* 101*  BUN 23 23  CREATININE 0.98 0.95  CALCIUM 9.3 8.6*   Liver Function Tests: Recent Labs    08/04/22 1705  AST 23  ALT 25  ALKPHOS 88  BILITOT 1.6*  PROT 7.3  ALBUMIN 4.3   No results for input(s): "LIPASE", "AMYLASE" in the last 72 hours. CBC: Recent Labs    08/04/22 1705 08/05/22 0435  WBC 8.1 7.8  NEUTROABS 6.9  --   HGB 13.1 12.5*  HCT 43.1 41.6  MCV 94.7 95.4  PLT 179 160   Cardiac Enzymes: Recent Labs    08/04/22 1705 08/04/22 1905  TROPONINIHS 58* 64*   BNP: Recent Labs    08/04/22 1712  BNP 1,547.5*   D-Dimer: No results for input(s): "DDIMER" in the last 72 hours. Hemoglobin A1C: No results for input(s): "HGBA1C" in the last 72 hours. Fasting Lipid Panel: No results for input(s): "CHOL", "HDL", "LDLCALC", "TRIG", "CHOLHDL", "LDLDIRECT" in the last 72 hours. Thyroid Function Tests: No results for input(s): "TSH", "T4TOTAL", "T3FREE", "THYROIDAB" in the last 72 hours.  Invalid input(s): "FREET3" Anemia Panel: No results for input(s): "VITAMINB12", "FOLATE", "FERRITIN", "TIBC", "IRON", "RETICCTPCT" in the last 72 hours.   Radiology: DG Chest 2 View  Result Date: 08/04/2022 CLINICAL DATA:  Shortness of breath EXAM: CHEST - 2 VIEW COMPARISON:  06/15/2022 FINDINGS: Small bilateral pleural effusions. Cardiomegaly with central congestion and mild diffuse interstitial pulmonary edema. Bibasilar partial consolidations. Aortic atherosclerosis. No pneumothorax IMPRESSION:  Cardiomegaly with central congestion and mild interstitial  pulmonary edema. Small bilateral pleural effusions with bibasilar atelectasis or pneumonia. Electronically Signed   By: Donavan Foil M.D.   On: 08/04/2022 17:28    ECHO 05/2022  1. Left ventricular ejection fraction, by estimation, is 25 to 30%. The  left ventricle has severely decreased function. The left ventricle  demonstrates regional wall motion abnormalities (see scoring  diagram/findings for description). The left  ventricular internal cavity size was moderately dilated. Left ventricular  diastolic parameters were normal.   2. Right ventricular systolic function is normal. The right ventricular  size is normal.   3. Left atrial size was moderately dilated.   4. Right atrial size was moderately dilated.   5. The mitral valve is normal in structure. Moderate to severe mitral  valve regurgitation.   6. Tricuspid valve regurgitation is moderate.   7. The aortic valve is normal in structure. Aortic valve regurgitation is  trivial.   FINDINGS   Left Ventricle: Left ventricular ejection fraction, by estimation, is 25  to 30%. The left ventricle has severely decreased function. The left  ventricle demonstrates regional wall motion abnormalities. Severe akinesis  of the left ventricular, mid-apical  anteroseptal wall, apical segment and anterior segment. The left  ventricular internal cavity size was moderately dilated. There is no left  ventricular hypertrophy. Left ventricular diastolic parameters were  normal.   Right Ventricle: The right ventricular size is normal. No increase in  right ventricular wall thickness. Right ventricular systolic function is  normal.   Left Atrium: Left atrial size was moderately dilated.   Right Atrium: Right atrial size was moderately dilated.   Pericardium: There is no evidence of pericardial effusion.   Mitral Valve: The mitral valve is normal in structure. Moderate to severe  mitral  valve regurgitation.   Tricuspid Valve: The tricuspid valve is normal in structure. Tricuspid  valve regurgitation is moderate.   Aortic Valve: The aortic valve is normal in structure. Aortic valve  regurgitation is trivial. Aortic valve mean gradient measures 9.5 mmHg.  Aortic valve peak gradient measures 16.7 mmHg. Aortic valve area, by VTI  measures 1.28 cm.   Pulmonic Valve: The pulmonic valve was normal in structure. Pulmonic valve  regurgitation is trivial.   Aorta: The aortic root and ascending aorta are structurally normal, with  no evidence of dilitation.   IAS/Shunts: No atrial level shunt detected by color flow Doppler.   TELEMETRY reviewed by me (LT) 08/05/2022 : Sinus rhythm 60s to 70s  EKG reviewed by me: Shows sinus rhythm with a nonspecific IVCD rate of 70 and occasional PVCs  Data reviewed by me (LT) 08/05/2022: Last outpatient cardiology note, ED note, admission H&P, CBC, BMP troponin vitals telemetry BNP I's and O's  Principal Problem:   Acute on chronic hypoxic respiratory failure ([place]) Active Problems:   Coronary artery disease involving native coronary artery of native heart without angina pectoris   Lambert-Eaton myasthenic syndrome (HCC)   Benign prostatic hyperplasia   Acute on chronic systolic CHF (congestive heart failure) ([place])   Dyslipidemia    ASSESSMENT AND PLAN:  Bradden Tadros is a 88 year old male with a history of HFrEF (EF 25-30% with multiple WMAs 05/2022), mild AS, mod-severe MR, CAD (CTO LAD, moderate to severe disease in OM, recent NSTEMI 05/2022, medically managed), moderate pulmonary, hypertension, myasthenia gravis who presented to [facility] ED 08/05/2022 in respiratory distress. Cardiology is consulted for further assistance with his heart failure.   #Acute on chronic HFrEF (EF 25-30% with multiple [facility] 05/2022) Presents with  acute respiratory distress in the setting of slight worsening dyspnea on exertion for 3 days's and a 4 pound weight gain  prior to presentation, suspect dietary noncompliance ([employer] country ham biscuit that he ate on Sunday) a significant cause of his flash pulmonary edema mission, requiring BiPAP in the emergency department.  BNP is elevated to 1500, and has bibasilar crackles to auscultation on exam with an oxygen requirement of 5 L with not at baseline. -S/p IV Lasix 40 mg IV x2, continue this for now -Beta-blocker is contraindicated with his history of myasthenia gravis -At baseline he is rather hypotensive, intolerant of GDMT with ACE inhibitor, Entresto, MRA.  Discussed possibility of adding GLP-1/SGLT2 at some point pending clinical course, daughter seems to have lots of concerns via the cost -Strict I's/O, daily weights, salt and fluid restriction  #CAD without chest pain, recent NSTEMI medically managed 05/2022 #Elevated troponin Minimally elevated with a flat trend in the absence of chest pain this is most consistent with demand/supply mismatch and not ACS  -Continue DAPT with aspirin and Plavix until at least March 2024,ideally longer  #Myasthenia gravis On CellCept and pyridostigmine no beta-blocker or CCB  This patient's plan of care was discussed and created with Dr. Saralyn Pilar and he is in agreement.  Signed: Tristan Schroeder , PA-C 08/05/2022, 11:47 AM [facility]

## 2022-08-05 NOTE — ED Notes (Signed)
Pt moved from the bed to the chair per pt request.

## 2022-08-05 NOTE — Progress Notes (Signed)
Initial Nutrition Assessment  DOCUMENTATION CODES:   Not applicable  INTERVENTION:   -MVI with minerals daily -Ensure Enlive po BID, each supplement provides 350 kcal and 20 grams of protein -Liberalize diet to 2 gram sodium for wider variety of meal selections -Provided "Low Sodium Nutrition Therapy" handout from [facility]'s Nutrition Care Manual; attached to AVS/ discharge summary  NUTRITION DIAGNOSIS:   Increased nutrient needs related to chronic illness (CHF) as evidenced by estimated needs.  GOAL:   Patient will meet greater than or equal to 90% of their needs  MONITOR:   PO intake, Supplement acceptance  REASON FOR ASSESSMENT:   Rounds    ASSESSMENT:   Pt with medical history significant for asthma, coronary artery disease, hypertension, dyslipidemia, Lambert-Eaton myasthenic syndrome presented  with acute onset of worsening dyspnea for the last couple of days with associated weight gain of 4 pounds over the last 3 days without significant cough or wheezing  Pt admitted with acute respiratory failure and CHF.   Reviewed I/O's: -700 ml x 24 hours  UOP: 700 ml x 24 hours  Pt unavailable at time of visit. RD unable to obtain further nutrition-related history or complete nutrition-focused physical exam at this time.     Pt currently on a heart healthy duet. No meal completion data available to assess at this time.   Reviewed wt hx; pt has experienced a 5% wt loss over the past year, which is not significant for time frame. Given pt's edema, suspect true weight loss as well as fat and muscle depletions may be masked.   Pt with increased nutritional needs and would benefit from addition of oral nutrition supplements.   Medications reviewed and include lasix.   Labs reviewed: CBGS: 152.     Diet Order:   Diet Order             Diet Heart Room service appropriate? Yes; Fluid consistency: Thin  Diet effective now                   EDUCATION NEEDS:   No  education needs have been identified at this time  Skin:  Skin Assessment: Reviewed RN Assessment  Last BM:  08/04/22  Height:   Ht Readings from Last 1 Encounters:  08/04/22 '5\' 10"'$  (1.778 m)    Weight:   Wt Readings from Last 1 Encounters:  08/04/22 86.2 kg    Ideal Body Weight:  75.5 kg  BMI:  Body mass index is 27.26 kg/m.  Estimated Nutritional Needs:   Kcal:  1850-2050  Protein:  100-115 grams  Fluid:  > 1.8 L    Loistine Chance, RD, LDN, [place] Registered Dietitian II Certified Diabetes Care and Education Specialist Please refer to [facility] for RD and/or RD on-call/weekend/after hours pager

## 2022-08-05 NOTE — ED Notes (Signed)
Patient was incontinent of stool. Patient was cleaned and repositioned in bed. Linens were removes and clean linens were replaced.

## 2022-08-05 NOTE — ED Notes (Addendum)
Pt finished breakfast

## 2022-08-05 NOTE — Progress Notes (Signed)
[facility]                                                                              Harjit Leider, is a 88 y.o. male, DOB - October 09, 1925, TOI:[id number] Admit date - 08/04/2022    Outpatient Primary MD for the patient is [name], MD  LOS - 1  days  Chief Complaint  Patient presents with   Shortness of Breath       Brief summary   Patient is a 88 year old male with asthma, CAD, HTN, HLP, Lambert-Eaton myasthenic syndrome presented to ED with acute onset of worsening dyspnea for the last couple days with associated weight gain of 4 pounds over the last 3 days without significant cough or wheezing.  He did not have any worsening lower extremity edema.  He was been having dyspnea on exertion.  He was at an outside sports event a day before the admission and did not take his Lasix to avoid urinary frequency.  No chest pain or palpitations. No dysuria, oliguria or hematuria or flank pain.   In ED, respiratory rate 22, pulse 70, O2 sats 99% on 3 L, however was subsequently in worsening respiratory distress and was placed on BiPAP.  Chest x-ray showed cardiomegaly with mild interstitial pulmonary edema with bilateral pleural effusions and bibasilar atelectasis or pneumonia.  Given Lasix 40 mg IV in ED, was admitted for further work-up  Assessment & Plan    Principal Problem:   Acute on chronic hypoxic respiratory failure (HCC) -Underlying history of Lambert-Eaton metastatic syndrome, chronic systolic CHF with a EF of 25 to 30%, exacerbated due to not taking diuretics -Placed on BiPAP, continue and wean as tolerated -Per daughter at the bedside, he also appears to have OSA at night, not on any CPAP.  Will check nocturnal pulse ox and ABG to qualify for CPAP closer to discharge. -Per daughter, he was sent home on O2 2- 3 L during previous admission, however his sats improved and he has not been using the O2.  Will repeat home O2 eval prior to discharge.  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[place]) -Continue CellCept, pyridostigmine -PT OT  eval when able to tolerate    Dyslipidemia -Continue statin    Coronary artery disease involving native coronary artery of native heart without angina pectoris -Continue aspirin, Plavix, statin     Benign prostatic hyperplasia -Continue Flomax and finasteride   Code Status: Full code DVT Prophylaxis:  enoxaparin (LOVENOX) injection 40 mg Start: 08/04/22 2200   Level of Care: Level of care: Progressive Family Communication: Updated patient's daughter at the bedside   Disposition Plan:      Remains inpatient appropriate: Currently on BiPAP   Procedures:  None  Consultants:   Cardiology Antimicrobials: None   Medications  aspirin EC  81 mg Oral Daily   clopidogrel  75 mg Oral Daily   enoxaparin (LOVENOX) injection  40 mg Subcutaneous Q24H   feeding supplement  237 mL Oral BID BM   finasteride  5 mg Oral Daily   furosemide  40 mg Intravenous BID   multivitamin with minerals  1 tablet Oral Daily   mycophenolate  1,000 mg Oral BID   polyvinyl alcohol  1 drop Both Eyes Daily   pravastatin  20 mg Oral Daily   pyridostigmine  30 mg Oral BID   tacrolimus   Topical BID   tamsulosin  0.4 mg Oral Daily      Subjective:   Prestyn Stanco was seen and examined today.  Still on BiPAP, no acute chest pain, nausea or vomiting, daughter at the bedside.  No fevers  Objective:   Vitals:   08/05/22 0800 08/05/22 0815 08/05/22 0830 08/05/22 0845  BP: 115/70     Pulse: 77  79 83 93  Resp: (!) 21 (!) '23 20 20  '$ Temp:      TempSrc:      SpO2: 98% 92% 92% 95%  Weight:      Height:        Intake/Output Summary (Last 24 hours) at 08/05/2022 0951 Last data filed at 08/05/2022 0153 Gross per 24 hour  Intake --  Output 700 ml  Net -700 ml     Wt Readings from Last 3 Encounters:  08/04/22 86.2 kg  07/01/22 84.3 kg  06/18/22 81.5 kg     Exam General: Alert and awake, on BiPAP Cardiovascular: S1 S2 auscultated,  RRR Respiratory: Diminished bibasilar breath sounds with rales Gastrointestinal: Soft, nontender, nondistended, + bowel sounds Ext: 1+  pedal edema bilaterally Neuro: Strength 5/5 upper and lower extremities bilaterally Psych: currently on BiPAP    Data Reviewed:  I have personally reviewed following labs    CBC Lab Results  Component Value Date   WBC 7.8 08/05/2022   RBC 4.36 08/05/2022   HGB 12.5 (L) 08/05/2022   HCT 41.6 08/05/2022   MCV 95.4 08/05/2022   MCH 28.7 08/05/2022   PLT 160 08/05/2022   MCHC 30.0 08/05/2022   RDW 15.6 (H) 08/05/2022   LYMPHSABS 0.5 (L) 08/04/2022   MONOABS 0.7 08/04/2022   EOSABS 0.0 08/04/2022   BASOSABS 0.0 27/51/7001     Last metabolic panel Lab Results  Component Value Date   NA 143 08/05/2022   K 3.8 08/05/2022   CL 104 08/05/2022   CO2 31 08/05/2022   BUN 23 08/05/2022   CREATININE 0.95 08/05/2022   GLUCOSE 101 (H) 08/05/2022   GFRNONAA >60 08/05/2022   GFRAA >60 01/15/2020   CALCIUM 8.6 (L) 08/05/2022   PHOS 3.4 06/18/2022   PROT 7.3 08/04/2022   ALBUMIN 4.3 08/04/2022   BILITOT 1.6 (H) 08/04/2022   ALKPHOS 88 08/04/2022   AST 23 08/04/2022   ALT 25 08/04/2022   ANIONGAP 8 08/05/2022    CBG (last 3)  No results for input(s): "GLUCAP" in the last 72 hours.    Coagulation Profile: No results for input(s): "INR", "PROTIME" in the last 168 hours.   Radiology Studies: I have personally reviewed the imaging studies  DG Chest 2 View  Result Date: 08/04/2022 CLINICAL  DATA:  Shortness of breath EXAM: CHEST - 2 VIEW COMPARISON:  06/15/2022 FINDINGS: Small bilateral pleural effusions. Cardiomegaly with central congestion and mild diffuse interstitial pulmonary  edema. Bibasilar partial consolidations. Aortic atherosclerosis. No pneumothorax IMPRESSION: Cardiomegaly with central congestion and mild interstitial pulmonary edema. Small bilateral pleural effusions with bibasilar atelectasis or pneumonia. Electronically Signed   By: Donavan Foil M.D.   On: 08/04/2022 17:28       Jayceon Troy M.D. [facility] 08/05/2022, 9:51 AM  Available via Epic secure chat 7am-7pm After 7 pm, please refer to night coverage provider listed on amion.

## 2022-08-05 NOTE — Assessment & Plan Note (Signed)
-   We will continue Flomax 

## 2022-08-05 NOTE — ED Notes (Signed)
PT tolerated standing with walker while being cleaned from large BM, remains on 5L [place] with family at bedside.

## 2022-08-05 NOTE — ED Notes (Signed)
Patient is laying in bed with daughter at bedside.

## 2022-08-06 ENCOUNTER — Encounter: Payer: Medicare Other | Admitting: Occupational Therapy

## 2022-08-06 DIAGNOSIS — J9621 Acute and chronic respiratory failure with hypoxia: Secondary | ICD-10-CM | POA: Diagnosis not present

## 2022-08-06 DIAGNOSIS — I5023 Acute on chronic systolic (congestive) heart failure: Secondary | ICD-10-CM | POA: Diagnosis not present

## 2022-08-06 LAB — CBC
HCT: 38.8 % — ABNORMAL LOW (ref 39.0–52.0)
Hemoglobin: 11.6 g/dL — ABNORMAL LOW (ref 13.0–17.0)
MCH: 28.5 pg (ref 26.0–34.0)
MCHC: 29.9 g/dL — ABNORMAL LOW (ref 30.0–36.0)
MCV: 95.3 fL (ref 80.0–100.0)
Platelets: 135 10*3/uL — ABNORMAL LOW (ref 150–400)
RBC: 4.07 MIL/uL — ABNORMAL LOW (ref 4.22–5.81)
RDW: 15.5 % (ref 11.5–15.5)
WBC: 6.7 10*3/uL (ref 4.0–10.5)
nRBC: 0 % (ref 0.0–0.2)

## 2022-08-06 LAB — BASIC METABOLIC PANEL
Anion gap: 8 (ref 5–15)
BUN: 26 mg/dL — ABNORMAL HIGH (ref 8–23)
CO2: 35 mmol/L — ABNORMAL HIGH (ref 22–32)
Calcium: 8.8 mg/dL — ABNORMAL LOW (ref 8.9–10.3)
Chloride: 100 mmol/L (ref 98–111)
Creatinine, Ser: 1.26 mg/dL — ABNORMAL HIGH (ref 0.61–1.24)
GFR, Estimated: 53 mL/min — ABNORMAL LOW (ref >60)
Glucose, Bld: 118 mg/dL — ABNORMAL HIGH (ref 70–99)
Potassium: 3.3 mmol/L — ABNORMAL LOW (ref 3.5–5.1)
Sodium: 143 mmol/L (ref 135–145)

## 2022-08-06 MED ORDER — POTASSIUM CHLORIDE CRYS ER 20 MEQ PO TBCR
40.0000 meq | EXTENDED_RELEASE_TABLET | Freq: Two times a day (BID) | ORAL | Status: AC
  Administered 2022-08-06 (×2): 40 meq via ORAL
  Filled 2022-08-06 (×2): qty 2

## 2022-08-06 MED ORDER — FUROSEMIDE 10 MG/ML IJ SOLN
40.0000 mg | Freq: Two times a day (BID) | INTRAMUSCULAR | Status: DC
  Administered 2022-08-06 – 2022-08-07 (×2): 40 mg via INTRAVENOUS
  Filled 2022-08-06 (×2): qty 4

## 2022-08-06 NOTE — Hospital Course (Signed)
[name] is a 88 year old male with asthma, CAD, HTN, HLP, Lambert-Eaton myasthenic syndrome who presented to ED with acute onset of worsening dyspnea. He had associated weight gain as well without significant cough or wheezing.  Dyspnea on exertion was noted to be worse.  Due to work of breathing in the ER, he was placed on BiPAP. He has not been on continuous oxygen at home however does have oxygen on hand from prior needs from previous hospitalizations.  His daughter states that he does appear to warrant oxygen sometimes at night. CXR on admission showed interstitial pulmonary edema with bilateral pleural effusions. Cardiology was consulted and he was also started on IV Lasix.

## 2022-08-06 NOTE — Progress Notes (Signed)
SATURATION QUALIFICATIONS: (This note is used to comply with regulatory documentation for home oxygen)  Patient Saturations on Room Air at Rest = 91%  Patient Saturations on Room Air while Ambulating = 82%  Patient Saturations on 1 Liters of oxygen while Ambulating = 94%  Please briefly explain why patient needs home oxygen: to maintain oxygen saturations above 88%

## 2022-08-06 NOTE — Progress Notes (Signed)
NIF -28

## 2022-08-06 NOTE — Consult Note (Signed)
   Heart Failure Nurse Navigator Note  HFrEF 25 to 30%.  Wall motion abnormalities.  There are akinesis of the left ventricle, mid apical anterior septal wall, apical segment and anterior segment.  Left ventricle internal cavity was moderately dilated.  There was no LVH.  Left ventricular diastolic parameters were normal.  Moderate biatrial enlargement.  Moderate to severe mitral regurgitation moderate tricuspid regurgitation.  He presented to the emergency room with complaints of worsening shortness of breath, had also noted a 4 pound weight gain over the last 3 days.  Also noted dyspnea on exertion.  He admitted to dietary indiscretion on Sunday as having 2 pieces of cheese toast for breakfast and also [employer] country ham biscuit.  Patient had been to a sporting event and held his Lasix but did take it when he got home later that that afternoon BNP was 1547.  Chest x-ray revealed cardiomegaly.  Central venous congestion.  Mild interstitial pulmonary edema.  Small bilateral pulmonary effusions.  Comorbidities:  Asthma Coronary artery disease Hypertension Hyperlipidemia Lambert-Eaton myasthenic syndrome Chronic lymphedema Medications:  Aspirin 81 mg daily Plavix 75 mg daily Furosemide 40 mg IV 2 times a day Potassium chloride 40 mEq 2 times daily Pravastatin 20 mg daily  Labs:  Sodium 143, potassium 3.3, chloride 100, CO2 35, BUN 26, creatinine 1.26, estimated GFR 53. Weight is 85.6 kg Blood pressure 112/57 Intake not documented Output 2050 mL   Meeting with the patient and his daughter [name] who was at the bedside.  Discussed heart failure, he notes that his heart function is decreased.  He states that he weighs himself daily.  Went over reporting 2 pound weight gain overnight or total of 5 pounds within the week also to notify care providers of changes in symptoms such as increasing shortness of breath, increasing edema, PND and orthopnea.  Patient states that he does not use  salt, but he does use of salt substitute made with potassium chloride.  Cautioned use of that with kidney disease.  Voiced understanding.  Daughter states that the doctors have put him on a 1500 mg sodium restriction, explained to the patient that just the country ham biscuit that he had from [place] contained 1570 mg of sodium.  Daughter adds that he also had 2 pieces of cheese toast, that morning.  He states that he has problems with lymphedema and puts on his support stockings in the morning and takes them off at bedtime.  Went over fluid restriction, from what he listed he does not take in any more than 2000 mL of liquids daily.  Made aware that he has follow-up with Darylene Price in the outpatient heart failure clinic on November 21 at 3:30 in the afternoon.  States that she will check with her brother as patient does not like to drive that late in the afternoon but they will notify the heart failure clinic if they need to change that appointment.  They had no further questions.  We will continue to follow.  Pricilla Riffle RN [facility]

## 2022-08-06 NOTE — TOC Initial Note (Addendum)
Transition of Care [facility]) - Initial/Assessment Note    Patient Details  Name: [name] MRN: [id number] Date of Birth: Mar 28, 1926  [facility] CM/SW Contact:    Tiburcio Bash, LCSW Phone Number: 08/06/2022, 10:53 AM  Clinical Narrative:                  CSW met with patient and daughter at bedside. Patient reports living at home, at baseline was provided with home O2 but hardly wears it. Reports thinking it is through [employer]. Patient and daughter report they are interested in exploring patient's ability to get O2 free of charge through the [place] if possible, requested CSW follow up. They report that patient uses [facility] and see PCP Dr. Suzi Roots. Patient daughter reports her brother will most likely be the family to transport patient home. Reports that at time of discharge they will need O2 delivered to hospital room prior to dc.   CSW to call [facility] at [phone] to follow up with O2 questions, at this time family reports having O2 at home through [place] but would like [place] process started if will decrease costs.   LVM with home O2 with [facility] at [phone] Ext: [phone] Call back received, faxing clinicals and orders to [phone]  Expected Discharge Plan: [place] Barriers to Discharge: Continued Medical Work up   Patient Goals and CMS Choice Patient states their goals for this hospitalization and ongoing recovery are:: to go home CMS Medicare.gov Compare Post Acute Care list provided to:: Patient Choice offered to / list presented to : Patient  Expected Discharge Plan and Services Expected Discharge Plan: [place]       Living arrangements for the past 2 months: Single Family Home                                      Prior Living Arrangements/Services Living arrangements for the past 2 months: Single Family Home Lives with:: Self                   Activities of Daily Living Home Assistive  Devices/Equipment: Other (Comment) (pulse ox) ADL Screening (condition at time of admission) Patient's cognitive ability adequate to safely complete daily activities?: Yes Is the patient deaf or have difficulty hearing?: No Does the patient have difficulty seeing, even when wearing glasses/contacts?: No Does the patient have difficulty concentrating, remembering, or making decisions?: No Patient able to express need for assistance with ADLs?: Yes Does the patient have difficulty dressing or bathing?: No Independently performs ADLs?: Yes (appropriate for developmental age) Does the patient have difficulty walking or climbing stairs?: No Weakness of Legs: None Weakness of Arms/Hands: None  Permission Sought/Granted                  Emotional Assessment              Admission diagnosis:  Acute respiratory failure with hypoxia ([place]) [J96.01] Acute on chronic congestive heart failure, unspecified heart failure type ([place]) [I50.9] Acute on chronic hypoxic respiratory failure (HCC) [J96.21] Patient Active Problem List   Diagnosis Date Noted   Dyslipidemia 08/05/2022   Acute respiratory failure with hypoxia ([place]) 08/05/2022   Acute on chronic systolic CHF (congestive heart failure) ([place]) 08/04/2022   NSTEMI (non-ST elevated myocardial infarction) ([place])    Acute on chronic respiratory failure with hypoxia  and hypercapnia ([place]) 06/14/2022   Stasis dermatitis 05/24/2022   Acute on chronic combined systolic and diastolic CHF (congestive heart failure) ([place]) 09/28/2021   Benign prostatic hyperplasia 09/28/2021   Rectal bleeding 02/24/2021   Rectal irritation 11/25/2020   Lymphedema 11/19/2020   Pressure injury of right buttock, stage 2 ([place]) 04/23/2020   Lower limb ulcer, calf, left, limited to breakdown of skin ([place]) 12/27/2019   Dysuria 06/30/2019   Hyperglycemia 06/30/2019   LVH (left ventricular hypertrophy) due to hypertensive disease, without heart failure 05/26/2019    Pseudophakia, left eye 11/26/2018   Venous insufficiency of both lower extremities 10/13/2018   Swelling of limb 10/12/2018   Bilateral lower extremity edema 10/10/2018   Absent pulse in lower extremity 10/10/2018   Corneal thinning of right eye 07/17/2018   Eye pain 05/31/2018   Mild aortic valve stenosis 12/03/2017   Epididymoorchitis 09/02/2017   Hydrocele, right 08/11/2017   Aortic aneurysm ([place]) 03/28/2017   Abnormal chest CT 03/28/2017   Acute on chronic hypoxic respiratory failure ([place]) 12/27/2016   Hypotension    Dizziness 12/22/2016   Leg weakness 03/11/2016   Skin lesion 09/11/2015   Moderate mitral insufficiency 05/18/2015   Moderate tricuspid insufficiency 05/18/2015   Essential hypertension 04/25/2015   Rash 03/10/2015   Health care maintenance 03/10/2015   Bilateral carotid artery stenosis 08/30/2014   Atherosclerosis of both carotid arteries 74/08/8785   Chronic systolic CHF (congestive heart failure), NYHA class 3 ([place]) 07/17/2014   Coronary artery disease involving native coronary artery of native heart without angina pectoris 06/18/2014   CHF (congestive heart failure) ([place]) 04/20/2014   Congestive heart failure ([place]) 04/20/2014   SOB (shortness of breath) 03/10/2014   Abnormal EKG 03/10/2014   Mixed hyperlipidemia 03/10/2014   Benign lipomatous neoplasm of skin and subcutaneous tissue of left arm 10/04/2013   Arm skin lesion, left 09/04/2013   History of shingles 09/04/2013   History of bladder cancer 03/05/2013   Chronic prostatitis 09/30/2012   Elevated prostate specific antigen (PSA) 09/30/2012   Family history of malignant neoplasm of prostate 09/30/2012   Gross hematuria 09/30/2012   Incomplete emptying of bladder 09/30/2012   Anemia 08/30/2012   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[place]) 08/30/2012   Hypercholesterolemia 08/30/2012   Herpes zoster 11/28/2011   Hypertension, benign 04/02/2011   Lambert-Eaton myasthenic syndrome ([place]) 03/31/2011    PCP:  [name], MD Pharmacy:   [facility], [address] [place] [address] Phone: [phone] Fax: [phone]     Social Determinants of Health (SDOH) Interventions    Readmission Risk Interventions     No data to display

## 2022-08-06 NOTE — Progress Notes (Addendum)
[place] NOTE       Patient ID: [name] MRN: [id number] DOB/AGE: 86-Sep-1927 86 y.o.  Admit date: 08/04/2022 Referring Physician Dr. Eugenie Norrie Primary Physician Dr. [name]  Primary Cardiologist Dr. Nehemiah Massed Reason for Consultation AoCHF  HPI: [name] is a 88 year old male with a history of HFrEF (EF 25-30% with multiple WMAs 05/2022), mild AS, mod-severe MR, CAD (CTO LAD, moderate to severe disease in OM, recent NSTEMI 05/2022, medically managed), moderate pulmonary, hypertension, myasthenia gravis who presented to [facility] ED 08/05/2022 in respiratory distress. Cardiology is consulted for further assistance with his heart failure.   Interval History:  -didn't sleep well last night, bed is uncomfortable. -diuresing well (net neg 2.7L, no intake recorded). Oxygen weaned to 3L.  -no chest pain, no worsening shortness of breath.  -Cr with slight bump this morning  Review of systems complete and found to be negative unless listed above     Past Medical History:  Diagnosis Date   Abnormal chest CT 03/28/2017   Allergy    Anemia    Angina pectoris ([place]) 04/02/2011   Aortic aneurysm ([place]) 03/28/2017   Saw Dr Genevive Bi 04/16/17 - recommended f/u CT in 3 months.     Atherosclerosis of both carotid arteries 07/17/2014   Basal cell carcinoma 06/25/2021   Left ant neck. Nodulocystic pattern, close to margin   Benign lipomatous neoplasm of skin and subcutaneous tissue of left arm 10/04/2013   Bilateral carotid artery stenosis 08/30/2014   Overview:  Less than 50% 2015   Bladder cancer ([place]) 03/05/2013   CAD (coronary artery disease) 06/18/2014   Cancer ([place]) 11/24/2012   bladder. BCG treatments by Dr Jacqlyn Larsen.   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 WD SCC. Re-shaved 01/14/2016. Excised 02/05/2016, margins free.   Squamous cell carcinoma of skin 11/03/2018   Right cheek ant. to sideburn. Poorly differentiated.   Squamous cell carcinoma of skin 02/02/2019   Right mid dorsum forearm. WD SCC. EDC.    Past Surgical History:  Procedure Laterality Date   [place]    Medications Prior to Admission  Medication Sig Dispense Refill Last Dose   Acetaminophen (ARTHRITIS PAIN PO) Take 650 mg by mouth.   08/04/2022   aspirin 81 MG tablet Take 81 mg by mouth daily.   08/04/2022   clopidogrel (PLAVIX) 75 MG tablet Take 1 tablet (75 mg total) by mouth daily. Total at least 6 mos therapy w/ clopidogrel + aspirin 30 tablet 0 08/04/2022   docusate sodium (COLACE) 100 MG capsule Take 1 capsule (100 mg total) by mouth 2 (two) times daily as needed  for mild constipation. 10 capsule 0 08/04/2022   finasteride (PROSCAR) 5 MG tablet Take 5 mg by mouth daily.   08/04/2022   furosemide (LASIX) 40 MG tablet Take 1 tablet (40 mg total) by mouth daily. (Patient taking differently: Take 40 mg by mouth daily.) 90 tablet 3 08/04/2022   hydroxypropyl methylcellulose / hypromellose (ISOPTO TEARS / GONIOVISC) 2.5 % ophthalmic solution Place 1 drop into both eyes daily.   08/04/2022   mycophenolate (CELLCEPT) 250 MG capsule Take 1,000 mg by mouth 2  (two) times daily.    08/04/2022   pravastatin (PRAVACHOL) 20 MG tablet Take 1 tablet (20 mg total) by mouth daily. 90 tablet 1 08/04/2022   pyridostigmine (MESTINON) 60 MG tablet Take 30 mg by mouth 6 (six) times daily.    08/04/2022   tacrolimus (PROTOPIC) 0.1 % ointment Apply topically 2 (two) times daily. 100 g 2 08/04/2022   tamsulosin (FLOMAX) 0.4 MG CAPS Take 0.4 mg by mouth daily.   08/04/2022   polyethylene glycol (MIRALAX / GLYCOLAX) 17 g packet Take 17 g by mouth daily as needed for moderate constipation. 14 each 0 prn at prn    Social History   Socioeconomic History   Marital status: Widowed    Spouse name: Not on file   Number of children: Not on file   Years of education: Not on file   Highest education level: Not on file  Occupational History   Not on file  Tobacco Use   Smoking status: Former    Types: Cigarettes    Quit date: 09/29/1958    Years since quitting: 63.8   Smokeless tobacco: Never  Vaping Use   Vaping Use: Never used  Substance and Sexual Activity   Alcohol use: Yes    Alcohol/week: 0.0 standard drinks of alcohol    Comment: occasionally   Drug use: No   Sexual activity: Never  Other Topics Concern   Not on file  Social History Narrative   Pt is married with 2 children - 1 son, 1 daughter. Previously self-employed in [place] Strain: [place]  (11/19/2021)   Overall Financial Resource Strain (CARDIA)    Difficulty of Paying Living Expenses: Not hard at all  Food Insecurity: No Food Insecurity (08/05/2022)   Hunger Vital Sign    Worried About Running Out of Food in the Last Year: Never true    Ran Out of Food in the Last Year: Never true  Transportation Needs: No Transportation Needs (08/05/2022)   PRAPARE - Hydrologist (Medical): No    Lack of Transportation (Non-Medical): No  Physical Activity: Not on file  Stress: No Stress Concern Present (11/19/2021)    [place]    Feeling of Stress : Not at all  Social Connections: Unknown (11/19/2021)   Social Connection and Isolation Panel [NHANES]    Frequency of Communication with Friends and Family: More than three times a week    Frequency of Social Gatherings with Friends and Family: More than three times a week    Attends Religious Services: More than 4 times per year    Active Member of [employer] or Organizations: Not on file    Attends Archivist Meetings: Not on file    Marital Status: Widowed  Intimate Partner Violence: Not At Risk (08/05/2022)   Humiliation, Afraid, Rape, and Kick questionnaire  Fear of Current or Ex-Partner: No    Emotionally Abused: No    Physically Abused: No    Sexually Abused: No    Family History  Problem Relation Age of Onset   Lung cancer Sister    Prostate cancer Brother    Lung cancer Brother    Arthritis Other        parent   Colon cancer Neg Hx      Vitals:   08/05/22 1932 08/05/22 2300 08/06/22 0336 08/06/22 0755  BP: (!) 114/57 101/68 103/63 112/61  Pulse: 69 68 68 65  Resp: (!) 21 (!) 22 16 (!) 22  Temp: 97.9 F (36.6 C) (!) 97.5 F (36.4 C) 97.9 F (36.6 C) 98.1 F (36.7 C)  TempSrc:  Oral Axillary Oral  SpO2: 97% 96% 92% 98%  Weight:   85.6 kg   Height:        PHYSICAL EXAM General: Pleasant elderly Caucasian male, well nourished, in no acute distress.  Sitting upright in recliner, daughter at bedside HEENT:  Normocephalic and atraumatic. Neck:  No JVD.  Lungs: Normal respiratory effort on 3 L by nasal cannula.  Bibasilar crackles without appreciable wheezes, aeration somewhat improved. Heart: HRRR . Normal S1 and S2. 2/6 systolic murmur best heard at the RUSB.  Abdomen: Non-distended appearing.  Msk: Normal strength and tone for age. Extremities: Warm and well perfused. No clubbing, cyanosis.  Generally edematous bilateral lower extremities.  Psych:  Answers  questions appropriately.   Labs: Basic Metabolic Panel: Recent Labs    08/05/22 0435 08/06/22 0459  NA 143 143  K 3.8 3.3*  CL 104 100  CO2 31 35*  GLUCOSE 101* 118*  BUN 23 26*  CREATININE 0.95 1.26*  CALCIUM 8.6* 8.8*    Liver Function Tests: Recent Labs    08/04/22 1705  AST 23  ALT 25  ALKPHOS 88  BILITOT 1.6*  PROT 7.3  ALBUMIN 4.3    No results for input(s): "LIPASE", "AMYLASE" in the last 72 hours. CBC: Recent Labs    08/04/22 1705 08/05/22 0435 08/06/22 0459  WBC 8.1 7.8 6.7  NEUTROABS 6.9  --   --   HGB 13.1 12.5* 11.6*  HCT 43.1 41.6 38.8*  MCV 94.7 95.4 95.3  PLT 179 160 135*    Cardiac Enzymes: Recent Labs    08/04/22 1705 08/04/22 1905  TROPONINIHS 58* 64*    BNP: Recent Labs    08/04/22 1712  BNP 1,547.5*    D-Dimer: No results for input(s): "DDIMER" in the last 72 hours. Hemoglobin A1C: No results for input(s): "HGBA1C" in the last 72 hours. Fasting Lipid Panel: No results for input(s): "CHOL", "HDL", "LDLCALC", "TRIG", "CHOLHDL", "LDLDIRECT" in the last 72 hours. Thyroid Function Tests: No results for input(s): "TSH", "T4TOTAL", "T3FREE", "THYROIDAB" in the last 72 hours.  Invalid input(s): "FREET3" Anemia Panel: No results for input(s): "VITAMINB12", "FOLATE", "FERRITIN", "TIBC", "IRON", "RETICCTPCT" in the last 72 hours.   Radiology: DG Chest 2 View  Result Date: 08/04/2022 CLINICAL DATA:  Shortness of breath EXAM: CHEST - 2 VIEW COMPARISON:  06/15/2022 FINDINGS: Small bilateral pleural effusions. Cardiomegaly with central congestion and mild diffuse interstitial pulmonary edema. Bibasilar partial consolidations. Aortic atherosclerosis. No pneumothorax IMPRESSION: Cardiomegaly with central congestion and mild interstitial pulmonary edema. Small bilateral pleural effusions with bibasilar atelectasis or pneumonia. Electronically Signed   By: Donavan Foil M.D.   On: 08/04/2022 17:28    ECHO 05/2022  1. Left ventricular  ejection fraction, by estimation, is  25 to 30%. The  left ventricle has severely decreased function. The left ventricle  demonstrates regional wall motion abnormalities (see scoring  diagram/findings for description). The left  ventricular internal cavity size was moderately dilated. Left ventricular  diastolic parameters were normal.   2. Right ventricular systolic function is normal. The right ventricular  size is normal.   3. Left atrial size was moderately dilated.   4. Right atrial size was moderately dilated.   5. The mitral valve is normal in structure. Moderate to severe mitral  valve regurgitation.   6. Tricuspid valve regurgitation is moderate.   7. The aortic valve is normal in structure. Aortic valve regurgitation is  trivial.   FINDINGS   Left Ventricle: Left ventricular ejection fraction, by estimation, is 25  to 30%. The left ventricle has severely decreased function. The left  ventricle demonstrates regional wall motion abnormalities. Severe akinesis  of the left ventricular, mid-apical  anteroseptal wall, apical segment and anterior segment. The left  ventricular internal cavity size was moderately dilated. There is no left  ventricular hypertrophy. Left ventricular diastolic parameters were  normal.   Right Ventricle: The right ventricular size is normal. No increase in  right ventricular wall thickness. Right ventricular systolic function is  normal.   Left Atrium: Left atrial size was moderately dilated.   Right Atrium: Right atrial size was moderately dilated.   Pericardium: There is no evidence of pericardial effusion.   Mitral Valve: The mitral valve is normal in structure. Moderate to severe  mitral valve regurgitation.   Tricuspid Valve: The tricuspid valve is normal in structure. Tricuspid  valve regurgitation is moderate.   Aortic Valve: The aortic valve is normal in structure. Aortic valve  regurgitation is trivial. Aortic valve mean gradient  measures 9.5 mmHg.  Aortic valve peak gradient measures 16.7 mmHg. Aortic valve area, by VTI  measures 1.28 cm.   Pulmonic Valve: The pulmonic valve was normal in structure. Pulmonic valve  regurgitation is trivial.   Aorta: The aortic root and ascending aorta are structurally normal, with  no evidence of dilitation.   IAS/Shunts: No atrial level shunt detected by color flow Doppler.   TELEMETRY reviewed by me (LT) 08/06/2022 : Sinus rhythm 60s to 70s occasional PVCs  EKG reviewed by me: Shows sinus rhythm with a nonspecific IVCD rate of 70 and occasional PVCs  Data reviewed by me (LT) 08/06/2022: hospitalist progress note, CBC, BMP vitals telemetry BNP I's and O's  Principal Problem:   Acute on chronic hypoxic respiratory failure (HCC) Active Problems:   Coronary artery disease involving native coronary artery of native heart without angina pectoris   Lambert-Eaton myasthenic syndrome (HCC)   Benign prostatic hyperplasia   Acute on chronic systolic CHF (congestive heart failure) ([place])   Dyslipidemia    ASSESSMENT AND PLAN:  [name] is a 88 year old male with a history of HFrEF (EF 25-30% with multiple WMAs 05/2022), mild AS, mod-severe MR, CAD (CTO LAD, moderate to severe disease in OM, recent NSTEMI 05/2022, medically managed), moderate pulmonary, hypertension, myasthenia gravis who presented to [facility] ED 08/05/2022 in respiratory distress. Cardiology is consulted for further assistance with his heart failure.   #Acute on chronic HFrEF (EF 25-30% with multiple WMA's 05/2022) Presents with acute respiratory distress in the setting of slight worsening dyspnea on exertion for 3 days's and a 4 pound weight gain prior to presentation, suspect dietary noncompliance ([employer] country ham biscuit that he ate on Sunday) a significant cause of his flash pulmonary  edema mission, requiring BiPAP in the emergency department. On admission, BNP is elevated to 1500, and has bibasilar crackles to  auscultation on exam with an oxygen requirement of 5 L with not at baseline. Diuresing well (net neg 2.6L) and O2 weaned to 3L on 11/8.  -S/p IV Lasix 40 mg IV x4, continue this for now.  -Beta-blocker is contraindicated with his history of myasthenia gravis -At baseline he is rather hypotensive, intolerant of GDMT with ACE inhibitor, Entresto, MRA.  Discussed possibility of adding GLP-1/SGLT2 at some point pending clinical course, daughter seems to have lots of concerns via the cost -Strict I's/O, daily weights, salt and fluid restriction -reinforced low salt diet, encouraged activity as tolerated at home -anticipate need for another 2-3 doses of IV lasix to get him back to baseline  #CAD without chest pain, recent NSTEMI medically managed 05/2022 #Elevated troponin Minimally elevated with a flat trend in the absence of chest pain this is most consistent with demand/supply mismatch and not ACS  -Continue DAPT with aspirin and Plavix until at least March 2024, ideally longer  #Myasthenia gravis On CellCept and pyridostigmine  This patient's plan of care was discussed and created with Dr. Saralyn Pilar and he is in agreement.  Signed: Tristan Schroeder , PA-C 08/06/2022, 11:16 AM [facility]

## 2022-08-06 NOTE — Progress Notes (Signed)
Nutrition Follow-up  DOCUMENTATION CODES:   Not applicable  INTERVENTION:   -D/c Ensure Enlive po BID, each supplement provides 350 kcal and 20 grams of protein -Magic cup BID with meals, each supplement provides 290 kcal and 9 grams of protein  -Continue MVI with minerals daily  NUTRITION DIAGNOSIS:   Increased nutrient needs related to chronic illness (CHF) as evidenced by estimated needs.  Ongoing  GOAL:   Patient will meet greater than or equal to 90% of their needs  Progressing  MONITOR:   PO intake, Supplement acceptance  REASON FOR ASSESSMENT:   Rounds    ASSESSMENT:   Pt with medical history significant for asthma, coronary artery disease, hypertension, dyslipidemia, Lambert-Eaton myasthenic syndrome presented  with acute onset of worsening dyspnea for the last couple of days with associated weight gain of 4 pounds over the last 3 days without significant cough or wheezing  Reviewed I/O's: -2.1 L x 24 hours and -2.8 L since admission  UOP: 2.1 L x 24 hours  Spoke with pt and daughter at bedside. Pt pleasant and in good spirits today. He reports he has a good appetite and has been consuming most of his meals. Per daughter, pt usually consumes 2 large meals (breakfast and lunch) and sometimes will eat a small snack such as an apple or peanut butter and crackers in the evening. Pt is very active and independent; he lives by himself and enjoys yard work and restoring furniture. He still drives and admits to eating a country ham biscuit from [employer], which is not common behavior for him.   Pt denies any weight loss, sharing "I think I've gained weight from eating too much". Reviewed wt hx; pt has experienced a 5% wt loss over the past year, which is not significant for time frame.   Discussed importance of good meal intake to promote healing.   Medications reviewed and include lasix.   Labs reviewed: CBGS: 152 (inpatient orders for glycemic control are  none).    NUTRITION - FOCUSED PHYSICAL EXAM:  Flowsheet Row Most Recent Value  Orbital Region No depletion  Upper Arm Region Mild depletion  Thoracic and Lumbar Region No depletion  Buccal Region No depletion  Temple Region No depletion  Clavicle Bone Region Mild depletion  Clavicle and Acromion Bone Region Mild depletion  Scapular Bone Region Mild depletion  Dorsal Hand No depletion  Patellar Region No depletion  Anterior Thigh Region No depletion  Posterior Calf Region No depletion  Edema (RD Assessment) Mild  Hair Reviewed  Eyes Reviewed  Mouth Reviewed  Skin Reviewed  Nails Reviewed       Diet Order:   Diet Order             Diet 2 gram sodium Room service appropriate? Yes; Fluid consistency: Thin  Diet effective now                   EDUCATION NEEDS:   Education needs have been addressed  Skin:  Skin Assessment: Reviewed RN Assessment  Last BM:  08/05/22  Height:   Ht Readings from Last 1 Encounters:  08/04/22 '5\' 10"'$  (1.778 m)    Weight:   Wt Readings from Last 1 Encounters:  08/06/22 85.6 kg    Ideal Body Weight:  75.5 kg  BMI:  Body mass index is 27.08 kg/m.  Estimated Nutritional Needs:   Kcal:  1850-2050  Protein:  100-115 grams  Fluid:  > 1.8 L    Shanti Eichel W,  RD, LDN, [place] Registered Dietitian II Certified Diabetes Care and Education Specialist Please refer to [facility] for RD and/or RD on-call/weekend/after hours pager

## 2022-08-06 NOTE — Progress Notes (Signed)
Progress Note    [name]   HWE:[id number]  DOB: [birth year]  DOA: 08/04/2022     2 PCP: [name], MD  Initial CC: SOB  Hospital Course: Mr. Deremer is a 88 year old male with asthma, CAD, HTN, HLP, Lambert-Eaton myasthenic syndrome who presented to ED with acute onset of worsening dyspnea. He had associated weight gain as well without significant cough or wheezing.  Dyspnea on exertion was noted to be worse.  Due to work of breathing in the ER, he was placed on BiPAP. He has not been on continuous oxygen at home however does have oxygen on hand from prior needs from previous hospitalizations.  His daughter states that he does appear to warrant oxygen sometimes at night. CXR on admission showed interstitial pulmonary edema with bilateral pleural effusions. Cardiology was consulted and he was also started on IV Lasix.  Interval History:  No events overnight.  Daughter present bedside when seen this morning.  Patient's breathing has been improving since admission.  He has been voiding well with Lasix.  Assessment and Plan:  Acute on chronic systolic CHF (congestive heart failure) (HCC) -CXR with cardiomegaly and pulmonary edema, BNP 1547, + troponins likely due to demand ischemia from acute on chronic systolic CHF -Recent 2D echo in 05/2022 had shown EF of 25 to 30% -IV Lasix as per cardiology, appreciate assistance - Continue trending BMP while on IV Lasix  Acute on chronic hypoxic respiratory failure ([place]) -Likely more so due to acute CHF exacerbation.  History of [name] relatively stable -Continue FVC and NIFs - walk test performed.  Still requiring 1 L with exertion.  Home O2 ordered   Lambert-Eaton myasthenic syndrome (HCC) -Continue CellCept, pyridostigmine   Dyslipidemia -Continue statin   Coronary artery disease  -Continue aspirin, Plavix, statin   Benign prostatic hyperplasia -Continue Flomax and finasteride   Old records reviewed in assessment  of this patient  Antimicrobials:   DVT prophylaxis:  enoxaparin (LOVENOX) injection 40 mg Start: 08/04/22 2200   Code Status:   Code Status: Full Code  Mobility Assessment (last 72 hours)     Mobility Assessment     Row Name 08/06/22 0930 08/05/22 1657 08/05/22 03:16:35       Does patient have an order for bedrest or is patient medically unstable No - Continue assessment No - Continue assessment No - Continue assessment     What is the highest level of mobility based on the progressive mobility assessment? Level 3 (Stands with assist) - Balance while standing  and cannot march in place Level 3 (Stands with assist) - Balance while standing  and cannot march in place Level 4 (Walks with assist in room) - Balance while marching in place and cannot step forward and back - Complete     Is the above level different from baseline mobility prior to current illness? Yes - Recommend PT order Yes - Recommend PT order --              Barriers to discharge:  Disposition Plan:  Home  Status is: Inpt  Objective: Blood pressure (!) 112/57, pulse 63, temperature 98.2 F (36.8 C), temperature source Oral, resp. rate 19, height '5\' 10"'$  (1.778 m), weight 85.6 kg, SpO2 95 %.  Examination:  Physical Exam Constitutional:      General: He is not in acute distress.    Appearance: He is well-developed.  HENT:     Head: Normocephalic and atraumatic.     Mouth/Throat:  Mouth: Mucous membranes are moist.  Eyes:     Extraocular Movements: Extraocular movements intact.  Cardiovascular:     Rate and Rhythm: Normal rate.  Pulmonary:     Effort: Pulmonary effort is normal.     Breath sounds: Normal breath sounds. No wheezing.  Abdominal:     General: Bowel sounds are normal. There is no distension.     Palpations: Abdomen is soft.  Musculoskeletal:        General: Swelling present.     Cervical back: Normal range of motion and neck supple.  Skin:    General: Skin is warm and dry.   Neurological:     General: No focal deficit present.     Mental Status: He is alert.  Psychiatric:        Mood and Affect: Mood normal.      Consultants:  Cardiology  Procedures:    Data Reviewed: Results for orders placed or performed during the hospital encounter of 08/04/22 (from the past 24 hour(s))  CBC     Status: Abnormal   Collection Time: 08/06/22  4:59 AM  Result Value Ref Range   WBC 6.7 4.0 - 10.5 K/uL   RBC 4.07 (L) 4.22 - 5.81 MIL/uL   Hemoglobin 11.6 (L) 13.0 - 17.0 g/dL   HCT 38.8 (L) 39.0 - 52.0 %   MCV 95.3 80.0 - 100.0 fL   MCH 28.5 26.0 - 34.0 pg   MCHC 29.9 (L) 30.0 - 36.0 g/dL   RDW 15.5 11.5 - 15.5 %   Platelets 135 (L) 150 - 400 K/uL   nRBC 0.0 0.0 - 0.2 %  Basic metabolic panel     Status: Abnormal   Collection Time: 08/06/22  4:59 AM  Result Value Ref Range   Sodium 143 135 - 145 mmol/L   Potassium 3.3 (L) 3.5 - 5.1 mmol/L   Chloride 100 98 - 111 mmol/L   CO2 35 (H) 22 - 32 mmol/L   Glucose, Bld 118 (H) 70 - 99 mg/dL   BUN 26 (H) 8 - 23 mg/dL   Creatinine, Ser 1.26 (H) 0.61 - 1.24 mg/dL   Calcium 8.8 (L) 8.9 - 10.3 mg/dL   GFR, Estimated 53 (L) >60 mL/min   Anion gap 8 5 - 15    I have Reviewed nursing notes, Vitals, and Lab results since pt's last encounter. Pertinent lab results : see above I have ordered test including BMP, CBC, Mg I have reviewed the last note from staff over past 24 hours I have discussed pt's care plan and test results with nursing staff, case manager   LOS: 2 days   Dwyane Dee, MD [facility] 08/06/2022, 5:19 PM

## 2022-08-06 NOTE — Progress Notes (Signed)
NIF x 3  -29

## 2022-08-07 ENCOUNTER — Other Ambulatory Visit (HOSPITAL_COMMUNITY): Payer: Self-pay

## 2022-08-07 ENCOUNTER — Encounter: Payer: Medicare Other | Admitting: Occupational Therapy

## 2022-08-07 DIAGNOSIS — J9621 Acute and chronic respiratory failure with hypoxia: Secondary | ICD-10-CM | POA: Diagnosis not present

## 2022-08-07 DIAGNOSIS — I5023 Acute on chronic systolic (congestive) heart failure: Secondary | ICD-10-CM | POA: Diagnosis not present

## 2022-08-07 LAB — BASIC METABOLIC PANEL
Anion gap: 9 (ref 5–15)
BUN: 26 mg/dL — ABNORMAL HIGH (ref 8–23)
CO2: 35 mmol/L — ABNORMAL HIGH (ref 22–32)
Calcium: 8.9 mg/dL (ref 8.9–10.3)
Chloride: 99 mmol/L (ref 98–111)
Creatinine, Ser: 1.01 mg/dL (ref 0.61–1.24)
GFR, Estimated: >60 mL/min (ref >60)
Glucose, Bld: 102 mg/dL — ABNORMAL HIGH (ref 70–99)
Potassium: 3.6 mmol/L (ref 3.5–5.1)
Sodium: 143 mmol/L (ref 135–145)

## 2022-08-07 LAB — URINE CULTURE
Culture: 20000 — AB
Special Requests: NORMAL

## 2022-08-07 LAB — CBC WITH DIFFERENTIAL/PLATELET
Abs Immature Granulocytes: 0.02 10*3/uL (ref 0.00–0.07)
Basophils Absolute: 0 10*3/uL (ref 0.0–0.1)
Basophils Relative: 1 %
Eosinophils Absolute: 0.1 10*3/uL (ref 0.0–0.5)
Eosinophils Relative: 2 %
HCT: 38.2 % — ABNORMAL LOW (ref 39.0–52.0)
Hemoglobin: 11.6 g/dL — ABNORMAL LOW (ref 13.0–17.0)
Immature Granulocytes: 0 %
Lymphocytes Relative: 14 %
Lymphs Abs: 0.8 10*3/uL (ref 0.7–4.0)
MCH: 28.6 pg (ref 26.0–34.0)
MCHC: 30.4 g/dL (ref 30.0–36.0)
MCV: 94.1 fL (ref 80.0–100.0)
Monocytes Absolute: 0.5 10*3/uL (ref 0.1–1.0)
Monocytes Relative: 9 %
Neutro Abs: 4 10*3/uL (ref 1.7–7.7)
Neutrophils Relative %: 74 %
Platelets: 153 10*3/uL (ref 150–400)
RBC: 4.06 MIL/uL — ABNORMAL LOW (ref 4.22–5.81)
RDW: 15.2 % (ref 11.5–15.5)
WBC: 5.4 10*3/uL (ref 4.0–10.5)
nRBC: 0 % (ref 0.0–0.2)

## 2022-08-07 LAB — MAGNESIUM: Magnesium: 2.2 mg/dL (ref 1.7–2.4)

## 2022-08-07 MED ORDER — FUROSEMIDE 40 MG PO TABS: 40.0000 mg | ORAL_TABLET | Freq: Every day | ORAL | Status: DC

## 2022-08-07 MED ORDER — POTASSIUM CHLORIDE CRYS ER 20 MEQ PO TBCR
40.0000 meq | EXTENDED_RELEASE_TABLET | Freq: Once | ORAL | Status: AC
  Administered 2022-08-07: 40 meq via ORAL
  Filled 2022-08-07: qty 2

## 2022-08-07 MED ORDER — FUROSEMIDE 10 MG/ML IJ SOLN
40.0000 mg | Freq: Two times a day (BID) | INTRAMUSCULAR | Status: AC
  Administered 2022-08-07: 40 mg via INTRAVENOUS
  Filled 2022-08-07: qty 4

## 2022-08-07 NOTE — Care Management Important Message (Signed)
Important Message  Patient Details  Name: [name] MRN: [id number] Date of Birth: Nov 02, 1925   Medicare Important Message Given:  Yes     Dannette Barbara 08/07/2022, 2:30 PM

## 2022-08-07 NOTE — Progress Notes (Signed)
NIF -28 SITTING IN CHAIR WITH GOOD EFFORT.

## 2022-08-07 NOTE — Discharge Summary (Signed)
Physician Discharge Summary   [name] CNO:[id number] DOB: 1926/06/18 DOA: 08/04/2022  PCP: [name], MD  Admit date: 08/04/2022 Discharge date: 08/07/2022 Barriers to discharge: none  Admitted From: Home Disposition:  Home Discharging physician: Dwyane Dee, MD  Recommendations for Outpatient Follow-up:  Follow-up with cardiology  Home Health:  Equipment/Devices: Oxygen  Discharge Condition: stable CODE STATUS: Full Diet recommendation:  Diet Orders (From admission, onward)     Start     Ordered   08/07/22 0000  Diet - low sodium heart healthy        08/07/22 1621   08/05/22 0854  Diet 2 gram sodium Room service appropriate? Yes; Fluid consistency: Thin  Diet effective now       Question Answer Comment  Room service appropriate? Yes   Fluid consistency: Thin      08/05/22 0853            Hospital Course: [name] is a 88 year old male with asthma, CAD, HTN, HLP, Lambert-Eaton myasthenic syndrome who presented to ED with acute onset of worsening dyspnea. He had associated weight gain as well without significant cough or wheezing.  Dyspnea on exertion was noted to be worse.  Due to work of breathing in the ER, he was placed on BiPAP. He has not been on continuous oxygen at home however does have oxygen on hand from prior needs from previous hospitalizations.  His daughter states that he does appear to warrant oxygen sometimes at night. CXR on admission showed interstitial pulmonary edema with bilateral pleural effusions. Cardiology was consulted and he was also started on IV Lasix.  Assessment and Plan:  Acute on chronic systolic CHF (congestive heart failure) (HCC) -CXR with cardiomegaly and pulmonary edema, BNP 1547, + troponins likely due to demand ischemia from acute on chronic systolic CHF -Recent 2D echo in 05/2022 had shown EF of 25 to 30% -IV Lasix as per cardiology, appreciate assistance -Resumed on oral Lasix at discharge   Acute on  chronic hypoxic respiratory failure ([place]) -Likely more so due to acute CHF exacerbation.  History of [name] relatively stable -Continue FVC and NIFs - walk test performed.  Still requiring 1 L with exertion.  Home O2 ordered   Lambert-Eaton myasthenic syndrome (HCC) -Continue CellCept, pyridostigmine   Dyslipidemia -Continue statin   Coronary artery disease  -Continue aspirin, Plavix, statin   Benign prostatic hyperplasia -Continue Flomax and finasteride    The patient's chronic medical conditions were treated accordingly per the patient's home medication regimen except as noted.  On day of discharge, patient was felt deemed stable for discharge. Patient/family member advised to call PCP or come back to ER if needed.   Principal Diagnosis: Acute on chronic hypoxic respiratory failure [facility])  Discharge Diagnoses: Active Hospital Problems   Diagnosis Date Noted   Acute on chronic hypoxic respiratory failure ([place]) 12/27/2016    Priority: 1.   Acute on chronic systolic CHF (congestive heart failure) ([place]) 08/04/2022    Priority: 2.   Lambert-Eaton myasthenic syndrome ([place]) 03/31/2011    Priority: 3.   Dyslipidemia 08/05/2022    Priority: 5.   Coronary artery disease involving native coronary artery of native heart without angina pectoris 06/18/2014    Priority: 6.   Benign prostatic hyperplasia 09/28/2021    Priority: 7.    Resolved Hospital Problems  No resolved problems to display.     Discharge Instructions     Diet - low sodium heart healthy   Complete by: As directed  Increase activity slowly   Complete by: As directed       Allergies as of 08/07/2022       Reactions   Beta Adrenergic Blockers Other (See Comments)   Beta Blocker will precipitate acute weakness in Lambert-Eaton Syndrome or Myasthenia Gravis   Calcium Channel Blockers Other (See Comments)   Calcium Channel Blocker- will precipitate acute weakness in Lambert-Eaton Syndrome.   Diltiazem  Other (See Comments)   Ciprofloxacin Other (See Comments)   Last resort due to Myasthia Gravis   Sulfa Antibiotics Rash        Medication List     TAKE these medications    ARTHRITIS PAIN PO Take 650 mg by mouth.   aspirin 81 MG tablet Take 81 mg by mouth daily.   clopidogrel 75 MG tablet Commonly known as: PLAVIX Take 1 tablet (75 mg total) by mouth daily. Total at least 6 mos therapy w/ clopidogrel + aspirin   docusate sodium 100 MG capsule Commonly known as: COLACE Take 1 capsule (100 mg total) by mouth 2 (two) times daily as needed for mild constipation.   finasteride 5 MG tablet Commonly known as: PROSCAR Take 5 mg by mouth daily.   furosemide 40 MG tablet Commonly known as: LASIX Take 1 tablet (40 mg total) by mouth daily.   hydroxypropyl methylcellulose / hypromellose 2.5 % ophthalmic solution Commonly known as: ISOPTO TEARS / GONIOVISC Place 1 drop into both eyes daily.   mycophenolate 250 MG capsule Commonly known as: CELLCEPT Take 1,000 mg by mouth 2 (two) times daily.   polyethylene glycol 17 g packet Commonly known as: MIRALAX / GLYCOLAX Take 17 g by mouth daily as needed for moderate constipation.   pravastatin 20 MG tablet Commonly known as: PRAVACHOL Take 1 tablet (20 mg total) by mouth daily.   pyridostigmine 60 MG tablet Commonly known as: MESTINON Take 30 mg by mouth 6 (six) times daily.   tacrolimus 0.1 % ointment Commonly known as: Protopic Apply topically 2 (two) times daily.   tamsulosin 0.4 MG Caps capsule Commonly known as: FLOMAX Take 0.4 mg by mouth daily.               Durable Medical Equipment  (From admission, onward)           Start     Ordered   08/06/22 1430  For home use only DME oxygen  Once       Question Answer Comment  Length of Need 6 Months   Mode or (Route) Nasal cannula   Liters per Minute 1   Frequency Continuous (stationary and portable oxygen unit needed)   Oxygen conserving device Yes    Oxygen delivery system Gas      08/06/22 1430            Follow-up Information     Paraschos, Alexander, MD. Go in 6 day(s).   Specialty: Cardiology Why: has appt scheduled for 11/15 at 10:45am Contact information: [facility] [place] [id number] [phone]                Allergies  Allergen Reactions   Beta Adrenergic Blockers Other (See Comments)    Beta Blocker will precipitate acute weakness in Lambert-Eaton Syndrome or Myasthenia Gravis   Calcium Channel Blockers Other (See Comments)    Calcium Channel Blocker- will precipitate acute weakness in Lambert-Eaton Syndrome.   Diltiazem Other (See Comments)   Ciprofloxacin Other (See Comments)    Last resort  due to Myasthia Gravis   Sulfa Antibiotics Rash    Consultations: Cardiology  Procedures:   Discharge Exam: BP 99/60 (BP Location: Right Arm)   Pulse 69   Temp 98.4 F (36.9 C) (Oral)   Resp 18   Ht '5\' 10"'$  (1.778 m)   Wt 83.8 kg   SpO2 98%   BMI 26.51 kg/m  Physical Exam Constitutional:      General: He is not in acute distress.    Appearance: He is well-developed.  HENT:     Head: Normocephalic and atraumatic.     Mouth/Throat:     Mouth: Mucous membranes are moist.  Eyes:     Extraocular Movements: Extraocular movements intact.  Cardiovascular:     Rate and Rhythm: Normal rate.  Pulmonary:     Effort: Pulmonary effort is normal.     Breath sounds: Normal breath sounds. No wheezing.  Abdominal:     General: Bowel sounds are normal. There is no distension.     Palpations: Abdomen is soft.  Musculoskeletal:        General: Swelling present.     Cervical back: Normal range of motion and neck supple.  Skin:    General: Skin is warm and dry.  Neurological:     General: No focal deficit present.     Mental Status: He is alert.  Psychiatric:        Mood and Affect: Mood normal.      The results of significant diagnostics from this  hospitalization (including imaging, microbiology, ancillary and laboratory) are listed below for reference.   Microbiology: Recent Results (from the past 240 hour(s))  SARS Coronavirus 2 by RT PCR (hospital order, performed in [facility] hospital lab) *cepheid single result test* Anterior Nasal Swab     Status: None   Collection Time: 08/04/22  7:05 PM   Specimen: Anterior Nasal Swab  Result Value Ref Range Status   SARS Coronavirus 2 by RT PCR NEGATIVE NEGATIVE Final    Comment: (NOTE) SARS-CoV-2 target nucleic acids are NOT DETECTED.  The SARS-CoV-2 RNA is generally detectable in upper and lower respiratory specimens during the acute phase of infection. The lowest concentration of SARS-CoV-2 viral copies this assay can detect is 250 copies / mL. A negative result does not preclude SARS-CoV-2 infection and should not be used as the sole basis for treatment or other patient management decisions.  A negative result may occur with improper specimen collection / handling, submission of specimen other than nasopharyngeal swab, presence of viral mutation(s) within the areas targeted by this assay, and inadequate number of viral copies (<250 copies / mL). A negative result must be combined with clinical observations, patient history, and epidemiological information.  Fact Sheet for Patients:   https://www.patel.info/  Fact Sheet for Healthcare Providers: https://hall.com/  This test is not yet approved or  cleared by the [place] FDA and has been authorized for detection and/or diagnosis of SARS-CoV-2 by FDA under an Emergency Use Authorization (EUA).  This EUA will remain in effect (meaning this test can be used) for the duration of the COVID-19 declaration under Section 564(b)(1) of the Act, 21 U.S.C. section 360bbb-3(b)(1), unless the authorization is terminated or revoked sooner.  Performed at [facility], [address], [place], [place] [address]   Urine Culture     Status: Abnormal   Collection Time: 08/05/22  9:25 AM   Specimen: Urine, Clean Catch  Result Value Ref Range Status   Specimen Description  Final    URINE, CLEAN CATCH Performed at [facility], [address] [place], [place] [address]    Special Requests   Final    Normal Performed at [facility], [place],  [address]    Culture 20,000 COLONIES/mL PROTEUS MIRABILIS (A)  Final   Report Status 08/07/2022 FINAL  Final   Organism ID, Bacteria PROTEUS MIRABILIS (A)  Final      Susceptibility   Proteus mirabilis - MIC*    AMPICILLIN <=2 SENSITIVE Sensitive     CEFAZOLIN 8 SENSITIVE Sensitive     CEFEPIME <=0.12 SENSITIVE Sensitive     CEFTRIAXONE <=0.25 SENSITIVE Sensitive     CIPROFLOXACIN <=0.25 SENSITIVE Sensitive     GENTAMICIN <=1 SENSITIVE Sensitive     IMIPENEM 2 SENSITIVE Sensitive     NITROFURANTOIN 128 RESISTANT Resistant     TRIMETH/SULFA <=20 SENSITIVE Sensitive     AMPICILLIN/SULBACTAM <=2 SENSITIVE Sensitive     PIP/TAZO <=4 SENSITIVE Sensitive     * 20,000 COLONIES/mL PROTEUS MIRABILIS     Labs: BNP (last 3 results) Recent Labs    10/01/21 0905 06/14/22 1526 08/04/22 1712  BNP 580.4* 755.9* 2,751.7*   Basic Metabolic Panel: Recent Labs  Lab 08/04/22 1705 08/05/22 0435 08/06/22 0459 08/07/22 0547  NA 143 143 143 143  K 3.9 3.8 3.3* 3.6  CL 103 104 100 99  CO2 31 31 35* 35*  GLUCOSE 119* 101* 118* 102*  BUN 23 23 26* 26*  CREATININE 0.98 0.95 1.26* 1.01  CALCIUM 9.3 8.6* 8.8* 8.9  MG  --   --   --  2.2   Liver Function Tests: Recent Labs  Lab 08/04/22 1705  AST 23  ALT 25  ALKPHOS 88  BILITOT 1.6*  PROT 7.3  ALBUMIN 4.3   No results for input(s): "LIPASE", "AMYLASE" in the last 168 hours. No results for input(s): "AMMONIA" in the last 168 hours. CBC: Recent Labs  Lab 08/04/22 1705 08/05/22 0435 08/06/22 0459 08/07/22 0547  WBC 8.1 7.8 6.7  5.4  NEUTROABS 6.9  --   --  4.0  HGB 13.1 12.5* 11.6* 11.6*  HCT 43.1 41.6 38.8* 38.2*  MCV 94.7 95.4 95.3 94.1  PLT 179 160 135* 153   Cardiac Enzymes: No results for input(s): "CKTOTAL", "CKMB", "CKMBINDEX", "TROPONINI" in the last 168 hours. BNP: Invalid input(s): "POCBNP" CBG: No results for input(s): "GLUCAP" in the last 168 hours. D-Dimer No results for input(s): "DDIMER" in the last 72 hours. Hgb A1c No results for input(s): "HGBA1C" in the last 72 hours. Lipid Profile No results for input(s): "CHOL", "HDL", "LDLCALC", "TRIG", "CHOLHDL", "LDLDIRECT" in the last 72 hours. Thyroid function studies No results for input(s): "TSH", "T4TOTAL", "T3FREE", "THYROIDAB" in the last 72 hours.  Invalid input(s): "FREET3" Anemia work up No results for input(s): "VITAMINB12", "FOLATE", "FERRITIN", "TIBC", "IRON", "RETICCTPCT" in the last 72 hours. Urinalysis    Component Value Date/Time   COLORURINE YELLOW (A) 08/05/2022 0925   APPEARANCEUR CLEAR (A) 08/05/2022 0925   LABSPEC 1.014 08/05/2022 0925   PHURINE 5.0 08/05/2022 0925   GLUCOSEU NEGATIVE 08/05/2022 0925   GLUCOSEU NEGATIVE 06/30/2019 1022   HGBUR SMALL (A) 08/05/2022 0925   BILIRUBINUR NEGATIVE 08/05/2022 0925   BILIRUBINUR n 08/30/2012 1006   KETONESUR NEGATIVE 08/05/2022 0925   PROTEINUR NEGATIVE 08/05/2022 0925   UROBILINOGEN 0.2 06/30/2019 1022   NITRITE NEGATIVE 08/05/2022 0925   LEUKOCYTESUR NEGATIVE 08/05/2022 0925   Sepsis Labs Recent Labs  Lab  08/04/22 1705 08/05/22 0435 08/06/22 0459 08/07/22 0547  WBC 8.1 7.8 6.7 5.4   Microbiology Recent Results (from the past 240 hour(s))  SARS Coronavirus 2 by RT PCR (hospital order, performed in [facility] hospital lab) *cepheid single result test* Anterior Nasal Swab     Status: None   Collection Time: 08/04/22  7:05 PM   Specimen: Anterior Nasal Swab  Result Value Ref Range Status   SARS Coronavirus 2 by RT PCR NEGATIVE NEGATIVE Final    Comment:  (NOTE) SARS-CoV-2 target nucleic acids are NOT DETECTED.  The SARS-CoV-2 RNA is generally detectable in upper and lower respiratory specimens during the acute phase of infection. The lowest concentration of SARS-CoV-2 viral copies this assay can detect is 250 copies / mL. A negative result does not preclude SARS-CoV-2 infection and should not be used as the sole basis for treatment or other patient management decisions.  A negative result may occur with improper specimen collection / handling, submission of specimen other than nasopharyngeal swab, presence of viral mutation(s) within the areas targeted by this assay, and inadequate number of viral copies (<250 copies / mL). A negative result must be combined with clinical observations, patient history, and epidemiological information.  Fact Sheet for Patients:   https://www.patel.info/  Fact Sheet for Healthcare Providers: https://hall.com/  This test is not yet approved or  cleared by the [place] FDA and has been authorized for detection and/or diagnosis of SARS-CoV-2 by FDA under an Emergency Use Authorization (EUA).  This EUA will remain in effect (meaning this test can be used) for the duration of the COVID-19 declaration under Section 564(b)(1) of the Act, 21 U.S.C. section 360bbb-3(b)(1), unless the authorization is terminated or revoked sooner.  Performed at [facility], [address] [place], [place] [address]   Urine Culture     Status: Abnormal   Collection Time: 08/05/22  9:25 AM   Specimen: Urine, Clean Catch  Result Value Ref Range Status   Specimen Description   Final    URINE, CLEAN CATCH Performed at [facility], [address], [place], [place] [address]    Special Requests   Final    Normal Performed at [facility], [place], [place] [address]    Culture 20,000 COLONIES/mL PROTEUS MIRABILIS (A)  Final    Report Status 08/07/2022 FINAL  Final   Organism ID, Bacteria PROTEUS MIRABILIS (A)  Final      Susceptibility   Proteus mirabilis - MIC*    AMPICILLIN <=2 SENSITIVE Sensitive     CEFAZOLIN 8 SENSITIVE Sensitive     CEFEPIME <=0.12 SENSITIVE Sensitive     CEFTRIAXONE <=0.25 SENSITIVE Sensitive     CIPROFLOXACIN <=0.25 SENSITIVE Sensitive     GENTAMICIN <=1 SENSITIVE Sensitive     IMIPENEM 2 SENSITIVE Sensitive     NITROFURANTOIN 128 RESISTANT Resistant     TRIMETH/SULFA <=20 SENSITIVE Sensitive     AMPICILLIN/SULBACTAM <=2 SENSITIVE Sensitive     PIP/TAZO <=4 SENSITIVE Sensitive     * 20,000 COLONIES/mL PROTEUS MIRABILIS    Procedures/Studies: DG Chest 2 View  Result Date: 08/04/2022 CLINICAL DATA:  Shortness of breath EXAM: CHEST - 2 VIEW COMPARISON:  06/15/2022 FINDINGS: Small bilateral pleural effusions. Cardiomegaly with central congestion and mild diffuse interstitial pulmonary edema. Bibasilar partial consolidations. Aortic atherosclerosis. No pneumothorax IMPRESSION: Cardiomegaly with central congestion and mild interstitial pulmonary edema. Small bilateral pleural effusions with bibasilar atelectasis or pneumonia. Electronically Signed   By: Donavan Foil  M.D.   On: 08/04/2022 17:28     Time coordinating discharge: Over 60 minutes    Dwyane Dee, MD  [facility] 08/07/2022, 4:47 PM

## 2022-08-07 NOTE — TOC Progression Note (Signed)
Transition of Care [facility]) - Progression Note    Patient Details  Name: [name] MRN: [id number] Date of Birth: 04-26-1926  [facility] of [facility] CM/SW [place], [place] Phone Number: 08/07/2022, 10:48 AM  Clinical Narrative:     CSW spoke with patient's daughter who confirms [facility] however she will need to call them back. She reports she wishes for patient to be discharged with continuing to use [employer] home O2. Will need O2 delivered to hospital room, and additional home set up with tubing.   CSW has contacted Suanne Marker with [employer] to inform of the above.   No other noted dc needs at this time.   Expected Discharge Plan: [place] Barriers to Discharge: Continued Medical Work up  Expected Discharge Plan and Services Expected Discharge Plan: [place] arrangements for the past 2 months: Single Family Home                                       Social Determinants of Health (SDOH) Interventions    Readmission Risk Interventions     No data to display

## 2022-08-07 NOTE — Progress Notes (Signed)
[name] NOTE       Patient ID: [name] MRN: [id number] DOB/AGE: 1926/02/14 88 y.o.  Admit date: 08/04/2022 Referring Physician Dr. Eugenie Norrie Primary Physician Dr. [name]  Primary Cardiologist Dr. Nehemiah Massed Reason for Consultation AoCHF  HPI: [name] is a 88 year old male with a history of HFrEF (EF 25-30% with multiple WMAs 05/2022), mild AS, mod-severe MR, CAD (CTO LAD, moderate to severe disease in OM, recent NSTEMI 05/2022, medically managed), moderate pulmonary, hypertension, myasthenia gravis who presented to [facility] ED 08/05/2022 in respiratory distress. Cardiology is consulted for further assistance with his heart failure.   Interval History:  -feels better today in terms of his breathing. No chest pain. Slept "fair" last night. Persistent lymphedema in his legs that is relatively unchanged.  -continues to diurese well. Weight down ~7 lbs since last week per daughter.  -eager to go home   Review of systems complete and found to be negative unless listed above     Past Medical History:  Diagnosis Date   Abnormal chest CT 03/28/2017   Allergy    Anemia    Angina pectoris ([place]) 04/02/2011   Aortic aneurysm ([place]) 03/28/2017   Saw Dr Genevive Bi 04/16/17 - recommended f/u CT in 3 months.     Atherosclerosis of both carotid arteries 07/17/2014   Basal cell carcinoma 06/25/2021   Left ant neck. Nodulocystic pattern, close to margin   Benign lipomatous neoplasm of skin and subcutaneous tissue of left arm 10/04/2013   Bilateral carotid artery stenosis 08/30/2014   Overview:  Less than 50% 2015   Bladder cancer ([place]) 03/05/2013   CAD (coronary artery disease) 06/18/2014   Cancer ([place]) 11/24/2012   bladder. BCG treatments by Dr Jacqlyn Larsen.   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 WD SCC. Re-shaved 01/14/2016. Excised 02/05/2016, margins free.   Squamous cell carcinoma of skin 11/03/2018   Right cheek ant. to sideburn. Poorly differentiated.   Squamous cell carcinoma of skin 02/02/2019   Right mid dorsum forearm. WD SCC. EDC.    Past Surgical History:  Procedure Laterality Date   [place]    Medications Prior to Admission  Medication Sig Dispense Refill Last Dose   Acetaminophen (ARTHRITIS PAIN PO) Take 650 mg by mouth.   08/04/2022   aspirin 81 MG tablet Take 81 mg by mouth daily.   08/04/2022   clopidogrel (PLAVIX) 75 MG tablet Take 1 tablet (75 mg total) by mouth daily. Total at least 6 mos therapy w/ clopidogrel + aspirin 30 tablet 0 08/04/2022   docusate sodium (COLACE) 100 MG capsule Take 1 capsule (100 mg total)  by mouth 2 (two) times daily as needed for mild constipation. 10 capsule 0 08/04/2022   finasteride (PROSCAR) 5 MG tablet Take 5 mg by mouth daily.   08/04/2022   furosemide (LASIX) 40 MG tablet Take 1 tablet (40 mg total) by mouth daily. (Patient taking differently: Take 40 mg by mouth daily.) 90 tablet 3 08/04/2022   hydroxypropyl methylcellulose / hypromellose (ISOPTO TEARS / GONIOVISC) 2.5 % ophthalmic solution Place 1 drop into both eyes daily.   08/04/2022   mycophenolate (CELLCEPT) 250 MG  capsule Take 1,000 mg by mouth 2 (two) times daily.    08/04/2022   pravastatin (PRAVACHOL) 20 MG tablet Take 1 tablet (20 mg total) by mouth daily. 90 tablet 1 08/04/2022   pyridostigmine (MESTINON) 60 MG tablet Take 30 mg by mouth 6 (six) times daily.    08/04/2022   tacrolimus (PROTOPIC) 0.1 % ointment Apply topically 2 (two) times daily. 100 g 2 08/04/2022   tamsulosin (FLOMAX) 0.4 MG CAPS Take 0.4 mg by mouth daily.   08/04/2022   polyethylene glycol (MIRALAX / GLYCOLAX) 17 g packet Take 17 g by mouth daily as needed for moderate constipation. 14 each 0 prn at prn    Social History   Socioeconomic History   Marital status: Widowed    Spouse name: Not on file   Number of children: Not on file   Years of education: Not on file   Highest education level: Not on file  Occupational History   Not on file  Tobacco Use   Smoking status: Former    Types: Cigarettes    Quit date: 09/29/1958    Years since quitting: 63.8   Smokeless tobacco: Never  Vaping Use   Vaping Use: Never used  Substance and Sexual Activity   Alcohol use: Yes    Alcohol/week: 0.0 standard drinks of alcohol    Comment: occasionally   Drug use: No   Sexual activity: Never  Other Topics Concern   Not on file  Social History Narrative   Pt is married with 2 children - 1 son, 1 daughter. Previously self-employed in [place] Strain: [place]  (11/19/2021)   Overall Financial Resource Strain (CARDIA)    Difficulty of Paying Living Expenses: Not hard at all  Food Insecurity: No Food Insecurity (08/05/2022)   Hunger Vital Sign    Worried About Running Out of Food in the Last Year: Never true    Ran Out of Food in the Last Year: Never true  Transportation Needs: No Transportation Needs (08/05/2022)   PRAPARE - Hydrologist (Medical): No    Lack of Transportation (Non-Medical): No  Physical Activity: Not on file  Stress: No Stress  Concern Present (11/19/2021)   [place]    Feeling of Stress : Not at all  Social Connections: Unknown (11/19/2021)   Social Connection and Isolation Panel [NHANES]    Frequency of Communication with Friends and Family: More than three times a week    Frequency of Social Gatherings with Friends and Family: More than three times a week    Attends Religious Services: More than 4 times per year    Active Member of [employer] or Organizations: Not on file    Attends Archivist Meetings: Not on file    Marital Status: Widowed  Intimate Partner Violence: Not At Risk (08/05/2022)   Humiliation,  Afraid, Rape, and Kick questionnaire    Fear of Current or Ex-Partner: No    Emotionally Abused: No    Physically Abused: No    Sexually Abused: No    Family History  Problem Relation Age of Onset   Lung cancer Sister    Prostate cancer Brother    Lung cancer Brother    Arthritis Other        parent   Colon cancer Neg Hx      Vitals:   08/07/22 0516 08/07/22 0518 08/07/22 0750 08/07/22 1200  BP: 113/62  97/60 (!) 108/58  Pulse: (!) 59  63 68  Resp: '20  18 16  '$ Temp: (!) 97.5 F (36.4 C)  98.4 F (36.9 C) 98.6 F (37 C)  TempSrc:   Oral Oral  SpO2: 98%  98% 98%  Weight:  83.8 kg    Height:        PHYSICAL EXAM General: Pleasant elderly Caucasian male, well nourished, in no acute distress.  Sitting upright in recliner, daughter at bedside HEENT:  Normocephalic and atraumatic. Neck:  No JVD.  Lungs: Normal respiratory effort on 2 L by nasal cannula.  Trace Bibasilar crackles without appreciable wheezes, aeration continuing to improve. Heart: HRRR . Normal S1 and S2. 2/6 systolic murmur best heard at the RUSB.  Abdomen: Non-distended appearing.  Msk: Normal strength and tone for age. Extremities: Warm and well perfused. No clubbing, cyanosis.  Lymphedema in bilateral lower extremites with compression hose and two  additional layers of socks Psych:  Answers questions appropriately.   Labs: Basic Metabolic Panel: Recent Labs    08/06/22 0459 08/07/22 0547  NA 143 143  K 3.3* 3.6  CL 100 99  CO2 35* 35*  GLUCOSE 118* 102*  BUN 26* 26*  CREATININE 1.26* 1.01  CALCIUM 8.8* 8.9  MG  --  2.2    Liver Function Tests: Recent Labs    08/04/22 1705  AST 23  ALT 25  ALKPHOS 88  BILITOT 1.6*  PROT 7.3  ALBUMIN 4.3    No results for input(s): "LIPASE", "AMYLASE" in the last 72 hours. CBC: Recent Labs    08/04/22 1705 08/05/22 0435 08/06/22 0459 08/07/22 0547  WBC 8.1   < > 6.7 5.4  NEUTROABS 6.9  --   --  4.0  HGB 13.1   < > 11.6* 11.6*  HCT 43.1   < > 38.8* 38.2*  MCV 94.7   < > 95.3 94.1  PLT 179   < > 135* 153   < > = values in this interval not displayed.    Cardiac Enzymes: Recent Labs    08/04/22 1705 08/04/22 1905  TROPONINIHS 58* 64*    BNP: Recent Labs    08/04/22 1712  BNP 1,547.5*    D-Dimer: No results for input(s): "DDIMER" in the last 72 hours. Hemoglobin A1C: No results for input(s): "HGBA1C" in the last 72 hours. Fasting Lipid Panel: No results for input(s): "CHOL", "HDL", "LDLCALC", "TRIG", "CHOLHDL", "LDLDIRECT" in the last 72 hours. Thyroid Function Tests: No results for input(s): "TSH", "T4TOTAL", "T3FREE", "THYROIDAB" in the last 72 hours.  Invalid input(s): "FREET3" Anemia Panel: No results for input(s): "VITAMINB12", "FOLATE", "FERRITIN", "TIBC", "IRON", "RETICCTPCT" in the last 72 hours.   Radiology: DG Chest 2 View  Result Date: 08/04/2022 CLINICAL DATA:  Shortness of breath EXAM: CHEST - 2 VIEW COMPARISON:  06/15/2022 FINDINGS: Small bilateral pleural effusions. Cardiomegaly with central congestion and mild diffuse interstitial pulmonary edema. Bibasilar partial  consolidations. Aortic atherosclerosis. No pneumothorax IMPRESSION: Cardiomegaly with central congestion and mild interstitial pulmonary edema. Small bilateral pleural effusions  with bibasilar atelectasis or pneumonia. Electronically Signed   By: Donavan Foil M.D.   On: 08/04/2022 17:28    ECHO 05/2022  1. Left ventricular ejection fraction, by estimation, is 25 to 30%. The  left ventricle has severely decreased function. The left ventricle  demonstrates regional wall motion abnormalities (see scoring  diagram/findings for description). The left  ventricular internal cavity size was moderately dilated. Left ventricular  diastolic parameters were normal.   2. Right ventricular systolic function is normal. The right ventricular  size is normal.   3. Left atrial size was moderately dilated.   4. Right atrial size was moderately dilated.   5. The mitral valve is normal in structure. Moderate to severe mitral  valve regurgitation.   6. Tricuspid valve regurgitation is moderate.   7. The aortic valve is normal in structure. Aortic valve regurgitation is  trivial.   FINDINGS   Left Ventricle: Left ventricular ejection fraction, by estimation, is 25  to 30%. The left ventricle has severely decreased function. The left  ventricle demonstrates regional wall motion abnormalities. Severe akinesis  of the left ventricular, mid-apical  anteroseptal wall, apical segment and anterior segment. The left  ventricular internal cavity size was moderately dilated. There is no left  ventricular hypertrophy. Left ventricular diastolic parameters were  normal.   Right Ventricle: The right ventricular size is normal. No increase in  right ventricular wall thickness. Right ventricular systolic function is  normal.   Left Atrium: Left atrial size was moderately dilated.   Right Atrium: Right atrial size was moderately dilated.   Pericardium: There is no evidence of pericardial effusion.   Mitral Valve: The mitral valve is normal in structure. Moderate to severe  mitral valve regurgitation.   Tricuspid Valve: The tricuspid valve is normal in structure. Tricuspid  valve  regurgitation is moderate.   Aortic Valve: The aortic valve is normal in structure. Aortic valve  regurgitation is trivial. Aortic valve mean gradient measures 9.5 mmHg.  Aortic valve peak gradient measures 16.7 mmHg. Aortic valve area, by VTI  measures 1.28 cm.   Pulmonic Valve: The pulmonic valve was normal in structure. Pulmonic valve  regurgitation is trivial.   Aorta: The aortic root and ascending aorta are structurally normal, with  no evidence of dilitation.   IAS/Shunts: No atrial level shunt detected by color flow Doppler.   TELEMETRY reviewed by me (LT) 08/07/2022 : Sinus rhythm 60s to 70s occasional PVCs  EKG reviewed by me: Shows sinus rhythm with a nonspecific IVCD rate of 70 and occasional PVCs  Data reviewed by me (LT) 08/07/2022: hospitalist progress note, CBC, BMP vitals telemetry BNP I's and O's  Principal Problem:   Acute on chronic hypoxic respiratory failure (HCC) Active Problems:   Coronary artery disease involving native coronary artery of native heart without angina pectoris   Lambert-Eaton myasthenic syndrome (HCC)   Benign prostatic hyperplasia   Acute on chronic systolic CHF (congestive heart failure) ([place])   Dyslipidemia    ASSESSMENT AND PLAN:  [name] is a 88 year old male with a history of HFrEF (EF 25-30% with multiple WMAs 05/2022), mild AS, mod-severe MR, CAD (CTO LAD, moderate to severe disease in OM, recent NSTEMI 05/2022, medically managed), moderate pulmonary, hypertension, myasthenia gravis who presented to [facility] ED 08/05/2022 in respiratory distress. Cardiology is consulted for further assistance with his heart failure.   #Acute on  chronic HFrEF (EF 25-30% with multiple WMA's 05/2022) Presents with acute respiratory distress in the setting of slight worsening dyspnea on exertion for 3 days's and a 4 pound weight gain prior to presentation, suspect dietary noncompliance ([employer] country ham biscuit that he ate on Sunday) a significant cause  of his flash pulmonary edema mission, requiring BiPAP in the emergency department. On admission, BNP is elevated to 1500, and has bibasilar crackles to auscultation on exam with an oxygen requirement of 5 L with not at baseline. Diuresing well (net neg 2.6L) and O2 weaned to 3L on 11/8.  -S/p IV Lasix 40 mg IV x 6 with excellent diuresis, will give one more dose this afternoon before discharge.  -restart '40mg'$  PO lasix tomorrow.  -Beta-blocker is contraindicated with his history of myasthenia gravis -At baseline he is rather hypotensive, intolerant of GDMT with ACE inhibitor, Entresto, MRA.  Discussed possibility of adding GLP-1/SGLT2, daughter seems to have lots of concerns via the cost -Strict I's/O, daily weights, salt and fluid restriction, revisit this outpaitient. -reinforced low salt diet, encouraged activity as tolerated at home  #CAD without chest pain, recent NSTEMI medically managed 05/2022 #Elevated troponin Minimally elevated with a flat trend in the absence of chest pain this is most consistent with demand/supply mismatch and not ACS  -Continue DAPT with aspirin and Plavix until at least March 2024, ideally longer  #Myasthenia gravis On CellCept and pyridostigmine  OK for discharge from a cardiac standpoint this afternoon following dose of IV lasix at 1500. He has and appointment with Dr. Saralyn Pilar on Wednesday, 11/15 at 10:45am.   This patient's plan of care was discussed and created with Dr. Saralyn Pilar and he is in agreement.  Signed: Tristan Schroeder , PA-C 08/07/2022, 12:13 PM [facility] Cardiology

## 2022-08-08 ENCOUNTER — Other Ambulatory Visit: Payer: Self-pay

## 2022-08-08 ENCOUNTER — Emergency Department
Admission: EM | Admit: 2022-08-08 | Discharge: 2022-08-08 | Disposition: A | Discharge status: Home / Self Care | Payer: Medicare Other | Attending: Emergency Medicine | Admitting: Emergency Medicine

## 2022-08-08 DIAGNOSIS — R001 Bradycardia, unspecified: Secondary | ICD-10-CM | POA: Diagnosis not present

## 2022-08-08 DIAGNOSIS — I959 Hypotension, unspecified: Secondary | ICD-10-CM | POA: Diagnosis not present

## 2022-08-08 DIAGNOSIS — E860 Dehydration: Secondary | ICD-10-CM

## 2022-08-08 DIAGNOSIS — R0689 Other abnormalities of breathing: Secondary | ICD-10-CM | POA: Diagnosis not present

## 2022-08-08 DIAGNOSIS — R0902 Hypoxemia: Secondary | ICD-10-CM | POA: Diagnosis not present

## 2022-08-08 DIAGNOSIS — R531 Weakness: Secondary | ICD-10-CM | POA: Diagnosis present

## 2022-08-08 LAB — URINALYSIS, ROUTINE W REFLEX MICROSCOPIC
Bilirubin Urine: NEGATIVE
Glucose, UA: NEGATIVE mg/dL
Hgb urine dipstick: NEGATIVE
Ketones, ur: 5 mg/dL — AB
Leukocytes,Ua: NEGATIVE
Nitrite: NEGATIVE
Protein, ur: NEGATIVE mg/dL
Specific Gravity, Urine: 1.02 (ref 1.005–1.030)
pH: 5 (ref 5.0–8.0)

## 2022-08-08 LAB — CBC WITH DIFFERENTIAL/PLATELET
Abs Immature Granulocytes: 0.01 10*3/uL (ref 0.00–0.07)
Basophils Absolute: 0 10*3/uL (ref 0.0–0.1)
Basophils Relative: 1 %
Eosinophils Absolute: 0.1 10*3/uL (ref 0.0–0.5)
Eosinophils Relative: 2 %
HCT: 40.9 % (ref 39.0–52.0)
Hemoglobin: 12.4 g/dL — ABNORMAL LOW (ref 13.0–17.0)
Immature Granulocytes: 0 %
Lymphocytes Relative: 19 %
Lymphs Abs: 0.9 10*3/uL (ref 0.7–4.0)
MCH: 28.4 pg (ref 26.0–34.0)
MCHC: 30.3 g/dL (ref 30.0–36.0)
MCV: 93.6 fL (ref 80.0–100.0)
Monocytes Absolute: 0.5 10*3/uL (ref 0.1–1.0)
Monocytes Relative: 11 %
Neutro Abs: 3.1 10*3/uL (ref 1.7–7.7)
Neutrophils Relative %: 67 %
Platelets: 184 10*3/uL (ref 150–400)
RBC: 4.37 MIL/uL (ref 4.22–5.81)
RDW: 15.2 % (ref 11.5–15.5)
WBC: 4.7 10*3/uL (ref 4.0–10.5)
nRBC: 0 % (ref 0.0–0.2)

## 2022-08-08 LAB — COMPREHENSIVE METABOLIC PANEL
ALT: 13 U/L (ref 0–44)
AST: 25 U/L (ref 15–41)
Albumin: 3.7 g/dL (ref 3.5–5.0)
Alkaline Phosphatase: 68 U/L (ref 38–126)
Anion gap: 10 (ref 5–15)
BUN: 25 mg/dL — ABNORMAL HIGH (ref 8–23)
CO2: 35 mmol/L — ABNORMAL HIGH (ref 22–32)
Calcium: 8.8 mg/dL — ABNORMAL LOW (ref 8.9–10.3)
Chloride: 94 mmol/L — ABNORMAL LOW (ref 98–111)
Creatinine, Ser: 0.93 mg/dL (ref 0.61–1.24)
GFR, Estimated: >60 mL/min (ref >60)
Glucose, Bld: 115 mg/dL — ABNORMAL HIGH (ref 70–99)
Potassium: 4.7 mmol/L (ref 3.5–5.1)
Sodium: 139 mmol/L (ref 135–145)
Total Bilirubin: 1.4 mg/dL — ABNORMAL HIGH (ref 0.3–1.2)
Total Protein: 6.5 g/dL (ref 6.5–8.1)

## 2022-08-08 LAB — TROPONIN I (HIGH SENSITIVITY)
Troponin I (High Sensitivity): 38 ng/L — ABNORMAL HIGH (ref <18)
Troponin I (High Sensitivity): 42 ng/L — ABNORMAL HIGH (ref <18)

## 2022-08-08 MED ORDER — SODIUM CHLORIDE 0.9 % IV BOLUS
500.0000 mL | Freq: Once | INTRAVENOUS | Status: AC
  Administered 2022-08-08: 500 mL via INTRAVENOUS

## 2022-08-08 NOTE — ED Triage Notes (Signed)
Pt to ED from home via EMS CC of generalized weakness and hypotension in the field. Pt was discharged from hospital recently after CHF exacerbation. Pt denies dizzness and A&O x4. Vitals stable.

## 2022-08-08 NOTE — Discharge Instructions (Addendum)
Please seek medical attention for any high fevers, chest pain, shortness of breath, change in behavior, persistent vomiting, bloody stool or any other new or concerning symptoms.  

## 2022-08-08 NOTE — ED Provider Notes (Signed)
[facility] Provider Note    Event Date/Time   First MD Initiated Contact with Patient 08/08/22 1516     (approximate)   History   Weakness   HPI  [name] is a 88 y.o. male   who presents to the emergency department today because of concerns for episode of confusion and low blood pressure.  Patient was discharged from the hospital yesterday after admission for CHF.  Was feeling pretty good when he got home yesterday and again this morning.  He then took a nap this afternoon.  When family went to wake him up he was confused.  Stated he then had some shivers throughout his body.  They checked his blood pressure and noted it was low.  The time my exam the patient is feeling better.  They do have concerns that he might of been over diuresed.  Patient denies any headache, chest pain or palpitations at the time of his symptoms.      Physical Exam   Triage Vital Signs: ED Triage Vitals  Enc Vitals Group     BP 08/08/22 1518 124/73     Pulse Rate 08/08/22 1518 68     Resp 08/08/22 1518 20     Temp 08/08/22 1518 (!) 97.5 F (36.4 C)     Temp Source 08/08/22 1518 Oral     SpO2 08/08/22 1518 100 %     Weight 08/08/22 1520 180 lb (81.6 kg)     Height 08/08/22 1520 '5\' 10"'$  (1.778 m)     Head Circumference --      Peak Flow --      Pain Score 08/08/22 1520 0     Pain Loc --      Pain Edu? --      Excl. in [place]? --     Most recent vital signs: Vitals:   08/08/22 1518  BP: 124/73  Pulse: 68  Resp: 20  Temp: (!) 97.5 F (36.4 C)  SpO2: 100%   General: Awake, alert, oriented. CV:  Good peripheral perfusion. Regular rate and rhythm. Resp:  Normal effort. Lungs clear. Abd:  No distention.     ED Results / Procedures / Treatments   Labs (all labs ordered are listed, but only abnormal results are displayed) Labs Reviewed - No data to display   EKG  I, Nance Pear, attending physician, personally viewed and interpreted this EKG  EKG  Time: 1546 Rate: 63 Rhythm: sinus rhythm Axis: normal Intervals: qtc 438 QRS: nonspecific ivcd ST changes: no st elevation Impression: abnormal ekg  RADIOLOGY None   PROCEDURES:  Critical Care performed: No  Procedures   MEDICATIONS ORDERED IN ED: Medications - No data to display   IMPRESSION / MDM / [place] / ED COURSE  I reviewed the triage vital signs and the nursing notes.                              Differential diagnosis includes, but is not limited to, dehydration, electrolyte abnormality, infection, anemia.  Patient's presentation is most consistent with acute presentation with potential threat to life or bodily function.  Patient presented to the emergency department today because of concerns for episode of confusion, weakness and low blood pressure.  Here in the emergency department patients pressure is appropriate.  He did have a recent admission for heart failure and was diuresed.  Do have concerns for possible over diuresis.  No leukocytosis in the blood work to suggest infection.  Initial troponin was slightly elevated however repeat is essentially unchanged.  I think this is likely secondary to heart failure.  Patient did feel better after IV fluids.  Was able to ambulate without any difficulty. Will plan on discharging home to follow up with PCP.  FINAL CLINICAL IMPRESSION(S) / ED DIAGNOSES   Final diagnoses:  Hypotension, unspecified hypotension type  Dehydration      Note:  This document was prepared using Dragon voice recognition software and may include unintentional dictation errors.    Nance Pear, MD 08/08/22 [phone]

## 2022-08-12 ENCOUNTER — Encounter: Payer: Medicare Other | Admitting: Occupational Therapy

## 2022-08-12 NOTE — Telephone Encounter (Signed)
LM FOR PT TO CB - 3RD ATTEMPT

## 2022-08-13 ENCOUNTER — Encounter: Payer: Medicare Other | Admitting: Occupational Therapy

## 2022-08-13 DIAGNOSIS — I10 Essential (primary) hypertension: Secondary | ICD-10-CM | POA: Diagnosis not present

## 2022-08-13 DIAGNOSIS — I071 Rheumatic tricuspid insufficiency: Secondary | ICD-10-CM | POA: Diagnosis not present

## 2022-08-13 DIAGNOSIS — I5022 Chronic systolic (congestive) heart failure: Secondary | ICD-10-CM | POA: Diagnosis not present

## 2022-08-13 DIAGNOSIS — I340 Nonrheumatic mitral (valve) insufficiency: Secondary | ICD-10-CM | POA: Diagnosis not present

## 2022-08-14 ENCOUNTER — Encounter: Payer: Medicare Other | Admitting: Occupational Therapy

## 2022-08-19 ENCOUNTER — Telehealth: Payer: Self-pay | Admitting: Family

## 2022-08-19 ENCOUNTER — Encounter: Payer: Medicare Other | Admitting: Occupational Therapy

## 2022-08-19 ENCOUNTER — Ambulatory Visit: Payer: Medicare Other | Admitting: Family

## 2022-08-19 NOTE — Telephone Encounter (Signed)
Patient did not show for his [facility] Failure [facility] appointment on 08/19/22. Will attempt to reschedule.

## 2022-08-20 ENCOUNTER — Encounter: Payer: Medicare Other | Admitting: Occupational Therapy

## 2022-08-26 ENCOUNTER — Encounter: Payer: Medicare Other | Admitting: Occupational Therapy

## 2022-08-27 ENCOUNTER — Encounter: Payer: Medicare Other | Admitting: Occupational Therapy

## 2022-08-28 ENCOUNTER — Encounter: Payer: Medicare Other | Admitting: Occupational Therapy

## 2022-09-01 ENCOUNTER — Ambulatory Visit: Payer: Medicare Other | Admitting: Dermatology

## 2022-09-02 ENCOUNTER — Telehealth: Payer: Self-pay

## 2022-09-02 ENCOUNTER — Encounter: Payer: Medicare Other | Admitting: Occupational Therapy

## 2022-09-02 NOTE — Telephone Encounter (Signed)
Received fax requesting signature for Plan of Care but no Plan of Care was attached. I called and spoke with Melissa and she stated that she would refax request that includes Plan of Care.

## 2022-09-03 ENCOUNTER — Ambulatory Visit (INDEPENDENT_AMBULATORY_CARE_PROVIDER_SITE_OTHER): Payer: Medicare Other | Admitting: Podiatry

## 2022-09-03 ENCOUNTER — Encounter: Payer: Medicare Other | Admitting: Occupational Therapy

## 2022-09-03 DIAGNOSIS — M79675 Pain in left toe(s): Secondary | ICD-10-CM

## 2022-09-03 DIAGNOSIS — B351 Tinea unguium: Secondary | ICD-10-CM | POA: Diagnosis not present

## 2022-09-03 DIAGNOSIS — M79674 Pain in right toe(s): Secondary | ICD-10-CM

## 2022-09-03 NOTE — Progress Notes (Signed)
  Subjective:  Patient ID: [name], male    DOB: [birth year],  MRN: [id number]  Chief Complaint  Patient presents with   Nail Problem    EST - RFC NAIL TRIM - INGROWN NAIL    88 y.o. male presents with the above complaint. History confirmed with patient.  Nails are thickened and elongated again causing pain  Objective:  Physical Exam: warm, good capillary refill, no trophic changes or ulcerative lesions, normal DP and PT pulses, and normal sensory exam. Left Foot: dystrophic yellowed discolored nail plates with subungual debris Right Foot: dystrophic yellowed discolored nail plates with subungual debris   Assessment:   1. Pain due to onychomycosis of toenails of both feet      Plan:  Patient was evaluated and treated and all questions answered.  Discussed the etiology and treatment options for the condition in detail with the patient. Educated patient on the topical and oral treatment options for mycotic nails. Recommended debridement of the nails today. Sharp and mechanical debridement performed of all painful and mycotic nails today. Nails debrided in length and thickness using a nail nipper to level of comfort. Discussed treatment options including appropriate shoe gear. Follow up as needed for painful nails.    No follow-ups on file.

## 2022-09-04 ENCOUNTER — Encounter: Payer: Medicare Other | Admitting: Occupational Therapy

## 2022-09-05 ENCOUNTER — Emergency Department: Payer: Medicare Other

## 2022-09-05 DIAGNOSIS — N1831 Chronic kidney disease, stage 3a: Secondary | ICD-10-CM | POA: Diagnosis present

## 2022-09-05 DIAGNOSIS — G7080 Lambert-Eaton syndrome, unspecified: Secondary | ICD-10-CM | POA: Diagnosis present

## 2022-09-05 DIAGNOSIS — Z66 Do not resuscitate: Secondary | ICD-10-CM | POA: Diagnosis not present

## 2022-09-05 DIAGNOSIS — I719 Aortic aneurysm of unspecified site, without rupture: Secondary | ICD-10-CM | POA: Diagnosis present

## 2022-09-05 DIAGNOSIS — E876 Hypokalemia: Secondary | ICD-10-CM | POA: Diagnosis not present

## 2022-09-05 DIAGNOSIS — R0602 Shortness of breath: Secondary | ICD-10-CM | POA: Diagnosis not present

## 2022-09-05 DIAGNOSIS — E7800 Pure hypercholesterolemia, unspecified: Secondary | ICD-10-CM | POA: Diagnosis present

## 2022-09-05 DIAGNOSIS — Z8261 Family history of arthritis: Secondary | ICD-10-CM

## 2022-09-05 DIAGNOSIS — Z85828 Personal history of other malignant neoplasm of skin: Secondary | ICD-10-CM

## 2022-09-05 DIAGNOSIS — Z87891 Personal history of nicotine dependence: Secondary | ICD-10-CM

## 2022-09-05 DIAGNOSIS — Z7969 Long term (current) use of other immunomodulators and immunosuppressants: Secondary | ICD-10-CM

## 2022-09-05 DIAGNOSIS — N400 Enlarged prostate without lower urinary tract symptoms: Secondary | ICD-10-CM | POA: Diagnosis present

## 2022-09-05 DIAGNOSIS — Z9049 Acquired absence of other specified parts of digestive tract: Secondary | ICD-10-CM

## 2022-09-05 DIAGNOSIS — T501X5A Adverse effect of loop [high-ceiling] diuretics, initial encounter: Secondary | ICD-10-CM | POA: Diagnosis present

## 2022-09-05 DIAGNOSIS — I081 Rheumatic disorders of both mitral and tricuspid valves: Secondary | ICD-10-CM | POA: Diagnosis present

## 2022-09-05 DIAGNOSIS — Z7982 Long term (current) use of aspirin: Secondary | ICD-10-CM

## 2022-09-05 DIAGNOSIS — I130 Hypertensive heart and chronic kidney disease with heart failure and stage 1 through stage 4 chronic kidney disease, or unspecified chronic kidney disease: Secondary | ICD-10-CM | POA: Diagnosis not present

## 2022-09-05 DIAGNOSIS — N179 Acute kidney failure, unspecified: Secondary | ICD-10-CM | POA: Diagnosis present

## 2022-09-05 DIAGNOSIS — I509 Heart failure, unspecified: Secondary | ICD-10-CM | POA: Diagnosis not present

## 2022-09-05 DIAGNOSIS — Z1152 Encounter for screening for COVID-19: Secondary | ICD-10-CM

## 2022-09-05 DIAGNOSIS — Z8042 Family history of malignant neoplasm of prostate: Secondary | ICD-10-CM

## 2022-09-05 DIAGNOSIS — J9622 Acute and chronic respiratory failure with hypercapnia: Secondary | ICD-10-CM | POA: Diagnosis present

## 2022-09-05 DIAGNOSIS — Z7902 Long term (current) use of antithrombotics/antiplatelets: Secondary | ICD-10-CM

## 2022-09-05 DIAGNOSIS — J9621 Acute and chronic respiratory failure with hypoxia: Secondary | ICD-10-CM | POA: Diagnosis present

## 2022-09-05 DIAGNOSIS — G7000 Myasthenia gravis without (acute) exacerbation: Secondary | ICD-10-CM | POA: Diagnosis present

## 2022-09-05 DIAGNOSIS — Z9981 Dependence on supplemental oxygen: Secondary | ICD-10-CM

## 2022-09-05 DIAGNOSIS — R54 Age-related physical debility: Secondary | ICD-10-CM | POA: Diagnosis present

## 2022-09-05 DIAGNOSIS — Z888 Allergy status to other drugs, medicaments and biological substances status: Secondary | ICD-10-CM

## 2022-09-05 DIAGNOSIS — I5023 Acute on chronic systolic (congestive) heart failure: Secondary | ICD-10-CM | POA: Diagnosis present

## 2022-09-05 DIAGNOSIS — Z801 Family history of malignant neoplasm of trachea, bronchus and lung: Secondary | ICD-10-CM

## 2022-09-05 DIAGNOSIS — I252 Old myocardial infarction: Secondary | ICD-10-CM

## 2022-09-05 DIAGNOSIS — Z8551 Personal history of malignant neoplasm of bladder: Secondary | ICD-10-CM

## 2022-09-05 DIAGNOSIS — E611 Iron deficiency: Secondary | ICD-10-CM | POA: Diagnosis present

## 2022-09-05 DIAGNOSIS — Z79899 Other long term (current) drug therapy: Secondary | ICD-10-CM

## 2022-09-05 DIAGNOSIS — I2510 Atherosclerotic heart disease of native coronary artery without angina pectoris: Secondary | ICD-10-CM | POA: Diagnosis present

## 2022-09-05 DIAGNOSIS — J90 Pleural effusion, not elsewhere classified: Secondary | ICD-10-CM | POA: Diagnosis not present

## 2022-09-05 DIAGNOSIS — I2489 Other forms of acute ischemic heart disease: Secondary | ICD-10-CM | POA: Diagnosis present

## 2022-09-05 DIAGNOSIS — Z882 Allergy status to sulfonamides status: Secondary | ICD-10-CM

## 2022-09-05 DIAGNOSIS — I110 Hypertensive heart disease with heart failure: Secondary | ICD-10-CM | POA: Diagnosis not present

## 2022-09-05 DIAGNOSIS — J811 Chronic pulmonary edema: Secondary | ICD-10-CM | POA: Diagnosis not present

## 2022-09-05 DIAGNOSIS — Z881 Allergy status to other antibiotic agents status: Secondary | ICD-10-CM

## 2022-09-05 DIAGNOSIS — I6523 Occlusion and stenosis of bilateral carotid arteries: Secondary | ICD-10-CM | POA: Diagnosis present

## 2022-09-05 LAB — CBC
HCT: 37.5 % — ABNORMAL LOW (ref 39.0–52.0)
Hemoglobin: 11.2 g/dL — ABNORMAL LOW (ref 13.0–17.0)
MCH: 28.7 pg (ref 26.0–34.0)
MCHC: 29.9 g/dL — ABNORMAL LOW (ref 30.0–36.0)
MCV: 96.2 fL (ref 80.0–100.0)
Platelets: 243 10*3/uL (ref 150–400)
RBC: 3.9 MIL/uL — ABNORMAL LOW (ref 4.22–5.81)
RDW: 15.8 % — ABNORMAL HIGH (ref 11.5–15.5)
WBC: 10.1 10*3/uL (ref 4.0–10.5)
nRBC: 0 % (ref 0.0–0.2)

## 2022-09-05 LAB — RESP PANEL BY RT-PCR (FLU A&B, COVID) ARPGX2
Influenza A by PCR: NEGATIVE
Influenza B by PCR: NEGATIVE
SARS Coronavirus 2 by RT PCR: NEGATIVE

## 2022-09-05 LAB — BASIC METABOLIC PANEL
Anion gap: 11 (ref 5–15)
BUN: 57 mg/dL — ABNORMAL HIGH (ref 8–23)
CO2: 32 mmol/L (ref 22–32)
Calcium: 9 mg/dL (ref 8.9–10.3)
Chloride: 98 mmol/L (ref 98–111)
Creatinine, Ser: 1.49 mg/dL — ABNORMAL HIGH (ref 0.61–1.24)
GFR, Estimated: 43 mL/min — ABNORMAL LOW (ref >60)
Glucose, Bld: 126 mg/dL — ABNORMAL HIGH (ref 70–99)
Potassium: 3.9 mmol/L (ref 3.5–5.1)
Sodium: 141 mmol/L (ref 135–145)

## 2022-09-05 LAB — TROPONIN I (HIGH SENSITIVITY): Troponin I (High Sensitivity): 121 ng/L (ref <18)

## 2022-09-05 LAB — BRAIN NATRIURETIC PEPTIDE: B Natriuretic Peptide: 2080.3 pg/mL — ABNORMAL HIGH (ref 0.0–100.0)

## 2022-09-05 NOTE — ED Triage Notes (Signed)
Pt presents via EMS from home with complaints of SOB that started tonight. Hx of CHF - wears 3.5L [place] at baseline. Per EMS, the patient had labored breathing upon FF arrival and they placed the patient on a NRB @ 15L. Pts respirations are equal on 4L [place] while in triage. O2 saturation 100% - pt endorses comfortably on the [place]. Complaint with his medications. Denies CP    20G RFA 134/82 BP 67 HR 100%- NRB

## 2022-09-06 ENCOUNTER — Inpatient Hospital Stay
Admission: EM | Admit: 2022-09-06 | Discharge: 2022-09-12 | DRG: 291 | Disposition: A | Discharge status: Hospice | Payer: Medicare Other | Attending: Internal Medicine | Admitting: Internal Medicine

## 2022-09-06 DIAGNOSIS — N179 Acute kidney failure, unspecified: Secondary | ICD-10-CM | POA: Insufficient documentation

## 2022-09-06 DIAGNOSIS — I10 Essential (primary) hypertension: Secondary | ICD-10-CM | POA: Diagnosis present

## 2022-09-06 DIAGNOSIS — J90 Pleural effusion, not elsewhere classified: Secondary | ICD-10-CM

## 2022-09-06 DIAGNOSIS — Z1152 Encounter for screening for COVID-19: Secondary | ICD-10-CM | POA: Diagnosis not present

## 2022-09-06 DIAGNOSIS — J9690 Respiratory failure, unspecified, unspecified whether with hypoxia or hypercapnia: Secondary | ICD-10-CM | POA: Diagnosis not present

## 2022-09-06 DIAGNOSIS — I719 Aortic aneurysm of unspecified site, without rupture: Secondary | ICD-10-CM | POA: Diagnosis present

## 2022-09-06 DIAGNOSIS — I2510 Atherosclerotic heart disease of native coronary artery without angina pectoris: Secondary | ICD-10-CM | POA: Diagnosis present

## 2022-09-06 DIAGNOSIS — Z87891 Personal history of nicotine dependence: Secondary | ICD-10-CM | POA: Diagnosis not present

## 2022-09-06 DIAGNOSIS — J9621 Acute and chronic respiratory failure with hypoxia: Secondary | ICD-10-CM | POA: Diagnosis present

## 2022-09-06 DIAGNOSIS — Z66 Do not resuscitate: Secondary | ICD-10-CM | POA: Diagnosis not present

## 2022-09-06 DIAGNOSIS — E7800 Pure hypercholesterolemia, unspecified: Secondary | ICD-10-CM | POA: Diagnosis present

## 2022-09-06 DIAGNOSIS — N400 Enlarged prostate without lower urinary tract symptoms: Secondary | ICD-10-CM | POA: Diagnosis present

## 2022-09-06 DIAGNOSIS — I5023 Acute on chronic systolic (congestive) heart failure: Secondary | ICD-10-CM | POA: Diagnosis not present

## 2022-09-06 DIAGNOSIS — G7080 Lambert-Eaton syndrome, unspecified: Secondary | ICD-10-CM | POA: Diagnosis present

## 2022-09-06 DIAGNOSIS — G7000 Myasthenia gravis without (acute) exacerbation: Secondary | ICD-10-CM | POA: Diagnosis present

## 2022-09-06 DIAGNOSIS — Z8551 Personal history of malignant neoplasm of bladder: Secondary | ICD-10-CM

## 2022-09-06 DIAGNOSIS — R0602 Shortness of breath: Secondary | ICD-10-CM | POA: Diagnosis present

## 2022-09-06 DIAGNOSIS — E611 Iron deficiency: Secondary | ICD-10-CM | POA: Diagnosis present

## 2022-09-06 DIAGNOSIS — I509 Heart failure, unspecified: Principal | ICD-10-CM | POA: Insufficient documentation

## 2022-09-06 DIAGNOSIS — R7989 Other specified abnormal findings of blood chemistry: Secondary | ICD-10-CM

## 2022-09-06 DIAGNOSIS — I130 Hypertensive heart and chronic kidney disease with heart failure and stage 1 through stage 4 chronic kidney disease, or unspecified chronic kidney disease: Secondary | ICD-10-CM | POA: Diagnosis present

## 2022-09-06 DIAGNOSIS — I252 Old myocardial infarction: Secondary | ICD-10-CM | POA: Diagnosis not present

## 2022-09-06 DIAGNOSIS — E876 Hypokalemia: Secondary | ICD-10-CM | POA: Diagnosis not present

## 2022-09-06 DIAGNOSIS — Z79899 Other long term (current) drug therapy: Secondary | ICD-10-CM | POA: Diagnosis not present

## 2022-09-06 DIAGNOSIS — I6523 Occlusion and stenosis of bilateral carotid arteries: Secondary | ICD-10-CM | POA: Diagnosis present

## 2022-09-06 DIAGNOSIS — I081 Rheumatic disorders of both mitral and tricuspid valves: Secondary | ICD-10-CM | POA: Diagnosis present

## 2022-09-06 DIAGNOSIS — J9622 Acute and chronic respiratory failure with hypercapnia: Secondary | ICD-10-CM | POA: Diagnosis present

## 2022-09-06 DIAGNOSIS — I2489 Other forms of acute ischemic heart disease: Secondary | ICD-10-CM | POA: Diagnosis present

## 2022-09-06 DIAGNOSIS — Z7189 Other specified counseling: Secondary | ICD-10-CM | POA: Diagnosis not present

## 2022-09-06 DIAGNOSIS — N1831 Chronic kidney disease, stage 3a: Secondary | ICD-10-CM | POA: Diagnosis present

## 2022-09-06 DIAGNOSIS — Z515 Encounter for palliative care: Secondary | ICD-10-CM | POA: Diagnosis not present

## 2022-09-06 DIAGNOSIS — T501X5A Adverse effect of loop [high-ceiling] diuretics, initial encounter: Secondary | ICD-10-CM | POA: Diagnosis present

## 2022-09-06 LAB — BASIC METABOLIC PANEL
Anion gap: 7 (ref 5–15)
Anion gap: 7 (ref 5–15)
BUN: 52 mg/dL — ABNORMAL HIGH (ref 8–23)
BUN: 50 mg/dL — ABNORMAL HIGH (ref 8–23)
CO2: 34 mmol/L — ABNORMAL HIGH (ref 22–32)
CO2: 34 mmol/L — ABNORMAL HIGH (ref 22–32)
Calcium: 8.9 mg/dL (ref 8.9–10.3)
Calcium: 8.8 mg/dL — ABNORMAL LOW (ref 8.9–10.3)
Chloride: 99 mmol/L (ref 98–111)
Chloride: 99 mmol/L (ref 98–111)
Creatinine, Ser: 1.39 mg/dL — ABNORMAL HIGH (ref 0.61–1.24)
Creatinine, Ser: 1.4 mg/dL — ABNORMAL HIGH (ref 0.61–1.24)
GFR, Estimated: 47 mL/min — ABNORMAL LOW (ref >60)
GFR, Estimated: 47 mL/min — ABNORMAL LOW (ref >60)
Glucose, Bld: 136 mg/dL — ABNORMAL HIGH (ref 70–99)
Glucose, Bld: 134 mg/dL — ABNORMAL HIGH (ref 70–99)
Potassium: 4.3 mmol/L (ref 3.5–5.1)
Potassium: 4.2 mmol/L (ref 3.5–5.1)
Sodium: 140 mmol/L (ref 135–145)
Sodium: 140 mmol/L (ref 135–145)

## 2022-09-06 LAB — TROPONIN I (HIGH SENSITIVITY): Troponin I (High Sensitivity): 130 ng/L (ref <18)

## 2022-09-06 LAB — MAGNESIUM: Magnesium: 2.3 mg/dL (ref 1.7–2.4)

## 2022-09-06 LAB — CBC
HCT: 38 % — ABNORMAL LOW (ref 39.0–52.0)
Hemoglobin: 11.1 g/dL — ABNORMAL LOW (ref 13.0–17.0)
MCH: 28.5 pg (ref 26.0–34.0)
MCHC: 29.2 g/dL — ABNORMAL LOW (ref 30.0–36.0)
MCV: 97.4 fL (ref 80.0–100.0)
Platelets: 225 10*3/uL (ref 150–400)
RBC: 3.9 MIL/uL — ABNORMAL LOW (ref 4.22–5.81)
RDW: 15.9 % — ABNORMAL HIGH (ref 11.5–15.5)
WBC: 10.2 10*3/uL (ref 4.0–10.5)
nRBC: 0 % (ref 0.0–0.2)

## 2022-09-06 LAB — LACTIC ACID, PLASMA: Lactic Acid, Venous: 2.3 mmol/L (ref 0.5–1.9)

## 2022-09-06 LAB — PHOSPHORUS: Phosphorus: 3.6 mg/dL (ref 2.5–4.6)

## 2022-09-06 MED ORDER — MYCOPHENOLATE MOFETIL 250 MG PO CAPS
1000.0000 mg | ORAL_CAPSULE | Freq: Two times a day (BID) | ORAL | Status: DC
  Administered 2022-09-06 – 2022-09-12 (×11): 1000 mg via ORAL
  Filled 2022-09-06 (×14): qty 4

## 2022-09-06 MED ORDER — PYRIDOSTIGMINE BROMIDE 60 MG PO TABS
30.0000 mg | ORAL_TABLET | Freq: Every day | ORAL | Status: DC
  Administered 2022-09-06 – 2022-09-12 (×35): 30 mg via ORAL
  Filled 2022-09-06 (×43): qty 0.5

## 2022-09-06 MED ORDER — IPRATROPIUM-ALBUTEROL 0.5-2.5 (3) MG/3ML IN SOLN
3.0000 mL | Freq: Four times a day (QID) | RESPIRATORY_TRACT | Status: DC | PRN
  Administered 2022-09-09 – 2022-09-10 (×2): 3 mL via RESPIRATORY_TRACT
  Filled 2022-09-06 (×3): qty 3

## 2022-09-06 MED ORDER — CLOPIDOGREL BISULFATE 75 MG PO TABS
75.0000 mg | ORAL_TABLET | Freq: Every day | ORAL | Status: DC
  Administered 2022-09-06 – 2022-09-08 (×3): 75 mg via ORAL
  Filled 2022-09-06 (×3): qty 1

## 2022-09-06 MED ORDER — ENSURE ENLIVE PO LIQD
237.0000 mL | Freq: Three times a day (TID) | ORAL | Status: DC
  Administered 2022-09-06 – 2022-09-12 (×11): 237 mL via ORAL

## 2022-09-06 MED ORDER — FUROSEMIDE 10 MG/ML IJ SOLN
80.0000 mg | Freq: Once | INTRAMUSCULAR | Status: AC
  Administered 2022-09-06: 80 mg via INTRAVENOUS
  Filled 2022-09-06: qty 8

## 2022-09-06 MED ORDER — PRAVASTATIN SODIUM 20 MG PO TABS
20.0000 mg | ORAL_TABLET | Freq: Every day | ORAL | Status: DC
  Administered 2022-09-06 – 2022-09-08 (×3): 20 mg via ORAL
  Filled 2022-09-06 (×3): qty 1

## 2022-09-06 MED ORDER — ACETAMINOPHEN 650 MG RE SUPP: 650.0000 mg | Freq: Four times a day (QID) | RECTAL | Status: DC | PRN

## 2022-09-06 MED ORDER — MYCOPHENOLATE MOFETIL 250 MG PO CAPS
1000.0000 mg | ORAL_CAPSULE | Freq: Two times a day (BID) | ORAL | Status: DC
  Administered 2022-09-06: 1000 mg via ORAL
  Filled 2022-09-06 (×2): qty 4

## 2022-09-06 MED ORDER — ENOXAPARIN SODIUM 40 MG/0.4ML IJ SOSY
40.0000 mg | PREFILLED_SYRINGE | INTRAMUSCULAR | Status: DC
  Administered 2022-09-06 – 2022-09-11 (×6): 40 mg via SUBCUTANEOUS
  Filled 2022-09-06 (×7): qty 0.4

## 2022-09-06 MED ORDER — FUROSEMIDE 10 MG/ML IJ SOLN
40.0000 mg | Freq: Two times a day (BID) | INTRAMUSCULAR | Status: DC
  Administered 2022-09-06 – 2022-09-07 (×4): 40 mg via INTRAVENOUS
  Filled 2022-09-06 (×5): qty 4

## 2022-09-06 MED ORDER — MIDODRINE HCL 5 MG PO TABS
10.0000 mg | ORAL_TABLET | Freq: Once | ORAL | Status: AC
  Administered 2022-09-06: 10 mg via ORAL
  Filled 2022-09-06: qty 2

## 2022-09-06 MED ORDER — ONDANSETRON HCL 4 MG PO TABS: 4.0000 mg | ORAL_TABLET | Freq: Four times a day (QID) | ORAL | Status: DC | PRN

## 2022-09-06 MED ORDER — ASPIRIN 81 MG PO TBEC
81.0000 mg | DELAYED_RELEASE_TABLET | Freq: Every day | ORAL | Status: DC
  Administered 2022-09-06 – 2022-09-12 (×7): 81 mg via ORAL
  Filled 2022-09-06 (×7): qty 1

## 2022-09-06 MED ORDER — ACETAMINOPHEN 325 MG PO TABS
650.0000 mg | ORAL_TABLET | Freq: Four times a day (QID) | ORAL | Status: DC | PRN
  Administered 2022-09-06 – 2022-09-09 (×2): 650 mg via ORAL
  Filled 2022-09-06 (×2): qty 2

## 2022-09-06 MED ORDER — TAMSULOSIN HCL 0.4 MG PO CAPS
0.4000 mg | ORAL_CAPSULE | Freq: Every day | ORAL | Status: DC
  Administered 2022-09-06 – 2022-09-12 (×7): 0.4 mg via ORAL
  Filled 2022-09-06 (×7): qty 1

## 2022-09-06 MED ORDER — ONDANSETRON HCL 4 MG/2ML IJ SOLN: 4.0000 mg | Freq: Four times a day (QID) | INTRAMUSCULAR | Status: DC | PRN

## 2022-09-06 MED ORDER — MIDODRINE HCL 5 MG PO TABS
5.0000 mg | ORAL_TABLET | Freq: Three times a day (TID) | ORAL | Status: DC
  Administered 2022-09-06 – 2022-09-12 (×16): 5 mg via ORAL
  Filled 2022-09-06 (×16): qty 1

## 2022-09-06 MED ORDER — FINASTERIDE 5 MG PO TABS
5.0000 mg | ORAL_TABLET | Freq: Every day | ORAL | Status: DC
  Administered 2022-09-06 – 2022-09-12 (×7): 5 mg via ORAL
  Filled 2022-09-06 (×7): qty 1

## 2022-09-06 NOTE — ED Notes (Addendum)
MD notified of pt hypotension. Pt asymptomatic. Only complaint is cramping.

## 2022-09-06 NOTE — Assessment & Plan Note (Signed)
Creatinine 1.49 up from baseline of 0.93, likely related to Lasix Continue to monitor given ongoing need for IV Lasix Avoid nephrotoxins

## 2022-09-06 NOTE — ED Notes (Signed)
Dr. Dwyane Dee aware of Lactic level. No new orders at this time.

## 2022-09-06 NOTE — Consult Note (Signed)
[facility] Consultation Note  Patient ID: [name], MRN: [id number], DOB/AGE: 10/04/1925 88 y.o. Admit date: 09/06/2022   Date of Consult: 09/06/2022 Primary Physician: [name], MD Primary Cardiologist: [place]  Chief Complaint:  Chief Complaint  Patient presents with   Shortness of Breath   Reason for Consult:Heart failure  HPI: 88 y.o. male with known chronic systolic dysfunction congestive heart failure with ejection fraction of 25% and previously occluded left anterior descending artery chronic kidney disease stage III with acute progression lower extremity edema shortness of breath weakness and fatigue and hypoxia.  He was seen in the emergency room at which time he had an EKG showing normal sinus rhythm left axis deviation anterior infarct age undetermined chest x-ray showed pulmonary vascular congestion with pleural effusions troponin was 130 General glomerular filtration rate was 47.  The patient does have significant exam findings from chest of heart failure and has had slight improvement in symptoms with intravenous Lasix.  There is no evidence of chest pain or acute coronary syndrome  Past Medical History:  Diagnosis Date   Abnormal chest CT 03/28/2017   Allergy    Anemia    Angina pectoris ([place]) 04/02/2011   Aortic aneurysm ([place]) 03/28/2017   Saw Dr Genevive Bi 04/16/17 - recommended f/u CT in 3 months.     Atherosclerosis of both carotid arteries 07/17/2014   Basal cell carcinoma 06/25/2021   Left ant neck. Nodulocystic pattern, close to margin   Benign lipomatous neoplasm of skin and subcutaneous tissue of left arm 10/04/2013   Bilateral carotid artery stenosis 08/30/2014   Overview:  Less than 50% 2015   Bladder cancer ([place]) 03/05/2013   CAD (coronary artery disease) 06/18/2014   Cancer ([place]) 11/24/2012   bladder. BCG treatments by Dr Jacqlyn Larsen.   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 WD SCC. Re-shaved 01/14/2016. Excised 02/05/2016, margins free.   Squamous cell carcinoma of skin 11/03/2018   Right cheek ant. to sideburn. Poorly differentiated.   Squamous cell carcinoma of skin 02/02/2019   Right mid dorsum forearm. WD SCC. EDC.      Surgical History:  Past Surgical History:  Procedure Laterality Date   [place] Meds: Prior to Admission medications   Medication Sig Start Date End Date Taking? Authorizing Provider  Acetaminophen (ARTHRITIS PAIN PO) Take 650 mg by mouth.    [provider]  aspirin 81 MG tablet Take 81 mg by mouth daily.    [provider]  clopidogrel (PLAVIX) 75 MG tablet Take 1 tablet (75 mg total) by mouth daily. Total at least 6 mos  therapy w/ clopidogrel + aspirin 06/19/22   Emeterio Reeve, DO  docusate sodium (COLACE) 100 MG capsule Take 1 capsule (100 mg total) by mouth 2 (two) times daily as needed for mild constipation. 06/18/22   Emeterio Reeve, DO  finasteride (PROSCAR) 5 MG tablet Take 5 mg by mouth daily.    [provider]  furosemide (LASIX) 40 MG tablet Take 1 tablet (40 mg total) by mouth daily. Patient taking  differently: Take 40 mg by mouth daily. 04/03/20 08/04/22  Alisa Graff, FNP  hydroxypropyl methylcellulose / hypromellose (ISOPTO TEARS / GONIOVISC) 2.5 % ophthalmic solution Place 1 drop into both eyes daily.    [provider]  mycophenolate (CELLCEPT) 250 MG capsule Take 1,000 mg by mouth 2 (two) times daily.     [provider]  polyethylene glycol (MIRALAX / GLYCOLAX) 17 g packet Take 17 g by mouth daily as needed for moderate constipation. 06/18/22   Emeterio Reeve, DO  pravastatin (PRAVACHOL) 20 MG tablet Take 1 tablet (20 mg total) by mouth daily. 07/27/14   [name], MD  pyridostigmine (MESTINON) 60 MG tablet Take 30 mg by mouth 6 (six) times daily.     [provider]  tacrolimus (PROTOPIC) 0.1 % ointment Apply topically 2 (two) times daily. 07/29/21   Ralene Bathe, MD  tamsulosin (FLOMAX) 0.4 MG CAPS Take 0.4 mg by mouth daily.    [provider]    Inpatient Medications:   aspirin EC  81 mg Oral Daily   clopidogrel  75 mg Oral Daily   enoxaparin (LOVENOX) injection  40 mg Subcutaneous Q24H   finasteride  5 mg Oral Daily   furosemide  40 mg Intravenous BID   mycophenolate  1,000 mg Oral BID   pravastatin  20 mg Oral Daily   pyridostigmine  30 mg Oral 6 X Daily   tamsulosin  0.4 mg Oral Daily     Allergies:  Allergies  Allergen Reactions   Beta Adrenergic Blockers Other (See Comments)    Beta Blocker will precipitate acute weakness in Lambert-Eaton Syndrome or Myasthenia Gravis   Calcium Channel Blockers Other (See Comments)    Calcium Channel Blocker- will precipitate acute weakness in Lambert-Eaton Syndrome.   Diltiazem Other (See Comments)   Ciprofloxacin Other (See Comments)    Last resort due to Myasthia Gravis   Sulfa Antibiotics Rash    Social History   Socioeconomic History   Marital status: Widowed    Spouse name: Not on file   Number of children: Not on file   Years of education: Not on file   Highest  education level: Not on file  Occupational History   Not on file  Tobacco Use   Smoking status: Former    Types: Cigarettes    Quit date: 09/29/1958    Years since quitting: 63.9   Smokeless tobacco: Never  Vaping Use   Vaping Use: Never used  Substance and Sexual Activity   Alcohol use: Yes    Alcohol/week: 0.0 standard drinks of alcohol    Comment: occasionally   Drug use: No   Sexual activity: Never  Other Topics Concern   Not on file  Social History Narrative   Pt is married with 2 children - 1 son, 1 daughter. Previously self-employed in [employer]   Social Determinants of Health   Financial Resource Strain: [place]  (11/19/2021)   Overall Financial Resource Strain (CARDIA)    Difficulty of Paying Living Expenses: Not hard  at all  Food Insecurity: No Food Insecurity (08/05/2022)   Hunger Vital Sign    Worried About Running Out of Food in the Last Year: Never true    Ran Out of Food in the Last Year: Never true  Transportation Needs: No Transportation Needs (08/05/2022)   PRAPARE - Hydrologist (Medical): No    Lack of Transportation (Non-Medical): No  Physical Activity: Not on file  Stress: No Stress Concern Present (11/19/2021)   [place]    Feeling of Stress : Not at all  Social Connections: Unknown (11/19/2021)   Social Connection and Isolation Panel [NHANES]    Frequency of Communication with Friends and Family: More than three times a week    Frequency of Social Gatherings with Friends and Family: More than three times a week    Attends Religious Services: More than 4 times per year    Active Member of [employer] or Organizations: Not on file    Attends Archivist Meetings: Not on file    Marital Status: Widowed  Intimate Partner Violence: Not At Risk (08/05/2022)   Humiliation, Afraid, Rape, and Kick questionnaire    Fear of Current or Ex-Partner: No     Emotionally Abused: No    Physically Abused: No    Sexually Abused: No     Family History  Problem Relation Age of Onset   Lung cancer Sister    Prostate cancer Brother    Lung cancer Brother    Arthritis Other        parent   Colon cancer Neg Hx      Review of Systems Positive for shortness of breath Negative for: General:  chills, fever, night sweats or weight changes.  Cardiovascular: Positive for PND orthopnea s negative for yncope dizziness  Dermatological skin lesions rashes Respiratory: Cough congestion Urologic: Frequent urination urination at night and hematuria Abdominal: negative for nausea, vomiting, diarrhea, bright red blood per rectum, melena, or hematemesis Neurologic: negative for visual changes, and/or hearing changes  All other systems reviewed and are otherwise negative except as noted above.  Labs: No results for input(s): "CKTOTAL", "CKMB", "TROPONINI" in the last 72 hours. Lab Results  Component Value Date   WBC 10.2 09/06/2022   HGB 11.1 (L) 09/06/2022   HCT 38.0 (L) 09/06/2022   MCV 97.4 09/06/2022   PLT 225 09/06/2022    Recent Labs  Lab 09/06/22 0433  NA 140  K 4.3  CL 99  CO2 34*  BUN 52*  CREATININE 1.39*  CALCIUM 8.9  GLUCOSE 136*   Lab Results  Component Value Date   CHOL 134 06/16/2022   HDL 49 06/16/2022   LDLCALC 72 06/16/2022   TRIG 65 06/16/2022   Lab Results  Component Value Date   DDIMER 1.00 (H) 06/15/2022    Radiology/Studies:  DG Chest 2 View  Result Date: 09/05/2022 CLINICAL DATA:  Shortness of breath EXAM: CHEST - 2 VIEW COMPARISON:  09/29/2021, 08/04/2022 FINDINGS: Small moderate bilateral effusions. Cardiomegaly with vascular congestion and pulmonary edema. Basilar airspace disease. Aortic atherosclerosis. IMPRESSION: Cardiomegaly with vascular congestion, pulmonary edema and small to moderate bilateral effusions. Basilar airspace disease may be due to atelectasis or pneumonia. Electronically Signed   By: Donavan Foil M.D.   On: 09/05/2022 23:16    EKG: Normal sinus rhythm left axis deviation anterior infarct age undetermined  Weights: Filed Weights   09/05/22 2255  Weight: 81.7 kg  Physical Exam: Blood pressure (!) 145/114, pulse 65, temperature 97.9 F (36.6 C), temperature source Oral, resp. rate (!) 25, height '5\' 10"'$  (1.778 m), weight 81.7 kg, SpO2 97 %. Body mass index is 25.83 kg/m. General: Well developed, well nourished, in no acute distress. Head eyes ears nose throat: Normocephalic, atraumatic, sclera non-icteric, no xanthomas, nares are without discharge. No apparent thyromegaly and/or mass  Lungs: Normal respiratory effort.  no wheezes, betazole rales, no rhonchi.  Heart: RRR with normal S1 S2. no murmur gallop, no rub, PMI is normal size and placement, carotid upstroke normal without bruit, jugular venous pressure is normal Abdomen: Soft, non-tender, non-distended with normoactive bowel sounds. No hepatomegaly. No rebound/guarding. No obvious abdominal masses. Abdominal aorta is normal size without bruit Extremities: 1+ edema. no cyanosis, no clubbing, no ulcers  Peripheral : 2+ bilateral upper extremity pulses, 2+ bilateral femoral pulses, 2+ bilateral dorsal pedal pulse Neuro: Alert and oriented. No facial asymmetry. No focal deficit. Moves all extremities spontaneously. Musculoskeletal: Normal muscle tone without kyphosis Psych:  Responds to questions appropriately with a normal affect.    Assessment: 88 year old male with acute on chronic systolic dysfunction congestive heart failure chronic kidney disease stage III without evidence of acute coronary syndrome needing further treatment options  Plan: 1.  Intravenous Lasix for acute on chronic systolic dysfunction congestive heart failure 2.  No further cardiac diagnostics necessary at this time due to no evidence of acute coronary syndrome 3.  Potential addition of beta-blocker and other medication management for  guideline therapy chronic systolic dysfunction congestive heart failure if patient's blood pressure will tolerate 4.  Continue heart failure clinic and rehabilitation management  Signed, Corey Skains M.D. [facility] 09/06/2022, 8:05 AM

## 2022-09-06 NOTE — Assessment & Plan Note (Addendum)
Bilateral pleural effusion Acute on chronic respiratory failure with hypoxia Patient had increased work of breathing requiring 15 L NRB with EMS, now down to 4 L.  On 3.5 L at baseline BNP over 2000, CXR showing "cardiomegaly with vascular congestion, pulmonary edema and small to moderate bilateral effusions. Basilar airspace disease may be due to atelectasis or pneumonia". IV Lasix.  Not currently on beta-blocker, possibly secondary to his myasthenia.  Not on ARB Declines IR consult for consideration of thoracentesis Daily weights with intake and output monitoring EF in September 2023 showed EF 25 to 30%

## 2022-09-06 NOTE — ED Notes (Signed)
Assisted the patient's daughter in cleaning him up after he spilled coffee on himself. Provided blanket with a clean pad, clean blankets, and a clean gown. It was noted that the suction canister was full of urine (which the patient's daughter stated had happened shortly before my arrival in the room) and documented the output before emptying the canister of its' contents.

## 2022-09-06 NOTE — Assessment & Plan Note (Signed)
BPH Continue finasteride and tamsulosin

## 2022-09-06 NOTE — Assessment & Plan Note (Addendum)
Continue pyridostigmine and CellCept

## 2022-09-06 NOTE — Plan of Care (Signed)
Patient was seen and examined at bedside, patient was admitted overnight due to acute on chronic systolic CHF exacerbation, acute on chronic hyper respiratory failure and bilateral pleural effusion. Labs and imaging reviewed. Patient was seen by cardiology as well, recommended to continue diuresis. Will continue current treatment and follow along.

## 2022-09-06 NOTE — ED Notes (Signed)
MD notified regarding pt downtrending BP despite Midodrine administration. Pt obviously in discomfort due to "cramping hip". Family remains at the bedside.

## 2022-09-06 NOTE — Assessment & Plan Note (Signed)
Elevated troponin, with history of NSTEMI 05/2022 Troponin 121-130, almost flat, suspect secondary to demand ischemia.  Patient denies chest pain and EKG nonacute  Troponin peaked at above 8000 for NSTEMI September 23 Continue aspirin and Plavix Can consider repeat echo to evaluate for wall motion abnormality

## 2022-09-06 NOTE — ED Notes (Signed)
Pt in recliner, daughter at bedside. Nasal cannula had come off and SPO2 dropped to 78%. SPO2 now 96%.

## 2022-09-06 NOTE — H&P (Signed)
History and Physical    Patient: [name] DOB: [birth year] DOA: 09/06/2022 DOS: the patient was seen and examined on 09/06/2022 PCP: [name], MD  Patient coming from: Home  Chief Complaint:  Chief Complaint  Patient presents with   Shortness of Breath    HPI: [name] is a 88 y.o. male with medical history significant for CAD, HTN, BPH, Lambert-Eaton myasthenic syndrome, systolic CHF (EF 25 to 95% 05/2022) on home O2 at 2L, admitted twice in the past 3 months for CHF exacerbation and respiratory failure requiring BiPAP, most recently from 11/6 to 11/9 who presents to the ED with shortness of breath, orthopnea and lower extremity edema similar to recent prior exacerbations. In the past week he had to go up on hte oxygen to 3.5 from 2L. On his admission in September he was also diagnosed with NSTEMI.  At the present time patient denies chest pain, cough, fever or chills.  Denies leg pain.  On arrival of EMS patient had increased work of breathing and was initially placed on NRB at 15 L ED course and data review: BP 107/74 with pulse 62.  Afebrile with O2 sat of 100% on 4 L.  Troponin 121-130 with BNP 2080.Marland Kitchen  CBC significant for hemoglobin 11.2, down from 12.4.Creatinine 1.49 up from 0.93 about 4 weeks prior EKG, personally viewed and interpreted showing NSR at 68 with LVH and nonischemic ST-T wave changes chest x-ray showed CHF and possible pneumonia as follows: IMPRESSION: Cardiomegaly with vascular congestion, pulmonary edema and small to moderate bilateral effusions. Basilar airspace disease may be due to atelectasis or pneumonia.  Patient treated with IV Lasix and hospitalist consulted for admission.   Review of Systems: As mentioned in the history of present illness. All other systems reviewed and are negative.  Past Medical History:  Diagnosis Date   Abnormal chest CT 03/28/2017   Allergy    Anemia    Angina pectoris ([place]) 04/02/2011   Aortic  aneurysm ([place]) 03/28/2017   Saw Dr Genevive Bi 04/16/17 - recommended f/u CT in 3 months.     Atherosclerosis of both carotid arteries 07/17/2014   Basal cell carcinoma 06/25/2021   Left ant neck. Nodulocystic pattern, close to margin   Benign lipomatous neoplasm of skin and subcutaneous tissue of left arm 10/04/2013   Bilateral carotid artery stenosis 08/30/2014   Overview:  Less than 50% 2015   Bladder cancer ([place]) 03/05/2013   CAD (coronary artery disease) 06/18/2014   Cancer ([place]) 11/24/2012   bladder. BCG treatments by Dr Jacqlyn Larsen.   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 WD SCC. Re-shaved 01/14/2016. Excised 02/05/2016, margins free.   Squamous cell carcinoma of skin 11/03/2018   Right cheek ant. to  sideburn. Poorly differentiated.   Squamous cell carcinoma of skin 02/02/2019   Right mid dorsum forearm. WD SCC. EDC.   Past Surgical History:  Procedure Laterality Date   [place]   Social History:  reports that he quit smoking about 63 years ago. His smoking use included cigarettes. He has never used smokeless tobacco. He reports current alcohol use. He reports  that he does not use drugs.  Allergies  Allergen Reactions   Beta Adrenergic Blockers Other (See Comments)    Beta Blocker will precipitate acute weakness in Lambert-Eaton Syndrome or Myasthenia Gravis   Calcium Channel Blockers Other (See Comments)    Calcium Channel Blocker- will precipitate acute weakness in Lambert-Eaton Syndrome.   Diltiazem Other (See Comments)   Ciprofloxacin Other (See Comments)    Last resort due to Myasthia Gravis   Sulfa Antibiotics Rash    Family History  Problem Relation Age of Onset   Lung cancer Sister    Prostate cancer Brother    Lung cancer Brother    Arthritis Other        parent   Colon cancer Neg Hx     Prior to Admission medications   Medication Sig Start Date End Date Taking? Authorizing Provider  Acetaminophen (ARTHRITIS PAIN PO) Take 650 mg by mouth.    [provider]  aspirin 81 MG tablet Take 81 mg by mouth daily.    [provider]  clopidogrel (PLAVIX) 75 MG tablet Take 1 tablet (75 mg total) by mouth daily. Total at least 6 mos therapy w/ clopidogrel + aspirin 06/19/22   Emeterio Reeve, DO  docusate sodium (COLACE) 100 MG capsule Take 1 capsule (100 mg total) by mouth 2 (two) times daily as needed for mild constipation. 06/18/22   Emeterio Reeve, DO  finasteride (PROSCAR) 5 MG tablet Take 5 mg by mouth daily.    [provider]  furosemide (LASIX) 40 MG tablet Take 1 tablet (40 mg total) by mouth daily. Patient taking differently: Take 40 mg by mouth daily. 04/03/20 08/04/22  Alisa Graff, FNP  hydroxypropyl methylcellulose / hypromellose (ISOPTO TEARS / GONIOVISC) 2.5 % ophthalmic solution Place 1 drop into both eyes daily.    [provider]  mycophenolate (CELLCEPT) 250 MG capsule Take 1,000 mg by mouth 2 (two) times daily.     [provider]  polyethylene glycol (MIRALAX / GLYCOLAX) 17 g packet Take 17 g by mouth daily as needed for moderate constipation. 06/18/22   Emeterio Reeve, DO  pravastatin (PRAVACHOL) 20 MG tablet Take 1 tablet (20 mg total) by mouth daily. 07/27/14   [name], MD  pyridostigmine (MESTINON) 60 MG tablet Take 30 mg by mouth 6 (six) times daily.     [provider]  tacrolimus (PROTOPIC) 0.1 % ointment Apply topically 2 (two) times daily. 07/29/21   Ralene Bathe, MD  tamsulosin (FLOMAX) 0.4 MG CAPS Take 0.4 mg by mouth daily.    [provider]    Physical Exam: Vitals:   09/05/22 2255 09/05/22 2300  BP:  107/74  Pulse:  62  Resp:  18  Temp:  98.4 F (36.9 C)  TempSrc:  Oral  SpO2:  100%  Weight: 81.7 kg   Height: '5\' 10"'$  (1.778 m)    Physical Exam Vitals and nursing note reviewed.  Constitutional:      General: He is not in  acute distress.    Comments: Chronically ill-appearing male, short of breath, speaking in short sentences  HENT:     Head: Normocephalic and atraumatic.  Cardiovascular:     Rate and Rhythm: Normal rate and regular rhythm.     Heart sounds: Normal heart sounds.  Pulmonary:     Effort: Tachypnea present.     Breath sounds: Decreased air movement present. Rales present.  Abdominal:     Palpations: Abdomen is soft.     Tenderness: There is no abdominal tenderness.  Musculoskeletal:     Right lower leg: Edema present.     Left lower leg: Edema present.  Neurological:     Mental Status: Mental status is at baseline.     Labs on Admission: I have personally reviewed following labs and imaging studies  CBC: Recent Labs  Lab 09/05/22 2259  WBC 10.1  HGB 11.2*  HCT 37.5*  MCV 96.2  PLT 101   Basic Metabolic Panel: Recent Labs  Lab 09/05/22 2259  NA 141  K 3.9  CL 98  CO2 32  GLUCOSE 126*  BUN 57*  CREATININE 1.49*  CALCIUM 9.0   GFR: Estimated Creatinine Clearance: 31.3 mL/min (A) (by C-G formula based on SCr of 1.49 mg/dL (H)). Liver Function Tests: No results for input(s): "AST", "ALT", "ALKPHOS", "BILITOT", "PROT", "ALBUMIN" in the last 168  hours. No results for input(s): "LIPASE", "AMYLASE" in the last 168 hours. No results for input(s): "AMMONIA" in the last 168 hours. Coagulation Profile: No results for input(s): "INR", "PROTIME" in the last 168 hours. Cardiac Enzymes: No results for input(s): "CKTOTAL", "CKMB", "CKMBINDEX", "TROPONINI" in the last 168 hours. BNP (last 3 results) No results for input(s): "PROBNP" in the last 8760 hours. HbA1C: No results for input(s): "HGBA1C" in the last 72 hours. CBG: No results for input(s): "GLUCAP" in the last 168 hours. Lipid Profile: No results for input(s): "CHOL", "HDL", "LDLCALC", "TRIG", "CHOLHDL", "LDLDIRECT" in the last 72 hours. Thyroid Function Tests: No results for input(s): "TSH", "T4TOTAL", "FREET4", "T3FREE", "THYROIDAB" in the last 72 hours. Anemia Panel: No results for input(s): "VITAMINB12", "FOLATE", "FERRITIN", "TIBC", "IRON", "RETICCTPCT" in the last 72 hours. Urine analysis:    Component Value Date/Time   COLORURINE YELLOW (A) 08/08/2022 1806   APPEARANCEUR CLEAR (A) 08/08/2022 1806   LABSPEC 1.020 08/08/2022 1806   PHURINE 5.0 08/08/2022 1806   GLUCOSEU NEGATIVE 08/08/2022 1806   GLUCOSEU NEGATIVE 06/30/2019 1022   HGBUR NEGATIVE 08/08/2022 1806   BILIRUBINUR NEGATIVE 08/08/2022 1806   BILIRUBINUR n 08/30/2012 1006   KETONESUR 5 (A) 08/08/2022 1806   PROTEINUR NEGATIVE 08/08/2022 1806   UROBILINOGEN 0.2 06/30/2019 1022   NITRITE NEGATIVE 08/08/2022 1806   LEUKOCYTESUR NEGATIVE 08/08/2022 1806    Radiological Exams on Admission: DG Chest 2 View  Result Date: 09/05/2022 CLINICAL DATA:  Shortness of breath EXAM: CHEST - 2 VIEW COMPARISON:  09/29/2021, 08/04/2022 FINDINGS: Small moderate bilateral effusions. Cardiomegaly with vascular congestion and pulmonary edema. Basilar airspace disease. Aortic atherosclerosis. IMPRESSION: Cardiomegaly with vascular congestion, pulmonary edema and small to moderate bilateral effusions. Basilar airspace disease may  be due to atelectasis or pneumonia. Electronically Signed   By: Donavan Foil M.D.   On: 09/05/2022 23:16     Data Reviewed: Relevant notes from primary care and specialist visits, past discharge summaries as available in EHR, including Care Everywhere. Prior diagnostic testing as pertinent to current admission diagnoses Updated medications and problem lists for reconciliation ED course, including vitals, labs, imaging, treatment and response  to treatment Triage notes, nursing and pharmacy notes and ED provider's notes Notable results as noted in HPI   Assessment and Plan: * Acute on chronic systolic CHF (congestive heart failure) (HCC) Bilateral pleural effusion Acute on chronic respiratory failure with hypoxia Patient had increased work of breathing requiring 15 L NRB with EMS, now down to 4 L.  On 3.5 L at baseline BNP over 2000, CXR showing "cardiomegaly with vascular congestion, pulmonary edema and small to moderate bilateral effusions. Basilar airspace disease may be due to atelectasis or pneumonia". IV Lasix.  Not currently on beta-blocker, possibly secondary to his myasthenia.  Not on ARB Declines IR consult for consideration of thoracentesis Daily weights with intake and output monitoring EF in September 2023 showed EF 25 to 30%  AKI (acute kidney injury) ([place]) Creatinine 1.49 up from baseline of 0.93, likely related to Lasix Continue to monitor given ongoing need for IV Lasix Avoid nephrotoxins  Coronary artery disease involving native coronary artery of native heart without angina pectoris Elevated troponin, with history of NSTEMI 05/2022 Troponin 121-130, almost flat, suspect secondary to demand ischemia.  Patient denies chest pain and EKG nonacute  Troponin peaked at above 8000 for NSTEMI September 23 Continue aspirin and Plavix Can consider repeat echo to evaluate for wall motion abnormality   Lambert-Eaton myasthenic syndrome (HCC) Continue pyridostigmine and  CellCept  Essential hypertension BP soft at 107/74  History of bladder cancer BPH Continue finasteride and tamsulosin        DVT prophylaxis: Lovenox  Consults: [facility] cardiology, Dr. Nehemiah Massed  Advance Care Planning:   Code Status: Prior   Family Communication: daughter at bedside  Disposition Plan: Back to previous home environment  Severity of Illness: The appropriate patient status for this patient is INPATIENT. Inpatient status is judged to be reasonable and necessary in order to provide the required intensity of service to ensure the patient's safety. The patient's presenting symptoms, physical exam findings, and initial radiographic and laboratory data in the context of their chronic comorbidities is felt to place them at high risk for further clinical deterioration. Furthermore, it is not anticipated that the patient will be medically stable for discharge from the hospital within 2 midnights of admission.   * I certify that at the point of admission it is my clinical judgment that the patient will require inpatient hospital care spanning beyond 2 midnights from the point of admission due to high intensity of service, high risk for further deterioration and high frequency of surveillance required.*  Author: Athena Masse, MD 09/06/2022 2:37 AM  For on call review [email or web address].

## 2022-09-06 NOTE — Assessment & Plan Note (Signed)
BP soft at 107/74

## 2022-09-06 NOTE — ED Notes (Signed)
Pt with cramping to R hip/leg. Offered tylenol.

## 2022-09-06 NOTE — ED Notes (Signed)
Pt asleep. Not waking. 8am medicines on counter in room

## 2022-09-06 NOTE — ED Provider Notes (Signed)
[facility] Provider Note    Event Date/Time   First MD Initiated Contact with Patient 09/06/22 0011     (approximate)   History   Shortness of Breath   HPI  [name] is a 88 y.o. male who presents to the ED for evaluation of Shortness of Breath   I reviewed cardiology clinic visit from 11/15.  Reduced ejection fraction 25%, CAD, myasthenia gravis.  Recent medical admission 11/6 - 11/9 for a CHF exacerbation.  Patient presents with his daughter for evaluation of increasing shortness of breath, orthopnea and lower extremity swelling.  Reports symptoms are similar as when he was admitted last month for CHF exacerbation.  He reports compliance with his diuretics and similar urine output.  Has only gained a couple pounds of weight in the past few weeks, but also reports poor intake of solids alongside this.  No fevers, productive cough or pain.  Physical Exam   Triage Vital Signs: ED Triage Vitals  Enc Vitals Group     BP 09/05/22 2300 107/74     Pulse Rate 09/05/22 2300 62     Resp 09/05/22 2300 18     Temp 09/05/22 2300 98.4 F (36.9 C)     Temp Source 09/05/22 2300 Oral     SpO2 09/05/22 2300 100 %     Weight 09/05/22 2255 180 lb 0.8 oz (81.7 kg)     Height 09/05/22 2255 '5\' 10"'$  (1.778 m)     Head Circumference --      Peak Flow --      Pain Score 09/05/22 2255 0     Pain Loc --      Pain Edu? --      Excl. in [place]? --     Most recent vital signs: Vitals:   09/05/22 2300  BP: 107/74  Pulse: 62  Resp: 18  Temp: 98.4 F (36.9 C)  SpO2: 100%    General: Awake, no distress.  On nasal cannula, conversational in full sentences.  Well-appearing. CV:  Good peripheral perfusion.  Resp:  Normal effort.  No wheezing. Abd:  No distention.  MSK:  No deformity noted.  Pitting edema to lower extremities bilaterally that are symmetric. Neuro:  No focal deficits appreciated. Other:     ED Results / Procedures / Treatments   Labs (all  labs ordered are listed, but only abnormal results are displayed) Labs Reviewed  BASIC METABOLIC PANEL - Abnormal; Notable for the following components:      Result Value   Glucose, Bld 126 (*)    BUN 57 (*)    Creatinine, Ser 1.49 (*)    GFR, Estimated 43 (*)    All other components within normal limits  CBC - Abnormal; Notable for the following components:   RBC 3.90 (*)    Hemoglobin 11.2 (*)    HCT 37.5 (*)    MCHC 29.9 (*)    RDW 15.8 (*)    All other components within normal limits  BRAIN NATRIURETIC PEPTIDE - Abnormal; Notable for the following components:   B Natriuretic Peptide 2,080.3 (*)    All other components within normal limits  TROPONIN I (HIGH SENSITIVITY) - Abnormal; Notable for the following components:   Troponin I (High Sensitivity) 121 (*)    All other components within normal limits  TROPONIN I (HIGH SENSITIVITY) - Abnormal; Notable for the following components:   Troponin I (High Sensitivity) 130 (*)    All other components within  normal limits  RESP PANEL BY RT-PCR (FLU A&B, COVID) [id number]    EKG Regular rhythm with left bundle morphology without ischemic features by Sgarbossa criteria.  Rate of 68 bpm leftward axis.  RADIOLOGY 2 view CXR interpreted by me with pulmonary vascular congestion and small bilateral pleural effusions.  Official radiology report(s): DG Chest 2 View  Result Date: 09/05/2022 CLINICAL DATA:  Shortness of breath EXAM: CHEST - 2 VIEW COMPARISON:  09/29/2021, 08/04/2022 FINDINGS: Small moderate bilateral effusions. Cardiomegaly with vascular congestion and pulmonary edema. Basilar airspace disease. Aortic atherosclerosis. IMPRESSION: Cardiomegaly with vascular congestion, pulmonary edema and small to moderate bilateral effusions. Basilar airspace disease may be due to atelectasis or pneumonia. Electronically Signed   By: Donavan Foil M.D.   On: 09/05/2022 23:16    PROCEDURES and INTERVENTIONS:  .1-3 Lead EKG  Interpretation  Performed by: Vladimir Crofts, MD Authorized by: Vladimir Crofts, MD     Interpretation: normal     ECG rate:  66   ECG rate assessment: normal     Rhythm: sinus rhythm     Ectopy: none     Conduction: normal     Medications  furosemide (LASIX) injection 80 mg (80 mg Intravenous Given 09/06/22 0055)     IMPRESSION / MDM / ASSESSMENT AND PLAN / ED COURSE  I reviewed the triage vital signs and the nursing notes.  Differential diagnosis includes, but is not limited to, ACS, CHF exacerbation, pleural effusion, pneumothorax  {Patient presents with symptoms of an acute illness or injury that is potentially life-threatening.  88 year old male presents with evidence of CHF exacerbation requiring medical admission.  He is on chronic nasal cannula without significant vital sign derangements.  Mostly just symptomatic with dyspnea and orthopnea and swelling.  Noted to have a congested CXR with bilateral effusions, but none requiring emergent drainage in the ED.  Blood work with elevated troponins that are flat on repeat and likely secondary to his respiratory status.  BNP is elevated and higher than his recent admission.  Testing negative for COVID and flu.  Metabolic panel with slightly worsened creatinine, possibly cardiorenal in etiology.  We will initiate diuresis and consult medicine for admission.  Clinical Course as of 09/06/22 0201  Sat Sep 06, 2022  0146 Reassessed.  Is put out about 150 cc of urine.  We discussed his troponins being elevated, though flat on the repeat assessment.  I recommended the patient be admitted to the hospital and he is agreeable. [DS]    Clinical Course User Index [DS] Vladimir Crofts, MD     FINAL CLINICAL IMPRESSION(S) / ED DIAGNOSES   Final diagnoses:  Acute on chronic congestive heart failure, unspecified heart failure type ([place])  Shortness of breath  Pleural effusion, bilateral     Rx / DC Orders   ED Discharge Orders     None         Note:  This document was prepared using Dragon voice recognition software and may include unintentional dictation errors.   Vladimir Crofts, MD 09/06/22 [phone]

## 2022-09-07 DIAGNOSIS — I5023 Acute on chronic systolic (congestive) heart failure: Secondary | ICD-10-CM

## 2022-09-07 LAB — BASIC METABOLIC PANEL
Anion gap: 8 (ref 5–15)
BUN: 54 mg/dL — ABNORMAL HIGH (ref 8–23)
CO2: 33 mmol/L — ABNORMAL HIGH (ref 22–32)
Calcium: 8.7 mg/dL — ABNORMAL LOW (ref 8.9–10.3)
Chloride: 100 mmol/L (ref 98–111)
Creatinine, Ser: 1.23 mg/dL (ref 0.61–1.24)
GFR, Estimated: 54 mL/min — ABNORMAL LOW (ref >60)
Glucose, Bld: 119 mg/dL — ABNORMAL HIGH (ref 70–99)
Potassium: 3.5 mmol/L (ref 3.5–5.1)
Sodium: 141 mmol/L (ref 135–145)

## 2022-09-07 LAB — IRON AND TIBC
Iron: 19 ug/dL — ABNORMAL LOW (ref 45–182)
Saturation Ratios: 6 % — ABNORMAL LOW (ref 17.9–39.5)
TIBC: 311 ug/dL (ref 250–450)
UIBC: 292 ug/dL

## 2022-09-07 LAB — PHOSPHORUS: Phosphorus: 3.7 mg/dL (ref 2.5–4.6)

## 2022-09-07 LAB — MAGNESIUM: Magnesium: 2.4 mg/dL (ref 1.7–2.4)

## 2022-09-07 LAB — CBC
HCT: 36.7 % — ABNORMAL LOW (ref 39.0–52.0)
Hemoglobin: 10.8 g/dL — ABNORMAL LOW (ref 13.0–17.0)
MCH: 28.1 pg (ref 26.0–34.0)
MCHC: 29.4 g/dL — ABNORMAL LOW (ref 30.0–36.0)
MCV: 95.6 fL (ref 80.0–100.0)
Platelets: 188 10*3/uL (ref 150–400)
RBC: 3.84 MIL/uL — ABNORMAL LOW (ref 4.22–5.81)
RDW: 15.8 % — ABNORMAL HIGH (ref 11.5–15.5)
WBC: 6.1 10*3/uL (ref 4.0–10.5)
nRBC: 0 % (ref 0.0–0.2)

## 2022-09-07 LAB — FOLATE: Folate: 8 ng/mL (ref >5.9)

## 2022-09-07 MED ORDER — MELATONIN 5 MG PO TABS
5.0000 mg | ORAL_TABLET | Freq: Once | ORAL | Status: AC
  Administered 2022-09-08: 5 mg via ORAL
  Filled 2022-09-07: qty 1

## 2022-09-07 NOTE — Progress Notes (Signed)
[facility] Cardiology [facility] Encounter Note  Patient: [name] / Admit Date: 09/06/2022 / Date of Encounter: 09/07/2022, 11:44 AM   Subjective: Is feeling much better today.  Patient is breathing better and able to lie flat.  Lower extremity not significantly changed.  Patient has had 1100 mL of urine output.  No evidence of myocardial infarction or acute coronary syndrome.  Review of Systems: Positive for: Shortness of breath edema Negative for: Vision change, hearing change, syncope, dizziness, nausea, vomiting,diarrhea, bloody stool, stomach pain, cough, congestion, diaphoresis, urinary frequency, urinary pain,skin lesions, skin rashes Others previously listed  Objective: Telemetry: Sinus rhythm Physical Exam: Blood pressure 125/70, pulse 67, temperature 97.7 F (36.5 C), temperature source Temporal, resp. rate 20, height '5\' 10"'$  (1.778 m), weight 81.7 kg, SpO2 96 %. Body mass index is 25.83 kg/m. General: Well developed, well nourished, in no acute distress. Head: Normocephalic, atraumatic, sclera non-icteric, no xanthomas, nares are without discharge. Neck: No apparent masses Lungs: Normal respirations with no wheezes, no rhonchi, basilar rales , some crackles   Heart: Regular rate and rhythm, normal S1 S2, no murmur, no rub, no gallop, PMI is normal size and placement, carotid upstroke normal without bruit, jugular venous pressure normal Abdomen: Soft, non-tender, distended with normoactive bowel sounds. No hepatosplenomegaly. Abdominal aorta is normal size without bruit Extremities: 1+ edema, no clubbing, no cyanosis, no ulcers,  Peripheral: 2+ radial, 2+ femoral, 2+ dorsal pedal pulses Neuro: Alert and oriented. Moves all extremities spontaneously. Psych:  Responds to questions appropriately with a normal affect.  No intake or output data in the 24 hours ending 09/07/22 1144  Inpatient Medications:   aspirin EC  81 mg Oral Daily   clopidogrel  75 mg Oral Daily    enoxaparin (LOVENOX) injection  40 mg Subcutaneous Q24H   feeding supplement  237 mL Oral TID BM   finasteride  5 mg Oral Daily   furosemide  40 mg Intravenous BID   midodrine  5 mg Oral TID WC   mycophenolate  1,000 mg Oral Q12H   pravastatin  20 mg Oral Daily   pyridostigmine  30 mg Oral 6 X Daily   tamsulosin  0.4 mg Oral Daily   Infusions:   Labs: Recent Labs    09/06/22 1410 09/07/22 0630  NA 140 141  K 4.2 3.5  CL 99 100  CO2 34* 33*  GLUCOSE 134* 119*  BUN 50* 54*  CREATININE 1.40* 1.23  CALCIUM 8.8* 8.7*  MG 2.3 2.4  PHOS 3.6 3.7   No results for input(s): "AST", "ALT", "ALKPHOS", "BILITOT", "PROT", "ALBUMIN" in the last 72 hours. Recent Labs    09/06/22 0433 09/07/22 0630  WBC 10.2 6.1  HGB 11.1* 10.8*  HCT 38.0* 36.7*  MCV 97.4 95.6  PLT 225 188   No results for input(s): "CKTOTAL", "CKMB", "TROPONINI" in the last 72 hours. Invalid input(s): "POCBNP" No results for input(s): "HGBA1C" in the last 72 hours.   Weights: Filed Weights   09/05/22 2255  Weight: 81.7 kg     Radiology/Studies:  DG Chest 2 View  Result Date: 09/05/2022 CLINICAL DATA:  Shortness of breath EXAM: CHEST - 2 VIEW COMPARISON:  09/29/2021, 08/04/2022 FINDINGS: Small moderate bilateral effusions. Cardiomegaly with vascular congestion and pulmonary edema. Basilar airspace disease. Aortic atherosclerosis. IMPRESSION: Cardiomegaly with vascular congestion, pulmonary edema and small to moderate bilateral effusions. Basilar airspace disease may be due to atelectasis or pneumonia. Electronically Signed   By: Donavan Foil M.D.   On: 09/05/2022  23:16     Assessment and Recommendation  88 y.o. male with known severe systolic dysfunction congestive heart failure with acute on chronic systolic congestive heart failure with pulmonary edema and lower extremity edema with chronic kidney disease stage III improved at this time with urine output after intravenous Lasix and no evidence of acute  coronary syndrome 1.  Continue intravenous Lasix at this time for acute on chronic systolic dysfunction congestive heart failure.  No other guideline medical therapy at this time due to some hypotension and bradycardia but will consider addition of low-dose beta-blocker and ACE inhibitor as patient progresses through hospitalization 2.  No further cardiac diagnostics necessary at this time 3.  Begin ambulation and cardiac rehabilitation 4.  Patient likely improved enough in the next 24 to 48 hours for discharge with further consideration of torsemide 20 mg p.o. daily with discharge  Signed, Serafina Royals M.D. FACC

## 2022-09-07 NOTE — ED Notes (Signed)
Report given to A. Calle RN

## 2022-09-07 NOTE — ED Notes (Signed)
Breakfast tray delivered at this time.

## 2022-09-07 NOTE — Progress Notes (Signed)
Patient placed on BIPAP 12/5 30%, SpO2 100%. Tolerating well.

## 2022-09-07 NOTE — Progress Notes (Signed)
       CROSS COVER NOTE  NAME: [name] MRN: [id number] DOB : 1925-11-08 ATTENDING PHYSICIAN: Athena Masse, MD    Date of Service   09/07/2022   HPI/Events of Note   Medication request received for sleep aid.  Interventions   Assessment/Plan:  Melatonin 5 mg     This document was prepared using Dragon voice recognition software and may include unintentional dictation errors.  Neomia Glass DNP, MBA, FNP-BC Nurse Practitioner [facility] Pager [phone]

## 2022-09-07 NOTE — Progress Notes (Signed)
NIF done x3 with nose clips on patient. -22, -25, -30 cm H2O measurements recorded. Patient reports normal breathing effort, with no difficulties at rest at this time.

## 2022-09-07 NOTE — Progress Notes (Signed)
[facility] Progress Note  Patient: [name]    LXB:[id number]  DOA: 09/06/2022     Date of Service: the patient was seen and examined on 09/07/2022  Chief Complaint  Patient presents with   Shortness of Breath   Brief hospital course:  [name] is a 88 y.o. male with medical history significant for CAD, HTN, BPH, Lambert-Eaton myasthenic syndrome, systolic CHF (EF 25 to 41% 05/2022) on home O2 at 2L, admitted twice in the past 3 months for CHF exacerbation and respiratory failure requiring BiPAP, most recently from 11/6 to 11/9 who presents to the ED with shortness of breath, orthopnea and lower extremity edema similar to recent prior exacerbations. In the past week he had to go up on hte oxygen to 3.5 from 2L. On his admission in September he was also diagnosed with NSTEMI.  At the present time patient denies chest pain, cough, fever or chills.  Denies leg pain.  On arrival of EMS patient had increased work of breathing and was initially placed on NRB at 15 L ED course and data review: BP 107/74 with pulse 62.  Afebrile with O2 sat of 100% on 4 L.  Troponin 121-130 with BNP 2080.Marland Kitchen  CBC significant for hemoglobin 11.2, down from 12.4.Creatinine 1.49 up from 0.93 about 4 weeks prior EKG, personally viewed and interpreted showing NSR at 68 with LVH and nonischemic ST-T wave changes chest x-ray showed CHF and possible pneumonia as follows: IMPRESSION: Cardiomegaly with vascular congestion, pulmonary edema and small to moderate bilateral effusions. Basilar airspace disease may be due to atelectasis or pneumonia.   Patient treated with IV Lasix and hospitalist consulted for admission    Assessment and Plan:  # Acute on chronic systolic CHF (congestive heart failure) (HCC) Bilateral pleural effusion Acute on chronic respiratory failure with hypoxia Patient had increased work of breathing requiring 15 L NRB with EMS, now down to 4 L.  On 3.5 L at baseline BNP over 2000, CXR  showing "cardiomegaly with vascular congestion, pulmonary edema and small to moderate bilateral effusions. Basilar airspace disease may be due to atelectasis or pneumonia". IV Lasix.  Not currently on beta-blocker, possibly secondary to his myasthenia.  Not on ARB Declines IR consult for consideration of thoracentesis Daily weights with intake and output monitoring EF in September 2023 showed EF 25 to 30% Continue Lasix 40 mg IV twice daily   # AKI (acute kidney injury) ([place]) Creatinine 1.49 up from baseline of 0.93, likely related to Lasix Continue to monitor given ongoing need for IV Lasix Avoid nephrotoxins C 1.39--1.4--1.23     # Coronary artery disease involving native coronary artery of native heart without angina pectoris Elevated troponin, with history of NSTEMI 05/2022 Troponin 121-130, almost flat, suspect secondary to demand ischemia.  Patient denies chest pain and EKG nonacute  Troponin peaked at above 8000 for NSTEMI September 23 Continue aspirin and Plavix Can consider repeat echo to evaluate for wall motion abnormality Patient was seen by cardiology, recommended no ischemic workup at this time.    Lambert-Eaton myasthenic syndrome (HCC) Continue pyridostigmine and CellCept   Essential hypertension BP soft at 107/74 12/10 blood pressure dropped yesterday, patient received peripheral Levophed, critical care was consulted which was stopped after improvement in the blood pressure.  Patient received midodrine 10 mg p.o. one-time dose and started midodrine 5 mg p.o. 3 times daily   History of bladder cancer BPH Continue finasteride and tamsulosin    Leg cramps, multifactorial Check iron profile,  B12 and vitamin D level   Body mass index is 25.83 kg/m.  Interventions:       Diet: Heart healthy DVT Prophylaxis: Subcutaneous Lovenox   Advance goals of care discussion: Full code  Family Communication: family was present at bedside, at the time of interview.   The pt provided permission to discuss medical plan with the family. Opportunity was given to ask question and all questions were answered satisfactorily.   Disposition:  Pt is from Home, admitted with acute on chronic systolic CHF exacerbation, still has shortness of breath, volume overload and low blood pressure, which precludes a safe discharge. Discharge to home, when stable, may need few more days to stay.  Subjective: No significant overnight events, patient feels little bit improvement in the breathing but is still has shortness of breath and significant lower extremity edema.  Denied any chest pain or palpitation, no any other active issues.   Physical Exam: General:  alert oriented to time, place, and person.  Appear in mild distress, affect appropriate Eyes: PERRLA ENT: Oral Mucosa Clear, moist  Neck: no JVD,  Cardiovascular: S1 and S2 Present, no Murmur,  Respiratory: good respiratory effort, Bilateral Air entry equal and Decreased, b/l Crackles, no wheezes Abdomen: Bowel Sound present, Soft and no tenderness,  Skin: no rashes Extremities: 4+ Pedal edema, no calf tenderness Neurologic: without any new focal findings Gait not checked due to patient safety concerns  Vitals:   09/07/22 1230 09/07/22 1330 09/07/22 1400 09/07/22 1430  BP: 97/63 (!) 143/100 (!) 107/49 (!) 109/57  Pulse: 68 (!) 58 69 64  Resp: '16 18 15 17  '$ Temp:    97.7 F (36.5 C)  TempSrc:    Temporal  SpO2: 97% 100% 95% 100%  Weight:      Height:       No intake or output data in the 24 hours ending 09/07/22 1454 Filed Weights   09/05/22 2255  Weight: 81.7 kg    Data Reviewed: I have personally reviewed and interpreted daily labs, tele strips, imagings as discussed above. I reviewed all nursing notes, pharmacy notes, vitals, pertinent old records I have discussed plan of care as described above with RN and patient/family.  CBC: Recent Labs  Lab 09/05/22 2259 09/06/22 0433 09/07/22 0630  WBC  10.1 10.2 6.1  HGB 11.2* 11.1* 10.8*  HCT 37.5* 38.0* 36.7*  MCV 96.2 97.4 95.6  PLT 243 225 952   Basic Metabolic Panel: Recent Labs  Lab 09/05/22 2259 09/06/22 0433 09/06/22 1410 09/07/22 0630  NA 141 140 140 141  K 3.9 4.3 4.2 3.5  CL 98 99 99 100  CO2 32 34* 34* 33*  GLUCOSE 126* 136* 134* 119*  BUN 57* 52* 50* 54*  CREATININE 1.49* 1.39* 1.40* 1.23  CALCIUM 9.0 8.9 8.8* 8.7*  MG  --   --  2.3 2.4  PHOS  --   --  3.6 3.7    Studies: No results found.  Scheduled Meds:  aspirin EC  81 mg Oral Daily   clopidogrel  75 mg Oral Daily   enoxaparin (LOVENOX) injection  40 mg Subcutaneous Q24H   feeding supplement  237 mL Oral TID BM   finasteride  5 mg Oral Daily   furosemide  40 mg Intravenous BID   midodrine  5 mg Oral TID WC   mycophenolate  1,000 mg Oral Q12H   pravastatin  20 mg Oral Daily   pyridostigmine  30 mg Oral 6 X Daily  tamsulosin  0.4 mg Oral Daily   Continuous Infusions: PRN Meds: acetaminophen **OR** acetaminophen, ipratropium-albuterol, ondansetron **OR** ondansetron (ZOFRAN) IV  Time spent: 55 minutes  Author: Val Riles. MD [facility] 09/07/2022 2:54 PM  To reach On-call, see care teams to locate the attending and reach out to them via [email or web address]. If 7PM-7AM, please contact night-coverage If you still have difficulty reaching the attending provider, please page the [facility] (Director on Call) for [facility] on amion for assistance.

## 2022-09-08 ENCOUNTER — Inpatient Hospital Stay: Payer: Medicare Other

## 2022-09-08 ENCOUNTER — Other Ambulatory Visit: Payer: Self-pay

## 2022-09-08 ENCOUNTER — Encounter: Payer: Self-pay | Admitting: Internal Medicine

## 2022-09-08 DIAGNOSIS — I5023 Acute on chronic systolic (congestive) heart failure: Secondary | ICD-10-CM | POA: Diagnosis not present

## 2022-09-08 LAB — BLOOD GAS, VENOUS
Acid-Base Excess: 12.4 mmol/L — ABNORMAL HIGH (ref 0.0–2.0)
Bicarbonate: 39.6 mmol/L — ABNORMAL HIGH (ref 20.0–28.0)
O2 Saturation: 97.3 %
Patient temperature: 37
pCO2, Ven: 61 mmHg — ABNORMAL HIGH (ref 44–60)
pH, Ven: 7.42 (ref 7.25–7.43)
pO2, Ven: 72 mmHg — ABNORMAL HIGH (ref 32–45)

## 2022-09-08 LAB — BLOOD GAS, ARTERIAL
Acid-Base Excess: 12.1 mmol/L — ABNORMAL HIGH (ref 0.0–2.0)
Bicarbonate: 38.7 mmol/L — ABNORMAL HIGH (ref 20.0–28.0)
FIO2: 0.36 %
O2 Saturation: 97.7 %
Patient temperature: 37
pCO2 arterial: 57 mmHg — ABNORMAL HIGH (ref 32–48)
pH, Arterial: 7.44 (ref 7.35–7.45)
pO2, Arterial: 74 mmHg — ABNORMAL LOW (ref 83–108)

## 2022-09-08 LAB — BASIC METABOLIC PANEL
Anion gap: 10 (ref 5–15)
BUN: 54 mg/dL — ABNORMAL HIGH (ref 8–23)
CO2: 34 mmol/L — ABNORMAL HIGH (ref 22–32)
Calcium: 8.7 mg/dL — ABNORMAL LOW (ref 8.9–10.3)
Chloride: 97 mmol/L — ABNORMAL LOW (ref 98–111)
Creatinine, Ser: 1.26 mg/dL — ABNORMAL HIGH (ref 0.61–1.24)
GFR, Estimated: 53 mL/min — ABNORMAL LOW (ref >60)
Glucose, Bld: 104 mg/dL — ABNORMAL HIGH (ref 70–99)
Potassium: 3.7 mmol/L (ref 3.5–5.1)
Sodium: 141 mmol/L (ref 135–145)

## 2022-09-08 LAB — CBC
HCT: 36.7 % — ABNORMAL LOW (ref 39.0–52.0)
Hemoglobin: 11 g/dL — ABNORMAL LOW (ref 13.0–17.0)
MCH: 28.4 pg (ref 26.0–34.0)
MCHC: 30 g/dL (ref 30.0–36.0)
MCV: 94.8 fL (ref 80.0–100.0)
Platelets: 184 10*3/uL (ref 150–400)
RBC: 3.87 MIL/uL — ABNORMAL LOW (ref 4.22–5.81)
RDW: 15.9 % — ABNORMAL HIGH (ref 11.5–15.5)
WBC: 5.1 10*3/uL (ref 4.0–10.5)
nRBC: 0 % (ref 0.0–0.2)

## 2022-09-08 LAB — VITAMIN D 25 HYDROXY (VIT D DEFICIENCY, FRACTURES): Vit D, 25-Hydroxy: 30.73 ng/mL (ref 30–100)

## 2022-09-08 LAB — VITAMIN B12: Vitamin B-12: 449 pg/mL (ref 180–914)

## 2022-09-08 LAB — MAGNESIUM: Magnesium: 2.4 mg/dL (ref 1.7–2.4)

## 2022-09-08 LAB — PHOSPHORUS: Phosphorus: 3.4 mg/dL (ref 2.5–4.6)

## 2022-09-08 LAB — MRSA NEXT GEN BY PCR, NASAL: MRSA by PCR Next Gen: NOT DETECTED

## 2022-09-08 MED ORDER — FUROSEMIDE 10 MG/ML IJ SOLN
40.0000 mg | Freq: Two times a day (BID) | INTRAMUSCULAR | Status: DC
  Administered 2022-09-08 – 2022-09-12 (×9): 40 mg via INTRAVENOUS
  Filled 2022-09-08 (×8): qty 4

## 2022-09-08 MED ORDER — VITAMIN D (ERGOCALCIFEROL) 1.25 MG (50000 UNIT) PO CAPS
50000.0000 [IU] | ORAL_CAPSULE | ORAL | Status: DC
  Administered 2022-09-08: 50000 [IU] via ORAL
  Filled 2022-09-08: qty 1

## 2022-09-08 MED ORDER — FUROSEMIDE 10 MG/ML IJ SOLN: 20.0000 mg | Freq: Two times a day (BID) | INTRAMUSCULAR | Status: DC

## 2022-09-08 MED ORDER — GUAIFENESIN ER 600 MG PO TB12
1200.0000 mg | ORAL_TABLET | Freq: Two times a day (BID) | ORAL | Status: DC
  Administered 2022-09-08 – 2022-09-12 (×8): 1200 mg via ORAL
  Filled 2022-09-08 (×8): qty 2

## 2022-09-08 MED ORDER — SODIUM CHLORIDE 0.9 % IV SOLN
300.0000 mg | INTRAVENOUS | Status: AC
  Administered 2022-09-08 – 2022-09-10 (×3): 300 mg via INTRAVENOUS
  Filled 2022-09-08 (×3): qty 300

## 2022-09-08 MED ORDER — CHLORHEXIDINE GLUCONATE CLOTH 2 % EX PADS
6.0000 | MEDICATED_PAD | Freq: Every day | CUTANEOUS | Status: DC
  Administered 2022-09-08 – 2022-09-12 (×5): 6 via TOPICAL

## 2022-09-08 NOTE — Consult Note (Signed)
   Heart Failure Nurse Navigator Note  HFrEF 25-30%.  Akinesis of the left ventricle, mid apical anterior septal wall, apical segment and anterior segment.  Left ventricular internal cavity is moderately dilated.  No LVH.  Moderate biatrial enlargement.  Moderate to severe mitral regurgitation.  Moderate tricuspid regurgitation.       Comorbidities:  Asthma Coronary artery disease Hypertension Hyperlipidemia Lambert-Eaton myasthenic syndrome Chronic lymphedema  Medications:  Aspirin 81 mg daily Plavix 75 mg daily Furosemide 40 mg IV 2 times a day Midodrine 5 mg 3 times a day with meals Pravachol 20 mg daily Tamsulosin 0.4 mg daily  Labs:  Sodium 141, potassium 3.7, chloride 97, CO2 34, BUN 54, creatinine 1.26, estimated GFR 53, magnesium 2.4, phosphorus 3.4, vitamin B12 449. Weight not documented Blood pressure 103/77 Intake 120 mL Output 350 mL    Presented to the emergency room with complaints of shortness of breath, orthopnea, lower extremity edema and having to titrate up his oxygen usage.  Was last admitted to the hospital on August 04, 2022 with the same symptoms.   Initial meeting with patient today, he was sitting up in the chair at bedside.  Daughter-in-law,Gail was present.  He states at home he had continued to weigh himself daily with see it fluctuates 1 pound, also noted that he had a 2.8 pound weight gain.  Planed calling the heart failure clinic if he gains 2 pounds overnight or total of 5 pounds within the week.  He and daughter-in-law both state that he had not been eating foods from [place] etc.  Also discussed his fluid intake, he states that he drinks 3 to 4-12 ounce cups of coffee, 12 ounces of cranberry juice and felt that he was drinking 18 ounces of well water.  Reminded of no more than 64 ounces total fluid intake daily.   Also discussed why he did not show to his appointment on November 21 in the outpatient heart failure clinic.   Daughter-in-law states that they were not aware of that appointment.  States that he will be coming to his appointment on December 19 at 4 PM.  He has a 4% no-show which is 6 out of 158 appointments.  Daughter-in-law was given written info pertaining to the appointment/address/phone number.  Pricilla Riffle RN [facility]  He had no further questions.

## 2022-09-08 NOTE — Progress Notes (Addendum)
Neurology plan of care  This is a 88 yo man with Lambert-Eaton Myasthenic Syndrome (LEMS) and hx severe systolic HF admitted for worsening SOB with bilat pleural effusions and pulm edema from CHF exacerbation. Today he started feeling more SOB and also reported double vision over the past few days. He has been receiving diuretics although does not have a significant net negative fluid balance. His pleural effusions cannot be tapped at the moment given he is on plavix. Neurology was called this evening (we are off-site after 4pm) regarding his respiratory decline and the question of starting IVIG. Prior to this evening we had not been consulted on the patient. Last NIF was -18. His respiratory decline is concerning given his age and general frailty of any patient with myasthenia. The fact that his ABG was not concerning does not reduce overall concern since ABG in myasthenia can look ok until they become too tired and decompensate quickly. I do not see any medications he received that could have precipitated a myasthenic crisis although I will stop his statin for now given his tenuous status. I recommended he be moved to stepdown in ICU. Dr. Lake Bells has put him on bipap. Recommend respiratory mgmt per ICU team, will defer to them on whether escalation of care / intubation becomes necessary and when. He has plenty of reasons to be dyspneic and without an in-person neurologic exam I cannot assess how much of this is 2/2 LEMS vs other etiologies, and therefore cannot recommend IVIG be started overnight. It is not a benign medication and has additional potential complications in a patient who is already volume overloaded. Moreover IVIG would not benefit the patient for 2-3 days after starting. He has never had a myasthenic crisis in the past but has required intubation in the past in the setting of CHF exacerbations. Hold IVIG for now, we will evaluate him tomorrow AM and provide further guidance.  D/w Dr. Dwyane Dee and  [facility] by phone.  Su Monks, MD [facility] [phone]  If 7pm- 7am, please page neurology on call as listed in [place].

## 2022-09-08 NOTE — Progress Notes (Signed)
NIF not done at this time. Pt and family requesting pt be placed on BIPAP.

## 2022-09-08 NOTE — Progress Notes (Signed)
[facility] Progress Note  Patient: [name]    ZOX:[id number]  DOA: 09/06/2022     Date of Service: the patient was seen and examined on 09/08/2022  Chief Complaint  Patient presents with   Shortness of Breath   Brief hospital course:  [name] is a 88 y.o. male with medical history significant for CAD, HTN, BPH, Lambert-Eaton myasthenic syndrome, systolic CHF (EF 25 to 81% 05/2022) on home O2 at 2L, admitted twice in the past 3 months for CHF exacerbation and respiratory failure requiring BiPAP, most recently from 11/6 to 11/9 who presents to the ED with shortness of breath, orthopnea and lower extremity edema similar to recent prior exacerbations. In the past week he had to go up on hte oxygen to 3.5 from 2L. On his admission in September he was also diagnosed with NSTEMI.  At the present time patient denies chest pain, cough, fever or chills.  Denies leg pain.  On arrival of EMS patient had increased work of breathing and was initially placed on NRB at 15 L ED course and data review: BP 107/74 with pulse 62.  Afebrile with O2 sat of 100% on 4 L.  Troponin 121-130 with BNP 2080.Marland Kitchen  CBC significant for hemoglobin 11.2, down from 12.4.Creatinine 1.49 up from 0.93 about 4 weeks prior EKG, personally viewed and interpreted showing NSR at 68 with LVH and nonischemic ST-T wave changes chest x-ray showed CHF and possible pneumonia as follows: IMPRESSION: Cardiomegaly with vascular congestion, pulmonary edema and small to moderate bilateral effusions. Basilar airspace disease may be due to atelectasis or pneumonia.   Patient treated with IV Lasix and hospitalist consulted for admission    Assessment and Plan:  # Acute on chronic systolic CHF (congestive heart failure) (HCC) Bilateral pleural effusion Acute on chronic respiratory failure with hypoxia Patient had increased work of breathing requiring 15 L NRB with EMS, now down to 4 L.  On 3.5 L at baseline BNP over 2000, CXR  showing "cardiomegaly with vascular congestion, pulmonary edema and small to moderate bilateral effusions. Basilar airspace disease may be due to atelectasis or pneumonia". IV Lasix.  Not currently on beta-blocker, possibly secondary to his myasthenia.  Not on ARB Declines IR consult for consideration of thoracentesis Daily weights with intake and output monitoring EF in September 2023 showed EF 25 to 30% Continue Lasix 40 mg IV twice daily   Lambert-Eaton myasthenic syndrome (HCC) Continue pyridostigmine and CellCept 12/11 c/o worsening of shortness of breath, could be exacerbation of myasthenia gravis NIF -18 cm H2O Notified to pulmonary critical care, patient's condition may get worse and may need intubation Currently patient is full code   # AKI (acute kidney injury) ([place]) Creatinine 1.49 up from baseline of 0.93, likely related to Lasix Continue to monitor given ongoing need for IV Lasix Avoid nephrotoxins C 1.39--1.4--1.23 --1.26    # Coronary artery disease involving native coronary artery of native heart without angina pectoris Elevated troponin, with history of NSTEMI 05/2022 Troponin 121-130, almost flat, suspect secondary to demand ischemia.  Patient denies chest pain and EKG nonacute  Troponin peaked at above 8000 for NSTEMI September 23 Continue aspirin and Plavix Can consider repeat echo to evaluate for wall motion abnormality Patient was seen by cardiology, recommended no ischemic workup at this time.     Essential hypertension BP soft at 107/74 12/10 blood pressure dropped yesterday, patient received peripheral Levophed, critical care was consulted which was stopped after improvement in the blood pressure.  Patient received midodrine 10 mg p.o. one-time dose and started midodrine 5 mg p.o. 3 times daily   History of bladder cancer BPH Continue finasteride and tamsulosin    Leg cramps, most likely due to iron deficiency Iron level 19, saturation ratio 6%,  started Venofer 300 mg IV daily x 3 doses Folic acid 8.0, Z61 level 449 within normal range and vitamin D 30 at lower end, started oral supplement.    Body mass index is 25.83 kg/m.  Interventions:       Diet: Heart healthy DVT Prophylaxis: Subcutaneous Lovenox   Advance goals of care discussion: Full code  Family Communication: family was present at bedside, at the time of interview.  The pt provided permission to discuss medical plan with the family. Opportunity was given to ask question and all questions were answered satisfactorily.   Disposition:  Pt is from Home, admitted with acute on chronic systolic CHF exacerbation, still has shortness of breath, volume overload and low blood pressure, which precludes a safe discharge. Discharge to home, when stable, may need few more days to stay.  Subjective: No significant overnight events, in the morning time patient stated that he is feeling fine, no worsening of respiratory distress, edema remains same as patient is unable to keep legs elevated. In the afternoon patient's breathing got worse, pulmonary critical care was consulted as above. Patient remained full code   Physical Exam: General:  alert oriented to time, place, and person.  Appear in mild distress, affect appropriate Eyes: PERRLA ENT: Oral Mucosa Clear, moist  Neck: no JVD,  Cardiovascular: S1 and S2 Present, no Murmur,  Respiratory: good respiratory effort, Bilateral Air entry equal and Decreased, b/l Crackles, no wheezes Abdomen: Bowel Sound present, Soft and no tenderness,  Skin: no rashes Extremities: 4+ Pedal edema, no calf tenderness Neurologic: without any new focal findings Gait not checked due to patient safety concerns  Vitals:   09/08/22 0857 09/08/22 0923 09/08/22 1131 09/08/22 1449  BP: 103/77  (!) 104/48 (!) 103/57  Pulse: 60  (!) 54   Resp: (!) 22  18 (!) 22  Temp: 98.3 F (36.8 C)  98.2 F (36.8 C) (!) 97.3 F (36.3 C)  TempSrc:    Oral   SpO2: 97% 95% 97% 91%  Weight:      Height:        Intake/Output Summary (Last 24 hours) at 09/08/2022 1541 Last data filed at 09/08/2022 1415 Gross per 24 hour  Intake 480 ml  Output 850 ml  Net -370 ml   Filed Weights   09/05/22 2255  Weight: 81.7 kg    Data Reviewed: I have personally reviewed and interpreted daily labs, tele strips, imagings as discussed above. I reviewed all nursing notes, pharmacy notes, vitals, pertinent old records I have discussed plan of care as described above with RN and patient/family.  CBC: Recent Labs  Lab 09/05/22 2259 09/06/22 0433 09/07/22 0630 09/08/22 0607  WBC 10.1 10.2 6.1 5.1  HGB 11.2* 11.1* 10.8* 11.0*  HCT 37.5* 38.0* 36.7* 36.7*  MCV 96.2 97.4 95.6 94.8  PLT 243 225 188 096   Basic Metabolic Panel: Recent Labs  Lab 09/05/22 2259 09/06/22 0433 09/06/22 1410 09/07/22 0630 09/08/22 0607  NA 141 140 140 141 141  K 3.9 4.3 4.2 3.5 3.7  CL 98 99 99 100 97*  CO2 32 34* 34* 33* 34*  GLUCOSE 126* 136* 134* 119* 104*  BUN 57* 52* 50* 54* 54*  CREATININE 1.49* 1.39* 1.40*  1.23 1.26*  CALCIUM 9.0 8.9 8.8* 8.7* 8.7*  MG  --   --  2.3 2.4 2.4  PHOS  --   --  3.6 3.7 3.4    Studies: DG Chest Port 1 View  Result Date: 09/08/2022 CLINICAL DATA:  Shortness of breath. EXAM: PORTABLE CHEST 1 VIEW COMPARISON:  September 05, 2022. FINDINGS: Stable cardiomegaly. Stable bilateral pleural effusions are noted with associated bibasilar atelectasis or edema. Bony thorax is unremarkable. IMPRESSION: Stable cardiomegaly with central pulmonary vascular congestion. Stable bilateral pleural effusions with associated bibasilar atelectasis or pneumonia. Aortic Atherosclerosis (ICD10-I70.0). Electronically Signed   By: Marijo Conception M.D.   On: 09/08/2022 15:38    Scheduled Meds:  aspirin EC  81 mg Oral Daily   clopidogrel  75 mg Oral Daily   enoxaparin (LOVENOX) injection  40 mg Subcutaneous Q24H   feeding supplement  237 mL Oral TID BM    finasteride  5 mg Oral Daily   furosemide  40 mg Intravenous BID   midodrine  5 mg Oral TID WC   mycophenolate  1,000 mg Oral Q12H   pravastatin  20 mg Oral Daily   pyridostigmine  30 mg Oral 6 X Daily   tamsulosin  0.4 mg Oral Daily   Continuous Infusions:  iron sucrose 300 mg (09/08/22 1013)   PRN Meds: acetaminophen **OR** acetaminophen, ipratropium-albuterol, ondansetron **OR** ondansetron (ZOFRAN) IV  Time spent: 55 minutes  Author: Val Riles. MD [facility] 09/08/2022 3:41 PM  To reach On-call, see care teams to locate the attending and reach out to them via [email or web address]. If 7PM-7AM, please contact night-coverage If you still have difficulty reaching the attending provider, please page the [facility] (Director on Call) for [facility] on amion for assistance.

## 2022-09-08 NOTE — Progress Notes (Signed)
1835 Report received from [facility] on 2A. 1700 Patient arrived on unit via bed. CHG bath done.

## 2022-09-08 NOTE — Progress Notes (Signed)
[facility] PCCM  Will move to ICU for closer monitoring Discussed with neuro, they would prefer to examine him in person prior to initiating any myasthenia treatment.  Appreciate neuro, [facility] support  Roselie Awkward, MD  PCCM Pager: [phone] Cell: [phone] After 7:00 pm call Elink  [phone]

## 2022-09-08 NOTE — Consult Note (Signed)
NAME:  [name], MRN:  [id number], DOB:  1925-12-31, LOS: 2 ADMISSION DATE:  09/06/2022, CONSULTATION DATE:  12/11 REFERRING MD:  Dwyane Dee, CHIEF COMPLAINT:  "don't feel right"   History of Present Illness:  88 year old male with a past medical history significant for severe systolic heart failure who was admitted in the context of worsening shortness of breath with bilateral pleural effusions and pulmonary edema from a CHF exacerbation.  He has been receiving diuresis during this hospital visit and pulmonary critical care medicine was consulted on September 09, 2019 3 in the afternoon because "I do not feel right".  He has a hard time characterizing his symptoms but he says that he does feel a bit more short of breath than baseline.  He is not gasping for air.  He denies trouble swallowing, says that he coughed up a little bit of mucus a few minutes ago.  He says that in the last 2 or 3 days he has had worsening double vision.  He has been receiving diuretic medicines and in general this has been effective though yesterday his ins and outs do not demonstrate a significant net negative fluid balance.  He denies chest pain or fevers or chills.  He does not note other weakness that is consistent with flares of myasthenia.  He has been treated with CellCept and Mestinon.  In the last few days his negative inspiratory force numbers have been running -16 to -18.   Pertinent  Medical History   Past Medical History:  Diagnosis Date   Abnormal chest CT 03/28/2017   Allergy    Anemia    Angina pectoris ([place]) 04/02/2011   Aortic aneurysm ([place]) 03/28/2017   Saw Dr Genevive Bi 04/16/17 - recommended f/u CT in 3 months.     Atherosclerosis of both carotid arteries 07/17/2014   Basal cell carcinoma 06/25/2021   Left ant neck. Nodulocystic pattern, close to margin   Benign lipomatous neoplasm of skin and subcutaneous tissue of left arm 10/04/2013   Bilateral carotid artery stenosis 08/30/2014   Overview:  Less  than 50% 2015   Bladder cancer ([place]) 03/05/2013   CAD (coronary artery disease) 06/18/2014   Cancer ([place]) 11/24/2012   bladder. BCG treatments by Dr Jacqlyn Larsen.   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 WD SCC. Re-shaved 01/14/2016. Excised 02/05/2016, margins free.   Squamous cell carcinoma of skin 11/03/2018   Right cheek ant. to sideburn. Poorly differentiated.   Squamous cell carcinoma of skin 02/02/2019   Right mid dorsum forearm. WD SCC. EDC.     Significant Hospital Events: Including procedures, antibiotic start and stop dates in addition to other pertinent events   September 2023 TTE showed LVEF 25 to 30% September 08, 2022 chest x-ray showed bilateral pleural effusions, layering, bibasilar atelectasis  Interim History / Subjective:  As above  Objective   Blood pressure (!) 103/57, pulse (!) 54, temperature (!) 97.3 F (36.3 C), temperature source Oral, resp. rate (!) 22, height '5\' 10"'$  (1.778 m), weight 81.7 kg, SpO2 91 %.        Intake/Output Summary (Last 24 hours) at 09/08/2022 1612 Last data filed at 09/08/2022 1415 Gross per 24 hour  Intake 480 ml  Output 850 ml  Net -370 ml   Filed Weights   09/05/22 2255  Weight:  81.7 kg    Examination:  General:  generally weak comfortably in chair HENT: NCAT OP clear PULM: Diminished bases B, normal effort CV: RRR, heart murmur GI: BS+, soft, nontender MSK: normal bulk and tone Derm: edema in legs bilaterally Neuro: awake, alert, [facility] Problem list     Assessment & Plan:  Chronic respiratory failure with hypercarbia Myasthenia gravis, possible flare Chronic systolic heart failure, acute exacerbation Shortness of breath Bilateral pleural effusions  Discussion: We have been consulted for worsening dyspnea, he has mild hypercarbia, appears generally weak.  He has many reasons to be short of breath including bilateral pleural effusions and a CHF exacerbation but I do worry that he may have a myasthenia flare because his negative inspiratory force numbers are running a bit lower than normal.  Baseline numbers from recent hospitalizations have been -28 to -30.  The last 24 hours she has been running -16 to -18.  While we continue diuresing him I think we may need to consider some sort of other treatment for myasthenia.  Plan: Neurology consult Continue mycophenolate Continue Mestinon Consider IVIG, will defer to neurology Start BiPAP now for chronic hypercarbia Check ABG to look for baseline chronic resting hypercarbia as this would help get approval for nighttime BiPAP Flutter valve CoughAssist Guaifenesin Hold Plavix with plan for thoracentesis later in the week  CODE STATUS: The patient told me I do not want to go on a ventilator.  But then he very quickly said weight you need to check what is in the chart and defer to my daughter.  The patient's son indicates that he has made this statement in the past and in general does not want to go on a ventilator but the patient's daughter who is his healthcare power of attorney is somewhat more insistent on the idea of full code.  Will need to continue the assessing this.  Best Practice  (right click and "Reselect all SmartList Selections" daily)   Per HPI  Labs   CBC: Recent Labs  Lab 09/05/22 2259 09/06/22 0433 09/07/22 0630 09/08/22 0607  WBC 10.1 10.2 6.1 5.1  HGB 11.2* 11.1* 10.8* 11.0*  HCT 37.5* 38.0* 36.7* 36.7*  MCV 96.2 97.4 95.6 94.8  PLT 243 225 188 409    Basic Metabolic Panel: Recent Labs  Lab 09/05/22 2259 09/06/22 0433 09/06/22 1410 09/07/22 0630 09/08/22 0607  NA 141 140 140 141 141  K 3.9 4.3 4.2 3.5 3.7  CL 98 99 99 100 97*  CO2 32 34* 34* 33* 34*  GLUCOSE 126* 136* 134* 119* 104*  BUN 57* 52* 50* 54* 54*  CREATININE 1.49* 1.39* 1.40* 1.23 1.26*  CALCIUM 9.0 8.9 8.8* 8.7* 8.7*  MG  --   --  2.3 2.4 2.4  PHOS  --   --  3.6 3.7 3.4   GFR: Estimated Creatinine Clearance: 37 mL/min (A) (by C-G formula  based on SCr of 1.26 mg/dL (H)). Recent Labs  Lab 09/05/22 2259 09/06/22 0433 09/06/22 1552 09/07/22 0630 09/08/22 0607  WBC 10.1 10.2  --  6.1 5.1  LATICACIDVEN  --   --  2.3*  --   --     Liver Function Tests: No results for input(s): "AST", "ALT", "ALKPHOS", "BILITOT", "PROT", "ALBUMIN" in the last 168 hours. No results for input(s): "LIPASE", "AMYLASE" in the last 168 hours. No results for input(s): "AMMONIA" in the last 168 hours.  ABG    Component Value Date/Time   PHART 7.45 06/14/2022 1930   PCO2ART 52 (H) 06/14/2022 1930   PO2ART 125 (H) 06/14/2022 1930   HCO3 39.6 (H) 09/08/2022 1504   O2SAT 97.3 09/08/2022 1504     Coagulation Profile: No results for input(s): "INR", "PROTIME" in the last 168 hours.  Cardiac Enzymes: No results for input(s): "CKTOTAL", "CKMB", "CKMBINDEX", "TROPONINI" in the last 168 hours.  HbA1C: Hgb A1c MFr Bld  Date/Time Value Ref Range Status  06/16/2022 02:01 AM 5.5 4.8 - 5.6 % Final    Comment:    (NOTE) Pre diabetes:          5.7%-6.4%  Diabetes:              >6.4%  Glycemic control for   <7.0% adults with diabetes   02/22/2021 12:34 PM 5.9 4.6 - 6.5 % Final     Comment:    Glycemic Control Guidelines for People with Diabetes:Non Diabetic:  <6%Goal of Therapy: <7%Additional Action Suggested:  >8%     CBG: No results for input(s): "GLUCAP" in the last 168 hours.  Review of Systems:   Gen: Denies fever, chills, weight change, fatigue, night sweats HEENT: Denies blurred vision, double vision, hearing loss, tinnitus, sinus congestion, rhinorrhea, sore throat, neck stiffness, dysphagia PULM: per HPI CV: Denies chest pain, edema, orthopnea, paroxysmal nocturnal dyspnea, palpitations GI: Denies abdominal pain, nausea, vomiting, diarrhea, hematochezia, melena, constipation, change in bowel habits GU: Denies dysuria, hematuria, polyuria, oliguria, urethral discharge Endocrine: Denies hot or cold intolerance, polyuria, polyphagia or appetite change Derm: Denies rash, dry skin, scaling or peeling skin change Heme: Denies easy bruising, bleeding, bleeding gums Neuro: Denies headache, numbness, weakness, slurred speech, loss of memory or consciousness   Past Medical History:  He,  has a past medical history of Abnormal chest CT (03/28/2017), Allergy, Anemia, Angina pectoris ([place]) (04/02/2011), Aortic aneurysm ([place]) (03/28/2017), Atherosclerosis of both carotid arteries (07/17/2014), Basal cell carcinoma (06/25/2021), Benign lipomatous neoplasm of skin and subcutaneous tissue of left arm (10/04/2013), Bilateral carotid artery stenosis (08/30/2014), Bladder cancer ([place]) (03/05/2013), CAD (coronary artery disease) (06/18/2014), Cancer ([place]) (11/24/2012), Chicken pox, Chronic prostatitis (09/30/2012), Colon polyp, Congestive heart failure ([place]) (04/20/2014), Dizziness (12/22/2016), 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[place]) (08/30/2012), Eaton-Lambert syndrome ([place]), Elevated prostate specific antigen (PSA) (09/30/2012), Essential hypertension, benign, Gross hematuria (09/30/2012), Herpes zoster (11/28/2011), Hypercholesterolemia, Hypertension, benign (04/02/2011),  Hypotension, Moderate mitral insufficiency (05/18/2015), Moderate tricuspid insufficiency (05/18/2015), Myasthenia gravis ([place]), Myasthenia gravis without exacerbation ([place]) (03/31/2011), Shingles (2013), Squamous cell carcinoma of skin (11/22/2015), Squamous cell carcinoma of skin (11/03/2018), and Squamous cell carcinoma of skin (02/02/2019).   Surgical History:   Past Surgical History:  Procedure Laterality Date   [place]     Social History:   reports that he quit smoking about 63 years ago. His smoking use included cigarettes. He has never used smokeless tobacco. He reports current  alcohol use. He reports that he does not use drugs.   Family History:  His family history includes Arthritis in an other family member; Lung cancer in his brother and sister; Prostate cancer in his brother. There is no history of Colon cancer.   Allergies Allergies  Allergen Reactions   Beta Adrenergic Blockers Other (See Comments)    Beta Blocker will precipitate acute weakness in Lambert-Eaton Syndrome or Myasthenia Gravis   Calcium Channel Blockers Other (See Comments)    Calcium Channel Blocker- will precipitate acute weakness in Lambert-Eaton Syndrome.   Diltiazem Other (See Comments)   Ciprofloxacin Other (See Comments)    Last resort due to Myasthia Gravis   Sulfa Antibiotics Rash     Home Medications  Prior to Admission medications   Medication Sig Start Date End Date Taking? Authorizing Provider  Acetaminophen (ARTHRITIS PAIN PO) Take 650 mg by mouth.   Yes [provider]  aspirin 81 MG tablet Take 81 mg by mouth daily.   Yes [provider]  docusate sodium (COLACE) 100 MG capsule Take 1 capsule (100 mg total) by mouth 2 (two) times daily as needed for mild constipation. 06/18/22  Yes Emeterio Reeve, DO  hydrocortisone (ANUSOL-HC) 25 MG suppository Place 25 mg rectally 2 (two) times daily  as needed. 08/19/22  Yes [provider]  polyethylene glycol (MIRALAX / GLYCOLAX) 17 g packet Take 17 g by mouth daily as needed for moderate constipation. 06/18/22  Yes Emeterio Reeve, DO  tacrolimus (PROTOPIC) 0.1 % ointment Apply topically 2 (two) times daily. 07/29/21  Yes Ralene Bathe, MD  clopidogrel (PLAVIX) 75 MG tablet Take 1 tablet (75 mg total) by mouth daily. Total at least 6 mos therapy w/ clopidogrel + aspirin Patient not taking: Reported on 09/06/2022 06/19/22   Emeterio Reeve, DO  finasteride (PROSCAR) 5 MG tablet Take 5 mg by mouth daily.    [provider]  furosemide (LASIX) 40 MG tablet Take 1 tablet (40 mg total) by mouth daily. 04/03/20 08/04/22  Alisa Graff, FNP  hydroxypropyl methylcellulose / hypromellose (ISOPTO TEARS / GONIOVISC) 2.5 % ophthalmic solution Place 1 drop into both eyes daily. Patient not taking: Reported on 09/06/2022    [provider]  mycophenolate (CELLCEPT) 250 MG capsule Take 1,000 mg by mouth 2 (two) times daily.     [provider]  pravastatin (PRAVACHOL) 20 MG tablet Take 1 tablet (20 mg total) by mouth daily. 07/27/14   [name], MD  pyridostigmine (MESTINON) 60 MG tablet Take 30 mg by mouth 6 (six) times daily.     [provider]  tamsulosin (FLOMAX) 0.4 MG CAPS Take 0.4 mg by mouth daily.    [provider]     Critical care time: n/a    Roselie Awkward, MD [facility] Pager: [phone] Cell: [phone] After 7:00 pm call Elink  [phone]

## 2022-09-08 NOTE — Discharge Instructions (Signed)
Low Sodium Nutrition Therapy  Eating less sodium can help you if you have high blood pressure, heart failure, or kidney or liver disease.   Your body needs a little sodium, but too much sodium can cause your body to hold onto extra water. This extra water will raise your blood pressure and can cause damage to your heart, kidneys, or liver as they are forced to work harder.   Sometimes you can see how the extra fluid affects you because your hands, legs, or belly swell. You may also hold water around your heart and lungs, which makes it hard to breathe.   Even if you take medication for blood pressure or a water pill (diuretic) to remove fluid, it is still important to have less salt in your diet.   Check with your primary care provider before drinking alcohol since it may affect the amount of fluid in your body and how your heart, kidneys, or liver work. Sodium in Food A low-sodium meal plan limits the sodium that you get from food and beverages to 1,500-2,000 milligrams (mg) per day. Salt is the main source of sodium. Read the nutrition label on the package to find out how much sodium is in one serving of a food.  Select foods with 140 milligrams (mg) of sodium or less per serving.  You may be able to eat one or two servings of foods with a little more than 140 milligrams (mg) of sodium if you are closely watching how much sodium you eat in a day.  Check the serving size on the label. The amount of sodium listed on the label shows the amount in one serving of the food. So, if you eat more than one serving, you will get more sodium than the amount listed.  Tips Cutting Back on Sodium Eat more fresh foods.  Fresh fruits and vegetables are low in sodium, as well as frozen vegetables and fruits that have no added juices or sauces.  Fresh meats are lower in sodium than processed meats, such as bacon, sausage, and hotdogs.  Not all processed foods are unhealthy, but some processed foods may have too  much sodium.  Eat less salt at the table and when cooking. One of the ingredients in salt is sodium.  One teaspoon of table salt has 2,300 milligrams of sodium.  Leave the salt out of recipes for pasta, casseroles, and soups. Be a Engineer, building services.  Food packages that say "Salt-free", sodium-free", "very low sodium," and "low sodium" have less than 140 milligrams of sodium per serving.  Beware of products identified as "Unsalted," "No Salt Added," "Reduced Sodium," or "Lower Sodium." These items may still be high in sodium. You should always check the nutrition label. Add flavors to your food without adding sodium.  Try lemon juice, lime juice, or vinegar.  Dry or fresh herbs add flavor.  Buy a sodium-free seasoning blend or make your own at home. You can purchase salt-free or sodium-free condiments like barbeque sauce in stores and online. Ask your registered dietitian nutritionist for recommendations and where to find them.   Eating in Restaurants Choose foods carefully when you eat outside your home. Restaurant foods can be very high in sodium. Many restaurants provide nutrition facts on their menus or their websites. If you cannot find that information, ask your server. Let your server know that you want your food to be cooked without salt and that you would like your salad dressing and sauces to be served on the  side.    Foods Recommended Food Group Foods Recommended  Grains Bread, bagels, rolls without salted tops Homemade bread made with reduced-sodium baking powder Cold cereals, especially shredded wheat and puffed rice Oats, grits, or cream of wheat Pastas, quinoa, and rice Popcorn, pretzels or crackers without salt Corn tortillas  Protein Foods Fresh meats and fish; [place] bacon (check the nutrition labels - make sure they are not packaged in a sodium solution) Canned or packed tuna (no more than 4 ounces at 1 serving) Beans and peas Soybeans) and tofu Eggs Nuts or nut butters  without salt  Dairy Milk or milk powder Plant milks, such as rice and soy Yogurt, including Greek yogurt Small amounts of natural cheese (blocks of cheese) or reduced-sodium cheese can be used in moderation. (Swiss, ricotta, and fresh mozzarella cheese are lower in sodium than the others) Cream Cheese Low sodium cottage cheese  Vegetables Fresh and frozen vegetables without added sauces or salt Homemade soups (without salt) Low-sodium, salt-free or sodium-free canned vegetables and soups  Fruit Fresh and canned fruits Dried fruits, such as raisins, cranberries, and prunes  Oils Tub or liquid margarine, regular or without salt Canola, corn, peanut, olive, safflower, or sunflower oils  Condiments Fresh or dried herbs such as basil, bay leaf, dill, mustard (dry), nutmeg, paprika, parsley, rosemary, sage, or thyme.  Low sodium ketchup Vinegar  Lemon or lime juice Pepper, red pepper flakes, and cayenne. Hot sauce contains sodium, but if you use just a drop or two, it will not add up to much.  Salt-free or sodium-free seasoning mixes and marinades Simple salad dressings: vinegar and oil   Foods Not Recommended Food Group Foods Not Recommended  Grains Breads or crackers topped with salt Cereals (hot/cold) with more than 300 mg sodium per serving Biscuits, cornbread, and other "quick" breads prepared with baking soda Pre-packaged bread crumbs Seasoned and packaged rice and pasta mixes Self-rising flours  Protein Foods Cured meats: Bacon, ham, sausage, pepperoni and hot dogs Canned meats (chili, vienna sausage, or sardines) Smoked fish and meats Frozen meals that have more than 600 mg of sodium per serving Egg substitute (with added sodium)  Dairy Buttermilk Processed cheese spreads Cottage cheese (1 cup may have over 500 mg of sodium; look for low-sodium.) American or feta cheese Shredded Cheese has more sodium than blocks of cheese String cheese  Vegetables Canned vegetables  (unless they are salt-free, sodium-free or low sodium) Frozen vegetables with seasoning and sauces Sauerkraut and pickled vegetables Canned or dried soups (unless they are salt-free, sodium-free, or low sodium) [place] fries and onion rings  Fruit Dried fruits preserved with additives that have sodium  Oils Salted butter or margarine, all types of olives  Condiments Salt, sea salt, kosher salt, onion salt, and garlic salt Seasoning mixes with salt Bouillon cubes Ketchup Barbeque sauce and Worcestershire sauce unless low sodium Soy sauce Salsa, pickles, olives, relish Salad dressings: ranch, blue cheese, [place], and [place].   Low Sodium Sample 1-Day Menu  Breakfast 1 cup cooked oatmeal  1 slice whole wheat bread toast  1 tablespoon peanut butter without salt  1 banana  1 cup 1% milk  Lunch Tacos made with: 2 corn tortillas   cup black beans, low sodium   cup roasted or grilled chicken (without skin)   avocado  Squeeze of lime juice  1 cup salad greens  1 tablespoon low-sodium salad dressing   cup strawberries  1 orange  Afternoon Snack 1/3 cup grapes  6 ounces yogurt  Evening Meal 3 ounces herb-baked fish  1 baked potato  2 teaspoons olive oil   cup cooked carrots  2 thick slices tomatoes on:  2 lettuce leaves  1 teaspoon olive oil  1 teaspoon balsamic vinegar  1 cup 1% milk  Evening Snack 1 apple   cup almonds without salt   Low-Sodium Vegetarian (Lacto-Ovo) Sample 1-Day Menu  Breakfast 1 cup cooked oatmeal  1 slice whole wheat toast  1 tablespoon peanut butter without salt  1 banana  1 cup 1% milk  Lunch Tacos made with: 2 corn tortillas   cup black beans, low sodium   cup roasted or grilled chicken (without skin)   avocado  Squeeze of lime juice  1 cup salad greens  1 tablespoon low-sodium salad dressing   cup strawberries  1 orange  Evening Meal Stir fry made with:  cup tofu  1 cup brown rice   cup broccoli   cup green beans   cup  peppers   tablespoon peanut oil  1 orange  1 cup 1% milk  Evening Snack 4 strips celery  2 tablespoons hummus  1 hard-boiled egg   Low-Sodium Vegan Sample 1-Day Menu  Breakfast 1 cup cooked oatmeal  1 tablespoon peanut butter without salt  1 cup blueberries  1 cup soymilk fortified with calcium, vitamin B12, and vitamin D  Lunch 1 small whole wheat pita   cup cooked lentils  2 tablespoons hummus  4 carrot sticks  1 medium apple  1 cup soymilk fortified with calcium, vitamin B12, and vitamin D  Evening Meal Stir fry made with:  cup tofu  1 cup brown rice   cup broccoli   cup green beans   cup peppers   tablespoon peanut oil  1 cup cantaloupe  Evening Snack 1 cup soy yogurt   cup mixed nuts  Copyright 2020  Academy of Nutrition and Dietetics. All rights reserved  Sodium Free Flavoring Tips  When cooking, the following items may be used for flavoring instead of salt or seasonings that contain sodium. Remember: A little bit of spice goes a long way! Be careful not to overseason. Spice Blend Recipe (makes about ? cup) 5 teaspoons onion powder  2 teaspoons garlic powder  2 teaspoons paprika  2 teaspoon dry mustard  1 teaspoon crushed thyme leaves   teaspoon white pepper   teaspoon celery seed Food Item Flavorings  Beef Basil, bay leaf, caraway, curry, dill, dry mustard, garlic, grape jelly, green pepper, mace, marjoram, mushrooms (fresh), nutmeg, onion or onion powder, parsley, pepper, rosemary, sage  Chicken Basil, cloves, cranberries, mace, mushrooms (fresh), nutmeg, oregano, paprika, parsley, pineapple, saffron, sage, savory, tarragon, thyme, tomato, turmeric  Egg Chervil, curry, dill, dry mustard, garlic or garlic powder, green pepper, jelly, mushrooms (fresh), nutmeg, onion powder, paprika, parsley, rosemary, tarragon, tomato  Fish Basil, bay leaf, chervil, curry, dill, dry mustard, green pepper, lemon juice, marjoram, mushrooms (fresh), paprika, pepper,  tarragon, tomato, turmeric  Lamb Cloves, curry, dill, garlic or garlic powder, mace, mint, mint jelly, onion, oregano, parsley, pineapple, rosemary, tarragon, thyme  Pork Applesauce, basil, caraway, chives, cloves, garlic or garlic powder, onion or onion powder, rosemary, thyme  Veal Apricots, basil, bay leaf, currant jelly, curry, ginger, marjoram, mushrooms (fresh), oregano, paprika  Vegetables Basil, dill, garlic or garlic powder, ginger, lemon juice, mace, marjoram, nutmeg, onion or onion powder, tarragon, tomato, sugar or sugar substitute, salt-free salad dressing, vinegar  Desserts Allspice, anise, cinnamon, cloves, ginger, mace, nutmeg, vanilla extract, other  extracts   Copyright 2020  Academy of Nutrition and Dietetics. All rights reserved  Fluid Restricted Nutrition Therapy  You have been prescribed this diet because your condition affects how much fluid you can eat or drink. If your heart, liver, or kidneys aren't working properly, you may not be able to effectively eliminate fluids from the body and this may cause swelling (edema) in the legs, arms, and/or stomach. Drink no more than _________ liters or ________ ounces or ________cups of fluid per day.  You don't need to stop eating or drinking the same fluids you normally would, but you may need to eat or drink less than usual.  Your registered dietitian nutritionist will help you determine the correct amount of fluid to consume during the day Breakfast Include fluids taken with medications  Lunch Include fluids taken with medications  Dinner Include fluids taken with medications  Bedtime Snack Include fluids taken with medications     Tips What Are Fluids?  A fluid is anything that is liquid or anything that would melt if left at room temperature. You will need to count these foods and liquids--including any liquid used to take medication--as part of your daily fluid intake. Some examples are: Alcohol (drink only with your  doctor's permission)  Coffee, tea, and other hot beverages  Gelatin (Jell-O)  Gravy  Ice cream, sherbet, sorbet  Ice cubes, ice chips  Milk, liquid creamer  Nutritional supplements  Popsicles  Vegetable and fruit juices; fluid in canned fruit  Watermelon  Yogurt  Soft drinks, lemonade, limeade  Soups  Syrup How Do I Measure My Fluid Intake? Record your fluid intake daily.  Tip: Every day, each time you eat or drink fluids, pour water in the same amount into an empty container that can hold the same amount of fluids you are allowed daily. This may help you keep track of how much fluid you are taking in throughout the day.  To accurately keep track of how much liquid you take in, measure the size of the cups, glasses, and bowls you use. If you eat soup, measure how much of it is liquid and how much is solid (such as noodles, vegetables, meat). Conversions for Measuring Fluid Intake  Milliliters (mL) Liters (L) Ounces (oz) Cups (c)  1000 1 32 4  1200 1.2 40 5  1500 1.5 50 6 1/4  1800 1.8 60 7 1/2  2000 2 67 8 1/3  Tips to Reduce Your Thirst Chew gum or suck on hard candy.  Rinse or gargle with mouthwash. Do not swallow.  Ice chips or popsicles my help quench thirst, but this too needs to be calculated into the total restriction. Melt ice chips or cubes first to figure out how much fluid they produce (for example, experiment with melting  cup ice chips or 2 ice cubes).  Add a lemon wedge to your water.  Limit how much salt you take in. A high salt intake might make you thirstier.  Don't eat or drink all your allowed liquids at once. Space your liquids out through the day.  Use small glasses and cups and sip slowly. If allowed, take your medications with fluids you eat or drink during a meal.   Fluid-Restricted Nutrition Therapy Sample 1-Day Menu  Breakfast 1 slice wheat toast  1 tablespoon peanut butter  1/2 cup yogurt (120 milliliters)  1/2 cup blueberries  1 cup milk (240  milliliters)   Lunch 3 ounces sliced [place]  2 slices whole wheat  bread  1/2 cup lettuce for sandwich  2 slices tomato for sandwich  1 ounce reduced-fat, reduced-sodium cheese  1/2 cup fresh carrot sticks  1 banana  1 cup unsweetened tea (240 milliliters)   Evening Meal 8 ounces soup (240 milliliters)  3 ounces salmon  1/2 cup quinoa  1 cup green beans  1 cup mixed greens salad  1 tablespoon olive oil  1 cup coffee (240 milliliters)  Evening Snack 1/2 cup sliced peaches  1/2 cup frozen yogurt (120 milliliters)  1 cup water (240 milliliters)  Copyright 2020  Academy of Nutrition and Dietetics. All rights reserved    Drugs to avoid if you have Myasthenia Gravis:   Many drugs have been reported to have adverse effects in patients with MG (see below). However, not all patients react adversely to all these drugs. Conversely, not all "safe" drugs can be used with impunity in patients with MG.As a rule, the listed drugs should be avoided whenever possible, and patients with MG should be followed closely when any new drug is introduced.  Drugs that may exacerbate MG  Antibiotics  Aminoglycosides: e.g., streptomycin, tobramycin, kanamycin  Quinolones: e.g., ciprofloxacin, levofloxacin, ofloxacin, gatifloxacin  Macrolides: e.g., erythromycin, azithromycin, telithromycin  Nondepolarizing muscle relaxants for surgery  d-Tubocurarine (curare), pancuronium, vecuronium, atracurium  Beta-blocking agents  Propranolol, atenolol, metoprolol  Local anesthetics and related agents  Procaine, Xylocaine in large amounts  Procainamide (for arrhythmias)  Botulinum toxin  Botox exacerbates weakness.  Quinine derivatives  Quinine, quinidine, chloroquine, mefloquine (Lariam)  Magnesium  Decreases ACh release  Penicillamine  May cause MG  Drugs with important interactions in MG  Cyclosporine  Broad range of drug interactions, which may raise or lower cyclosporine levels  Azathioprine  Avoid  allopurinol; combination may result in myelosuppression.

## 2022-09-08 NOTE — Progress Notes (Signed)
Called to pt's room for pt "not feeling right". Upon entering pt restless and says, "I just don't feel right. I'm breathing fine. I'm not worried about anything, I just feel like a dog." Immediately notified Dr. Dwyane Dee and charge nurse who came right in. Vitals obtained and were consistent with how he's been all day. NIF obtained by RT and it was actually better than this morning. Pt stable but still feeling not himself. Will continue to monitor the pt closely and keep the charge nurse and physician in the loop.

## 2022-09-08 NOTE — Progress Notes (Signed)
NIF did with patient, Attempted times three with -16cm H2O being the best of the three.

## 2022-09-08 NOTE — Progress Notes (Signed)
Nif done with patient per MD request. Attempted times 2 with the best being -18cm H2O. Dr. Dwyane Dee notified.

## 2022-09-08 NOTE — Progress Notes (Signed)
Called to pt's room by family member due to pt not being able to tolerate the CPAP.  Pt  anxious and requesting to go back on nasal cannula.  This Rn placed pt back no 3L [place] and informed respiratory therapist.  All VS stable currently, will continue to monitor.

## 2022-09-08 NOTE — TOC Initial Note (Signed)
Transition of Care [facility]) - Initial/Assessment Note    Patient Details  Name: [name] MRN: [id number] Date of Birth: 1925/11/18  [facility]) CM/SW Contact:    Tiburcio Bash, LCSW Phone Number: 09/08/2022, 9:08 AM  Clinical Narrative:                  Patient with recent admission risk assessment.   Patient is from home, at baseline was provided with home O2 but hardly wears it.   Home O2 is  through [employer].   Last admission last month, patient was interested in exploring ability to get O2 free of charge through the [facility]   PCP Dr. Suzi Roots.   CSW had reached out to [facility] at [phone].  Ext: [phone] All clinicals and orders were faxed to [phone]  They had made follow up calls with daughter.   TOC Will continue to follow for discharge planning needs.    Expected Discharge Plan: [place] Barriers to Discharge: Continued Medical Work up   Patient Goals and CMS Choice Patient states their goals for this hospitalization and ongoing recovery are:: to go home CMS Medicare.gov Compare Post Acute Care list provided to:: Patient Represenative (must comment) (daughter [name])    Expected Discharge Plan and Services Expected Discharge Plan: [place]                                              Prior Living Arrangements/Services                       Activities of Daily Living Home Assistive Devices/Equipment: Cane (specify quad or straight), Walker (specify type) ADL Screening (condition at time of admission) Patient's cognitive ability adequate to safely complete daily activities?: Yes Is the patient deaf or have difficulty hearing?: No Does the patient have difficulty seeing, even when wearing glasses/contacts?: No Does the patient have difficulty concentrating, remembering, or making decisions?: No Patient able to express need for assistance with ADLs?: Yes Does the patient have  difficulty dressing or bathing?: No Independently performs ADLs?: No Communication: Independent Dressing (OT): Needs assistance Is this a change from baseline?: Pre-admission baseline Grooming: Independent Feeding: Independent Bathing: Needs assistance Is this a change from baseline?: Pre-admission baseline Toileting: Independent with device (comment) In/Out Bed: Independent with device (comment) Walks in Home: Independent with device (comment) Does the patient have difficulty walking or climbing stairs?: Yes Weakness of Legs: Both Weakness of Arms/Hands: None  Permission Sought/Granted                  Emotional Assessment              Admission diagnosis:  Shortness of breath [R06.02] CHF exacerbation (HCC) [I50.9] Pleural effusion, bilateral [J90] Acute on chronic congestive heart failure, unspecified heart failure type ([place]) [I50.9] Patient Active Problem List   Diagnosis Date Noted   CHF exacerbation ([place]) 09/06/2022   Elevated troponin 09/06/2022   AKI (acute kidney injury) ([place]) 09/06/2022   Dyslipidemia 08/05/2022   Acute respiratory failure with hypoxia ([place]) 08/05/2022   Acute on chronic systolic CHF (congestive heart failure) ([place]) 08/04/2022   NSTEMI (non-ST elevated myocardial infarction) ([place])    Acute on chronic respiratory failure with hypoxia and hypercapnia ([place]) 06/14/2022   Stasis dermatitis 05/24/2022   Acute  on chronic combined systolic and diastolic CHF (congestive heart failure) ([place]) 09/28/2021   Benign prostatic hyperplasia 09/28/2021   Rectal bleeding 02/24/2021   Rectal irritation 11/25/2020   Lymphedema 11/19/2020   Pressure injury of right buttock, stage 2 ([place]) 04/23/2020   Lower limb ulcer, calf, left, limited to breakdown of skin ([place]) 12/27/2019   Dysuria 06/30/2019   Hyperglycemia 06/30/2019   LVH (left ventricular hypertrophy) due to hypertensive disease, without heart failure 05/26/2019   Pseudophakia, left eye 11/26/2018    Venous insufficiency of both lower extremities 10/13/2018   Swelling of limb 10/12/2018   Bilateral lower extremity edema 10/10/2018   Absent pulse in lower extremity 10/10/2018   Corneal thinning of right eye 07/17/2018   Eye pain 05/31/2018   Mild aortic valve stenosis 12/03/2017   Epididymoorchitis 09/02/2017   Hydrocele, right 08/11/2017   Aortic aneurysm ([place]) 03/28/2017   Abnormal chest CT 03/28/2017   Acute on chronic hypoxic respiratory failure ([place]) 12/27/2016   Hypotension    Dizziness 12/22/2016   Leg weakness 03/11/2016   Skin lesion 09/11/2015   Moderate mitral insufficiency 05/18/2015   Moderate tricuspid insufficiency 05/18/2015   Essential hypertension 04/25/2015   Rash 03/10/2015   Health care maintenance 03/10/2015   Bilateral carotid artery stenosis 08/30/2014   Atherosclerosis of both carotid arteries 44/11/4740   Chronic systolic CHF (congestive heart failure), NYHA class 3 ([place]) 07/17/2014   Coronary artery disease involving native coronary artery of native heart without angina pectoris 06/18/2014   CHF (congestive heart failure) ([place]) 04/20/2014   Congestive heart failure ([place]) 04/20/2014   SOB (shortness of breath) 03/10/2014   Abnormal EKG 03/10/2014   Mixed hyperlipidemia 03/10/2014   Benign lipomatous neoplasm of skin and subcutaneous tissue of left arm 10/04/2013   Arm skin lesion, left 09/04/2013   History of shingles 09/04/2013   History of bladder cancer 03/05/2013   Chronic prostatitis 09/30/2012   Elevated prostate specific antigen (PSA) 09/30/2012   Family history of malignant neoplasm of prostate 09/30/2012   Gross hematuria 09/30/2012   Incomplete emptying of bladder 09/30/2012   Anemia 08/30/2012   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

## 2022-09-09 ENCOUNTER — Encounter: Payer: Medicare Other | Admitting: Occupational Therapy

## 2022-09-09 ENCOUNTER — Inpatient Hospital Stay: Payer: Medicare Other

## 2022-09-09 DIAGNOSIS — I5023 Acute on chronic systolic (congestive) heart failure: Secondary | ICD-10-CM | POA: Diagnosis not present

## 2022-09-09 LAB — BASIC METABOLIC PANEL
Anion gap: 6 (ref 5–15)
BUN: 51 mg/dL — ABNORMAL HIGH (ref 8–23)
CO2: 37 mmol/L — ABNORMAL HIGH (ref 22–32)
Calcium: 8.4 mg/dL — ABNORMAL LOW (ref 8.9–10.3)
Chloride: 98 mmol/L (ref 98–111)
Creatinine, Ser: 1.15 mg/dL (ref 0.61–1.24)
GFR, Estimated: 59 mL/min — ABNORMAL LOW (ref >60)
Glucose, Bld: 123 mg/dL — ABNORMAL HIGH (ref 70–99)
Potassium: 3.5 mmol/L (ref 3.5–5.1)
Sodium: 141 mmol/L (ref 135–145)

## 2022-09-09 LAB — CBC
HCT: 37 % — ABNORMAL LOW (ref 39.0–52.0)
Hemoglobin: 11.1 g/dL — ABNORMAL LOW (ref 13.0–17.0)
MCH: 29.1 pg (ref 26.0–34.0)
MCHC: 30 g/dL (ref 30.0–36.0)
MCV: 96.9 fL (ref 80.0–100.0)
Platelets: 152 10*3/uL (ref 150–400)
RBC: 3.82 MIL/uL — ABNORMAL LOW (ref 4.22–5.81)
RDW: 15.9 % — ABNORMAL HIGH (ref 11.5–15.5)
WBC: 5.5 10*3/uL (ref 4.0–10.5)
nRBC: 0 % (ref 0.0–0.2)

## 2022-09-09 LAB — GLUCOSE, CAPILLARY
Glucose-Capillary: 175 mg/dL — ABNORMAL HIGH (ref 70–99)
Glucose-Capillary: 127 mg/dL — ABNORMAL HIGH (ref 70–99)

## 2022-09-09 LAB — MAGNESIUM: Magnesium: 2.3 mg/dL (ref 1.7–2.4)

## 2022-09-09 LAB — PHOSPHORUS: Phosphorus: 3.6 mg/dL (ref 2.5–4.6)

## 2022-09-09 MED ORDER — MELATONIN 5 MG PO TABS
5.0000 mg | ORAL_TABLET | Freq: Every evening | ORAL | Status: AC | PRN
  Administered 2022-09-09 – 2022-09-10 (×2): 5 mg via ORAL
  Filled 2022-09-09 (×2): qty 1

## 2022-09-09 MED ORDER — ORAL CARE MOUTH RINSE: 15.0000 mL | OROMUCOSAL | Status: DC | PRN

## 2022-09-09 NOTE — Progress Notes (Signed)
Went in to check on patient and patient was visibly distressed, shaking, stating "he was burning up" all vitals WNL, no fever, lungs were clear but patient was taking short shallow breaths, only other symptom he would give me was that he felt like he wasn't moving enough air. I got RT to give prn neb, gave tylenol for possible pain (CPOT score) even though he was stating he wasn't in pain, he seemed very restless. PT refused to go back on bipap. After a little bit pt stated he felt better, he looked more relaxed (not shaking).  I notified MD, no new orders given, will continue to monitor. About an hour later pt had same symptoms, all vitals WNL, no fever, gave scheduled lasix. Will continue to monitor.

## 2022-09-09 NOTE — Consult Note (Addendum)
Neurology Consultation Reason for Consult: Respiratory concerns, concern for MG/LEMS exacerbation  Requesting Physician: Roselie Awkward   CC: Difficulty breathing  History is obtained from: Chart review, family at bedside and patient   HPI: [name] is a 88 y.o. 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    He was subsequently admitted on 9/16 for acute respiratory failure and found to have an NSTEMI started on BiPAP and improving with diuresis but discharged on 9/20 with home oxygenation due to desaturation with ambulation.    Eventually he was needing oxygen only at night at home but he was readmitted on 11/6 through 11/9 again for heart failure exacerbation with bilateral pleural effusions, left greater than right.  Since the hospitalization he has been continuously on oxygen at home and has had overall declining functional status due to being tied to oxygen.    He was again admitted here 09/06/2022 found to have now worsening pleural effusions right and left.  Pulmonology was consulted and due to some complaint of worsening double vision as well neurology was consulted for question of myasthenic flare or LEMS flare contributing to his symptoms, in the setting of NIF of -18.  There is additionally some complexity in his goals of care discussions as documented  in critical care medicine's note.  He was started on BiPAP overnight for chronic hypercarbia but he has been unable to tolerate the mask and is currently on nasal cannula with; family is inquiring about the possibility of nasal pillows for him rather than a full facemask  He additionally has had an acute kidney injury with creatinine of 1.49 from his baseline of 0.93 and in the setting was having some myoclonic jerking on admission which is improving  Excellent summary of his MG/LEMS history, confirmed with family at bedside and updated as indicated Diagnosed: in 1979 with LES and with MG in 2003 Initial symptoms: double vision, dysphagia and frequent falls Current symptoms:mild ptosis on the R, mild problems with swallowing (meat) and generalized weakness Immunology studies initial and the recent one from: The serologies from e-records are only available for AChR and are as bellow: from 2000/2003: Blocking 8 (N)/49(H), Binding 1.19 (H)/22.9 (H) Modulation 98 (H)/100(H) [More recent not also states P/Q VGCC 0.43 in 2014] SF-EMG initial and the most recent one: initial one form 07/29/78 (ODM) 24-35% decrement on 1-3-5 Hz stim and 135% postactivation facilitation. The most recent one form 06/09/13, which was significantly worse compare to the one from 2003... Thymectomy: N/A Medication trials: Initially mestinon only, 2006- cellcept, no tx w/PLEX or IVIG Exacerbations: none, intubated in 1970s when initially diagno hypertension, right eye herpes zoster, sed with MG Also intubated 12/27/2016 in the setting of heart failure exacerbation; family denies other intubations Hospital admissions w/exacerbations:  Current MG medications: cellcept 1000 mg BID and mestinon 30 mg every 6 hours. In 2014, he had to discontinue his mycophenolate mofetil transiently while receiving BCG for  treatment of bladder CA. Currently is back to Cellcept 1000 m Other related interventions: bilateral blepheroplasty ( per pt helped  with his ptosis) Last B12: f/u with PCP Lab Results  Component Value Date   VITAMINB12 449 09/08/2022  Last TSH: f/u with PCP Lab Results  Component Value Date   TSH 1.543 09/29/2021   Other pertinent history: - shingles of the R eye ~2012 and continuous discomfort in this eye and blurry vision due a residual scar [reports difficult to assess double vision due to very poor vision in this eye] - Bladder CA Dx in early 2014, s/p transurethral resection of bladder tumor and 6 BCG installations - CAD, MI, CHF with EF 29-35%   In review of the chart he has had NIF of -18 to -29 in the past  ROS: All other review of systems was negative except as noted in the HPI.   Past Medical History:  Diagnosis Date   Abnormal chest CT 03/28/2017   Allergy    Anemia    Angina pectoris ([place]) 04/02/2011   Aortic aneurysm ([place]) 03/28/2017   Saw Dr Genevive Bi 04/16/17 - recommended f/u CT in 3 months.     Atherosclerosis of both carotid arteries 07/17/2014   Basal cell carcinoma 06/25/2021   Left ant neck. Nodulocystic pattern, close to margin   Benign lipomatous neoplasm of skin and subcutaneous tissue of left arm 10/04/2013   Bilateral carotid artery stenosis 08/30/2014   Overview:  Less than 50% 2015   Bladder cancer ([place]) 03/05/2013   CAD (coronary artery disease) 06/18/2014   Cancer ([place]) 11/24/2012   bladder. BCG treatments by Dr Jacqlyn Larsen.   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 WD SCC. Re-shaved 01/14/2016. Excised 02/05/2016, margins free.   Squamous cell carcinoma of skin 11/03/2018   Right cheek ant. to sideburn. Poorly differentiated.   Squamous cell carcinoma of skin 02/02/2019   Right mid dorsum forearm. WD SCC. EDC.   Past Surgical History:  Procedure Laterality Date   [place]   Current Outpatient Medications  Medication Instructions   Acetaminophen (ARTHRITIS PAIN PO) 650 mg, Oral   aspirin 81 mg, Oral, Daily   clopidogrel (PLAVIX) 75 mg, Oral, Daily, Total at least 6 mos therapy w/ clopidogrel + aspirin   docusate sodium (COLACE) 100 mg, Oral, 2 times daily PRN   finasteride (PROSCAR) 5 mg, Oral, Daily   furosemide (LASIX) 40 mg, Oral, Daily   hydrocortisone (ANUSOL-HC) 25 mg, Rectal, 2 times daily PRN   hydroxypropyl methylcellulose / hypromellose (ISOPTO TEARS / GONIOVISC) 2.5 % ophthalmic solution 1 drop, Daily   mycophenolate (CELLCEPT) 1,000 mg, Oral,  2 times daily   polyethylene glycol (MIRALAX / GLYCOLAX) 17 g, Oral, Daily PRN   pravastatin (PRAVACHOL) 20 mg, Oral, Daily   pyridostigmine (MESTINON) 30 mg, Oral, 6 times daily   tacrolimus (PROTOPIC) 0.1 % ointment Topical, 2 times daily   tamsulosin (FLOMAX) 0.4 mg, Oral, Daily     Family History  Problem Relation Age of Onset   Lung cancer Sister    Prostate cancer Brother    Lung cancer Brother    Arthritis Other        parent   Colon cancer Neg Hx    Social History:  reports that he quit smoking about 63 years ago. His smoking use included cigarettes. He has never used smokeless tobacco. He reports current alcohol use. He reports that he does not use  drugs.  Exam: Current vital signs: BP (!) 110/59 (BP Location: Left Arm)   Pulse (!) 53   Temp 97.8 F (36.6 C) (Oral)   Resp (!) 29   Ht '5\' 10"'$  (1.778 m)   Wt 86.7 kg   SpO2 98%   BMI 27.43 kg/m  Vital signs in last 24 hours: Temp:  [97.3 F (36.3 C)-98.2 F (36.8 C)] 97.8 F (36.6 C) (12/12 0800) Pulse Rate:  [30-64] 53 (12/12 0900) Resp:  [13-31] 29 (12/12 0900) BP: (89-132)/(48-75) 110/59 (12/12 0900) SpO2:  [91 %-98 %] 98 % (12/12 0900) Weight:  [86.7 kg-89 kg] 86.7 kg (12/12 0500)   Physical Exam  Constitutional: Appears chronically ill, in bed with head of bed elevated Psych: Affect calm and cooperative Eyes: No scleral injection HENT: Nasal cannula in place MSK: no joint deformities.  Cardiovascular: Perfusing extremities well Respiratory: Somewhat short of breath.  Single breath test of 14 GI: Soft.  No distension. There is no tenderness.  Skin: Warm dry and intact visible skin  Neuro: Mental Status: Patient is awake, alert, oriented to person, place, month, year, and situation. Patient is able to give a clear and coherent history. No signs of aphasia or neglect Cranial Nerves: II: Visual Fields are full. Pupils are equal, round, and reactive to light.   III,IV, VI: EOMI without new ptosis or diploplia, but he does have significant scarring of the right cornea due to herpes zoster which limits evaluation for diplopia.  V: Facial sensation is symmetric to light touch VII: Facial movement is symmetric.  VIII: hearing is intact to voice X: Uvula elevates symmetrically XI: Shoulder shrug is symmetric. XII: tongue is midline without atrophy or fasciculations.  Motor: Tone is normal. Bulk is normal. 5/5 strength including 5/5 neck flexion and head turn, finger extension is 4/5, hip flexion is 3/5.  Arm abduction is 5/5 tested 10 times in a row without fatiguing Sensory: Sensation is symmetric to light touch in the arms and legs.  At baseline has some  significant sensory loss predominantly distally Deep Tendon Reflexes: At baseline he is documented to be areflexic throughout; absent brachioradialis and patellar reflexes for me Cerebellar: He does have some dysmetria on the right greater than left which is stable from prior examination Gait:  Not tested given respiratory status   I have reviewed labs in epic and the results pertinent to this consultation are:  Basic Metabolic Panel: Recent Labs  Lab 09/06/22 0433 09/06/22 1410 09/07/22 0630 09/08/22 0607 09/09/22 0502  NA 140 140 141 141 141  K 4.3 4.2 3.5 3.7 3.5  CL 99 99 100 97* 98  CO2 34* 34* 33* 34* 37*  GLUCOSE 136*  134* 119* 104* 123*  BUN 52* 50* 54* 54* 51*  CREATININE 1.39* 1.40* 1.23 1.26* 1.15  CALCIUM 8.9 8.8* 8.7* 8.7* 8.4*  MG  --  2.3 2.4 2.4 2.3  PHOS  --  3.6 3.7 3.4 3.6    CBC: Recent Labs  Lab 09/05/22 2259 09/06/22 0433 09/07/22 0630 09/08/22 0607 09/09/22 0502  WBC 10.1 10.2 6.1 5.1 5.5  HGB 11.2* 11.1* 10.8* 11.0* 11.1*  HCT 37.5* 38.0* 36.7* 36.7* 37.0*  MCV 96.2 97.4 95.6 94.8 96.9  PLT 243 225 188 184 152    Coagulation Studies: No results for input(s): "LABPROT", "INR" in the last 72 hours.    Latest Reference Range & Units 09/29/21 13:24 06/14/22 16:06 06/14/22 16:45 06/14/22 19:30 06/15/22 14:19 08/04/22 20:35 09/08/22 15:04 09/08/22 16:39  pH, Arterial 7.35 - 7.45  7.37   7.45    7.44  pCO2 arterial 32 - 48 mmHg 59 (H)   52 (H)    57 (H)  pO2, Arterial 83 - 108 mmHg 133 (H)   125 (H)    74 (L)  pH, Ven 7.25 - 7.43   7.2 (L) (C) 7.26  7.42 7.33 7.42   pCO2, Ven 44 - 60 mmHg  88 (HH) (C) 89 (HH)  62 (H) 67 (H) 61 (H)   pO2, Ven 32 - 45 mmHg  83 (H) (C) 36  55 (H) 61 (H) 72 (H)   Acid-Base Excess 0.0 - 2.0 mmol/L 6.9 (H) 3.4 (H) (C) 9.1 (H) 10.2 (H) 12.9 (H) 7.0 (H) 12.4 (H) 12.1 (H)  Bicarbonate 20.0 - 28.0 mmol/L 34.1 (H) 34.4 (H) (C) 39.9 (H) 36.1 (H) 40.2 (H) 35.3 (H) 39.6 (H) 38.7 (H)  O2 Saturation % 98.9 96.1 (C) 51.4 99.7  89.5 91.2 97.3 97.7  Patient temperature  37.0 37.0 (C) 37.0 37.0 37.0 37.0 37.0 37.0  Collection site  RIGHT RADIAL   RIGHT RADIAL VEIN VEIN VEIN RIGHT RADIAL  Allens test (pass/fail) PASS  PASS   PASS    PASS  [facility]): Data is critically high (H): Data is abnormally high (L): Data is abnormally low (C): Corrected  I have reviewed the images obtained:  CXR 09/08/2022 personally reviewed, agree with radiology:   Stable cardiomegaly with central pulmonary vascular congestion. Stable bilateral pleural effusions with associated bibasilar atelectasis or pneumonia.   Impression: Discussed with patient, family and critical care medicine/pulmonology.  On examination I do not find features concerning for acute myasthenic exacerbation and patient does report he feels that his respiratory status is improving.  His blood gas findings are consistent with a chronic hypercarbia; hypercarbia is a late finding and myasthenia associated respiratory failure and therefore this also fits with his breathing difficulties being more cardiopulmonary in nature.  Agree with pulmonology that his respiratory status is more likely related to his pleural effusions and deconditioning.  Do not feel that the risks of IVIG at this time are outweighed by the benefits.  While a negative inspiratory force less than 20 and myasthenic patients is typically indication for intubation, given he has been stable at this time and that his respiratory status seems to be more driven by medical comorbidities and then myasthenic flare, I agree with pulmonary team that it is reasonable to hold off on intubation at this time, especially given ongoing goals of care discussions and general decline in his health in the last few months  Recommendations: -Hold off on IVIG at this time -Will discuss with respiratory therapy optimization of NIF/FVC  evaluation for this patient -Appreciate pulmonary team plan for thoracentesis -Appreciate palliative care  team assistance with goals of care discussions  Lesleigh Noe MD-PhD [facility] [phone]  Greater than 60 minutes were spent in case of this patient today, the majority of which were at bedside

## 2022-09-09 NOTE — Progress Notes (Signed)
Pts son and I spoke to patient about his wishes. And his desire on what he wants his code status to be. He said he was tired and can't keep going on like this. Pt requested to be a DNR/DNI, per son-this has taken place before and the daughter who is poa steps in and says pt doesn't understand and changes it to full code status. I advised pt and his son that pt is competent in making his own decisions and lives alone. I advised pt and son that I would let hospitalist know and son requested that Dr. [place] Bells call and advise pts daughter. Dr. Reesa Chew would not confirm today she stated she wanted palliative to handle. I saw Albina Billet from palliative on the unit and explained to her the situation, she understood and will consult pt tomorrow, I advised pt and his son, they both understood and thanked me.

## 2022-09-09 NOTE — Progress Notes (Signed)
[facility] Progress Note  Patient: [name]    KDX:[id number]  DOA: 09/06/2022     Date of Service: the patient was seen and examined on 09/09/2022  Chief Complaint  Patient presents with   Shortness of Breath   Brief hospital course:  [name] is a 88 y.o. male with medical history significant for CAD, HTN, BPH, Lambert-Eaton myasthenic syndrome, systolic CHF (EF 25 to 97% 05/2022) on home O2 at 2L, admitted twice in the past 3 months for CHF exacerbation and respiratory failure requiring BiPAP, most recently from 11/6 to 11/9 who presents to the ED with shortness of breath, orthopnea and lower extremity edema similar to recent prior exacerbations. In the past week he had to go up on hte oxygen to 3.5 from 2L. On his admission in September he was also diagnosed with NSTEMI.  At the present time patient denies chest pain, cough, fever or chills.  Denies leg pain.  On arrival of EMS patient had increased work of breathing and was initially placed on NRB at 15 L ED course and data review: BP 107/74 with pulse 62.  Afebrile with O2 sat of 100% on 4 L.  Troponin 121-130 with BNP 2080.Marland Kitchen  CBC significant for hemoglobin 11.2, down from 12.4.Creatinine 1.49 up from 0.93 about 4 weeks prior EKG, personally viewed and interpreted showing NSR at 68 with LVH and nonischemic ST-T wave changes chest x-ray showed CHF and possible pneumonia as follows: IMPRESSION: Cardiomegaly with vascular congestion, pulmonary edema and small to moderate bilateral effusions. Basilar airspace disease may be due to atelectasis or pneumonia.   12/12: Initially admitted for acute on chronic HFrEF.  Worsening bilateral pleural effusion.  Critical care on board, his home Plavix was placed on hold and most likely will get his thoracentesis later this week. Neurology was also consulted to rule out myasthenia crisis due to worsening NIF which is now improving.  Less likely a myasthenia crisis and no IVIG needed at  this time.  See neurology note in detail. Currently seems stable at his baseline oxygen requirement of 2 to 3 L.  Unable to tolerate BiPAP mask, pulmonology is recommending using nasal prongs to continue BiPAP at night as it is improving NIF.  Patient is very high risk for deterioration and mortality based on age and underlying significant comorbidities.  Currently full code.  Palliative care was also consulted to discuss goals of care.   Assessment and Plan:  # Acute on chronic systolic CHF (congestive heart failure) (HCC) Bilateral pleural effusion Acute on chronic respiratory failure with hypoxia Patient had increased work of breathing requiring 15 L NRB with EMS, now down to 3 L.  On 3.5 L at baseline BNP over 2000, CXR showing "cardiomegaly with vascular congestion, pulmonary edema and small to moderate bilateral effusions. Basilar airspace disease may be due to atelectasis or pneumonia". IV Lasix.  Not currently on beta-blocker, possibly secondary to his myasthenia.  Not on ARB Initially declines IR consult for consideration of thoracentesis, now Plavix is being held by pulmonary and they are planning to do thoracentesis after washout, most likely towards the end of week. Daily weights with intake and output monitoring EF in September 2023 showed EF 25 to 30% Continue Lasix 40 mg IV twice daily   Lambert-Eaton myasthenic syndrome (HCC) Continue pyridostigmine and CellCept 12/11 c/o worsening of shortness of breath, could be exacerbation of myasthenia gravis NIF -18 cm H2O Notified to pulmonary critical care, patient's condition may get worse and  may need intubation Currently patient is full code Per neurology note in myasthenia crisis at this time so no need for IVIG, NIF improving  # AKI (acute kidney injury) ([place]).  Baseline less than 1 C 1.39--1.4--1.23 --1.26>>1.15 -Current with IV diuresis    # Coronary artery disease involving native coronary artery of native heart  without angina pectoris Elevated troponin, with history of NSTEMI 05/2022 Troponin 121-130, almost flat, suspect secondary to demand ischemia.  Patient denies chest pain and EKG nonacute  Troponin peaked at above 8000 for NSTEMI September 23 Continue aspirin and Plavix Can consider repeat echo to evaluate for wall motion abnormality Patient was seen by cardiology, recommended no ischemic workup at this time.    Essential hypertension 12/10 blood pressure dropped yesterday, patient received peripheral Levophed, critical care was consulted which was stopped after improvement in the blood pressure.  Patient received midodrine 10 mg p.o. one-time dose and started midodrine 5 mg p.o. 3 times daily. Currently blood pressure within lower normal limit. -Continue with midodrine -Continue to monitor   History of bladder cancer.  S/p  BCG BPH Continue finasteride and tamsulosin    Leg cramps, most likely due to iron deficiency Iron level 19, saturation ratio 6%, started Venofer 300 mg IV daily x 3 doses Folic acid 8.0, D66 level 449 within normal range and vitamin D 30 at lower end, started oral supplement. -Continue iron supplement  Body mass index is 25.83 kg/m.        Diet: Heart healthy DVT Prophylaxis: Subcutaneous Lovenox   Advance goals of care discussion: Full code  Family Communication: Discussed with son and daughter at bedside  Disposition:  Pt is from Home, admitted with acute on chronic systolic CHF exacerbation, still has shortness of breath, volume overload and low blood pressure, which precludes a safe discharge. Discharge to home, when stable, may need few more days to stay.  Subjective: Patient was seen and examined today.  Sitting in chair.  Denies any shortness of breath.  Physical Exam: General.  Frail elderly man, in no acute distress. Pulmonary.  Lungs clear bilaterally, normal respiratory effort.  Decreased BS at bases CV.  Regular rate and rhythm, no JVD,  rub or murmur. Abdomen.  Soft, nontender, nondistended, BS positive. CNS.  Alert and oriented .  No focal neurologic deficit. Extremities.  1+ LE edema, lymphedema, pulses intact and symmetrical. Psychiatry.  Appears to have some cognitive impairment  Vitals:   09/09/22 1100 09/09/22 1200 09/09/22 1300 09/09/22 1400  BP:   105/79 (!) 107/57  Pulse: (!) 44 (!) 36 (!) 54 (!) 57  Resp: (!) '23 16 19 '$ (!) 21  Temp:      TempSrc:      SpO2: 98% 98% 98% 98%  Weight:      Height:        Intake/Output Summary (Last 24 hours) at 09/09/2022 1521 Last data filed at 09/09/2022 1400 Gross per 24 hour  Intake 542.33 ml  Output 900 ml  Net -357.67 ml    Filed Weights   09/05/22 2255 09/08/22 1900 09/09/22 0500  Weight: 81.7 kg 89 kg 86.7 kg    Data Reviewed: I have personally reviewed and interpreted daily labs, tele strips, imagings as discussed above. I reviewed all nursing notes, pharmacy notes, vitals, pertinent old records I have discussed plan of care as described above with RN and patient/family.  CBC: Recent Labs  Lab 09/05/22 2259 09/06/22 0433 09/07/22 0630 09/08/22 0607 09/09/22 0502  WBC 10.1 10.2  6.1 5.1 5.5  HGB 11.2* 11.1* 10.8* 11.0* 11.1*  HCT 37.5* 38.0* 36.7* 36.7* 37.0*  MCV 96.2 97.4 95.6 94.8 96.9  PLT 243 225 188 184 967    Basic Metabolic Panel: Recent Labs  Lab 09/06/22 0433 09/06/22 1410 09/07/22 0630 09/08/22 0607 09/09/22 0502  NA 140 140 141 141 141  K 4.3 4.2 3.5 3.7 3.5  CL 99 99 100 97* 98  CO2 34* 34* 33* 34* 37*  GLUCOSE 136* 134* 119* 104* 123*  BUN 52* 50* 54* 54* 51*  CREATININE 1.39* 1.40* 1.23 1.26* 1.15  CALCIUM 8.9 8.8* 8.7* 8.7* 8.4*  MG  --  2.3 2.4 2.4 2.3  PHOS  --  3.6 3.7 3.4 3.6     Studies: DG Chest Port 1 View  Result Date: 09/08/2022 CLINICAL DATA:  Shortness of breath. EXAM: PORTABLE CHEST 1 VIEW COMPARISON:  September 05, 2022. FINDINGS: Stable cardiomegaly. Stable bilateral pleural effusions are noted with  associated bibasilar atelectasis or edema. Bony thorax is unremarkable. IMPRESSION: Stable cardiomegaly with central pulmonary vascular congestion. Stable bilateral pleural effusions with associated bibasilar atelectasis or pneumonia. Aortic Atherosclerosis (ICD10-I70.0). Electronically Signed   By: Marijo Conception M.D.   On: 09/08/2022 15:38    Scheduled Meds:  aspirin EC  81 mg Oral Daily   Chlorhexidine Gluconate Cloth  6 each Topical Daily   enoxaparin (LOVENOX) injection  40 mg Subcutaneous Q24H   feeding supplement  237 mL Oral TID BM   finasteride  5 mg Oral Daily   furosemide  40 mg Intravenous BID   guaiFENesin  1,200 mg Oral BID   midodrine  5 mg Oral TID WC   mycophenolate  1,000 mg Oral Q12H   pyridostigmine  30 mg Oral 6 X Daily   tamsulosin  0.4 mg Oral Daily   Vitamin D (Ergocalciferol)  50,000 Units Oral Q7 days   Continuous Infusions:  iron sucrose Stopped (09/09/22 1300)   PRN Meds: acetaminophen **OR** acetaminophen, ipratropium-albuterol, ondansetron **OR** ondansetron (ZOFRAN) IV, mouth rinse  Time spent: 50 minutes  This record has been created using Systems analyst. Errors have been sought and corrected,but may not always be located. Such creation errors do not reflect on the standard of care.   Author: Lorella Nimrod. MD [facility] 09/09/2022 3:21 PM  To reach On-call, see care teams to locate the attending and reach out to them via [email or web address]. If 7PM-7AM, please contact night-coverage If you still have difficulty reaching the attending provider, please page the [facility] (Director on Call) for [facility] on amion for assistance.

## 2022-09-09 NOTE — Progress Notes (Signed)
       CROSS COVER NOTE  NAME: [name] MRN: [id number] DOB : 31-Jul-1926 ATTENDING PHYSICIAN: Arnetha Courser, MD    Date of Service   09/09/2022   HPI/Events of Note   Medication request received for sleep aid.  Interventions   Assessment/Plan:  Melatonin     This document was prepared using Dragon voice recognition software and may include unintentional dictation errors.  Bishop Limbo DNP, MBA, FNP-BC Nurse Practitioner [facility] Pager [phone]

## 2022-09-09 NOTE — Progress Notes (Signed)
NIF -15 at this time, pt had fair effort. Pt on 3L [place] 96%

## 2022-09-09 NOTE — Progress Notes (Signed)
NIF, -15 best of 3 with good effort. Pt sitting in chair on 3L [place], SPO2 95%.  Pt refusing BIPAP, BIPAP remains at bedside.

## 2022-09-09 NOTE — Progress Notes (Signed)
NAME:  [name], MRN:  [id number], DOB:  [birth year], LOS: 3 ADMISSION DATE:  09/06/2022, CONSULTATION DATE:  12/11 REFERRING MD:  Dwyane Dee, CHIEF COMPLAINT:  "don't feel right"   History of Present Illness:  88 year old male with chronic systolic heart failure admitted for a complication of the same.  PCCM consulted for hypercarbic respiratory failure, declining NIF measurements.    Pertinent  Medical History   Past Medical History:  Diagnosis Date   Abnormal chest CT 03/28/2017   Allergy    Anemia    Angina pectoris ([place]) 04/02/2011   Aortic aneurysm ([place]) 03/28/2017   Saw Dr Genevive Bi 04/16/17 - recommended f/u CT in 3 months.     Atherosclerosis of both carotid arteries 07/17/2014   Basal cell carcinoma 06/25/2021   Left ant neck. Nodulocystic pattern, close to margin   Benign lipomatous neoplasm of skin and subcutaneous tissue of left arm 10/04/2013   Bilateral carotid artery stenosis 08/30/2014   Overview:  Less than 50% 2015   Bladder cancer ([place]) 03/05/2013   CAD (coronary artery disease) 06/18/2014   Cancer ([place]) 11/24/2012   bladder. BCG treatments by Dr Jacqlyn Larsen.   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 WD SCC. Re-shaved  01/14/2016. Excised 02/05/2016, margins free.   Squamous cell carcinoma of skin 11/03/2018   Right cheek ant. to sideburn. Poorly differentiated.   Squamous cell carcinoma of skin 02/02/2019   Right mid dorsum forearm. WD SCC. EDC.     Significant Hospital Events: Including procedures, antibiotic start and stop dates in addition to other pertinent events   September 2023 TTE showed LVEF 25 to 30% September 08, 2022 chest x-ray showed bilateral pleural effusions, layering, bibasilar atelectasis, started BIPAP at night, plavix held  Interim History / Subjective:  Didn't sleep with BIPAP Feels OK  Objective   Blood pressure (!) 113/49, pulse (!) 37, temperature 97.8 F (36.6 C), temperature source Oral, resp. rate (!) 23, height '5\' 10"'$  (1.778 m), weight 86.7 kg, SpO2 97 %.        Intake/Output Summary (Last 24 hours) at 09/09/2022 1107 Last data filed at 09/09/2022 0600 Gross per 24 hour  Intake 386.64 ml  Output 900 ml  Net -513.36 ml   Filed Weights   09/05/22 2255 09/08/22 1900 09/09/22 0500  Weight: 81.7 kg 89 kg 86.7 kg    Examination:  General:  Resting comfortably in chair HENT: NCAT OP clear PULM: Few crackles left base, otherwise clear, normal effort CV: RRR, no mgr GI: BS+, soft, nontender MSK: normal bulk and tone Neuro: awake, alert, no distress, MAEW   Resolved Hospital Problem list     Assessment & Plan:  Chronic respiratory failure with hypercarbia Myasthenia gravis, possible flare Chronic  systolic heart failure, acute exacerbation Shortness of breath Bilateral pleural effusions  Discussion: Difficult to know what's causing his declining NIF numbers.  My suspicion is that it is reflective of his generalized weakness in the setting of severe systolic heart failure and decreased lung and chest wall compliance from volume overload.  In general he remains stable.  Not tolerant of full face NIMV mask, will attempt nasal mask.  He will need chronic NIMV for  hypercarbic respiratory failure and systolic heart failure.    Plan: Continue attempts at NIMV, trial with nasal mask tonight Flutter valve  Cough assist Guaifenesin Plan therapeutic thoracentesis later in the week after holding plavix Appreciate neurology consult Consider stopping NIF measurements  CODE STATUS: currently full code, but the patient has equipoise about this position and is deferring this decision to his daughter.  All parties are in agreement with palliative care consultation.      Best Practice (right click and "Reselect all SmartList Selections" daily)   Per HPI  Critical care time: n/a    Roselie Awkward, MD [facility] Pager: [phone] Cell: [phone] After 7:00 pm call Elink  [phone]

## 2022-09-10 ENCOUNTER — Encounter: Payer: Medicare Other | Admitting: Occupational Therapy

## 2022-09-10 DIAGNOSIS — I5023 Acute on chronic systolic (congestive) heart failure: Secondary | ICD-10-CM | POA: Diagnosis not present

## 2022-09-10 DIAGNOSIS — J90 Pleural effusion, not elsewhere classified: Secondary | ICD-10-CM

## 2022-09-10 DIAGNOSIS — Z7189 Other specified counseling: Secondary | ICD-10-CM

## 2022-09-10 DIAGNOSIS — Z515 Encounter for palliative care: Secondary | ICD-10-CM

## 2022-09-10 DIAGNOSIS — Z66 Do not resuscitate: Secondary | ICD-10-CM

## 2022-09-10 LAB — CBC
HCT: 38.4 % — ABNORMAL LOW (ref 39.0–52.0)
Hemoglobin: 11.1 g/dL — ABNORMAL LOW (ref 13.0–17.0)
MCH: 28 pg (ref 26.0–34.0)
MCHC: 28.9 g/dL — ABNORMAL LOW (ref 30.0–36.0)
MCV: 96.7 fL (ref 80.0–100.0)
Platelets: 170 10*3/uL (ref 150–400)
RBC: 3.97 MIL/uL — ABNORMAL LOW (ref 4.22–5.81)
RDW: 15.8 % — ABNORMAL HIGH (ref 11.5–15.5)
WBC: 5.4 10*3/uL (ref 4.0–10.5)
nRBC: 0 % (ref 0.0–0.2)

## 2022-09-10 LAB — BASIC METABOLIC PANEL
Anion gap: 7 (ref 5–15)
BUN: 48 mg/dL — ABNORMAL HIGH (ref 8–23)
CO2: 36 mmol/L — ABNORMAL HIGH (ref 22–32)
Calcium: 8.8 mg/dL — ABNORMAL LOW (ref 8.9–10.3)
Chloride: 100 mmol/L (ref 98–111)
Creatinine, Ser: 1.04 mg/dL (ref 0.61–1.24)
GFR, Estimated: >60 mL/min (ref >60)
Glucose, Bld: 119 mg/dL — ABNORMAL HIGH (ref 70–99)
Potassium: 3.7 mmol/L (ref 3.5–5.1)
Sodium: 143 mmol/L (ref 135–145)

## 2022-09-10 NOTE — Evaluation (Signed)
Physical Therapy Evaluation Patient Details Name: [name] MRN: [id number] DOB: August 16, 1926 Today's Date: 09/10/2022  History of Present Illness  88 year old male presenting to the ED with shortness of breath, orthopnea and lower extremity edema, admitted with acute on chronic HFrEF.  Worsening bilateral pleural effusion; potential planned thoracentesis; PMH significant for CAD, HTN, BPH, Lambert-Eaton myasthenic syndrome, systolic CHF (EF 25 to 40% 05/2022) on home O2 at 2L  Clinical Impression  Pt did very well with PT exam and showed ability to ambulate ~200 ft with relative ease.  He normally uses only a SPC or no AD, but did need the RW today during the effort but maintained consistent cadence/speed with no overt safety issues.  Pt on baseline 3L O2 t/o the effort with sats staying in the 90s essentially the entire time.  Pt is not at his independent baseline but did surprisingly well with PT exam.  Will continue PT while hospitalized and would like to trial steps to insure he is able to enter/exit the home safely.       Recommendations for follow up therapy are one component of a multi-disciplinary discharge planning process, led by the attending physician.  Recommendations may be updated based on patient status, additional functional criteria and insurance authorization.  Follow Up Recommendations Home health PT      Assistance Recommended at Discharge Intermittent Supervision/Assistance  Patient can return home with the following  A little help with bathing/dressing/bathroom;Assistance with cooking/housework;Help with stairs or ramp for entrance    Equipment Recommendations None recommended by PT  Recommendations for Other Services       Functional Status Assessment Patient has had a recent decline in their functional status and demonstrates the ability to make significant improvements in function in a reasonable and predictable amount of time.     Precautions / Restrictions  Precautions Precautions: Fall Precaution Comments: watch o2 Restrictions Weight Bearing Restrictions: No      Mobility  Bed Mobility               General bed mobility comments: recliner pre and post session    Transfers Overall transfer level: Needs assistance Equipment used: Rolling walker (2 wheels) Transfers: Sit to/from Stand Sit to Stand: Min assist           General transfer comment: cuing to insure appropriate set up, did not need direct physical assist    Ambulation/Gait Ambulation/Gait assistance: Supervision Gait Distance (Feet): 200 Feet Assistive device: Rolling walker (2 wheels)         General Gait Details: on 3L, O2 remained in the 90s.  Pt showed ability to maintain consistent cadence with no overt LOBs or safety issues.  He does have some b/l LE swelling and limited foot clearance but reports this as near baseline.  He does not normally need RW for support, not ready to trial with single UE/cane.  Stairs            Wheelchair Mobility    Modified Rankin (Stroke Patients Only)       Balance Overall balance assessment: Needs assistance Sitting-balance support: Feet supported Sitting balance-Leahy Scale: Good     Standing balance support: Bilateral upper extremity supported, During functional activity, Reliant on assistive device for balance Standing balance-Leahy Scale: Good Standing balance comment: Pt was able to maintain balance well with walker - showed no balance issues  Pertinent Vitals/Pain Pain Assessment Pain Assessment: No/denies pain    Home Living Family/patient expects to be discharged to:: Private residence Living Arrangements: Alone Available Help at Discharge: Family;Available 24 hours/day Type of Home: House Home Access: Stairs to enter Entrance Stairs-Rails: Psychiatric nurse of Steps: 6   Home Layout: One level Home Equipment: Cane - single  point;Grab bars - tub/shower;BSC/3in1;Rolling Walker (2 wheels);Shower seat;Wheelchair - manual Additional Comments: Patient has lived in house since the 1950's. Has two children who can help and stay with him after discharge.    Prior Function Prior Level of Function : Independent/Modified Independent             Mobility Comments: furniture walks vs SPC in home; leading to hospitalization needing RW ADLs Comments: some assist for IADLs since previous hospitalization, generally MOD I with ADLs     Hand Dominance        Extremity/Trunk Assessment   Upper Extremity Assessment Upper Extremity Assessment: Generalized weakness (age appropriate limitations)    Lower Extremity Assessment Lower Extremity Assessment: Generalized weakness (age appropriate limitations)    Cervical / Trunk Assessment Cervical / Trunk Assessment: Kyphotic  Communication   Communication: No difficulties  Cognition Arousal/Alertness: Awake/alert Behavior During Therapy: WFL for tasks assessed/performed Overall Cognitive Status: Within Functional Limits for tasks assessed                                          General Comments General comments (skin integrity, edema, etc.): spo2 >89% on 3 L via  during mobility    Exercises     Assessment/Plan    PT Assessment Patient needs continued PT services  PT Problem List Decreased strength;Decreased range of motion;Decreased activity tolerance;Decreased balance;Decreased mobility;Decreased knowledge of use of DME;Decreased safety awareness;Cardiopulmonary status limiting activity       PT Treatment Interventions DME instruction;Gait training;Stair training;Functional mobility training;Therapeutic activities;Therapeutic exercise;Balance training;Patient/family education    PT Goals (Current goals can be found in the Care Plan section)  Acute Rehab PT Goals Patient Stated Goal: Go home ASAP PT Goal Formulation: With patient Time  For Goal Achievement: 09/23/22 Potential to Achieve Goals: Fair    Frequency Min 2X/week     Co-evaluation   Reason for Co-Treatment: Complexity of the patient's impairments (multi-system involvement);To address functional/ADL transfers   OT goals addressed during session: ADL's and self-care       AM-PAC PT "6 Clicks" Mobility  Outcome Measure Help needed turning from your back to your side while in a flat bed without using bedrails?: A Little Help needed moving from lying on your back to sitting on the side of a flat bed without using bedrails?: A Little Help needed moving to and from a bed to a chair (including a wheelchair)?: A Little Help needed standing up from a chair using your arms (e.g., wheelchair or bedside chair)?: A Little Help needed to walk in hospital room?: A Little Help needed climbing 3-5 steps with a railing? : A Little 6 Click Score: 18    End of Session Equipment Utilized During Treatment: Gait belt;Oxygen (3L (which is baseline)) Activity Tolerance: Patient tolerated treatment well Patient left: in chair;with call bell/phone within reach;with family/visitor present Nurse Communication: Mobility status PT Visit Diagnosis: Muscle weakness (generalized) (M62.81);Difficulty in walking, not elsewhere classified (R26.2)    Time: 4709-6283 PT Time Calculation (min) (ACUTE ONLY): 15 min   Charges:  PT Evaluation $PT Eval Low Complexity: 1 Low          Kreg Shropshire, DPT 09/10/2022, 3:51 PM

## 2022-09-10 NOTE — Plan of Care (Signed)
NIF is -30

## 2022-09-10 NOTE — Progress Notes (Signed)
[facility] Progress Note  Patient: [name]    JJO:[id number]  DOA: 09/06/2022     Date of Service: the patient was seen and examined on 09/10/2022  Chief Complaint  Patient presents with   Shortness of Breath   Brief hospital course:  [name] is a 88 y.o. male with medical history significant for CAD, HTN, BPH, Lambert-Eaton myasthenic syndrome, systolic CHF (EF 25 to 16% 05/2022) on home O2 at 2L, admitted twice in the past 3 months for CHF exacerbation and respiratory failure requiring BiPAP, most recently from 11/6 to 11/9 who presents to the ED with shortness of breath, orthopnea and lower extremity edema similar to recent prior exacerbations. In the past week he had to go up on hte oxygen to 3.5 from 2L. On his admission in September he was also diagnosed with NSTEMI.  At the present time patient denies chest pain, cough, fever or chills.  Denies leg pain.  On arrival of EMS patient had increased work of breathing and was initially placed on NRB at 15 L ED course and data review: BP 107/74 with pulse 62.  Afebrile with O2 sat of 100% on 4 L.  Troponin 121-130 with BNP 2080.Marland Kitchen  CBC significant for hemoglobin 11.2, down from 12.4.Creatinine 1.49 up from 0.93 about 4 weeks prior EKG, personally viewed and interpreted showing NSR at 68 with LVH and nonischemic ST-T wave changes chest x-ray showed CHF and possible pneumonia as follows: IMPRESSION: Cardiomegaly with vascular congestion, pulmonary edema and small to moderate bilateral effusions. Basilar airspace disease may be due to atelectasis or pneumonia.   12/12: Initially admitted for acute on chronic HFrEF.  Worsening bilateral pleural effusion.  Critical care on board, his home Plavix was placed on hold and most likely will get his thoracentesis later this week. Neurology was also consulted to rule out myasthenia crisis due to worsening NIF which is now improving.  Less likely a myasthenia crisis and no IVIG needed at  this time.  See neurology note in detail. Currently seems stable at his baseline oxygen requirement of 2 to 3 L.  Unable to tolerate BiPAP mask, pulmonology is recommending using nasal prongs to continue BiPAP at night as it is improving NIF.  Patient is very high risk for deterioration and mortality based on age and underlying significant comorbidities.  Currently full code.  Palliative care was also consulted to discuss goals of care.  12/13: Remained hemodynamically stable and muscle strength continue to improve.  Discussed with patient and daughter-in-law about the CODE STATUS, patient wants to be DNR and DNI, apparently his daughter insisted on changing him to full code.  Patient is alert and oriented and can make his own decisions. CODE STATUS changed to DNR.  Pending rest of the goals of care discussion with palliative care today. Patient would like to go back home with home health along with son and DIL help. PT/OT evaluation ordered  Assessment and Plan:  # Acute on chronic systolic CHF (congestive heart failure) (HCC) Bilateral pleural effusion Acute on chronic respiratory failure with hypoxia Patient had increased work of breathing requiring 15 L NRB with EMS, now down to 3 L.  On 3.5 L at baseline BNP over 2000, CXR showing "cardiomegaly with vascular congestion, pulmonary edema and small to moderate bilateral effusions. Basilar airspace disease may be due to atelectasis or pneumonia". IV Lasix.  Not currently on beta-blocker, possibly secondary to his myasthenia.  Not on ARB Initially declines IR consult for consideration  of thoracentesis, now Plavix is being held by pulmonary and they are planning to do thoracentesis after washout, most likely towards the end of week. Daily weights with intake and output monitoring EF in September 2023 showed EF 25 to 30% Continue Lasix 40 mg IV twice daily.  Lambert-Eaton myasthenic syndrome (HCC) Continue pyridostigmine and CellCept 12/11 c/o  worsening of shortness of breath, could be exacerbation of myasthenia gravis NIF -30 cm H2O today, clinically seems improving with improvement in his strength, per pulmonary note no need to further check NIF. Per neurology note in myasthenia crisis at this time so no need for IVIG, NIF improving. Patient need to have a close follow-up with outpatient neurology to consider restarting Vyvgart  They also placed a list of medications to be avoided in myasthenia gravis, list was placed on discharge instructions.  # AKI (acute kidney injury) ([place]).  Baseline less than 1 C 1.39--1.4--1.23 --1.26>>1.15>>1.04 -Current with IV diuresis    # Coronary artery disease involving native coronary artery of native heart without angina pectoris Elevated troponin, with history of NSTEMI 05/2022 Troponin 121-130, almost flat, suspect secondary to demand ischemia.  Patient denies chest pain and EKG nonacute  Troponin peaked at above 8000 for NSTEMI September 23 Continue aspirin and Plavix-currently on hold for thoracentesis in a day. Can consider repeat echo to evaluate for wall motion abnormality Patient was seen by cardiology, recommended no ischemic workup at this time.    Essential hypertension 12/10 blood pressure dropped yesterday, patient received peripheral Levophed, critical care was consulted which was stopped after improvement in the blood pressure.  Patient received midodrine 10 mg p.o. one-time dose and started midodrine 5 mg p.o. 3 times daily. Currently blood pressure within lower normal limit. -Continue with midodrine -Continue to monitor   History of bladder cancer.  S/p  BCG BPH Continue finasteride and tamsulosin    Leg cramps, most likely due to iron deficiency Iron level 19, saturation ratio 6%, started Venofer 300 mg IV daily x 3 doses Folic acid 8.0, Y18 level 449 within normal range and vitamin D 30 at lower end, started oral supplement. -Continue iron supplement  Body mass  index is 25.83 kg/m.        Diet: Heart healthy DVT Prophylaxis: Subcutaneous Lovenox   Advance goals of care discussion: Full code  Family Communication: Discussed with daughter-in-law at bedside  Disposition: Wants to go home with home health   Subjective: Patient was sitting in chair comfortably when seen today.  At his baseline oxygen requirement.  Ate his breakfast.  He wants to go home.  We discussed his CODE STATUS and he was very clear that he does not want to be resuscitated.  Physical Exam: General.  Frail elderly man, in no acute distress. Pulmonary.  Lungs clear bilaterally, normal respiratory effort.  Decreased breath sound at bases CV.  Regular rate and rhythm, no JVD, rub or murmur. Abdomen.  Soft, nontender, nondistended, BS positive. CNS.  Alert and oriented .  No focal neurologic deficit. Extremities.  Trace LE edema, no cyanosis, pulses intact and symmetrical. Psychiatry.  Judgment and insight appears normal.   Vitals:   09/10/22 0700 09/10/22 0800 09/10/22 0900 09/10/22 1200  BP: (!) 94/42 (!) 89/45 100/62 117/62  Pulse: (!) 41 98 (!) 50 (!) 49  Resp: '17 14 17 17  '$ Temp:   97.9 F (36.6 C)   TempSrc:   Oral   SpO2: 92% 97% 95% 97%  Weight:      Height:  Intake/Output Summary (Last 24 hours) at 09/10/2022 1335 Last data filed at 09/10/2022 1258 Gross per 24 hour  Intake 289.19 ml  Output 2000 ml  Net -1710.81 ml    Filed Weights   09/05/22 2255 09/08/22 1900 09/09/22 0500  Weight: 81.7 kg 89 kg 86.7 kg    Data Reviewed: Prior data reviewed  CBC: Recent Labs  Lab 09/06/22 0433 09/07/22 0630 09/08/22 0607 09/09/22 0502 09/10/22 0259  WBC 10.2 6.1 5.1 5.5 5.4  HGB 11.1* 10.8* 11.0* 11.1* 11.1*  HCT 38.0* 36.7* 36.7* 37.0* 38.4*  MCV 97.4 95.6 94.8 96.9 96.7  PLT 225 188 184 152 660    Basic Metabolic Panel: Recent Labs  Lab 09/06/22 1410 09/07/22 0630 09/08/22 0607 09/09/22 0502 09/10/22 0259  NA 140 141 141 141 143   K 4.2 3.5 3.7 3.5 3.7  CL 99 100 97* 98 100  CO2 34* 33* 34* 37* 36*  GLUCOSE 134* 119* 104* 123* 119*  BUN 50* 54* 54* 51* 48*  CREATININE 1.40* 1.23 1.26* 1.15 1.04  CALCIUM 8.8* 8.7* 8.7* 8.4* 8.8*  MG 2.3 2.4 2.4 2.3  --   PHOS 3.6 3.7 3.4 3.6  --      Studies: DG Chest Port 1 View  Result Date: 09/09/2022 CLINICAL DATA:  Acute respiratory failure EXAM: PORTABLE CHEST 1 VIEW COMPARISON:  09/08/2022 FINDINGS: Single frontal view of the chest demonstrates stable enlargement of the cardiac silhouette. Continued central vascular congestion, bibasilar airspace disease, and bilateral pleural effusions. No pneumothorax. No acute bony abnormalities. IMPRESSION: 1. Stable constellation of findings most consistent with congestive heart failure. Superimposed pneumonia cannot be excluded. Electronically Signed   By: Randa Ngo M.D.   On: 09/09/2022 17:53    Scheduled Meds:  aspirin EC  81 mg Oral Daily   Chlorhexidine Gluconate Cloth  6 each Topical Daily   enoxaparin (LOVENOX) injection  40 mg Subcutaneous Q24H   feeding supplement  237 mL Oral TID BM   finasteride  5 mg Oral Daily   furosemide  40 mg Intravenous BID   guaiFENesin  1,200 mg Oral BID   midodrine  5 mg Oral TID WC   mycophenolate  1,000 mg Oral Q12H   pyridostigmine  30 mg Oral 6 X Daily   tamsulosin  0.4 mg Oral Daily   Vitamin D (Ergocalciferol)  50,000 Units Oral Q7 days   Continuous Infusions:   PRN Meds: acetaminophen **OR** acetaminophen, ipratropium-albuterol, melatonin, ondansetron **OR** ondansetron (ZOFRAN) IV, mouth rinse  Time spent: 45 minutes  This record has been created using Systems analyst. Errors have been sought and corrected,but may not always be located. Such creation errors do not reflect on the standard of care.   Author: Lorella Nimrod. MD [facility] 09/10/2022 1:35 PM  To reach On-call, see care teams to locate the attending and reach out to them via  [email or web address]. If 7PM-7AM, please contact night-coverage If you still have difficulty reaching the attending provider, please page the [facility] (Director on Call) for [facility] on amion for assistance.

## 2022-09-10 NOTE — Progress Notes (Signed)
NIF is -18, best of 2 with good pt effort.

## 2022-09-10 NOTE — Progress Notes (Signed)
S: On further review of records, recently stopped Vyvgart (Efgartigimod alfa) due to cost  Cannot tolerate BiPAP, remains on nasal cannula  O: Current vital signs: BP 100/62 (BP Location: Right Arm)   Pulse (!) 50   Temp 97.9 F (36.6 C) (Oral)   Resp 17   Ht '5\' 10"'$  (1.778 m)   Wt 86.7 kg   SpO2 95%   BMI 27.43 kg/m  Vital signs in last 24 hours: Temp:  [97.7 F (36.5 C)-98.2 F (36.8 C)] 97.9 F (36.6 C) (12/13 0900) Pulse Rate:  [36-98] 50 (12/13 0900) Resp:  [14-32] 17 (12/13 0900) BP: (86-120)/(42-91) 100/62 (12/13 0900) SpO2:  [92 %-98 %] 95 % (12/13 0900)  NIF -30 this morning, single breath test 17 (improved from 14 yesterday), FE remains 4 on the right and 4+ on the left, Hip flexion better today (4 on the right 4- on the left, tested in seating position). Continues to have mild myoclonic jerking    Current Facility-Administered Medications:    acetaminophen (TYLENOL) tablet 650 mg, 650 mg, Oral, Q6H PRN, 650 mg at 09/09/22 1514 **OR** acetaminophen (TYLENOL) suppository 650 mg, 650 mg, Rectal, Q6H PRN, Athena Masse, MD   aspirin EC tablet 81 mg, 81 mg, Oral, Daily, Judd Gaudier V, MD, 81 mg at 09/10/22 6283   Chlorhexidine Gluconate Cloth 2 % PADS 6 each, 6 each, Topical, Daily, Val Riles, MD, 6 each at 09/10/22 1039   enoxaparin (LOVENOX) injection 40 mg, 40 mg, Subcutaneous, Q24H, Judd Gaudier V, MD, 40 mg at 09/10/22 0951   feeding supplement (ENSURE ENLIVE / ENSURE PLUS) liquid 237 mL, 237 mL, Oral, TID BM, Val Riles, MD, 237 mL at 09/09/22 1604   finasteride (PROSCAR) tablet 5 mg, 5 mg, Oral, Daily, Judd Gaudier V, MD, 5 mg at 09/10/22 0954   furosemide (LASIX) injection 40 mg, 40 mg, Intravenous, BID, Val Riles, MD, 40 mg at 09/10/22 0954   guaiFENesin (MUCINEX) 12 hr tablet 1,200 mg, 1,200 mg, Oral, BID, McQuaid, Douglas B, MD, 1,200 mg at 09/10/22 0953   ipratropium-albuterol (DUONEB) 0.5-2.5 (3) MG/3ML nebulizer solution 3 mL, 3 mL,  Nebulization, Q6H PRN, Val Riles, MD, 3 mL at 09/09/22 1550   iron sucrose (VENOFER) 300 mg in sodium chloride 0.9 % 250 mL IVPB, 300 mg, Intravenous, Q24H, Val Riles, MD, Last Rate: 176.7 mL/hr at 09/10/22 1113, 300 mg at 09/10/22 1113   melatonin tablet 5 mg, 5 mg, Oral, QHS PRN, Foust, Katy L, NP, 5 mg at 09/09/22 2054   midodrine (PROAMATINE) tablet 5 mg, 5 mg, Oral, TID WC, Val Riles, MD, 5 mg at 09/10/22 1517   mycophenolate (CELLCEPT) capsule 1,000 mg, 1,000 mg, Oral, Q12H, Delena Bali, RPH, 1,000 mg at 09/10/22 0546   ondansetron (ZOFRAN) tablet 4 mg, 4 mg, Oral, Q6H PRN **OR** ondansetron (ZOFRAN) injection 4 mg, 4 mg, Intravenous, Q6H PRN, Athena Masse, MD   Oral care mouth rinse, 15 mL, Mouth Rinse, PRN, Val Riles, MD   pyridostigmine (MESTINON) tablet 30 mg, 30 mg, Oral, 6 X Daily, Judd Gaudier V, MD, 30 mg at 09/10/22 1034   tamsulosin (FLOMAX) capsule 0.4 mg, 0.4 mg, Oral, Daily, Judd Gaudier V, MD, 0.4 mg at 09/10/22 6160   Vitamin D (Ergocalciferol) (DRISDOL) 1.25 MG (50000 UNIT) capsule 50,000 Units, 50,000 Units, Oral, Q7 days, Val Riles, MD, 50,000 Units at 09/08/22 2117  A: With discontinuation of Efgartigimod alfa in Sept he is less immunosuppressed than previously and at risk of  worsening MG/LEMS. However improving NIF and SBT as well as hip flexion strength today argues against emergent need for IVIG inpatient especially given his comorbidities, do not feel risk/benefit profile favors this at this time.   P: Continue to trend NIF/FVC q12hr Close outpatient follow-up for consideration of restarting Vyvgart Avoid medications below to avoid worsening MG symptoms Neurology will be available as needed  Drugs to avoid if you have Myasthenia Gravis:   Many drugs have been reported to have adverse effects in patients with MG (see below). However, not all patients react adversely to all these drugs. Conversely, not all "safe" drugs can be used with  impunity in patients with MG.As a rule, the listed drugs should be avoided whenever possible, and patients with MG should be followed closely when any new drug is introduced.  Drugs that may exacerbate MG  Antibiotics  Aminoglycosides: e.g., streptomycin, tobramycin, kanamycin  Quinolones: e.g., ciprofloxacin, levofloxacin, ofloxacin, gatifloxacin  Macrolides: e.g., erythromycin, azithromycin, telithromycin  Nondepolarizing muscle relaxants for surgery  d-Tubocurarine (curare), pancuronium, vecuronium, atracurium  Beta-blocking agents  Propranolol, atenolol, metoprolol  Local anesthetics and related agents  Procaine, Xylocaine in large amounts  Procainamide (for arrhythmias)  Botulinum toxin  Botox exacerbates weakness.  Quinine derivatives  Quinine, quinidine, chloroquine, mefloquine ([place]am)  Magnesium  Decreases ACh release  Penicillamine  May cause MG  Drugs with important interactions in MG  Cyclosporine  Broad range of drug interactions, which may raise or lower cyclosporine levels  Azathioprine  Avoid allopurinol; combination may result in myelosuppression.     Discussed with CCM   >35 min of care majority in direct care coordination

## 2022-09-10 NOTE — Progress Notes (Signed)
NAME:  [name], MRN:  [id number], DOB:  [birth year], LOS: 4 ADMISSION DATE:  09/06/2022, CONSULTATION DATE:  12/11 REFERRING MD:  Dwyane Dee, CHIEF COMPLAINT:  "don't feel right"   History of Present Illness:  88 year old male with chronic systolic heart failure admitted for a complication of the same.  PCCM consulted for hypercarbic respiratory failure, declining NIF measurements.    Pertinent  Medical History   Past Medical History:  Diagnosis Date   Abnormal chest CT 03/28/2017   Allergy    Anemia    Angina pectoris ([place]) 04/02/2011   Aortic aneurysm ([place]) 03/28/2017   Saw Dr Genevive Bi 04/16/17 - recommended f/u CT in 3 months.     Atherosclerosis of both carotid arteries 07/17/2014   Basal cell carcinoma 06/25/2021   Left ant neck. Nodulocystic pattern, close to margin   Benign lipomatous neoplasm of skin and subcutaneous tissue of left arm 10/04/2013   Bilateral carotid artery stenosis 08/30/2014   Overview:  Less than 50% 2015   Bladder cancer ([place]) 03/05/2013   CAD (coronary artery disease) 06/18/2014   Cancer ([place]) 11/24/2012   bladder. BCG treatments by Dr Jacqlyn Larsen.   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 WD SCC. Re-shaved  01/14/2016. Excised 02/05/2016, margins free.   Squamous cell carcinoma of skin 11/03/2018   Right cheek ant. to sideburn. Poorly differentiated.   Squamous cell carcinoma of skin 02/02/2019   Right mid dorsum forearm. WD SCC. EDC.     Significant Hospital Events: Including procedures, antibiotic start and stop dates in addition to other pertinent events   September 2023 TTE showed LVEF 25 to 30% September 08, 2022 chest x-ray showed bilateral pleural effusions, layering, bibasilar atelectasis, started BIPAP at night, plavix held  Interim History / Subjective:   Did not sleep with BIPAP overnight  Objective   Blood pressure 102/62, pulse (!) 48, temperature 98.2 F (36.8 C), temperature source Oral, resp. rate 14, height '5\' 10"'$  (1.778 m), weight 86.7 kg, SpO2 98 %.        Intake/Output Summary (Last 24 hours) at 09/10/2022 0726 Last data filed at 09/10/2022 0549 Gross per 24 hour  Intake 275.69 ml  Output 1300 ml  Net -1024.31 ml   Filed Weights   09/05/22 2255 09/08/22 1900 09/09/22 0500  Weight: 81.7 kg 89 kg 86.7 kg    Examination:  General:  Resting comfortably in chair HENT: NCAT OP clear PULM: Diminished bases B, normal effort CV: RRR, no mgr GI: BS+, soft, nontender MSK: normal bulk and tone Neuro: awake, alert, no distress, MAEW    Resolved Hospital Problem list     Assessment & Plan:  Chronic respiratory failure with hypercarbia/hypoxemia LE Myasthenia gravis, no clear evidence of flare  Chronic systolic heart failure, acute exacerbation Shortness of breath Bilateral pleural effusions  Discussion: Generally declining overall condition including but not limited to chronic respiratory failure with hypercarbia primarily due to systolic heart failure and failure to thrive.  He is not interested in using BIPAP, he does not want CPR or any form of life support including mechanical ventilation.  Plan: Hold off on further attempts at BIPAP For comfort, could  consider bilateral therapeutic thoracentesis on 12/15 after plavix has been held Flutter Cough assist Guaifenesin Diuresis able Consider stopping NIF measurements  CODE STATUS: Currently orders written for Full Code but he desires DNR.  His daughter has felt full code appropriate in the past.  Plan to discuss his wishes with his daughter and palliative care today.    Best Practice (right click and "Reselect all SmartList Selections" daily)   Per HPI  Critical care time: n/a    Roselie Awkward, MD [facility] Pager: [phone] Cell: [phone] After 7:00 pm call Elink  [phone]

## 2022-09-10 NOTE — Consult Note (Signed)
Consultation Note Date: 09/10/2022   Patient Name: CHILD CAMPOY  DOB: 09/14/1926  MRN: [id number]  Age / Sex: 88 y.o., male  PCP: [name], MD Referring Physician: Lorella Nimrod, MD  Reason for Consultation: Establishing goals of care  HPI/Patient Profile: 88 y.o. male  with past medical history of CAD, HTN, BPH, Lambert-Eaton myasthenic syndrome, systolic CHF (EF 25 to 40% 05/2022) on home O2 at 2L, admitted twice in the past 3 months for CHF exacerbation and respiratory failure requiring BiPAP  admitted on 09/06/2022 with shortness of breath, orthopnea and lower extremity edema similar to recent prior exacerbations.  Patient diagnosed with acute on chronic CHF.  Found to have worsening bilateral pleural effusions.  PMT consulted to discuss goals of care.  Clinical Assessment and Goals of Care: I have reviewed medical records including EPIC notes, labs and imaging, received report from ICU staff, assessed the patient and then met with patient to discuss diagnosis prognosis, GOC, EOL wishes, disposition and options.  Prior to meeting with patient I reached out to patient's daughter as per chart review she has been heavily involved in patient's medical decisions.  I attempted to schedule family meeting however she is unavailable today.  She provides background information on patient sharing that patient still lives independently, does his own laundry, cooks for himself.  She feels that he has lost some function since his previous hospitalization especially now that he needs oxygen most of the time.  She tells me patient has been down recently and she expects this is related to his loss of independence as well as he continues to grieve the loss of his wife 3 years ago.  She tells me patient has been intubated 1 time in the past and was very short.  We discussed patient's declining health and she expresses understanding that Mr. Raju heart failure  will is irreversible and likely to continue to worsen as time goes on.  She tells me in previous conversations patient has always expressed desire for full CODE STATUS.  We discussed that per chart review it seems he is expressing different desires this admission.  She tells me ultimately the patient understands his situation and is free to make his own decisions.  She asks that I update her following my conversation with Mr. Stainback.  I introduced Palliative Medicine as specialized medical care for people living with serious illness. It focuses on providing relief from the symptoms and stress of a serious illness. The goal is to improve quality of life for both the patient and the family.  Mr. Milby reviews much of what his daughter told me.  Reviews that he is maintaining as much independence as possible but has lost some of this related to new oxygen requirement.  He tells me about his understanding of his heart failure and exacerbations that are leading to hospitalizations.  He understands a plan for thoracentesis in the coming days.  Mr. Racca clearly tells me that he would not want to be intubated.  He is also clear that he would not want CPR.  We discussed changing his CODE STATUS to DNR/DNI.  He agrees with this.  We discussed that he will still receive full medical care up to this point.  He agrees.  Patient's daughter-in-law present for conversation.  We discussed patient's current illness and what it means in the larger context of patient's on-going co-morbidities.  Natural disease trajectory and expectations at EOL were discussed.  Discussed with patient and family the importance of  continued conversation with family and the medical providers regarding overall plan of care and treatment options, ensuring decisions are within the context of the patients values and GOCs.    Questions and concerns were addressed. The family was encouraged to call with questions or concerns.  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 The following aspects are pertinent.  Past Medical History:  Diagnosis Date   Abnormal chest CT 03/28/2017   Allergy    Anemia    Angina pectoris ([place]) 04/02/2011   Aortic aneurysm ([place]) 03/28/2017   Saw Dr Genevive Bi 04/16/17 - recommended f/u CT in 3 months.     Atherosclerosis of both carotid arteries 07/17/2014   Basal cell carcinoma 06/25/2021   Left ant neck. Nodulocystic pattern, close to margin   Benign lipomatous neoplasm of skin and subcutaneous tissue of left arm 10/04/2013   Bilateral carotid artery stenosis 08/30/2014   Overview:  Less than 50% 2015   Bladder cancer ([place]) 03/05/2013   CAD (coronary artery disease) 06/18/2014   Cancer ([place]) 11/24/2012   bladder. BCG treatments by Dr Jacqlyn Larsen.   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 WD SCC. Re-shaved 01/14/2016. Excised 02/05/2016, margins free.   Squamous cell carcinoma of skin 11/03/2018   Right cheek ant. to sideburn. Poorly differentiated.   Squamous cell carcinoma of skin 02/02/2019   Right mid dorsum forearm. WD SCC. EDC.   Social History   Socioeconomic History   Marital status: Widowed    Spouse name: Not on file   Number of children: Not on file   Years of education: Not on file   Highest education level: Not on file  Occupational History   Not on file  Tobacco Use   Smoking status: Former    Types: Cigarettes    Quit date: 09/29/1958    Years since quitting: 63.9   Smokeless tobacco: Never  Vaping Use   Vaping Use: Never used  Substance and Sexual Activity   Alcohol use: Yes    Alcohol/week: 0.0 standard drinks of alcohol    Comment: occasionally  Drug use: No   Sexual activity: Never  Other Topics Concern   Not on file  Social History Narrative   Pt is married with 2 children - 1 son, 1 daughter. Previously self-employed in [employer]   Social Determinants of Health   Financial Resource Strain: [place]  (11/19/2021)   Overall Financial Resource Strain (CARDIA)    Difficulty of Paying Living Expenses: Not hard at all  Food Insecurity: No Food Insecurity (09/07/2022)   Hunger Vital Sign    Worried About Running Out of Food in the Last Year: Never true    Ran Out of Food in the Last Year: Never true  Transportation Needs: No Transportation Needs (09/07/2022)   PRAPARE - Hydrologist (Medical): No    Lack of Transportation (Non-Medical): No  Physical  Activity: Not on file  Stress: No Stress Concern Present (11/19/2021)   [place]    Feeling of Stress : Not at all  Social Connections: Unknown (11/19/2021)   Social Connection and Isolation Panel [NHANES]    Frequency of Communication with Friends and Family: More than three times a week    Frequency of Social Gatherings with Friends and Family: More than three times a week    Attends Religious Services: More than 4 times per year    Active Member of [employer] or Organizations: Not on file    Attends Archivist Meetings: Not on file    Marital Status: Widowed   Family History  Problem Relation Age of Onset   Lung cancer Sister    Prostate cancer Brother    Lung cancer Brother    Arthritis Other        parent   Colon cancer Neg Hx    Scheduled Meds:  aspirin EC  81 mg Oral Daily   Chlorhexidine Gluconate Cloth  6 each Topical Daily   enoxaparin (LOVENOX) injection  40 mg Subcutaneous Q24H   feeding supplement  237 mL Oral TID BM   finasteride  5 mg Oral Daily   furosemide  40 mg Intravenous BID   guaiFENesin  1,200 mg Oral BID   midodrine  5 mg Oral TID WC   mycophenolate  1,000 mg Oral Q12H   pyridostigmine  30 mg Oral 6 X Daily   tamsulosin  0.4 mg Oral Daily   Vitamin D (Ergocalciferol)  50,000 Units Oral Q7 days   Continuous Infusions:  iron sucrose Stopped (09/09/22 1300)   PRN Meds:.acetaminophen **OR** acetaminophen, ipratropium-albuterol, melatonin, ondansetron **OR** ondansetron (ZOFRAN) IV, mouth rinse Allergies  Allergen Reactions   Beta Adrenergic Blockers Other (See Comments)    Beta Blocker will precipitate acute weakness in Lambert-Eaton Syndrome or Myasthenia Gravis   Calcium Channel Blockers Other (See Comments)    Calcium Channel Blocker- will precipitate acute weakness in Lambert-Eaton Syndrome.   Diltiazem Other (See Comments)   Ciprofloxacin Other (See Comments)    Last resort  due to Myasthia Gravis   Sulfa Antibiotics Rash   Review of Systems  Constitutional:  Positive for activity change and fatigue.  Neurological:  Positive for weakness.    Physical Exam Constitutional:      General: He is not in acute distress.    Appearance: He is ill-appearing.     Comments: Sitting up in chair in no distress, alert and oriented  Pulmonary:     Effort: Pulmonary effort is normal.  Skin:    General:  Skin is warm and dry.  Neurological:     Mental Status: He is oriented to person, place, and time.  Psychiatric:        Mood and Affect: Mood normal.     Vital Signs: BP 100/62 (BP Location: Right Arm)   Pulse (!) 50   Temp 97.9 F (36.6 C) (Oral)   Resp 17   Ht _0  (1.778 m)   Wt 86.7 kg   SpO2 95%   BMI 27.43 kg/m  Pain Scale: 0-10   Pain Score: 0-No pain (denies)   SpO2: SpO2: 95 % O2 Device:SpO2: 95 % O2 Flow Rate: .O2 Flow Rate (L/min): 3 L/min  IO: Intake/output summary:  Intake/Output Summary (Last 24 hours) at 09/10/2022 1047 Last data filed at 09/10/2022 0900 Gross per 24 hour  Intake 275.69 ml  Output 1575 ml  Net -1299.31 ml    LBM: Last BM Date : 09/08/22 Baseline Weight: Weight: 81.7 kg Most recent weight: Weight: 86.7 kg     Palliative Assessment/Data: PPS 60%     *Please note that this is a verbal dictation therefore any spelling or grammatical errors are due to the "[place]" system interpretation.  Juel Burrow, DNP, AGNP-C Palliative Medicine Team [phone] Pager: [phone]

## 2022-09-10 NOTE — Evaluation (Signed)
Occupational Therapy Evaluation Patient Details Name: [name] MRN: [id number] DOB: 10-10-1925 Today's Date: 09/10/2022   History of Present Illness Pt is a 88 year old male presenting to the ED with hortness of breath, orthopnea and lower extremity edema, admitted with acute on chronic HFrEF.  Worsening bilateral pleural effusion; potential planned thoracentesis later this week; PMH significant for CAD, HTN, BPH, Lambert-Eaton myasthenic syndrome, systolic CHF (EF 25 to 41% 05/2022) on home O2 at 2L   Clinical Impression   Chart reviewed, pt cleared by nursing for participation in OT evaluation. Co tx completed for mobility portion of evaluation. Pt is alert and oriented x4, agreeable to evaluation. PTA pt is generally MOD I in ADL, increased assist for IADL since previous hospitalization. He amb in the hosue with SPC/ furniture walks. Pt presents with deficits in strength, endurance, activity tolerance, balance affecting safe and optimal ADL completion. LB dressing with MAX A, amb 200' in hallway with RW +2 for lines/leads with supervision-CGA, grooming tasks with SET UP in sitting. Recommend discharge home with HHOT to address functional deficits. Pt is left in bedside chair, all needs met. OT will continue to follow acutely.      Recommendations for follow up therapy are one component of a multi-disciplinary discharge planning process, led by the attending physician.  Recommendations may be updated based on patient status, additional functional criteria and insurance authorization.   Follow Up Recommendations  Home health OT     Assistance Recommended at Discharge Frequent or constant Supervision/Assistance  Patient can return home with the following A little help with walking and/or transfers;A little help with bathing/dressing/bathroom;Assistance with cooking/housework;Assist for transportation;Help with stairs or ramp for entrance    Functional Status Assessment  Patient has had  a recent decline in their functional status and demonstrates the ability to make significant improvements in function in a reasonable and predictable amount of time.  Equipment Recommendations  Other (comment) (pt has recommended equipment)    Recommendations for Other Services       Precautions / Restrictions Precautions Precautions: Fall Precaution Comments: watch o2 Restrictions Weight Bearing Restrictions: No      Mobility Bed Mobility               General bed mobility comments: NT in recliner pre/post session    Transfers Overall transfer level: Needs assistance Equipment used: Rolling walker (2 wheels) Transfers: Sit to/from Stand Sit to Stand: Min guard, Min assist (multiple attempts with RW)                  Balance Overall balance assessment: Needs assistance Sitting-balance support: Feet supported Sitting balance-Leahy Scale: Good     Standing balance support: Bilateral upper extremity supported, During functional activity, Reliant on assistive device for balance Standing balance-Leahy Scale: Fair                             ADL either performed or assessed with clinical judgement   ADL Overall ADL's : Needs assistance/impaired Eating/Feeding: Set up;Sitting   Grooming: Wash/dry hands;Wash/dry face;Sitting;Set up               Lower Body Dressing: Maximal assistance Lower Body Dressing Details (indicate cue type and reason): socks, compression stockings Toilet Transfer: Min guard;Rolling walker (2 wheels);Ambulation Toilet Transfer Details (indicate cue type and reason): simulated Toileting- Clothing Manipulation and Hygiene: Sit to/from stand;Moderate assistance       Functional mobility during ADLs:  Min guard;Rolling walker (2 wheels) (approx 200' with RW, +2 for lines/leads, with chair follow)       Vision Patient Visual Report: No change from baseline       Perception     Praxis      Pertinent Vitals/Pain  Pain Assessment Pain Assessment: No/denies pain     Hand Dominance     Extremity/Trunk Assessment Upper Extremity Assessment Upper Extremity Assessment: Generalized weakness   Lower Extremity Assessment Lower Extremity Assessment: Generalized weakness   Cervical / Trunk Assessment Cervical / Trunk Assessment: Kyphotic   Communication Communication Communication: No difficulties   Cognition Arousal/Alertness: Awake/alert Behavior During Therapy: WFL for tasks assessed/performed Overall Cognitive Status: Within Functional Limits for tasks assessed                                       General Comments  spo2 >89% on 3 L via [place] during mobility    Exercises Other Exercises Other Exercises: edu pt re: role of OT, role of rehab, discharge recommendations, home safety, falls prevention, DME use   Shoulder Instructions      Home Living Family/patient expects to be discharged to:: Private residence Living Arrangements: Alone Available Help at Discharge: Family;Available 24 hours/day (per pt family will be staying with him until he is ready to stay by himself) Type of Home: House Home Access: Stairs to enter [employer] of Steps: 6 Entrance Stairs-Rails: Right;Left Home Layout: One level     Bathroom Shower/Tub: Occupational psychologist: Standard     Home Equipment: Cane - single point;Grab bars - tub/shower;BSC/3in1;Rolling Environmental consultant (2 wheels);Shower seat;Wheelchair - manual          Prior Functioning/Environment Prior Level of Function : Independent/Modified Independent             Mobility Comments: furnature walks in house or use of SPC; use of RW just prior to presenting to hospital ADLs Comments: some assist for IADLs since previous hospitalization, generally MOD I with ADLs        OT Problem List: Decreased strength;Decreased activity tolerance;Impaired balance (sitting and/or standing);Decreased knowledge of use of DME  or AE      OT Treatment/Interventions: Self-care/ADL training;Patient/family education;Therapeutic exercise;Balance training;Energy conservation;Therapeutic activities;DME and/or AE instruction    OT Goals(Current goals can be found in the care plan section) Acute Rehab OT Goals Patient Stated Goal: go home OT Goal Formulation: With patient/family Time For Goal Achievement: 09/24/22 Potential to Achieve Goals: Good ADL Goals Pt Will Perform Grooming: with modified independence;sitting;standing Pt Will Perform Lower Body Dressing: with modified independence;sit to/from stand;sitting/lateral leans Pt Will Transfer to Toilet: with modified independence;ambulating Pt Will Perform Toileting - Clothing Manipulation and hygiene: with modified independence;sitting/lateral leans;sit to/from stand  OT Frequency: Min 2X/week    Co-evaluation PT/OT/SLP Co-Evaluation/Treatment: Yes Reason for Co-Treatment: Complexity of the patient's impairments (multi-system involvement);To address functional/ADL transfers   OT goals addressed during session: ADL's and self-care      AM-PAC OT "6 Clicks" Daily Activity     Outcome Measure Help from another person eating meals?: None Help from another person taking care of personal grooming?: None Help from another person toileting, which includes using toliet, bedpan, or urinal?: A Lot Help from another person bathing (including washing, rinsing, drying)?: A Lot Help from another person to put on and taking off regular upper body clothing?: A Little Help from another person to  put on and taking off regular lower body clothing?: A Lot 6 Click Score: 17   End of Session Equipment Utilized During Treatment: Rolling walker (2 wheels);Oxygen  Activity Tolerance: Patient tolerated treatment well Patient left: in chair;with call bell/phone within reach;with family/visitor present  OT Visit Diagnosis: Unsteadiness on feet (R26.81);Muscle weakness (generalized)  (M62.81)                Time: 1031-2811 OT Time Calculation (min): 33 min Charges:  OT General Charges $OT Visit: 1 Visit OT Evaluation $OT Eval Moderate Complexity: 1 Mod  Shanon Payor, OTD OTR/L  09/10/22, 3:32 PM

## 2022-09-11 ENCOUNTER — Inpatient Hospital Stay: Payer: Medicare Other

## 2022-09-11 ENCOUNTER — Encounter: Payer: Medicare Other | Admitting: Occupational Therapy

## 2022-09-11 DIAGNOSIS — I5023 Acute on chronic systolic (congestive) heart failure: Secondary | ICD-10-CM

## 2022-09-11 LAB — BASIC METABOLIC PANEL
Anion gap: 9 (ref 5–15)
BUN: 42 mg/dL — ABNORMAL HIGH (ref 8–23)
CO2: 35 mmol/L — ABNORMAL HIGH (ref 22–32)
Calcium: 8.7 mg/dL — ABNORMAL LOW (ref 8.9–10.3)
Chloride: 97 mmol/L — ABNORMAL LOW (ref 98–111)
Creatinine, Ser: 0.9 mg/dL (ref 0.61–1.24)
GFR, Estimated: >60 mL/min (ref >60)
Glucose, Bld: 115 mg/dL — ABNORMAL HIGH (ref 70–99)
Potassium: 3.3 mmol/L — ABNORMAL LOW (ref 3.5–5.1)
Sodium: 141 mmol/L (ref 135–145)

## 2022-09-11 LAB — CBC
HCT: 36.7 % — ABNORMAL LOW (ref 39.0–52.0)
Hemoglobin: 10.9 g/dL — ABNORMAL LOW (ref 13.0–17.0)
MCH: 28.4 pg (ref 26.0–34.0)
MCHC: 29.7 g/dL — ABNORMAL LOW (ref 30.0–36.0)
MCV: 95.6 fL (ref 80.0–100.0)
Platelets: 173 10*3/uL (ref 150–400)
RBC: 3.84 MIL/uL — ABNORMAL LOW (ref 4.22–5.81)
RDW: 15.9 % — ABNORMAL HIGH (ref 11.5–15.5)
WBC: 6.1 10*3/uL (ref 4.0–10.5)
nRBC: 0 % (ref 0.0–0.2)

## 2022-09-11 MED ORDER — POTASSIUM CHLORIDE 20 MEQ PO PACK
60.0000 meq | PACK | Freq: Once | ORAL | Status: AC
  Administered 2022-09-11: 60 meq via ORAL
  Filled 2022-09-11: qty 3

## 2022-09-11 NOTE — Progress Notes (Signed)
Micro ICU [facility])  Hospitalized hospice liaison note  Request received from TOC/PMT for hospice services at home. Will follow up with patient/family tomorrow morning for explanation of services. Provider aware  Thank you for the opportunity to participate in this patient's care [name], BSN, RN [phone]

## 2022-09-11 NOTE — Progress Notes (Signed)
[facility] Progress Note  Patient: [name]    XNA:[id number]  DOA: 09/06/2022     Date of Service: the patient was seen and examined on 09/11/2022  Chief Complaint  Patient presents with   Shortness of Breath   Brief hospital course:  [name] is a 88 y.o. male with medical history significant for CAD, HTN, BPH, Lambert-Eaton myasthenic syndrome, systolic CHF (EF 25 to 54% 05/2022) on home O2 at 2L, admitted twice in the past 3 months for CHF exacerbation and respiratory failure requiring BiPAP, most recently from 11/6 to 11/9 who presents to the ED with shortness of breath, orthopnea and lower extremity edema similar to recent prior exacerbations. In the past week he had to go up on hte oxygen to 3.5 from 2L. On his admission in September he was also diagnosed with NSTEMI.  At the present time patient denies chest pain, cough, fever or chills.  Denies leg pain.  On arrival of EMS patient had increased work of breathing and was initially placed on NRB at 15 L ED course and data review: BP 107/74 with pulse 62.  Afebrile with O2 sat of 100% on 4 L.  Troponin 121-130 with BNP 2080.Marland Kitchen  CBC significant for hemoglobin 11.2, down from 12.4.Creatinine 1.49 up from 0.93 about 4 weeks prior EKG, personally viewed and interpreted showing NSR at 68 with LVH and nonischemic ST-T wave changes chest x-ray showed CHF and possible pneumonia as follows: IMPRESSION: Cardiomegaly with vascular congestion, pulmonary edema and small to moderate bilateral effusions. Basilar airspace disease may be due to atelectasis or pneumonia.   12/12: Initially admitted for acute on chronic HFrEF.  Worsening bilateral pleural effusion.  Critical care on board, his home Plavix was placed on hold and most likely will get his thoracentesis later this week. Neurology was also consulted to rule out myasthenia crisis due to worsening NIF which is now improving.  Less likely a myasthenia crisis and no IVIG needed at  this time.  See neurology note in detail. Currently seems stable at his baseline oxygen requirement of 2 to 3 L.  Unable to tolerate BiPAP mask, pulmonology is recommending using nasal prongs to continue BiPAP at night as it is improving NIF.  Patient is very high risk for deterioration and mortality based on age and underlying significant comorbidities.  Currently full code.  Palliative care was also consulted to discuss goals of care.  12/13: Remained hemodynamically stable and muscle strength continue to improve.  Discussed with patient and daughter-in-law about the CODE STATUS, patient wants to be DNR and DNI, apparently his daughter insisted on changing him to full code.  Patient is alert and oriented and can make his own decisions. CODE STATUS changed to DNR.  Pending rest of the goals of care discussion with palliative care today. Patient would like to go back home with home health along with son and DIL help. PT/OT evaluation ordered.  12/14: Patient was stable during morning rounds, walking around the nursing station.  Repeat chest x-ray with improvement in aeration and pleural effusion.  Pulmonology decided not to do thoracentesis.  He will still go home.  Later became more tachypneic and disoriented.  Palliative care revisited as family now would like to go home with hospice.  Renal functions continue to improve with mild hypokalemia which is being repleted.  Assessment and Plan:  # Acute on chronic systolic CHF (congestive heart failure) (HCC) Bilateral pleural effusion Acute on chronic respiratory failure with hypoxia Patient  had increased work of breathing requiring 15 L NRB with EMS, now down to 3 L.  On 3.5 L at baseline BNP over 2000, CXR showing "cardiomegaly with vascular congestion, pulmonary edema and small to moderate bilateral effusions. Basilar airspace disease may be due to atelectasis or pneumonia". IV Lasix.  Not currently on beta-blocker, possibly secondary to his  myasthenia.  Not on ARB Initially declines IR consult for consideration of thoracentesis, now Plavix is being held by pulmonary and they are planning to do thoracentesis after washout, most likely towards the end of week. Daily weights with intake and output monitoring EF in September 2023 showed EF 25 to 30% Continue Lasix 40 mg IV twice daily.  Lambert-Eaton myasthenic syndrome (HCC) Continue pyridostigmine and CellCept 12/11 c/o worsening of shortness of breath, could be exacerbation of myasthenia gravis NIF -30 cm H2O today, clinically seems improving with improvement in his strength, per pulmonary note no need to further check NIF. Per neurology note in myasthenia crisis at this time so no need for IVIG, NIF improving. Patient need to have a close follow-up with outpatient neurology to consider restarting Vyvgart  They also placed a list of medications to be avoided in myasthenia gravis, list was placed on discharge instructions.  # AKI (acute kidney injury) ([place]).  Resolved.  Cr 1.39--1.4--1.23 --1.26>>1.15>>1.04>>0.90 -Current with IV diuresis    # Coronary artery disease involving native coronary artery of native heart without angina pectoris Elevated troponin, with history of NSTEMI 05/2022 Troponin 121-130, almost flat, suspect secondary to demand ischemia.  Patient denies chest pain and EKG nonacute  Troponin peaked at above 8000 for NSTEMI September 23 Continue aspirin and Plavix-currently on hold for thoracentesis in a day. Can consider repeat echo to evaluate for wall motion abnormality Patient was seen by cardiology, recommended no ischemic workup at this time.    Essential hypertension 12/10 blood pressure dropped yesterday, patient received peripheral Levophed, critical care was consulted which was stopped after improvement in the blood pressure.  Patient received midodrine 10 mg p.o. one-time dose and started midodrine 5 mg p.o. 3 times daily. Currently blood pressure  within lower normal limit. -Continue with midodrine -Continue to monitor   History of bladder cancer.  S/p  BCG BPH Continue finasteride and tamsulosin    Leg cramps, most likely due to iron deficiency Iron level 19, saturation ratio 6%, started Venofer 300 mg IV daily x 3 doses Folic acid 8.0, N62 level 449 within normal range and vitamin D 30 at lower end, started oral supplement. -Continue iron supplement  Body mass index is 25.83 kg/m.        Diet: Heart healthy DVT Prophylaxis: Subcutaneous Lovenox   Advance goals of care discussion: DNR  Family Communication: Discussed with son at bedside  Disposition: Not decided to go home with hospice   Subjective: Patient was sitting in chair and taking a nap when seen during morning rounds.  Easily arousable, stating that he is tired after eating breakfast.  Before that he was walking around the nursing desk.  Physical Exam: General.  Frail elderly man, in no acute distress. Pulmonary.  Lungs clear mostly, normal respiratory effort.  Decreased breath sounds at bases with few crackles CV.  Regular rate and rhythm, no JVD, rub or murmur. Abdomen.  Soft, nontender, nondistended, BS positive. CNS.  Alert and oriented .  No focal neurologic deficit. Extremities.  No edema, no cyanosis, pulses intact and symmetrical. Psychiatry.  Judgment and insight appears normal.   Vitals:  09/11/22 0400 09/11/22 0500 09/11/22 0600 09/11/22 1000  BP: (!) 93/55 (!) 111/97 (!) 106/46 (!) 129/57  Pulse: (!) 41 (!) 40 (!) 40 (!) 53  Resp: '14 18 13 17  '$ Temp: 97.6 F (36.4 C)   98.2 F (36.8 C)  TempSrc: Axillary   Oral  SpO2: 99% 96% 98% 98%  Weight:      Height:        Intake/Output Summary (Last 24 hours) at 09/11/2022 1443 Last data filed at 09/11/2022 0600 Gross per 24 hour  Intake 380 ml  Output 1225 ml  Net -845 ml    Filed Weights   09/05/22 2255 09/08/22 1900 09/09/22 0500  Weight: 81.7 kg 89 kg 86.7 kg    Data  Reviewed: Prior data reviewed  CBC: Recent Labs  Lab 09/07/22 0630 09/08/22 0607 09/09/22 0502 09/10/22 0259 09/11/22 0417  WBC 6.1 5.1 5.5 5.4 6.1  HGB 10.8* 11.0* 11.1* 11.1* 10.9*  HCT 36.7* 36.7* 37.0* 38.4* 36.7*  MCV 95.6 94.8 96.9 96.7 95.6  PLT 188 184 152 170 102    Basic Metabolic Panel: Recent Labs  Lab 09/06/22 1410 09/07/22 0630 09/08/22 0607 09/09/22 0502 09/10/22 0259 09/11/22 0417  NA 140 141 141 141 143 141  K 4.2 3.5 3.7 3.5 3.7 3.3*  CL 99 100 97* 98 100 97*  CO2 34* 33* 34* 37* 36* 35*  GLUCOSE 134* 119* 104* 123* 119* 115*  BUN 50* 54* 54* 51* 48* 42*  CREATININE 1.40* 1.23 1.26* 1.15 1.04 0.90  CALCIUM 8.8* 8.7* 8.7* 8.4* 8.8* 8.7*  MG 2.3 2.4 2.4 2.3  --   --   PHOS 3.6 3.7 3.4 3.6  --   --      Studies: DG Chest 2 View  Result Date: 09/11/2022 CLINICAL DATA:  Pleural effusion EXAM: CHEST - 2 VIEW COMPARISON:  None Available. FINDINGS: Normal cardiac silhouette. Bilateral pleural effusions. Improvement in bilateral lower lobe airspace disease compared to prior. Upper lungs clear. No pneumothorax. No acute osseous abnormality. IMPRESSION: Bilateral pleural effusions. Improvement in lower lobe airspace disease seen on prior. Electronically Signed   By: Suzy Bouchard M.D.   On: 09/11/2022 11:38    Scheduled Meds:  aspirin EC  81 mg Oral Daily   Chlorhexidine Gluconate Cloth  6 each Topical Daily   enoxaparin (LOVENOX) injection  40 mg Subcutaneous Q24H   feeding supplement  237 mL Oral TID BM   finasteride  5 mg Oral Daily   furosemide  40 mg Intravenous BID   guaiFENesin  1,200 mg Oral BID   midodrine  5 mg Oral TID WC   mycophenolate  1,000 mg Oral Q12H   pyridostigmine  30 mg Oral 6 X Daily   tamsulosin  0.4 mg Oral Daily   Vitamin D (Ergocalciferol)  50,000 Units Oral Q7 days   Continuous Infusions:   PRN Meds: acetaminophen **OR** acetaminophen, ipratropium-albuterol, ondansetron **OR** ondansetron (ZOFRAN) IV, mouth  rinse  Time spent: 44 minutes  This record has been created using Systems analyst. Errors have been sought and corrected,but may not always be located. Such creation errors do not reflect on the standard of care.   Author: Lorella Nimrod. MD [facility] 09/11/2022 2:43 PM  To reach On-call, see care teams to locate the attending and reach out to them via [email or web address]. If 7PM-7AM, please contact night-coverage If you still have difficulty reaching the attending provider, please page the [facility] (Director on Call) for [facility] on [employer]  for assistance.

## 2022-09-11 NOTE — Progress Notes (Signed)
NIF -21, performed with good effort. NARD or SOB noted at this time.

## 2022-09-11 NOTE — TOC Progression Note (Signed)
Transition of Care [facility]) - Progression Note    Patient Details  Name: [name] MRN: [id number] Date of Birth: September 18, 1926  Transition of Care [facility]) CM/SW Contact  Shelbie Hutching, RN Phone Number: 09/11/2022, 2:40 PM  Clinical Narrative:    Patient and family have decided on hospice at home.  Met with patient at the bedside.  Patient reports that he has oxygen at home, hospital bed, walker, wheelchair, no DME needs.  Family wants [facility], [name] Given referral for home with hospice.    Expected Discharge Plan: Home w Hospice Care Barriers to Discharge: Continued Medical Work up  Expected Discharge Plan and Services Expected Discharge Plan: [place]   Discharge Planning Services: CM Consult Post Acute Care Choice: Hospice Living arrangements for the past 2 months: Single Family Home                                       Social Determinants of Health (SDOH) Interventions    Readmission Risk Interventions     No data to display

## 2022-09-11 NOTE — Progress Notes (Signed)
Daily Progress Note   Patient Name: [name]       Date: 09/11/2022 DOB: [birth year]  Age: 88 y.o. MRN#: [id number] Attending Physician: Joel Nimrod, MD Primary Care Physician: Joel Pheasant, MD Admit Date: 09/06/2022  Reason for Consultation/Follow-up: Establishing goals of care  Subjective: Feeling worse - weaker  Length of Stay: 5  Current Medications: Scheduled Meds:   aspirin EC  81 mg Oral Daily   Chlorhexidine Gluconate Cloth  6 each Topical Daily   enoxaparin (LOVENOX) injection  40 mg Subcutaneous Q24H   feeding supplement  237 mL Oral TID BM   finasteride  5 mg Oral Daily   furosemide  40 mg Intravenous BID   guaiFENesin  1,200 mg Oral BID   midodrine  5 mg Oral TID WC   mycophenolate  1,000 mg Oral Q12H   pyridostigmine  30 mg Oral 6 X Daily   tamsulosin  0.4 mg Oral Daily   Vitamin D (Ergocalciferol)  50,000 Units Oral Q7 days    Continuous Infusions:   PRN Meds: acetaminophen **OR** acetaminophen, ipratropium-albuterol, ondansetron **OR** ondansetron (ZOFRAN) IV, mouth rinse  Physical Exam Constitutional:      General: He is not in acute distress.    Appearance: He is ill-appearing.     Comments: Drowsy but wakes to voice and can answer questions appropriately  Pulmonary:     Effort: Pulmonary effort is normal.  Skin:    General: Skin is warm and dry.  Neurological:     Mental Status: He is oriented to person, place, and time.     Comments: Intermittent confusion reported by family at bedside             Vital Signs: BP (!) 129/57   Pulse (!) 53   Temp 98.2 F (36.8 C) (Oral)   Resp 17   Ht '5\' 10"'$  (1.778 m)   Wt 86.7 kg   SpO2 98%   BMI 27.43 kg/m  SpO2: SpO2: 98 % O2 Device: O2 Device: Nasal Cannula O2 Flow Rate: O2 Flow Rate (L/min): 3  L/min  Intake/output summary:  Intake/Output Summary (Last 24 hours) at 09/11/2022 1455 Last data filed at 09/11/2022 0600 Gross per 24 hour  Intake 380 ml  Output 1225 ml  Net -845 ml   LBM: Last BM Date : 09/08/22 Baseline Weight: Weight: 81.7 kg Most recent weight: Weight: 86.7 kg       Palliative Assessment/Data: PPS 50%      Patient Active Problem List   Diagnosis Date Noted   CHF exacerbation ([place]) 09/06/2022   Elevated troponin 09/06/2022   AKI (acute kidney injury) ([place]) 09/06/2022   Dyslipidemia 08/05/2022   Acute respiratory failure with hypoxia ([place]) 08/05/2022   Acute on chronic systolic CHF (congestive heart failure) ([place]) 08/04/2022   NSTEMI (non-ST elevated myocardial infarction) ([place])    Acute on chronic respiratory failure with hypoxia and hypercapnia ([place]) 06/14/2022   Stasis dermatitis 05/24/2022   Acute on chronic combined systolic and diastolic CHF (congestive heart failure) ([place]) 09/28/2021   Benign prostatic hyperplasia 09/28/2021   Rectal bleeding 02/24/2021   Rectal irritation 11/25/2020   Lymphedema 11/19/2020   Pressure injury of right buttock, stage 2 (  [place]) 04/23/2020   Lower limb ulcer, calf, left, limited to breakdown of skin ([place]) 12/27/2019   Dysuria 06/30/2019   Hyperglycemia 06/30/2019   LVH (left ventricular hypertrophy) due to hypertensive disease, without heart failure 05/26/2019   Pseudophakia, left eye 11/26/2018   Venous insufficiency of both lower extremities 10/13/2018   Swelling of limb 10/12/2018   Bilateral lower extremity edema 10/10/2018   Absent pulse in lower extremity 10/10/2018   Corneal thinning of right eye 07/17/2018   Eye pain 05/31/2018   Mild aortic valve stenosis 12/03/2017   Epididymoorchitis 09/02/2017   Hydrocele, right 08/11/2017   Aortic aneurysm ([place]) 03/28/2017   Abnormal chest CT 03/28/2017   Acute on chronic hypoxic respiratory failure ([place]) 12/27/2016   Hypotension    Dizziness 12/22/2016    Leg weakness 03/11/2016   Skin lesion 09/11/2015   Moderate mitral insufficiency 05/18/2015   Moderate tricuspid insufficiency 05/18/2015   Essential hypertension 04/25/2015   Rash 03/10/2015   Health care maintenance 03/10/2015   Bilateral carotid artery stenosis 08/30/2014   Atherosclerosis of both carotid arteries 29/51/8841   Chronic systolic CHF (congestive heart failure), NYHA class 3 ([place]) 07/17/2014   Coronary artery disease involving native coronary artery of native heart without angina pectoris 06/18/2014   CHF (congestive heart failure) ([place]) 04/20/2014   Congestive heart failure ([place]) 04/20/2014   SOB (shortness of breath) 03/10/2014   Abnormal EKG 03/10/2014   Mixed hyperlipidemia 03/10/2014   Benign lipomatous neoplasm of skin and subcutaneous tissue of left arm 10/04/2013   Arm skin lesion, left 09/04/2013   History of shingles 09/04/2013   History of bladder cancer 03/05/2013   Chronic prostatitis 09/30/2012   Elevated prostate specific antigen (PSA) 09/30/2012   Family history of malignant neoplasm of prostate 09/30/2012   Gross hematuria 09/30/2012   Incomplete emptying of bladder 09/30/2012   Anemia 08/30/2012   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 male  with past medical history of CAD, HTN, BPH, Lambert-Eaton myasthenic syndrome, systolic CHF (EF 25 to 66% 05/2022) on home O2 at 2L, admitted twice in the past 3 months for CHF exacerbation and respiratory failure requiring BiPAP  admitted on 09/06/2022 with shortness of breath, orthopnea and lower extremity edema similar to recent prior exacerbations.  Patient diagnosed with acute on chronic CHF.  Found to have worsening bilateral pleural effusions.  PMT consulted to discuss goals of care.    Assessment: Saw patient this morning sitting up in chair, reported he felt well.  Patient walked around the unit yesterday with physical therapy.  Per PT, he did walk around today but was a little slower and more cautious today.  Throughout the day patient with some decline -increased weakness, some tachypnea, and some intermittent confusion. Discussed with patient's son at bedside who worries about the support they will have at home.  We discussed the option of hospice and son agrees this would be appropriate.  Spoke to patient's daughter [name] via phone and after thorough discussion of patient's hospitalization she also agrees that it is appropriate to go ahead and arrange hospice support at home.  We discussed that hospice does not have to be permanent and if he were to go home and perk up they can always revoke hospice services.  Daughter expresses concern that patient is just given up  because he is depressed.  We discussed concept of quality of life and everybody has a limit of what they can tolerate and what they consider good quality of life.  We discussed that maybe [name] has reached the point that aggressive medical care is not gaining him the quality of life he would want.  She tells me she will be coming to see the patient this evening and would like [place] to continue current care for now but if he remains in current state tomorrow she will agree to hospice.  Okay to go ahead and consult them to arrange for services.  Recommendations/Plan: Arrange hospice support in anticipation of discharge -referral given to [name] who will reach out to family in morning CODE STATUS DNR/DNI  If patient with further decline through the night and tomorrow may need to consider hospice facility  Code Status: DNR   Discharge Planning: Home with [place] was discussed with patient, son, daughter, Dr. Lake Bells, [facility], hospice liaison, Dr. Reesa Chew  Thank you for allowing the Palliative Medicine  Team to assist in the care of this patient.    *Please note that this is a verbal dictation therefore any spelling or grammatical errors are due to the "[employer]" system interpretation.  Juel Burrow, DNP, [facility] Palliative Medicine Team Team Phone # [phone]  Pager [phone]

## 2022-09-11 NOTE — Progress Notes (Signed)
NAME:  [name], MRN:  [id number], DOB:  1925/10/09, LOS: 5 ADMISSION DATE:  09/06/2022, CONSULTATION DATE:  12/11 REFERRING MD:  Dwyane Dee, CHIEF COMPLAINT:  "don't feel right"   History of Present Illness:  88 year old male with chronic systolic heart failure admitted for a complication of the same.  PCCM consulted for hypercarbic respiratory failure, declining NIF measurements.    Pertinent  Medical History   Past Medical History:  Diagnosis Date   Abnormal chest CT 03/28/2017   Allergy    Anemia    Angina pectoris ([place]) 04/02/2011   Aortic aneurysm ([place]) 03/28/2017   Saw Dr Genevive Bi 04/16/17 - recommended f/u CT in 3 months.     Atherosclerosis of both carotid arteries 07/17/2014   Basal cell carcinoma 06/25/2021   Left ant neck. Nodulocystic pattern, close to margin   Benign lipomatous neoplasm of skin and subcutaneous tissue of left arm 10/04/2013   Bilateral carotid artery stenosis 08/30/2014   Overview:  Less than 50% 2015   Bladder cancer ([place]) 03/05/2013   CAD (coronary artery disease) 06/18/2014   Cancer ([place]) 11/24/2012   bladder. BCG treatments by Dr Jacqlyn Larsen.   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 WD SCC. Re-shaved  01/14/2016. Excised 02/05/2016, margins free.   Squamous cell carcinoma of skin 11/03/2018   Right cheek ant. to sideburn. Poorly differentiated.   Squamous cell carcinoma of skin 02/02/2019   Right mid dorsum forearm. WD SCC. EDC.     Significant Hospital Events: Including procedures, antibiotic start and stop dates in addition to other pertinent events   September 2023 TTE showed LVEF 25 to 30% September 08, 2022 chest x-ray showed bilateral pleural effusions, layering, bibasilar atelectasis, started BIPAP at night, plavix held  Interim History / Subjective:   Feels about the same today Didn't sleep with BIPAP  Objective   Blood pressure (!) 106/46, pulse (!) 40, temperature 97.6 F (36.4 C), temperature source Axillary, resp. rate 13, height '5\' 10"'$  (1.778 m), weight 86.7 kg, SpO2 98 %.        Intake/Output Summary (Last 24 hours) at 09/11/2022 0724 Last data filed at 09/11/2022 0600 Gross per 24 hour  Intake 1109.13 ml  Output 1925 ml  Net -815.87 ml   Filed Weights   09/05/22 2255 09/08/22 1900 09/09/22 0500  Weight: 81.7 kg 89 kg 86.7 kg    Examination:  General:  Chronically ill appearing, resting comfortably in chair HENT: NCAT OP clear PULM: Crackles bases B, normal effort CV: RRR, no mgr GI: BS+, soft, nontender MSK: normal bulk and tone Derm: massive leg edema Neuro: awake, alert, no distress, MAEW    Resolved Hospital Problem list     Assessment & Plan:  Chronic respiratory  failure with hypercarbia/hypoxemia LE Myasthenia gravis, no clear evidence of flare Chronic systolic heart failure, acute exacerbation Shortness of breath Bilateral pleural effusions due to heart failure  Discussion: Overall he's had a decline over the last 6-12 months, now admitted with acute exacerbation of CHF.  Stable today compared to yesterday.  NIFs are low, but don't seem to correlate with other neurologic findings.  He says that he is willing to have a thoracentesis if it  would help.  I explained that it could provide some temporary relief of dyspnea but his effusions will most likely recur.  Given his advanced age it's best to wait for plavix washout prior to attempting therapeutic thora.  Plan: Hold off on BIPAP CXR 2 view Will order thoracentesis for 12/15 if effusions unchanged Flutter valve Guaifenesin Diuresis as able Consider stopping NIF measurements  CODE STATUS: DNR  Appreciate palliative care and neuro support  Best Practice (right click and "Reselect all SmartList Selections" daily)   Per HPI  Critical care time: n/a    Roselie Awkward, MD [place] PCCM Pager: [phone] Cell: [phone] After 7:00 pm call Elink  [phone]

## 2022-09-11 NOTE — Progress Notes (Addendum)
Physical Therapy Treatment Patient Details Name: [name] MRN: [id number] DOB: [birth year] Today's Date: 09/11/2022   History of Present Illness 88 year old male presenting to the ED with shortness of breath, orthopnea and lower extremity edema, admitted with acute on chronic HFrEF.  Worsening bilateral pleural effusion; potential planned thoracentesis; PMH significant for CAD, HTN, BPH, Lambert-Eaton myasthenic syndrome, systolic CHF (EF 25 to 40% 05/2022) on home O2 at 2L    PT Comments    Pt reports he is feeling about the same as yesterday, in recliner on arrival with SpO2 in the high 90s on 3L and HR in the 70.  HR did increase to ~90 during ambulation (SpO2 remained in the 90s t/o) though he did not endorse excessive fatigue he did walk a little slower and more cautiously today - still not requiring rest breaks or physical assist.  Pt overall did well, able to get himself back up into bed after ambulation.  Continue PT, will benefit from trial on the steps next visit to simulate entry to the home.  Recommendations for follow up therapy are one component of a multi-disciplinary discharge planning process, led by the attending physician.  Recommendations may be updated based on patient status, additional functional criteria and insurance authorization.  Follow Up Recommendations  Home health PT     Assistance Recommended at Discharge Intermittent Supervision/Assistance  Patient can return home with the following A little help with bathing/dressing/bathroom;Assistance with cooking/housework;Help with stairs or ramp for entrance   Equipment Recommendations  None recommended by PT    Recommendations for Other Services       Precautions / Restrictions Precautions Precautions: Fall Restrictions Weight Bearing Restrictions: No     Mobility  Bed Mobility Overal bed mobility: Modified Independent             General bed mobility comments: in recliner at start of session,  able to get himself back into bed w/o asssist post session    Transfers Overall transfer level: Needs assistance Equipment used: Rolling walker (2 wheels) Transfers: Sit to/from Stand Sit to Stand: Min guard           General transfer comment: cuing to insure appropriate set up, did not need direct physical assist    Ambulation/Gait Ambulation/Gait assistance: Supervision Gait Distance (Feet): 200 Feet Assistive device: Rolling walker (2 wheels)         General Gait Details: on 3L, O2 remained in the 90s (did drop from high to low 90s with the effort). He showed ability to maintain consistent cadence with no overt LOBs or safety issues.  He does have some b/l LE swelling and limited foot clearance but reports this as near baseline.  He does not normally need RW for support, despite light cuing does not feel ready to trial with single UE/cane.   Stairs             Wheelchair Mobility    Modified Rankin (Stroke Patients Only)       Balance Overall balance assessment: Needs assistance Sitting-balance support: Feet supported Sitting balance-Leahy Scale: Good     Standing balance support: Bilateral upper extremity supported, During functional activity, Reliant on assistive device for balance Standing balance-Leahy Scale: Good Standing balance comment: Good confidence, light UE reliance, somewhat forward leaning on the walker w/o safety concnern                            Cognition Arousal/Alertness: Awake/alert  Behavior During Therapy: WFL for tasks assessed/performed Overall Cognitive Status: Within Functional Limits for tasks assessed                                          Exercises      General Comments General comments (skin integrity, edema, etc.): Pt to go down to x-ray, deferred further PT after successful ambulation bout      Pertinent Vitals/Pain Pain Assessment Pain Assessment: No/denies pain    Home Living                           Prior Function            PT Goals (current goals can now be found in the care plan section) Progress towards PT goals: Progressing toward goals    Frequency    Min 2X/week      PT Plan Current plan remains appropriate    Co-evaluation              AM-PAC PT "6 Clicks" Mobility   Outcome Measure  Help needed turning from your back to your side while in a flat bed without using bedrails?: A Little Help needed moving from lying on your back to sitting on the side of a flat bed without using bedrails?: A Little Help needed moving to and from a bed to a chair (including a wheelchair)?: A Little Help needed standing up from a chair using your arms (e.g., wheelchair or bedside chair)?: A Little Help needed to walk in hospital room?: A Little Help needed climbing 3-5 steps with a railing? : A Little 6 Click Score: 18    End of Session Equipment Utilized During Treatment: Gait belt;Oxygen (3L (baseline)) Activity Tolerance: Patient tolerated treatment well Patient left: in chair;with call bell/phone within reach;with family/visitor present Nurse Communication: Mobility status PT Visit Diagnosis: Muscle weakness (generalized) (M62.81);Difficulty in walking, not elsewhere classified (R26.2)     Time: 9371-6967 PT Time Calculation (min) (ACUTE ONLY): 17 min  Charges:  $Gait Training: 8-22 mins                     Kreg Shropshire, DPT 09/11/2022, 11:24 AM

## 2022-09-11 NOTE — Progress Notes (Signed)
[facility] PCCM  Came back to review CXR results with [name] and his son [name].  On exam Tachypneic, confused, speaking in shallow, few word sentences  CXR shows some improvement in his air space disease, effusions not worse  Discussed with his son, he would like to have hospice arranged, will discuss with primary service and primary  Roselie Awkward, MD [place] PCCM Pager: [phone] Cell: [phone] After 7:00 pm call Elink  [phone]

## 2022-09-11 NOTE — Congregational Nurse Program (Signed)
NIF -18 WITH GOOD EFFORT SITTING AT BEDSIDE IN [place].

## 2022-09-11 NOTE — Progress Notes (Signed)
[facility] PCCM  Moving towards hospice No plans for thoracentesis at this time PCCM will sign off, call if questions  Roselie Awkward, MD [place] PCCM Pager: [phone] Cell: [phone] After 7:00 pm call Elink  [phone]

## 2022-09-12 DIAGNOSIS — I5023 Acute on chronic systolic (congestive) heart failure: Secondary | ICD-10-CM | POA: Diagnosis not present

## 2022-09-12 MED ORDER — MIDODRINE HCL 5 MG PO TABS: 5.0000 mg | ORAL_TABLET | Freq: Three times a day (TID) | ORAL | 1 refills | Status: DC

## 2022-09-12 MED ORDER — TORSEMIDE 20 MG PO TABS: 40.0000 mg | ORAL_TABLET | Freq: Every day | ORAL | 1 refills | Status: DC

## 2022-09-12 MED ORDER — ENSURE ENLIVE PO LIQD: 237.0000 mL | Freq: Three times a day (TID) | ORAL | 12 refills | Status: DC

## 2022-09-12 MED ORDER — POTASSIUM CHLORIDE CRYS ER 20 MEQ PO TBCR
40.0000 meq | EXTENDED_RELEASE_TABLET | Freq: Once | ORAL | Status: AC
  Administered 2022-09-12: 40 meq via ORAL
  Filled 2022-09-12: qty 2

## 2022-09-12 MED ORDER — VITAMIN D (ERGOCALCIFEROL) 1.25 MG (50000 UNIT) PO CAPS: 50000.0000 [IU] | ORAL_CAPSULE | ORAL | 0 refills | Status: DC

## 2022-09-12 NOTE — Consult Note (Signed)
   [facility] Inpatient Consult   09/12/2022  [name] 03-24-1926 [id number]  [facility] [ACO] Patient: [place] Medicare  Primary Care Provider:  [name], MD, [place] [facility]  Chart reviewed and reveals the patient is currently transitioning to Hospice/Palliative Care at home.  Plan: Patient will have full case management services through Hospice and needs will be met at the hospice level of care. No [facility] Care Management is planned for transitional needs. Patient's electronic medical record was reviewed for inpatient [facility] team notes and Palliative consult note. Patient/family chose [facility] which will provide full care coordination needs for transition.  Will sign off at transition from hospital.  For questions,   Natividad Brood, RN BSN [place]  [phone] business mobile phone Toll free office [phone]  *[place]  [phone] Fax number: [phone] [email or web address]

## 2022-09-12 NOTE — Plan of Care (Signed)
Discharge teaching completed with patient and daughter, oxygen tank arrived, patient dressing and placed on oxygen to discharge home with, patient is in stable condition and discharged to home.

## 2022-09-12 NOTE — Progress Notes (Signed)
TOC contacted regarding patient need for oxygen tank to discharge home.  TOC making arrangements to have tank sent to floor.

## 2022-09-12 NOTE — TOC Transition Note (Signed)
Transition of Care [facility]) - CM/SW Discharge Note   Patient Details  Name: STEFFON GLADU MRN: [id number] Date of Birth: 08-31-1926  Transition of Care [facility]) CM/SW Contact:  Shelbie Hutching, RN Phone Number: 09/12/2022, 12:52 PM   Clinical Narrative:    Patient discharging home with [facility].  Patient already has oxygen at home but he needs an oxygen tank to go home with.  RNCM messaged [employer] they will take a tank up to the patient's room for him to go home with.  Family is transporting.     Final next level of care: Home w Hospice Care Barriers to Discharge: Barriers Resolved   Patient Goals and CMS Choice Patient states their goals for this hospitalization and ongoing recovery are:: to go home CMS Medicare.gov Compare Post Acute Care list provided to:: Patient Represenative (must comment) Choice offered to / list presented to : Adult Children  Discharge Placement                       Discharge Plan and Services   Discharge Planning Services: CM Consult Post Acute Care Choice: Hospice                               Social Determinants of Health (SDOH) Interventions     Readmission Risk Interventions     No data to display

## 2022-09-12 NOTE — Progress Notes (Signed)
Reached out to [facility], as oxygen tank has not been delivered to room for the patient.  TOC to check and find out approximately when oxygen tank will arrive.  Continuing to await for oxygen tank, family updated.

## 2022-09-12 NOTE — Progress Notes (Addendum)
[facility])  Hospital Liaison RN note  Received request from [facility] for hospice services at home after discharge. Chart and patient information under review by Hospice physician.   Spoke with patient and daughter at bedside to initiate education related to hospice philosophy, services, and team approach to care. Patient/family verbalized understanding of information given. Per discussion, the plan is for discharge home by family car today.   DME needs discussed. Patient has the following equipment in the home. Oxygen through [employer] along with various other equipment from family members.  Patient/family requests the following equipment for deliver: None  Please send signed and completed DNR with patient/family. Please provide symptoms at discharge as needed for ongoing symptom management.   [facility] information and contact numbers given to daughter [name]. Above information shared with [name].   Please call with any hospice related questions or concerns.  Thank you for the opportunity to participate in this patient's care.  [name], Therapist, sports, BSN [employer] [phone]

## 2022-09-12 NOTE — Progress Notes (Signed)
NIF -25, good effort , sitting in chair beside bed

## 2022-09-12 NOTE — Discharge Summary (Addendum)
Physician Discharge Summary   Patient: [name] MRN: [id number] DOB: 16-Jun-1926  Admit date:     09/06/2022  Discharge date: 09/12/22  Discharge Physician: Lorella Nimrod   PCP: [name], MD   Recommendations at discharge:  Follow-up with primary care doctor and cardiology  Discharge Diagnoses: Principal Problem:   Acute on chronic systolic CHF (congestive heart failure) ([place]) Active Problems:   Acute on chronic hypoxic respiratory failure (HCC)   Coronary artery disease involving native coronary artery of native heart without angina pectoris   Elevated troponin   AKI (acute kidney injury) ([place])   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 male with medical history significant for CAD, HTN, BPH, Lambert-Eaton myasthenic syndrome, systolic CHF (EF 25 to 24% 05/2022) on home O2 at 2L, admitted twice in the past 3 months for CHF exacerbation and respiratory failure requiring BiPAP, most recently from 11/6 to 11/9 who presents to the ED with shortness of breath, orthopnea and lower extremity edema similar to recent prior exacerbations. In the past week he had to go up on hte oxygen to 3.5 from 2L. On his admission in September he was also diagnosed with NSTEMI.  At the present time patient denies chest pain, cough, fever or chills.  Denies leg pain.  On arrival of EMS patient had increased work of breathing and was initially placed on NRB at 15 L ED course and data review: BP 107/74 with pulse 62.  Afebrile with O2 sat of 100% on 4 L.  Troponin 121-130 with BNP 2080.Marland Kitchen  CBC significant for hemoglobin 11.2, down from 12.4.Creatinine 1.49 up from 0.93 about 4 weeks prior EKG, personally viewed and interpreted showing NSR at 68 with LVH and nonischemic ST-T wave changes chest x-ray showed CHF and possible pneumonia as follows: IMPRESSION: Cardiomegaly with vascular congestion, pulmonary  edema and small to moderate bilateral effusions. Basilar airspace disease may be due to atelectasis or pneumonia.   12/12: Initially admitted for acute on chronic HFrEF.  Worsening bilateral pleural effusion.  Critical care on board, his home Plavix was placed on hold and most likely will get his thoracentesis later this week. Neurology was also consulted to rule out myasthenia crisis due to worsening NIF which is now improving.  Less likely a myasthenia crisis and no IVIG needed at this time.  See neurology note in detail. Currently seems stable at his baseline oxygen requirement of 2 to 3 L.  Unable to tolerate BiPAP mask, pulmonology is recommending using nasal prongs to continue BiPAP at night as it is improving NIF.   Patient is very high risk for deterioration and mortality based on age and underlying significant comorbidities.  Currently full code.  Palliative care was also consulted to discuss goals of care.   12/13: Remained hemodynamically stable and muscle strength continue to improve.  Discussed with patient and daughter-in-law about the CODE STATUS, patient wants to be DNR and DNI, apparently his daughter insisted on changing him to full code.  Patient is alert and oriented and can make his own decisions. CODE STATUS changed to DNR.  Pending rest of the goals of care discussion with palliative care today. Patient would like to go back home with home health along with son and DIL help. PT/OT evaluation ordered.   12/14: Patient was stable during morning rounds, walking around the nursing station.  Repeat chest x-ray with improvement in aeration  and pleural effusion.  Pulmonology decided not to do thoracentesis.  He will still go home.  Later became more tachypneic and disoriented.  Palliative care revisited as family now would like to go home with hospice.  Renal functions continue to improve with mild hypokalemia which is being repleted.  12/15: Patient remained stable and feeling much  improved.  He will continue on 3 L of oxygen all the time. He has been started on torsemide instead of home Lasix.  He will also continue on midodrine to support the blood pressure while being diuresed.  Patient is being discharged with hospice at home for further management. He will continue on current medications and need to have a close follow-up with his providers for further care.   Consultants: Cardiology, neurology, PCCM Procedures performed: None Disposition: Hospice care Diet recommendation:  Discharge Diet Orders (From admission, onward)     Start     Ordered   09/12/22 0000  Diet - low sodium heart healthy        09/12/22 1209           Cardiac and Carb modified diet DISCHARGE MEDICATION: Allergies as of 09/12/2022       Reactions   Beta Adrenergic Blockers Other (See Comments)   Beta Blocker will precipitate acute weakness in Lambert-Eaton Syndrome or Myasthenia Gravis   Calcium Channel Blockers Other (See Comments)   Calcium Channel Blocker- will precipitate acute weakness in Lambert-Eaton Syndrome.   Diltiazem Other (See Comments)   Ciprofloxacin Other (See Comments)   Last resort due to Myasthia Gravis   Sulfa Antibiotics Rash        Medication List     STOP taking these medications    clopidogrel 75 MG tablet Commonly known as: PLAVIX   furosemide 40 MG tablet Commonly known as: LASIX   hydroxypropyl methylcellulose / hypromellose 2.5 % ophthalmic solution Commonly known as: ISOPTO TEARS / GONIOVISC       TAKE these medications    ARTHRITIS PAIN PO Take 650 mg by mouth.   aspirin 81 MG tablet Take 81 mg by mouth daily.   docusate sodium 100 MG capsule Commonly known as: COLACE Take 1 capsule (100 mg total) by mouth 2 (two) times daily as needed for mild constipation.   feeding supplement Liqd Take 237 mLs by mouth 3 (three) times daily between meals.   finasteride 5 MG tablet Commonly known as: PROSCAR Take 5 mg by mouth  daily.   hydrocortisone 25 MG suppository Commonly known as: ANUSOL-HC Place 25 mg rectally 2 (two) times daily as needed.   midodrine 5 MG tablet Commonly known as: PROAMATINE Take 1 tablet (5 mg total) by mouth 3 (three) times daily with meals.   mycophenolate 250 MG capsule Commonly known as: CELLCEPT Take 1,000 mg by mouth 2 (two) times daily.   polyethylene glycol 17 g packet Commonly known as: MIRALAX / GLYCOLAX Take 17 g by mouth daily as needed for moderate constipation.   pravastatin 20 MG tablet Commonly known as: PRAVACHOL Take 1 tablet (20 mg total) by mouth daily.   pyridostigmine 60 MG tablet Commonly known as: MESTINON Take 30 mg by mouth 6 (six) times daily.   tacrolimus 0.1 % ointment Commonly known as: Protopic Apply topically 2 (two) times daily.   tamsulosin 0.4 MG Caps capsule Commonly known as: FLOMAX Take 0.4 mg by mouth daily.   torsemide 20 MG tablet Commonly known as: DEMADEX Take 2 tablets (40 mg total) by mouth daily.  Vitamin D (Ergocalciferol) 1.25 MG (50000 UNIT) Caps capsule Commonly known as: DRISDOL Take 1 capsule (50,000 Units total) by mouth every 7 (seven) days. Start taking on: September 15, 2022               Discharge Care Instructions  (From admission, onward)           Start     Ordered   09/12/22 0000  No dressing needed        09/12/22 1209            Follow-up Information     Paraschos, Sheppard Coil, MD. Go in 1 week(s).   Specialty: Cardiology Contact information: [facility] [place] [address] [phone]         [name], MD. Schedule an appointment as soon as possible for a visit in 1 week(s).   Specialty: Internal Medicine Contact information: [address] [place] [address] [phone]                Discharge Exam: Danley Danker Weights   09/05/22 2255 09/08/22 1900 09/09/22 0500  Weight: 81.7 kg 89 kg  86.7 kg   General.  Frail gentleman, in no acute distress. Pulmonary.  Lungs clear bilaterally, normal respiratory effort.  Decreased breath sound at bases CV.  Regular rate and rhythm, no JVD, rub or murmur. Abdomen.  Soft, nontender, nondistended, BS positive. CNS.  Alert and oriented .  No focal neurologic deficit. Extremities.  No edema, no cyanosis, pulses intact and symmetrical. Psychiatry.  Judgment and insight appears normal.   Condition at discharge: stable  The results of significant diagnostics from this hospitalization (including imaging, microbiology, ancillary and laboratory) are listed below for reference.   Imaging Studies: DG Chest 2 View  Result Date: 09/11/2022 CLINICAL DATA:  Pleural effusion EXAM: CHEST - 2 VIEW COMPARISON:  None Available. FINDINGS: Normal cardiac silhouette. Bilateral pleural effusions. Improvement in bilateral lower lobe airspace disease compared to prior. Upper lungs clear. No pneumothorax. No acute osseous abnormality. IMPRESSION: Bilateral pleural effusions. Improvement in lower lobe airspace disease seen on prior. Electronically Signed   By: Suzy Bouchard M.D.   On: 09/11/2022 11:38   DG Chest Port 1 View  Result Date: 09/09/2022 CLINICAL DATA:  Acute respiratory failure EXAM: PORTABLE CHEST 1 VIEW COMPARISON:  09/08/2022 FINDINGS: Single frontal view of the chest demonstrates stable enlargement of the cardiac silhouette. Continued central vascular congestion, bibasilar airspace disease, and bilateral pleural effusions. No pneumothorax. No acute bony abnormalities. IMPRESSION: 1. Stable constellation of findings most consistent with congestive heart failure. Superimposed pneumonia cannot be excluded. Electronically Signed   By: Randa Ngo M.D.   On: 09/09/2022 17:53   DG Chest Port 1 View  Result Date: 09/08/2022 CLINICAL DATA:  Shortness of breath. EXAM: PORTABLE CHEST 1 VIEW COMPARISON:  September 05, 2022. FINDINGS: Stable cardiomegaly.  Stable bilateral pleural effusions are noted with associated bibasilar atelectasis or edema. Bony thorax is unremarkable. IMPRESSION: Stable cardiomegaly with central pulmonary vascular congestion. Stable bilateral pleural effusions with associated bibasilar atelectasis or pneumonia. Aortic Atherosclerosis (ICD10-I70.0). Electronically Signed   By: Marijo Conception M.D.   On: 09/08/2022 15:38   DG Chest 2 View  Result Date: 09/05/2022 CLINICAL DATA:  Shortness of breath EXAM: CHEST - 2 VIEW COMPARISON:  09/29/2021, 08/04/2022 FINDINGS: Small moderate bilateral effusions. Cardiomegaly with vascular congestion and pulmonary edema. Basilar airspace disease. Aortic atherosclerosis. IMPRESSION: Cardiomegaly with vascular congestion, pulmonary edema and small to moderate  bilateral effusions. Basilar airspace disease may be due to atelectasis or pneumonia. Electronically Signed   By: Donavan Foil M.D.   On: 09/05/2022 23:16    Microbiology: Results for orders placed or performed during the hospital encounter of 09/06/22  Resp Panel by RT-PCR (Flu A&B, Covid) Anterior Nasal Swab     Status: None   Collection Time: 09/05/22 10:59 PM   Specimen: Anterior Nasal Swab  Result Value Ref Range Status   SARS Coronavirus 2 by RT PCR NEGATIVE NEGATIVE Final    Comment: (NOTE) SARS-CoV-2 target nucleic acids are NOT DETECTED.  The SARS-CoV-2 RNA is generally detectable in upper respiratory specimens during the acute phase of infection. The lowest concentration of SARS-CoV-2 viral copies this assay can detect is 138 copies/mL. A negative result does not preclude SARS-Cov-2 infection and should not be used as the sole basis for treatment or other patient management decisions. A negative result may occur with  improper specimen collection/handling, submission of specimen other than nasopharyngeal swab, presence of viral mutation(s) within the areas targeted by this assay, and inadequate number of  viral copies(<138 copies/mL). A negative result must be combined with clinical observations, patient history, and epidemiological information. The expected result is Negative.  Fact Sheet for Patients:  EntrepreneurPulse.com.au  Fact Sheet for Healthcare Providers:  IncredibleEmployment.be  This test is no t yet approved or cleared by the [place] FDA and  has been authorized for detection and/or diagnosis of SARS-CoV-2 by FDA under an Emergency Use Authorization (EUA). This EUA will remain  in effect (meaning this test can be used) for the duration of the COVID-19 declaration under Section 564(b)(1) of the Act, 21 U.S.C.section 360bbb-3(b)(1), unless the authorization is terminated  or revoked sooner.       Influenza A by PCR NEGATIVE NEGATIVE Final   Influenza B by PCR NEGATIVE NEGATIVE Final    Comment: (NOTE) The Xpert Xpress SARS-CoV-2/FLU/RSV plus assay is intended as an aid in the diagnosis of influenza from Nasopharyngeal swab specimens and should not be used as a sole basis for treatment. Nasal washings and aspirates are unacceptable for Xpert Xpress SARS-CoV-2/FLU/RSV testing.  Fact Sheet for Patients: EntrepreneurPulse.com.au  Fact Sheet for Healthcare Providers: IncredibleEmployment.be  This test is not yet approved or cleared by the [place] FDA and has been authorized for detection and/or diagnosis of SARS-CoV-2 by FDA under an Emergency Use Authorization (EUA). This EUA will remain in effect (meaning this test can be used) for the duration of the COVID-19 declaration under Section 564(b)(1) of the Act, 21 U.S.C. section 360bbb-3(b)(1), unless the authorization is terminated or revoked.  Performed at [facility], [address] [place], [place] [address]   MRSA Next Gen by PCR, Nasal     Status: None   Collection Time: 09/08/22  7:14 PM   Specimen: Nasal Mucosa;  Nasal Swab  Result Value Ref Range Status   MRSA by PCR Next Gen NOT DETECTED NOT DETECTED Final    Comment: (NOTE) The GeneXpert MRSA Assay (FDA approved for NASAL specimens only), is one component of a comprehensive MRSA colonization surveillance program. It is not intended to diagnose MRSA infection nor to guide or monitor treatment for MRSA infections. Test performance is not FDA approved in patients less than 39 years old. Performed at [facility], [address] [place], [place] [address]     Labs: CBC: Recent Labs  Lab 09/07/22 0630 09/08/22 0607 09/09/22 0502 09/10/22 0259 09/11/22 0417  WBC 6.1 5.1  5.5 5.4 6.1  HGB 10.8* 11.0* 11.1* 11.1* 10.9*  HCT 36.7* 36.7* 37.0* 38.4* 36.7*  MCV 95.6 94.8 96.9 96.7 95.6  PLT 188 184 152 170 878   Basic Metabolic Panel: Recent Labs  Lab 09/06/22 1410 09/07/22 0630 09/08/22 0607 09/09/22 0502 09/10/22 0259 09/11/22 0417  NA 140 141 141 141 143 141  K 4.2 3.5 3.7 3.5 3.7 3.3*  CL 99 100 97* 98 100 97*  CO2 34* 33* 34* 37* 36* 35*  GLUCOSE 134* 119* 104* 123* 119* 115*  BUN 50* 54* 54* 51* 48* 42*  CREATININE 1.40* 1.23 1.26* 1.15 1.04 0.90  CALCIUM 8.8* 8.7* 8.7* 8.4* 8.8* 8.7*  MG 2.3 2.4 2.4 2.3  --   --   PHOS 3.6 3.7 3.4 3.6  --   --    Liver Function Tests: No results for input(s): "AST", "ALT", "ALKPHOS", "BILITOT", "PROT", "ALBUMIN" in the last 168 hours. CBG: Recent Labs  Lab 09/08/22 1909 09/09/22 1509  GLUCAP 175* 127*    Discharge time spent: greater than 30 minutes.  This record has been created using Systems analyst. Errors have been sought and corrected,but may not always be located. Such creation errors do not reflect on the standard of care.   Signed: Lorella Nimrod, MD [facility] 09/12/2022

## 2022-09-12 NOTE — Progress Notes (Signed)
Occupational Therapy Treatment Patient Details Name: [name] MRN: [id number] DOB: [birth year] Today's Date: 09/12/2022   History of present illness 88 year old male presenting to the ED with shortness of breath, orthopnea and lower extremity edema, admitted with acute on chronic HFrEF.  Worsening bilateral pleural effusion; potential planned thoracentesis; PMH significant for CAD, HTN, BPH, Lambert-Eaton myasthenic syndrome, systolic CHF (EF 25 to 83% 05/2022) on home O2 at 2L   OT comments  [name] was seen for OT treatment on this date. Upon arrival to room pt seated in chair, agreeable to tx. Pt requires SUPERVISION + RW tooth brushing standing sink side and functional mobility ~200 ft with 1 seated rest break. SpO2 95% on 3L [place] during mobility, desat 87% on 2L [place]. Pt making good progress toward goals, will continue to follow POC. Discharge recommendation remains appropriate.     Recommendations for follow up therapy are one component of a multi-disciplinary discharge planning process, led by the attending physician.  Recommendations may be updated based on patient status, additional functional criteria and insurance authorization.    Follow Up Recommendations  Home health OT     Assistance Recommended at Discharge Frequent or constant Supervision/Assistance  Patient can return home with the following  A little help with walking and/or transfers;A little help with bathing/dressing/bathroom;Assistance with cooking/housework;Assist for transportation;Help with stairs or ramp for entrance   Equipment Recommendations  None recommended by OT    Recommendations for Other Services      Precautions / Restrictions Precautions Precautions: Fall Restrictions Weight Bearing Restrictions: No       Mobility Bed Mobility               General bed mobility comments: in recliner at start/end of session    Transfers Overall transfer level: Needs assistance Equipment used:  Rolling walker (2 wheels) Transfers: Sit to/from Stand Sit to Stand: Supervision                 Balance Overall balance assessment: Needs assistance Sitting-balance support: Feet supported Sitting balance-Leahy Scale: Good     Standing balance support: During functional activity, No upper extremity supported Standing balance-Leahy Scale: Good                             ADL either performed or assessed with clinical judgement   ADL Overall ADL's : Needs assistance/impaired                                       General ADL Comments: SUPERVISION + RW tooth brushing standing sink side and functional mobility ~200 ft with 1 seated rest break      Cognition Arousal/Alertness: Awake/alert Behavior During Therapy: WFL for tasks assessed/performed Overall Cognitive Status: Within Functional Limits for tasks assessed                                                General Comments SpO2 95% on 3L [place] during mobility, desat 87% on 2L [place]    Pertinent Vitals/ Pain       Pain Assessment Pain Assessment: No/denies pain   Frequency  Min 2X/week        Progress Toward Goals  OT Goals(current goals  can now be found in the care plan section)  Progress towards OT goals: Progressing toward goals  Acute Rehab OT Goals Patient Stated Goal: to go home OT Goal Formulation: With patient/family Time For Goal Achievement: 09/24/22 Potential to Achieve Goals: Good ADL Goals Pt Will Perform Grooming: with modified independence;sitting;standing Pt Will Perform Lower Body Dressing: with modified independence;sit to/from stand;sitting/lateral leans Pt Will Transfer to Toilet: with modified independence;ambulating Pt Will Perform Toileting - Clothing Manipulation and hygiene: with modified independence;sitting/lateral leans;sit to/from stand  Plan Discharge plan remains appropriate;Frequency remains appropriate    Co-evaluation                  AM-PAC OT "6 Clicks" Daily Activity     Outcome Measure   Help from another person eating meals?: None Help from another person taking care of personal grooming?: None Help from another person toileting, which includes using toliet, bedpan, or urinal?: A Little Help from another person bathing (including washing, rinsing, drying)?: A Lot Help from another person to put on and taking off regular upper body clothing?: None Help from another person to put on and taking off regular lower body clothing?: A Lot 6 Click Score: 19    End of Session    OT Visit Diagnosis: Unsteadiness on feet (R26.81);Muscle weakness (generalized) (M62.81)   Activity Tolerance Patient tolerated treatment well   Patient Left in chair;with call bell/phone within reach;with family/visitor present   Nurse Communication          Time: 5427-0623 OT Time Calculation (min): 28 min  Charges: OT General Charges $OT Visit: 1 Visit OT Treatments $Self Care/Home Management : 8-22 mins $Therapeutic Activity: 8-22 mins  Dessie Coma, M.S. OTR/L  09/12/22, 12:32 PM  ascom [phone]

## 2022-09-15 ENCOUNTER — Telehealth: Payer: Self-pay | Admitting: Internal Medicine

## 2022-09-15 ENCOUNTER — Other Ambulatory Visit: Payer: Self-pay

## 2022-09-15 NOTE — Telephone Encounter (Signed)
Drue Dun has been informed and Dr. Nicki Reaper will be the attending MD.

## 2022-09-15 NOTE — Telephone Encounter (Signed)
Ok to give order for hospice referral and I am ok with being the attending.

## 2022-09-15 NOTE — Telephone Encounter (Signed)
[name] from [facility] called wanting to know if the provider will agree to a hospice referral and be the attending provider because the pt was in the hospital last week.

## 2022-09-16 ENCOUNTER — Encounter: Payer: Medicare Other | Admitting: Occupational Therapy

## 2022-09-16 ENCOUNTER — Encounter: Payer: Medicare Other | Admitting: Family

## 2022-09-18 ENCOUNTER — Encounter: Payer: Medicare Other | Admitting: Occupational Therapy

## 2022-09-30 ENCOUNTER — Encounter: Payer: Medicare Other | Admitting: Occupational Therapy

## 2022-10-02 ENCOUNTER — Encounter: Payer: Medicare Other | Admitting: Occupational Therapy

## 2022-10-07 ENCOUNTER — Encounter: Payer: Medicare Other | Admitting: Occupational Therapy

## 2022-10-09 ENCOUNTER — Ambulatory Visit (INDEPENDENT_AMBULATORY_CARE_PROVIDER_SITE_OTHER): Payer: Medicare Other | Admitting: Internal Medicine

## 2022-10-09 ENCOUNTER — Encounter: Payer: Medicare Other | Admitting: Occupational Therapy

## 2022-10-09 DIAGNOSIS — I10 Essential (primary) hypertension: Secondary | ICD-10-CM

## 2022-10-09 NOTE — Progress Notes (Deleted)
Subjective:    Patient ID: [name], male    DOB: 1926/08/12, 89 y.o.   MRN: [id number]  Patient here for No chief complaint on file.   HPI Here for scheduled follow up.  Admitted 09/06/22 - 09/12/22 - SOB. Diagnoesd - acute on chronic systolic CHF.  Initially required increased oxygen.  Discharged on 3 liters O2.  Diuresed.  Discharged on torsemide and midodrine.   Was decided to discharge with hospice.     Past Medical History:  Diagnosis Date   Abnormal chest CT 03/28/2017   Allergy    Anemia    Angina pectoris ([place]) 04/02/2011   Aortic aneurysm ([place]) 03/28/2017   Saw Dr Genevive Bi 04/16/17 - recommended f/u CT in 3 months.     Atherosclerosis of both carotid arteries 07/17/2014   Basal cell carcinoma 06/25/2021   Left ant neck. Nodulocystic pattern, close to margin   Benign lipomatous neoplasm of skin and subcutaneous tissue of left arm 10/04/2013   Bilateral carotid artery stenosis 08/30/2014   Overview:  Less than 50% 2015   Bladder cancer ([place]) 03/05/2013   CAD (coronary artery disease) 06/18/2014   Cancer ([place]) 11/24/2012   bladder. BCG treatments by Dr Jacqlyn Larsen.   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 WD SCC. Re-shaved  01/14/2016. Excised 02/05/2016, margins free.   Squamous cell carcinoma of skin 11/03/2018   Right cheek ant. to sideburn. Poorly differentiated.   Squamous cell carcinoma of skin 02/02/2019   Right mid dorsum forearm. WD SCC. EDC.   Past Surgical History:  Procedure Laterality Date   [place]   Family History  Problem Relation Age of Onset   Lung cancer Sister    Prostate cancer Brother    Lung cancer Brother    Arthritis Other        parent   Colon cancer Neg Hx    Social History   Socioeconomic History   Marital status: Widowed    Spouse name: Not on file   Number of children: Not on file   Years of education: Not on file   Highest education level: Not on file  Occupational History   Not on file  Tobacco Use   Smoking status: Former    Types: Cigarettes    Quit date: 09/29/1958    Years since quitting: 64.0   Smokeless tobacco: Never  Vaping Use   Vaping Use: Never used  Substance and Sexual Activity   Alcohol use: Yes    Alcohol/week: 0.0 standard drinks of alcohol    Comment: occasionally   Drug use: No   Sexual activity: Never  Other  Topics Concern   Not on file  Social History Narrative   Pt is married with 2 children - 1 son, 1 daughter. Previously self-employed in [employer]   Social Determinants of Health   Financial Resource Strain: [place]  (11/19/2021)   Overall Financial Resource Strain (CARDIA)    Difficulty of Paying Living Expenses: Not hard at all  Food Insecurity: No Food Insecurity (09/07/2022)   Hunger Vital Sign    Worried About Running Out of Food in the Last Year: Never true    Ran Out of Food in the Last Year: Never true  Transportation Needs: No Transportation Needs (09/07/2022)   PRAPARE - Hydrologist (Medical): No    Lack of Transportation (Non-Medical): No  Physical Activity: Not on file  Stress: No Stress Concern  Present (11/19/2021)   [place]    Feeling of Stress : Not at all  Social Connections: Unknown (11/19/2021)   Social Connection and Isolation Panel [NHANES]    Frequency of Communication with Friends and Family: More than three times a week    Frequency of Social Gatherings with Friends and Family: More than three times a week    Attends Religious Services: More than 4 times per year    Active Member of [employer] or Organizations: Not on file    Attends Archivist Meetings: Not on file    Marital Status: Widowed     Review of Systems     Objective:     There were no vitals taken for this visit. Wt Readings from Last 3 Encounters:  09/09/22 191 lb 2.2 oz (86.7 kg)  08/08/22 180 lb (81.6 kg)  08/07/22 184 lb 11.9 oz (83.8 kg)    Physical Exam   Outpatient Encounter Medications as of 10/09/2022  Medication Sig   Acetaminophen (ARTHRITIS PAIN PO) Take 650 mg by mouth.   aspirin 81 MG tablet Take 81 mg by mouth daily.   docusate sodium (COLACE) 100 MG capsule Take 1 capsule (100 mg total) by mouth 2 (two) times daily as needed for mild constipation.   feeding supplement (ENSURE ENLIVE / ENSURE PLUS) LIQD Take 237 mLs by mouth 3 (three) times daily between meals.   finasteride (PROSCAR) 5 MG tablet Take 5 mg by mouth daily.   hydrocortisone (ANUSOL-HC) 25 MG suppository Place 25 mg rectally 2 (two) times daily as needed.   midodrine (PROAMATINE) 5 MG tablet Take 1 tablet (5 mg total) by mouth 3 (three) times daily with meals.   mycophenolate (CELLCEPT) 250 MG capsule Take 1,000 mg by mouth 2 (two) times daily.    polyethylene glycol (MIRALAX / GLYCOLAX) 17 g packet Take 17 g by mouth daily as needed for moderate constipation.   pravastatin (PRAVACHOL) 20 MG tablet Take 1 tablet (20 mg total) by mouth daily.   pyridostigmine (MESTINON) 60 MG tablet Take 30 mg by mouth 6 (six) times daily.    tacrolimus  (PROTOPIC) 0.1 % ointment Apply topically 2 (two) times daily.   tamsulosin (FLOMAX) 0.4 MG CAPS Take 0.4 mg by mouth daily.   torsemide (DEMADEX) 20 MG tablet Take 2 tablets (40 mg total) by mouth daily.   Vitamin D, Ergocalciferol, (DRISDOL) 1.25 MG (50000 UNIT) CAPS capsule Take 1 capsule (50,000 Units total) by mouth every 7 (seven) days.   No facility-administered encounter medications on file as of 10/09/2022.     Lab Results  Component Value  Date   WBC 6.1 09/11/2022   HGB 10.9 (L) 09/11/2022   HCT 36.7 (L) 09/11/2022   PLT 173 09/11/2022   GLUCOSE 115 (H) 09/11/2022   CHOL 134 06/16/2022   TRIG 65 06/16/2022   HDL 49 06/16/2022   LDLDIRECT 143.5 08/26/2013   LDLCALC 72 06/16/2022   ALT 13 08/08/2022   AST 25 08/08/2022   NA 141 09/11/2022   K 3.3 (L) 09/11/2022   CL 97 (L) 09/11/2022   CREATININE 0.90 09/11/2022   BUN 42 (H) 09/11/2022   CO2 35 (H) 09/11/2022   TSH 1.543 09/29/2021   INR 1.2 06/14/2022   HGBA1C 5.5 06/16/2022    No results found.     Assessment & Plan:  There are no diagnoses linked to this encounter.   [name], MD

## 2022-10-10 ENCOUNTER — Encounter: Payer: Self-pay | Admitting: Internal Medicine

## 2022-10-10 NOTE — Progress Notes (Signed)
Patient ID: [name], male   DOB: 10/01/1925, 89 y.o.   MRN: [id number] Did not show for appt.  Discharged from hospital.  Home with hospice.

## 2022-11-10 ENCOUNTER — Telehealth: Payer: Self-pay | Admitting: Internal Medicine

## 2022-11-10 NOTE — Telephone Encounter (Signed)
Left message for patient to call back and schedule Medicare Annual Wellness Visit (AWV).   Please offer to do by telephone.  Left office number and my jabber [phone].  Last AWV:11/19/2021   Please schedule at anytime with Nurse Health Advisor.

## 2022-11-20 ENCOUNTER — Telehealth: Payer: Self-pay | Admitting: Internal Medicine

## 2022-11-20 NOTE — Telephone Encounter (Signed)
Edema to lower extremities- persistent.   Prescribed torsemide 40 mg q day. Family states was instructed when his weight was up to take extra torsemide. So he has been taking 60 mg torsemide daily. Patient weighs daily and is typically 184-185 but weighed 189 today. Patient denies SOB. Pt normally walks back and forth to the bathroom but for the last week or so he has been using his urinal instead of getting up to go to the bathroom. He is elevating his legs. Overall looks ok and doing ok per Rusty. Seems to be getting more tired on exertion but fine at rest. BP was 122/84. Pt has agreed to let [facility] order a hospital bed.   Rusty was just wanting to know if you wanted him to increase his torsemide with the weight gain or just monitor.

## 2022-11-20 NOTE — Telephone Encounter (Signed)
Rusty from [facility] called stating pt has epiderma to his lower extremities and he is taking torsemide 89m 1x a day. Rusty stated pt fluid is getting worse and his weight is going up. Pt BP is 122/84 and weight is 189. Rusty does not want to know if scott want to increase the medication-torsemide

## 2022-11-20 NOTE — Telephone Encounter (Signed)
[name] aware of below. Will have him increase to 80 mg q day for 3 days since he was taking 71m once daily then he will return to the normal dose of 60 mg q day. [name] will monitor and call with update.  Dr SNicki Reaperis aware that he will be taking 80 mg q day instead of 40 mg bid.

## 2022-11-20 NOTE — Telephone Encounter (Signed)
If has gained 5 pounds in < 1 week, can adjust torsemide temporarily.  Can increase to 64m bid x 3 days and then can return to the total of 657mper day.  Monitor.  Also make sure monitoring sodium intake (decrease sodium intake).  Agree with hospital bed. Will probably be easier to elevate legs.  Call if problems.

## 2022-12-03 ENCOUNTER — Ambulatory Visit: Payer: Medicare Other | Admitting: Dermatology

## 2022-12-03 ENCOUNTER — Telehealth: Payer: Self-pay | Admitting: Internal Medicine

## 2022-12-03 NOTE — Telephone Encounter (Signed)
Contacted [name] to schedule their annual wellness visit. Patient declined to schedule AWV at this time.  Patient's daughter, [name], declined AWV.  She said patient has Hospice.  Thank you,  [place] Direct dial  [phone]

## 2022-12-08 ENCOUNTER — Ambulatory Visit: Payer: Medicare Other | Admitting: Podiatry

## 2022-12-22 ENCOUNTER — Telehealth: Payer: Self-pay | Admitting: Internal Medicine

## 2022-12-22 NOTE — Telephone Encounter (Signed)
Spoke with patients daughter. She is faxing over paperwork for [place] to complete for her to be able to take time off to be at home with her dad. He is still doing ok but just noticing gradual changes. He has had intermittent confusion. Not wanting to get up and do a lot. Hospice is coming out 2-3 times a week and patient is still eating and drinking ok. Advised daughter would let her know when we have received her paperwork

## 2022-12-22 NOTE — Telephone Encounter (Signed)
Pt daughter is faxing papers for FLMA for pt and states it is urgent because the pt is taking a turn for the worse. pt daughter wants a call back  [phone]

## 2022-12-29 ENCOUNTER — Telehealth: Payer: Self-pay | Admitting: Internal Medicine

## 2022-12-30 NOTE — Telephone Encounter (Signed)
Spoke with daughter to discuss paperwork. The paperwork was completed prior to patient passing away. Dates are listed 12/22/2022-03/29/23. Daughter stated that these dates are okay because she is the executor of the estate for her dad and will need time off work outside of her bereavement to take care of all this. She also noted that with her job she was only allowed a certain amount of family leave hours with out filing for FMLA and is going to need time off with her daughters having surgery in the upcoming months so this paperwork will cover some of the hours that she had to use with her dad prior to his passing. Just wanted to confirm ok with you to send paper work as written initially.

## 2022-12-31 ENCOUNTER — Telehealth: Payer: Self-pay | Admitting: Internal Medicine

## 2022-12-31 NOTE — Telephone Encounter (Signed)
Patient has been marked as deceased

## 2022-12-31 NOTE — Telephone Encounter (Signed)
Death certificate has been signed.  Please change status to decease

## 2022-12-31 NOTE — Telephone Encounter (Signed)
The paperwork that I completed was for time off to take care of her father.  The information on the form is not accurate now for her current needs.  If she is needing time off now for her daughter's surgery,etc  - to have proper documentation - she probably needs to get a new form and have her physician complete.  Let me know if any questions or problems.

## 2022-12-31 NOTE — Telephone Encounter (Signed)
Caller Name: D from [facility] home Phone #:   [phone]  Date of death: [id number] Place of death: residence  Time of death: 11:05 am            (Please indicate AM/PM)    Autopsy performed: no Funeral Home: [facility]  Case # [id number]   D called in staying they need Dr. Nicki Reaper to sign death certificate.

## 2022-12-31 NOTE — Telephone Encounter (Signed)
Info for death certificate

## 2023-01-02 NOTE — Telephone Encounter (Signed)
Spoke with daughter. She does not have a physician. She is saying that this paperwork is supposed to cover for when this patient was alive. He started hospice in 2023-09-20 and died 01-03-2023. Explained to her that dates on form are still incorrect if she plans to back date the forms. Daughter says she is just trying to prevent using up all of her sick time and doesn't feel like dealing with this at this time.

## 2023-01-03 NOTE — Telephone Encounter (Signed)
See me about this.  Given that the information on the form does not apply now, if she needs a letter I can provide a letter.

## 2023-01-07 NOTE — Telephone Encounter (Signed)
Daughter is aware and will let me know if letter is needed.

## 2023-01-28 NOTE — Telephone Encounter (Signed)
LMTCB

## 2023-01-28 NOTE — Telephone Encounter (Signed)
Pt daughter called checking on FLMA paperwork  [id number] attention lisa olver Phone number  [phone]

## 2023-01-28 NOTE — Telephone Encounter (Signed)
Lorriane Shire called from Hospice to state she called this morning to request medication for patient, but patient has just died, so he no longer needs this medication.

## 2023-01-28 NOTE — Telephone Encounter (Signed)
Patient just passed away. Will reach out to daughter to discuss

## 2023-01-28 NOTE — Telephone Encounter (Signed)
Noted  

## 2023-01-28 NOTE — Telephone Encounter (Signed)
[name] from [facility], [phone]. Patient is very restless and agitated,  [name] was wondering if Dr Nicki Reaper would order Lorazepam. Please call and let her know.

## 2023-01-28 DEATH — deceased

## 2023-12-04 IMAGING — DX DG CHEST 1V
1 series · 1 of 1 positions shown · non-contrast
Comparison: [DATE] [DATE], [DATE] [DATE] p.m.

CLINICAL DATA: Shortness of breath. Patient has a history of
congestive heart failure.

EXAM:
CHEST  1 VIEW

[chest ap]
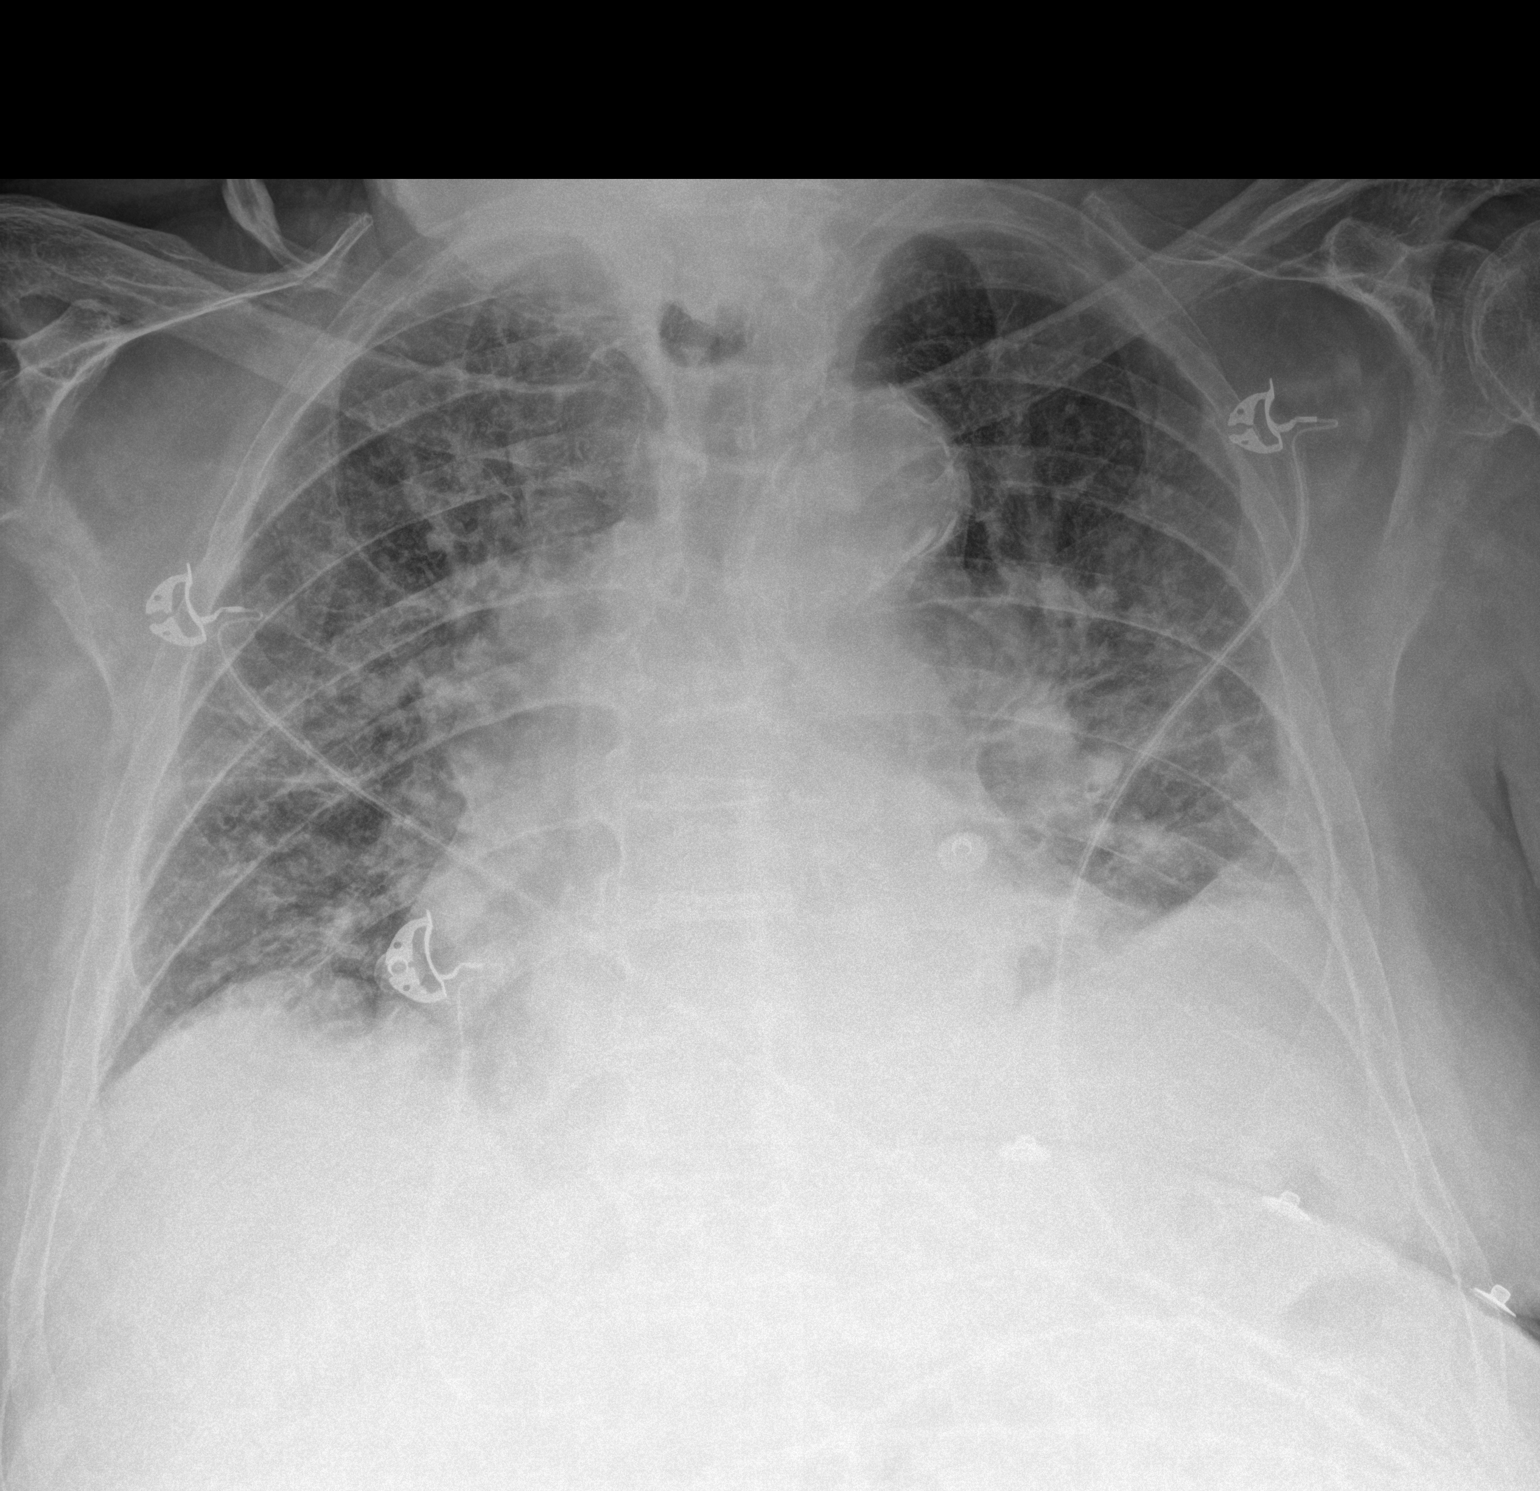

[1 of 1 positions shown; findings below may reference images not displayed]

FINDINGS: The heart size is enlarged. Mediastinal contour is stable. There is
increased pulmonary interstitium bilaterally worsened compared prior
exam. Probable small left pleural effusion is identified. Osseous
structures are stable.
IMPRESSION: Congestive heart failure worsened compared prior exam.

## 2023-12-04 IMAGING — CT CT HEAD CODE STROKE
4 series · 17 of 47 positions shown, 19 images · non-contrast
Comparison: None.

CLINICAL DATA: Code stroke.

EXAM:
CT HEAD WITHOUT CONTRAST
TECHNIQUE: Contiguous axial images were obtained from the base of the skull
through the vertex without intravenous contrast.

[Series 3: head wo · axial · 0.44mm/px · z∈[-118,+17]mm · 7 of 37 slices shown, 9 images]
[im 5/37  brain]
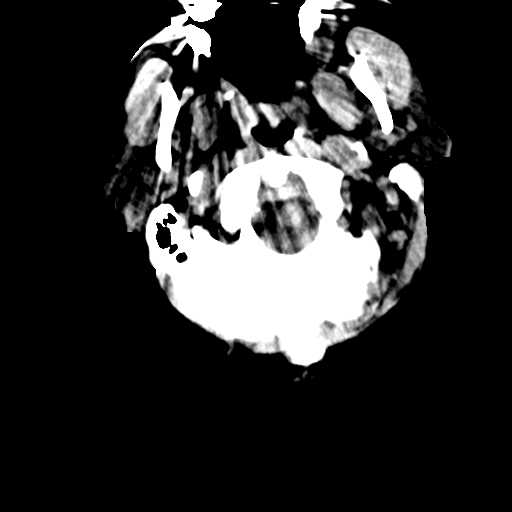
[im 5/37  bone]
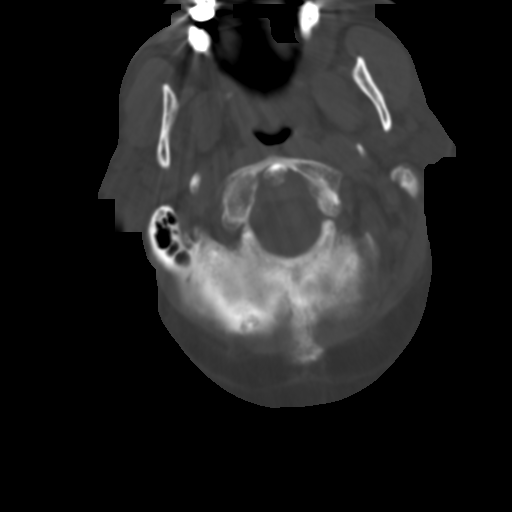
[im 10/37  brain]
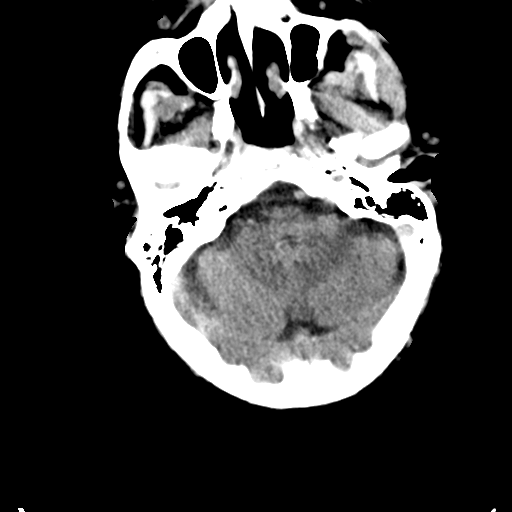
[im 14/37  brain]
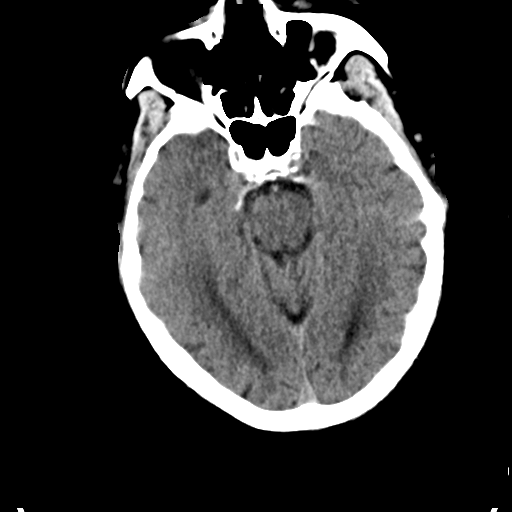
[im 19/37  brain]
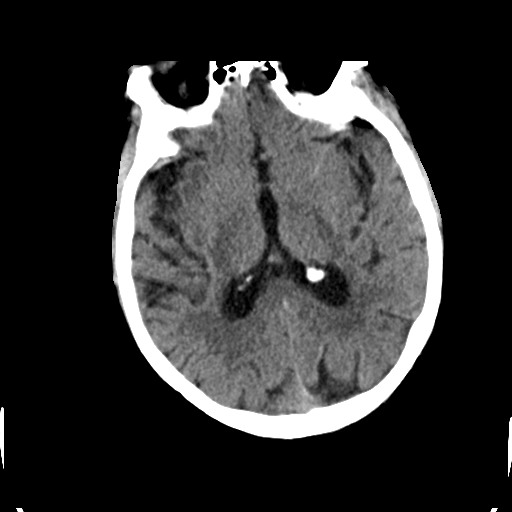
[im 23/37  brain]
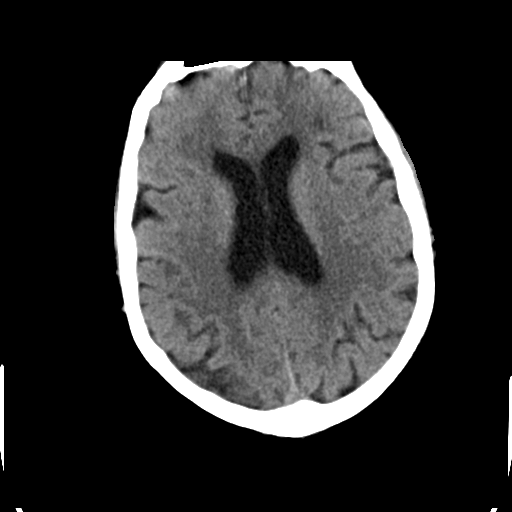
[im 23/37  bone]
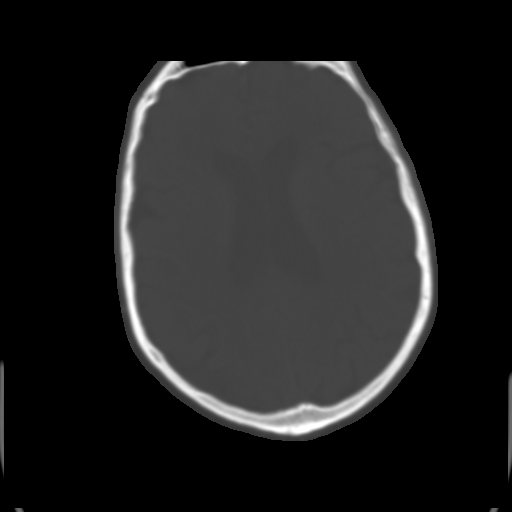
[im 28/37  brain]
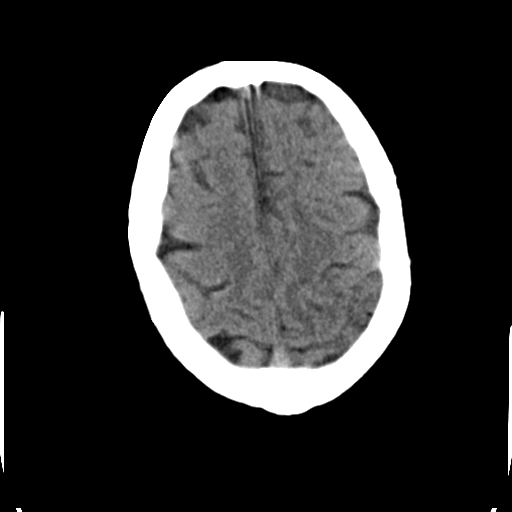
[im 32/37  brain]
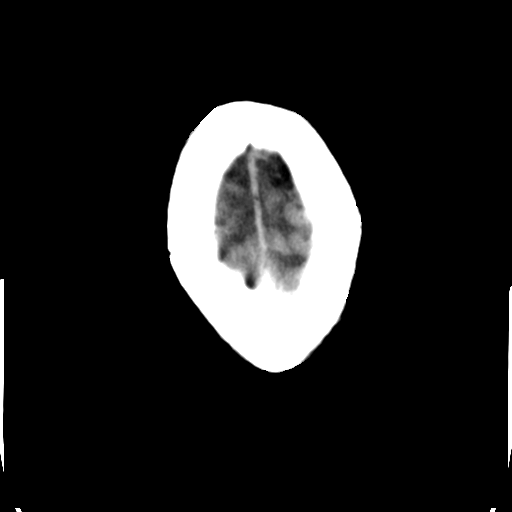

[Series 4: head bone · axial · 0.44mm/px · z∈[-120,-58]mm · 4 of 91 slices shown]
[im 10/91  bone]
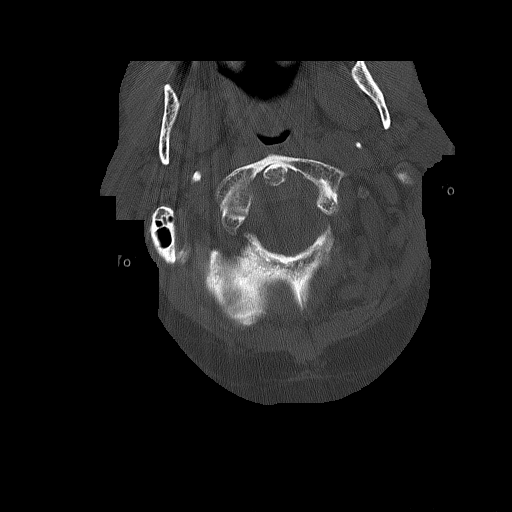
[im 19/91  bone]
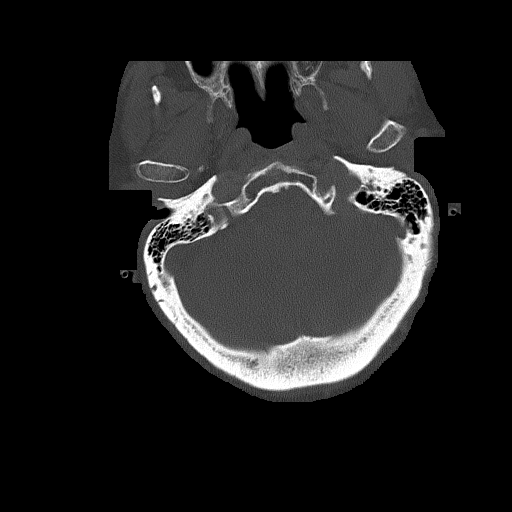
[im 28/91  bone]
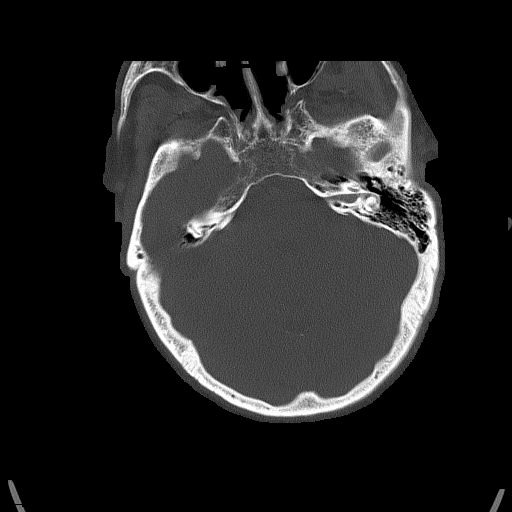
[im 41/91  bone]
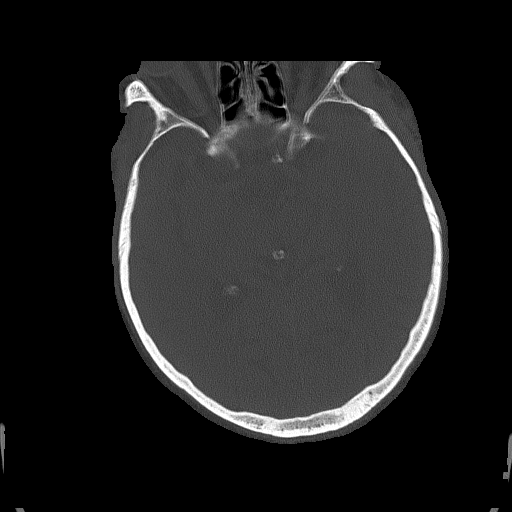

[Series 5: coronal soft tissue · coronal · 0.37mm/px · 3 of 73 slices shown]
[im 27/73  brain]
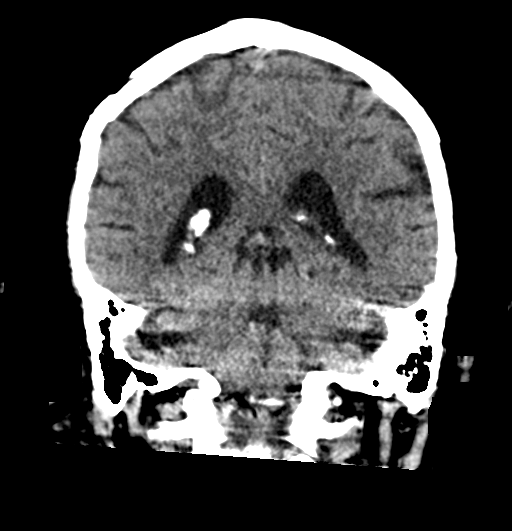
[im 33/73  brain]
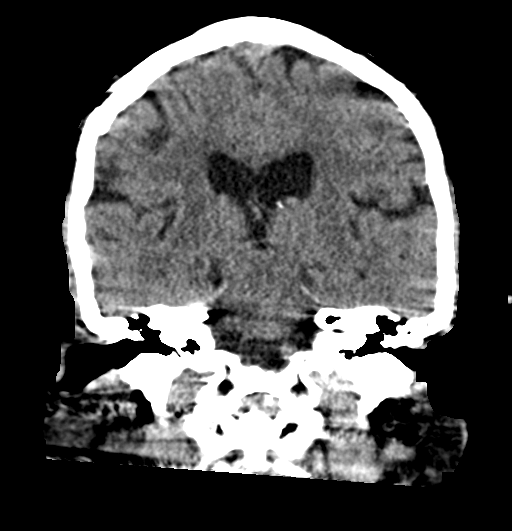
[im 40/73  brain]
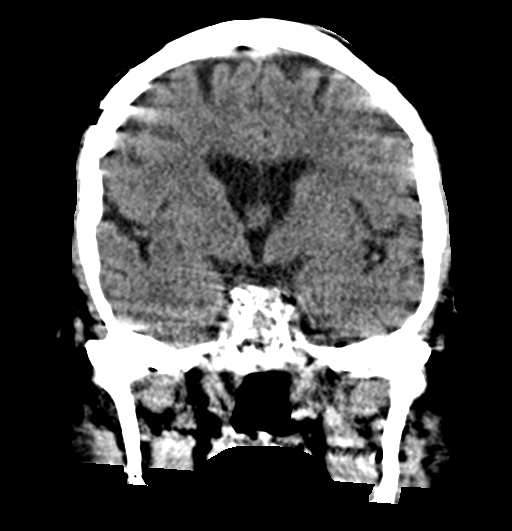

[Series 6: sagittal soft tissue · sagittal · 0.39mm/px · 3 of 61 slices shown]
[im 21/61  brain]
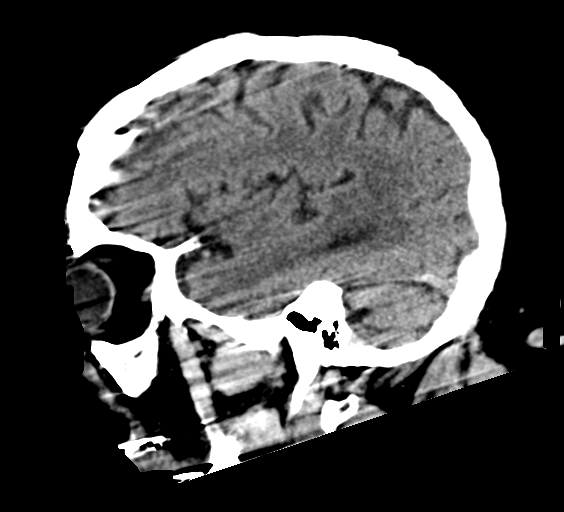
[im 31/61  brain]
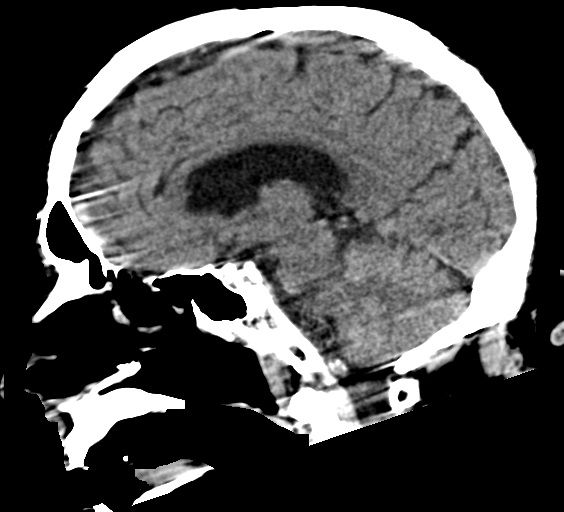
[im 41/61  brain]
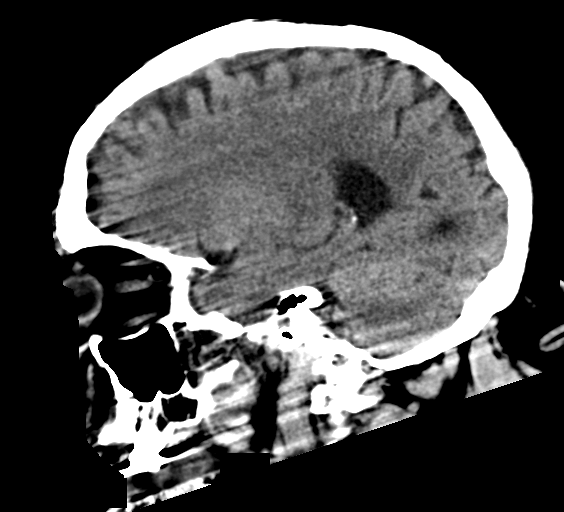

[17 of 47 positions shown; findings below may reference images not displayed]

FINDINGS: Brain: No evidence of acute infarction, hemorrhage, hydrocephalus,
extra-axial collection or mass lesion/mass effect.

Vascular: No hyperdense vessel or unexpected calcification.

Skull: Normal. Negative for fracture or focal lesion.

Sinuses/Orbits: No acute finding

Other: These results were communicated to Dr. Varma at [DATE] on
09/29/2021 by text page via the [employer] messaging system.

ASPECTS ([place] Stroke Program Early CT Score)

Not scored without localizing symptom
IMPRESSION: Motion degraded head CT without acute finding.
